# Patient Record
Sex: Female | Born: 1960 | Race: Black or African American | Hispanic: No | Marital: Single | Smoking: Former smoker
Health system: Southern US, Community
[De-identification: ages and names within clinical notes are randomized; demographics above are authoritative.]

## PROBLEM LIST (undated history)

## (undated) DIAGNOSIS — M549 Dorsalgia, unspecified: Secondary | ICD-10-CM

## (undated) DIAGNOSIS — G4730 Sleep apnea, unspecified: Secondary | ICD-10-CM

## (undated) DIAGNOSIS — M79605 Pain in left leg: Secondary | ICD-10-CM

## (undated) DIAGNOSIS — J45909 Unspecified asthma, uncomplicated: Secondary | ICD-10-CM

## (undated) DIAGNOSIS — I639 Cerebral infarction, unspecified: Secondary | ICD-10-CM

## (undated) DIAGNOSIS — F329 Major depressive disorder, single episode, unspecified: Secondary | ICD-10-CM

## (undated) DIAGNOSIS — I739 Peripheral vascular disease, unspecified: Secondary | ICD-10-CM

## (undated) DIAGNOSIS — F319 Bipolar disorder, unspecified: Secondary | ICD-10-CM

## (undated) DIAGNOSIS — F32A Depression, unspecified: Secondary | ICD-10-CM

## (undated) DIAGNOSIS — G43909 Migraine, unspecified, not intractable, without status migrainosus: Secondary | ICD-10-CM

## (undated) DIAGNOSIS — T7840XA Allergy, unspecified, initial encounter: Secondary | ICD-10-CM

## (undated) DIAGNOSIS — E119 Type 2 diabetes mellitus without complications: Secondary | ICD-10-CM

## (undated) DIAGNOSIS — I10 Essential (primary) hypertension: Secondary | ICD-10-CM

## (undated) DIAGNOSIS — F419 Anxiety disorder, unspecified: Secondary | ICD-10-CM

## (undated) DIAGNOSIS — E7800 Pure hypercholesterolemia, unspecified: Secondary | ICD-10-CM

## (undated) DIAGNOSIS — M1990 Unspecified osteoarthritis, unspecified site: Secondary | ICD-10-CM

## (undated) DIAGNOSIS — D649 Anemia, unspecified: Secondary | ICD-10-CM

## (undated) HISTORY — DX: Anemia, unspecified: D64.9

## (undated) HISTORY — PX: EYE SURGERY: SHX253

## (undated) HISTORY — DX: Peripheral vascular disease, unspecified: I73.9

## (undated) HISTORY — PX: SHOULDER SURGERY: SHX246

## (undated) HISTORY — DX: Pure hypercholesterolemia, unspecified: E78.00

## (undated) HISTORY — DX: Pain in left leg: M79.605

## (undated) HISTORY — PX: KNEE SURGERY: SHX244

## (undated) HISTORY — PX: ABDOMINAL HYSTERECTOMY: SHX81

## (undated) HISTORY — DX: Unspecified osteoarthritis, unspecified site: M19.90

## (undated) HISTORY — DX: Cerebral infarction, unspecified: I63.9

## (undated) HISTORY — DX: Sleep apnea, unspecified: G47.30

## (undated) HISTORY — DX: Dorsalgia, unspecified: M54.9

## (undated) HISTORY — PX: PARTIAL HYSTERECTOMY: SHX80

## (undated) HISTORY — PX: BACK SURGERY: SHX140

## (undated) HISTORY — DX: Allergy, unspecified, initial encounter: T78.40XA

## (undated) HISTORY — DX: Anxiety disorder, unspecified: F41.9

## (undated) HISTORY — PX: OTHER SURGICAL HISTORY: SHX169

---

## 1997-09-01 ENCOUNTER — Emergency Department (HOSPITAL_COMMUNITY): Admission: EM | Admit: 1997-09-01 | Discharge: 1997-09-01 | Payer: Self-pay | Admitting: Emergency Medicine

## 1997-09-02 ENCOUNTER — Encounter: Admission: RE | Admit: 1997-09-02 | Discharge: 1997-12-01 | Payer: Self-pay | Admitting: Internal Medicine

## 1998-03-14 ENCOUNTER — Emergency Department (HOSPITAL_COMMUNITY): Admission: EM | Admit: 1998-03-14 | Discharge: 1998-03-14 | Payer: Self-pay | Admitting: Emergency Medicine

## 1998-05-18 ENCOUNTER — Encounter: Payer: Self-pay | Admitting: Emergency Medicine

## 1998-05-18 ENCOUNTER — Emergency Department (HOSPITAL_COMMUNITY): Admission: EM | Admit: 1998-05-18 | Discharge: 1998-05-18 | Payer: Self-pay | Admitting: Emergency Medicine

## 1998-10-10 ENCOUNTER — Encounter: Payer: Self-pay | Admitting: Emergency Medicine

## 1998-10-10 ENCOUNTER — Inpatient Hospital Stay (HOSPITAL_COMMUNITY): Admission: EM | Admit: 1998-10-10 | Discharge: 1998-10-13 | Payer: Self-pay | Admitting: Emergency Medicine

## 1998-12-11 ENCOUNTER — Inpatient Hospital Stay (HOSPITAL_COMMUNITY): Admission: EM | Admit: 1998-12-11 | Discharge: 1998-12-14 | Payer: Self-pay | Admitting: *Deleted

## 2003-03-03 ENCOUNTER — Ambulatory Visit (HOSPITAL_COMMUNITY): Admission: RE | Admit: 2003-03-03 | Discharge: 2003-03-03 | Payer: Self-pay | Admitting: Internal Medicine

## 2003-09-07 ENCOUNTER — Ambulatory Visit (HOSPITAL_COMMUNITY): Admission: RE | Admit: 2003-09-07 | Discharge: 2003-09-07 | Payer: Self-pay | Admitting: Internal Medicine

## 2005-01-14 DIAGNOSIS — I639 Cerebral infarction, unspecified: Secondary | ICD-10-CM

## 2005-01-14 HISTORY — DX: Cerebral infarction, unspecified: I63.9

## 2005-07-05 ENCOUNTER — Ambulatory Visit: Payer: Self-pay | Admitting: Internal Medicine

## 2005-07-05 ENCOUNTER — Inpatient Hospital Stay (HOSPITAL_COMMUNITY): Admission: EM | Admit: 2005-07-05 | Discharge: 2005-07-12 | Payer: Self-pay | Admitting: Emergency Medicine

## 2005-07-05 ENCOUNTER — Encounter: Payer: Self-pay | Admitting: Cardiology

## 2005-07-05 ENCOUNTER — Ambulatory Visit: Payer: Self-pay | Admitting: Cardiology

## 2005-07-08 ENCOUNTER — Encounter: Payer: Self-pay | Admitting: Cardiology

## 2005-07-10 ENCOUNTER — Encounter: Admission: RE | Admit: 2005-07-10 | Discharge: 2005-07-10 | Payer: Self-pay | Admitting: *Deleted

## 2005-07-11 ENCOUNTER — Encounter (INDEPENDENT_AMBULATORY_CARE_PROVIDER_SITE_OTHER): Payer: Self-pay | Admitting: Specialist

## 2005-07-25 ENCOUNTER — Ambulatory Visit: Payer: Self-pay | Admitting: Internal Medicine

## 2005-08-14 ENCOUNTER — Ambulatory Visit: Payer: Self-pay | Admitting: Family Medicine

## 2005-08-19 ENCOUNTER — Ambulatory Visit: Payer: Self-pay | Admitting: *Deleted

## 2005-08-22 ENCOUNTER — Ambulatory Visit: Payer: Self-pay | Admitting: Nurse Practitioner

## 2005-08-28 ENCOUNTER — Ambulatory Visit: Payer: Self-pay | Admitting: Family Medicine

## 2005-09-20 ENCOUNTER — Ambulatory Visit: Payer: Self-pay | Admitting: Family Medicine

## 2005-09-20 ENCOUNTER — Ambulatory Visit (HOSPITAL_COMMUNITY): Admission: RE | Admit: 2005-09-20 | Discharge: 2005-09-20 | Payer: Self-pay | Admitting: Family Medicine

## 2005-09-29 ENCOUNTER — Emergency Department (HOSPITAL_COMMUNITY): Admission: EM | Admit: 2005-09-29 | Discharge: 2005-09-29 | Payer: Self-pay | Admitting: Emergency Medicine

## 2005-10-04 ENCOUNTER — Ambulatory Visit: Payer: Self-pay | Admitting: Family Medicine

## 2005-10-25 ENCOUNTER — Ambulatory Visit: Payer: Self-pay | Admitting: Family Medicine

## 2005-11-19 ENCOUNTER — Inpatient Hospital Stay (HOSPITAL_COMMUNITY): Admission: AD | Admit: 2005-11-19 | Discharge: 2005-11-19 | Payer: Self-pay | Admitting: Family Medicine

## 2005-12-10 ENCOUNTER — Ambulatory Visit (HOSPITAL_BASED_OUTPATIENT_CLINIC_OR_DEPARTMENT_OTHER): Admission: RE | Admit: 2005-12-10 | Discharge: 2005-12-10 | Payer: Self-pay | Admitting: Neurology

## 2005-12-17 ENCOUNTER — Ambulatory Visit: Payer: Self-pay | Admitting: Family Medicine

## 2006-02-22 ENCOUNTER — Emergency Department (HOSPITAL_COMMUNITY): Admission: EM | Admit: 2006-02-22 | Discharge: 2006-02-23 | Payer: Self-pay | Admitting: Emergency Medicine

## 2006-02-25 ENCOUNTER — Ambulatory Visit (HOSPITAL_BASED_OUTPATIENT_CLINIC_OR_DEPARTMENT_OTHER): Admission: RE | Admit: 2006-02-25 | Discharge: 2006-02-25 | Payer: Self-pay | Admitting: Orthopedic Surgery

## 2006-03-04 ENCOUNTER — Encounter: Admission: RE | Admit: 2006-03-04 | Discharge: 2006-05-02 | Payer: Self-pay | Admitting: Orthopedic Surgery

## 2006-03-12 ENCOUNTER — Encounter (INDEPENDENT_AMBULATORY_CARE_PROVIDER_SITE_OTHER): Payer: Self-pay | Admitting: Specialist

## 2006-03-12 ENCOUNTER — Inpatient Hospital Stay (HOSPITAL_COMMUNITY): Admission: RE | Admit: 2006-03-12 | Discharge: 2006-03-15 | Payer: Self-pay | Admitting: Obstetrics

## 2006-04-22 ENCOUNTER — Ambulatory Visit: Payer: Self-pay | Admitting: Gastroenterology

## 2006-05-07 ENCOUNTER — Ambulatory Visit: Payer: Self-pay | Admitting: Gastroenterology

## 2006-07-08 ENCOUNTER — Inpatient Hospital Stay (HOSPITAL_COMMUNITY): Admission: RE | Admit: 2006-07-08 | Discharge: 2006-07-11 | Payer: Self-pay | Admitting: Orthopedic Surgery

## 2006-07-09 ENCOUNTER — Ambulatory Visit: Payer: Self-pay | Admitting: Physical Medicine & Rehabilitation

## 2006-08-04 ENCOUNTER — Encounter: Admission: RE | Admit: 2006-08-04 | Discharge: 2006-08-04 | Payer: Self-pay | Admitting: Family Medicine

## 2006-08-18 ENCOUNTER — Encounter: Admission: RE | Admit: 2006-08-18 | Discharge: 2006-11-16 | Payer: Self-pay | Admitting: Orthopedic Surgery

## 2006-09-22 ENCOUNTER — Encounter: Admission: RE | Admit: 2006-09-22 | Discharge: 2006-12-21 | Payer: Self-pay | Admitting: Gastroenterology

## 2006-10-05 ENCOUNTER — Encounter: Admission: RE | Admit: 2006-10-05 | Discharge: 2006-10-05 | Payer: Self-pay | Admitting: Orthopedic Surgery

## 2006-10-21 ENCOUNTER — Encounter: Admission: RE | Admit: 2006-10-21 | Discharge: 2007-01-19 | Payer: Self-pay | Admitting: Family Medicine

## 2006-11-14 ENCOUNTER — Encounter: Admission: RE | Admit: 2006-11-14 | Discharge: 2006-11-14 | Payer: Self-pay | Admitting: Orthopedic Surgery

## 2006-12-02 ENCOUNTER — Emergency Department (HOSPITAL_COMMUNITY): Admission: EM | Admit: 2006-12-02 | Discharge: 2006-12-02 | Payer: Self-pay | Admitting: Emergency Medicine

## 2007-01-14 ENCOUNTER — Emergency Department (HOSPITAL_COMMUNITY): Admission: EM | Admit: 2007-01-14 | Discharge: 2007-01-14 | Payer: Self-pay | Admitting: Emergency Medicine

## 2007-01-21 ENCOUNTER — Encounter: Admission: RE | Admit: 2007-01-21 | Discharge: 2007-03-24 | Payer: Self-pay | Admitting: Family Medicine

## 2007-04-13 ENCOUNTER — Ambulatory Visit (HOSPITAL_COMMUNITY): Admission: RE | Admit: 2007-04-13 | Discharge: 2007-04-13 | Payer: Self-pay | Admitting: Family Medicine

## 2007-04-23 ENCOUNTER — Inpatient Hospital Stay (HOSPITAL_COMMUNITY): Admission: RE | Admit: 2007-04-23 | Discharge: 2007-04-28 | Payer: Self-pay | Admitting: Orthopedic Surgery

## 2007-04-23 ENCOUNTER — Encounter (INDEPENDENT_AMBULATORY_CARE_PROVIDER_SITE_OTHER): Payer: Self-pay | Admitting: Orthopedic Surgery

## 2007-06-11 ENCOUNTER — Emergency Department (HOSPITAL_COMMUNITY): Admission: EM | Admit: 2007-06-11 | Discharge: 2007-06-11 | Payer: Self-pay | Admitting: Emergency Medicine

## 2007-06-29 ENCOUNTER — Encounter: Admission: RE | Admit: 2007-06-29 | Discharge: 2007-06-29 | Payer: Self-pay | Admitting: Orthopedic Surgery

## 2007-08-06 ENCOUNTER — Encounter: Admission: RE | Admit: 2007-08-06 | Discharge: 2007-08-06 | Payer: Self-pay | Admitting: Family Medicine

## 2007-12-07 ENCOUNTER — Emergency Department (HOSPITAL_COMMUNITY): Admission: EM | Admit: 2007-12-07 | Discharge: 2007-12-07 | Payer: Self-pay | Admitting: Emergency Medicine

## 2008-03-02 ENCOUNTER — Emergency Department (HOSPITAL_COMMUNITY): Admission: EM | Admit: 2008-03-02 | Discharge: 2008-03-02 | Payer: Self-pay | Admitting: Emergency Medicine

## 2008-06-16 ENCOUNTER — Encounter: Admission: RE | Admit: 2008-06-16 | Discharge: 2008-06-16 | Payer: Self-pay | Admitting: Orthopedic Surgery

## 2008-08-29 ENCOUNTER — Emergency Department (HOSPITAL_COMMUNITY): Admission: EM | Admit: 2008-08-29 | Discharge: 2008-08-29 | Payer: Self-pay | Admitting: Emergency Medicine

## 2008-10-30 ENCOUNTER — Emergency Department (HOSPITAL_COMMUNITY): Admission: EM | Admit: 2008-10-30 | Discharge: 2008-10-30 | Payer: Self-pay | Admitting: Emergency Medicine

## 2009-05-27 ENCOUNTER — Emergency Department (HOSPITAL_COMMUNITY): Admission: EM | Admit: 2009-05-27 | Discharge: 2009-05-27 | Payer: Self-pay | Admitting: Emergency Medicine

## 2009-07-12 ENCOUNTER — Ambulatory Visit (HOSPITAL_COMMUNITY): Admission: RE | Admit: 2009-07-12 | Discharge: 2009-07-12 | Payer: Self-pay | Admitting: Obstetrics

## 2009-12-24 ENCOUNTER — Emergency Department (HOSPITAL_COMMUNITY)
Admission: EM | Admit: 2009-12-24 | Discharge: 2009-12-24 | Payer: Self-pay | Source: Home / Self Care | Admitting: Emergency Medicine

## 2010-02-04 ENCOUNTER — Encounter: Payer: Self-pay | Admitting: Orthopedic Surgery

## 2010-03-26 LAB — COMPREHENSIVE METABOLIC PANEL
Albumin: 3.8 g/dL (ref 3.5–5.2)
BUN: 12 mg/dL (ref 6–23)
Creatinine, Ser: 0.75 mg/dL (ref 0.4–1.2)
GFR calc Af Amer: >60 mL/min (ref >60)
GFR calc non Af Amer: >60 mL/min (ref >60)
Sodium: 141 mEq/L (ref 135–145)
Total Protein: 7 g/dL (ref 6.0–8.3)

## 2010-03-26 LAB — DIFFERENTIAL
Basophils Relative: 0 % (ref 0–1)
Eosinophils Absolute: 0.2 10*3/uL (ref 0.0–0.7)
Lymphocytes Relative: 27 % (ref 12–46)
Lymphs Abs: 4 10*3/uL (ref 0.7–4.0)
Neutro Abs: 9.7 10*3/uL — ABNORMAL HIGH (ref 1.7–7.7)

## 2010-03-26 LAB — URINALYSIS, ROUTINE W REFLEX MICROSCOPIC
Hgb urine dipstick: NEGATIVE
Protein, ur: NEGATIVE mg/dL
Urobilinogen, UA: 0.2 mg/dL (ref 0.0–1.0)
pH: 6 (ref 5.0–8.0)

## 2010-03-26 LAB — CBC
HCT: 40.3 % (ref 36.0–46.0)
Hemoglobin: 13.3 g/dL (ref 12.0–15.0)
Platelets: 251 10*3/uL (ref 150–400)
RDW: 15.4 % (ref 11.5–15.5)
WBC: 14.8 10*3/uL — ABNORMAL HIGH (ref 4.0–10.5)

## 2010-03-26 LAB — APTT: aPTT: 28 seconds (ref 24–37)

## 2010-04-03 LAB — DIFFERENTIAL
Eosinophils Absolute: 0.2 10*3/uL (ref 0.0–0.7)
Lymphocytes Relative: 20 % (ref 12–46)
Lymphs Abs: 4 10*3/uL (ref 0.7–4.0)
Monocytes Relative: 7 % (ref 3–12)
Neutrophils Relative %: 73 % (ref 43–77)

## 2010-04-03 LAB — POCT I-STAT, CHEM 8
BUN: 14 mg/dL (ref 6–23)
Chloride: 104 mEq/L (ref 96–112)
HCT: 40 % (ref 36.0–46.0)
Sodium: 139 mEq/L (ref 135–145)
TCO2: 25 mmol/L (ref 0–100)

## 2010-04-03 LAB — CBC
HCT: 36.1 % (ref 36.0–46.0)
MCV: 85.7 fL (ref 78.0–100.0)
RBC: 4.21 MIL/uL (ref 3.87–5.11)
WBC: 20.6 10*3/uL — ABNORMAL HIGH (ref 4.0–10.5)

## 2010-05-01 LAB — CBC
HCT: 38.1 % (ref 36.0–46.0)
MCHC: 32 g/dL (ref 30.0–36.0)
MCV: 88 fL (ref 78.0–100.0)
RBC: 4.33 MIL/uL (ref 3.87–5.11)
WBC: 8.9 10*3/uL (ref 4.0–10.5)

## 2010-05-01 LAB — DIFFERENTIAL
Basophils Absolute: 0 10*3/uL (ref 0.0–0.1)
Lymphocytes Relative: 32 % (ref 12–46)
Lymphs Abs: 2.8 10*3/uL (ref 0.7–4.0)
Neutro Abs: 5.4 10*3/uL (ref 1.7–7.7)
Neutrophils Relative %: 61 % (ref 43–77)

## 2010-05-01 LAB — COMPREHENSIVE METABOLIC PANEL
BUN: 12 mg/dL (ref 6–23)
CO2: 20 mEq/L (ref 19–32)
Calcium: 8.7 mg/dL (ref 8.4–10.5)
Chloride: 115 mEq/L — ABNORMAL HIGH (ref 96–112)
Creatinine, Ser: 0.6 mg/dL (ref 0.4–1.2)
GFR calc Af Amer: >60 mL/min (ref >60)
GFR calc non Af Amer: >60 mL/min (ref >60)
Glucose, Bld: 91 mg/dL (ref 70–99)
Total Bilirubin: 0.7 mg/dL (ref 0.3–1.2)

## 2010-05-29 NOTE — Op Note (Signed)
Autumn Valenzuela, Autumn Valenzuela                ACCOUNT NO.:  [id number]   MEDICAL RECORD NO.:  [id number]          PATIENT TYPE:  INP   LOCATION:  5008                         FACILITY:  [facility]   PHYSICIAN:  Nelda Severe, MD      DATE OF BIRTH:  Apr 01, 1960   DATE OF PROCEDURE:  04/23/2007  DATE OF DISCHARGE:                               OPERATIVE REPORT   SURGEON:  Nelda Severe, M.D.   ASSISTANT:  Lianne Cure, P.A.-C.   PREOPERATIVE DIAGNOSIS:  Lumbar spondylosis and foraminal stenosis with  disc protrusion L5-S1.   POSTOPERATIVE DIAGNOSIS:  Lumbar spondylosis and foraminal stenosis with  disc protrusion L5-S1.   OPERATIVE PROCEDURE:  Bilateral lumbar laminectomy and facetectomy;  posterior interbody fusion with autogenous local graft; insertion of  interbody cage by the left transforaminal approach (9 mm thick);  insertion of pedicle screws and rods L5 and S1 bilaterally;  posterolateral fusion L5-S1 with autogenous local bone graft; harvest  local autogenous graft; injection of 0.75 mg of preservative free  morphine intrathecally at L3-L4.   PROCEDURE NOTE:  The patient was placed under general endotracheal  anesthesia.  A Foley catheter was placed in the bladder.  Intravenous  antibiotics had been administered intravenously.  Bilateral sequential  compression devices were placed on her lower extremities.  She was  positioned prone on the [place] frame.  Care was taken to position the  upper extremities so as to avoid hyperflexion and abduction of the  shoulders and so as to avoid hyperflexion of the elbows.  Foam padding  was placed from axilla to hands.  The hips and knees were gently flexed  and supported on pillows.  The lateral buttocks were painted with  tincture of Benzoin and wide adhesive tape attached and anchored  distally on the table to pull the adipose tissue of her buttocks  distally so that they would not overhang the incisional area.  We then  prepped the  lumbosacral area with DuraPrep and draped in a rectangular  fashion.  The drapes were secured with Ioban.   The skin was scored in the midline over the lower lumbosacral spine.  The subcutaneous tissue was injected with a mixture of plain 0.25%  Marcaine and 1% lidocaine with epinephrine.  The incision was then  deepened through the dermis and adipose layer (about 2-3 cm in depth) to  the spinous processes of the distal lumbar spine.  Paraspinal muscles  were mobilized bilaterally.  The last mobile segment was identified.  A  Kocher clamp was placed on what was perceived to be the trailing edge of  the L5 vertebra and which proved to be on the cross table lateral  radiograph.   We then proceeded to perform a bilateral laminectomy and bilateral  facetectomy.  I had initially considered doing a bilateral posterior  interbody fusion and, therefore, laminectomy and discectomy was  performed on both sides, rather than just the left side in which there  was foraminal stenosis and which corresponded with the patient's left  leg pain.   Next, we made pedicle holes bilaterally at L5 and S1.  These were done  while directly palpating the pedicles having performed the wide  laminectomy.  Radiopaque markers were placed after injecting the holes  with Gelfoam.  We then took a cross table lateral radiograph and it was  apparent that the left S1 marker was in the disc space.  I then again  palpated the pedicle hole and the fact was that the superior portion of  the hole did appear to be soft.  Therefore, the hole was redirected.  Another cross table lateral radiograph showed better position of the  markers.   In the meantime, while waiting for x-rays to be processed, we began a  left sided discectomy at L5-S1.  There was a great deal of epidural fat  and a great amount of epidural veins which were bipolar coagulated.  The  S1 nerve root origin was identified and the S1 nerve root origin and  dura  retracted medially.  The exiting L5 nerve root was protected with a  cottonoid patty.  The disc internally was very fragmented and friable.  The disc was enucleated by repeated curettage of the endplates and  extrication of fragments with pituitary rongeurs.  Ultimately, I felt we  prepared the endplates well.   At this point, bone from the lamina had been morcellized and prepared  for graft.  We then decorticated the ala, sacrum, and transverse process  on the patient's right side and packed satisfactory quantity of graft  posterolaterally.  We then placed the S1 screw and L5 screw on the right  side.  The holes were tapped.  Screw lengths were based upon depth  measurements of the holes and 7.5 mm diameter screw was used at the S1  and 6.5 mm diameter at L5.  These were Theken screws.  We then  provisionally attached a working rod which we could use for  maintenance of distraction of the disc space.   Next, I decorticated the ala, sacrum, and transverse process of L5 on  the left side and packed more graft posterolaterally.  Again, screws  were placed after tapping the holes at L5 and S1, 7.5 mm diameter at S1  and 6.5 mm diameter at L5, depths based on depth measurements used to  determine screw length.  We then placed an intradiscal paddle type  distractor to 11 mm and placed another rod on the left side and  tightened it to hold the distraction and the paddle was then removed.  A  little more curettage was carried out.   During this maneuver, a down pushing curette did penetrate the anterior  annulus, probably no more than a few mm to 1 cm.  There was a little bit  of venous back bleeding.  I placed Gelfoam in the disc space and removed  it after a few minutes and there was no more bleeding.  The total amount  of blood coming from the disc space after penetrating the annulus was no  more than 15 and 20 mL, I would think.  I spoke with anesthetist at that  point and the patient did  not have any change in her vital signs or  blood pressure at that time, or later through the case.  I alerted them  as to what had happened and concluded that no serious extradiscal injury  had occurred.  This is in an area which was central and between the  great vessels and unlikely to have resulted in any vascular injury.   Next, we used  a special funnel to insert morcellized bone graft  anteriorly in the disc space.  The 9 mm thick kidney shaped cage was  then inserted and impacted into place anteriorly in the disc and in the  midline.  The set screws on the coupling devices were released and there  was collapse of the disc space onto the cage.   Next, we changed out the working rod to a new rod on the right side and  compressed the construct.  A cross table lateral radiograph was taken at  this juncture and the appearance was such that I thought that the left  S1 screw was in the disc space.  I actually removed it, carefully  palpated the hole, and confirmed that it did not appear to be in the  disc space.  There appeared to be circumferential bone around the hole.  The screw was then reinserted and the rod re-coupled and torqued on the  left side.  Care was taken to inspect the rod to make sure there was rod  protruding at each end of the construct on both sides and there was.  The remaining bone graft was packed in posterolaterally on either side.   A 15 gauge Blake drain was placed subfascially and secured to the skin  on the right side with 2-0 nylon suture.  The thoracolumbar fascia was  closed using continuous #1 Vicryl suture.  A 1/8 inch Hemovac drain was  placed subcutaneously and brought out through the skin to the right  side.  It was secured with a 2-0 nylon suture, as well.  The  subcutaneous layer was closed using interrupted inverted 2-0 Vicryl  suture.  The skin was closed using a subcuticular 3-0 undyed Vicryl  suture in continuous fashion.  The skin edges were  reinforced with Steri-  Strips.  An antibiotic ointment dressing was applied and secured with  OpSite.   The patient was stable throughout the procedure.  There were no  intraoperative complications other than the penetration through the  anterior annulus which did not appear to result in any harm.  The sponge  and needle counts were correct.  The patient was taken off the table,  awakened, and returned to the recovery room in satisfactory stable  condition.  In the recovery room, she is comfortable.  She has no leg  pain.  She has good dorsiflexion and plantar flexion of both feet and  ankles.  Not mentioned above is the fact that about half an hour before  the conclusion the procedure, we injected 0.75 mg of morphine  (preservative free) intrathecally at the L3-L4 level.      Nelda Severe, MD  Electronically Signed     MT/MEDQ  D:  04/23/2007  T:  04/23/2007  Job:  [id number]

## 2010-05-29 NOTE — Discharge Summary (Signed)
Autumn Valenzuela, Autumn Valenzuela                ACCOUNT NO.:  [id number]   MEDICAL RECORD NO.:  [id number]          PATIENT TYPE:  INP   LOCATION:  1606                         FACILITY:  [facility]   PHYSICIAN:  Deidre Ala, M.D.    DATE OF BIRTH:  16-May-1960   DATE OF ADMISSION:  07/08/2006  DATE OF DISCHARGE:  07/11/2006                               DISCHARGE SUMMARY   FINAL DIAGNOSES:  1. Degenerative joint disease, left knee.  2. History of stroke.  3. Hypertension.  4. Depression.  5. History of chronic anemia.  6. Postoperative blood-loss anemia, stable.   PROCEDURE:  On July 08, 2006, left total knee arthroplasty.  Surgeon,  Dr. Charlesetta Shanks.   HISTORY:  This is a 50 year old African-American female who has been  followed by Dr. Renae Fickle for the past week.  She has had chronic left knee  pain for the last 3-6 months.  She has not shown any improvement.  She  is wanting total knee arthroplasty for correction.  She has pain  chronically, pain at night, cannot walk a lot without stopping.   HOSPITAL COURSE:  Patient is admitted to [facility] on July 08, 2006.  At that time, she had a total left knee arthroplasty.  Patient tolerated the procedure well with no intraoperative  complications.  Postoperatively, the patient did well.  Lab work was  done daily for the first few days.  Her blood-loss anemia remained  stable.  At the time of discharge, her hemoglobin was 9.2 with a  hematocrit of 27.7.  Her white cell count was 12.5.  Her platelets  216,000.  She is doing well.  She has received no blood products while  she was in the hospital.  She stopped working with physical therapy but  was doing quite well at the time of discharge.  She was able to get up  and get to the restroom, typically by herself without any assistance.  The patient was doing satisfactorily.  No signs of infection.  Staples  were placed at the time of discharge.  Her peripheral pulses were  intact.  Her neuro  was grossly intact.   DISCHARGE MEDICATIONS:  1. Percocet 1-2 p.o. q.4-6h. p.r.n. pain, 5/325.  She was given 50 of      these.  2. She is to continue on Lovenox 30 mg subcu q.12h.  She has been      taking this since surgery for deep venous thrombosis prophylaxis.  3. She continues on ferrous sulfate 325 mg t.i.d.  She was taking at      home.  4. She is taking Lipitor 40 mg daily.  5. Prozac 20 mg daily.  6. Topamax 50 mg b.i.d.  7. Zanaflex 4 mg b.i.d.  8. Xanax 0.5 mg b.i.d.  9. Norvasc 10 mg daily.  10.Diovan 160 mg daily.  11.Flovent 3 puffs nightly.   She has an allergy to __________ .   PHYSICAL EXAMINATION AT DISCHARGE:  GENERAL:  Patient is well-developed  and well-nourished, alert and oriented.  HEENT:  NCAT.  EOMI.  PERRL.  Oropharynx clear.  NECK:  Supple without JVD or lymphadenopathy.  __________ .  CHEST:  __________ .  Clear to auscultation.  No wheezing, rhonchi, or  rales.  CV:  Regular rate and rhythm without murmur, rub or gallop.  ABDOMEN:  Soft, __________ .  She has no hernias.  GU/RECTAL:  Deferred.  EXTREMITIES:  Without clubbing, cyanosis or edema.  Peripheral pulses  are intact.  Neurovascular intact.  NEURO:  __________  any deficits __________ .   Patient is stable for discharge to home in satisfactory, stable  condition.      Phineas Semen, P.A.    ______________________________  Seth Bake. Charlesetta Shanks, M.D.    CL/MEDQ  D:  07/11/2006  T:  07/11/2006  Job:  [id number]

## 2010-05-29 NOTE — Discharge Summary (Signed)
Autumn Valenzuela, Autumn Valenzuela                ACCOUNT NO.:  [id number]   MEDICAL RECORD NO.:  [id number]           PATIENT TYPE:   LOCATION:                                 FACILITY:   PHYSICIAN:  Lianne Cure, P.A.  DATE OF BIRTH:  December 01, 1960   DATE OF ADMISSION:  04/23/2007  DATE OF DISCHARGE:                               DISCHARGE SUMMARY   HISTORY OF PRESENT ILLNESS:  She was admitted to [facility] on  April 23, 2007, secondary to diagnosis of lumbar spondylosis, foraminal  stenosis, disc herniation L5-S1, for surgery with  fusion L5-S1 with  insertion of rods and pedicle screws.  Postoperatively, the patient was  stable.  She had blood loss of 400 mL.  She was grossly alert and moving  both lower extremities well.  Postoperatively, she was taken to room  5008.  Physical therapy was ordered.  Formula meal again started on  postop day 1.  Incision was clean and dry.  Drain output was 250 mL,  total from surgery.  Drains were intact.  She had a T-max at 99.4.  Vitals signs were stable.  BMET within normal limits.  She has known  decreased sensation right lower extremity compared to left secondary to  a stroke.  EHL was 4-/5.  Anterior tib 5/5.  We are waiting for her to  pass flatulence.  If she does, she can advance her diet.  Postop day 2,  all vitals were stable.  Drain output 50 mL.  We discontinued the drain.  The incision is clean and dry and intact.  A clean and dry dressing was  applied.  The patient tolerated this well.  She was in her TED hose.  Calves were soft and nontender.  We discontinued IV fluids, discontinued  PCA, increased clear liquid intake, and no flatulence passed yet.  Foley  was also discontinued.  The patient was ambulating in the room with  standby assistance/minimal assistance with therapy.  Postop day 3, her  mobility improved somewhat.  She was stable overall.  Dressing  maintained clean and dry.  We did order Reglan 10 mg p.o. today, on  April 28, 2007.  She did have a bowel movement yesterday.  She has good  bowel sounds though it is soft.  She is afebrile.  Her vital signs are  stable.  Moving lower extremities well.  Incision is healing well.  No  abrasive skin or active drainage.   DISCHARGE DIAGNOSES:  Lumbar spondylosis with stenosis L5-S1 secondary  to herniated disc, status post fusion at that level.   The plan is for her to walk or exercise.  No dressing is needed.  We do  not want her to do any bending, stooping, squatting, or lifting.  No  strenuous house work.   DISCHARGE MEDICATIONS:  We will send her home on 2 prescriptions:  1. Norco 10/325, #120 with 1 refill, 1-2 q.4.  2. Robaxin 500 mg 1-2 q.6 p.r.n. for muscle spasms.   We want her to refrain from taking any blood pressure medications, until  she follows up  with her regular doctor.  Her blood pressure has been  well maintained without medication needs.  She is going to continue and  follow her presurgery medications to include:  1. Topamax 50 mg b.i.d.  2. Prozac 20 mg daily.  3. Xanax 0.5 b.i.d.  4. Lipitor 40 mg once daily.  5. Ambien at bedtime for sleep.  6. ReQuip 0.5 at bedtime.  7. She is going to hold on her Norvasc, which is only 5 mg daily.  8. Avapro 300 mg daily.  9. She is going to take vitamin D 50,000 units daily, to complete 7      days worth and then she will try to taper for 7 weeks and then      monthly.  We are going to call a script for that for home.   FOLLOWUP:  She is going to follow up with Dr. Alveda Reasons in 4 weeks from now.  If she has troubles or concerns, she is welcome to call our office.      Lianne Cure, P.A.     MC/MEDQ  D:  04/28/2007  T:  04/29/2007  Job:  [id number]

## 2010-05-29 NOTE — Op Note (Signed)
NAMESEHAM, GARDENHIRE                ACCOUNT NO.:  [id number]   MEDICAL RECORD NO.:  [id number]          PATIENT TYPE:  INP   LOCATION:  1606                         FACILITY:  [facility]   PHYSICIAN:  Deidre Ala, M.D.    DATE OF BIRTH:  1960-05-03   DATE OF PROCEDURE:  07/08/2006  DATE OF DISCHARGE:                               OPERATIVE REPORT   PREOPERATIVE DIAGNOSIS:  End-stage degenerative joint disease, left  knee, with varus.   POSTOPERATIVE DIAGNOSIS:  End-stage degenerative joint disease, left  knee, with varus.   PROCEDURE:  Left total knee arthroplasty using cemented DePuy  components, LCS type, with rotating platform with long-stem tibia, MBT,  for high body mass index.   SURGEON:  1. Charlesetta Shanks, M.D.   ASSISTANT:  Phineas Semen, P.A.-C   ANESTHESIA:  General endotracheal with femoral nerve block.   CULTURES:  None.   DRAINS:  Two medium Hemovacs to self-suction.   ESTIMATED BLOOD LOSS:  100 mL.  Replaced:  Without.   TOURNIQUET TIME:  91 minutes.   PATHOLOGIC FINDINGS AND HISTORY:  Raidyn came in for left knee pain.  She  had a stroke many years ago affecting her right side.  Worked standing  for many years.  Left knee pain especially with walking.  She presented  the first of the year.  X-rays were consistent with narrowing of the  medial joint space, varus and medial osteophyte.  She underwent  cortisone injection.  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  Because she did not make  progress, we took her to the operating room and she underwent  debridement, lateral retinacular release, plica excision and abrasion  ablation chondroplasty.  At first her knee pain improved but then she  continued to have pain with walking.  We started Hyalgan injections and  actually Euflexxa.  She continued to have discomfort with persistent  pain and varus,   x-ray showed further medial joint line narrowing with  medial osteophyte.  At this point she was miserable.  She was 4 months  from her scope.  She says the knee catches, primarily just hurts when  she walks, aching medial joint line, medial tibial plateau,  retropatellar.  Level pain 8-9.  She has to walk on her tiptoes using a  cane.  Pain with every step, night pain, and cannot walk more than a  city block.  We elected to proceed with surgical intervention with a  total knee replacement and using long MBT stem due to her high body mass  index and anticipated long years of tibial wear.  At surgery she had a  much more severe medial compartment disease than was apparent on her  scope with some scalloping posteriorly of the posterior medial tibial  plateau, where she nearly bone into bone.  This did not show up well on  her x-rays and, in fact, she did have disease on her knee scope, but  this clearly had either worsened or was not as apparent  when we did her  scope.  She had also retropatellar DJD.  We ended up fitting her with a  standard left femur, a standard 12.5-mm insert and a size 3 MBT revision  tibial tray, a 16 x 75 stem and a 32-mm all-poly patella button.  We  used tobramycin in the cement.  We had full extension and closure with  flexion to about 100 degrees with no anterior drawer instability and no  varus-valgus instability, with overall good alignment and position.   PROCEDURE:  With adequate anesthesia obtained using endotracheal  technique after a femoral nerve block, the patient was placed in the  supine position.  The left lower extremity was prepped from the toes to  the tourniquet in the standard fashion.  After standard prepping and  draping, Esmarch exsanguination was used.  The tourniquet let up to 375  mmHg.  A median parapatellar skin incision was followed by a median  parapatellar retinacular incision.  Incision was deepened sharply with a  knife and  hemostasis obtained using the Bovie electrocoagulator.  A thin  flap was then developed over the patella and the patella was everted.  The fat pad was excised as well as both menisci completely and the  cruciates.  Retractors were placed and the anterior tibial spine was  amputated.  I then placed an intermedullary guide and reamed up to a 16,  placing then the tibial cutting jig in place.  I then cut with it slight  a varus cant to effect leg valgus but fitting along the intermedullary  guide axis, made that cut.  I then sized her with the anterior-posterior  femoral cutting jig and felt it would be too tight, so I cut 5 more  millimeters on the tibia with the jig.  I then placed anterior-posterior  femoral cutting jig in place with the C clamp set on 15.  I pinned it,  then set the rotation, made the anterior-posterior cut, then placed the  C clamp in with 12.5, which was good balance in flexion.  I then placed  the 4-degree valgus distal femoral cutting jig in place and set it so  that we cut it from the first reading.  I made that cut.  It was tight  with a 12.5 in extension,  so we cut 2.5 more millimeters and then we  fit 12.5 in flexion and extension.  We then placed the finishing guide  on the distal femur, made the anterior-posterior chamfer cuts as well as  the notch cut.  We then exposed the proximal tibia, sized to a 3.  We  then drilled the conical reamer and then the 16 x 75 stem with conical  reamer down flush.  We then trialled a size 3 MBT revision tibial tray  with a 16 x 75 stem and impacted it down.  We then did the keel punch  and then placed the 12-mm insert.  We then placed on the femoral  component and articulated the knee through a full range of motion with  full extension and full flexion, with no significant instability in  flexion.  At this point we callipered the patella from and measured it  to measure 24, used the cutting guide to cut it down to a 16,  then placed a three-peg drill guide and drilled the three holes for the  patella, placed the trial patella in place, and then articulated the  knee through a range of motion.  I used towel  clamps on the anterior  retinaculum and checked the knee in flexion with no significant anterior  drawer.  All trial components were then removed while the implantable  components were checked for sizing as they came on the field.  Thorough  jet lavage was carried out of the cut surfaces and then the cement was  mixed with tobramycin in a cement gun.  We then exposed the proximal  tibia after having assembled the tibial stem on the MBT revision tibial  tray.  We then cemented down the tibial component and we used cement  from the tray to the flutes but not past, impacted it, and removed  excess cement.  We then placed the rotating-platform, 12.5.  We then  cemented on the femoral component, impacted it, removed excess cement,  held the knee in full extension, impacted, removed the excess cement,  and then held it in about 30 degrees of flexion while the cement cured.  We then cemented on the patella component and impacted it, removed  excess cement, and held it with a clamp until the cement had cured.  Additional jet lavage was carried out and the leg was held until the  cement cured.  When it was, the tourniquet was let down.  Bleeding  points were cauterized.  FloSeal 10 mg was then injected into the wound  and spread.  We then placed a Hemovac drain in the medial and lateral  gutters and brought it out the superior lateral portal.  The wound was  then closed in layers with #1 Vicryl,  #1 Vicryl on the retinaculum with  an oversew of running locking #1 PDS.  We then closed the subcu with 0,  2-0 and 3-0 Vicryl and then skin staples.  Hemovac was hooked up to self-  suction.  A bulky sterile compressive dressing was applied with ice pack  and knee immobilizer.  The patient, having tolerated the  procedure well,  was awakened, taken to the recovery room in satisfactory condition for  routine postoperative care, CPM and analgesia.          ______________________________  V. Charlesetta Shanks, M.D.    VEP/MEDQ  D:  07/08/2006  T:  07/09/2006  Job:  [id number]

## 2010-06-01 NOTE — Op Note (Signed)
Autumn Valenzuela, Autumn Valenzuela                ACCOUNT NO.:  [id number]   MEDICAL RECORD NO.:  [id number]          PATIENT TYPE:  INP   LOCATION:  3025                         FACILITY:  [facility]   PHYSICIAN:  Wilmon Arms. Corliss Skains, M.D. DATE OF BIRTH:  07/02/1960   DATE OF PROCEDURE:  07/11/2005  DATE OF DISCHARGE:                                 OPERATIVE REPORT   PREOPERATIVE DIAGNOSIS:  Lipoma back.   POSTOPERATIVE DIAGNOSIS:  Lipoma back.   PROCEDURE PERFORMED:  Excision lipoma back.   SURGEON:  Wilmon Arms. Tsuei, M.D.   ANESTHESIA:  General.   INDICATIONS:  The patient is a female who was hospitalized for an embolic  stroke.  During her hospitalization, she was noted to have a very large mass  on her back.  The patient feels like this has only been there for several  months, but family members feel it has been there for a couple of years.  It  is painful and tender.  We are consulted for excision and biopsy.   DESCRIPTION OF PROCEDURE:  The patient was brought to the operating and  placed in a supine position on a stretcher.  After an adequate level of  general anesthesia was obtained, she was rotated to a prone position with  appropriate padding.  Her back was prepped with Betadine and draped in a  sterile fashion.  A time out was taken to assure the proper patient and  proper procedure.  A transverse incision was made over the mass.  Cautery  was then used to dissect a large lipoma out of the subcutaneous tissues.  The lipoma was densely adherent down to the posterior fascia.  Once the  lipoma was completely excised, it was sent for pathologic examination.  The  wound was irrigated.  Hemostasis was obtained with cautery.  The wound was  closed with a deep layer of 3-0 Vicryl and a subcuticular layer of 4-0  Monocryl.  Steri-Strips and clean dressings were applied. The patient was  extubated and brought to the recovery room in stable condition.  All sponge,  instrument, and needle counts  were correct.      Wilmon Arms. Tsuei, M.D.  Electronically Signed     MKT/MEDQ  D:  07/11/2005  T:  07/11/2005  Job:  [id number]

## 2010-06-01 NOTE — Assessment & Plan Note (Signed)
[facility]                         GASTROENTEROLOGY OFFICE NOTE   Autumn Valenzuela, Autumn Valenzuela                       MRN:          [id number]  DATE:04/22/2006                            DOB:          02-02-1960    REASON FOR CONSULTATION:  Rectal bleeding.   Autumn Valenzuela is a pleasant, 50 year old, African American female referred  through the courtesy of Dr. Bruna Potter for evaluation.  She has been  complaining of progressive constipation.  She has seen bright red blood  mixed with her stools and on the toilet tissue.  She has crampy lower  abdominal pain and has had some rectal pain with defecation as well.  Constipation is becoming a worsening problem.  She has occasional  indigestion.   PAST MEDICAL HISTORY:  Pertinent for hypertension and asthma.  She is  status post hysterectomy a month ago.  She also has a history of a CVA.   FAMILY HISTORY:  Mother with diabetes.   MEDICATIONS:  Hydroxy progesterone, fluoxetine, hydrocodone p.r.n.,  iron, dicyclomine, Xanax, and Exforge.   ALLERGIES:  CEFAZOLIN (anaphylactic shock)   She neither smokes nor drinks.  She is single and currently unemployed.   REVIEW OF SYSTEMS:  Positive for her feet swelling, frequent cough,  night sweats, excess thirst, headaches, and excessive urination.   PHYSICAL EXAMINATION:  VITAL SIGNS:  Pulse 80.  Blood pressure 122/72.  Weight 221.  HEENT: EOMI. PERRLA. Sclerae are anicteric.  Conjunctivae are pink.  NECK:  Supple without thyromegaly, adenopathy or carotid bruits.  CHEST:  Clear to auscultation and percussion without adventitious  sounds.  CARDIAC:  Regular rhythm; normal S1 S2.  There are no murmurs, gallops  or rubs.  ABDOMEN:  Bowel sounds are normoactive.  Abdomen is soft, non-tender and  non-distended.  There are no abdominal masses, tenderness, splenic  enlargement or hepatomegaly.  EXTREMITIES:  Full range of motion.  No cyanosis, clubbing or edema.  RECTAL:   Deferred.   On April 01, 2006, hemoglobin 10.8, MCV 84, RDW 17.   IMPRESSION:  1. Limited rectal bleeding.  This certainly may be due to hemorrhoids.      More proximal gastrointestinal bleeding source ought to be ruled      out.  2. Chronic constipation.   RECOMMENDATION:  1. Begin fiber supplementation with Metamucil daily.  2. Begin lactulose 15 mL b.i.d.  3. Colonoscopy.     Barbette Hair. Arlyce Dice, MD,FACG  Electronically Signed    RDK/MedQ  DD: 04/22/2006  DT: 04/23/2006  Job #: [id number]   cc:   Burtis Junes, MD

## 2010-06-01 NOTE — Consult Note (Signed)
Autumn Valenzuela, Autumn Valenzuela                ACCOUNT NO.:  [id number]   MEDICAL RECORD NO.:  [id number]          PATIENT TYPE:  INP   LOCATION:  3025                         FACILITY:  [facility]   PHYSICIAN:  Wilmon Arms. Corliss Skains, M.D. DATE OF BIRTH:  1960-12-10   DATE OF CONSULTATION:  07/09/2005  DATE OF DISCHARGE:                                   CONSULTATION   CONSULTING SURGEON:  Wilmon Arms. Valenzuela, M.D.   PRIMARY CARE PHYSICIAN:  [facility].   ADMITTING SERVICE:  Financial risk analyst.   NEUROLOGIST:  Deanna Artis. Sharene Skeans, M.D.   REASON FOR CONSULTATION:  Suspected lipoma on upper back.   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  No definite source for the emboli have been identified.  Subsequently, the patient was found to have a soft mass on the upper  thoracic posterior region on the right.  The patient states that this area  started like a mosquito bite several months ago.  It was initially found by  her daughter.  Each time the daughter would see the patient with her shirt  either off while assisting her to dress or would look at the area, she  noticed that this area was increasing in size.  The patient states this area  is now slightly tender.  Internal medicine has asked surgery to evaluate the  area because they are concerned that this could either be a lipoma, or, more  concerning a liposarcoma, which, given the patient's recent embolic CVA,  could explain the patient's stroke.  They are asking for surgical evaluation  for biopsy.   REVIEW OF SYSTEMS:  As above.  She has had no drainage from this mass on her  back.  Post CVA, she says that her vision is still sort of funny.  She has  occasional diplopia if she moves her head too quickly.  She still has some  dizziness upon standing, which resolves if she stands for a while, and at  times her gait is still mildly  unsteady.   PAST MEDICAL HISTORY:  1.  Menometrorrhagia.  2.  Anemia.  3.  Recent embolic CVA.   FAMILY HISTORY:  Noncontributory.   SOCIAL HISTORY:  Divorced.  She is a former tobacco user.  No alcohol.  Her  urine drug screen was positive for THC and opiates.  She admitted to the  teaching service that she did use marijuana prior to admission.   ALLERGIES:  No known drug allergies.   CURRENT MEDICATIONS:  1.  Full dose aspirin.  2.  Lipitor.  3.  Protonix.  4.  Iron sulfate.  5.  P.R.N. medications.   PHYSICAL EXAMINATION:  GENERAL:  Pleasant, articulate female complaining of  soreness in the mass in her upper back.  VITAL SIGNS:  Temperature 97.4, blood 128/79, pulse 80 and regular,  respirations 20.  NEUROLOGIC:  The patient is alert and oriented x3, moving all extremities  x4.  Cranial nerves II-XII are intact, except  as noted with visual  disturbances as described.  She does have mild gait disturbance as  described.  I did not ask the patient to ambulate.  HEENT:  Normocephalic.  Sclerae not injected.  She has a mildly exophthalmic  appearance to her eyes.  CHEST:  Bilateral lung exams are clear to auscultation.  Respiratory effort  is non-labored.  She is on room air.  CARDIAC:  S1 and S2.  No rubs, murmurs, thrills or gallops.  Pulses regular.  ABDOMEN:  Obese, soft, nontender.  Bowel sounds are present.  EXTREMITIES:  Symmetrical in appearance without edema, cyanosis or clubbing.  SKIN:  There is a soft, non-erythematous, non-indurated mass on the right  posterior back around T8 or T9.  The estimated area is about 4 x 5 cm,  somewhat oval shaped, mobile, mildly tender.  There is no central area that  would suggest drainage or a head.   LABORATORY DATA:  Creatinine of 0.7, potassium 3.7, white count 9500.  On  admission, her white count was 13,000, hemoglobin 8.6.  TSH is 0.759.   DIAGNOSTICS:  No imaging of this area has been obtained.   IMPRESSION:  1.  Soft  mass in the posterior thoracic region.  2.  Recent cerebrovascular accident, embolic per MRI and MRA.  3.  Leukocytosis, resolved.  4.  Anemia, chronic, with associated iron deficiency.  5.  Morbid obesity.   PLAN:  1.  Differential includes lipoma versus liposarcoma versus inclusion cyst.      Given the fact that this area has increased somewhat rapidly in size      over several months, and it started as a small, tiny superficial skin      lesion, I think this is probably an inclusion cyst.  2.  Dr. Corliss Skains will evaluate this area.   Addendum:  Upon further questioning, this mass has slowly been enlarging  over the last two years.  It feels like a simple lipoma.  Recommend excision  in the OR.      Autumn Valenzuela, M.D.  Electronically Signed    ALE/MEDQ  D:  07/09/2005  T:  07/09/2005  Job:  [id number]   cc:   Deanna Artis. Sharene Skeans, M.D.  Fax: [phone]

## 2010-06-01 NOTE — Op Note (Signed)
NAMELOUISIANA, SEARLES                ACCOUNT NO.:  [id number]   MEDICAL RECORD NO.:  [id number]          PATIENT TYPE:  INP   LOCATION:                                FACILITY[facility]  [facility]   PHYSICIAN:  Kathreen Cosier, M.D.DATE OF BIRTH:  01-Jan-1961   DATE OF PROCEDURE:  03/12/2006  DATE OF DISCHARGE:                               OPERATIVE REPORT   PREOPERATIVE DIAGNOSES:  Myoma uteri and prolapsing myoma.   POSTOPERATIVE DIAGNOSES:  Not given.   SURGEON:  Kathreen Cosier, M.D.   FIRST ASSISTANT:  Charles A. Clearance Coots, M.D.   DESCRIPTION OF PROCEDURE:  The patient placed on the operating table in  the lithotomy position. Abdomen, perineum and vagina prepped and draped,  bladder emptied with a straight catheter, speculum placed in the vagina.  It was noted there was a myoma protruding from the cervical os. Using a  double-edge tenaculum, this was twisted up in its entirety and measured  7 x 3 cm in its widest point. A transverse suprapubic incision made  through the old scar, carried down to the rectus fascia, fascia cleaned  and incised the length of the incision. Recti muscles retracted  laterally. On entering the abdomen, it was noted that there was a large  amount of adhesions between the uterus, omentum, and anterior abdominal  wall. There were also multiple myomas from the uterus. Using blunt and  sharp dissection, the uterus was separated from the anterior abdominal  wall, the ovaries were normal. She had a previous tubal ligation. The  anatomy was distorted and the right round ligament grasped with a Kelly  clamp, suture ligated with #1 chromic. The procedure done in a similar  fashion. The bladder was pushed down off the uterus and cervix. The  right round ligament was grasped with a Kelly clamp, suture ligated with  #1 chromic. The uterine vessels skeletonized bilaterally, double clamped  with Heaney clamps on the right and suture ligated x2. Procedure done in  a similar  fashion on the other side. At this point using the scalpel,  the uterus was separated from the cervix. It was noted that the cervix  had been thinned out extremely because of the presence of that  prolapsing myoma and it was decided a supracervical hysterectomy would  be performed only. On the vaginal vault with interrupted sutures of #1  chromic, hemostasis was satisfactory. Blood loss was 150 mL. The abdomen  was closed in layers, peritoneum continuous suture of #0 chromic, fascia  continuous suture of #0 Dexon and the skin with subcuticular stitch of 4-  0 Monocryl.           ______________________________  Kathreen Cosier, M.D.     BAM/MEDQ  D:  03/12/2006  T:  03/12/2006  Job:  [id number]

## 2010-06-01 NOTE — Letter (Signed)
April 22, 2006    Autumn Valenzuela   RE:  Autumn Valenzuela, Autumn Valenzuela  MRN:  [id number]  /  DOB:  Jul 31, 1960   Dear [name]:   It is my pleasure to have treated you recently as a new patient in my  office.  I appreciate your confidence and the opportunity to participate  in your care.   Since I do have a busy inpatient endoscopy schedule and office schedule,  my office hours vary weekly.  I am, however, available for emergency  calls every day through my office.  If I cannot promptly meet an urgent  office appointment, another one of our gastroenterologists will be able  to assist you.   My well-trained staff are prepared to help you at all times.  For  emergencies after office hours, a physician from our gastroenterology  section is always available through my 24-hour answering service.   While you are under my care, I encourage discussion of your questions  and concerns, and I will be happy to return your calls as soon as I am  available.   Once again, I welcome you as a new patient and I look forward to a happy  and healthy relationship.    Sincerely,      Barbette Hair. Arlyce Dice, MD,FACG  Electronically Signed   RDK/MedQ  DD: 04/22/2006  DT: 04/23/2006  Job #: [id number]

## 2010-06-01 NOTE — Discharge Summary (Signed)
Autumn Valenzuela, Autumn Valenzuela                ACCOUNT NO.:  [id number]   MEDICAL RECORD NO.:  [id number]          PATIENT TYPE:  INP   LOCATION:  9317                          FACILITY:  [facility]   PHYSICIAN:  Kathreen Cosier, M.D.DATE OF BIRTH:  1960/11/15   DATE OF ADMISSION:  03/12/2006  DATE OF DISCHARGE:  03/15/2006                               DISCHARGE SUMMARY   The patient is a 50 year old gravida 3, para 3 with abnormal periods and  on exam was found to have a large prolapsing myoma plus multiple myomas.  She had had 3 previous C-sections in the past.  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  She underwent a supracervical  hysterectomy and vaginal removal of the prolapsing myoma.  Her ovaries  were normal and were not removed.  Postop, she did fine.  Her hemoglobin  was 9.9.   LABORATORY DATA:  Hemoglobin preop 11.4, platelets 378.  PT/PTT normal.  Sodium 138, potassium 3.8, chloride 108, glucose 103.  Urinalysis  negative.  She was discharged home on the third postoperative day  ambulatory, on a regular diet, to see me in 4 weeks.   DISCHARGE DIAGNOSIS:  Status post total abdominal hysterectomy because  of multiple myelomas.           ______________________________  Kathreen Cosier, M.D.     BAM/MEDQ  D:  04/02/2006  T:  04/02/2006  Job:  [id number]

## 2010-06-01 NOTE — Op Note (Signed)
NAMETAWNIA, SCHIRM                ACCOUNT NO.:  [id number]   MEDICAL RECORD NO.:  [id number]          PATIENT TYPE:  AMB   LOCATION:  [facility]                         FACILITY:  [facility]   PHYSICIAN:  Deidre Ala, M.D.    DATE OF BIRTH:  1960/08/13   DATE OF PROCEDURE:  02/25/2006  DATE OF DISCHARGE:                               OPERATIVE REPORT   PREOPERATIVE DIAGNOSES:  1. Degenerative medial meniscus tear left knee.  2. Left knee osteoarthritis.   POSTOPERATIVE DIAGNOSES:  1. Degenerative medial and lateral meniscus tears.  2. Degenerative joint disease, grade 2 and 3 track compartment.  3. Tight lateral retinaculum.  4. Large medial and lateral plicas and parapatellar synovitis.   OPERATION:  1. Left knee operative arthroscopy with partial medial and lateral      meniscectomies.  2. Abrasion ablation chondroplasty tricompartment.  3. Arthroscopic lateral retinacular release.  4. Plica excision track compartment.   SURGEON:  Doristine Section, MD   ASSISTANT:  Phineas Semen, [facility]   ANESTHESIA:  General with LMA.   CULTURES:  None.   DRAINS:  None.   ESTIMATED BLOOD LOSS:  Minimal.   TOURNIQUET TIME:  35 minutes.   PATHOLOGIC FINDINGS AND HISTORY:  Autumn Valenzuela was sent by Dr. Zenaida Deed for  knee pain.  She is post a stroke many years ago affecting her right  side.  She has been working standing up for many years, complains of  pain in her knee with walking.  We saw her January 27, 2006, and gave  her a cortisone injection.  She came back February 05, 2006, never got  better with the shots, went for an MRI scan which showed a degenerative  posterior horn medial meniscus tear with advanced medial compartment  degenerative chondrosis.  There was a blunted free edge of the body of  the lateral meniscus.  She elected to proceed with operative  intervention at surgery.  She had a degenerative medial meniscus tear  posterior horn as described, and was this was saucerized out.  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  The cruciate complex was intact.  The lateral  meniscus was degenerative from front to back with an area of loose  cartilage on the tibial plateau, grade 2-3 that we debrided.  The medial  femoral condyle around the front also had grade 2 changes.  She had a  tight lateral retinaculum with patellar tilt and track, huge medial and  lateral plicas with synovitis tricompartment.  All this was debrided,  lateral retinacular release carried out, and abrasion ablation  chondroplasty completed throughout the knee.   PROCEDURE:  With adequate anesthesia obtained using LMA technique, 1 g  Ancef given IV prophylaxis, the patient was placed in the supine  position.  The left lower extremity was prepped from the malleoli to the  leg holder in the standard fashion.  After standard prepping and  draping, Esmarch exsanguination was used.  The tourniquet was let up to  350 mmHg.  Superolateral inflow portal was  made; the knee was  insufflated with normal saline with the arthroscopic pump.  Medial and  lateral scope portals were then made, and the joint was thoroughly  inspected.  I then shaved out the medial plica back to the sidewall and  lysed the medial band.  I then checked the medial meniscus and probed  it, used basket and shaver to saucerize the posterior medial meniscus  tear and used the ablator on 1 to smooth the medial femoral condyle, the  meniscal inner rim, and the medial tibial plateau.  Portals were  reversed, and I saucerized and shaved the lateral meniscus tear,  smoothed with the ablator on 1, shaved the lateral tibial plateau  cartilage defect, and smoothed with the ablator on 1 as well as the  lateral femoral condyle.  I then shaved back the lateral plica.  I then  smoothed the posterior surface of the patella with the ArthroCare wand.  I then did an arthroscopic lateral retinacular  release using the Bovie  from vastus lateralis to the joint line and cauterized bleeding points.  Improved tilt and track was observed.  The patient then having tolerated  the procedure well, was awakened, taken to recovery room in satisfactory  condition to be discharged per outpatient routine, given Percocet for  pain and told to call the office for recheck tomorrow.  Laboratory data  within normal limits.           ______________________________  V. Charlesetta Shanks, M.D.     VEP/MEDQ  D:  02/25/2006  T:  02/25/2006  Job:  [id number]

## 2010-06-01 NOTE — Consult Note (Signed)
Autumn Valenzuela                ACCOUNT NO.:  [id number]   MEDICAL RECORD NO.:  [id number]          PATIENT TYPE:  INP   LOCATION:  3025                         FACILITY:  [facility]   PHYSICIAN:  Deanna Artis. Hickling, M.D.DATE OF BIRTH:  1960/10/23   DATE OF CONSULTATION:  07/05/2005  DATE OF DISCHARGE:                                   CONSULTATION   CHIEF COMPLAINT:  Unsteady gait, nausea and vomiting, counterclockwise  vertigo.   HISTORY OF THE PRESENT CONDITION:  Autumn Valenzuela is a 50 year old African-  American woman who was admitted to [facility] today after she  awakened at 5 a.m. and felt dizzy.  She tried to get out of bed and noted  that the room was spinning, her vision was blurred.  She finally was able to  get out of bed with assistance of one of her daughters.  She made it to the  bathroom and then was incontinent of stool (she said it was diarrhea) and  had persistent nausea and vomiting.   After this subsided, she contacted her father, who brought her to [facility]  for evaluation.   CT scan of the brain was unremarkable, but MRI scan showed a shower of  emboli involving the superior cerebellar artery, right more so than left,  with lesions that appeared to be about the same age.  The patient was  admitted to evaluate an embolic stroke.   Other problems that were noted is that the patient has had pain in her left  arm and leg for several months.  She works at [employer] and is on her  feet all day.  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  She was noted to have a lump  in her left breast over the past month and has had lumbar pain for years and  also a somewhat tender cutaneous mass in her midthoracic region.   The patient's evaluation showed evidence of microcytic anemia with low iron  levels and spherocytes.  Laboratory studies were otherwise fairly  unremarkable.  I was asked to see the  patient to determine the etiology of  her dysfunction to make recommendations for further workup and treatment.   PAST MEDICAL HISTORY:  Remarkable for longstanding anemia and for  metrorrhagia.   CURRENT MEDICATIONS:  The patient takes none.   DRUG ALLERGIES:  None known.   FAMILY HISTORY:  The patient's mother has diabetes and hypertension.  Father  has hypertension and had a stroke.  Siblings are healthy.  She has 3 healthy  children.   REVIEW OF SYSTEMS:  As reported on the admission history and physical for  medicine and is remarkable only for symptoms noted in the history of the  present illness.  A 12-system review otherwise negative.   SOCIAL HISTORY:  The patient is divorced.  She has 3 children.  She works at  [employer].  She lives with her youngest daughter.  She quit smoking 5  years ago but admits to smoking about a  third of a pack of cigarettes per  day from her 20s up until her late 30s.  She has not used alcohol or  tobacco.  She does use marijuana and, indeed, her drug screen was positive  not only for tetrahydrocannabinol but also for opiates.  I do not know if  she had pain medicine that she did not tell us about.   PHYSICAL EXAMINATION:  GENERAL:  On examination today, this is a morbidly  obese right-handed woman in no acute distress.  VITAL SIGNS:  Temperature 97.6, blood pressure 159/89, resting pulse 70,  respirations 18, oxygen saturation 100%.  EAR, NOSE AND THROAT:  No bruits.  NECK:  Supple neck.  LUNGS:  Clear.  HEART:  No murmurs.  Pulses normal.  BREASTS:  Left breast mass.  BACK:  There is a soft, somewhat tender mass several centimeters across in  midline.  This could be a lipoma, but ordinarily they are not tender.  ABDOMEN:  Soft,  bowel sounds normal.  EXTREMITIES:  Well-formed.  She has pain in her left knee.  I do not see  signs of arthritis.  I do not feel any cords in her legs.  NEUROLOGIC:  Mental status:  The patient was awake and  alert, without  dysphasia or dyspraxia.  Cranial nerves:  Round, reactive pupils.  Visual  fields full.  Extraocular movements full.  Symmetric facial strength.  Midline tongue.  Air conduction greater than bone conduction bilaterally.  No nystagmus in her eyes.  Motor examination:  No drift, normal strength  except her left lower extremity, where she has give-away due to pain.  Sensation:  No hypesthesia or peripheral neuropathy.  Cerebellar:  Finger-to-  nose, rapid repetitive movements, mild dysmetria.  Gait was not tested.  Deep tendon reflexes are absent.  The patient had bilateral flexor plantar  responses.   Hemoglobin A1c is 6.6.  Cholesterol total 185, HDL 46, LDL 126, VLDL 13,  ratio 4.0, triglycerides 67.  The LDL is slightly elevated.  Erythrocyte  sedimentation rate 14.  Hemoglobin 9.7, hematocrit 31.1, MCV 69.9.  Total  iron 12.  Reticulocyte count is normal, which means in this setting is low.   IMPRESSION:  1.  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  Source is unknown.  434.11.  2.  Morbid obesity, 278.01.  3.  Left knee pain.  4.  Midthoracic cutaneous mass.  5.  Microcytic anemia.  6.  Left breast mass.  7.  Left shoulder pain.  8.  Borderline diabetes.   PLAN:  We need to do a routine workup, which would either include an  extracranial MRA this weekend or carotid Doppler on Monday.  A 2-D  echocardiogram has already been completed and is pending.  We need routine  labs for homocysteine.  We need to perform workup for treatable causes of  stroke in the young, including protein S, protein C, both free and  functional, antithrombin III, lupus anticoagulant panel, anticardiolipin  antibody panel, and factor V Leiden mutation and G to A 20210 prothrombin  mutation.  Finally, we need to consider the possibility of a paradoxical embolus through a patent foramen ovale, and a bubble study may  be necessary and if  positive, venous Dopplers.  She obviously needs workup  for her back and breast masses and possible x-rays of her knee and shoulder.   I appreciate the opportunity to participate in her care.  Deanna Artis. Sharene Skeans, M.D.  Electronically Signed     [facility]/MEDQ  D:  07/05/2005  T:  07/06/2005  Job:  [id number]   cc:   C. Ulyess Mort, M.D.  Fax: [phone]

## 2010-06-01 NOTE — Procedures (Signed)
NAMEPANAGIOTA, Autumn Valenzuela                ACCOUNT NO.:  [id number]   MEDICAL RECORD NO.:  [id number]          PATIENT TYPE:   LOCATION:  SLEEP CENTER                   FACILITY:   PHYSICIAN:  Melvyn Novas, M.D.  DATE OF BIRTH:  12/26/1960   DATE OF STUDY:                            NOCTURNAL POLYSOMNOGRAM   Autumn Valenzuela has undergone a polysomnogram after she reported  problems with excessive daytime sleepiness and tiredness.  The patient  also seems to be chronically sleep deprived, giving a history of an  average of 4 to 5-1/2 hours of sleep nocturnally.  The patient states  she has often restless sleep and feels in the morning not restored.   MEDICATIONS:  Dr. Pearlean Brownie listed:  1. Topiramate 100 mg twice a day.  2. Ultram 100 mg q.6h. for headaches.  3. Zanaflex 4 mg to help with sleep as well as muscle spasms.  4. She is also on Norvasc 5 mg twice a day.  5. Fluoxetine 20 mg daily.  6. Metronidazole 500 mg daily.  7. Iron supplement.  8. Asmanex 220 mg two puffs daily.  9. Rhinocort as well as a Proventil inhaler.   The patient endorsed the Epworth sleepiness score at 7 points.  The  patient endorsed the Beck Depression Scale at 16 points.   Review of the sleep study shows a sleep latency of 27.5 minutes which is  borderline.  The patient reached a REM sleep after another REM latency  of 229 minutes which is excessively long.  Review of the sleep  architecture shows that the patient spent 11% of the total sleep time in  stage I, 71% of the total sleep time in stage II, only 7% of sleep time  was seen in delta sleep.  Also noted a slow wave sleep and 11% in REM  sleep.  The low proportion of slow wave sleep and REM sleep constitutes  an abnormality. Sleep efficiency was also reduced at only 75% of  recorded total time.  The patient had multiple sleep state changes per  hour of sleep which correlates with the high arousal index of 22.6.   The patient awoke multiple times  during this night, leaving her with a  very fragmented sleep pattern.  Between 3 a.m. and 4:20 a.m. in the  morning, she was awake for one prolonged period of time.  The longest  time of consolidated sleep was right after the patient fell asleep at  approximately 11 p.m. up to 1 a.m.   Respiratory data show a nadir of 85% and an average oxygenation of 97%.  The patient had no respiratory significant arousals with an  apnea/hypotonia index of only 2.1.  The few respiratory events that  occurred consisted of three obstructive apneas and three hypotonias.  The maximum length of respiratory events did not exceed 30 seconds.   EKG shows an average heart rate of 57 beats per minute.  Highest heart  rate during sleep was 63.  Lowest heart rate was measured while the  patient was awake at 47 beats per minute.   PSG limited EEG showed no abnormality and no evidence of seizure  disorder.   Periodic limb movements were frequently noted.  The total number of  periodic limb movements and sleep were 111; 45 of those led to arousals.  This resulted in a periodic limb movement index of 8 and is normal.   CONCLUSION:  1. This patient does not suffer from apnea, oxygen desaturations or      cardiac irregularity.  2. The patient has periodic limb movement disorder with an arousal      index of eight events per hour of sleep.  The periodic limb      movements disorder should be treated with a dopaminergic agonist.      Nonperiodic limb movements occurred also with some frequency. Those      are called myoclonic limb movements and myoclonic limb movements      might respond to muscle relaxers. However, periodic limb movements      are likelier to respond to dopaminergic agonists.      Melvyn Novas, M.D.  Diplomate, Biomedical engineer of Sleep  Medicine  Electronically Signed     CD/MEDQ  D:  12/31/2005 17:56:21  T:  01/01/2006 09:03:44  Job:  [id number]   cc:   Pramod P. Pearlean Brownie, MD  Fax:  [phone]   Clyda Greener, M.D.

## 2010-06-01 NOTE — Letter (Signed)
April 22, 2006    Clyda Greener, MD  [address]  [place], [place]  [address]   RE:  Autumn Valenzuela, Autumn Valenzuela  MRN:  [id number]  /  DOB:  07-08-1960   Dear Dr. Bruna Potter:   Upon your kind referral, I had the pleasure of evaluating your patient  and I am pleased to offer my findings.  I saw Autumn Valenzuela in the office  today.  Enclosed is a copy of my progress note that details my findings  and recommendations.   Thank you for the opportunity to participate in your patient's care.    Sincerely,      Barbette Hair. Arlyce Dice, MD,FACG  Electronically Signed    RDK/MedQ  DD: 04/22/2006  DT: 04/23/2006  Job #: [id number]

## 2010-06-01 NOTE — Discharge Summary (Signed)
Autumn Valenzuela, Autumn Valenzuela                ACCOUNT NO.:  [id number]   MEDICAL RECORD NO.:  [id number]          PATIENT TYPE:  INP   LOCATION:  3702                         FACILITY:  [facility]   PHYSICIAN:  Dennis Bast, MD        DATE OF BIRTH:  January 29, 1960   DATE OF ADMISSION:  07/05/2005  DATE OF DISCHARGE:  07/12/2005                                 DISCHARGE SUMMARY   DISCHARGE DIAGNOSES:  1.  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  2.  Anaphylactic reaction to CEFAZOLIN.  3.  Morbid obesity.  4.  Glucose intolerant.  5.  Lipoma of back status post excision.  6.  Fibroid uterus.   DISCHARGE MEDICATIONS:  1.  Zocor 40 mg p.o. daily.  2.  Ferrous sulfate 325 mg p.o. t.i.d.  3.  Aspirin 325 mg p.o. daily.  4.  Phenergan 12.5 mg p.o. b.i.d. p.r.n.  5.  Famotidine 20 mg p.o. daily for 7 days.  6.  Benadryl 25 mg p.o. q.8 h. for itching p.r.n.   DISPOSITION:  The patient is to go home with hospital followup at the  outpatient clinic with Dr. Luiz Iron; she will be called, and follow up with  Dr. Corliss Skains in 2-3 weeks.   PROCEDURES:  1.  X-ray of left shoulder on July 05, 2005.  2.  MRI/MRA of the head on July 05, 2005.  3.  X-ray of the knee on July 05, 2005.  4.  X-ray of the pelvis on July 05, 2005.  5.  X-ray of the spine on July 05, 2005.  6.  Chest x-ray July 05, 2005.  7.  A 2-D echo on July 05, 2005.  8.  CT of the head without contrast July 05, 2005.  9.  MRA of the neck on July 06, 2005.  10. TEE July 08, 2005, that showed a ventricular ejection fraction 60% with      no left ventricular regional wall motion abnormalities, mild atheroma of      the ascending aorta.  The left atrial appendage function was normal.      There was no left atrial appendage thrombus identified.  No intracardiac      shunt was detected.  11. Mammogram  and ultrasound of the breast within normal limits on July 10, 2005.  12. Vaginal ultrasound and pelvic ultrasound on July 12, 2005.  13. Chest x-ray on July 10, 2005.  14. Another procedure was excision of lipoma on the back area on July 11, 2005, done by Dr. Corliss Skains.   CONSULTANTS:  1.  Dr. Sharene Skeans from [facility].  2.  Dr. Corliss Skains from Surgery.   HISTORY OF PRESENT ILLNESS:  This 50 year old African-American obese female  presented to the emergency room this morning with nausea, vomiting and  vertigo, blurry vision, episode of scotoma.  The patient stated first she  was in usual  state of health last night, except some blurry vision.  She  woke up at 5 a.m. and was not able to get up secondary to vertigo, scotoma,  then blurry vision, then vomiting.  She never had similar symptoms before.  She has not had a doctor for many years.  Around 1 year, she experienced  significant pain in her lumbar spine.  Radiated to her lower extremities;  difficulty with walking secondary to pain.  Also, 1 month ago she noted a  lump in her breast.  No mammogram in the past.   LABS ON ADMISSION:  Sodium 140, potassium 4.0, chloride 110, bicarb 20, BUN  7, creatinine 0.6, glucose 123.  Hemoglobin 9.7, hematocrit 31.1, white  blood cell count 13.0, platelets 380, ANC 9.9, MCV 69.9.  Urinalysis:  Blood  large, white blood cell count 3-6, red blood cells 11-20.  PT 12.9, INR 1.0,  PTT 22.   HOSPITAL COURSE:  1.  Embolic cerebellar infarctions:  The patient was treated initially with      Phenergan/Zofran for nausea and vomiting, and she was admitted to the      Neuro Floor.  The following days, her vertigo disappeared, and she was      able to walk with no further difficulties.  She remained at the hospital      mainly to work her up for these embolic infarctions, as we were trying      to find a source.  All the workup was negative for a source of      thromboembolism, and finally the  only culprit was a severe iron      deficiency anemia secondary to heavy menses.  The patient was seen by      Neurology and Stroke Team, who said that she should continue only on      aspirin.  At some point, there was a question of Aggrenox, but the      patient has chronic headaches and migraines; the reason why it was      decided not to try that medication.  At the time of her discharge, her      main problem was still some double vision.  Dr. Pearlean Brownie was consulted      about this, and he said that this can take a long time to go away, so      the patient was advised to use 1 patch in one of her eyes, alternated      every 4 hours.  2.  Anaphylactic reaction with CEFAZOLIN.  Prior to the surgery for her      lipoma, CEFAZOLIN was given, and the patient had a severe reaction with      rash and angioedema in her mouth and tongue and pharynx that was treated      with epinephrine.  3.  Morbid obesity:  This is to be followed as an outpatient, as well as      glucose intolerance and fibroid uterus.  She is to see a gynecologist      for that reason.  At the time of her discharge, the biopsy for her      lipoma was pending, and also needs to be followed up as an outpatient.  4.  Finally, also her anemia is to be followed up as an outpatient.   LABS AT DISCHARGE:  White blood cell count 25.8, hemoglobin 9.1, hematocrit  29, platelets 393.  Sodium 139, potassium 4.1, chloride 109, CO2 23, glucose  148, BUN 5, creatinine 0.8, calcium 9.0.   NOTE:  It was thought that her white blood cell count was elevated due to  that she presented this severe reaction to CEFAZOLIN on July 11, 2005, at  night, and she received steroids and epinephrine, so also this needs to be  followed up as an outpatient.      Dennis Bast, MD     YC/MEDQ  D:  07/19/2005  T:  07/19/2005  Job:  [id number]   cc:   Wilmon Arms. Corliss Skains, M.D.  [address] [place] [address] [address]  [place] H. Sharene Skeans, M.D.  Fax:  [phone]

## 2010-10-08 LAB — COMPREHENSIVE METABOLIC PANEL
AST: 13
Albumin: 3.6
Alkaline Phosphatase: 58
BUN: 8
CO2: 18 — ABNORMAL LOW
Chloride: 112
Creatinine, Ser: 0.59
GFR calc non Af Amer: >60
Potassium: 3.3 — ABNORMAL LOW
Total Bilirubin: 0.2 — ABNORMAL LOW

## 2010-10-08 LAB — CBC
HCT: 35.7 — ABNORMAL LOW
MCV: 84.6
Platelets: 251
RBC: 4.22
WBC: 11.7 — ABNORMAL HIGH

## 2010-10-08 LAB — LIPID PANEL
LDL Cholesterol: 45
Triglycerides: 38
VLDL: 8

## 2010-10-08 LAB — TSH: TSH: 0.609

## 2010-10-09 LAB — CBC
HCT: 28.9 — ABNORMAL LOW
HCT: 29.2 — ABNORMAL LOW
HCT: 29.6 — ABNORMAL LOW
HCT: 38.7
Hemoglobin: 10.2 — ABNORMAL LOW
Hemoglobin: 12.6
Hemoglobin: 9.7 — ABNORMAL LOW
MCHC: 32.4
MCHC: 33.3
MCHC: 33.8
MCV: 85.6
MCV: 85.7
MCV: 86.2
MCV: 86.5
Platelets: 193
Platelets: 196
Platelets: 226
RBC: 3.41 — ABNORMAL LOW
RBC: 3.52 — ABNORMAL LOW
RBC: 3.52 — ABNORMAL LOW
RBC: 4.49
RDW: 17 — ABNORMAL HIGH
WBC: 11 — ABNORMAL HIGH
WBC: 14 — ABNORMAL HIGH
WBC: 16.5 — ABNORMAL HIGH

## 2010-10-09 LAB — COMPREHENSIVE METABOLIC PANEL
ALT: 14
Alkaline Phosphatase: 56
BUN: 10
CO2: 23
GFR calc non Af Amer: >60
Glucose, Bld: 107 — ABNORMAL HIGH
Potassium: 4.4
Sodium: 142
Total Bilirubin: 0.1 — ABNORMAL LOW

## 2010-10-09 LAB — BASIC METABOLIC PANEL
BUN: 2 — ABNORMAL LOW
BUN: 2 — ABNORMAL LOW
BUN: 3 — ABNORMAL LOW
BUN: 5 — ABNORMAL LOW
CO2: 20
CO2: 21
Calcium: 8.5
Calcium: 8.7
Chloride: 108
Chloride: 114 — ABNORMAL HIGH
Chloride: 114 — ABNORMAL HIGH
Creatinine, Ser: 0.62
Creatinine, Ser: 0.65
Creatinine, Ser: 0.67
GFR calc Af Amer: >60
GFR calc Af Amer: >60
GFR calc Af Amer: >60
GFR calc non Af Amer: >60
GFR calc non Af Amer: >60
Glucose, Bld: 100 — ABNORMAL HIGH
Glucose, Bld: 111 — ABNORMAL HIGH
Potassium: 3.4 — ABNORMAL LOW
Potassium: 3.4 — ABNORMAL LOW
Potassium: 3.8
Sodium: 140
Sodium: 140

## 2010-10-09 LAB — URINALYSIS, ROUTINE W REFLEX MICROSCOPIC
Bilirubin Urine: NEGATIVE
Glucose, UA: NEGATIVE
Hgb urine dipstick: NEGATIVE
Protein, ur: NEGATIVE

## 2010-10-09 LAB — TYPE AND SCREEN: ABO/RH(D): A NEG

## 2010-10-09 LAB — DIFFERENTIAL
Basophils Absolute: 0
Basophils Relative: 0
Eosinophils Absolute: 0.2
Monocytes Relative: 6
Neutro Abs: 8.1 — ABNORMAL HIGH
Neutrophils Relative %: 66

## 2010-10-09 LAB — URINE CULTURE: Colony Count: 45000

## 2010-10-09 LAB — PROTIME-INR
INR: 1
Prothrombin Time: 13.8

## 2010-10-16 LAB — COMPREHENSIVE METABOLIC PANEL
AST: 16
BUN: 11
CO2: 23
Calcium: 9.4
Chloride: 113 — ABNORMAL HIGH
Creatinine, Ser: 0.62
GFR calc non Af Amer: >60
Glucose, Bld: 113 — ABNORMAL HIGH
Total Bilirubin: 0.4

## 2010-10-16 LAB — URINALYSIS, ROUTINE W REFLEX MICROSCOPIC
Bilirubin Urine: NEGATIVE
Nitrite: NEGATIVE
Specific Gravity, Urine: 1.026
Urobilinogen, UA: 0.2

## 2010-10-16 LAB — DIFFERENTIAL
Basophils Absolute: 0.1
Eosinophils Relative: 2
Lymphocytes Relative: 25
Lymphs Abs: 2.7
Neutro Abs: 7.1
Neutrophils Relative %: 66

## 2010-10-16 LAB — CBC
HCT: 38.5
Hemoglobin: 12.5
MCHC: 32.4
MCV: 87
RBC: 4.43
WBC: 10.7 — ABNORMAL HIGH

## 2010-10-16 LAB — WET PREP, GENITAL

## 2010-10-16 LAB — GC/CHLAMYDIA PROBE AMP, GENITAL: Chlamydia, DNA Probe: NEGATIVE

## 2010-10-16 LAB — LIPASE, BLOOD: Lipase: 41

## 2010-10-31 LAB — CBC
HCT: 38.7
Hemoglobin: 12.3
Hemoglobin: 9.3 — ABNORMAL LOW
MCHC: 33.4
MCHC: 33.5
MCHC: 33.6
MCV: 79.1
MCV: 80.2
MCV: 81.3
Platelets: 216
RBC: 3.51 — ABNORMAL LOW
RBC: 3.62 — ABNORMAL LOW
RBC: 3.75 — ABNORMAL LOW
RDW: 20.1 — ABNORMAL HIGH
RDW: 20.5 — ABNORMAL HIGH
WBC: 12.5 — ABNORMAL HIGH
WBC: 9.1

## 2010-10-31 LAB — COMPREHENSIVE METABOLIC PANEL
Alkaline Phosphatase: 63
BUN: 9
Chloride: 119 — ABNORMAL HIGH
GFR calc non Af Amer: >60
Glucose, Bld: 102 — ABNORMAL HIGH
Potassium: 5.7 — ABNORMAL HIGH
Total Bilirubin: 0.3

## 2010-10-31 LAB — URINALYSIS, ROUTINE W REFLEX MICROSCOPIC
Bilirubin Urine: NEGATIVE
Ketones, ur: NEGATIVE
Nitrite: NEGATIVE
Urobilinogen, UA: 1
pH: 6.5

## 2010-10-31 LAB — APTT: aPTT: 31

## 2010-10-31 LAB — BASIC METABOLIC PANEL
BUN: 3 — ABNORMAL LOW
CO2: 21
CO2: 22
Calcium: 8.8
Chloride: 109
Creatinine, Ser: 0.57
Creatinine, Ser: 0.62
GFR calc Af Amer: >60
GFR calc Af Amer: >60
Glucose, Bld: 116 — ABNORMAL HIGH

## 2010-10-31 LAB — PROTIME-INR
INR: 1
Prothrombin Time: 13.4

## 2010-10-31 LAB — DIFFERENTIAL
Basophils Absolute: 0
Basophils Relative: 0
Neutro Abs: 4.6
Neutrophils Relative %: 51

## 2010-12-29 ENCOUNTER — Emergency Department (HOSPITAL_COMMUNITY)
Admission: EM | Admit: 2010-12-29 | Discharge: 2010-12-29 | Disposition: A | Discharge status: Home / Self Care | Payer: 59 | Attending: Emergency Medicine | Admitting: Emergency Medicine

## 2010-12-29 ENCOUNTER — Emergency Department (HOSPITAL_COMMUNITY): Payer: 59

## 2010-12-29 ENCOUNTER — Other Ambulatory Visit: Payer: Self-pay

## 2010-12-29 DIAGNOSIS — R059 Cough, unspecified: Secondary | ICD-10-CM | POA: Insufficient documentation

## 2010-12-29 DIAGNOSIS — Z9889 Other specified postprocedural states: Secondary | ICD-10-CM | POA: Insufficient documentation

## 2010-12-29 DIAGNOSIS — F329 Major depressive disorder, single episode, unspecified: Secondary | ICD-10-CM | POA: Insufficient documentation

## 2010-12-29 DIAGNOSIS — R0602 Shortness of breath: Secondary | ICD-10-CM | POA: Insufficient documentation

## 2010-12-29 DIAGNOSIS — R4789 Other speech disturbances: Secondary | ICD-10-CM | POA: Insufficient documentation

## 2010-12-29 DIAGNOSIS — F3289 Other specified depressive episodes: Secondary | ICD-10-CM | POA: Insufficient documentation

## 2010-12-29 DIAGNOSIS — I10 Essential (primary) hypertension: Secondary | ICD-10-CM | POA: Insufficient documentation

## 2010-12-29 DIAGNOSIS — R079 Chest pain, unspecified: Secondary | ICD-10-CM | POA: Insufficient documentation

## 2010-12-29 DIAGNOSIS — R220 Localized swelling, mass and lump, head: Secondary | ICD-10-CM | POA: Insufficient documentation

## 2010-12-29 DIAGNOSIS — R221 Localized swelling, mass and lump, neck: Secondary | ICD-10-CM | POA: Insufficient documentation

## 2010-12-29 DIAGNOSIS — R209 Unspecified disturbances of skin sensation: Secondary | ICD-10-CM | POA: Insufficient documentation

## 2010-12-29 DIAGNOSIS — Z79899 Other long term (current) drug therapy: Secondary | ICD-10-CM | POA: Insufficient documentation

## 2010-12-29 DIAGNOSIS — Z8673 Personal history of transient ischemic attack (TIA), and cerebral infarction without residual deficits: Secondary | ICD-10-CM | POA: Insufficient documentation

## 2010-12-29 DIAGNOSIS — R05 Cough: Secondary | ICD-10-CM | POA: Insufficient documentation

## 2010-12-29 DIAGNOSIS — R51 Headache: Secondary | ICD-10-CM | POA: Insufficient documentation

## 2010-12-29 HISTORY — DX: Major depressive disorder, single episode, unspecified: F32.9

## 2010-12-29 HISTORY — DX: Essential (primary) hypertension: I10

## 2010-12-29 HISTORY — DX: Depression, unspecified: F32.A

## 2010-12-29 HISTORY — DX: Cerebral infarction, unspecified: I63.9

## 2010-12-29 LAB — COMPREHENSIVE METABOLIC PANEL
ALT: 8 U/L (ref 0–35)
Alkaline Phosphatase: 63 U/L (ref 39–117)
BUN: 12 mg/dL (ref 6–23)
CO2: 21 mEq/L (ref 19–32)
GFR calc Af Amer: >90 mL/min (ref >90)
GFR calc non Af Amer: >90 mL/min (ref >90)
Glucose, Bld: 115 mg/dL — ABNORMAL HIGH (ref 70–99)
Potassium: 3.7 mEq/L (ref 3.5–5.1)
Sodium: 140 mEq/L (ref 135–145)
Total Bilirubin: 0.2 mg/dL — ABNORMAL LOW (ref 0.3–1.2)
Total Protein: 7.1 g/dL (ref 6.0–8.3)

## 2010-12-29 LAB — CBC
Hemoglobin: 13 g/dL (ref 12.0–15.0)
MCH: 28.2 pg (ref 26.0–34.0)
MCV: 85.2 fL (ref 78.0–100.0)
Platelets: 244 10*3/uL (ref 150–400)
RBC: 4.61 MIL/uL (ref 3.87–5.11)
WBC: 13.3 10*3/uL — ABNORMAL HIGH (ref 4.0–10.5)

## 2010-12-29 LAB — DIFFERENTIAL
Eosinophils Absolute: 0.3 10*3/uL (ref 0.0–0.7)
Eosinophils Relative: 2 % (ref 0–5)
Lymphocytes Relative: 35 % (ref 12–46)
Lymphs Abs: 4.7 10*3/uL — ABNORMAL HIGH (ref 0.7–4.0)
Monocytes Relative: 6 % (ref 3–12)
Neutrophils Relative %: 56 % (ref 43–77)

## 2010-12-29 LAB — POCT I-STAT, CHEM 8
Calcium, Ion: 1.14 mmol/L (ref 1.12–1.32)
Creatinine, Ser: 0.7 mg/dL (ref 0.50–1.10)
Glucose, Bld: 118 mg/dL — ABNORMAL HIGH (ref 70–99)
HCT: 42 % (ref 36.0–46.0)
Hemoglobin: 14.3 g/dL (ref 12.0–15.0)
TCO2: 23 mmol/L (ref 0–100)

## 2010-12-29 LAB — TROPONIN I: Troponin I: <0.3 ng/mL (ref <0.30)

## 2010-12-29 MED ORDER — METOCLOPRAMIDE HCL 5 MG/ML IJ SOLN
10.0000 mg | Freq: Once | INTRAMUSCULAR | Status: AC
  Administered 2010-12-29: 10 mg via INTRAVENOUS
  Filled 2010-12-29: qty 2

## 2010-12-29 MED ORDER — PROMETHAZINE HCL 25 MG PO TABS: ORAL_TABLET | ORAL | Status: DC

## 2010-12-29 MED ORDER — SODIUM CHLORIDE 0.9 % IV BOLUS (SEPSIS): 500.0000 mL | Freq: Once | INTRAVENOUS | Status: DC

## 2010-12-29 MED ORDER — DIPHENHYDRAMINE HCL 50 MG/ML IJ SOLN
25.0000 mg | Freq: Once | INTRAMUSCULAR | Status: AC
  Administered 2010-12-29: 25 mg via INTRAVENOUS
  Filled 2010-12-29: qty 1

## 2010-12-29 MED ORDER — ACETAMINOPHEN-CODEINE #3 300-30 MG PO TABS: 1.0000 | ORAL_TABLET | Freq: Four times a day (QID) | ORAL | Status: AC | PRN

## 2010-12-29 MED ORDER — ACETAMINOPHEN-CODEINE #3 300-30 MG PO TABS: 1.0000 | ORAL_TABLET | Freq: Four times a day (QID) | ORAL | Status: DC | PRN

## 2010-12-29 MED ORDER — SODIUM CHLORIDE 0.9 % IV BOLUS (SEPSIS)
500.0000 mL | Freq: Once | INTRAVENOUS | Status: AC
  Administered 2010-12-29: 500 mL via INTRAVENOUS

## 2010-12-29 MED ORDER — DEXAMETHASONE SODIUM PHOSPHATE 10 MG/ML IJ SOLN
10.0000 mg | Freq: Once | INTRAMUSCULAR | Status: AC
  Administered 2010-12-29: 10 mg via INTRAVENOUS
  Filled 2010-12-29: qty 1

## 2010-12-29 NOTE — ED Notes (Signed)
Pt stating "I have visions when something is going to happened to me, and I had a vision that I had a stroke and was taken out of work" "I called my dr repeatly and he keeps telling me that I need an MRI but I never get an appointment"

## 2010-12-29 NOTE — ED Notes (Signed)
CBG  114 Rn notified [facility]

## 2010-12-29 NOTE — ED Notes (Signed)
Patient transported to MRI 

## 2010-12-29 NOTE — ED Notes (Signed)
portible of xray at bedside

## 2010-12-29 NOTE — ED Notes (Signed)
Pt states that at 2300 last night, she thinks she had a stroke, c/o tingling bilat hands and feet.

## 2010-12-29 NOTE — ED Provider Notes (Signed)
Patient states her headache has decreased by 50% and is lying in bed without any neurofocal findings. Normal speech. Patient states she is feeling improved and feels safe to go home and follow up with PCP and or neurologist for further evaluation of headache.   Jenness Corner, [place] 12/29/10 [phone]

## 2010-12-29 NOTE — ED Provider Notes (Signed)
History     CSN: [id number] Arrival date & time: 12/29/2010 10:26 AM   First MD Initiated Contact with Autumn Valenzuela 12/29/10 1048      Chief Complaint  Autumn Valenzuela presents with  . Cerebrovascular Accident    Pt starting at 11pm last night numbness and tingling in the left side, tingling in bilateral fingers, felt it was a charlie horse, this am, left facial swelling, left arm heavy feeling, severe headache, speech slow and slurred, heavy tongue    (Consider location/radiation/quality/duration/timing/severity/associated sxs/prior treatment) Autumn Valenzuela is a 50 y.o. female presenting with Acute Neurological Problem. The history is provided by the Autumn Valenzuela.  Cerebrovascular Accident This is a new problem. The current episode started yesterday. The problem occurs constantly. Pertinent negatives include no chest pain, no abdominal pain and no shortness of breath.  Pt feels like her arm, jaw and left side are not normal.   Pt has had a stroke before.  This feels similar to that.  Pt feels like her speech is somewhat as well.  She is not sure if her balance is off.  She and her family say they can tell she is coming down with something. Autumn Valenzuela states normally she is able to move her left leg but she is unable to do so at this time. She does have history of prior back surgeries as well as prior strokes.    Past Medical History  Diagnosis Date  . CVA (cerebral infarction)   . Depression   . Hypertension     Past Surgical History  Procedure Date  . Back surgery     History reviewed. No pertinent family history.  History  Substance Use Topics  . Smoking status: Never Smoker   . Smokeless tobacco: Not on file  . Alcohol Use: No    OB History    Grav Para Term Preterm Abortions TAB SAB Ect Mult Living                  Review of Systems  Constitutional: Negative for fever.  Respiratory: Positive for cough. Negative for shortness of breath.   Cardiovascular: Negative for chest pain.    Gastrointestinal: Negative for abdominal pain.  All other systems reviewed and are negative.    Allergies  Review of Autumn Valenzuela's allergies indicates no known allergies.  Home Medications   Current Outpatient Rx  Name Route Sig Dispense Refill  . AMLODIPINE BESYLATE-VALSARTAN 5-320 MG PO TABS Oral Take 1 tablet by mouth every morning.      Marland Kitchen FLUOXETINE HCL 20 MG PO CAPS Oral Take 20 mg by mouth daily.      . TOPIRAMATE 50 MG PO TABS Oral Take 50-100 mg by mouth 2 (two) times daily. 1 in the morning, 2 at bedtime.       BP 147/95  Pulse 86  Temp 98.7 F (37.1 C)  SpO2 97%  Physical Exam  Nursing note and vitals reviewed. Constitutional: She appears well-developed and well-nourished.  HENT:  Head: Normocephalic and atraumatic.  Right Ear: External ear normal.  Left Ear: External ear normal.  Eyes: Conjunctivae and EOM are normal. Right eye exhibits no discharge. Left eye exhibits no discharge. No scleral icterus.  Neck: Neck supple. No tracheal deviation present.  Cardiovascular: Normal rate, regular rhythm and intact distal pulses.   Pulmonary/Chest: Effort normal and breath sounds normal. No stridor. No respiratory distress. She has no wheezes. She has no rales.  Abdominal: Soft. Bowel sounds are normal. She exhibits no distension. There is no tenderness.  There is no rebound and no guarding.  Musculoskeletal: She exhibits no edema.  Neurological: She is alert. She has normal strength. No sensory deficit. Cranial nerve deficit:  no gross defecits noted, no facial droop. She exhibits normal muscle tone. She displays no seizure activity.       Autumn Valenzuela without pronator drift bilateral upper extremities, no visual field cuts noted, Autumn Valenzuela unable to lift her left leg off the bed  Skin: Skin is warm and dry. No rash noted.  Psychiatric: She has a normal mood and affect.    ED Course  Procedures (including critical care time) Date: today   Rate: 60  Rhythm: normal sinus rhythm   QRS Axis: normal  Intervals: normal  ST/T Wave abnormalities: normal  Conduction Disutrbances:none  Narrative Interpretation:   Old EKG Reviewed: none available  Labs Reviewed  CBC - Abnormal; Notable for the following:    WBC 13.3 (*)    RDW 15.7 (*)    All other components within normal limits  DIFFERENTIAL - Abnormal; Notable for the following:    Lymphs Abs 4.7 (*)    All other components within normal limits  GLUCOSE, CAPILLARY - Abnormal; Notable for the following:    Glucose-Capillary 114 (*)    All other components within normal limits  POCT I-STAT, CHEM 8 - Abnormal; Notable for the following:    Glucose, Bld 118 (*)    All other components within normal limits  PROTIME-INR  APTT  I-STAT, CHEM 8  COMPREHENSIVE METABOLIC PANEL  TROPONIN I  POCT CBG MONITORING   Dg Chest Portable 1 View  12/29/2010  *RADIOLOGY REPORT*  Clinical Data: Cough.  Shortness of breath.  Chest pain.  History of hypertension, asthma.  PORTABLE CHEST - 1 VIEW  Comparison: 12/07/2007  Findings: Heart size is accentuated by AP, portable technique.  The lungs are free of focal consolidations and pleural effusions.  No pulmonary edema. Visualized osseous structures have a normal appearance.  IMPRESSION: Negative exam.  Original Report Authenticated By: Patterson Hammersmith, M.D.     1. Headache       MDM  Autumn Valenzuela's symptoms are concerning for a new stroke affecting the left side of her body. She will be moved to CDU to complete her evaluation.  Autumn Valenzuela is pending an MRI this time.  MRI showed no sign of stroke.  Symptoms likely functional in nature although could have been an atypical migraine.  Symptoms improved with treatment.  Reviewing old records showed a previous MRI in the past that was normal as well after a similar presentation.       Celene Kras, MD 12/30/10 [phone]

## 2010-12-29 NOTE — ED Notes (Signed)
Patient transported to MRI and will return to cdu #2 when she is finished.

## 2010-12-29 NOTE — ED Provider Notes (Signed)
2:41 PM Patient is in CDU holding for MR/MRA Brain for stroke-like symptoms that began yesterday.  Patient currently crying, complaining of severe headache and requests medications.  I have discussed her MRI results with her.  Will order migraine medications and recheck.    3:13 PM Discussed patient with Drucie Opitz, PA-C, who assumes care of patient at change of shift.  Plan is for treatment of headache and discharge home.    Rise Patience, [place] 12/29/10 [phone]

## 2010-12-30 NOTE — ED Provider Notes (Signed)
Medical screening examination/treatment/procedure(s) were conducted as a shared visit with non-physician practitioner(s) and myself.  I personally evaluated the patient during the encounter   Celene Kras, MD 12/30/10 1026

## 2011-07-09 ENCOUNTER — Emergency Department (INDEPENDENT_AMBULATORY_CARE_PROVIDER_SITE_OTHER): Payer: 59

## 2011-07-09 ENCOUNTER — Emergency Department (INDEPENDENT_AMBULATORY_CARE_PROVIDER_SITE_OTHER)
Admission: EM | Admit: 2011-07-09 | Discharge: 2011-07-09 | Disposition: A | Discharge status: Home / Self Care | Payer: 59 | Source: Home / Self Care

## 2011-07-09 ENCOUNTER — Encounter (HOSPITAL_COMMUNITY): Payer: Self-pay

## 2011-07-09 DIAGNOSIS — M79609 Pain in unspecified limb: Secondary | ICD-10-CM

## 2011-07-09 DIAGNOSIS — M79644 Pain in right finger(s): Secondary | ICD-10-CM

## 2011-07-09 HISTORY — DX: Bipolar disorder, unspecified: F31.9

## 2011-07-09 NOTE — ED Notes (Signed)
C/o pain in rt thumb for several weeks.  Saw PCP 06/19/11 for same, was told it was arthritis.  States she "jammed " the rt thumb since then and it hurts worse.  States PCP did not xray her finger.

## 2011-07-09 NOTE — ED Provider Notes (Signed)
Medical screening examination/treatment/procedure(s) were performed by non-physician practitioner and as supervising physician I was immediately available for consultation/collaboration.  Raynald Blend, MD 07/09/11 2045

## 2011-07-09 NOTE — ED Provider Notes (Signed)
History     CSN: [id number]  Arrival date & time 07/09/11  1152   None     Chief Complaint  Patient presents with  . Pain    (Consider location/radiation/quality/duration/timing/severity/associated sxs/prior treatment) The history is provided by the patient and a relative.   Autumn Valenzuela is a 51 y.o. female who c/o right hand thumb pain x 2 weeks, reports seen by primary care provider who prescribed medications for arthritis.  States 2 days ago she hit same thumb on bed while getting ready for church.  Pain at worst is 8/10, throbbing in nature, additionally reports popping sensation along with intermittent stiffness. Symptoms have been persistent since that time. Has not taken medication previously prescribed.  Reports no numbness but intermittent tingling noted.  Denies history of previous injury to same.  No related problems musculoskeletal problems.  Past Medical History  Diagnosis Date  . CVA (cerebral infarction)   . Depression   . Hypertension   . Bipolar disorder     Past Surgical History  Procedure Date  . Back surgery     No family history on file.  History  Substance Use Topics  . Smoking status: Never Smoker   . Smokeless tobacco: Not on file  . Alcohol Use: Yes    OB History    Grav Para Term Preterm Abortions TAB SAB Ect Mult Living                  Review of Systems  All other systems reviewed and are negative.    Allergies  Cefazolin  Home Medications   Current Outpatient Rx  Name Route Sig Dispense Refill  . ALPRAZOLAM 0.5 MG PO TABS Oral Take 0.5 mg by mouth 2 (two) times daily.    Marland Kitchen AMLODIPINE BESYLATE-VALSARTAN 5-320 MG PO TABS Oral Take 1 tablet by mouth every morning.      . CYCLOBENZAPRINE HCL 10 MG PO TABS Oral Take 10 mg by mouth 3 (three) times daily as needed.    Marland Kitchen DIVALPROEX SODIUM 250 MG PO TBEC Oral Take 250 mg by mouth 2 (two) times daily.    . DULOXETINE HCL 30 MG PO CPEP Oral Take 30 mg by mouth daily.    . IBUPROFEN  800 MG PO TABS Oral Take 800 mg by mouth every 8 (eight) hours as needed.    . TOPIRAMATE 50 MG PO TABS Oral Take 50-100 mg by mouth 2 (two) times daily. 1 in the morning, 2 at bedtime.     . TRAMADOL HCL 50 MG PO TABS Oral Take 50 mg by mouth every 6 (six) hours as needed.    Marland Kitchen ZOLPIDEM TARTRATE 5 MG PO TABS Oral Take 5 mg by mouth 2 (two) times daily.    Marland Kitchen FLUOXETINE HCL 20 MG PO CAPS Oral Take 20 mg by mouth daily.      Marland Kitchen PROMETHAZINE HCL 25 MG PO TABS  1 tablet by mouth every 6 hours as needed for headache and nausea. 30 tablet 0    BP 98/69  Pulse 88  Temp 98.5 F (36.9 C) (Oral)  Resp 20  SpO2 99%  Physical Exam  Nursing note and vitals reviewed. Constitutional: She is oriented to person, place, and time. Vital signs are normal. She appears well-developed and well-nourished. She is active and cooperative.  HENT:  Head: Normocephalic.  Eyes: Conjunctivae are normal. Pupils are equal, round, and reactive to light. No scleral icterus.  Neck: Trachea normal. Neck supple.  Cardiovascular:  Normal rate, regular rhythm, normal heart sounds and normal pulses.   Pulmonary/Chest: Effort normal and breath sounds normal.  Musculoskeletal:       Right wrist: She exhibits tenderness.       Arms: Neurological: She is alert and oriented to person, place, and time. She has normal strength. No cranial nerve deficit or sensory deficit. GCS eye subscore is 4. GCS verbal subscore is 5. GCS motor subscore is 6.  Skin: Skin is warm, dry and intact.  Psychiatric: She has a normal mood and affect. Her speech is normal and behavior is normal. Judgment and thought content normal. Cognition and memory are normal.    ED Course  Procedures (including critical care time)  Labs Reviewed - No data to display Dg Hand Complete Right  07/09/2011  *RADIOLOGY REPORT*  Clinical Data: Right-sided hand pain, no injury  RIGHT HAND - COMPLETE 3+ VIEW  Comparison: 08/29/2008  Findings: No fracture or dislocation.   Joint spaces are preserved. No definite evidence of chondrocalcinosis.  Regional soft tissues are normal.  IMPRESSION: No explanation for patient's persistent hand pain.  Original Report Authenticated By: Waynard Reeds, M.D.     1. Pain of right thumb       MDM  Xray WNL, begin Ibuprofen and ultram as previously prescribed.  Follow up with orthopedist for further evaluation and treatment if pain remains persistent.  Ace wrap for comfort.   Johnsie Kindred, NP 07/09/11 2033

## 2011-07-12 ENCOUNTER — Encounter (HOSPITAL_COMMUNITY): Payer: Self-pay | Admitting: *Deleted

## 2011-07-12 ENCOUNTER — Emergency Department (HOSPITAL_COMMUNITY)
Admission: EM | Admit: 2011-07-12 | Discharge: 2011-07-12 | Disposition: A | Discharge status: Home / Self Care | Payer: Medicare FFS | Attending: Emergency Medicine | Admitting: Emergency Medicine

## 2011-07-12 DIAGNOSIS — F319 Bipolar disorder, unspecified: Secondary | ICD-10-CM | POA: Insufficient documentation

## 2011-07-12 DIAGNOSIS — M25562 Pain in left knee: Secondary | ICD-10-CM

## 2011-07-12 DIAGNOSIS — M25569 Pain in unspecified knee: Secondary | ICD-10-CM | POA: Insufficient documentation

## 2011-07-12 DIAGNOSIS — Z79899 Other long term (current) drug therapy: Secondary | ICD-10-CM | POA: Insufficient documentation

## 2011-07-12 DIAGNOSIS — I10 Essential (primary) hypertension: Secondary | ICD-10-CM | POA: Insufficient documentation

## 2011-07-12 DIAGNOSIS — Z8673 Personal history of transient ischemic attack (TIA), and cerebral infarction without residual deficits: Secondary | ICD-10-CM | POA: Insufficient documentation

## 2011-07-12 NOTE — ED Provider Notes (Signed)
History   This chart was scribed for No att. providers found by Toya Smothers. The patient was seen in room TR04C/TR04C. Patient's care was started at 1416.  CSN: [id number]  Arrival date & time 07/12/11  1416   First MD Initiated Contact with Patient 07/12/11 1447      Chief Complaint  Patient presents with  . Knee Pain   Patient is a 51 y.o. female presenting with knee pain. The history is provided by the patient. No language interpreter was used.  Knee Pain Pertinent negatives include no chest pain, no abdominal pain, no headaches and no shortness of breath.    Autumn Valenzuela is a 51 y.o. female who presents to the Emergency Department complaining of sudden onset moderate sever constant R knee pain onset yesterday. While gardening pt states that she loss balance and fell sustaining injury to her L knee, denying LOC and head injury. Pt reports prior swelling from 3 weeks ago. 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 Pt was not wearing brace during fall and lists a h/o knee replacement.    Past Medical History  Diagnosis Date  . CVA (cerebral infarction)   . Depression   . Hypertension   . Bipolar disorder     Past Surgical History  Procedure Date  . Back surgery     No family history on file.  History  Substance Use Topics  . Smoking status: Never Smoker   . Smokeless tobacco: Not on file  . Alcohol Use: Yes   Review of Systems  Constitutional: Negative for fever.  HENT: Negative for rhinorrhea.   Eyes: Negative for pain.  Respiratory: Negative for cough and shortness of breath.   Cardiovascular: Negative for chest pain.  Gastrointestinal: Negative for nausea, vomiting, abdominal pain and diarrhea.  Genitourinary: Negative for dysuria.  Musculoskeletal: Positive for arthralgias (L knee pain). Negative for back pain.  Skin: Negative for rash.  Neurological: Positive for weakness.  Negative for headaches.    Allergies  Cefazolin  Home Medications   Current Outpatient Rx  Name Route Sig Dispense Refill  . ALPRAZOLAM 0.5 MG PO TABS Oral Take 0.5 mg by mouth 2 (two) times daily.    Marland Kitchen AMLODIPINE BESYLATE-VALSARTAN 5-320 MG PO TABS Oral Take 1 tablet by mouth every morning.      . CYCLOBENZAPRINE HCL 10 MG PO TABS Oral Take 10 mg by mouth 3 (three) times daily as needed. For pain    . DIVALPROEX SODIUM 250 MG PO TBEC Oral Take 250 mg by mouth 2 (two) times daily.    . DULOXETINE HCL 30 MG PO CPEP Oral Take 30 mg by mouth daily.    Marland Kitchen FLUOXETINE HCL 20 MG PO CAPS Oral Take 20 mg by mouth daily.      . IBUPROFEN 800 MG PO TABS Oral Take 800 mg by mouth every 8 (eight) hours as needed. For pain    . PROMETHAZINE HCL 25 MG PO TABS  1 tablet by mouth every 6 hours as needed for headache and nausea. 30 tablet 0  . TOPIRAMATE 50 MG PO TABS Oral Take 50-100 mg by mouth 2 (two) times daily. 1 in the morning, 2 at bedtime.     . TRAMADOL HCL 50 MG PO TABS Oral Take 50 mg by mouth every 6 (six) hours as needed. For pain    . ZOLPIDEM TARTRATE 5 MG PO  TABS Oral Take 5 mg by mouth 2 (two) times daily.      BP 113/87  Pulse 91  Temp 98.4 F (36.9 C) (Oral)  Resp 18  SpO2 97%  Physical Exam  Nursing note and vitals reviewed. Constitutional:       Awake, alert, nontoxic appearance.  HENT:  Head: Atraumatic.  Eyes: Right eye exhibits no discharge. Left eye exhibits no discharge.  Neck: Neck supple.  Pulmonary/Chest: Effort normal. She exhibits no tenderness.  Abdominal: Soft. There is no tenderness. There is no rebound.  Musculoskeletal:       Diffuse tenderness L knee. Normal ROM L knee. Non-tender thigh, calf ankle, and foot. Intact Dt Dp L knee. Cap refill less than 2 sec. Normal light touch. 5/5 strength. Diffuse tenderness L knee. No edema. No color change. Stable to [place] stress testing. Negative lachman's, negative McMary's. Flexes to 90. Extends Fully  against resistance.   Neurological:       Mental status and motor strength appears baseline for patient and situation.  Skin: No rash noted.  Psychiatric: She has a normal mood and affect.    ED Course  Procedures (including critical care time) DIAGNOSTIC STUDIES: Oxygen Saturation is 97% on room air, normal by my interpretation.    COORDINATION OF CARE: 1453- Evaluated state of Pt's condition. Pt does not want x ray.   Labs Reviewed - No data to display No results found.   1. Left knee pain       MDM  Pt stable in ED with no significant deterioration in condition.Patient / Family / Caregiver informed of clinical course, understand medical decision-making process, and agree with plan.      I personally performed the services described in this documentation, which was scribed in my presence. The recorded information has been reviewed and considered.    Hurman Horn, MD 07/21/11 1321

## 2011-07-12 NOTE — ED Notes (Signed)
Pt. Stated, knee pain rt. I was outside to do some yard work,  My whole body went down, and I told my Dr. I need a xray, so I'v had pain in rt. knee

## 2011-07-12 NOTE — Discharge Instructions (Signed)
Knee Pain The knee is the complex joint between your thigh and your lower leg. It is made up of bones, tendons, ligaments, and cartilage. The bones that make up the knee are:  The femur in the thigh.   The tibia and fibula in the lower leg.   The patella or kneecap riding in the groove on the lower femur.  CAUSES  Knee pain is a common complaint with many causes. A few of these causes are:  Injury, such as:   A ruptured ligament or tendon injury.   Torn cartilage.   Medical conditions, such as:   Gout   Arthritis   Infections   Overuse, over training or overdoing a physical activity.  Knee pain can be minor or severe. Knee pain can accompany debilitating injury. Minor knee problems often respond well to self-care measures or get well on their own. More serious injuries may need medical intervention or even surgery. SYMPTOMS The knee is complex. Symptoms of knee problems can vary widely. Some of the problems are:  Pain with movement and weight bearing.   Swelling and tenderness.   Buckling of the knee.   Inability to straighten or extend your knee.   Your knee locks and you cannot straighten it.   Warmth and redness with pain and fever.   Deformity or dislocation of the kneecap.  DIAGNOSIS  Determining what is wrong may be very straight forward such as when there is an injury. It can also be challenging because of the complexity of the knee. Tests to make a diagnosis may include:  Your caregiver taking a history and doing a physical exam.   Routine X-rays can be used to rule out other problems. X-rays will not reveal a cartilage tear. Some injuries of the knee can be diagnosed by:   Arthroscopy a surgical technique by which a small video camera is inserted through tiny incisions on the sides of the knee. This procedure is used to examine and repair internal knee joint problems. Tiny instruments can be used during arthroscopy to repair the torn knee cartilage  (meniscus).   Arthrography is a radiology technique. A contrast liquid is directly injected into the knee joint. Internal structures of the knee joint then become visible on X-ray film.   An MRI scan is a non x-ray radiology procedure in which magnetic fields and a computer produce two- or three-dimensional images of the inside of the knee. Cartilage tears are often visible using an MRI scanner. MRI scans have largely replaced arthrography in diagnosing cartilage tears of the knee.   Blood work.   Examination of the fluid that helps to lubricate the knee joint (synovial fluid). This is done by taking a sample out using a needle and a syringe.  TREATMENT The treatment of knee problems depends on the cause. Some of these treatments are:  Depending on the injury, proper casting, splinting, surgery or physical therapy care will be needed.   Give yourself adequate recovery time. Do not overuse your joints. If you begin to get sore during workout routines, back off. Slow down or do fewer repetitions.   For repetitive activities such as cycling or running, maintain your strength and nutrition.   Alternate muscle groups. For example if you are a weight lifter, work the upper body on one day and the lower body the next.   Either tight or weak muscles do not give the proper support for your knee. Tight or weak muscles do not absorb the stress placed   on the knee joint. Keep the muscles surrounding the knee strong.   Take care of mechanical problems.   If you have flat feet, orthotics or special shoes may help. See your caregiver if you need help.   Arch supports, sometimes with wedges on the inner or outer aspect of the heel, can help. These can shift pressure away from the side of the knee most bothered by osteoarthritis.   A brace called an "unloader" brace also may be used to help ease the pressure on the most arthritic side of the knee.   If your caregiver has prescribed crutches, braces,  wraps or ice, use as directed. The acronym for this is PRICE. This means protection, rest, ice, compression and elevation.   Nonsteroidal anti-inflammatory drugs (NSAID's), can help relieve pain. But if taken immediately after an injury, they may actually increase swelling. Take NSAID's with food in your stomach. Stop them if you develop stomach problems. Do not take these if you have a history of ulcers, stomach pain or bleeding from the bowel. Do not take without your caregiver's approval if you have problems with fluid retention, heart failure, or kidney problems.   For ongoing knee problems, physical therapy may be helpful.   Glucosamine and chondroitin are over-the-counter dietary supplements. Both may help relieve the pain of osteoarthritis in the knee. These medicines are different from the usual anti-inflammatory drugs. Glucosamine may decrease the rate of cartilage destruction.   Injections of a corticosteroid drug into your knee joint may help reduce the symptoms of an arthritis flare-up. They may provide pain relief that lasts a few months. You may have to wait a few months between injections. The injections do have a small increased risk of infection, water retention and elevated blood sugar levels.   Hyaluronic acid injected into damaged joints may ease pain and provide lubrication. These injections may work by reducing inflammation. A series of shots may give relief for as long as 6 months.   Topical painkillers. Applying certain ointments to your skin may help relieve the pain and stiffness of osteoarthritis. Ask your pharmacist for suggestions. Many over the-counter products are approved for temporary relief of arthritis pain.   In some countries, doctors often prescribe topical NSAID's for relief of chronic conditions such as arthritis and tendinitis. A review of treatment with NSAID creams found that they worked as well as oral medications but without the serious side effects.    PREVENTION  Maintain a healthy weight. Extra pounds put more strain on your joints.   Get strong, stay limber. Weak muscles are a common cause of knee injuries. Stretching is important. Include flexibility exercises in your workouts.   Be smart about exercise. If you have osteoarthritis, chronic knee pain or recurring injuries, you may need to change the way you exercise. This does not mean you have to stop being active. If your knees ache after jogging or playing basketball, consider switching to swimming, water aerobics or other low-impact activities, at least for a few days a week. Sometimes limiting high-impact activities will provide relief.   Make sure your shoes fit well. Choose footwear that is right for your sport.   Protect your knees. Use the proper gear for knee-sensitive activities. Use kneepads when playing volleyball or laying carpet. Buckle your seat belt every time you drive. Most shattered kneecaps occur in car accidents.   Rest when you are tired.  SEEK MEDICAL CARE IF:  You have knee pain that is continual and does not   seem to be getting better.  SEEK IMMEDIATE MEDICAL CARE IF:  Your knee joint feels hot to the touch and you have a high fever. MAKE SURE YOU:   Understand these instructions.   Will watch your condition.   Will get help right away if you are not doing well or get worse.  Document Released: 10/28/2006 Document Revised: 12/20/2010 Document Reviewed: 10/28/2006 [facility] Patient Information 2012 [place], [place].  Wear your leg brace to stabilize your knee.  Return sooner if you develop new weakness or numbness to the leg, color change to the leg, uncontrolled pain, or other concerns.

## 2011-07-12 NOTE — ED Notes (Signed)
Pt presents with onset of low back pain after falling in her yard today.  Pt reports landing on her buttocks, in the grass, denies hitting head, -LOC.  Pt also reports L knee pain x 3 weeks after slipping in her kitchen.  No swelling noted, pt is able to bear weight.

## 2011-10-27 ENCOUNTER — Other Ambulatory Visit: Payer: Self-pay

## 2011-10-27 ENCOUNTER — Emergency Department (HOSPITAL_COMMUNITY)
Admission: EM | Admit: 2011-10-27 | Discharge: 2011-10-27 | Disposition: A | Discharge status: Home / Self Care | Payer: Medicare FFS | Attending: Emergency Medicine | Admitting: Emergency Medicine

## 2011-10-27 ENCOUNTER — Encounter (HOSPITAL_COMMUNITY): Payer: Self-pay | Admitting: *Deleted

## 2011-10-27 DIAGNOSIS — Z79899 Other long term (current) drug therapy: Secondary | ICD-10-CM | POA: Insufficient documentation

## 2011-10-27 DIAGNOSIS — I10 Essential (primary) hypertension: Secondary | ICD-10-CM | POA: Insufficient documentation

## 2011-10-27 DIAGNOSIS — K5289 Other specified noninfective gastroenteritis and colitis: Secondary | ICD-10-CM | POA: Insufficient documentation

## 2011-10-27 DIAGNOSIS — F319 Bipolar disorder, unspecified: Secondary | ICD-10-CM | POA: Insufficient documentation

## 2011-10-27 DIAGNOSIS — K529 Noninfective gastroenteritis and colitis, unspecified: Secondary | ICD-10-CM

## 2011-10-27 DIAGNOSIS — Z8673 Personal history of transient ischemic attack (TIA), and cerebral infarction without residual deficits: Secondary | ICD-10-CM | POA: Insufficient documentation

## 2011-10-27 LAB — URINALYSIS, ROUTINE W REFLEX MICROSCOPIC
Hgb urine dipstick: NEGATIVE
Nitrite: NEGATIVE
Protein, ur: NEGATIVE mg/dL
Urobilinogen, UA: 1 mg/dL (ref 0.0–1.0)

## 2011-10-27 LAB — CBC WITH DIFFERENTIAL/PLATELET
Basophils Relative: 0 % (ref 0–1)
Eosinophils Absolute: 0.4 10*3/uL (ref 0.0–0.7)
MCH: 29 pg (ref 26.0–34.0)
MCHC: 32.7 g/dL (ref 30.0–36.0)
Neutrophils Relative %: 52 % (ref 43–77)
Platelets: 231 10*3/uL (ref 150–400)
RBC: 4.41 MIL/uL (ref 3.87–5.11)

## 2011-10-27 LAB — BASIC METABOLIC PANEL
Calcium: 9.5 mg/dL (ref 8.4–10.5)
GFR calc non Af Amer: >90 mL/min (ref >90)
Glucose, Bld: 104 mg/dL — ABNORMAL HIGH (ref 70–99)
Sodium: 142 mEq/L (ref 135–145)

## 2011-10-27 LAB — GLUCOSE, CAPILLARY: Glucose-Capillary: 129 mg/dL — ABNORMAL HIGH (ref 70–99)

## 2011-10-27 LAB — HEPATIC FUNCTION PANEL
ALT: 15 U/L (ref 0–35)
Alkaline Phosphatase: 60 U/L (ref 39–117)
Bilirubin, Direct: <0.1 mg/dL (ref 0.0–0.3)

## 2011-10-27 LAB — URINE MICROSCOPIC-ADD ON

## 2011-10-27 MED ORDER — HYDROMORPHONE HCL PF 1 MG/ML IJ SOLN
1.0000 mg | Freq: Once | INTRAMUSCULAR | Status: AC
  Administered 2011-10-27: 1 mg via INTRAVENOUS
  Filled 2011-10-27: qty 1

## 2011-10-27 MED ORDER — DIPHENOXYLATE-ATROPINE 2.5-0.025 MG PO TABS: 1.0000 | ORAL_TABLET | Freq: Four times a day (QID) | ORAL | Status: DC | PRN

## 2011-10-27 MED ORDER — FAMOTIDINE IN NACL 20-0.9 MG/50ML-% IV SOLN
20.0000 mg | Freq: Once | INTRAVENOUS | Status: AC
  Administered 2011-10-27: 20 mg via INTRAVENOUS
  Filled 2011-10-27: qty 50

## 2011-10-27 MED ORDER — ONDANSETRON HCL 4 MG/2ML IJ SOLN
4.0000 mg | Freq: Once | INTRAMUSCULAR | Status: AC
  Administered 2011-10-27: 4 mg via INTRAVENOUS
  Filled 2011-10-27: qty 2

## 2011-10-27 MED ORDER — TOPIRAMATE 25 MG PO TABS
50.0000 mg | ORAL_TABLET | Freq: Once | ORAL | Status: AC
  Administered 2011-10-27: 50 mg via ORAL
  Filled 2011-10-27: qty 2

## 2011-10-27 MED ORDER — ONDANSETRON HCL 4 MG PO TABS: 4.0000 mg | ORAL_TABLET | Freq: Four times a day (QID) | ORAL | Status: DC

## 2011-10-27 MED ORDER — SODIUM CHLORIDE 0.9 % IV BOLUS (SEPSIS)
1000.0000 mL | Freq: Once | INTRAVENOUS | Status: AC
  Administered 2011-10-27: 1000 mL via INTRAVENOUS

## 2011-10-27 MED ORDER — METOCLOPRAMIDE HCL 5 MG/ML IJ SOLN
10.0000 mg | Freq: Once | INTRAMUSCULAR | Status: AC
  Administered 2011-10-27: 10 mg via INTRAVENOUS
  Filled 2011-10-27: qty 2

## 2011-10-27 NOTE — ED Notes (Signed)
Pt requesting migraine medication topamax.

## 2011-10-27 NOTE — ED Provider Notes (Signed)
1500  Report received from [place] PA for this 51yo female with c/o n/v diarrhea pta.  Having diffuse  abdominal pain and cramping nausea.   No further diarrhea or vomiting in the ER.  States that she feels a headache coming on as well and would like to have one of her topamax pills for that.  I will also give her a dose of reglan and pepcid IV and reevaluate later.    1800 Gastroenteritis.  Patient feels better and is ready for discharge.  Rx for zofran and lomotil.  Follow up with pcp on Monday.  Return if worse.     Labs Reviewed  URINALYSIS, ROUTINE W REFLEX MICROSCOPIC - Abnormal; Notable for the following:    APPearance CLOUDY (*)     Leukocytes, UA TRACE (*)     All other components within normal limits  BASIC METABOLIC PANEL - Abnormal; Notable for the following:    Glucose, Bld 104 (*)     All other components within normal limits  CBC WITH DIFFERENTIAL - Abnormal; Notable for the following:    WBC 11.4 (*)     RDW 15.9 (*)     Lymphs Abs 4.5 (*)     All other components within normal limits  GLUCOSE, CAPILLARY - Abnormal; Notable for the following:    Glucose-Capillary 129 (*)     All other components within normal limits  URINE MICROSCOPIC-ADD ON - Abnormal; Notable for the following:    Squamous Epithelial / LPF MANY (*)     Crystals TRIPLE PHOSPHATE CRYSTALS (*)     All other components within normal limits  HEPATIC FUNCTION PANEL - Abnormal; Notable for the following:    Total Bilirubin 0.1 (*)     All other components within normal limits  LIPASE, BLOOD - Abnormal; Notable for the following:    Lipase 61 (*)     All other components within normal limits  LAB REPORT - SCANNED    Remi Haggard, NP 10/29/11 1603

## 2011-10-27 NOTE — ED Provider Notes (Signed)
History     CSN: [id number]  Arrival date & time 10/27/11  1258   First MD Initiated Contact with Patient 10/27/11 1406      Chief Complaint  Patient presents with  . Emesis    (Consider location/radiation/quality/duration/timing/severity/associated sxs/prior treatment) HPI Pt reports she at some liver pudding this morning after church. A short while later she began to have moderate diffuse cramping abdominal pain, associated with multiple episodes of non bloody non bilious emesis and one episode of watery diarrhea. She states she was diaphoretic prior to vomiting. She is concerned she may have had a stroke, although she denies any facial droop, slurred speech or extremity weakness.  Past Medical History  Diagnosis Date  . CVA (cerebral infarction)   . Depression   . Hypertension   . Bipolar disorder     Past Surgical History  Procedure Date  . Back surgery     No family history on file.  History  Substance Use Topics  . Smoking status: Never Smoker   . Smokeless tobacco: Not on file  . Alcohol Use: Yes    OB History    Grav Para Term Preterm Abortions TAB SAB Ect Mult Living                  Review of Systems All other systems reviewed and are negative except as noted in HPI.   Allergies  Cefazolin  Home Medications   Current Outpatient Rx  Name Route Sig Dispense Refill  . ALPRAZOLAM 0.5 MG PO TABS Oral Take 0.5 mg by mouth 2 (two) times daily.    Marland Kitchen AMLODIPINE BESYLATE-VALSARTAN 5-320 MG PO TABS Oral Take 1 tablet by mouth every morning.      Marland Kitchen DIVALPROEX SODIUM 250 MG PO TBEC Oral Take 250 mg by mouth 2 (two) times daily.    . DULOXETINE HCL 60 MG PO CPEP Oral Take 60 mg by mouth daily.    . MELOXICAM 15 MG PO TABS Oral Take 15 mg by mouth daily.    . TOPIRAMATE 50 MG PO TABS Oral Take 50-100 mg by mouth 2 (two) times daily. 1 in the morning, 2 at bedtime.    . TRAMADOL HCL 50 MG PO TABS Oral Take 50 mg by mouth 4 (four) times daily as needed. For pain       BP 124/82  Pulse 76  Temp 97.8 F (36.6 C) (Oral)  Resp 16  SpO2 100%  Physical Exam  Nursing note and vitals reviewed. Constitutional: She is oriented to person, place, and time. She appears well-developed and well-nourished.  HENT:  Head: Normocephalic and atraumatic.  Eyes: EOM are normal. Pupils are equal, round, and reactive to light.  Neck: Normal range of motion. Neck supple.  Cardiovascular: Normal rate, normal heart sounds and intact distal pulses.   Pulmonary/Chest: Effort normal and breath sounds normal.  Abdominal: Bowel sounds are normal. She exhibits no distension. There is tenderness (diffuse tenderness). There is no rebound and no guarding.  Musculoskeletal: Normal range of motion. She exhibits no edema and no tenderness.  Neurological: She is alert and oriented to person, place, and time. She has normal strength. No cranial nerve deficit or sensory deficit.  Skin: Skin is warm and dry. No rash noted.  Psychiatric: She has a normal mood and affect.    ED Course  Procedures (including critical care time)  Labs Reviewed  URINALYSIS, ROUTINE W REFLEX MICROSCOPIC - Abnormal; Notable for the following:    APPearance CLOUDY (*)  Leukocytes, UA TRACE (*)     All other components within normal limits  CBC WITH DIFFERENTIAL - Abnormal; Notable for the following:    WBC 11.4 (*)     RDW 15.9 (*)     Lymphs Abs 4.5 (*)     All other components within normal limits  GLUCOSE, CAPILLARY - Abnormal; Notable for the following:    Glucose-Capillary 129 (*)     All other components within normal limits  URINE MICROSCOPIC-ADD ON - Abnormal; Notable for the following:    Squamous Epithelial / LPF MANY (*)     Crystals TRIPLE PHOSPHATE CRYSTALS (*)     All other components within normal limits  BASIC METABOLIC PANEL  HEPATIC FUNCTION PANEL  LIPASE, BLOOD   No results found.   No diagnosis found.    MDM  No concern for stroke, EKG ordered in triage.  Awaiting labs, will also give IVF, pain and nausea meds, move to CDU for further eval. Discussed with VanWingen, PA.    Date: 10/27/2011  Rate: 70  Rhythm: normal sinus rhythm  QRS Axis: normal  Intervals: normal  ST/T Wave abnormalities: normal  Conduction Disutrbances: none  Narrative Interpretation: unremarkable            Dory Verdun B. Bernette Mayers, MD 10/27/11 1440

## 2011-10-27 NOTE — ED Notes (Signed)
PT states all the sudden started having this feeling wear she could not breath and she was laying down and was about to dose off.  Pt started coughing and abdominal aching and thought diarrhea and then patient dropped to her knees and started vomiting.  Pt states same symptoms she had with her last stroke.  Pt has no stroke deficits currently.  Pt states right now nervous and shook up.

## 2012-03-07 ENCOUNTER — Encounter (HOSPITAL_COMMUNITY): Payer: Self-pay | Admitting: *Deleted

## 2012-03-07 ENCOUNTER — Emergency Department (HOSPITAL_COMMUNITY)
Admission: EM | Admit: 2012-03-07 | Discharge: 2012-03-08 | Disposition: A | Discharge status: Home / Self Care | Payer: Medicare PPO | Attending: Emergency Medicine | Admitting: Emergency Medicine

## 2012-03-07 ENCOUNTER — Other Ambulatory Visit: Payer: Self-pay

## 2012-03-07 ENCOUNTER — Emergency Department (HOSPITAL_COMMUNITY): Payer: Medicare PPO

## 2012-03-07 DIAGNOSIS — R0789 Other chest pain: Secondary | ICD-10-CM

## 2012-03-07 DIAGNOSIS — R197 Diarrhea, unspecified: Secondary | ICD-10-CM | POA: Insufficient documentation

## 2012-03-07 DIAGNOSIS — F319 Bipolar disorder, unspecified: Secondary | ICD-10-CM | POA: Insufficient documentation

## 2012-03-07 DIAGNOSIS — R42 Dizziness and giddiness: Secondary | ICD-10-CM | POA: Insufficient documentation

## 2012-03-07 DIAGNOSIS — R1013 Epigastric pain: Secondary | ICD-10-CM | POA: Insufficient documentation

## 2012-03-07 DIAGNOSIS — R630 Anorexia: Secondary | ICD-10-CM | POA: Insufficient documentation

## 2012-03-07 DIAGNOSIS — R109 Unspecified abdominal pain: Secondary | ICD-10-CM

## 2012-03-07 DIAGNOSIS — I10 Essential (primary) hypertension: Secondary | ICD-10-CM | POA: Insufficient documentation

## 2012-03-07 DIAGNOSIS — Z791 Long term (current) use of non-steroidal anti-inflammatories (NSAID): Secondary | ICD-10-CM | POA: Insufficient documentation

## 2012-03-07 DIAGNOSIS — Z8673 Personal history of transient ischemic attack (TIA), and cerebral infarction without residual deficits: Secondary | ICD-10-CM | POA: Insufficient documentation

## 2012-03-07 DIAGNOSIS — Z79899 Other long term (current) drug therapy: Secondary | ICD-10-CM | POA: Insufficient documentation

## 2012-03-07 DIAGNOSIS — N390 Urinary tract infection, site not specified: Secondary | ICD-10-CM

## 2012-03-07 DIAGNOSIS — R112 Nausea with vomiting, unspecified: Secondary | ICD-10-CM

## 2012-03-07 DIAGNOSIS — R5381 Other malaise: Secondary | ICD-10-CM | POA: Insufficient documentation

## 2012-03-07 DIAGNOSIS — R6883 Chills (without fever): Secondary | ICD-10-CM | POA: Insufficient documentation

## 2012-03-07 DIAGNOSIS — R51 Headache: Secondary | ICD-10-CM | POA: Insufficient documentation

## 2012-03-07 LAB — SAMPLE TO BLOOD BANK

## 2012-03-07 LAB — URINE MICROSCOPIC-ADD ON

## 2012-03-07 LAB — URINALYSIS, ROUTINE W REFLEX MICROSCOPIC
Bilirubin Urine: NEGATIVE
Nitrite: NEGATIVE
Specific Gravity, Urine: 1.026 (ref 1.005–1.030)
pH: 8 (ref 5.0–8.0)

## 2012-03-07 LAB — CBC WITH DIFFERENTIAL/PLATELET
Basophils Absolute: 0 10*3/uL (ref 0.0–0.1)
HCT: 36.2 % (ref 36.0–46.0)
Hemoglobin: 12 g/dL (ref 12.0–15.0)
Lymphocytes Relative: 39 % (ref 12–46)
Monocytes Absolute: 0.6 10*3/uL (ref 0.1–1.0)
Neutro Abs: 6.1 10*3/uL (ref 1.7–7.7)
RDW: 16 % — ABNORMAL HIGH (ref 11.5–15.5)
WBC: 11.6 10*3/uL — ABNORMAL HIGH (ref 4.0–10.5)

## 2012-03-07 LAB — COMPREHENSIVE METABOLIC PANEL
Alkaline Phosphatase: 58 U/L (ref 39–117)
BUN: 18 mg/dL (ref 6–23)
Calcium: 9 mg/dL (ref 8.4–10.5)
Creatinine, Ser: 0.66 mg/dL (ref 0.50–1.10)
GFR calc Af Amer: >90 mL/min (ref >90)
Glucose, Bld: 107 mg/dL — ABNORMAL HIGH (ref 70–99)
Total Protein: 7.4 g/dL (ref 6.0–8.3)

## 2012-03-07 LAB — POCT I-STAT TROPONIN I: Troponin i, poc: 0 ng/mL (ref 0.00–0.08)

## 2012-03-07 MED ORDER — SODIUM CHLORIDE 0.9 % IV BOLUS (SEPSIS)
1000.0000 mL | Freq: Once | INTRAVENOUS | Status: AC
  Administered 2012-03-07: 1000 mL via INTRAVENOUS

## 2012-03-07 MED ORDER — ONDANSETRON HCL 4 MG/2ML IJ SOLN
4.0000 mg | Freq: Once | INTRAMUSCULAR | Status: AC
  Administered 2012-03-07: 4 mg via INTRAVENOUS
  Filled 2012-03-07: qty 2

## 2012-03-07 MED ORDER — MORPHINE SULFATE 4 MG/ML IJ SOLN
4.0000 mg | Freq: Once | INTRAMUSCULAR | Status: AC
  Administered 2012-03-07: 4 mg via INTRAVENOUS
  Filled 2012-03-07: qty 1

## 2012-03-07 NOTE — ED Notes (Signed)
Pt ambulatory to br without difficulty

## 2012-03-07 NOTE — ED Notes (Signed)
Now mentions CP. Sx present as vague and scattered with a growing list of complaints.

## 2012-03-07 NOTE — ED Notes (Addendum)
C/o diarrhea onset Tuesday, abd pain onset Wednesday, also dizziness, nausea and some blood in stool. Also mentions general weakness, HA, sob and body tightness. 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 No known sick contacts. No recent abx.

## 2012-03-07 NOTE — ED Notes (Signed)
Pt to xray via stretcher

## 2012-03-08 MED ORDER — CIPROFLOXACIN HCL 500 MG PO TABS: 500.0000 mg | ORAL_TABLET | Freq: Two times a day (BID) | ORAL | Status: DC

## 2012-03-08 MED ORDER — HYDROCODONE-ACETAMINOPHEN 5-325 MG PO TABS: 2.0000 | ORAL_TABLET | ORAL | Status: DC | PRN

## 2012-03-08 MED ORDER — ONDANSETRON HCL 4 MG PO TABS: 4.0000 mg | ORAL_TABLET | Freq: Four times a day (QID) | ORAL | Status: DC

## 2012-03-08 NOTE — ED Provider Notes (Signed)
History     CSN: [id number]  Arrival date & time 03/07/12  Barry Brunner   First MD Initiated Contact with Patient 03/07/12 2132      Chief Complaint  Patient presents with  . Diarrhea  . Abdominal Pain  . Nausea  . Rectal Bleeding  . Dizziness    (Consider location/radiation/quality/duration/timing/severity/associated sxs/prior treatment) Patient is a 52 y.o. female presenting with GI illness and chest pain. The history is provided by the patient. No language interpreter was used.  GI Problem This is a new problem. The current episode started in the past 7 days. The problem occurs daily. The problem has been unchanged (diarrhea gradually improved, ab pain unchanged, chest pain today). Associated symptoms include abdominal pain, anorexia, chest pain, chills, headaches (chronic), nausea, urinary symptoms and weakness (generalized). Pertinent negatives include no arthralgias, congestion, coughing, diaphoresis, fatigue, fever, neck pain, numbness, sore throat or vomiting. Nothing aggravates the symptoms. She has tried nothing for the symptoms. The treatment provided no relief.  Chest Pain Chest pain location: across chest. Pain radiates to:  Does not radiate Pain radiates to the back: no   Pain severity:  Moderate Onset quality:  Sudden Timing:  Intermittent Progression:  Waxing and waning Chronicity:  New Context: at rest   Context: not breathing and not eating   Relieved by:  Nothing Worsened by:  Nothing tried Ineffective treatments:  None tried Associated symptoms: abdominal pain, anorexia, headache (chronic), nausea and weakness (generalized)   Associated symptoms: no back pain, no cough, no diaphoresis, no fatigue, no fever, no numbness, no palpitations, no shortness of breath and not vomiting   Abdominal pain:    Location:  Epigastric   Quality:  Cramping   Severity:  Moderate   Onset quality:  Gradual   Duration:  2 days   Timing:  Intermittent   Progression:  Waxing and  waning   Chronicity:  New Weakness:    Severity:  Mild   Chronicity:  New   Timing:  Intermittent   Progression:  Waxing and waning Risk factors: hypertension and obesity     Past Medical History  Diagnosis Date  . CVA (cerebral infarction)   . Depression   . Hypertension   . Bipolar disorder     Past Surgical History  Procedure Laterality Date  . Back surgery      No family history on file.  History  Substance Use Topics  . Smoking status: Never Smoker   . Smokeless tobacco: Not on file  . Alcohol Use: Yes    OB History   Grav Para Term Preterm Abortions TAB SAB Ect Mult Living                  Review of Systems  Constitutional: Positive for chills. Negative for fever, diaphoresis, activity change, appetite change and fatigue.  HENT: Negative for congestion, sore throat, facial swelling, rhinorrhea, neck pain and neck stiffness.   Eyes: Negative for photophobia and discharge.  Respiratory: Negative for cough, chest tightness and shortness of breath.   Cardiovascular: Positive for chest pain. Negative for palpitations and leg swelling.  Gastrointestinal: Positive for nausea, abdominal pain and anorexia. Negative for vomiting and diarrhea.  Endocrine: Negative for polydipsia and polyuria.  Genitourinary: Negative for dysuria, frequency, difficulty urinating and pelvic pain.  Musculoskeletal: Negative for back pain and arthralgias.  Skin: Negative for color change and wound.  Allergic/Immunologic: Negative for immunocompromised state.  Neurological: Positive for weakness (generalized) and headaches (chronic). Negative for facial asymmetry and  numbness.  Hematological: Does not bruise/bleed easily.  Psychiatric/Behavioral: Negative for confusion and agitation.    Allergies  Cefazolin  Home Medications   Current Outpatient Rx  Name  Route  Sig  Dispense  Refill  . albuterol (PROVENTIL HFA;VENTOLIN HFA) 108 (90 BASE) MCG/ACT inhaler   Inhalation   Inhale 2  puffs into the lungs every 6 (six) hours as needed for wheezing.         Marland Kitchen ALPRAZolam (XANAX) 0.5 MG tablet   Oral   Take 0.5 mg by mouth 2 (two) times daily as needed for anxiety.          Marland Kitchen amLODipine-valsartan (EXFORGE) 5-320 MG per tablet   Oral   Take 1 tablet by mouth every morning.           . Calcium-Vitamin D-Iron (RA CALCIUM/IRON/VITAMIN D PO)   Oral   Take 1 tablet by mouth daily.         . divalproex (DEPAKOTE) 250 MG DR tablet   Oral   Take 750 mg by mouth at bedtime.          . DULoxetine (CYMBALTA) 60 MG capsule   Oral   Take 60 mg by mouth daily.         . meloxicam (MOBIC) 15 MG tablet   Oral   Take 15 mg by mouth daily.         Marland Kitchen topiramate (TOPAMAX) 50 MG tablet   Oral   Take 50-100 mg by mouth 2 (two) times daily. 1 in the morning, 2 at bedtime.         . traMADol (ULTRAM) 50 MG tablet   Oral   Take 50 mg by mouth 4 (four) times daily as needed for pain. For pain         . zolpidem (AMBIEN) 10 MG tablet   Oral   Take 10 mg by mouth at bedtime as needed for sleep.         . ciprofloxacin (CIPRO) 500 MG tablet   Oral   Take 1 tablet (500 mg total) by mouth 2 (two) times daily.   6 tablet   0   . HYDROcodone-acetaminophen (NORCO) 5-325 MG per tablet   Oral   Take 2 tablets by mouth every 4 (four) hours as needed for pain.   6 tablet   0   . ondansetron (ZOFRAN) 4 MG tablet   Oral   Take 1 tablet (4 mg total) by mouth every 6 (six) hours.   12 tablet   0     BP 99/56  Pulse 76  Temp(Src) 97.4 F (36.3 C) (Oral)  Resp 20  SpO2 99%  Physical Exam  Constitutional: She is oriented to person, place, and time. She appears well-developed and well-nourished. No distress.  HENT:  Head: Normocephalic and atraumatic.  Mouth/Throat: No oropharyngeal exudate.  Eyes: Pupils are equal, round, and reactive to light.  Neck: Normal range of motion. Neck supple.  Cardiovascular: Normal rate, regular rhythm and normal heart  sounds.  Exam reveals no gallop and no friction rub.   No murmur heard. Pulmonary/Chest: Effort normal and breath sounds normal. No respiratory distress. She has no wheezes. She has no rales.  Abdominal: Soft. Bowel sounds are normal. She exhibits no distension and no mass. There is no tenderness. There is no rebound and no guarding.  Musculoskeletal: Normal range of motion. She exhibits no edema and no tenderness.  Neurological: She is alert and oriented to person,  place, and time.  Skin: Skin is warm and dry.  Psychiatric: She has a normal mood and affect.    ED Course  Procedures (including critical care time)  Labs Reviewed  COMPREHENSIVE METABOLIC PANEL - Abnormal; Notable for the following:    Glucose, Bld 107 (*)    Albumin 3.4 (*)    Total Bilirubin 0.2 (*)    All other components within normal limits  CBC WITH DIFFERENTIAL - Abnormal; Notable for the following:    WBC 11.6 (*)    RDW 16.0 (*)    Lymphs Abs 4.5 (*)    All other components within normal limits  URINALYSIS, ROUTINE W REFLEX MICROSCOPIC - Abnormal; Notable for the following:    APPearance TURBID (*)    Leukocytes, UA TRACE (*)    All other components within normal limits  URINE MICROSCOPIC-ADD ON - Abnormal; Notable for the following:    Crystals TRIPLE PHOSPHATE CRYSTALS (*)    All other components within normal limits  POCT I-STAT TROPONIN I  SAMPLE TO BLOOD BANK   Dg Chest 2 View  03/07/2012  *RADIOLOGY REPORT*  Clinical Data: Abdominal pain.  Diarrhea.  Rectal bleeding.  CHEST - 2 VIEW  Comparison: Chest x-ray 12/29/2010.  Findings: Lung volumes are normal.  No consolidative airspace disease.  No pleural effusions.  No pneumothorax.  No pulmonary nodule or mass noted.  Pulmonary vasculature and the cardiomediastinal silhouette are within normal limits.  IMPRESSION: 1. No radiographic evidence of acute cardiopulmonary disease.   Original Report Authenticated By: Trudie Reed, M.D.      1. Abdominal  pain   2. Nausea vomiting and diarrhea   3. Chest tightness   4. UTI (lower urinary tract infection)       MDM  Pt is a 52 y.o. female with pertinent PMHX of CVA, depression, HTN, BPD who presents with 4 days of watery d/a, 3 day of epigastric pain, and today with intermittent chest tightness sometimes with the ab pain, sometimes without, but has occurred 4-5 times today and lasted several mins. D/a improved today from over 10x a day now to about 6 times today, loose watery stool.  Pain no worsened by eating. She also reports urinary frequency for 1 day.  No fever, cough.  Reports SOB, and changes pattern of her breathing while discussing her SOB.  On PE, VSS, pt in NAD.  Mild epigastic ttp on PE w/o rebound or guarding, nml BS.  Ddx includes gastroenteritis, gastritis, UTI, colitis.  Doubt SBO, cholelithiasis/cholecystits, appendicitis, diverticulitis, ACS.  CXR unremarkable.  Trop not elevated, CMP unremarkable, CBC w/ WBC 11.6  With similar elevations seen on prior labs. Given pt's benign PE, I do not feel imaging warrented.    12:05 AM Pt feeling somewhat improved, now having low ab pain.  With +leukocytes and urinary frequency, will treat for possible UTI.  Pt will be d/c home with return precautions for new or worsening symptoms. Rx for abx to given at time of d/c, but called to pharmacy.   1. Abdominal pain   2. Nausea vomiting and diarrhea   3. Chest tightness   4. UTI (lower urinary tract infection)      Labs and imaging considered in decision making, reviewed by myself.  Imaging interpreted by radiology. Pt care discussed with my attending, Dr. Rosalia Hammers.    Toy Cookey, MD 03/09/12 1520

## 2012-03-08 NOTE — ED Notes (Signed)
Pt dc to home.  Pt states understanding to dc instructions.  Pt ambulatory to exit without difficulty.  Pt will f/u with pcp as directed.

## 2012-03-10 ENCOUNTER — Encounter (HOSPITAL_COMMUNITY): Payer: Self-pay | Admitting: Emergency Medicine

## 2012-03-10 ENCOUNTER — Emergency Department (HOSPITAL_COMMUNITY)
Admission: EM | Admit: 2012-03-10 | Discharge: 2012-03-11 | Disposition: A | Discharge status: Home / Self Care | Payer: Medicare FFS | Attending: Emergency Medicine | Admitting: Emergency Medicine

## 2012-03-10 ENCOUNTER — Emergency Department (HOSPITAL_COMMUNITY): Payer: Medicare FFS

## 2012-03-10 DIAGNOSIS — Z8673 Personal history of transient ischemic attack (TIA), and cerebral infarction without residual deficits: Secondary | ICD-10-CM | POA: Insufficient documentation

## 2012-03-10 DIAGNOSIS — R110 Nausea: Secondary | ICD-10-CM | POA: Insufficient documentation

## 2012-03-10 DIAGNOSIS — K859 Acute pancreatitis without necrosis or infection, unspecified: Secondary | ICD-10-CM | POA: Insufficient documentation

## 2012-03-10 DIAGNOSIS — Z791 Long term (current) use of non-steroidal anti-inflammatories (NSAID): Secondary | ICD-10-CM | POA: Insufficient documentation

## 2012-03-10 DIAGNOSIS — Z79899 Other long term (current) drug therapy: Secondary | ICD-10-CM | POA: Insufficient documentation

## 2012-03-10 DIAGNOSIS — R42 Dizziness and giddiness: Secondary | ICD-10-CM | POA: Insufficient documentation

## 2012-03-10 DIAGNOSIS — I10 Essential (primary) hypertension: Secondary | ICD-10-CM | POA: Insufficient documentation

## 2012-03-10 DIAGNOSIS — R197 Diarrhea, unspecified: Secondary | ICD-10-CM | POA: Insufficient documentation

## 2012-03-10 DIAGNOSIS — Z792 Long term (current) use of antibiotics: Secondary | ICD-10-CM | POA: Insufficient documentation

## 2012-03-10 DIAGNOSIS — E669 Obesity, unspecified: Secondary | ICD-10-CM | POA: Insufficient documentation

## 2012-03-10 DIAGNOSIS — R0602 Shortness of breath: Secondary | ICD-10-CM | POA: Insufficient documentation

## 2012-03-10 DIAGNOSIS — F319 Bipolar disorder, unspecified: Secondary | ICD-10-CM | POA: Insufficient documentation

## 2012-03-10 LAB — COMPREHENSIVE METABOLIC PANEL
ALT: 26 U/L (ref 0–35)
BUN: 22 mg/dL (ref 6–23)
CO2: 25 mEq/L (ref 19–32)
Calcium: 8.8 mg/dL (ref 8.4–10.5)
Creatinine, Ser: 0.7 mg/dL (ref 0.50–1.10)
GFR calc Af Amer: >90 mL/min (ref >90)
GFR calc non Af Amer: >90 mL/min (ref >90)
Glucose, Bld: 101 mg/dL — ABNORMAL HIGH (ref 70–99)
Sodium: 141 mEq/L (ref 135–145)
Total Protein: 7.2 g/dL (ref 6.0–8.3)

## 2012-03-10 LAB — CBC WITH DIFFERENTIAL/PLATELET
Basophils Absolute: 0 10*3/uL (ref 0.0–0.1)
HCT: 36 % (ref 36.0–46.0)
Hemoglobin: 11.9 g/dL — ABNORMAL LOW (ref 12.0–15.0)
Lymphs Abs: 3.7 10*3/uL (ref 0.7–4.0)
Monocytes Absolute: 0.7 10*3/uL (ref 0.1–1.0)
Neutrophils Relative %: 61 % (ref 43–77)
RBC: 4.04 MIL/uL (ref 3.87–5.11)
WBC: 11.6 10*3/uL — ABNORMAL HIGH (ref 4.0–10.5)

## 2012-03-10 LAB — TROPONIN I: Troponin I: <0.3 ng/mL (ref <0.30)

## 2012-03-10 LAB — LIPASE, BLOOD: Lipase: 227 U/L — ABNORMAL HIGH (ref 11–59)

## 2012-03-10 MED ORDER — MORPHINE SULFATE 4 MG/ML IJ SOLN
4.0000 mg | Freq: Once | INTRAMUSCULAR | Status: AC
  Administered 2012-03-11: 4 mg via INTRAVENOUS
  Filled 2012-03-10: qty 1

## 2012-03-10 MED ORDER — IOHEXOL 300 MG/ML  SOLN
50.0000 mL | Freq: Once | INTRAMUSCULAR | Status: AC | PRN
  Administered 2012-03-10: 50 mL via ORAL

## 2012-03-10 MED ORDER — IOHEXOL 300 MG/ML  SOLN
100.0000 mL | Freq: Once | INTRAMUSCULAR | Status: AC | PRN
  Administered 2012-03-10: 100 mL via INTRAVENOUS

## 2012-03-10 MED ORDER — PANTOPRAZOLE SODIUM 40 MG IV SOLR
40.0000 mg | Freq: Once | INTRAVENOUS | Status: AC
  Administered 2012-03-10: 40 mg via INTRAVENOUS
  Filled 2012-03-10: qty 40

## 2012-03-10 MED ORDER — ONDANSETRON HCL 4 MG/2ML IJ SOLN
4.0000 mg | Freq: Once | INTRAMUSCULAR | Status: AC
  Administered 2012-03-10: 4 mg via INTRAVENOUS
  Filled 2012-03-10: qty 2

## 2012-03-10 MED ORDER — ONDANSETRON HCL 4 MG/2ML IJ SOLN
4.0000 mg | Freq: Once | INTRAMUSCULAR | Status: AC
  Administered 2012-03-11: 4 mg via INTRAVENOUS
  Filled 2012-03-10: qty 2

## 2012-03-10 MED ORDER — MORPHINE SULFATE 4 MG/ML IJ SOLN
4.0000 mg | Freq: Once | INTRAMUSCULAR | Status: AC
  Administered 2012-03-10: 4 mg via INTRAVENOUS
  Filled 2012-03-10: qty 1

## 2012-03-10 MED ORDER — FAMOTIDINE IN NACL 20-0.9 MG/50ML-% IV SOLN
20.0000 mg | Freq: Once | INTRAVENOUS | Status: AC
  Administered 2012-03-10: 20 mg via INTRAVENOUS
  Filled 2012-03-10: qty 50

## 2012-03-10 MED ORDER — SODIUM CHLORIDE 0.9 % IV BOLUS (SEPSIS)
1000.0000 mL | Freq: Once | INTRAVENOUS | Status: AC
  Administered 2012-03-10: 1000 mL via INTRAVENOUS

## 2012-03-10 NOTE — ED Provider Notes (Signed)
History     CSN: [id number]  Arrival date & time 03/10/12  1857   None     Chief Complaint  Patient presents with  . Abdominal Pain    (Consider location/radiation/quality/duration/timing/severity/associated sxs/prior treatment) HPI History provided by pt.   Pt has had persistent diarrhea for the past 7 days.  Was evaluated in the ED on 03/07/12, no imaging of abdomen performed at that time, sx thought to be secondary to gastroenteritis and pt d/c'd home w/ vicodin, zofran and cipro.  She has been compliant w/ abx.  Seen by her PCP today and another abx prescribed.  Has 8-9 watery BMs a day.  No blood or mucous in stool.  Associated w/ nausea and migrating abdominal pain.  Pain acutely worsened this evening while lying in bed and pt could not get comfortable.  Radiated into chest and accompanied by SOB.  Improved when she stood up, approx 15 minutes after onset.  However, when she stood up from bed, she developed lightheadedness and felt "out of it" for a short time.  She has not had fever, cough or GU sx.  No known h/o CAD but has had four CVAs.   Past Medical History  Diagnosis Date  . CVA (cerebral infarction)   . Depression   . Hypertension   . Bipolar disorder     Past Surgical History  Procedure Laterality Date  . Back surgery      History reviewed. No pertinent family history.  History  Substance Use Topics  . Smoking status: Never Smoker   . Smokeless tobacco: Not on file  . Alcohol Use: Yes    OB History   Grav Para Term Preterm Abortions TAB SAB Ect Mult Living                  Review of Systems  All other systems reviewed and are negative.    Allergies  Cefazolin  Home Medications   Current Outpatient Rx  Name  Route  Sig  Dispense  Refill  . albuterol (PROVENTIL HFA;VENTOLIN HFA) 108 (90 BASE) MCG/ACT inhaler   Inhalation   Inhale 2 puffs into the lungs every 6 (six) hours as needed for wheezing.         Marland Kitchen ALPRAZolam (XANAX) 0.5 MG tablet  Oral   Take 0.5 mg by mouth 2 (two) times daily as needed for anxiety.          Marland Kitchen amLODipine-valsartan (EXFORGE) 5-320 MG per tablet   Oral   Take 1 tablet by mouth every morning.           . Calcium-Vitamin D-Iron (RA CALCIUM/IRON/VITAMIN D PO)   Oral   Take 1 tablet by mouth daily.         . ciprofloxacin (CIPRO) 500 MG tablet   Oral   Take 1 tablet (500 mg total) by mouth 2 (two) times daily.   6 tablet   0   . divalproex (DEPAKOTE) 250 MG DR tablet   Oral   Take 750 mg by mouth at bedtime.          Marland Kitchen HYDROcodone-acetaminophen (NORCO) 5-325 MG per tablet   Oral   Take 2 tablets by mouth every 4 (four) hours as needed for pain.   6 tablet   0   . meloxicam (MOBIC) 15 MG tablet   Oral   Take 15 mg by mouth daily.         . ondansetron (ZOFRAN) 4 MG tablet  Oral   Take 1 tablet (4 mg total) by mouth every 6 (six) hours.   12 tablet   0   . topiramate (TOPAMAX) 50 MG tablet   Oral   Take 50-100 mg by mouth 2 (two) times daily. 1 in the morning, 2 at bedtime.         . traMADol (ULTRAM) 50 MG tablet   Oral   Take 50 mg by mouth 4 (four) times daily as needed for pain. For pain         . zolpidem (AMBIEN) 10 MG tablet   Oral   Take 10 mg by mouth at bedtime as needed for sleep.         . DULoxetine (CYMBALTA) 60 MG capsule   Oral   Take 60 mg by mouth daily.           BP 112/50  Pulse 69  Temp(Src) 98.3 F (36.8 C) (Oral)  Resp 18  Ht 5\' 1"  (1.549 m)  Wt 223 lb (101.152 kg)  BMI 42.16 kg/m2  SpO2 100%  Physical Exam  Nursing note and vitals reviewed. Constitutional: She is oriented to person, place, and time. She appears well-developed and well-nourished. No distress.  HENT:  Head: Normocephalic and atraumatic.  Eyes:  Normal appearance  Neck: Normal range of motion.  Cardiovascular: Normal rate and regular rhythm.   Pulmonary/Chest: Effort normal and breath sounds normal. No respiratory distress.  Abdominal: Soft. Bowel  sounds are normal. She exhibits no distension and no mass. There is no rebound and no guarding.  Obese.  Diffuse ttp.   Genitourinary:  No CVA tenderness  Musculoskeletal: Normal range of motion.  Neurological: She is alert and oriented to person, place, and time.  Skin: Skin is warm and dry. No rash noted.  Psychiatric: She has a normal mood and affect. Her behavior is normal.    ED Course  Procedures (including critical care time)  Labs Reviewed  CBC WITH DIFFERENTIAL - Abnormal; Notable for the following:    WBC 11.6 (*)    Hemoglobin 11.9 (*)    RDW 15.9 (*)    All other components within normal limits  COMPREHENSIVE METABOLIC PANEL - Abnormal; Notable for the following:    Glucose, Bld 101 (*)    Albumin 3.4 (*)    Total Bilirubin 0.1 (*)    All other components within normal limits  LIPASE, BLOOD - Abnormal; Notable for the following:    Lipase 227 (*)    All other components within normal limits  TROPONIN I  URINALYSIS, ROUTINE W REFLEX MICROSCOPIC   US Abdomen Complete  03/11/2012  *RADIOLOGY REPORT*  Clinical Data:  Abdominal pain and nausea.  COMPLETE ABDOMINAL ULTRASOUND  Comparison:  CT abdomen and pelvis 03/10/2012  Findings:  Gallbladder:  No gallstones, gallbladder wall thickening, or pericholecystic fluid.  Common bile duct:  Normal caliber with measured diameter of up to 7 mm.  Liver:  Heterogeneous parenchymal echotexture suggesting fatty infiltration.  IVC:  Appears normal.  Pancreas:  Mild prominence of pancreatic duct.  Pancreatitis not excluded.  No peripancreatic fluid collections.  Spleen:  Spleen length measures 6 cm.  Normal parenchymal echotexture.  Right Kidney:  Right kidney measures 12.3 cm length.  No hydronephrosis.  Left Kidney:  Left kidney measures 10.3 cm length.  No hydronephrosis.  Abdominal aorta:  Visualized abdominal aorta is nonaneurysmal. Distally aorta is obscured by overlying bowel gas.  IMPRESSION: Heterogeneous liver parenchymal  echotexture suggesting fatty infiltration.  Mild  prominence of the pancreatic duct is nonspecific.  Pancreatitis is not excluded.   Original Report Authenticated By: Burman Nieves, M.D.    Ct Abdomen Pelvis W Contrast  03/10/2012  *RADIOLOGY REPORT*  Clinical Data: 52 year old female with abdominal and pelvic pain, nausea and diarrhea.  CT ABDOMEN AND PELVIS WITH CONTRAST  Technique:  Multidetector CT imaging of the abdomen and pelvis was performed following the standard protocol during bolus administration of intravenous contrast.  Contrast: OMNIPAQUE IOHEXOL 300 MG/ML  SOLN  Comparison: 05/27/2009 pelvic CT  Findings: The liver, spleen, pancreas, gallbladder, adrenal glands and kidneys are unremarkable.  No free fluid, enlarged lymph nodes, biliary dilation or abdominal aortic aneurysm identified.  The bowel, appendix and bladder are unremarkable. The patient is status post hysterectomy.  There is no evidence of acute or suspicious bony abnormality.  L5- S1 fusion again noted.  IMPRESSION: No evidence of acute abnormality.   Original Report Authenticated By: Harmon Pier, M.D.      1. Pancreatitis       MDM  [phone] F w/ h/o CVA and HTN presents w/ diffuse abdominal pain, nausea and diarrhea x 7 days, pain acutely worsened today.  On exam, afebrile, mildly hypotensive (discharge BP appears to be at baseline based on prior chart), abd soft but diffusely ttp.  Lab sig for elevated lipase.  CT abd/pelvis ordered d/t duration/course of sx and is neg for acute pathology.  Pt does not drink alcohol and has no prior h/o pancreatitis.  On re-evaluation, after receiving a GI cocktail, IVF, protonix, pepcid, morphine and zofran, pain and nausea have improved very little and exam unchanged, with exception that tenderness now most severe in RUQ.  [place] abd ordered to r/o gallstone pancreatitis.    US findings consistent w/ pancreatitis; no cholelithiasis.  Results discussed w/ pt and her family.  Will treat  sypmtomatically for pancreatitis.  Recommended clear fluid diet x 48 hours and f/u with her PCP and prescribed percocet and phenergan.  She is tolerating pos.          Otilio Miu, PA-C 03/11/12 [phone]

## 2012-03-10 NOTE — ED Notes (Signed)
Pt c/o generalized upper abd pain the same as Saturday when seen last; pt sts saw PCP today and then feeling worse tonight

## 2012-03-11 ENCOUNTER — Emergency Department (HOSPITAL_COMMUNITY): Payer: Medicare FFS

## 2012-03-11 LAB — URINALYSIS, ROUTINE W REFLEX MICROSCOPIC
Glucose, UA: NEGATIVE mg/dL
Ketones, ur: NEGATIVE mg/dL
Leukocytes, UA: NEGATIVE
Nitrite: NEGATIVE
Protein, ur: NEGATIVE mg/dL
Urobilinogen, UA: 0.2 mg/dL (ref 0.0–1.0)

## 2012-03-11 MED ORDER — PROMETHAZINE HCL 25 MG PO TABS: 25.0000 mg | ORAL_TABLET | Freq: Four times a day (QID) | ORAL | Status: DC | PRN

## 2012-03-11 MED ORDER — OXYCODONE-ACETAMINOPHEN 5-325 MG PO TABS
2.0000 | ORAL_TABLET | Freq: Once | ORAL | Status: AC
  Administered 2012-03-11: 2 via ORAL
  Filled 2012-03-11: qty 2

## 2012-03-11 MED ORDER — OXYCODONE-ACETAMINOPHEN 5-325 MG PO TABS
1.0000 | ORAL_TABLET | ORAL | Status: DC | PRN
Start: 2012-03-11 — End: 2012-03-13

## 2012-03-11 MED ORDER — PROMETHAZINE HCL 25 MG PO TABS
25.0000 mg | ORAL_TABLET | Freq: Once | ORAL | Status: AC
  Administered 2012-03-11: 25 mg via ORAL
  Filled 2012-03-11: qty 1

## 2012-03-11 NOTE — ED Notes (Signed)
The patient is AOx4 and comfortable with her discharge instructions. 

## 2012-03-11 NOTE — ED Provider Notes (Signed)
Medical screening examination/treatment/procedure(s) were performed by non-physician practitioner and as supervising physician I was immediately available for consultation/collaboration.    Arlina Sabina L Nychelle Cassata, MD 03/11/12 1516 

## 2012-03-11 NOTE — ED Provider Notes (Signed)
History/physical exam/procedure(s) were performed by non-physician practitioner and as supervising physician I was immediately available for consultation/collaboration. I have reviewed all notes and am in agreement with care and plan. I saw and evaluated the patient, reviewed the resident's note and I agree with the findings and plan.  52 y.o. Female with nausea vomiting diarrhea.  No acute abnormalities on exam.    Hilario Quarry, MD 03/11/12 [phone]

## 2012-03-13 ENCOUNTER — Encounter (HOSPITAL_COMMUNITY): Payer: Self-pay

## 2012-03-13 ENCOUNTER — Emergency Department (HOSPITAL_COMMUNITY)
Admission: EM | Admit: 2012-03-13 | Discharge: 2012-03-13 | Disposition: A | Discharge status: Home / Self Care | Payer: Medicare FFS | Attending: Emergency Medicine | Admitting: Emergency Medicine

## 2012-03-13 DIAGNOSIS — R110 Nausea: Secondary | ICD-10-CM | POA: Insufficient documentation

## 2012-03-13 DIAGNOSIS — I10 Essential (primary) hypertension: Secondary | ICD-10-CM | POA: Insufficient documentation

## 2012-03-13 DIAGNOSIS — R197 Diarrhea, unspecified: Secondary | ICD-10-CM | POA: Insufficient documentation

## 2012-03-13 DIAGNOSIS — F319 Bipolar disorder, unspecified: Secondary | ICD-10-CM | POA: Insufficient documentation

## 2012-03-13 DIAGNOSIS — Z8673 Personal history of transient ischemic attack (TIA), and cerebral infarction without residual deficits: Secondary | ICD-10-CM | POA: Insufficient documentation

## 2012-03-13 DIAGNOSIS — Z79899 Other long term (current) drug therapy: Secondary | ICD-10-CM | POA: Insufficient documentation

## 2012-03-13 LAB — COMPREHENSIVE METABOLIC PANEL
ALT: 22 U/L (ref 0–35)
AST: 18 U/L (ref 0–37)
CO2: 21 mEq/L (ref 19–32)
Chloride: 110 mEq/L (ref 96–112)
GFR calc non Af Amer: >90 mL/min (ref >90)
Glucose, Bld: 117 mg/dL — ABNORMAL HIGH (ref 70–99)
Sodium: 141 mEq/L (ref 135–145)
Total Bilirubin: 0.1 mg/dL — ABNORMAL LOW (ref 0.3–1.2)

## 2012-03-13 LAB — CBC WITH DIFFERENTIAL/PLATELET
Basophils Absolute: 0 10*3/uL (ref 0.0–0.1)
HCT: 35.6 % — ABNORMAL LOW (ref 36.0–46.0)
Lymphocytes Relative: 38 % (ref 12–46)
Lymphs Abs: 3.8 10*3/uL (ref 0.7–4.0)
Monocytes Absolute: 0.6 10*3/uL (ref 0.1–1.0)
Neutro Abs: 5.2 10*3/uL (ref 1.7–7.7)
Platelets: 217 10*3/uL (ref 150–400)
RBC: 4.01 MIL/uL (ref 3.87–5.11)
RDW: 15.9 % — ABNORMAL HIGH (ref 11.5–15.5)
WBC: 10 10*3/uL (ref 4.0–10.5)

## 2012-03-13 LAB — CLOSTRIDIUM DIFFICILE BY PCR: Toxigenic C. Difficile by PCR: NEGATIVE

## 2012-03-13 MED ORDER — MORPHINE SULFATE 4 MG/ML IJ SOLN
4.0000 mg | Freq: Once | INTRAMUSCULAR | Status: AC
  Administered 2012-03-13: 4 mg via INTRAVENOUS
  Filled 2012-03-13: qty 1

## 2012-03-13 MED ORDER — ALBUTEROL SULFATE HFA 108 (90 BASE) MCG/ACT IN AERS: 2.0000 | INHALATION_SPRAY | Freq: Four times a day (QID) | RESPIRATORY_TRACT | Status: DC | PRN

## 2012-03-13 MED ORDER — SODIUM CHLORIDE 0.9 % IV BOLUS (SEPSIS)
1000.0000 mL | Freq: Once | INTRAVENOUS | Status: AC
  Administered 2012-03-13: 1000 mL via INTRAVENOUS

## 2012-03-13 NOTE — ED Notes (Signed)
Pt c/o diarrhea x10 days, seen here Saturday and Tuesday for the same. Pt reports they have done a complete work up and encouraged to f/u w/a GI specialist. Pt called her PCP today and instructed her to return to the ED for further evaluation.

## 2012-03-13 NOTE — ED Notes (Signed)
C/o worsening abdominal pain, radiating to lower back, nausea and diarrhea, denies vomiting. Pain is across lower abdomen and epigastric pain. States that stool is black. Decrease in appetite but able to take fluids.

## 2012-03-13 NOTE — ED Provider Notes (Signed)
History     CSN: [id number]  Arrival date & time 03/13/12  1314   First MD Initiated Contact with Patient 03/13/12 1514    Chief Complaint  Patient presents with  . Abdominal Pain    HPI Patient presents with 10 days of diarrhea. The diarrhea occurs approximately 10 times per day. Nonbloody. Associated with nausea but no vomiting. 8 days ago developed moderate diffuse abdominal pain. This is patient's third visit to ED in this time. Previous workup revealed lipase greater than 200. Ultrasound negative for gallstones. CT of the abdomen and pelvis negative. Other labs unremarkable. Last time was seen here was rehydrated and given morphine. The patient has yet to follow up with PCP 2/2 inability to get appointment.  Now here because they didn't know what else to do.  Past Medical History  Diagnosis Date  . CVA (cerebral infarction)   . Depression   . Hypertension   . Bipolar disorder     Past Surgical History  Procedure Laterality Date  . Back surgery     Family History: Reviewed.  None pertinent.    History  Substance Use Topics  . Smoking status: Never Smoker   . Smokeless tobacco: Not on file  . Alcohol Use: Yes    OB History   Grav Para Term Preterm Abortions TAB SAB Ect Mult Living                  Review of Systems  Constitutional: Negative for fever and chills.  HENT: Negative for congestion, rhinorrhea, neck pain and neck stiffness.   Respiratory: Negative for cough and shortness of breath.   Cardiovascular: Negative for chest pain.  Gastrointestinal: Positive for nausea, abdominal pain and diarrhea. Negative for vomiting and abdominal distention.  Endocrine: Negative for polyuria.  Genitourinary: Negative for dysuria.  Skin: Negative for rash.  Neurological: Negative for headaches.  Psychiatric/Behavioral: Negative.   All other systems reviewed and are negative.    Allergies  Cefazolin  Home Medications   Current Outpatient Rx  Name  Route  Sig   Dispense  Refill  . albuterol (PROVENTIL HFA;VENTOLIN HFA) 108 (90 BASE) MCG/ACT inhaler   Inhalation   Inhale 2 puffs into the lungs every 6 (six) hours as needed for wheezing.         Marland Kitchen ALPRAZolam (XANAX) 0.5 MG tablet   Oral   Take 0.5 mg by mouth 2 (two) times daily as needed for anxiety.          Marland Kitchen amLODipine-valsartan (EXFORGE) 5-320 MG per tablet   Oral   Take 1 tablet by mouth every morning.           . Calcium-Vitamin D-Iron (RA CALCIUM/IRON/VITAMIN D PO)   Oral   Take 1 tablet by mouth daily.         . ciprofloxacin (CIPRO) 500 MG tablet   Oral   Take 1 tablet (500 mg total) by mouth 2 (two) times daily.   6 tablet   0   . divalproex (DEPAKOTE) 250 MG DR tablet   Oral   Take 750 mg by mouth at bedtime.          . DULoxetine (CYMBALTA) 60 MG capsule   Oral   Take 60 mg by mouth daily.         Marland Kitchen HYDROcodone-acetaminophen (NORCO) 5-325 MG per tablet   Oral   Take 2 tablets by mouth every 4 (four) hours as needed for pain.   6 tablet  0   . meloxicam (MOBIC) 15 MG tablet   Oral   Take 15 mg by mouth daily.         . ondansetron (ZOFRAN) 4 MG tablet   Oral   Take 1 tablet (4 mg total) by mouth every 6 (six) hours.   12 tablet   0   . oxyCODONE-acetaminophen (PERCOCET/ROXICET) 5-325 MG per tablet   Oral   Take 1 tablet by mouth every 4 (four) hours as needed for pain.   20 tablet   0   . promethazine (PHENERGAN) 25 MG tablet   Oral   Take 1 tablet (25 mg total) by mouth every 6 (six) hours as needed for nausea.   20 tablet   0   . topiramate (TOPAMAX) 50 MG tablet   Oral   Take 50-100 mg by mouth 2 (two) times daily. 1 in the morning, 2 at bedtime.         . traMADol (ULTRAM) 50 MG tablet   Oral   Take 50 mg by mouth 4 (four) times daily as needed for pain. For pain         . zolpidem (AMBIEN) 10 MG tablet   Oral   Take 10 mg by mouth at bedtime as needed for sleep.           BP 110/61  Pulse 68  Temp(Src) 97.7 F  (36.5 C) (Oral)  Resp 20  SpO2 96%  Physical Exam  Nursing note and vitals reviewed. Constitutional: She is oriented to person, place, and time. She appears well-developed and well-nourished. No distress.  HENT:  Head: Normocephalic and atraumatic.  Right Ear: External ear normal.  Left Ear: External ear normal.  Nose: Nose normal.  Mouth/Throat: Oropharynx is clear and moist. No oropharyngeal exudate.  Eyes: EOM are normal. Pupils are equal, round, and reactive to light.  Neck: Normal range of motion. Neck supple. No tracheal deviation present.  Cardiovascular: Normal rate.   Pulmonary/Chest: Effort normal and breath sounds normal. No stridor. No respiratory distress. She has no wheezes. She has no rales.  Abdominal: Soft. She exhibits no distension. There is no tenderness. There is no rebound.  Musculoskeletal: Normal range of motion.  Neurological: She is alert and oriented to person, place, and time.  Skin: Skin is warm and dry. She is not diaphoretic.    ED Course  Procedures (including critical care time)  Labs Reviewed  CBC WITH DIFFERENTIAL - Abnormal; Notable for the following:    Hemoglobin 11.9 (*)    HCT 35.6 (*)    RDW 15.9 (*)    All other components within normal limits  COMPREHENSIVE METABOLIC PANEL - Abnormal; Notable for the following:    Glucose, Bld 117 (*)    Albumin 3.4 (*)    Total Bilirubin 0.1 (*)    All other components within normal limits  LIPASE, BLOOD - Abnormal; Notable for the following:    Lipase 117 (*)    All other components within normal limits  CLOSTRIDIUM DIFFICILE BY PCR  STOOL CULTURE  OVA AND PARASITE EXAMINATION   No results found.   1. Diarrhea     MDM   Patient is a 52 year old female who presents with 10 days of diarrhea. Patient recently seen in ED twice before this. Most recently he was dehydrated and responded well to a liter of fluids and pain medicine. Abdominal exam benign vital signs within normal limits. The  patient already with GI followup scheduled in 2  days. Rehydrated patient with 1 L normal saline uncontrolled pain. Labs mostly unremarkable or unchanged. Lipase elevated last time and actually decreased to 117 on this encounter.  Patient reexamined after rehydration and asymptomatic feeling much better. Stool studies sent off and will be available for gastroenterology followup. Patient safe for discharge home. Doubt serious intra-abdominal cause of pain and diarrhea. Patient discharged.       Arloa Koh, MD 03/14/12 0000

## 2012-03-14 NOTE — ED Provider Notes (Signed)
I saw and evaluated the patient, reviewed the resident's note and I agree with the findings and plan.  The patient presents with a 10 day history of abdominal pain and diarrhea.  She was been seen by her pcp and has had ct scan and labs but no cause has been found.  All has been non-bloody.    On exam, the patient is afebrile and the vitals are stable.  The heart and lung exam is unremarkable.  The abdomen reveals ttp in all four quadrants, but no rebound or guarding.  The mucous membranes are moist and skin turgor is good.  The patient presents here with generalized abd pain and diarrhea for the past 10 days.  Workups in the past and today's labs are unremarkable.  While in the ED, the patient received a call stating that she was given and appointment for GI on Monday.  I do not feel as though further imaging is indicated today, given the prompt follow up, benign exam, and unchanged labs.  We were able to obtain a stool culture which may assist GI in identifying the cause of her symptoms.  Was hydrated, pain treated, and will follow up on Monday as scheduled.  Geoffery Lyons, MD 03/14/12 [phone]

## 2012-03-16 LAB — OVA AND PARASITE EXAMINATION: Ova and parasites: NONE SEEN

## 2012-04-21 ENCOUNTER — Other Ambulatory Visit: Payer: Self-pay | Admitting: Gastroenterology

## 2012-05-04 ENCOUNTER — Other Ambulatory Visit: Payer: Self-pay

## 2012-05-04 MED ORDER — TOPIRAMATE 50 MG PO TABS: ORAL_TABLET | ORAL | Status: DC

## 2012-06-15 ENCOUNTER — Encounter (HOSPITAL_COMMUNITY): Payer: Self-pay | Admitting: Emergency Medicine

## 2012-06-15 ENCOUNTER — Emergency Department (HOSPITAL_COMMUNITY): Payer: Medicare PPO

## 2012-06-15 DIAGNOSIS — F319 Bipolar disorder, unspecified: Secondary | ICD-10-CM | POA: Insufficient documentation

## 2012-06-15 DIAGNOSIS — Z791 Long term (current) use of non-steroidal anti-inflammatories (NSAID): Secondary | ICD-10-CM | POA: Insufficient documentation

## 2012-06-15 DIAGNOSIS — M25519 Pain in unspecified shoulder: Secondary | ICD-10-CM | POA: Insufficient documentation

## 2012-06-15 DIAGNOSIS — Z8673 Personal history of transient ischemic attack (TIA), and cerebral infarction without residual deficits: Secondary | ICD-10-CM | POA: Insufficient documentation

## 2012-06-15 DIAGNOSIS — Z79899 Other long term (current) drug therapy: Secondary | ICD-10-CM | POA: Insufficient documentation

## 2012-06-15 DIAGNOSIS — I10 Essential (primary) hypertension: Secondary | ICD-10-CM | POA: Insufficient documentation

## 2012-06-15 NOTE — ED Notes (Signed)
PT. REPORTS LEFT SHOULDER JOINT PAIN / SWELLING FOR SEVERAL DAYS UNRELIEVED BY PRESCRIPTION PAIN MEDICATION , RECEIVED CORTIZONE SHOT AT [facility] TODAY  AT LEFT SHOULDER , PAIN WORSE WITH MOVEMENT /CERTAIN POSITIONS . DENIES INJURY OR FALL.

## 2012-06-16 ENCOUNTER — Emergency Department (HOSPITAL_COMMUNITY)
Admission: EM | Admit: 2012-06-16 | Discharge: 2012-06-16 | Disposition: A | Discharge status: Home / Self Care | Payer: Medicare PPO | Attending: Emergency Medicine | Admitting: Emergency Medicine

## 2012-06-16 DIAGNOSIS — M25512 Pain in left shoulder: Secondary | ICD-10-CM

## 2012-06-16 MED ORDER — HYDROMORPHONE HCL PF 1 MG/ML IJ SOLN
1.0000 mg | Freq: Once | INTRAMUSCULAR | Status: AC
  Administered 2012-06-16: 1 mg via INTRAMUSCULAR
  Filled 2012-06-16: qty 1

## 2012-06-16 NOTE — ED Provider Notes (Signed)
Medical screening examination/treatment/procedure(s) were performed by non-physician practitioner and as supervising physician I was immediately available for consultation/collaboration.  Hillery Zachman M Leven Hoel, MD 06/16/12 0725 

## 2012-06-16 NOTE — ED Provider Notes (Signed)
History     CSN: [id number]  Arrival date & time 06/15/12  2246   None     Chief Complaint  Patient presents with  . Shoulder Pain    (Consider location/radiation/quality/duration/timing/severity/associated sxs/prior treatment) HPI History provided by pt.   Pt presents w/ severe L shoulder pain.  Pain started 7 days ago, received a cortisone shot today, and pain acutely worsened.  Aggravated by minimal ROM as well as palpation.  No associated skin changes or fever.  No relief w/ vicodin.  Past Medical History  Diagnosis Date  . CVA (cerebral infarction)   . Depression   . Hypertension   . Bipolar disorder     Past Surgical History  Procedure Laterality Date  . Back surgery      No family history on file.  History  Substance Use Topics  . Smoking status: Never Smoker   . Smokeless tobacco: Not on file  . Alcohol Use: Yes    OB History   Grav Para Term Preterm Abortions TAB SAB Ect Mult Living                  Review of Systems  All other systems reviewed and are negative.    Allergies  Cefazolin  Home Medications   Current Outpatient Rx  Name  Route  Sig  Dispense  Refill  . albuterol (PROVENTIL HFA;VENTOLIN HFA) 108 (90 BASE) MCG/ACT inhaler   Inhalation   Inhale 2 puffs into the lungs every 6 (six) hours as needed for wheezing or shortness of breath.   1 Inhaler   1   . ALPRAZolam (XANAX) 0.5 MG tablet   Oral   Take 0.5 mg by mouth 2 (two) times daily as needed for anxiety.          Marland Kitchen amLODipine-valsartan (EXFORGE) 5-320 MG per tablet   Oral   Take 1 tablet by mouth every morning.           . Calcium-Vitamin D-Iron (RA CALCIUM/IRON/VITAMIN D PO)   Oral   Take 1 tablet by mouth daily.         . ciprofloxacin (CIPRO) 500 MG tablet   Oral   Take 500 mg by mouth 2 (two) times daily. For 10 days; Start date 03/08/12         . divalproex (DEPAKOTE) 250 MG DR tablet   Oral   Take 750 mg by mouth at bedtime.          . DULoxetine  (CYMBALTA) 60 MG capsule   Oral   Take 60 mg by mouth daily.         Marland Kitchen HYDROcodone-acetaminophen (NORCO) 5-325 MG per tablet   Oral   Take 2 tablets by mouth every 4 (four) hours as needed for pain.   6 tablet   0   . meloxicam (MOBIC) 15 MG tablet   Oral   Take 15 mg by mouth daily.         . ondansetron (ZOFRAN) 4 MG tablet   Oral   Take 1 tablet (4 mg total) by mouth every 6 (six) hours.   12 tablet   0   . oxyCODONE-acetaminophen (PERCOCET/ROXICET) 5-325 MG per tablet   Oral   Take 1 tablet by mouth every 4 (four) hours as needed for pain.         . promethazine (PHENERGAN) 25 MG tablet   Oral   Take 1 tablet (25 mg total) by mouth every 6 (six) hours as  needed for nausea.   20 tablet   0   . topiramate (TOPAMAX) 50 MG tablet      1 in the morning, 2 at bedtime.   90 tablet   4   . traMADol (ULTRAM) 50 MG tablet   Oral   Take 50 mg by mouth 4 (four) times daily as needed for pain. For pain         . zolpidem (AMBIEN) 10 MG tablet   Oral   Take 10 mg by mouth at bedtime as needed for sleep.           BP 138/89  Pulse 59  Temp(Src) 98.4 F (36.9 C) (Oral)  Resp 18  SpO2 97%  Physical Exam  Nursing note and vitals reviewed. Constitutional: She is oriented to person, place, and time. She appears well-developed and well-nourished. No distress.  HENT:  Head: Normocephalic and atraumatic.  Eyes:  Normal appearance  Neck: Normal range of motion.  Pulmonary/Chest: Effort normal.  Musculoskeletal: Normal range of motion.  L shoulder w/out deformity, edema or skin changes.  Injection site is not visible. Diffusely ttp.  Pain w/ passive ROM.  Nml elbow and wrist.  2+ radial pulse and distal sensation intact.      Neurological: She is alert and oriented to person, place, and time.  Psychiatric: She has a normal mood and affect. Her behavior is normal.    ED Course  Procedures (including critical care time)  Labs Reviewed - No data to  display Dg Shoulder Left  06/16/2012   *RADIOLOGY REPORT*  Clinical Data: Left shoulder pain.  LEFT SHOULDER - 2+ VIEW  Comparison: No priors.  Findings: Three views of the left shoulder demonstrate no acute displaced fracture, subluxation, dislocation, joint or soft tissue abnormality.  IMPRESSION: 1.  No acute radiographic abnormality of the left shoulder.   Original Report Authenticated By: Trudie Reed, M.D.     1. Pain in left shoulder       MDM  [phone] F presents w/ L shoulder pain, s/p cortisone shot today.  No signs of joint infection on exam.  Xray obtained by nursing staff and is negative.  Treated w/ 1mg  IM dilaudid.  Ortho tech provided w/ sling for comfort.  Pt has vicodin at home and I recommended low dose NSAID and ice as well.  Referred to ortho for persistent sx.  Return precautions discussed.         Otilio Miu, PA-C 06/16/12 [phone]

## 2012-07-07 ENCOUNTER — Emergency Department (HOSPITAL_COMMUNITY)
Admission: EM | Admit: 2012-07-07 | Discharge: 2012-07-07 | Disposition: A | Discharge status: Home / Self Care | Payer: Medicare PPO | Attending: Emergency Medicine | Admitting: Emergency Medicine

## 2012-07-07 ENCOUNTER — Encounter (HOSPITAL_COMMUNITY): Payer: Self-pay

## 2012-07-07 DIAGNOSIS — K0889 Other specified disorders of teeth and supporting structures: Secondary | ICD-10-CM

## 2012-07-07 DIAGNOSIS — R599 Enlarged lymph nodes, unspecified: Secondary | ICD-10-CM | POA: Insufficient documentation

## 2012-07-07 DIAGNOSIS — K089 Disorder of teeth and supporting structures, unspecified: Secondary | ICD-10-CM | POA: Insufficient documentation

## 2012-07-07 DIAGNOSIS — R220 Localized swelling, mass and lump, head: Secondary | ICD-10-CM | POA: Insufficient documentation

## 2012-07-07 DIAGNOSIS — I10 Essential (primary) hypertension: Secondary | ICD-10-CM | POA: Insufficient documentation

## 2012-07-07 DIAGNOSIS — Z79899 Other long term (current) drug therapy: Secondary | ICD-10-CM | POA: Insufficient documentation

## 2012-07-07 DIAGNOSIS — H9209 Otalgia, unspecified ear: Secondary | ICD-10-CM | POA: Insufficient documentation

## 2012-07-07 DIAGNOSIS — Z8673 Personal history of transient ischemic attack (TIA), and cerebral infarction without residual deficits: Secondary | ICD-10-CM | POA: Insufficient documentation

## 2012-07-07 DIAGNOSIS — K029 Dental caries, unspecified: Secondary | ICD-10-CM | POA: Insufficient documentation

## 2012-07-07 DIAGNOSIS — F319 Bipolar disorder, unspecified: Secondary | ICD-10-CM | POA: Insufficient documentation

## 2012-07-07 DIAGNOSIS — R221 Localized swelling, mass and lump, neck: Secondary | ICD-10-CM | POA: Insufficient documentation

## 2012-07-07 MED ORDER — OXYCODONE-ACETAMINOPHEN 5-325 MG PO TABS
2.0000 | ORAL_TABLET | Freq: Once | ORAL | Status: AC
  Administered 2012-07-07: 2 via ORAL
  Filled 2012-07-07: qty 2

## 2012-07-07 MED ORDER — PENICILLIN V POTASSIUM 250 MG PO TABS
500.0000 mg | ORAL_TABLET | Freq: Once | ORAL | Status: AC
  Administered 2012-07-07: 500 mg via ORAL
  Filled 2012-07-07: qty 1

## 2012-07-07 MED ORDER — PENICILLIN V POTASSIUM 500 MG PO TABS: 500.0000 mg | ORAL_TABLET | Freq: Four times a day (QID) | ORAL | Status: DC

## 2012-07-07 MED ORDER — HYDROCODONE-ACETAMINOPHEN 5-325 MG PO TABS: 1.0000 | ORAL_TABLET | ORAL | Status: DC | PRN

## 2012-07-07 NOTE — ED Notes (Signed)
PT ambulated with baseline gait; VSS; A&Ox3; no signs of distress; respirations even and unlabored; skin warm and dry; no questions upon discharge.  

## 2012-07-07 NOTE — ED Provider Notes (Signed)
History    This chart was scribed for a non-physician practitioner, Dierdre Forth, PA-C, working with Carleene Cooper III, MD by Frederik Pear, ED Scribe. This patient was seen in room TR09C/TR09C and the patient's care was started at 2132.  CSN: [id number] Arrival date & time 07/07/12  4098  First MD Initiated Contact with Patient 07/07/12 2132     Chief Complaint  Patient presents with  . Dental Pain   (Consider location/radiation/quality/duration/timing/severity/associated sxs/prior Treatment) Patient is a 52 y.o. female presenting with tooth pain. The history is provided by the patient and medical records. No language interpreter was used.  Dental Pain Location:  Upper and lower Severity:  Severe Onset quality:  Sudden Duration:  2 weeks Timing:  Constant Progression:  Worsening Chronicity:  New Context: not abscess   Associated symptoms: facial swelling   Associated symptoms: no drooling, no fever and no neck pain     HPI Comments: Autumn Valenzuela is a 52 y.o. female who presents to the Emergency Department complaining of gradually worsening, constant, non-radiating right upper and lower dental pain with associated right-sided jaw pain and facial swelling that is aggravated by eating and brushing her teeth and alleviated by nothing that began 2 weeks ago. Denies fever, chills, nausea, and emesis. She treat the pain by applying a heating pad along the right side of her face, pain medication, and gargling salt water and vinegar water. She reports that she has a dentist appointment on 07/01.   Past Medical History  Diagnosis Date  . CVA (cerebral infarction)   . Depression   . Hypertension   . Bipolar disorder    Past Surgical History  Procedure Laterality Date  . Back surgery     History reviewed. No pertinent family history. History  Substance Use Topics  . Smoking status: Never Smoker   . Smokeless tobacco: Not on file  . Alcohol Use: Yes   OB History   Grav  Para Term Preterm Abortions TAB SAB Ect Mult Living                 Review of Systems  Constitutional: Negative for fever, chills and appetite change.  HENT: Positive for ear pain, facial swelling and dental problem. Negative for nosebleeds, rhinorrhea, drooling, trouble swallowing, neck pain, neck stiffness and postnasal drip.   Eyes: Negative for pain and redness.  Respiratory: Negative for cough and wheezing.   Cardiovascular: Negative for chest pain.  Gastrointestinal: Negative for nausea, vomiting and abdominal pain.  Skin: Negative for color change and rash.  Neurological: Negative for weakness and light-headedness.  All other systems reviewed and are negative.    Allergies  Cefazolin  Home Medications   Current Outpatient Rx  Name  Route  Sig  Dispense  Refill  . albuterol (PROVENTIL HFA;VENTOLIN HFA) 108 (90 BASE) MCG/ACT inhaler   Inhalation   Inhale 2 puffs into the lungs daily as needed for wheezing or shortness of breath.         Marland Kitchen albuterol (PROVENTIL) (2.5 MG/3ML) 0.083% nebulizer solution   Nebulization   Take 2.5 mg by nebulization at bedtime as needed for wheezing.         Marland Kitchen ALPRAZolam (XANAX) 0.5 MG tablet   Oral   Take 0.5 mg by mouth 2 (two) times daily.          Marland Kitchen amLODipine-valsartan (EXFORGE) 5-320 MG per tablet   Oral   Take 1 tablet by mouth daily.          Marland Kitchen  Calcium-Vitamin D-Iron (RA CALCIUM/IRON/VITAMIN D PO)   Oral   Take 1 tablet by mouth daily.         . DULoxetine (CYMBALTA) 60 MG capsule   Oral   Take 60 mg by mouth 2 (two) times daily.          Marland Kitchen HYDROcodone-acetaminophen (NORCO/VICODIN) 5-325 MG per tablet   Oral   Take 1 tablet by mouth every 6 (six) hours as needed for pain.         Marland Kitchen QUEtiapine (SEROQUEL) 50 MG tablet   Oral   Take 50 mg by mouth at bedtime as needed (sleep).         . topiramate (TOPAMAX) 50 MG tablet   Oral   Take 50-100 mg by mouth 2 (two) times daily. 1 tablet (50 mg) every morning and  2 tablets (100 mg) at bedtime         . traMADol (ULTRAM) 50 MG tablet   Oral   Take 50 mg by mouth every 6 (six) hours as needed for pain. For pain         . zolpidem (AMBIEN) 10 MG tablet   Oral   Take 10 mg by mouth at bedtime as needed for sleep.         Marland Kitchen HYDROcodone-acetaminophen (NORCO/VICODIN) 5-325 MG per tablet   Oral   Take 1-2 tablets by mouth every 4 (four) hours as needed for pain.   21 tablet   0   . penicillin v potassium (VEETID) 500 MG tablet   Oral   Take 1 tablet (500 mg total) by mouth 4 (four) times daily.   40 tablet   0    BP 121/80  Pulse 90  Temp(Src) 99.5 F (37.5 C) (Oral)  Resp 16  SpO2 97% Physical Exam  Nursing note and vitals reviewed. Constitutional: She appears well-developed and well-nourished.  HENT:  Head: Normocephalic.  Right Ear: Tympanic membrane, external ear and ear canal normal.  Left Ear: Tympanic membrane, external ear and ear canal normal.  Nose: Nose normal. Right sinus exhibits no maxillary sinus tenderness and no frontal sinus tenderness. Left sinus exhibits no maxillary sinus tenderness and no frontal sinus tenderness.  Mouth/Throat: Uvula is midline, oropharynx is clear and moist and mucous membranes are normal. No oral lesions. Dental caries present. No edematous or lacerations. No oropharyngeal exudate, posterior oropharyngeal edema, posterior oropharyngeal erythema or tonsillar abscesses.    Tender to palpation over teeth 29 and 28, but both are intact and non Tooth 30 is missing, but it is well-healed. No gross abscess. No erythema.   Eyes: Conjunctivae are normal. Pupils are equal, round, and reactive to light. Right eye exhibits no discharge. Left eye exhibits no discharge.  Neck: Normal range of motion. Neck supple.  Cardiovascular: Normal rate, regular rhythm and normal heart sounds.   Pulmonary/Chest: Effort normal and breath sounds normal. No respiratory distress. She has no wheezes.  Abdominal: Soft.  Bowel sounds are normal. She exhibits no distension. There is no tenderness.  Lymphadenopathy:       Head (right side): Submandibular and tonsillar adenopathy present. No submental adenopathy present.    She has no cervical adenopathy.  Neurological: She is alert.  Skin: Skin is warm and dry.  Psychiatric: She has a normal mood and affect.    ED Course  Procedures (including critical care time)  DIAGNOSTIC STUDIES: Oxygen Saturation is 97% on room air, normal by my interpretation.    COORDINATION OF CARE:  22:40- Discussed planned course of treatment with the patient, including antibiotics and pain medication, who is agreeable at this time.  Labs Reviewed - No data to display No results found. 1. Pain, dental     MDM  Graciela Husbands presents with dental pain.  Patient with toothache.  No gross abscess.  Exam unconcerning for Ludwig's angina or spread of infection.  Will treat with penicillin and pain medicine.  Urged patient to follow-up with dentist.  I have also discussed reasons to return immediately to the ER.  Patient expresses understanding and agrees with plan.  I personally performed the services described in this documentation, which was scribed in my presence. The recorded information has been reviewed and is accurate.    Dahlia Client Bathsheba Durrett, PA-C 07/07/12 2254  Dierdre Forth, PA-C 07/07/12 [phone]

## 2012-07-07 NOTE — ED Notes (Signed)
Pt c/o pain to Right upper and lower jaw around her teeth x2 weeks

## 2012-07-08 NOTE — ED Provider Notes (Signed)
Medical screening examination/treatment/procedure(s) were performed by non-physician practitioner and as supervising physician I was immediately available for consultation/collaboration.   Carleene Cooper III, MD 07/08/12 1019

## 2012-08-18 ENCOUNTER — Emergency Department (HOSPITAL_COMMUNITY)
Admission: EM | Admit: 2012-08-18 | Discharge: 2012-08-18 | Disposition: A | Discharge status: Home / Self Care | Payer: Medicare PPO | Attending: Emergency Medicine | Admitting: Emergency Medicine

## 2012-08-18 ENCOUNTER — Encounter (HOSPITAL_COMMUNITY): Payer: Self-pay | Admitting: Nurse Practitioner

## 2012-08-18 ENCOUNTER — Emergency Department (HOSPITAL_COMMUNITY): Payer: Medicare PPO

## 2012-08-18 DIAGNOSIS — M1990 Unspecified osteoarthritis, unspecified site: Secondary | ICD-10-CM

## 2012-08-18 DIAGNOSIS — M549 Dorsalgia, unspecified: Secondary | ICD-10-CM | POA: Insufficient documentation

## 2012-08-18 DIAGNOSIS — Z8679 Personal history of other diseases of the circulatory system: Secondary | ICD-10-CM | POA: Insufficient documentation

## 2012-08-18 DIAGNOSIS — Z79899 Other long term (current) drug therapy: Secondary | ICD-10-CM | POA: Insufficient documentation

## 2012-08-18 DIAGNOSIS — M25569 Pain in unspecified knee: Secondary | ICD-10-CM | POA: Insufficient documentation

## 2012-08-18 DIAGNOSIS — F3289 Other specified depressive episodes: Secondary | ICD-10-CM | POA: Insufficient documentation

## 2012-08-18 DIAGNOSIS — I252 Old myocardial infarction: Secondary | ICD-10-CM | POA: Insufficient documentation

## 2012-08-18 DIAGNOSIS — M1710 Unilateral primary osteoarthritis, unspecified knee: Secondary | ICD-10-CM | POA: Insufficient documentation

## 2012-08-18 DIAGNOSIS — I10 Essential (primary) hypertension: Secondary | ICD-10-CM | POA: Insufficient documentation

## 2012-08-18 DIAGNOSIS — M25561 Pain in right knee: Secondary | ICD-10-CM

## 2012-08-18 DIAGNOSIS — F329 Major depressive disorder, single episode, unspecified: Secondary | ICD-10-CM | POA: Insufficient documentation

## 2012-08-18 HISTORY — DX: Migraine, unspecified, not intractable, without status migrainosus: G43.909

## 2012-08-18 MED ORDER — OXYCODONE-ACETAMINOPHEN 5-325 MG PO TABS
1.0000 | ORAL_TABLET | Freq: Once | ORAL | Status: AC
  Administered 2012-08-18: 1 via ORAL
  Filled 2012-08-18: qty 1

## 2012-08-18 MED ORDER — HYDROCODONE-ACETAMINOPHEN 5-325 MG PO TABS: 1.0000 | ORAL_TABLET | Freq: Three times a day (TID) | ORAL | Status: DC | PRN

## 2012-08-18 MED ORDER — NAPROXEN 500 MG PO TABS: 500.0000 mg | ORAL_TABLET | Freq: Two times a day (BID) | ORAL | Status: DC

## 2012-08-18 NOTE — ED Notes (Signed)
C/o R knee pain x 3 weeks, denies any injuries. Ambulatory.

## 2012-08-18 NOTE — ED Provider Notes (Signed)
CSN: [id number]     Arrival date & time 08/18/12  0909 History     First MD Initiated Contact with Patient 08/18/12 [phone]     Chief Complaint  Patient presents with  . Knee Pain   (Consider location/radiation/quality/duration/timing/severity/associated sxs/prior Treatment) The history is provided by the patient. No language interpreter was used.  Autumn Valenzuela is a 52 y/o F with PMHx of CVA, HTN, depression, bipolar, migraine presenting to the ED with right knee pain that has been ongoing for the past 3 week.s patient reported that the pain is localized to the right knee, circumferential described as an "excruciating pain" stabbing sensation that is constant, pain is worse at night - stated that she is unable to sleep at night because the knee is sore. Patient reported that pain is also worse in the morning, reporting stiffness in the AM. Reported mild tingling. Patient denied falling or injury. Patient reported that she had left knee replacement in 2008, reported that the right knee is now acting up on her. Stated that she has been taking Tramadol without much relief. Stated that applying pressure and walking, and propping knee up makes the pain worse. Stated that no motion makes the pain better. Denied fall, injuries, weakness, loss of sensation.  PCP Dr. Colin Benton  Past Medical History  Diagnosis Date  . CVA (cerebral infarction)   . Depression   . Hypertension   . Bipolar disorder   . Migraine    Past Surgical History  Procedure Laterality Date  . Back surgery     History reviewed. No pertinent family history. History  Substance Use Topics  . Smoking status: Never Smoker   . Smokeless tobacco: Not on file  . Alcohol Use: Yes   OB History   Grav Para Term Preterm Abortions TAB SAB Ect Mult Living                 Review of Systems  Constitutional: Negative for fever and chills.  HENT: Negative for neck pain and neck stiffness.   Respiratory: Negative for chest tightness and  shortness of breath.   Cardiovascular: Negative for chest pain.  Musculoskeletal: Positive for back pain (baseline for patient) and arthralgias (right knee).  Neurological: Negative for dizziness, weakness and numbness.  All other systems reviewed and are negative.    Allergies  Cefazolin  Home Medications   Current Outpatient Rx  Name  Route  Sig  Dispense  Refill  . albuterol (PROVENTIL HFA;VENTOLIN HFA) 108 (90 BASE) MCG/ACT inhaler   Inhalation   Inhale 2 puffs into the lungs daily as needed for wheezing or shortness of breath.         Marland Kitchen albuterol (PROVENTIL) (2.5 MG/3ML) 0.083% nebulizer solution   Nebulization   Take 2.5 mg by nebulization at bedtime as needed for wheezing.         Marland Kitchen ALPRAZolam (XANAX) 0.5 MG tablet   Oral   Take 0.5 mg by mouth 2 (two) times daily.          Marland Kitchen amLODipine-valsartan (EXFORGE) 5-320 MG per tablet   Oral   Take 1 tablet by mouth daily.          . Calcium-Vitamin D-Iron (RA CALCIUM/IRON/VITAMIN D PO)   Oral   Take 1 tablet by mouth daily.         . DULoxetine (CYMBALTA) 60 MG capsule   Oral   Take 60 mg by mouth daily.          Marland Kitchen  penicillin v potassium (VEETID) 500 MG tablet   Oral   Take 1 tablet (500 mg total) by mouth 4 (four) times daily.   40 tablet   0   . QUEtiapine (SEROQUEL) 50 MG tablet   Oral   Take 150 mg by mouth at bedtime as needed (sleep).          . topiramate (TOPAMAX) 50 MG tablet   Oral   Take 50-100 mg by mouth 2 (two) times daily. 1 tablet (50 mg) every morning and 2 tablets (100 mg) at bedtime         . traMADol (ULTRAM) 50 MG tablet   Oral   Take 50-100 mg by mouth every 6 (six) hours as needed for pain. For pain         . HYDROcodone-acetaminophen (NORCO) 5-325 MG per tablet   Oral   Take 1 tablet by mouth every 8 (eight) hours as needed for pain.   11 tablet   0   . naproxen (NAPROSYN) 500 MG tablet   Oral   Take 1 tablet (500 mg total) by mouth 2 (two) times daily.   30  tablet   0    BP 120/70  Pulse 86  Temp(Src) 97.8 F (36.6 C) (Oral)  Resp 15  Ht 5\' 1"  (1.549 m)  Wt 230 lb (104.327 kg)  BMI 43.48 kg/m2  SpO2 98% Physical Exam  Nursing note and vitals reviewed. Constitutional: She is oriented to person, place, and time. She appears well-developed and well-nourished. No distress.  HENT:  Head: Normocephalic and atraumatic.  Neck: Normal range of motion. Neck supple.  Cardiovascular: Normal rate, regular rhythm and normal heart sounds.  Exam reveals no friction rub.   No murmur heard. Pulses:      Radial pulses are 2+ on the right side, and 2+ on the left side.       Dorsalis pedis pulses are 2+ on the right side, and 2+ on the left side.  Pulmonary/Chest: Effort normal and breath sounds normal. No respiratory distress. She has no wheezes. She has no rales.  Abdominal:  Obese   Musculoskeletal: She exhibits tenderness.  Negative swelling, erythema, inflammation, warmth to touch to the right knee Negative deformity noted  Pain upon palpation to the right knee circumferential  Decreased ROM to the right knee secondary to pain    Neurological: She is alert and oriented to person, place, and time. She exhibits normal muscle tone. Coordination normal.  Strength 5+/5+ with resistance Sensation intact to lower extremities with differentiation to sharp and dull touch Gait unsteady secondary to pain when patient placed weight on right knee   Skin: Skin is warm and dry. No rash noted. She is not diaphoretic. No erythema.  Psychiatric: She has a normal mood and affect. Her behavior is normal. Thought content normal.    ED Course   Procedures (including critical care time)  Medications  oxyCODONE-acetaminophen (PERCOCET/ROXICET) 5-325 MG per tablet 1 tablet (1 tablet Oral Given 08/18/12 0944)    Labs Reviewed - No data to display Dg Knee Complete 4 Views Right  08/18/2012   *RADIOLOGY REPORT*  Clinical Data: Right knee pain for 2 days.  No  known injury.  RIGHT KNEE - COMPLETE 4+ VIEW  Comparison: 08/29/2008.  Findings: No significant joint space narrowing.  No fracture or dislocation.  IMPRESSION: No acute bony abnormality   Original Report Authenticated By: Lacy Duverney, M.D.   1. Knee pain, right   2. Arthritis  MDM  Patient presenting to the ED with right knee pain x 3 weeks. Patient has prior history of knee discomfort with TKR to the left knee in 2008.  Mild swelling to the right knee - negative erythema, inflammation, warmth - doubt septic joint. Pain is circumferential. Decreased ROM and ability to apply pressure to the right leg secondary to pain. Sensation intact. Strength intact. Negative neruological deficits noted.  Imaging negative acute injuries noted. Suspicion to be beginnings of arthritis in the joint. Patient placed in knee sleeve and crutches given for comfort. Patient stable, afebrile. Pain controlled in ED setting. Discharged patient. Referred to orthopedics for further imaging that may be required, possible PT. Discussed with patient to rest and stay hydrated. Discussed with patient to rest, ice, elevate the leg. Discussed with patient to avoid strenuous activity. Discussed with patient to monitor symptoms closely and if symptoms are to worsen or change to report back to the ED - strict return instructions given.  Patient agreed to plan of care, understood, all questions answered.   Raymon Mutton, PA-C 08/18/12 [phone]

## 2012-08-18 NOTE — ED Notes (Signed)
Patient transported to X-ray 

## 2012-08-19 NOTE — ED Provider Notes (Signed)
Medical screening examination/treatment/procedure(s) were performed by non-physician practitioner and as supervising physician I was immediately available for consultation/collaboration.   Enid Skeens, MD 08/19/12 2258

## 2012-08-26 ENCOUNTER — Telehealth: Payer: Self-pay | Admitting: Neurology

## 2012-09-03 ENCOUNTER — Ambulatory Visit: Payer: Self-pay | Admitting: Neurology

## 2012-10-07 ENCOUNTER — Other Ambulatory Visit: Payer: Self-pay | Admitting: Internal Medicine

## 2012-10-07 DIAGNOSIS — N6310 Unspecified lump in the right breast, unspecified quadrant: Secondary | ICD-10-CM

## 2012-10-13 ENCOUNTER — Emergency Department (HOSPITAL_COMMUNITY)
Admission: EM | Admit: 2012-10-13 | Discharge: 2012-10-13 | Disposition: A | Discharge status: Home / Self Care | Payer: PRIVATE HEALTH INSURANCE | Attending: Emergency Medicine | Admitting: Emergency Medicine

## 2012-10-13 ENCOUNTER — Encounter (HOSPITAL_COMMUNITY): Payer: Self-pay | Admitting: Emergency Medicine

## 2012-10-13 ENCOUNTER — Emergency Department (HOSPITAL_COMMUNITY): Payer: PRIVATE HEALTH INSURANCE

## 2012-10-13 DIAGNOSIS — I10 Essential (primary) hypertension: Secondary | ICD-10-CM | POA: Insufficient documentation

## 2012-10-13 DIAGNOSIS — S6990XA Unspecified injury of unspecified wrist, hand and finger(s), initial encounter: Secondary | ICD-10-CM | POA: Insufficient documentation

## 2012-10-13 DIAGNOSIS — IMO0002 Reserved for concepts with insufficient information to code with codable children: Secondary | ICD-10-CM | POA: Diagnosis not present

## 2012-10-13 DIAGNOSIS — Y9289 Other specified places as the place of occurrence of the external cause: Secondary | ICD-10-CM | POA: Diagnosis not present

## 2012-10-13 DIAGNOSIS — F319 Bipolar disorder, unspecified: Secondary | ICD-10-CM | POA: Insufficient documentation

## 2012-10-13 DIAGNOSIS — G43909 Migraine, unspecified, not intractable, without status migrainosus: Secondary | ICD-10-CM | POA: Diagnosis not present

## 2012-10-13 DIAGNOSIS — S59909A Unspecified injury of unspecified elbow, initial encounter: Secondary | ICD-10-CM | POA: Diagnosis not present

## 2012-10-13 DIAGNOSIS — S4980XA Other specified injuries of shoulder and upper arm, unspecified arm, initial encounter: Secondary | ICD-10-CM | POA: Diagnosis present

## 2012-10-13 DIAGNOSIS — S46909A Unspecified injury of unspecified muscle, fascia and tendon at shoulder and upper arm level, unspecified arm, initial encounter: Secondary | ICD-10-CM | POA: Insufficient documentation

## 2012-10-13 DIAGNOSIS — Y9389 Activity, other specified: Secondary | ICD-10-CM | POA: Diagnosis not present

## 2012-10-13 DIAGNOSIS — Z79899 Other long term (current) drug therapy: Secondary | ICD-10-CM | POA: Insufficient documentation

## 2012-10-13 DIAGNOSIS — S4992XA Unspecified injury of left shoulder and upper arm, initial encounter: Secondary | ICD-10-CM

## 2012-10-13 MED ORDER — HYDROCODONE-ACETAMINOPHEN 5-325 MG PO TABS: 1.0000 | ORAL_TABLET | Freq: Four times a day (QID) | ORAL | Status: DC | PRN

## 2012-10-13 MED ORDER — NAPROXEN 500 MG PO TABS
ORAL_TABLET | ORAL | Status: AC
  Filled 2012-10-13: qty 1

## 2012-10-13 MED ORDER — NAPROXEN 500 MG PO TABS
500.0000 mg | ORAL_TABLET | Freq: Once | ORAL | Status: AC
  Administered 2012-10-13: 500 mg via ORAL
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
## 2012-10-15 ENCOUNTER — Encounter: Payer: Self-pay | Admitting: Nurse Practitioner

## 2012-10-15 ENCOUNTER — Ambulatory Visit: Payer: Self-pay | Admitting: Neurology

## 2012-10-19 ENCOUNTER — Ambulatory Visit
Admission: RE | Admit: 2012-10-19 | Discharge: 2012-10-19 | Disposition: A | Discharge status: Home / Self Care | Payer: PRIVATE HEALTH INSURANCE | Source: Ambulatory Visit | Attending: Internal Medicine | Admitting: Internal Medicine

## 2012-10-19 ENCOUNTER — Other Ambulatory Visit: Payer: Self-pay | Admitting: Internal Medicine

## 2012-10-19 DIAGNOSIS — N6310 Unspecified lump in the right breast, unspecified quadrant: Secondary | ICD-10-CM

## 2012-10-29 ENCOUNTER — Telehealth: Payer: Self-pay | Admitting: *Deleted

## 2012-10-29 NOTE — Telephone Encounter (Signed)
Called patient to make a RV appointment, no one answered waiting on return call.Marland Kitchen

## 2012-11-02 ENCOUNTER — Telehealth: Payer: Self-pay | Admitting: Neurology

## 2012-11-04 NOTE — Telephone Encounter (Signed)
Called patient left message to call me back so I could schedule a appointment with the .

## 2012-11-12 ENCOUNTER — Ambulatory Visit: Payer: Medicare HMO | Attending: Internal Medicine

## 2012-11-12 DIAGNOSIS — M6281 Muscle weakness (generalized): Secondary | ICD-10-CM | POA: Insufficient documentation

## 2012-11-12 DIAGNOSIS — IMO0001 Reserved for inherently not codable concepts without codable children: Secondary | ICD-10-CM | POA: Insufficient documentation

## 2012-11-12 DIAGNOSIS — M25569 Pain in unspecified knee: Secondary | ICD-10-CM | POA: Insufficient documentation

## 2012-11-12 DIAGNOSIS — Z96659 Presence of unspecified artificial knee joint: Secondary | ICD-10-CM | POA: Insufficient documentation

## 2012-11-12 DIAGNOSIS — R262 Difficulty in walking, not elsewhere classified: Secondary | ICD-10-CM | POA: Insufficient documentation

## 2012-11-16 ENCOUNTER — Encounter: Payer: Medicare HMO | Admitting: Rehabilitation

## 2012-11-23 ENCOUNTER — Ambulatory Visit (INDEPENDENT_AMBULATORY_CARE_PROVIDER_SITE_OTHER): Payer: 59 | Admitting: Neurology

## 2012-11-23 ENCOUNTER — Encounter: Payer: Self-pay | Admitting: Neurology

## 2012-11-23 ENCOUNTER — Encounter: Payer: Medicare HMO | Admitting: Rehabilitation

## 2012-11-23 VITALS — BP 93/71 | HR 75 | Temp 98.4°F | Ht 62.0 in | Wt 228.0 lb

## 2012-11-23 DIAGNOSIS — I6789 Other cerebrovascular disease: Secondary | ICD-10-CM | POA: Insufficient documentation

## 2012-11-23 DIAGNOSIS — F3289 Other specified depressive episodes: Secondary | ICD-10-CM

## 2012-11-23 DIAGNOSIS — F329 Major depressive disorder, single episode, unspecified: Secondary | ICD-10-CM

## 2012-11-23 DIAGNOSIS — F32A Depression, unspecified: Secondary | ICD-10-CM | POA: Insufficient documentation

## 2012-11-23 DIAGNOSIS — R519 Headache, unspecified: Secondary | ICD-10-CM | POA: Insufficient documentation

## 2012-11-23 DIAGNOSIS — R51 Headache: Secondary | ICD-10-CM

## 2012-11-23 MED ORDER — AMITRIPTYLINE HCL 25 MG PO TABS: 25.0000 mg | ORAL_TABLET | Freq: Every day | ORAL | Status: DC

## 2012-11-23 MED ORDER — TOPIRAMATE 50 MG PO TABS: 50.0000 mg | ORAL_TABLET | Freq: Two times a day (BID) | ORAL | Status: DC

## 2012-11-23 NOTE — Progress Notes (Signed)
[facility] [address] [place]. [place] [address] [phone]       OFFICE FOLLOW-UP NOTE  Autumn Valenzuela Date of Birth:  [birth year] Medical Record Number:  [id number]   HPI: 52 year african american lady  With long standing migraine headachess which have increased in frequency and severity over the years since her stroke in 2007 to now daily headaches. These are mostly at night , severe , throbbing along with muscle pulling and tightness at back of neck. She iwas last ssen by me in 2012 and was doing well on Topamax 50 mg but is now on 50 mg am and 100 mg hs and complains of memory loss, short term memory difficulties and takes tramadol as well as norco/vicodin several tablets daily mainly for her chronic backache.she has difficulty with sleeping at night even with sleeping aids and feels tired. She had a sleep study years ago and did not have sleep apnoea but was diagnosed with restless legs but denies disabling symptoms at present time.She also has depression and states she is doingw ell on cymbalta and also sees a Haematologist.She has difficullty with reading and has blurred vision and plans to see a eye doctor soon.  ROS:   14 system review of systems is positive for chills,blurred vision,doublevision,weakness,headache,insomnia,sleepiness,restless legs, not enough sleep,flushing  PMH:  Past Medical History  Diagnosis Date  . CVA (cerebral infarction)   . Depression   . Hypertension   . Bipolar disorder   . Migraine   . High cholesterol     Social History:  History   Social History  . Marital Status: Single    Spouse Name: N/A    Number of Children: 3  . Years of Education: 12   Occupational History  . Not on file.   Social History Main Topics  . Smoking status: Never Smoker   . Smokeless tobacco: Not on file  . Alcohol Use: No  . Drug Use: Yes    Special: Marijuana  . Sexual Activity: Yes   Other Topics Concern  . Not on file   Social  History Narrative  . No narrative on file    Medications:   Current Outpatient Prescriptions on File Prior to Visit  Medication Sig Dispense Refill  . albuterol (PROVENTIL HFA;VENTOLIN HFA) 108 (90 BASE) MCG/ACT inhaler Inhale 2 puffs into the lungs daily as needed for wheezing or shortness of breath.      Marland Kitchen albuterol (PROVENTIL) (2.5 MG/3ML) 0.083% nebulizer solution Take 2.5 mg by nebulization at bedtime as needed for wheezing.      Marland Kitchen ALPRAZolam (XANAX) 0.5 MG tablet Take 0.5 mg by mouth 2 (two) times daily as needed for anxiety.      Marland Kitchen amLODipine-valsartan (EXFORGE) 5-320 MG per tablet Take 1 tablet by mouth daily.       . calcium-vitamin D (OSCAL WITH D) 500-200 MG-UNIT per tablet Take 1 tablet by mouth daily with breakfast.      . DULoxetine (CYMBALTA) 60 MG capsule Take 60 mg by mouth daily.       Marland Kitchen HYDROcodone-acetaminophen (NORCO) 5-325 MG per tablet Take 1 tablet by mouth every 8 (eight) hours as needed for pain.  11 tablet  0  . HYDROcodone-acetaminophen (NORCO) 5-325 MG per tablet Take 1-2 tablets by mouth every 6 (six) hours as needed for pain.  16 tablet  0  . QUEtiapine (SEROQUEL) 50 MG tablet Take 150 mg by mouth at bedtime as needed (sleep).       Marland Kitchen  traMADol (ULTRAM) 50 MG tablet Take 50-100 mg by mouth every 6 (six) hours as needed for pain.        No current facility-administered medications on file prior to visit.    Allergies:   Allergies  Allergen Reactions  . Cefazolin Other (See Comments)    Rapid iv infusion caused swelling and burning at the iv site    Physical Exam General: well developed, well nourished, seated, in no evident distress Head: head normocephalic and atraumatic. Orohparynx benign Neck: supple with no carotid or supraclavicular bruits Cardiovascular: regular rate and rhythm, no murmurs Musculoskeletal: no deformity Skin:  no rash/petichiae Vascular:  Normal pulses all extremities Filed Vitals:   11/23/12 1556  BP: 93/71  Pulse: 75  Temp:  98.4 F (36.9 C)    Neurologic Exam Mental Status: Awake and fully alert. Oriented to place and time. Recent and remote memory intact. Attention span, concentration and fund of knowledge appropriate. Mood and affect appropriate.  Cranial Nerves: Fundoscopic exam reveals sharp disc margins. Pupils equal, briskly reactive to light. Extraocular movements full without nystagmus. Visual fields full to confrontation. Hearing intact. Facial sensation intact. Face, tongue, palate moves normally and symmetrically.  Motor: Normal bulk and tone. Normal strength in all tested extremity muscles. Sensory.: intact to touch and pinprick and vibratory.  Coordination: Rapid alternating movements normal in all extremities. Finger-to-nose and heel-to-shin performed accurately bilaterally. Gait and Station: Arises from chair without difficulty. Stance is broad based. Gait demonstrates normal stride length and  Mild imbalance uses a wheeled walker Reflexes: 1+ and symmetric. Toes downgoing.       ASSESSMENT:  52 year old African [place] lady with chronic daily headaches likely mixed migraine and tension heaadaches with analgesic rebound. Remote h/o embolic stroke in 2007   PLAN:  She was advised to decrease the Topamax to 50 mg twice daily and start Elavil 25 mg at night for 2 weeks to increase if tolerated to 50 mg after 2 weeks.She was advised to limit use of tramadol and narcotics for pain due to analgesic rebound..She was advised to do regular neck stretching exercises. Continue aspirin for stroke prevention. Return for followup in 2 months with Heide Guile, NP or  call earlier if necessary

## 2012-11-23 NOTE — Patient Instructions (Addendum)
She was advised to decrease the Topamax to 50 mg twice daily and start Elavil 25 mg at night for 2 weeks to increase if tolerated to 50 mg after 2 weeks.She was advised to limit use of tramadol and narcotics for pain due to analgesic rebound..She was advised to do regular neck stretching exercises. Continue aspirin for stroke prevention. Return for followup in 2 months with Heide Guile, NP or  call earlier if necessary

## 2012-11-24 ENCOUNTER — Ambulatory Visit: Payer: Medicare HMO | Attending: Internal Medicine | Admitting: Rehabilitation

## 2012-11-24 DIAGNOSIS — Z96659 Presence of unspecified artificial knee joint: Secondary | ICD-10-CM | POA: Insufficient documentation

## 2012-11-24 DIAGNOSIS — M6281 Muscle weakness (generalized): Secondary | ICD-10-CM | POA: Insufficient documentation

## 2012-11-24 DIAGNOSIS — IMO0001 Reserved for inherently not codable concepts without codable children: Secondary | ICD-10-CM | POA: Insufficient documentation

## 2012-11-24 DIAGNOSIS — R262 Difficulty in walking, not elsewhere classified: Secondary | ICD-10-CM | POA: Insufficient documentation

## 2012-11-24 DIAGNOSIS — M25569 Pain in unspecified knee: Secondary | ICD-10-CM | POA: Insufficient documentation

## 2012-11-25 ENCOUNTER — Encounter: Payer: Medicare HMO | Admitting: Rehabilitation

## 2012-11-26 ENCOUNTER — Ambulatory Visit: Payer: Medicare HMO | Admitting: Rehabilitation

## 2012-12-07 ENCOUNTER — Ambulatory Visit: Payer: Medicare HMO

## 2013-01-04 ENCOUNTER — Other Ambulatory Visit: Payer: Self-pay

## 2013-01-04 MED ORDER — TOPIRAMATE 50 MG PO TABS: ORAL_TABLET | ORAL | Status: DC

## 2013-01-05 ENCOUNTER — Ambulatory Visit: Payer: Medicare HMO | Attending: Internal Medicine

## 2013-01-05 DIAGNOSIS — IMO0001 Reserved for inherently not codable concepts without codable children: Secondary | ICD-10-CM | POA: Insufficient documentation

## 2013-01-05 DIAGNOSIS — M25569 Pain in unspecified knee: Secondary | ICD-10-CM | POA: Insufficient documentation

## 2013-01-05 DIAGNOSIS — Z96659 Presence of unspecified artificial knee joint: Secondary | ICD-10-CM | POA: Insufficient documentation

## 2013-01-05 DIAGNOSIS — R262 Difficulty in walking, not elsewhere classified: Secondary | ICD-10-CM | POA: Insufficient documentation

## 2013-01-05 DIAGNOSIS — M6281 Muscle weakness (generalized): Secondary | ICD-10-CM | POA: Insufficient documentation

## 2013-01-11 ENCOUNTER — Encounter: Payer: Medicare HMO | Admitting: Physical Therapy

## 2013-01-13 ENCOUNTER — Ambulatory Visit: Payer: Medicare HMO | Admitting: Physical Therapy

## 2013-01-18 ENCOUNTER — Encounter: Payer: Medicare HMO | Admitting: Physical Therapy

## 2013-01-20 ENCOUNTER — Ambulatory Visit: Payer: Medicare HMO | Attending: Internal Medicine | Admitting: Physical Therapy

## 2013-01-20 DIAGNOSIS — M6281 Muscle weakness (generalized): Secondary | ICD-10-CM | POA: Insufficient documentation

## 2013-01-20 DIAGNOSIS — IMO0001 Reserved for inherently not codable concepts without codable children: Secondary | ICD-10-CM | POA: Insufficient documentation

## 2013-01-20 DIAGNOSIS — Z96659 Presence of unspecified artificial knee joint: Secondary | ICD-10-CM | POA: Insufficient documentation

## 2013-01-20 DIAGNOSIS — M25569 Pain in unspecified knee: Secondary | ICD-10-CM | POA: Insufficient documentation

## 2013-01-20 DIAGNOSIS — R262 Difficulty in walking, not elsewhere classified: Secondary | ICD-10-CM | POA: Insufficient documentation

## 2013-02-10 ENCOUNTER — Other Ambulatory Visit: Payer: Self-pay

## 2013-02-10 DIAGNOSIS — R51 Headache: Principal | ICD-10-CM

## 2013-02-10 DIAGNOSIS — R519 Headache, unspecified: Secondary | ICD-10-CM

## 2013-02-10 MED ORDER — AMITRIPTYLINE HCL 25 MG PO TABS: 50.0000 mg | ORAL_TABLET | Freq: Every day | ORAL | Status: DC

## 2013-03-03 ENCOUNTER — Ambulatory Visit: Payer: 59 | Admitting: Nurse Practitioner

## 2013-03-10 ENCOUNTER — Other Ambulatory Visit: Payer: Self-pay

## 2013-03-10 MED ORDER — TOPIRAMATE 50 MG PO TABS: 50.0000 mg | ORAL_TABLET | Freq: Two times a day (BID) | ORAL | Status: DC

## 2013-03-10 NOTE — Telephone Encounter (Signed)
Last OV says:  PLAN:  She was advised to decrease the Topamax to 50 mg twice daily

## 2013-03-13 ENCOUNTER — Encounter (HOSPITAL_COMMUNITY): Payer: Self-pay | Admitting: Emergency Medicine

## 2013-03-13 ENCOUNTER — Emergency Department (HOSPITAL_COMMUNITY)
Admission: EM | Admit: 2013-03-13 | Discharge: 2013-03-13 | Disposition: A | Discharge status: Home / Self Care | Payer: Medicare HMO | Attending: Emergency Medicine | Admitting: Emergency Medicine

## 2013-03-13 DIAGNOSIS — G43909 Migraine, unspecified, not intractable, without status migrainosus: Secondary | ICD-10-CM | POA: Insufficient documentation

## 2013-03-13 DIAGNOSIS — Z8673 Personal history of transient ischemic attack (TIA), and cerebral infarction without residual deficits: Secondary | ICD-10-CM | POA: Insufficient documentation

## 2013-03-13 DIAGNOSIS — F319 Bipolar disorder, unspecified: Secondary | ICD-10-CM | POA: Insufficient documentation

## 2013-03-13 DIAGNOSIS — E669 Obesity, unspecified: Secondary | ICD-10-CM | POA: Insufficient documentation

## 2013-03-13 DIAGNOSIS — Z90710 Acquired absence of both cervix and uterus: Secondary | ICD-10-CM | POA: Insufficient documentation

## 2013-03-13 DIAGNOSIS — R630 Anorexia: Secondary | ICD-10-CM | POA: Insufficient documentation

## 2013-03-13 DIAGNOSIS — Z8639 Personal history of other endocrine, nutritional and metabolic disease: Secondary | ICD-10-CM | POA: Insufficient documentation

## 2013-03-13 DIAGNOSIS — I10 Essential (primary) hypertension: Secondary | ICD-10-CM | POA: Insufficient documentation

## 2013-03-13 DIAGNOSIS — J029 Acute pharyngitis, unspecified: Secondary | ICD-10-CM | POA: Insufficient documentation

## 2013-03-13 DIAGNOSIS — R109 Unspecified abdominal pain: Secondary | ICD-10-CM | POA: Insufficient documentation

## 2013-03-13 DIAGNOSIS — R319 Hematuria, unspecified: Secondary | ICD-10-CM

## 2013-03-13 DIAGNOSIS — Z862 Personal history of diseases of the blood and blood-forming organs and certain disorders involving the immune mechanism: Secondary | ICD-10-CM | POA: Insufficient documentation

## 2013-03-13 DIAGNOSIS — Z79899 Other long term (current) drug therapy: Secondary | ICD-10-CM | POA: Insufficient documentation

## 2013-03-13 LAB — BASIC METABOLIC PANEL
BUN: 10 mg/dL (ref 6–23)
CHLORIDE: 103 meq/L (ref 96–112)
CO2: 23 mEq/L (ref 19–32)
Calcium: 9 mg/dL (ref 8.4–10.5)
Creatinine, Ser: 0.62 mg/dL (ref 0.50–1.10)
GFR calc non Af Amer: >90 mL/min (ref >90)
GLUCOSE: 117 mg/dL — AB (ref 70–99)
POTASSIUM: 4.6 meq/L (ref 3.7–5.3)
SODIUM: 142 meq/L (ref 137–147)

## 2013-03-13 LAB — CBC WITH DIFFERENTIAL/PLATELET
BASOS ABS: 0 10*3/uL (ref 0.0–0.1)
Basophils Relative: 0 % (ref 0–1)
EOS PCT: 5 % (ref 0–5)
Eosinophils Absolute: 0.5 10*3/uL (ref 0.0–0.7)
HCT: 39.1 % (ref 36.0–46.0)
HEMOGLOBIN: 12.9 g/dL (ref 12.0–15.0)
LYMPHS ABS: 3.8 10*3/uL (ref 0.7–4.0)
LYMPHS PCT: 40 % (ref 12–46)
MCH: 28.4 pg (ref 26.0–34.0)
MCHC: 33 g/dL (ref 30.0–36.0)
MCV: 86.1 fL (ref 78.0–100.0)
MONOS PCT: 9 % (ref 3–12)
Monocytes Absolute: 0.8 10*3/uL (ref 0.1–1.0)
NEUTROS ABS: 4.3 10*3/uL (ref 1.7–7.7)
Neutrophils Relative %: 46 % (ref 43–77)
Platelets: 213 10*3/uL (ref 150–400)
RBC: 4.54 MIL/uL (ref 3.87–5.11)
RDW: 15.2 % (ref 11.5–15.5)
WBC: 9.4 10*3/uL (ref 4.0–10.5)

## 2013-03-13 LAB — URINALYSIS, ROUTINE W REFLEX MICROSCOPIC
Bilirubin Urine: NEGATIVE
Glucose, UA: NEGATIVE mg/dL
HGB URINE DIPSTICK: NEGATIVE
Ketones, ur: NEGATIVE mg/dL
Leukocytes, UA: NEGATIVE
Nitrite: NEGATIVE
PH: 5.5 (ref 5.0–8.0)
Protein, ur: NEGATIVE mg/dL
SPECIFIC GRAVITY, URINE: 1.024 (ref 1.005–1.030)
UROBILINOGEN UA: 0.2 mg/dL (ref 0.0–1.0)

## 2013-03-13 MED ORDER — SODIUM CHLORIDE 0.9 % IV SOLN
1000.0000 mL | INTRAVENOUS | Status: DC
  Administered 2013-03-13: 1000 mL via INTRAVENOUS

## 2013-03-13 MED ORDER — SODIUM CHLORIDE 0.9 % IV SOLN
1000.0000 mL | Freq: Once | INTRAVENOUS | Status: AC
  Administered 2013-03-13: 1000 mL via INTRAVENOUS

## 2013-03-13 MED ORDER — HYDROMORPHONE HCL PF 1 MG/ML IJ SOLN
0.5000 mg | INTRAMUSCULAR | Status: DC | PRN
  Administered 2013-03-13: 0.5 mg via INTRAVENOUS
  Filled 2013-03-13: qty 1

## 2013-03-13 MED ORDER — ONDANSETRON HCL 4 MG/2ML IJ SOLN
4.0000 mg | Freq: Once | INTRAMUSCULAR | Status: AC
  Administered 2013-03-13: 4 mg via INTRAVENOUS
  Filled 2013-03-13: qty 2

## 2013-03-13 NOTE — ED Notes (Signed)
Pt sitting up in bed, appears comfortable, knitting.

## 2013-03-13 NOTE — ED Provider Notes (Signed)
CSN: [id number]     Arrival date & time 03/13/13  1839 History   First MD Initiated Contact with Patient 03/13/13 1905     Chief Complaint  Patient presents with  . Hematuria    HPI Pt had an episode of hematuria today.  She had some general malaise today but no pain with urination.  This evening she noticed bright red blood in the urine. She has had some discomfort in her lower abdomen.  She denies vaginal bleeding.  She has had some lower back pain.  No fevers.  No vomiting or diarrhea. Past Medical History  Diagnosis Date  . CVA (cerebral infarction)   . Depression   . Hypertension   . Bipolar disorder   . Migraine   . High cholesterol    Past Surgical History  Procedure Laterality Date  . Back surgery    . Partial hysterectomy     History reviewed. No pertinent family history. History  Substance Use Topics  . Smoking status: Never Smoker   . Smokeless tobacco: Not on file  . Alcohol Use: No   OB History   Grav Para Term Preterm Abortions TAB SAB Ect Mult Living                 Review of Systems  HENT:       Mild sore throat   Gastrointestinal:       Poor appetite   All other systems reviewed and are negative.      Allergies  Cefazolin  Home Medications   Current Outpatient Rx  Name  Route  Sig  Dispense  Refill  . albuterol (PROVENTIL HFA;VENTOLIN HFA) 108 (90 BASE) MCG/ACT inhaler   Inhalation   Inhale 2 puffs into the lungs daily as needed for wheezing or shortness of breath.         Marland Kitchen albuterol (PROVENTIL) (2.5 MG/3ML) 0.083% nebulizer solution   Nebulization   Take 2.5 mg by nebulization at bedtime as needed for wheezing.         Marland Kitchen ALPRAZolam (XANAX) 0.5 MG tablet   Oral   Take 0.5 mg by mouth 2 (two) times daily as needed for anxiety.         Marland Kitchen amitriptyline (ELAVIL) 25 MG tablet   Oral   Take 2 tablets (50 mg total) by mouth at bedtime.   60 tablet   3   . amLODipine-valsartan (EXFORGE) 5-320 MG per tablet   Oral   Take 1  tablet by mouth daily.          . calcium-vitamin D (OSCAL WITH D) 500-200 MG-UNIT per tablet   Oral   Take 1 tablet by mouth daily with breakfast.         . DULoxetine (CYMBALTA) 60 MG capsule   Oral   Take 60 mg by mouth daily.          Marland Kitchen HYDROcodone-acetaminophen (NORCO) 5-325 MG per tablet   Oral   Take 1 tablet by mouth every 8 (eight) hours as needed for pain.   11 tablet   0   . QUEtiapine (SEROQUEL) 50 MG tablet   Oral   Take 50 mg by mouth at bedtime.         . topiramate (TOPAMAX) 50 MG tablet   Oral   Take 1 tablet (50 mg total) by mouth 2 (two) times daily.   60 tablet   6    BP 112/71  Pulse 82  Temp(Src) 98.1 F (  36.7 C) (Oral)  Resp 18  SpO2 100% Physical Exam  Nursing note and vitals reviewed. Constitutional: She appears well-developed and well-nourished. No distress.  Obese   HENT:  Head: Normocephalic and atraumatic.  Right Ear: External ear normal.  Left Ear: External ear normal.  Eyes: Conjunctivae are normal. Right eye exhibits no discharge. Left eye exhibits no discharge. No scleral icterus.  Neck: Neck supple. No tracheal deviation present.  Cardiovascular: Normal rate, regular rhythm and intact distal pulses.   Pulmonary/Chest: Effort normal and breath sounds normal. No stridor. No respiratory distress. She has no wheezes. She has no rales.  Abdominal: Soft. Bowel sounds are normal. She exhibits no distension. There is tenderness. There is no rebound and no guarding.  Mild suprapubic  Musculoskeletal: She exhibits no edema and no tenderness.  Neurological: She is alert. She has normal strength. No cranial nerve deficit (no facial droop, extraocular movements intact, no slurred speech) or sensory deficit. She exhibits normal muscle tone. She displays no seizure activity. Coordination normal.  Skin: Skin is warm and dry. No rash noted.  Psychiatric: She has a normal mood and affect.    ED Course  Procedures (including critical care  time) Labs Review Labs Reviewed  URINALYSIS, ROUTINE W REFLEX MICROSCOPIC - Abnormal; Notable for the following:    APPearance HAZY (*)    All other components within normal limits  BASIC METABOLIC PANEL - Abnormal; Notable for the following:    Glucose, Bld 117 (*)    All other components within normal limits  URINE CULTURE  CBC WITH DIFFERENTIAL    MDM   Final diagnoses:  Hematuria    Pt's urine is unremarkable today.  CBC and BMET are normal.  At this time there does not appear to be any evidence of an acute emergency medical condition and the patient appears stable for discharge with appropriate outpatient follow up.  Rec follow up with PCP    Kathalene Frames, MD 03/13/13 2154

## 2013-03-13 NOTE — Discharge Instructions (Signed)
Hematuria, Adult °Hematuria is blood in your urine. It can be caused by a bladder infection, kidney infection, prostate infection, kidney stone, or cancer of your urinary tract. Infections can usually be treated with medicine, and a kidney stone usually will pass through your urine. If neither of these is the cause of your hematuria, further workup to find out the reason may be needed. °It is very important that you tell your health care provider about any blood you see in your urine, even if the blood stops without treatment or happens without causing pain. Blood in your urine that happens and then stops and then happens again can be a symptom of a very serious condition. Also, pain is not a symptom in the initial stages of many urinary cancers. °HOME CARE INSTRUCTIONS  °· Drink lots of fluid, 3 4 quarts a day. If you have been diagnosed with an infection, cranberry juice is especially recommended, in addition to large amounts of water. °· Avoid caffeine, tea, and carbonated beverages, because they tend to irritate the bladder. °· Avoid alcohol because it may irritate the prostate. °· Only take over-the-counter or prescription medicines for pain, discomfort, or fever as directed by your health care provider. °· If you have been diagnosed with a kidney stone, follow your health care provider's instructions regarding straining your urine to catch the stone. °· Empty your bladder often. Avoid holding urine for long periods of time. °· After a bowel movement, women should cleanse front to back. Use each tissue only once. °· Empty your bladder before and after sexual intercourse if you are a female. °SEEK MEDICAL CARE IF: °You develop back pain, fever, a feeling of sickness in your stomach (nausea), or vomiting or if your symptoms are not better in 3 days. Return sooner if you are getting worse. °SEEK IMMEDIATE MEDICAL CARE IF:  °· You have a persistent fever, with a temperature of 101.8°F (38.8°C) or greater. °· You  develop severe vomiting and are unable to keep the medicine down. °· You develop severe back or abdominal pain despite taking your medicines. °· You begin passing a large amount of blood or clots in your urine. °· You feel extremely weak or faint, or you pass out. °MAKE SURE YOU:  °· Understand these instructions. °· Will watch your condition. °· Will get help right away if you are not doing well or get worse. °Document Released: 12/31/2004 Document Revised: 10/21/2012 Document Reviewed: 08/31/2012 °[employer]® Patient Information ©2014 [employer]. ° °

## 2013-03-13 NOTE — ED Notes (Signed)
Reports having cold symptoms today, then went to restroom this evening and had large amounts of blood in urine. Mild lower abd discomfort but no pain when urinating.

## 2013-03-15 LAB — URINE CULTURE

## 2013-04-20 NOTE — Telephone Encounter (Signed)
Closing encounter

## 2013-06-22 ENCOUNTER — Ambulatory Visit: Payer: 59 | Admitting: Nurse Practitioner

## 2013-08-31 ENCOUNTER — Ambulatory Visit: Payer: Medicare PPO | Admitting: Nurse Practitioner

## 2013-09-03 ENCOUNTER — Ambulatory Visit: Payer: Medicare PPO | Admitting: Nurse Practitioner

## 2013-09-14 ENCOUNTER — Ambulatory Visit (INDEPENDENT_AMBULATORY_CARE_PROVIDER_SITE_OTHER): Payer: Medicare PPO | Admitting: Adult Health

## 2013-09-14 ENCOUNTER — Encounter: Payer: Self-pay | Admitting: Adult Health

## 2013-09-14 VITALS — BP 106/82 | HR 85 | Ht 61.75 in | Wt 232.0 lb

## 2013-09-14 DIAGNOSIS — R51 Headache: Secondary | ICD-10-CM

## 2013-09-14 DIAGNOSIS — R519 Headache, unspecified: Secondary | ICD-10-CM

## 2013-09-14 DIAGNOSIS — I6789 Other cerebrovascular disease: Secondary | ICD-10-CM

## 2013-09-14 NOTE — Patient Instructions (Signed)

## 2013-09-14 NOTE — Progress Notes (Signed)
I agree with the above plan 

## 2013-09-14 NOTE — Progress Notes (Signed)
PATIENT: Autumn Valenzuela DOB: Apr 03, 1960  REASON FOR VISIT: follow up HISTORY FROM: patient  HISTORY OF PRESENT ILLNESS: Autumn Valenzuela is a 53 year old female with a history of migraines. She returns today for follow-up. She is currently taking Topamax 50 mg BID and Elavil 50 mg daily. She reports that her headaches have improved. When she has a severe headache she will take an additional Topamax and that seems to help her headaches. In the past she could not tolerate an increase in Topamax due to memory loss. Although now she states that she believes that was due to her "doing too much" and not "resting." She takes Elavil but only takes it every other night, for no reason. She states that she wakes up with a headache and goes to bed with a headache. She had a sleep study >5 years ago. Then she was diagnosed with RLS but not sleep apnea. She does report that she snores and reports that her daughter has witnessed her having apnea events. She states that around 10:00am she begins to get sleepy and that last throughout the day. She rarely has days that she does not have a headache. She states that her headaches are bifrontal and states that most of the time it is a dull ache. She states that she may take hydrocodone when the headache is severe but she does not take it often. She does take Tramadol when she is having a "bad headache" and reports that it really helps.   HISTORY 11/23/12 Autumn Valenzuela): 19 year african [place] lady With long standing migraine headaches which have increased in frequency and severity over the years since her stroke in 2007 to now daily headaches. These are mostly at night , severe , throbbing along with muscle pulling and tightness at back of neck. She was last seen by me in 2012 and was doing well on Topamax 50 mg but is now on 50 mg am and 100 mg hs and complains of memory loss, short term memory difficulties and takes tramadol as well as norco/vicodin several tablets daily mainly for  her chronic backache.she has difficulty with sleeping at night even with sleeping aids and feels tired. She had a sleep study years ago and did not have sleep apnea but was diagnosed with restless legs but denies disabling symptoms at present time.She also has depression and states she is doing well on cymbalta and also sees a Retail banker.She has difficullty with reading and has blurred vision and plans to see a eye doctor soon.   REVIEW OF SYSTEMS: Full 14 system review of systems performed and notable only for:  Constitutional: Excessive sweating  Eyes: Eye itching, eye redness, blurred vision Ear/Nose/Throat: N/A  Skin: Rash, itching Cardiovascular: N/A  Respiratory: N/A  Gastrointestinal: N/A  Genitourinary: N/A Hematology/Lymphatic: N/A  Endocrine: N/A Musculoskeletal: Joint pain, aching muscles, neck pain  Allergy/Immunology: N/A  Neurological: Memory loss, dizziness, headache, numbness, speech difficulty, weakness Psychiatric: Depression, nervous/anxious Sleep: Restless leg, insomnia   ALLERGIES: Allergies  Allergen Reactions  . Cefazolin Other (See Comments)    Rapid iv infusion caused swelling and burning at the iv site    HOME MEDICATIONS: Outpatient Prescriptions Prior to Visit  Medication Sig Dispense Refill  . albuterol (PROVENTIL HFA;VENTOLIN HFA) 108 (90 BASE) MCG/ACT inhaler Inhale 2 puffs into the lungs daily as needed for wheezing or shortness of breath.      Marland Kitchen albuterol (PROVENTIL) (2.5 MG/3ML) 0.083% nebulizer solution Take 2.5 mg by nebulization at bedtime as  needed for wheezing.      Marland Kitchen ALPRAZolam (XANAX) 0.5 MG tablet Take 0.5 mg by mouth 2 (two) times daily as needed for anxiety.      Marland Kitchen amitriptyline (ELAVIL) 25 MG tablet Take 2 tablets (50 mg total) by mouth at bedtime.  60 tablet  3  . amLODipine-valsartan (EXFORGE) 5-320 MG per tablet Take 1 tablet by mouth daily.       . calcium-vitamin D (OSCAL WITH D) 500-200 MG-UNIT per tablet Take 1 tablet by mouth  daily with breakfast.      . DULoxetine (CYMBALTA) 60 MG capsule Take 60 mg by mouth daily.       Marland Kitchen HYDROcodone-acetaminophen (NORCO) 5-325 MG per tablet Take 1 tablet by mouth every 8 (eight) hours as needed for pain.  11 tablet  0  . QUEtiapine (SEROQUEL) 50 MG tablet Take 50 mg by mouth at bedtime.      . topiramate (TOPAMAX) 50 MG tablet Take 1 tablet (50 mg total) by mouth 2 (two) times daily.  60 tablet  6   No facility-administered medications prior to visit.    PAST MEDICAL HISTORY: Past Medical History  Diagnosis Date  . CVA (cerebral infarction)   . Depression   . Hypertension   . Bipolar disorder   . Migraine   . High cholesterol     PAST SURGICAL HISTORY: Past Surgical History  Procedure Laterality Date  . Back surgery    . Partial hysterectomy      FAMILY HISTORY: Family History  Problem Relation Age of Onset  . Stroke Father   . Cancer Paternal Aunt   . Cancer Paternal Aunt   . Diabetes Mother   . Diabetes Brother     SOCIAL HISTORY: History   Social History  . Marital Status: Single    Spouse Name: N/A    Number of Children: 3  . Years of Education: 12   Occupational History  . Not on file.   Social History Main Topics  . Smoking status: Never Smoker   . Smokeless tobacco: Never Used  . Alcohol Use: Yes  . Drug Use: Yes    Special: Marijuana     Comment: occ  . Sexual Activity: Yes    Birth Control/ Protection: None   Other Topics Concern  . Not on file   Social History Narrative   Patient is single with 3 children.   Patient is right handed.   Patient has a high school education.   Patient drinks 3 cups daily.      PHYSICAL EXAM  Filed Vitals:   09/14/13 0856  BP: 106/82  Pulse: 85  Height: 5' 1.75" (1.568 m)  Weight: 232 lb (105.235 kg)   Body mass index is 42.8 kg/(m^2).  Generalized: Well developed, in no acute distress Neck: Circumference 17 inches, Mallampati 2+   Neurological examination  Mentation: Alert  oriented to time, place, history taking. Follows all commands speech and language fluent Cranial nerve II-XII: Pupils were equal round reactive to light. Extraocular movements were full, visual field were full on confrontational test. Facial sensation and strength were normal.  Uvula tongue midline. Head turning and shoulder shrug  were normal and symmetric. Motor: The motor testing reveals 5 over 5 strength of all 4 extremities. Good symmetric motor tone is noted throughout.  Sensory: Sensory testing is intact to soft touch on all 4 extremities. No evidence of extinction is noted.  Coordination: Cerebellar testing reveals good finger-nose-finger and heel-to-shin bilaterally.  Gait and station: Gait is normal. Tandem gait is normal. Romberg is negative. No drift is seen.  Reflexes: Deep tendon reflexes are symmetric and normal bilaterally.    DIAGNOSTIC DATA (LABS, IMAGING, TESTING) - I reviewed patient records, labs, notes, testing and imaging myself where available.  Lab Results  Component Value Date   WBC 9.4 03/13/2013   HGB 12.9 03/13/2013   HCT 39.1 03/13/2013   MCV 86.1 03/13/2013   PLT 213 03/13/2013      Component Value Date/Time   NA 142 03/13/2013 1935   K 4.6 03/13/2013 1935   CL 103 03/13/2013 1935   CO2 23 03/13/2013 1935   GLUCOSE 117* 03/13/2013 1935   BUN 10 03/13/2013 1935   CREATININE 0.62 03/13/2013 1935   CALCIUM 9.0 03/13/2013 1935   PROT 6.7 03/13/2012 1609   ALBUMIN 3.4* 03/13/2012 1609   AST 18 03/13/2012 1609   ALT 22 03/13/2012 1609   ALKPHOS 51 03/13/2012 1609   BILITOT 0.1* 03/13/2012 1609   GFRNONAA >90 03/13/2013 1935   GFRAA >90 03/13/2013 1935   Lab Results  Component Value Date   CHOL  Value: 100        ATP III CLASSIFICATION:  <200     mg/dL   Desirable  200-239  mg/dL   Borderline High  >=240    mg/dL   High 04/13/2007   HDL 47 04/13/2007   LDLCALC  Value: 45        Total Cholesterol/HDL:CHD Risk Coronary Heart Disease Risk Table                     Men   Women   1/2 Average Risk   3.4   3.3 04/13/2007   TRIG 38 04/13/2007   CHOLHDL 2.1 04/13/2007    ASSESSMENT AND PLAN 53 y.o. year old female  has a past medical history of CVA (cerebral infarction); Depression; Hypertension; Bipolar disorder; Migraine; and High cholesterol. here with;  1. chronic daily headache 2. History of CVA  The patient continues to have a daily headache. Although she does not take her medications as prescribed. I have advised the patient to take the Elavil 50 mg every night. She is currently only taking it every other night. I also advised her to take the Topamax 50 mg twice a day. She verbalized understanding. She also does not take her aspirin daily. I explained the importance of taking aspirin daily for stroke prevention. She verbalized understanding. The patient had a sleep study greater than 5 years ago. At that time she did not have sleep apnea. I will refer the patient for another sleep study. She has had witnessed apnea events, wakes up with a daily headache, and snores loudly. The patient should followup in 3 months or sooner if needed.   Ward Givens, MSN, NP-C 09/14/2013, 9:04 AM [facility] [address], [place],  [address] [phone]  Note: This document was prepared with digital dictation and possible smart phrase technology. Any transcriptional errors that result from this process are unintentional.

## 2013-09-16 ENCOUNTER — Telehealth: Payer: Self-pay | Admitting: Neurology

## 2013-09-16 DIAGNOSIS — E662 Morbid (severe) obesity with alveolar hypoventilation: Secondary | ICD-10-CM

## 2013-09-16 DIAGNOSIS — F319 Bipolar disorder, unspecified: Secondary | ICD-10-CM

## 2013-09-16 DIAGNOSIS — I6329 Cerebral infarction due to unspecified occlusion or stenosis of other precerebral arteries: Secondary | ICD-10-CM

## 2013-09-16 NOTE — Telephone Encounter (Signed)
Ward Givens, NP,refers patient for attended sleep study.  Height: 5' 1.75"  Weight: 232lb  BMI: 42.80  Past Medical History:  CVA (cerebral infarction)  .  Depression  .  Hypertension  .  Bipolar disorder  .  Migraine  .  High cholesterol    Sleep Symptoms:  The patient had a sleep study greater than 5 years ago. At that time she did not have sleep apnea. I will refer the patient for another sleep study. She has had witnessed apnea events, wakes up with a daily headache, and snores loudly. She was diagnosed with RLS but not sleep apnea. She does report that she snores and reports that her daughter has witnessed her having apnea events. She states that around 10:00am she begins to get sleepy and that last throughout the day.    Epworth Score: Called patient and went over ESS (0)   Medication: ALPRAZolam (Tab) XANAX 0.5 MG Take 0.5 mg by mouth 2 (two) times daily as needed for anxiety. ARIPiprazole (Tab) ABILIFY 5 MG Take 5 mg by mouth daily. Albuterol Sulfate (Aero Soln) PROVENTIL HFA;VENTOLIN HFA 108 (90 BASE) MCG/ACT Inhale 2 puffs into the lungs daily as needed for wheezing or shortness of breath. Albuterol Sulfate (Nebu Soln) PROVENTIL (2.5 MG/3ML) 0.083% Take 2.5 mg by nebulization at bedtime as needed for wheezing. Amitriptyline HCl (Tab) ELAVIL 25 MG Take 2 tablets (50 mg total) by mouth at bedtime. Amlodipine Besylate-Valsartan (Tab) EXFORGE 5-320 MG Take 1 tablet by mouth daily. Calcium Carbonate-Vitamin D (Tab) OSCAL WITH D 500-200 MG-UNIT Take 1 tablet by mouth daily with breakfast. DULoxetine HCl (Cap DR Particles) CYMBALTA 60 MG Take 60 mg by mouth daily. Hydrocodone-Acetaminophen (Tab) NORCO/VICODIN 5-325 MG Take 1 tablet by mouth every 8 (eight) hours as needed for pain. QUEtiapine Fumarate (Tab) SEROQUEL 50 MG Take 50 mg by mouth at bedtime. Topiramate (Tab) TOPAMAX 50 MG Take 1 tablet (50 mg total) by mouth 2 (two) times daily. TraMADol HCl (Tab) ULTRAM 50 MG      Ins: [employer] / Medicaid   Assessment & Plan: 53 y.o. year old female has a past medical history of CVA (cerebral infarction); Depression; Hypertension; Bipolar disorder; Migraine; and High cholesterol. here with;  1. chronic daily headache  2. History of CVA  The patient continues to have a daily headache. Although she does not take her medications as prescribed. I have advised the patient to take the Elavil 50 mg every night. She is currently only taking it every other night. I also advised her to take the Topamax 50 mg twice a day. She verbalized understanding. She also does not take her aspirin daily. I explained the importance of taking aspirin daily for stroke prevention. She verbalized understanding. The patient had a sleep study greater than 5 years ago. At that time she did not have sleep apnea. I will refer the patient for another sleep study. She has had witnessed apnea events, wakes up with a daily headache, and snores loudly. The patient should followup in 3 months or sooner if needed.   Please review patient information and submit instructions for scheduling and orders for sleep technologist. Thank you.

## 2013-09-16 NOTE — Telephone Encounter (Signed)
Dr Leonie Man / Ward Givens   patient with daily headaches, CVA , Morbidly obese with witnessed snoring and apneas, fatigue but no EDS ( epworth 0)  BMI 42. Needs Co2 and SPLIT study.   PCP Dr. Jonelle Sidle.

## 2013-10-15 IMAGING — CR DG ELBOW COMPLETE 3+V*L*
4 series · 4 of 4 positions shown · non-contrast
Comparison: None.

CLINICAL DATA: Elbow pain post injury

EXAM:
LEFT ELBOW - COMPLETE 3+ VIEW

[x elbow lat left]
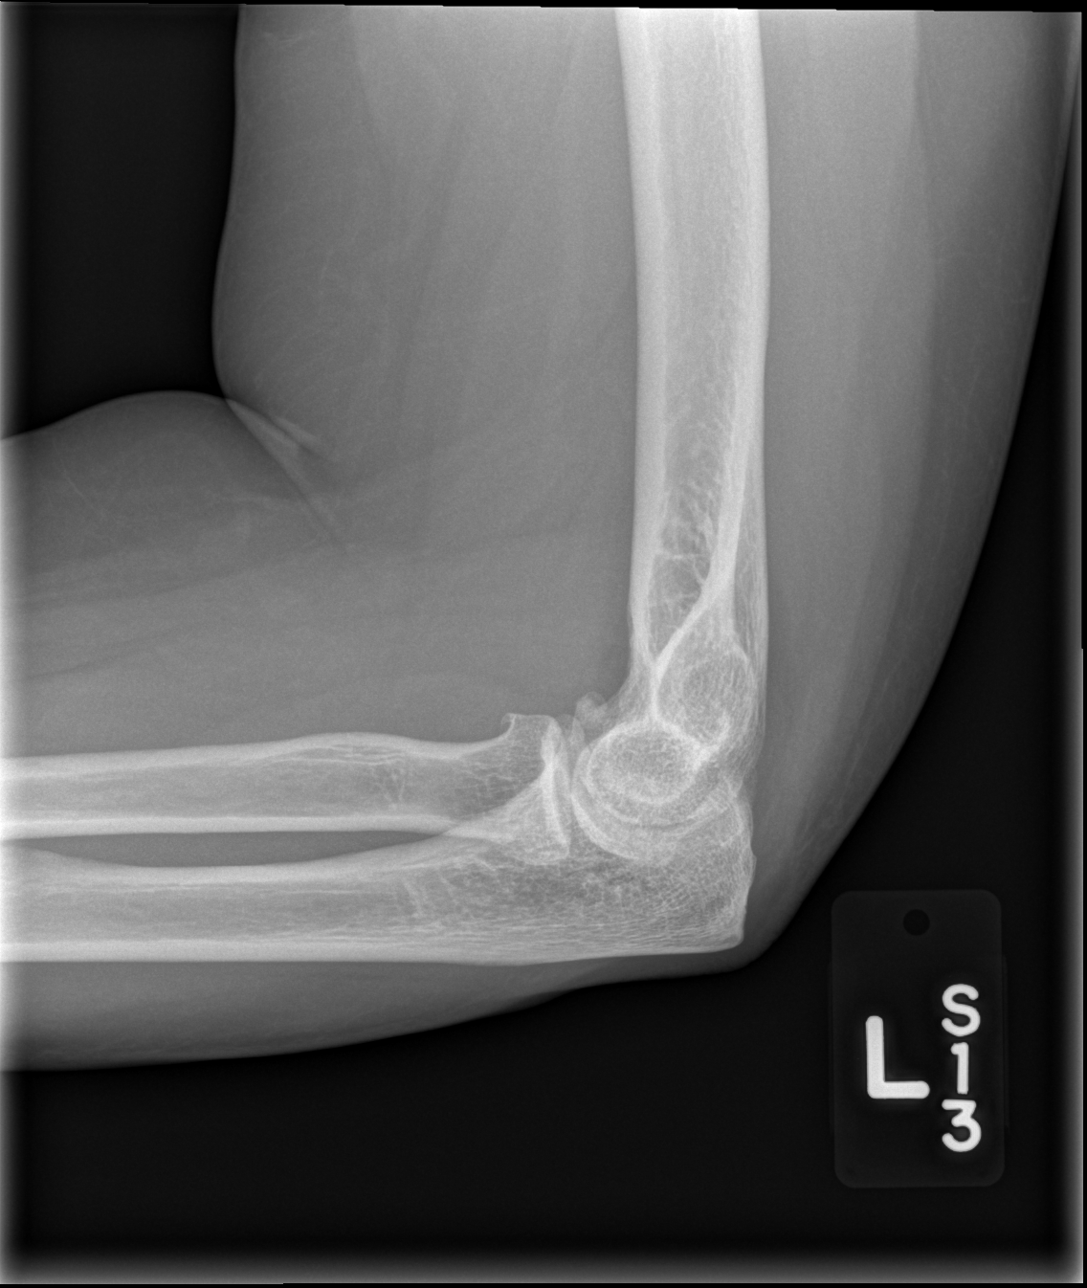

[x elbow obl left (1 of 2)]
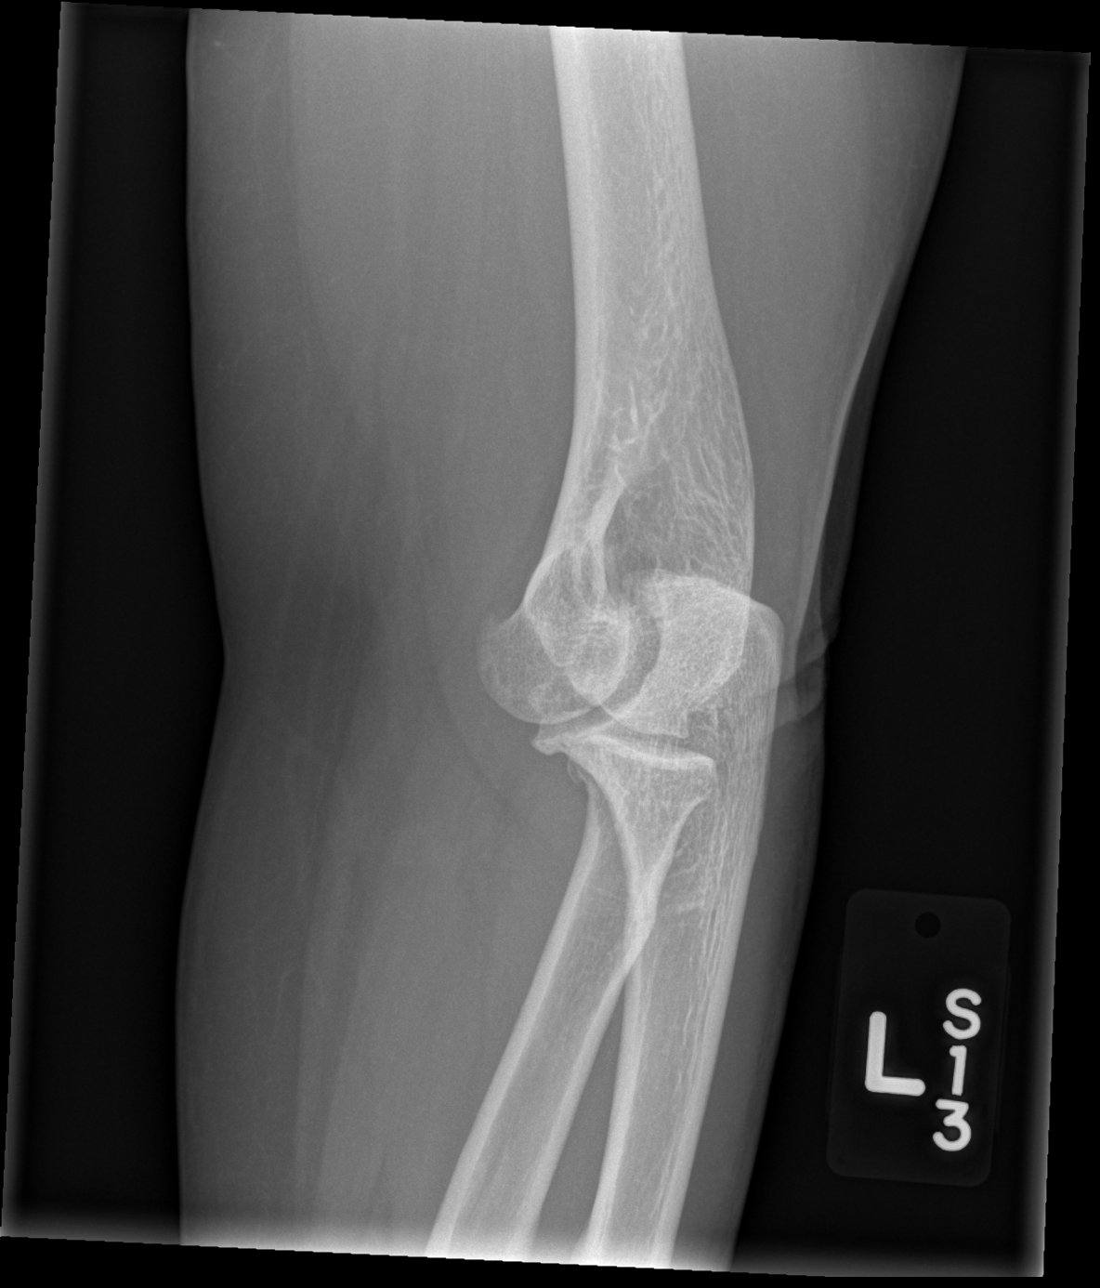

[x elbow ap left]
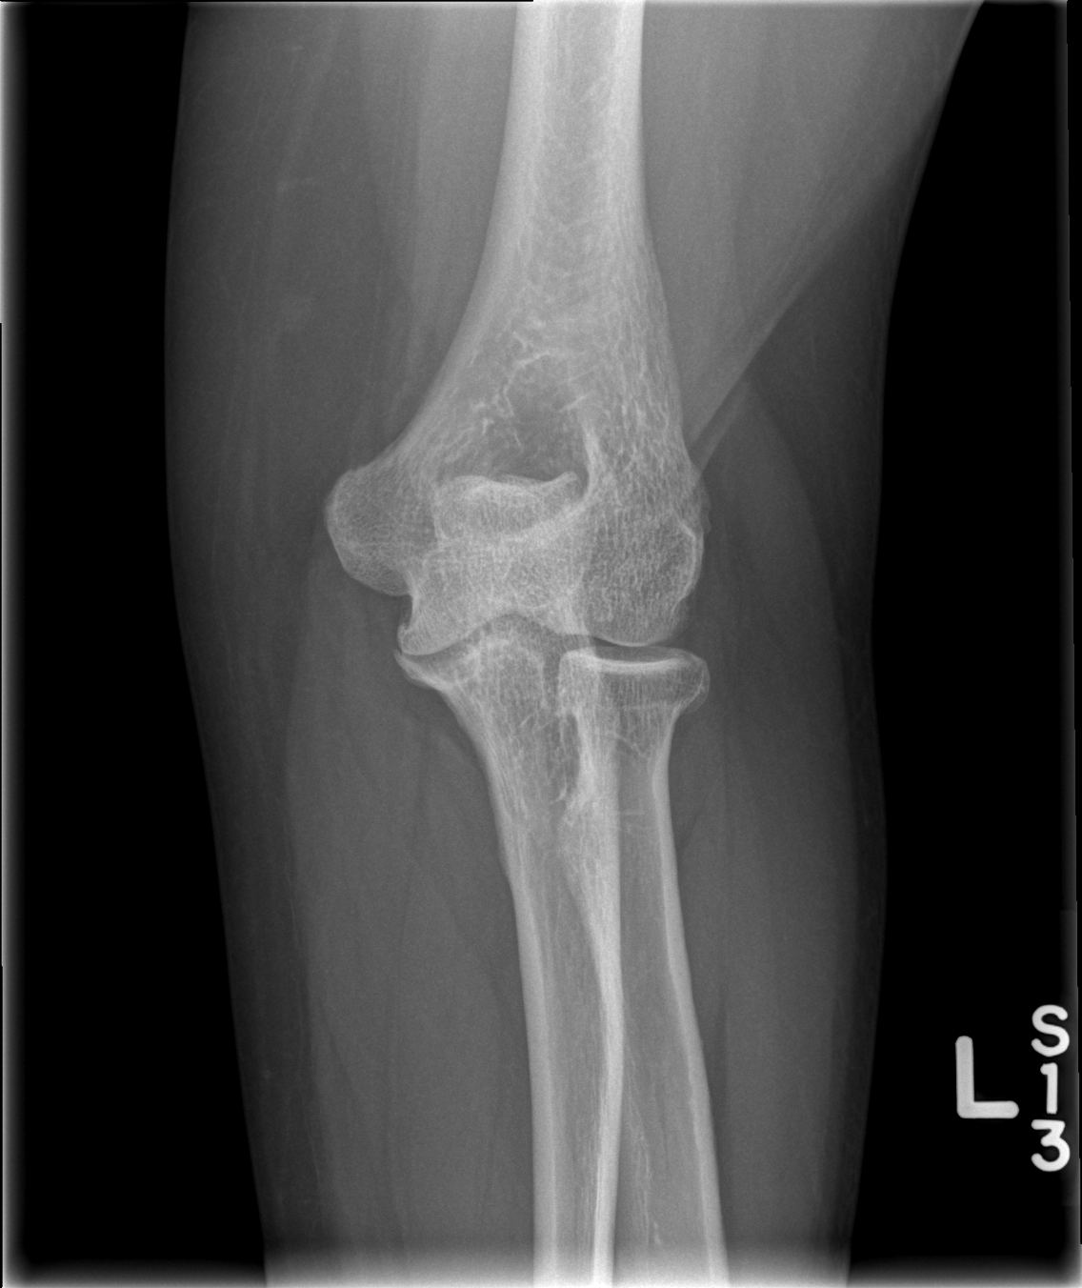

[x elbow obl left (2 of 2)]
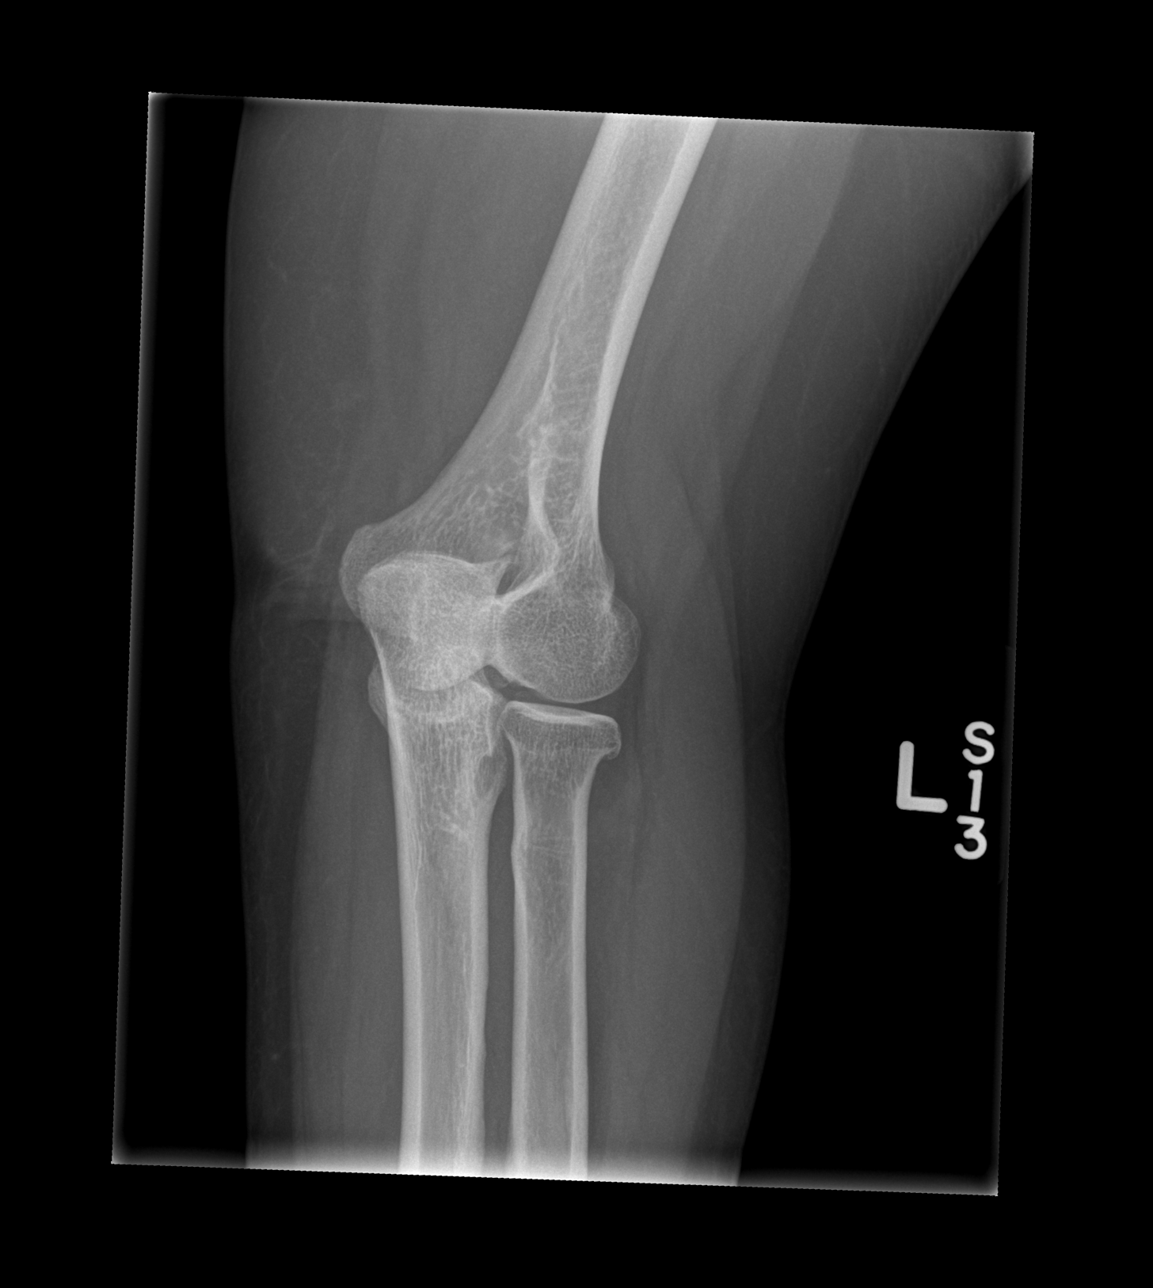

[4 of 4 positions shown; findings below may reference images not displayed]

FINDINGS: Four views of left elbow submitted. No acute fracture or
subluxation. No radiopaque foreign body. Mild spurring of radial
head. Probable small joint effusion. Minimal spurring of proximal
ulna.
IMPRESSION: No acute fracture or subluxation. Probable small joint effusion.
Mild degenerative changes as described above.

## 2013-11-03 ENCOUNTER — Other Ambulatory Visit: Payer: Self-pay

## 2013-11-03 DIAGNOSIS — Z1231 Encounter for screening mammogram for malignant neoplasm of breast: Secondary | ICD-10-CM

## 2013-11-11 ENCOUNTER — Emergency Department (HOSPITAL_COMMUNITY)
Admission: EM | Admit: 2013-11-11 | Discharge: 2013-11-11 | Disposition: A | Discharge status: Home / Self Care | Payer: Medicare HMO | Attending: Emergency Medicine | Admitting: Emergency Medicine

## 2013-11-11 ENCOUNTER — Encounter (HOSPITAL_COMMUNITY): Payer: Self-pay | Admitting: Emergency Medicine

## 2013-11-11 ENCOUNTER — Emergency Department (HOSPITAL_COMMUNITY): Payer: Medicare HMO

## 2013-11-11 DIAGNOSIS — R1012 Left upper quadrant pain: Secondary | ICD-10-CM | POA: Diagnosis not present

## 2013-11-11 DIAGNOSIS — R197 Diarrhea, unspecified: Secondary | ICD-10-CM | POA: Insufficient documentation

## 2013-11-11 DIAGNOSIS — G43909 Migraine, unspecified, not intractable, without status migrainosus: Secondary | ICD-10-CM | POA: Diagnosis not present

## 2013-11-11 DIAGNOSIS — F319 Bipolar disorder, unspecified: Secondary | ICD-10-CM | POA: Insufficient documentation

## 2013-11-11 DIAGNOSIS — E669 Obesity, unspecified: Secondary | ICD-10-CM | POA: Insufficient documentation

## 2013-11-11 DIAGNOSIS — Z79899 Other long term (current) drug therapy: Secondary | ICD-10-CM | POA: Diagnosis not present

## 2013-11-11 DIAGNOSIS — R112 Nausea with vomiting, unspecified: Secondary | ICD-10-CM | POA: Diagnosis not present

## 2013-11-11 DIAGNOSIS — I10 Essential (primary) hypertension: Secondary | ICD-10-CM | POA: Diagnosis not present

## 2013-11-11 DIAGNOSIS — Z8673 Personal history of transient ischemic attack (TIA), and cerebral infarction without residual deficits: Secondary | ICD-10-CM | POA: Insufficient documentation

## 2013-11-11 DIAGNOSIS — R109 Unspecified abdominal pain: Secondary | ICD-10-CM

## 2013-11-11 DIAGNOSIS — R1032 Left lower quadrant pain: Secondary | ICD-10-CM | POA: Diagnosis not present

## 2013-11-11 LAB — COMPREHENSIVE METABOLIC PANEL
ALBUMIN: 3.5 g/dL (ref 3.5–5.2)
ALT: 11 U/L (ref 0–35)
AST: 14 U/L (ref 0–37)
Alkaline Phosphatase: 60 U/L (ref 39–117)
Anion gap: 13 (ref 5–15)
BUN: 11 mg/dL (ref 6–23)
CO2: 23 mEq/L (ref 19–32)
CREATININE: 0.64 mg/dL (ref 0.50–1.10)
Calcium: 9.2 mg/dL (ref 8.4–10.5)
Chloride: 107 mEq/L (ref 96–112)
GFR calc Af Amer: >90 mL/min (ref >90)
Glucose, Bld: 132 mg/dL — ABNORMAL HIGH (ref 70–99)
Potassium: 4.5 mEq/L (ref 3.7–5.3)
Sodium: 143 mEq/L (ref 137–147)
TOTAL PROTEIN: 7 g/dL (ref 6.0–8.3)
Total Bilirubin: 0.2 mg/dL — ABNORMAL LOW (ref 0.3–1.2)

## 2013-11-11 LAB — CBC WITH DIFFERENTIAL/PLATELET
BASOS ABS: 0 10*3/uL (ref 0.0–0.1)
BASOS PCT: 0 % (ref 0–1)
EOS ABS: 0.3 10*3/uL (ref 0.0–0.7)
EOS PCT: 3 % (ref 0–5)
HCT: 40.4 % (ref 36.0–46.0)
Hemoglobin: 13 g/dL (ref 12.0–15.0)
Lymphocytes Relative: 30 % (ref 12–46)
Lymphs Abs: 3.7 10*3/uL (ref 0.7–4.0)
MCH: 27.7 pg (ref 26.0–34.0)
MCHC: 32.2 g/dL (ref 30.0–36.0)
MCV: 86.1 fL (ref 78.0–100.0)
MONO ABS: 0.6 10*3/uL (ref 0.1–1.0)
Monocytes Relative: 5 % (ref 3–12)
Neutro Abs: 7.7 10*3/uL (ref 1.7–7.7)
Neutrophils Relative %: 62 % (ref 43–77)
Platelets: 285 10*3/uL (ref 150–400)
RBC: 4.69 MIL/uL (ref 3.87–5.11)
RDW: 15.1 % (ref 11.5–15.5)
WBC: 12.4 10*3/uL — ABNORMAL HIGH (ref 4.0–10.5)

## 2013-11-11 LAB — URINALYSIS, ROUTINE W REFLEX MICROSCOPIC
Bilirubin Urine: NEGATIVE
Glucose, UA: NEGATIVE mg/dL
KETONES UR: NEGATIVE mg/dL
Leukocytes, UA: NEGATIVE
Nitrite: NEGATIVE
Protein, ur: NEGATIVE mg/dL
Specific Gravity, Urine: 1.015 (ref 1.005–1.030)
Urobilinogen, UA: 0.2 mg/dL (ref 0.0–1.0)
pH: 6 (ref 5.0–8.0)

## 2013-11-11 LAB — URINE MICROSCOPIC-ADD ON

## 2013-11-11 LAB — POC OCCULT BLOOD, ED: Fecal Occult Bld: POSITIVE — AB

## 2013-11-11 LAB — LIPASE, BLOOD: Lipase: 58 U/L (ref 11–59)

## 2013-11-11 MED ORDER — SODIUM CHLORIDE 0.9 % IV BOLUS (SEPSIS)
1000.0000 mL | Freq: Once | INTRAVENOUS | Status: AC
  Administered 2013-11-11: 1000 mL via INTRAVENOUS

## 2013-11-11 MED ORDER — DICYCLOMINE HCL 20 MG PO TABS: 20.0000 mg | ORAL_TABLET | Freq: Two times a day (BID) | ORAL | Status: DC

## 2013-11-11 MED ORDER — ONDANSETRON HCL 4 MG/2ML IJ SOLN
4.0000 mg | Freq: Once | INTRAMUSCULAR | Status: AC
  Administered 2013-11-11: 4 mg via INTRAVENOUS
  Filled 2013-11-11: qty 2

## 2013-11-11 MED ORDER — IOHEXOL 300 MG/ML  SOLN
25.0000 mL | Freq: Once | INTRAMUSCULAR | Status: AC | PRN
  Administered 2013-11-11: 25 mL via ORAL

## 2013-11-11 MED ORDER — IOHEXOL 300 MG/ML  SOLN
80.0000 mL | Freq: Once | INTRAMUSCULAR | Status: AC | PRN
  Administered 2013-11-11: 80 mL via INTRAVENOUS

## 2013-11-11 MED ORDER — ONDANSETRON 8 MG PO TBDP: 8.0000 mg | ORAL_TABLET | Freq: Three times a day (TID) | ORAL | Status: DC | PRN

## 2013-11-11 MED ORDER — SODIUM CHLORIDE 0.9 % IV SOLN
Freq: Once | INTRAVENOUS | Status: AC
  Administered 2013-11-11: 125 mL/h via INTRAVENOUS

## 2013-11-11 NOTE — Discharge Instructions (Signed)
Take Zofran as prescribed as needed for nausea. Take Bentyl for abdominal cramping. You can take Imodium for diarrhea. Follow up with your doctor or gastroenterology for recheck. Return if worsening symptoms.    Bloody Diarrhea Bloody diarrhea can be caused by many different conditions. Most of the time bloody diarrhea is the result of food poisoning or minor infections. Bloody diarrhea usually improves over 2 to 3 days of rest and fluid replacement. Other conditions that can cause bloody diarrhea include:  Internal bleeding.  Infection.  Diseases of the bowel and colon. Internal bleeding from an ulcer or bowel disease can be severe and requires hospital care or even surgery. DIAGNOSIS  To find out what is wrong your caregiver may check your:  Stool.  Blood.  Results from a test that looks inside the body (endoscopy). TREATMENT   Get plenty of rest.  Drink enough water and fluids to keep your urine clear or pale yellow.  Do not smoke.  Solid foods and dairy products should be avoided until your illness improves.  As you improve, slowly return to a regular diet with easily-digested foods first. Examples are:  Bananas.  Rice.  Toast.  Crackers. You should only need these for about 2 days before adding more normal foods to your diet.  Avoid spicy or fatty foods as well as caffeine and alcohol for several days.  Medicine to control cramping and diarrhea can relieve symptoms but may prolong some cases of bloody diarrhea. Antibiotics can speed recovery from diarrhea due to some bacterial infections. Call your caregiver if diarrhea does not get better in 3 days. SEEK MEDICAL CARE IF:   You do not improve after 3 days.  Your diarrhea improves but your stool appears black. SEEK IMMEDIATE MEDICAL CARE IF:   You become extremely weak or faint.  You become very sweaty.  You have increased pain or bleeding.  You develop repeated vomiting.  You vomit and you see blood or  the vomit looks black in color.  You have a fever. Document Released: 12/31/2004 Document Revised: 03/25/2011 Document Reviewed: 12/02/2008 [facility] Patient Information 2015 [place], [place]. This information is not intended to replace advice given to you by your health care provider. Make sure you discuss any questions you have with your health care provider.

## 2013-11-11 NOTE — ED Notes (Signed)
Attempted IV x 2 unsuccessful.

## 2013-11-11 NOTE — ED Provider Notes (Signed)
CSN: [id number]     Arrival date & time 11/11/13  0820 History   First MD Initiated Contact with Patient 11/11/13 [phone]     Chief Complaint  Patient presents with  . Diarrhea  . Abdominal Pain     (Consider location/radiation/quality/duration/timing/severity/associated sxs/prior Treatment) HPI Autumn Valenzuela is a 53 y.o. female with history of CVA, hypertension, depression, coming into the emergency department complaining of abdominal pain, nausea, vomiting, diarrhea. Patient states her symptoms began with diarrhea 3 days ago. States pain started after, today pain is worsened. Also states developed nausea vomiting today. States she has not had any recent ill contacts. No history of the same. No back pain. No fever or chills. No urinary symptoms. She reports her emesis was yellow. States she has not been anything yet today. She states her diarrhea is dark in color. States malodorous. 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 Also states her daughter is sick with upper respiratory symptoms, and works at a childcare. Patient denies any prior abdominal surgeries. No other complaints  Past Medical History  Diagnosis Date  . CVA (cerebral infarction)   . Depression   . Hypertension   . Bipolar disorder   . Migraine   . High cholesterol    Past Surgical History  Procedure Laterality Date  . Back surgery    . Partial hysterectomy     Family History  Problem Relation Age of Onset  . Stroke Father   . Cancer Paternal Aunt   . Cancer Paternal Aunt   . Diabetes Mother   . Diabetes Brother    History  Substance Use Topics  . Smoking status: Never Smoker   . Smokeless tobacco: Never Used  . Alcohol Use: Yes   OB History   Grav Para Term Preterm Abortions TAB SAB Ect Mult Living                 Review of Systems  Constitutional: Negative for fever and chills.  Respiratory: Negative for cough, chest tightness and shortness of breath.    Cardiovascular: Negative for chest pain, palpitations and leg swelling.  Gastrointestinal: Positive for nausea, vomiting, abdominal pain and diarrhea. Negative for constipation and blood in stool.  Genitourinary: Negative for dysuria, flank pain and pelvic pain.  Musculoskeletal: Negative for arthralgias, myalgias, neck pain and neck stiffness.  Skin: Negative for rash.  Neurological: Negative for dizziness, weakness and headaches.  All other systems reviewed and are negative.     Allergies  Cefazolin  Home Medications   Prior to Admission medications   Medication Sig Start Date End Date Taking? Authorizing Provider  albuterol (PROVENTIL HFA;VENTOLIN HFA) 108 (90 BASE) MCG/ACT inhaler Inhale 2 puffs into the lungs daily as needed for wheezing or shortness of breath.   Yes Historical Provider, MD  albuterol (PROVENTIL) (2.5 MG/3ML) 0.083% nebulizer solution Take 2.5 mg by nebulization at bedtime as needed for wheezing.   Yes Historical Provider, MD  ALPRAZolam Duanne Moron) 0.5 MG tablet Take 0.5 mg by mouth 2 (two) times daily as needed for anxiety.   Yes Historical Provider, MD  amitriptyline (ELAVIL) 25 MG tablet Take 2 tablets (50 mg total) by mouth at bedtime. 02/10/13  Yes Antony Contras, MD  amLODipine-valsartan (EXFORGE) 5-320 MG per tablet Take 1 tablet by mouth daily.    Yes Historical Provider, MD  ARIPiprazole (ABILIFY) 5 MG tablet Take 5 mg by mouth daily.  08/25/13  Yes Historical Provider, MD  calcium-vitamin D (OSCAL WITH D) 500-200 MG-UNIT per tablet Take 1 tablet by mouth daily with breakfast.   Yes Historical Provider, MD  DULoxetine (CYMBALTA) 60 MG capsule Take 60 mg by mouth daily.    Yes Historical Provider, MD  HYDROcodone-acetaminophen (NORCO) 5-325 MG per tablet Take 1 tablet by mouth every 8 (eight) hours as needed for pain. 08/18/12  Yes Marissa Sciacca, PA-C  QUEtiapine (SEROQUEL) 50 MG tablet Take 50 mg by mouth at bedtime.   Yes Historical Provider, MD  topiramate  (TOPAMAX) 50 MG tablet Take 1 tablet (50 mg total) by mouth 2 (two) times daily. 03/10/13  Yes Antony Contras, MD  traMADol (ULTRAM) 50 MG tablet Take 50 mg by mouth 2 (two) times daily as needed (for pain).  08/16/13  Yes Historical Provider, MD   BP 130/79  Pulse 76  Temp(Src) 97.8 F (36.6 C) (Oral)  Resp 18  Ht 5\' 1"  (1.549 m)  Wt 225 lb (102.059 kg)  BMI 42.54 kg/m2  SpO2 100% Physical Exam  Nursing note and vitals reviewed. Constitutional: She is oriented to person, place, and time. She appears well-developed and well-nourished. No distress.  obese  HENT:  Head: Normocephalic.  Lips and oral mucosa dry  Eyes: Conjunctivae are normal.  Neck: Neck supple.  Cardiovascular: Normal rate, regular rhythm and normal heart sounds.   Pulmonary/Chest: Effort normal and breath sounds normal. No respiratory distress. She has no wheezes. She has no rales.  Abdominal: Soft. Bowel sounds are normal. She exhibits no distension and no mass. There is tenderness. There is no rebound and no guarding.  LUQ and LLQ tenderness  Musculoskeletal: She exhibits no edema.  Neurological: She is alert and oriented to person, place, and time.  Skin: Skin is warm and dry.  Psychiatric: She has a normal mood and affect. Her behavior is normal.    ED Course  Procedures (including critical care time) Labs Review Labs Reviewed  CBC WITH DIFFERENTIAL - Abnormal; Notable for the following:    WBC 12.4 (*)    All other components within normal limits  COMPREHENSIVE METABOLIC PANEL - Abnormal; Notable for the following:    Glucose, Bld 132 (*)    Total Bilirubin 0.2 (*)    All other components within normal limits  URINALYSIS, ROUTINE W REFLEX MICROSCOPIC - Abnormal; Notable for the following:    APPearance HAZY (*)    Hgb urine dipstick TRACE (*)    All other components within normal limits  URINE MICROSCOPIC-ADD ON - Abnormal; Notable for the following:    Squamous Epithelial / LPF MANY (*)    Bacteria, UA  FEW (*)    All other components within normal limits  POC OCCULT BLOOD, ED - Abnormal; Notable for the following:    Fecal Occult Bld POSITIVE (*)    All other components within normal limits  LIPASE, BLOOD    Imaging Review Ct Abdomen Pelvis W Contrast  11/11/2013   CLINICAL DATA:  Lower abdominal pain.  EXAM: CT ABDOMEN AND PELVIS WITH CONTRAST  TECHNIQUE: Multidetector CT imaging of the abdomen and pelvis was performed using the standard protocol following bolus administration of intravenous contrast.  CONTRAST:  25mL OMNIPAQUE IOHEXOL 300 MG/ML  SOLN  COMPARISON:  [facility] 11/11/2013.  CT 05/27/2009  FINDINGS: Liver normal. Spleen normal. Pancreas normal. No biliary distention. Gallbladder nondistended.  Adrenals normal. No focal significant renal abnormality identified. Punctate lucency in the right kidney too small for evaluation statistically most likely a tiny cyst. No hydronephrosis  or obstructing ureteral stone. The bladder is nondistended.  Uterus adnexal unremarkable.  No free pelvic fluid.  Shotty inguinal and retroperitoneal lymph nodes are noted. Abdominal aorta visceral vessels are patent. Portal vein is patent.  The appendix is normal. No bowel distention. No free air. Stomach is nondistended. No mesenteric mass. No significant hernia.  Mild bibasilar atelectasis. Heart size normal. Postsurgical changes lumbar spine.  IMPRESSION: No acute abnormality.   Electronically Signed   By: Marcello Moores  Register   On: 11/11/2013 12:09   Dg Abd 2 Views  11/11/2013   CLINICAL DATA:  Diarrhea 3 days.  Pain.  EXAM: ABDOMEN - 2 VIEW  COMPARISON:  CT 03/10/2012.  FINDINGS: Soft tissue structures are unremarkable. The gas pattern is nonspecific. Moderate amount of stool in colon. Prior lumbosacral spine fusion. Abdomen and pelvic phleboliths. No acute bony abnormality.  IMPRESSION: Negative.   Electronically Signed   By: Marcello Moores  Register   On: 11/11/2013 10:01     EKG Interpretation None      MDM    Final diagnoses:  Abdominal pain  Diarrhea    Patients with nausea, vomiting, abdominal pain, diarrhea. She appears to be dry. We'll start IV fluids, ordered anti-emetics. We will get labs and abdominal x-ray. Urinalysis.   10:54 AM Patient with negative abdominal x-ray, labs unremarkable. Her Hemoccult stool is positive for blood. She continues to have some pain. Will get CT abdomen and pelvis to rule out diverticulitis or colitis.   12:35 AM Patient CT is negative. She is feeling better at this time. Able to tolerate by mouth fluids and solids. Will discharge home with Zofran, follow with primary care doctor and gastroenterology given rectal bleeding. Her hemoglobin is stable. Vital signs are normal.  Filed Vitals:   11/11/13 1030 11/11/13 1100 11/11/13 1115 11/11/13 1253  BP: 123/83 131/85 140/84 126/80  Pulse: 58 62 50 67  Temp:      TempSrc:      Resp: 26 24 17 20   Height:      Weight:      SpO2: 97% 98% 100% 100%     Florene Route Elfego Giammarino, PA-C 11/11/13 1547

## 2013-11-11 NOTE — ED Provider Notes (Signed)
Medical screening examination/treatment/procedure(s) were conducted as a shared visit with non-physician practitioner(s) and myself.  I personally evaluated the patient during the encounter.   EKG Interpretation None       Patient with vomiting, diarrhea and abd pain. Labs and CT negative. Symptoms controlled. D/C home.  Angiocath insertion Performed by: Sherwood Gambler T  Consent: Verbal consent obtained. Risks and benefits: risks, benefits and alternatives were discussed Time out: Immediately prior to procedure a "time out" was called to verify the correct patient, procedure, equipment, support staff and site/side marked as required.  Preparation: Patient was prepped and draped in the usual sterile fashion.  Vein Location: right basilic  Ultrasound Guided  Gauge: 20  Normal blood return and flush without difficulty Patient tolerance: Patient tolerated the procedure well with no immediate complications.    Ephraim Hamburger, MD 11/11/13 [phone]

## 2013-11-11 NOTE — ED Notes (Signed)
Pt here for diarrhea for several days, and chills, and abd pain with vomiting today

## 2013-11-19 ENCOUNTER — Ambulatory Visit
Admission: RE | Admit: 2013-11-19 | Discharge: 2013-11-19 | Disposition: A | Discharge status: Home / Self Care | Payer: Medicare HMO | Source: Ambulatory Visit

## 2013-11-19 DIAGNOSIS — Z1231 Encounter for screening mammogram for malignant neoplasm of breast: Secondary | ICD-10-CM

## 2013-11-29 ENCOUNTER — Encounter (HOSPITAL_COMMUNITY): Payer: Self-pay | Admitting: Emergency Medicine

## 2013-11-29 ENCOUNTER — Emergency Department (HOSPITAL_COMMUNITY): Payer: Medicare HMO

## 2013-11-29 ENCOUNTER — Emergency Department (HOSPITAL_COMMUNITY)
Admission: EM | Admit: 2013-11-29 | Discharge: 2013-11-29 | Disposition: A | Discharge status: Home / Self Care | Payer: Medicare HMO | Attending: Emergency Medicine | Admitting: Emergency Medicine

## 2013-11-29 DIAGNOSIS — F319 Bipolar disorder, unspecified: Secondary | ICD-10-CM | POA: Diagnosis not present

## 2013-11-29 DIAGNOSIS — Z8639 Personal history of other endocrine, nutritional and metabolic disease: Secondary | ICD-10-CM | POA: Insufficient documentation

## 2013-11-29 DIAGNOSIS — Z8673 Personal history of transient ischemic attack (TIA), and cerebral infarction without residual deficits: Secondary | ICD-10-CM | POA: Insufficient documentation

## 2013-11-29 DIAGNOSIS — G43909 Migraine, unspecified, not intractable, without status migrainosus: Secondary | ICD-10-CM | POA: Diagnosis not present

## 2013-11-29 DIAGNOSIS — R350 Frequency of micturition: Secondary | ICD-10-CM | POA: Diagnosis not present

## 2013-11-29 DIAGNOSIS — Z79899 Other long term (current) drug therapy: Secondary | ICD-10-CM | POA: Insufficient documentation

## 2013-11-29 DIAGNOSIS — R05 Cough: Secondary | ICD-10-CM

## 2013-11-29 DIAGNOSIS — R059 Cough, unspecified: Secondary | ICD-10-CM

## 2013-11-29 DIAGNOSIS — R079 Chest pain, unspecified: Secondary | ICD-10-CM | POA: Diagnosis present

## 2013-11-29 DIAGNOSIS — J069 Acute upper respiratory infection, unspecified: Secondary | ICD-10-CM | POA: Diagnosis not present

## 2013-11-29 DIAGNOSIS — I10 Essential (primary) hypertension: Secondary | ICD-10-CM | POA: Diagnosis not present

## 2013-11-29 DIAGNOSIS — J45901 Unspecified asthma with (acute) exacerbation: Secondary | ICD-10-CM | POA: Diagnosis not present

## 2013-11-29 LAB — BASIC METABOLIC PANEL
ANION GAP: 14 (ref 5–15)
BUN: 9 mg/dL (ref 6–23)
CO2: 21 mEq/L (ref 19–32)
Calcium: 9 mg/dL (ref 8.4–10.5)
Chloride: 105 mEq/L (ref 96–112)
Creatinine, Ser: 0.6 mg/dL (ref 0.50–1.10)
GFR calc Af Amer: >90 mL/min (ref >90)
Glucose, Bld: 133 mg/dL — ABNORMAL HIGH (ref 70–99)
Potassium: 4.2 mEq/L (ref 3.7–5.3)
SODIUM: 140 meq/L (ref 137–147)

## 2013-11-29 LAB — CBC WITH DIFFERENTIAL/PLATELET
BASOS ABS: 0 10*3/uL (ref 0.0–0.1)
BASOS PCT: 0 % (ref 0–1)
Eosinophils Absolute: 0.3 10*3/uL (ref 0.0–0.7)
Eosinophils Relative: 2 % (ref 0–5)
HCT: 37.7 % (ref 36.0–46.0)
Hemoglobin: 11.9 g/dL — ABNORMAL LOW (ref 12.0–15.0)
LYMPHS PCT: 17 % (ref 12–46)
Lymphs Abs: 2.7 10*3/uL (ref 0.7–4.0)
MCH: 27.2 pg (ref 26.0–34.0)
MCHC: 31.6 g/dL (ref 30.0–36.0)
MCV: 86.1 fL (ref 78.0–100.0)
MONO ABS: 0.9 10*3/uL (ref 0.1–1.0)
Monocytes Relative: 5 % (ref 3–12)
NEUTROS ABS: 12 10*3/uL — AB (ref 1.7–7.7)
NEUTROS PCT: 76 % (ref 43–77)
PLATELETS: 220 10*3/uL (ref 150–400)
RBC: 4.38 MIL/uL (ref 3.87–5.11)
RDW: 15.2 % (ref 11.5–15.5)
WBC: 15.9 10*3/uL — AB (ref 4.0–10.5)

## 2013-11-29 LAB — URINE MICROSCOPIC-ADD ON

## 2013-11-29 LAB — URINALYSIS, ROUTINE W REFLEX MICROSCOPIC
Bilirubin Urine: NEGATIVE
GLUCOSE, UA: NEGATIVE mg/dL
KETONES UR: NEGATIVE mg/dL
LEUKOCYTES UA: NEGATIVE
Nitrite: NEGATIVE
Protein, ur: NEGATIVE mg/dL
Specific Gravity, Urine: 1.011 (ref 1.005–1.030)
UROBILINOGEN UA: 0.2 mg/dL (ref 0.0–1.0)
pH: 7 (ref 5.0–8.0)

## 2013-11-29 LAB — TROPONIN I

## 2013-11-29 MED ORDER — PREDNISONE 20 MG PO TABS: ORAL_TABLET | ORAL | Status: DC

## 2013-11-29 MED ORDER — IPRATROPIUM-ALBUTEROL 0.5-2.5 (3) MG/3ML IN SOLN
3.0000 mL | Freq: Once | RESPIRATORY_TRACT | Status: AC
  Administered 2013-11-29: 3 mL via RESPIRATORY_TRACT
  Filled 2013-11-29: qty 3

## 2013-11-29 MED ORDER — PREDNISONE 20 MG PO TABS
60.0000 mg | ORAL_TABLET | Freq: Once | ORAL | Status: AC
  Administered 2013-11-29: 60 mg via ORAL
  Filled 2013-11-29: qty 3

## 2013-11-29 MED ORDER — AZITHROMYCIN 250 MG PO TABS: 250.0000 mg | ORAL_TABLET | Freq: Every day | ORAL | Status: DC

## 2013-11-29 MED ORDER — ALBUTEROL SULFATE HFA 108 (90 BASE) MCG/ACT IN AERS
2.0000 | INHALATION_SPRAY | Freq: Once | RESPIRATORY_TRACT | Status: AC
  Administered 2013-11-29: 2 via RESPIRATORY_TRACT
  Filled 2013-11-29: qty 6.7

## 2013-11-29 NOTE — ED Notes (Signed)
Breathing tx in progress, resp at bedside.

## 2013-11-29 NOTE — ED Provider Notes (Signed)
CSN: [id number]     Arrival date & time 11/29/13  1660 History   First MD Initiated Contact with Patient 11/29/13 [phone]     Chief Complaint  Patient presents with  . Chest Pain  . Shortness of Breath     (Consider location/radiation/quality/duration/timing/severity/associated sxs/prior Treatment) HPI Pt is a 53yo female with hx of asthma, presenting to ED with 2 day hx of SOB, cough, and congestion with associated sore throat, nausea, urinary frequency, and subjective fever. Pt states she coughs so much it causes her to gag.  Cough is occasionally productive with mucous.  She has tried OTC medications w/o relief.  States she breaks out into seats at night but has not measured her temperature with a thermometer.  Pt reports urinary frequency but admits to increasing her fluid intake.  Denies sick contacts or recent travel.  Past Medical History  Diagnosis Date  . CVA (cerebral infarction)   . Depression   . Hypertension   . Bipolar disorder   . Migraine   . High cholesterol    Past Surgical History  Procedure Laterality Date  . Back surgery    . Partial hysterectomy     Family History  Problem Relation Age of Onset  . Stroke Father   . Cancer Paternal Aunt   . Cancer Paternal Aunt   . Diabetes Mother   . Diabetes Brother    History  Substance Use Topics  . Smoking status: Never Smoker   . Smokeless tobacco: Never Used  . Alcohol Use: Yes   OB History    No data available     Review of Systems  Constitutional: Positive for fever ( subjective), chills, appetite change and fatigue.  HENT: Positive for congestion and sore throat. Negative for trouble swallowing and voice change.   Respiratory: Positive for cough and shortness of breath.   Cardiovascular: Negative for chest pain and palpitations.  Gastrointestinal: Positive for nausea. Negative for vomiting, abdominal pain, diarrhea and constipation.  Genitourinary: Positive for frequency. Negative for dysuria.  All  other systems reviewed and are negative.     Allergies  Cefazolin  Home Medications   Prior to Admission medications   Medication Sig Start Date End Date Taking? Authorizing Provider  albuterol (PROVENTIL HFA;VENTOLIN HFA) 108 (90 BASE) MCG/ACT inhaler Inhale 2 puffs into the lungs daily as needed for wheezing or shortness of breath.   Yes Historical Provider, MD  albuterol (PROVENTIL) (2.5 MG/3ML) 0.083% nebulizer solution Take 2.5 mg by nebulization at bedtime as needed for wheezing.   Yes Historical Provider, MD  ALPRAZolam Duanne Moron) 0.5 MG tablet Take 0.5 mg by mouth 2 (two) times daily as needed for anxiety.   Yes Historical Provider, MD  amitriptyline (ELAVIL) 25 MG tablet Take 2 tablets (50 mg total) by mouth at bedtime. 02/10/13  Yes Antony Contras, MD  amLODipine-valsartan (EXFORGE) 5-320 MG per tablet Take 1 tablet by mouth daily.    Yes Historical Provider, MD  ARIPiprazole (ABILIFY) 5 MG tablet Take 5 mg by mouth daily.  08/25/13  Yes Historical Provider, MD  dicyclomine (BENTYL) 20 MG tablet Take 1 tablet (20 mg total) by mouth 2 (two) times daily. 11/11/13  Yes Tatyana A Kirichenko, PA-C  DULoxetine (CYMBALTA) 60 MG capsule Take 60 mg by mouth daily.    Yes Historical Provider, MD  HYDROcodone-acetaminophen (NORCO) 5-325 MG per tablet Take 1 tablet by mouth every 8 (eight) hours as needed for pain. 08/18/12  Yes Marissa Sciacca, PA-C  ondansetron (ZOFRAN ODT)  8 MG disintegrating tablet Take 1 tablet (8 mg total) by mouth every 8 (eight) hours as needed for nausea or vomiting. 11/11/13  Yes Tatyana A Kirichenko, PA-C  QUEtiapine (SEROQUEL) 50 MG tablet Take 50 mg by mouth at bedtime.   Yes Historical Provider, MD  topiramate (TOPAMAX) 50 MG tablet Take 1 tablet (50 mg total) by mouth 2 (two) times daily. 03/10/13  Yes Antony Contras, MD  traMADol (ULTRAM) 50 MG tablet Take 50 mg by mouth 2 (two) times daily as needed (for pain).  08/16/13  Yes Historical Provider, MD  azithromycin  (ZITHROMAX) 250 MG tablet Take 1 tablet (250 mg total) by mouth daily. Take first 2 tablets together, then 1 every day until finished. 11/29/13   Noland Fordyce, PA-C  calcium-vitamin D (OSCAL WITH D) 500-200 MG-UNIT per tablet Take 1 tablet by mouth daily with breakfast.    Historical Provider, MD  predniSONE (DELTASONE) 20 MG tablet 3 tabs po day one, then 2 po daily x 4 days 11/29/13   Noland Fordyce, PA-C   BP 109/57 mmHg  Pulse 97  Temp(Src) 99 F (37.2 C) (Oral)  Resp 16  Ht 5\' 1"  (1.549 m)  Wt 223 lb (101.152 kg)  BMI 42.16 kg/m2  SpO2 95% Physical Exam  Constitutional: She appears well-developed and well-nourished. No distress.  Pt lying in exam bed with surgical mask on face. Appears not to feel well.  HENT:  Head: Normocephalic and atraumatic.  Right Ear: Hearing, tympanic membrane, external ear and ear canal normal.  Left Ear: Hearing, tympanic membrane, external ear and ear canal normal.  Nose: Nose normal.  Mouth/Throat: Uvula is midline and mucous membranes are normal. Posterior oropharyngeal erythema present. No oropharyngeal exudate, posterior oropharyngeal edema or tonsillar abscesses.  Eyes: Conjunctivae are normal. No scleral icterus.  Neck: Normal range of motion. Neck supple.  Cardiovascular: Normal rate, regular rhythm and normal heart sounds.   Pulmonary/Chest: Effort normal. No respiratory distress. She has wheezes. She has no rales. She exhibits no tenderness.  No respiratory distress. Diffuse expiratory wheeze on exam. No rhonchi or crackles.  Abdominal: Soft. Bowel sounds are normal. She exhibits no distension and no mass. There is no tenderness. There is no rebound and no guarding.  Musculoskeletal: Normal range of motion.  Neurological: She is alert.  Skin: Skin is warm and dry. She is not diaphoretic.  Nursing note and vitals reviewed.   ED Course  Procedures (including critical care time) Labs Review Labs Reviewed  URINALYSIS, ROUTINE W REFLEX  MICROSCOPIC - Abnormal; Notable for the following:    Hgb urine dipstick TRACE (*)    All other components within normal limits  CBC WITH DIFFERENTIAL - Abnormal; Notable for the following:    WBC 15.9 (*)    Hemoglobin 11.9 (*)    Neutro Abs 12.0 (*)    All other components within normal limits  BASIC METABOLIC PANEL - Abnormal; Notable for the following:    Glucose, Bld 133 (*)    All other components within normal limits  URINE MICROSCOPIC-ADD ON - Abnormal; Notable for the following:    Squamous Epithelial / LPF FEW (*)    All other components within normal limits  TROPONIN I    Imaging Review Dg Chest 2 View  11/29/2013   CLINICAL DATA:  Cough.  EXAM: CHEST  2 VIEW  COMPARISON:  03/07/2012  FINDINGS: The heart size and mediastinal contours are within normal limits. Both lungs are clear. The visualized skeletal structures are unremarkable.  IMPRESSION: No active cardiopulmonary disease.   Electronically Signed   By: Lucienne Capers M.D.   On: 11/29/2013 04:36     EKG Interpretation None      MDM   Final diagnoses:  Cough  URI, acute  Asthma exacerbation    Pt presenting to ED with URI symptoms of cough, congestion, sore throat and SOB. Hx of asthma. Reports using her inhaler with minimal relief. Symptoms started 2 days ago. No sick contacts.  Vitals: pt is afebrile, 95-96% O2 on RA.  No respiratory distress.  Elevated WBC at 15.9, CXR: no active cardiopulmonary disease   7:10 AM Pt states her breathing has improved since given duoneb. Lung sounds: CTAB Will discharge home with albuterol inhaler, prednisone dose pack as well as azithromycin. Advised pt to only start taking antibiotics if not improving in 2-3 days. 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 Advised pt to use acetaminophen and ibuprofen as needed for fever and pain. Encouraged rest and fluids. F/u with PCP as needed. Return precautions provided. Pt verbalized understanding and agreement with tx  plan.     Noland Fordyce, PA-C 11/29/13 [place], MD 11/30/13 [phone]

## 2013-11-29 NOTE — ED Notes (Signed)
Patient with shortness of breath and chest pain since yesterday.  Patient with URI, cough and congestion.  Patient states that it all got worse yesterday evening.

## 2013-11-29 NOTE — Discharge Instructions (Signed)

## 2013-11-29 NOTE — ED Notes (Signed)
Patient transported to X-ray 

## 2013-12-14 ENCOUNTER — Ambulatory Visit: Payer: Medicare PPO | Admitting: Adult Health

## 2014-06-11 ENCOUNTER — Encounter (HOSPITAL_COMMUNITY): Payer: Self-pay | Admitting: *Deleted

## 2014-06-11 ENCOUNTER — Emergency Department (HOSPITAL_COMMUNITY)
Admission: EM | Admit: 2014-06-11 | Discharge: 2014-06-11 | Disposition: A | Discharge status: Home / Self Care | Payer: Medicare PPO | Attending: Emergency Medicine | Admitting: Emergency Medicine

## 2014-06-11 DIAGNOSIS — M5432 Sciatica, left side: Secondary | ICD-10-CM

## 2014-06-11 DIAGNOSIS — F319 Bipolar disorder, unspecified: Secondary | ICD-10-CM | POA: Diagnosis not present

## 2014-06-11 DIAGNOSIS — I10 Essential (primary) hypertension: Secondary | ICD-10-CM | POA: Diagnosis not present

## 2014-06-11 DIAGNOSIS — Z792 Long term (current) use of antibiotics: Secondary | ICD-10-CM | POA: Insufficient documentation

## 2014-06-11 DIAGNOSIS — G8929 Other chronic pain: Secondary | ICD-10-CM | POA: Diagnosis not present

## 2014-06-11 DIAGNOSIS — Z79899 Other long term (current) drug therapy: Secondary | ICD-10-CM | POA: Insufficient documentation

## 2014-06-11 DIAGNOSIS — M545 Low back pain: Secondary | ICD-10-CM | POA: Insufficient documentation

## 2014-06-11 DIAGNOSIS — Z8639 Personal history of other endocrine, nutritional and metabolic disease: Secondary | ICD-10-CM | POA: Diagnosis not present

## 2014-06-11 DIAGNOSIS — Z8673 Personal history of transient ischemic attack (TIA), and cerebral infarction without residual deficits: Secondary | ICD-10-CM | POA: Diagnosis not present

## 2014-06-11 DIAGNOSIS — M549 Dorsalgia, unspecified: Secondary | ICD-10-CM

## 2014-06-11 MED ORDER — MELOXICAM 7.5 MG PO TABS: 7.5000 mg | ORAL_TABLET | Freq: Every day | ORAL | Status: DC

## 2014-06-11 MED ORDER — PREDNISONE 20 MG PO TABS
60.0000 mg | ORAL_TABLET | Freq: Once | ORAL | Status: AC
  Administered 2014-06-11: 60 mg via ORAL
  Filled 2014-06-11: qty 3

## 2014-06-11 MED ORDER — IBUPROFEN 400 MG PO TABS
800.0000 mg | ORAL_TABLET | Freq: Once | ORAL | Status: AC
Start: 2014-06-11 — End: 2014-06-11
  Administered 2014-06-11: 800 mg via ORAL
  Filled 2014-06-11: qty 2

## 2014-06-11 MED ORDER — PREDNISONE 20 MG PO TABS: ORAL_TABLET | ORAL | Status: DC

## 2014-06-11 NOTE — ED Notes (Signed)
Pt states R lower back pain since she woke up this am.  States pain increases with increases with movement.  Pain did not improve with nsaids or icy hot.  Denies urinary symptoms or numbness/tingling.

## 2014-06-11 NOTE — ED Notes (Signed)
Pt c/o lower back pain; denies injury; reports getting cortisone shots that decrease pain

## 2014-06-11 NOTE — ED Provider Notes (Signed)
CSN: [id number]     Arrival date & time 06/11/14  1452 History  This chart was scribed for non-physician practitioner, Noland Fordyce, PA-C working with Nat Christen, MD, by Erling Conte, ED Scribe. This patient was seen in room TR05C/TR05C and the patient's care was started at 4:25 PM.   Chief Complaint  Patient presents with  . Back Pain    The history is provided by the patient. No language interpreter was used.    HPI Comments: Autumn Valenzuela is a 54 y.o. female who presents to the Emergency Department complaining of constant, moderate, left lower back pain that began 6 hours ago. Pt has a h/o chronic back pain and has a h/o back surgery and 4 screws in her 5th disc in 2008. She denies any falls or injury. She states she took a bath in [place] salt and applied a heating pad with no relief. Pt has been taking Vicodin with mild relief which she has been prescribed by Dr. Rhona Raider for her shoulder. She states she is supposed to get surgery on her right shoulder. Pt reports she has gotten cortisone shots in the past which provided relief. She denies any nausea, vomiting, fever, or dysuria.  Denies change in bowel or bladder habits. Denies numbness or tingling in arms or legs.   Past Medical History  Diagnosis Date  . CVA (cerebral infarction)   . Depression   . Hypertension   . Bipolar disorder   . Migraine   . High cholesterol    Past Surgical History  Procedure Laterality Date  . Back surgery    . Partial hysterectomy     Family History  Problem Relation Age of Onset  . Stroke Father   . Cancer Paternal Aunt   . Cancer Paternal Aunt   . Diabetes Mother   . Diabetes Brother    History  Substance Use Topics  . Smoking status: Never Smoker   . Smokeless tobacco: Never Used  . Alcohol Use: Yes   OB History    No data available     Review of Systems  Constitutional: Negative for fever and chills.  Gastrointestinal: Negative for nausea and vomiting.  Genitourinary: Negative  for dysuria.  Musculoskeletal: Positive for back pain.    Allergies  Cefazolin  Home Medications   Prior to Admission medications   Medication Sig Start Date End Date Taking? Authorizing Provider  albuterol (PROVENTIL HFA;VENTOLIN HFA) 108 (90 BASE) MCG/ACT inhaler Inhale 2 puffs into the lungs daily as needed for wheezing or shortness of breath.    Historical Provider, MD  albuterol (PROVENTIL) (2.5 MG/3ML) 0.083% nebulizer solution Take 2.5 mg by nebulization at bedtime as needed for wheezing.    Historical Provider, MD  ALPRAZolam Duanne Moron) 0.5 MG tablet Take 0.5 mg by mouth 2 (two) times daily as needed for anxiety.    Historical Provider, MD  amitriptyline (ELAVIL) 25 MG tablet Take 2 tablets (50 mg total) by mouth at bedtime. 02/10/13   Garvin Fila, MD  amLODipine-valsartan (EXFORGE) 5-320 MG per tablet Take 1 tablet by mouth daily.     Historical Provider, MD  ARIPiprazole (ABILIFY) 5 MG tablet Take 5 mg by mouth daily.  08/25/13   Historical Provider, MD  azithromycin (ZITHROMAX) 250 MG tablet Take 1 tablet (250 mg total) by mouth daily. Take first 2 tablets together, then 1 every day until finished. 11/29/13   Noland Fordyce, PA-C  calcium-vitamin D (OSCAL WITH D) 500-200 MG-UNIT per tablet Take 1 tablet by  mouth daily with breakfast.    Historical Provider, MD  dicyclomine (BENTYL) 20 MG tablet Take 1 tablet (20 mg total) by mouth 2 (two) times daily. 11/11/13   Tatyana Kirichenko, PA-C  DULoxetine (CYMBALTA) 60 MG capsule Take 60 mg by mouth daily.     Historical Provider, MD  HYDROcodone-acetaminophen (NORCO) 5-325 MG per tablet Take 1 tablet by mouth every 8 (eight) hours as needed for pain. 08/18/12   Marissa Sciacca, PA-C  meloxicam (MOBIC) 7.5 MG tablet Take 1 tablet (7.5 mg total) by mouth daily. 06/11/14   Noland Fordyce, PA-C  ondansetron (ZOFRAN ODT) 8 MG disintegrating tablet Take 1 tablet (8 mg total) by mouth every 8 (eight) hours as needed for nausea or vomiting. 11/11/13    Tatyana Kirichenko, PA-C  predniSONE (DELTASONE) 20 MG tablet 3 tabs po day one, then 2 po daily x 4 days 11/29/13   Noland Fordyce, PA-C  predniSONE (DELTASONE) 20 MG tablet 3 tabs po day one, then 2 po daily x 4 days 06/11/14   Noland Fordyce, PA-C  QUEtiapine (SEROQUEL) 50 MG tablet Take 50 mg by mouth at bedtime.    Historical Provider, MD  topiramate (TOPAMAX) 50 MG tablet Take 1 tablet (50 mg total) by mouth 2 (two) times daily. 03/10/13   Garvin Fila, MD  traMADol (ULTRAM) 50 MG tablet Take 50 mg by mouth 2 (two) times daily as needed (for pain).  08/16/13   Historical Provider, MD   Triage Vitals: BP 94/51 mmHg  Pulse 80  Temp(Src) 98.5 F (36.9 C) (Oral)  Resp 18  Ht 5\' 1"  (1.549 m)  Wt 223 lb (101.152 kg)  BMI 42.16 kg/m2  SpO2 97%  Physical Exam  Constitutional: She is oriented to person, place, and time. She appears well-developed and well-nourished.  HENT:  Head: Normocephalic and atraumatic.  Eyes: EOM are normal.  Neck: Normal range of motion.  Cardiovascular: Normal rate.   Pulmonary/Chest: Effort normal.  Musculoskeletal: Normal range of motion. She exhibits tenderness.  Well healed surgical scar along lumbar spine. Pt notes she has screws to her L5. Tenderness along lumbar spine, left lower lumbar muscles, left buttock and left hip. Antalgic gait  Neurological: She is alert and oriented to person, place, and time.  Reflex Scores:      Patellar reflexes are 2+ on the right side and 2+ on the left side. Skin: Skin is warm and dry.  Psychiatric: She has a normal mood and affect. Her behavior is normal.  Nursing note and vitals reviewed.   ED Course  Procedures (including critical care time)  DIAGNOSTIC STUDIES: Oxygen Saturation is 97% on RA, normal by my interpretation.     Labs Review Labs Reviewed - No data to display  Imaging Review No results found.   EKG Interpretation None      MDM   Final diagnoses:  Exacerbation of chronic back pain  Left  sided sciatica   Pt is a 54yo female presenting to ED with c/o exacerbation of chronic low back pain with left sided sciatic.  No recent injury. No red flag symptoms.  Do not believe imaging needed at this time. Not concerned for emergent process taking place. Will tx symptomatically as needed for pain.  Pt seen by Dr. Rhona Raider for her shoulder. Pt currently has vicodin that should last until June per [place] Controlled Substance Database.  Rx: mobic and prednisone. Home care instructions provided. Advised to f/u with orthopedics later this week if not improving. Pt verbalized  understanding and agreement with tx plan.   I personally performed the services described in this documentation, which was scribed in my presence. The recorded information has been reviewed and is accurate.      Noland Fordyce, PA-C 06/12/14 [place], MD 06/20/14 [phone]

## 2014-07-20 ENCOUNTER — Other Ambulatory Visit: Payer: Self-pay

## 2014-07-20 DIAGNOSIS — R519 Headache, unspecified: Secondary | ICD-10-CM

## 2014-07-20 DIAGNOSIS — R51 Headache: Principal | ICD-10-CM

## 2014-07-20 MED ORDER — AMITRIPTYLINE HCL 25 MG PO TABS: 50.0000 mg | ORAL_TABLET | Freq: Every day | ORAL | Status: DC

## 2014-09-21 ENCOUNTER — Other Ambulatory Visit: Payer: Self-pay

## 2014-09-21 DIAGNOSIS — R51 Headache: Principal | ICD-10-CM

## 2014-09-21 DIAGNOSIS — R519 Headache, unspecified: Secondary | ICD-10-CM

## 2014-09-21 MED ORDER — AMITRIPTYLINE HCL 25 MG PO TABS: 50.0000 mg | ORAL_TABLET | Freq: Every day | ORAL | Status: DC

## 2014-11-07 ENCOUNTER — Ambulatory Visit: Payer: Self-pay | Admitting: Surgery

## 2014-11-07 NOTE — H&P (Signed)
Autumn Valenzuela 11/07/2014 10:10 AM Location: [facility] Patient #: [id number] DOB: 1960-07-04 Single / Language: Cleophus Molt / Race: Black or African American Female  History of Present Illness Marcello Moores A. Chauncey Sciulli MD; 11/07/2014 12:44 PM) Patient words: lipoma back  Patient sent at the request of Dr. Jonelle Sidle for mass over patient's central mid back. It is been present for many years but is getting larger. It causes mild burning and discomfort especially when she lays on it. There is been no redness or drainage. It is enlarged according to her daughter. It is relatively soft.  The patient is a 54 year old female.   Allergies Elbert Ewings, CMA; 11/07/2014 10:11 AM) CeFAZolin in Sodium Chloride *CEPHALOSPORINS*  Medication History Elbert Ewings, CMA; 11/07/2014 10:13 AM) ARIPiprazole (5MG  Tablet, Oral) Active. Amitriptyline HCl (25MG  Tablet, Oral two at bedtime) Active. Hydrocodone-Acetaminophen (5-325MG  Tablet, Oral) Active. DULoxetine HCl (60MG  Capsule DR Part, Oral two times daily) Active. Topiramate (50MG  Tablet, Oral two times daily) Active. Exforge (5-320MG  Tablet, Oral morni) Active. Vitamin B12 (100MCG Tablet, Oral) Active. Medications Reconciled     Review of Systems Elbert Ewings CMA; 11/07/2014 10:10 AM) General Present- Chills and Night Sweats. Not Present- Appetite Loss, Fatigue, Fever, Weight Gain and Weight Loss. Skin Present- Rash. Not Present- Change in Wart/Mole, Dryness, Hives, Jaundice, New Lesions, Non-Healing Wounds and Ulcer. HEENT Present- Seasonal Allergies, Sore Throat and Wears glasses/contact lenses. Not Present- Earache, Hearing Loss, Hoarseness, Nose Bleed, Oral Ulcers, Ringing in the Ears, Sinus Pain, Visual Disturbances and Yellow Eyes. Respiratory Present- Snoring. Not Present- Bloody sputum, Chronic Cough, Difficulty Breathing and Wheezing. Breast Present- Breast Mass. Not Present- Breast Pain, Nipple Discharge and Skin  Changes. Cardiovascular Not Present- Chest Pain, Difficulty Breathing Lying Down, Leg Cramps, Palpitations, Rapid Heart Rate, Shortness of Breath and Swelling of Extremities. Gastrointestinal Present- Difficulty Swallowing. Not Present- Abdominal Pain, Bloating, Bloody Stool, Change in Bowel Habits, Chronic diarrhea, Constipation, Excessive gas, Gets full quickly at meals, Hemorrhoids, Indigestion, Nausea, Rectal Pain and Vomiting. Musculoskeletal Present- Joint Pain. Not Present- Back Pain, Joint Stiffness, Muscle Pain, Muscle Weakness and Swelling of Extremities. Neurological Present- Headaches, Numbness and Tingling. Not Present- Decreased Memory, Fainting, Seizures, Tremor, Trouble walking and Weakness. Psychiatric Present- Bipolar and Depression. Not Present- Anxiety, Change in Sleep Pattern, Fearful and Frequent crying. Endocrine Present- Hot flashes. Not Present- Cold Intolerance, Excessive Hunger, Hair Changes, Heat Intolerance and New Diabetes. Hematology Not Present- Easy Bruising, Excessive bleeding, Gland problems, HIV and Persistent Infections.  Vitals Elbert Ewings CMA; 11/07/2014 10:14 AM) 11/07/2014 10:13 AM Weight: 229 lb Height: 61in Body Surface Area: 2 m Body Mass Index: 43.27 kg/m  Temp.: 98.109F(Temporal)  Pulse: 80 (Regular)  BP: 132/72 (Sitting, Left Arm, Standard)      Physical Exam (Ritta Hammes A. Arora Coakley MD; 11/07/2014 12:47 PM)  General Mental Status-Alert. General Appearance-Consistent with stated age. Hydration-Well hydrated. Voice-Normal.  Integumentary Note: 5 cm mobile fatty mass midline upper back   Head and Neck Head-normocephalic, atraumatic with no lesions or palpable masses. Trachea-midline. Thyroid Gland Characteristics - normal size and consistency.  Chest and Lung Exam Chest and lung exam reveals -quiet, even and easy respiratory effort with no use of accessory muscles and on auscultation, normal breath sounds, no  adventitious sounds and normal vocal resonance. Inspection Chest Wall - Normal. Back - normal.  Cardiovascular Cardiovascular examination reveals -normal heart sounds, regular rate and rhythm with no murmurs and normal pedal pulses bilaterally.  Neurologic Neurologic evaluation reveals -alert and oriented x 3 with no impairment of  recent or remote memory. Mental Status-Normal.  Musculoskeletal Normal Exam - Left-Upper Extremity Strength Normal and Lower Extremity Strength Normal. Normal Exam - Right-Upper Extremity Strength Normal and Lower Extremity Strength Normal.    Assessment & Plan (Renezmae Canlas A. Keyna Blizard MD; 11/07/2014 12:48 PM)  LIPOMA OF BACK (D17.1) Impression: central back 5 cm x 5 cm causing pain and desires excision risk of bleeding infection recurrence nerve injury blood vessel injury organ injury and the need for more surgery she agrees to proceed  Current Plans Pt Education - Overview of benign lesions of the skin: discussed with patient and provided information. Pt Education - [facility] General Post-op HCI The pathophysiology of skin & subcutaneous masses was discussed. Natural history risks without surgery were discussed. I recommended surgery to remove the mass. I explained the technique of removal with use of local anesthesia & possible need for more aggressive sedation/anesthesia for patient comfort.  Risks such as bleeding, infection, wound breakdown, heart attack, death, and other risks were discussed. I noted a good likelihood this will help address the problem. Possibility that this will not correct all symptoms was explained. Possibility of regrowth/recurrence of the mass was discussed. We will work to minimize complications. Questions were answered. The patient expresses understanding & wishes to proceed with surgery.

## 2014-11-10 NOTE — Telephone Encounter (Signed)
Error

## 2014-12-23 ENCOUNTER — Emergency Department (HOSPITAL_COMMUNITY): Payer: Medicare PPO

## 2014-12-23 ENCOUNTER — Encounter (HOSPITAL_COMMUNITY): Payer: Self-pay | Admitting: *Deleted

## 2014-12-23 ENCOUNTER — Emergency Department (HOSPITAL_COMMUNITY)
Admission: EM | Admit: 2014-12-23 | Discharge: 2014-12-23 | Disposition: A | Discharge status: Home / Self Care | Payer: Medicare PPO | Attending: Emergency Medicine | Admitting: Emergency Medicine

## 2014-12-23 DIAGNOSIS — G43909 Migraine, unspecified, not intractable, without status migrainosus: Secondary | ICD-10-CM | POA: Insufficient documentation

## 2014-12-23 DIAGNOSIS — F319 Bipolar disorder, unspecified: Secondary | ICD-10-CM | POA: Insufficient documentation

## 2014-12-23 DIAGNOSIS — Z8639 Personal history of other endocrine, nutritional and metabolic disease: Secondary | ICD-10-CM | POA: Insufficient documentation

## 2014-12-23 DIAGNOSIS — Z79899 Other long term (current) drug therapy: Secondary | ICD-10-CM | POA: Insufficient documentation

## 2014-12-23 DIAGNOSIS — Z8673 Personal history of transient ischemic attack (TIA), and cerebral infarction without residual deficits: Secondary | ICD-10-CM | POA: Diagnosis not present

## 2014-12-23 DIAGNOSIS — Z9889 Other specified postprocedural states: Secondary | ICD-10-CM | POA: Insufficient documentation

## 2014-12-23 DIAGNOSIS — I10 Essential (primary) hypertension: Secondary | ICD-10-CM | POA: Insufficient documentation

## 2014-12-23 DIAGNOSIS — M722 Plantar fascial fibromatosis: Secondary | ICD-10-CM | POA: Diagnosis not present

## 2014-12-23 DIAGNOSIS — Z791 Long term (current) use of non-steroidal anti-inflammatories (NSAID): Secondary | ICD-10-CM | POA: Insufficient documentation

## 2014-12-23 DIAGNOSIS — M79671 Pain in right foot: Secondary | ICD-10-CM | POA: Diagnosis present

## 2014-12-23 MED ORDER — OXYCODONE-ACETAMINOPHEN 5-325 MG PO TABS
1.0000 | ORAL_TABLET | Freq: Once | ORAL | Status: AC
Start: 2014-12-23 — End: 2014-12-23
  Administered 2014-12-23: 1 via ORAL

## 2014-12-23 MED ORDER — OXYCODONE-ACETAMINOPHEN 5-325 MG PO TABS
ORAL_TABLET | ORAL | Status: AC
  Filled 2014-12-23: qty 1

## 2014-12-23 NOTE — ED Notes (Signed)
Pt reports having surgery to remove fatty cyst off her back two days ago. Now having onset of pain to bottom of right foot, denies any wounds or injury.

## 2014-12-23 NOTE — Discharge Instructions (Signed)
Plantar Fasciitis Plantar fasciitis is a painful foot condition that affects the heel. It occurs when the band of tissue that connects the toes to the heel bone (plantar fascia) becomes irritated. This can happen after exercising too much or doing other repetitive activities (overuse injury). The pain from plantar fasciitis can range from mild irritation to severe pain that makes it difficult for you to walk or move. The pain is usually worse in the morning or after you have been sitting or lying down for a while. CAUSES This condition may be caused by:  Standing for long periods of time.  Wearing shoes that do not fit.  Doing high-impact activities, including running, aerobics, and ballet.  Being overweight.  Having an abnormal way of walking (gait).  Having tight calf muscles.  Having high arches in your feet.  Starting a new athletic activity. SYMPTOMS The main symptom of this condition is heel pain. Other symptoms include:  Pain that gets worse after activity or exercise.  Pain that is worse in the morning or after resting.  Pain that goes away after you walk for a few minutes. DIAGNOSIS This condition may be diagnosed based on your signs and symptoms. Your health care provider will also do a physical exam to check for:  A tender area on the bottom of your foot.  A high arch in your foot.  Pain when you move your foot.  Difficulty moving your foot. You may also need to have imaging studies to confirm the diagnosis. These can include:  X-rays.  Ultrasound.  MRI. TREATMENT  Treatment for plantar fasciitis depends on the severity of the condition. Your treatment may include:  Rest, ice, and over-the-counter pain medicines to manage your pain.  Exercises to stretch your calves and your plantar fascia.  A splint that holds your foot in a stretched, upward position while you sleep (night splint).  Physical therapy to relieve symptoms and prevent problems in the  future.  Cortisone injections to relieve severe pain.  Extracorporeal shock wave therapy (ESWT) to stimulate damaged plantar fascia with electrical impulses. It is often used as a last resort before surgery.  Surgery, if other treatments have not worked after 12 months. HOME CARE INSTRUCTIONS  Take medicines only as directed by your health care provider.  Avoid activities that cause pain.  Roll the bottom of your foot over a bag of ice or a bottle of cold water. Do this for 20 minutes, 3-4 times a day.  Perform simple stretches as directed by your health care provider.  Try wearing athletic shoes with air-sole or gel-sole cushions or soft shoe inserts.  Wear a night splint while sleeping, if directed by your health care provider.  Keep all follow-up appointments with your health care provider. PREVENTION   Do not perform exercises or activities that cause heel pain.  Consider finding low-impact activities if you continue to have problems.  Lose weight if you need to. The best way to prevent plantar fasciitis is to avoid the activities that aggravate your plantar fascia. SEEK MEDICAL CARE IF:  Your symptoms do not go away after treatment with home care measures.  Your pain gets worse.  Your pain affects your ability to move or do your daily activities.   This information is not intended to replace advice given to you by your health care provider. Make sure you discuss any questions you have with your health care provider.   Document Released: 09/25/2000 Document Revised: 09/21/2014 Document Reviewed: 11/10/2013 Elsevier   Interactive Patient Education 2016 [employer]  

## 2014-12-23 NOTE — ED Provider Notes (Signed)
CSN: [id number]     Arrival date & time 12/23/14  1832 History   First MD Initiated Contact with Patient 12/23/14 1926     Chief Complaint  Patient presents with  . Foot Pain  . Post-op Problem     (Consider location/radiation/quality/duration/timing/severity/associated sxs/prior Treatment) Patient is a 54 y.o. female presenting with lower extremity pain. The history is provided by the patient.  Foot Pain This is a new problem. The current episode started today. The problem occurs constantly. The problem has been gradually worsening. Pertinent negatives include no nausea. Nothing aggravates the symptoms. She has tried nothing for the symptoms. The treatment provided no relief.  Pt reports she had a lipoma removed from her back and has had decreased activity.  Pt was trying to increase her exertion tonight and she had pain in her right foot.   Past Medical History  Diagnosis Date  . CVA (cerebral infarction)   . Depression   . Hypertension   . Bipolar disorder ([place])   . Migraine   . High cholesterol    Past Surgical History  Procedure Laterality Date  . Back surgery    . Partial hysterectomy     Family History  Problem Relation Age of Onset  . Stroke Father   . Cancer Paternal Aunt   . Cancer Paternal Aunt   . Diabetes Mother   . Diabetes Brother    Social History  Substance Use Topics  . Smoking status: Never Smoker   . Smokeless tobacco: Never Used  . Alcohol Use: Yes   OB History    No data available     Review of Systems  Gastrointestinal: Negative for nausea.  All other systems reviewed and are negative.     Allergies  Cefazolin  Home Medications   Prior to Admission medications   Medication Sig Start Date End Date Taking? Authorizing Provider  albuterol (PROVENTIL HFA;VENTOLIN HFA) 108 (90 BASE) MCG/ACT inhaler Inhale 2 puffs into the lungs daily as needed for wheezing or shortness of breath.   Yes Historical Provider, MD  albuterol (PROVENTIL) (2.5  MG/3ML) 0.083% nebulizer solution Take 2.5 mg by nebulization at bedtime as needed for wheezing.   Yes Historical Provider, MD  ALPRAZolam Duanne Moron) 0.5 MG tablet Take 0.5 mg by mouth 2 (two) times daily as needed for anxiety.   Yes Historical Provider, MD  amitriptyline (ELAVIL) 25 MG tablet Take 2 tablets (50 mg total) by mouth at bedtime. 09/21/14  Yes Garvin Fila, MD  amLODipine-valsartan (EXFORGE) 5-320 MG per tablet Take 1 tablet by mouth daily.    Yes Historical Provider, MD  ARIPiprazole (ABILIFY) 5 MG tablet Take 5 mg by mouth daily.  08/25/13  Yes Historical Provider, MD  calcium-vitamin D (OSCAL WITH D) 500-200 MG-UNIT per tablet Take 1 tablet by mouth daily with breakfast.   Yes Historical Provider, MD  dicyclomine (BENTYL) 20 MG tablet Take 1 tablet (20 mg total) by mouth 2 (two) times daily. 11/11/13  Yes Tatyana Kirichenko, PA-C  DULoxetine (CYMBALTA) 60 MG capsule Take 60 mg by mouth at bedtime.    Yes Historical Provider, MD  meloxicam (MOBIC) 7.5 MG tablet Take 1 tablet (7.5 mg total) by mouth daily. 06/11/14  Yes Noland Fordyce, PA-C  Multiple Vitamin (MULTIVITAMIN WITH MINERALS) TABS tablet Take 1 tablet by mouth daily.   Yes Historical Provider, MD  predniSONE (DELTASONE) 20 MG tablet 3 tabs po day one, then 2 po daily x 4 days 06/11/14  Yes Noland Fordyce, PA-C  QUEtiapine (SEROQUEL) 50 MG tablet Take 50 mg by mouth at bedtime.   Yes Historical Provider, MD  topiramate (TOPAMAX) 50 MG tablet Take 1 tablet (50 mg total) by mouth 2 (two) times daily. 03/10/13  Yes Garvin Fila, MD  azithromycin (ZITHROMAX) 250 MG tablet Take 1 tablet (250 mg total) by mouth daily. Take first 2 tablets together, then 1 every day until finished. 11/29/13   Noland Fordyce, PA-C  HYDROcodone-acetaminophen (NORCO) 5-325 MG per tablet Take 1 tablet by mouth every 8 (eight) hours as needed for pain. 08/18/12   Marissa Sciacca, PA-C  ondansetron (ZOFRAN ODT) 8 MG disintegrating tablet Take 1 tablet (8 mg total) by  mouth every 8 (eight) hours as needed for nausea or vomiting. 11/11/13   Tatyana Kirichenko, PA-C  predniSONE (DELTASONE) 20 MG tablet 3 tabs po day one, then 2 po daily x 4 days 11/29/13   Noland Fordyce, PA-C  traMADol (ULTRAM) 50 MG tablet Take 50 mg by mouth 2 (two) times daily as needed (for pain).  08/16/13   Historical Provider, MD   BP 95/64 mmHg  Pulse 73  Temp(Src) 98.2 F (36.8 C) (Oral)  Resp 18  SpO2 98% Physical Exam  Constitutional: She is oriented to person, place, and time. She appears well-developed and well-nourished.  HENT:  Head: Normocephalic and atraumatic.  Musculoskeletal: She exhibits tenderness.  Tender right foot. Pain with movement.  No swelling, no deformity, nv and ns intact  Neurological: She is alert and oriented to person, place, and time. She has normal reflexes.  Skin: Skin is warm.  Psychiatric: She has a normal mood and affect.  Nursing note and vitals reviewed.   ED Course  Procedures (including critical care time) Labs Review Labs Reviewed - No data to display  Imaging Review Dg Foot Complete Right  12/23/2014  CLINICAL DATA:  Right foot pain, no known injury, lateral pain EXAM: RIGHT FOOT COMPLETE - 3+ VIEW COMPARISON:  None. FINDINGS: Three views of the right foot submitted. No acute fracture or subluxation. There is plantar spur of calcaneus. IMPRESSION: No acute fracture or subluxation.  Plantar spur of calcaneus. Electronically Signed   By: Lahoma Crocker M.D.   On: 12/23/2014 20:55   I have personally reviewed and evaluated these images and lab results as part of my medical decision-making.   EKG Interpretation None      MDM   Final diagnoses:  Plantar fasciitis    Post op shoe Ace wrap    Fransico Meadow, PA-C 12/23/14 2200  Dorie Rank, MD 12/24/14 1725

## 2014-12-23 NOTE — ED Notes (Signed)
Ace bandage and post op shoe applied. Pt given directions on shoe and wrap. Pedal pulses +2 after application. Pt comfortable with nad.

## 2015-02-13 ENCOUNTER — Encounter: Payer: Self-pay | Admitting: Gastroenterology

## 2015-03-17 ENCOUNTER — Other Ambulatory Visit: Payer: Self-pay

## 2015-03-17 DIAGNOSIS — Z1231 Encounter for screening mammogram for malignant neoplasm of breast: Secondary | ICD-10-CM

## 2015-03-28 ENCOUNTER — Ambulatory Visit: Payer: PRIVATE HEALTH INSURANCE

## 2015-04-12 ENCOUNTER — Ambulatory Visit: Payer: PRIVATE HEALTH INSURANCE

## 2015-04-24 ENCOUNTER — Emergency Department (HOSPITAL_COMMUNITY)
Admission: EM | Admit: 2015-04-24 | Discharge: 2015-04-25 | Disposition: A | Discharge status: Home / Self Care | Payer: Medicare PPO | Attending: Emergency Medicine | Admitting: Emergency Medicine

## 2015-04-24 ENCOUNTER — Encounter (HOSPITAL_COMMUNITY): Payer: Self-pay | Admitting: *Deleted

## 2015-04-24 ENCOUNTER — Emergency Department (HOSPITAL_COMMUNITY): Payer: Medicare PPO

## 2015-04-24 DIAGNOSIS — M722 Plantar fascial fibromatosis: Secondary | ICD-10-CM | POA: Diagnosis not present

## 2015-04-24 DIAGNOSIS — Z9889 Other specified postprocedural states: Secondary | ICD-10-CM | POA: Diagnosis not present

## 2015-04-24 DIAGNOSIS — Z791 Long term (current) use of non-steroidal anti-inflammatories (NSAID): Secondary | ICD-10-CM | POA: Insufficient documentation

## 2015-04-24 DIAGNOSIS — I10 Essential (primary) hypertension: Secondary | ICD-10-CM | POA: Insufficient documentation

## 2015-04-24 DIAGNOSIS — F329 Major depressive disorder, single episode, unspecified: Secondary | ICD-10-CM | POA: Insufficient documentation

## 2015-04-24 DIAGNOSIS — G43909 Migraine, unspecified, not intractable, without status migrainosus: Secondary | ICD-10-CM | POA: Diagnosis not present

## 2015-04-24 DIAGNOSIS — M5431 Sciatica, right side: Secondary | ICD-10-CM | POA: Insufficient documentation

## 2015-04-24 DIAGNOSIS — Z8673 Personal history of transient ischemic attack (TIA), and cerebral infarction without residual deficits: Secondary | ICD-10-CM | POA: Insufficient documentation

## 2015-04-24 DIAGNOSIS — Z79899 Other long term (current) drug therapy: Secondary | ICD-10-CM | POA: Diagnosis not present

## 2015-04-24 DIAGNOSIS — M549 Dorsalgia, unspecified: Secondary | ICD-10-CM | POA: Diagnosis present

## 2015-04-24 NOTE — ED Provider Notes (Signed)
CSN: [id number]     Arrival date & time 04/24/15  2028 History   By signing my name below, I, Autumn Valenzuela, attest that this documentation has been prepared under the direction and in the presence of Autumn Meadow, PA-C. Electronically Signed: Julien Valenzuela, ED Scribe. 04/24/2015. 11:36 PM.     Chief Complaint  Patient presents with  . Back Pain     The history is provided by the patient. No language interpreter was used.   HPI Comments: Autumn Valenzuela is a 55 y.o. female who has a hx of HTN and hypercholesteremia presents to the Emergency Department complaining of sudden onset, gradual worsening right foot pain that radiates up through her leg into her buttocks onset one week ago. Pt says walking increases and standing for long periods of time increases her pain. She says she has screws in her 5th disc. Pt has tried soaking in epsom salt to alleviate her symptoms with no relief. She has also not taken any medication to alleviate her pain.  She denies recent travel. Pt is a non-smoker.  Past Medical History  Diagnosis Date  . CVA (cerebral infarction)   . Depression   . Hypertension   . Bipolar disorder ([place])   . Migraine   . High cholesterol    Past Surgical History  Procedure Laterality Date  . Back surgery    . Partial hysterectomy     Family History  Problem Relation Age of Onset  . Stroke Father   . Cancer Paternal Aunt   . Cancer Paternal Aunt   . Diabetes Mother   . Diabetes Brother    Social History  Substance Use Topics  . Smoking status: Never Smoker   . Smokeless tobacco: Never Used  . Alcohol Use: Yes   OB History    No data available     Review of Systems  Musculoskeletal: Positive for back pain.  All other systems reviewed and are negative.     Allergies  Cefazolin  Home Medications   Prior to Admission medications   Medication Sig Start Date End Date Taking? Authorizing Provider  albuterol (PROVENTIL HFA;VENTOLIN HFA) 108 (90 BASE) MCG/ACT  inhaler Inhale 2 puffs into the lungs daily as needed for wheezing or shortness of breath.    Historical Provider, MD  albuterol (PROVENTIL) (2.5 MG/3ML) 0.083% nebulizer solution Take 2.5 mg by nebulization at bedtime as needed for wheezing.    Historical Provider, MD  ALPRAZolam Duanne Moron) 0.5 MG tablet Take 0.5 mg by mouth 2 (two) times daily as needed for anxiety.    Historical Provider, MD  amitriptyline (ELAVIL) 25 MG tablet Take 2 tablets (50 mg total) by mouth at bedtime. 09/21/14   Garvin Fila, MD  amLODipine-valsartan (EXFORGE) 5-320 MG per tablet Take 1 tablet by mouth daily.     Historical Provider, MD  ARIPiprazole (ABILIFY) 5 MG tablet Take 5 mg by mouth daily.  08/25/13   Historical Provider, MD  azithromycin (ZITHROMAX) 250 MG tablet Take 1 tablet (250 mg total) by mouth daily. Take first 2 tablets together, then 1 every day until finished. 11/29/13   Noland Fordyce, PA-C  calcium-vitamin D (OSCAL WITH D) 500-200 MG-UNIT per tablet Take 1 tablet by mouth daily with breakfast.    Historical Provider, MD  dicyclomine (BENTYL) 20 MG tablet Take 1 tablet (20 mg total) by mouth 2 (two) times daily. 11/11/13   Tatyana Kirichenko, PA-C  DULoxetine (CYMBALTA) 60 MG capsule Take 60 mg by mouth at  bedtime.     Historical Provider, MD  HYDROcodone-acetaminophen (NORCO) 5-325 MG per tablet Take 1 tablet by mouth every 8 (eight) hours as needed for pain. 08/18/12   Marissa Sciacca, PA-C  meloxicam (MOBIC) 7.5 MG tablet Take 1 tablet (7.5 mg total) by mouth daily. 06/11/14   Noland Fordyce, PA-C  Multiple Vitamin (MULTIVITAMIN WITH MINERALS) TABS tablet Take 1 tablet by mouth daily.    Historical Provider, MD  ondansetron (ZOFRAN ODT) 8 MG disintegrating tablet Take 1 tablet (8 mg total) by mouth every 8 (eight) hours as needed for nausea or vomiting. 11/11/13   Tatyana Kirichenko, PA-C  predniSONE (DELTASONE) 20 MG tablet 3 tabs po day one, then 2 po daily x 4 days 11/29/13   Noland Fordyce, PA-C   predniSONE (DELTASONE) 20 MG tablet 3 tabs po day one, then 2 po daily x 4 days 06/11/14   Noland Fordyce, PA-C  QUEtiapine (SEROQUEL) 50 MG tablet Take 50 mg by mouth at bedtime.    Historical Provider, MD  topiramate (TOPAMAX) 50 MG tablet Take 1 tablet (50 mg total) by mouth 2 (two) times daily. 03/10/13   Garvin Fila, MD  traMADol (ULTRAM) 50 MG tablet Take 50 mg by mouth 2 (two) times daily as needed (for pain).  08/16/13   Historical Provider, MD   Triage vitals: BP 114/79 mmHg  Pulse 98  Temp(Src) 98.1 F (36.7 C) (Oral)  Resp 20  SpO2 100% Physical Exam  Constitutional: She is oriented to person, place, and time. She appears well-developed and well-nourished.  HENT:  Head: Normocephalic.  Eyes: EOM are normal.  Neck: Normal range of motion.  Pulmonary/Chest: Effort normal.  Abdominal: She exhibits no distension.  Musculoskeletal: Normal range of motion.  Tender right sciatic notch, tender right achilles and right heel. Negative Homan's. Neurovascular and neurosensory are intact  Neurological: She is alert and oriented to person, place, and time.  Psychiatric: She has a normal mood and affect.  Nursing note and vitals reviewed.   ED Course  Procedures  DIAGNOSTIC STUDIES: Oxygen Saturation is 100% on RA, normal by my interpretation.  COORDINATION OF CARE:  11:36 PM Will order DG of right foot. Discussed treatment plan with pt at bedside and pt agreed to plan.  Labs Review Labs Reviewed - No data to display  Imaging Review Dg Foot Complete Right  04/24/2015  CLINICAL DATA:  Pain posteriorly for 3 days.  No known injury EXAM: RIGHT FOOT COMPLETE - 3+ VIEW COMPARISON:  December 23, 2014 FINDINGS: Frontal, oblique, and lateral views were obtained. There is no demonstrable fracture or dislocation. The joint spaces appear normal. No erosive change. There are stable posterior and inferior calcaneal spurs. IMPRESSION: Stable calcaneal spurs. No fracture or dislocation. No  appreciable arthropathy. Electronically Signed   By: Lowella Grip III M.D.   On: 04/24/2015 23:59   I have personally reviewed and evaluated these images and lab results as part of my medical decision-making.   EKG Interpretation None     Post op shoe meloxicam See youir Physicain for recheck in 1 week   MDM   Final diagnoses:  Sciatica, right  Plantar fasciitis, right      I personally performed the services in this documentation, which was scribed in my presence.  The recorded information has been reviewed and considered.   Ronnald Collum.  Hollace Kinnier Bowlegs, PA-C 04/25/15 0201  Leo Grosser, MD 04/25/15 [phone]

## 2015-04-24 NOTE — ED Notes (Signed)
Pt in c/o right leg pain and numbness down from her lower back and buttocks area, runs down the back of her leg into her heel, history of back problems, states she is on her feet at her job and that seems to be making symptoms worse

## 2015-04-25 DIAGNOSIS — M5431 Sciatica, right side: Secondary | ICD-10-CM | POA: Diagnosis not present

## 2015-04-25 MED ORDER — MELOXICAM 7.5 MG PO TABS: 7.5000 mg | ORAL_TABLET | Freq: Every day | ORAL | Status: DC

## 2015-04-25 NOTE — Discharge Instructions (Signed)
Sciatica °Sciatica is pain, weakness, numbness, or tingling along the path of the sciatic nerve. The nerve starts in the lower back and runs down the back of each leg. The nerve controls the muscles in the lower leg and in the back of the knee, while also providing sensation to the back of the thigh, lower leg, and the sole of your foot. Sciatica is a symptom of another medical condition. For instance, nerve damage or certain conditions, such as a herniated disk or bone spur on the spine, pinch or put pressure on the sciatic nerve. This causes the pain, weakness, or other sensations normally associated with sciatica. Generally, sciatica only affects one side of the body. °CAUSES  °· Herniated or slipped disc. °· Degenerative disk disease. °· A pain disorder involving the narrow muscle in the buttocks (piriformis syndrome). °· Pelvic injury or fracture. °· Pregnancy. °· Tumor (rare). °SYMPTOMS  °Symptoms can vary from mild to very severe. The symptoms usually travel from the low back to the buttocks and down the back of the leg. Symptoms can include: °· Mild tingling or dull aches in the lower back, leg, or hip. °· Numbness in the back of the calf or sole of the foot. °· Burning sensations in the lower back, leg, or hip. °· Sharp pains in the lower back, leg, or hip. °· Leg weakness. °· Severe back pain inhibiting movement. °These symptoms may get worse with coughing, sneezing, laughing, or prolonged sitting or standing. Also, being overweight may worsen symptoms. °DIAGNOSIS  °Your caregiver will perform a physical exam to look for common symptoms of sciatica. He or she may ask you to do certain movements or activities that would trigger sciatic nerve pain. Other tests may be performed to find the cause of the sciatica. These may include: °· Blood tests. °· X-rays. °· Imaging tests, such as an MRI or CT scan. °TREATMENT  °Treatment is directed at the cause of the sciatic pain. Sometimes, treatment is not necessary  and the pain and discomfort goes away on its own. If treatment is needed, your caregiver may suggest: °· Over-the-counter medicines to relieve pain. °· Prescription medicines, such as anti-inflammatory medicine, muscle relaxants, or narcotics. °· Applying heat or ice to the painful area. °· Steroid injections to lessen pain, irritation, and inflammation around the nerve. °· Reducing activity during periods of pain. °· Exercising and stretching to strengthen your abdomen and improve flexibility of your spine. Your caregiver may suggest losing weight if the extra weight makes the back pain worse. °· Physical therapy. °· Surgery to eliminate what is pressing or pinching the nerve, such as a bone spur or part of a herniated disk. °HOME CARE INSTRUCTIONS  °· Only take over-the-counter or prescription medicines for pain or discomfort as directed by your caregiver. °· Apply ice to the affected area for 20 minutes, 3-4 times a day for the first 48-72 hours. Then try heat in the same way. °· Exercise, stretch, or perform your usual activities if these do not aggravate your pain. °· Attend physical therapy sessions as directed by your caregiver. °· Keep all follow-up appointments as directed by your caregiver. °· Do not wear high heels or shoes that do not provide proper support. °· Check your mattress to see if it is too soft. A firm mattress may lessen your pain and discomfort. °SEEK IMMEDIATE MEDICAL CARE IF:  °· You lose control of your bowel or bladder (incontinence). °· You have increasing weakness in the lower back, pelvis, buttocks,   or legs.  You have redness or swelling of your back.  You have a burning sensation when you urinate.  You have pain that gets worse when you lie down or awakens you at night.  Your pain is worse than you have experienced in the past.  Your pain is lasting longer than 4 weeks.  You are suddenly losing weight without reason. MAKE SURE YOU:  Understand these  instructions.  Will watch your condition.  Will get help right away if you are not doing well or get worse.   This information is not intended to replace advice given to you by your health care provider. Make sure you discuss any questions you have with your health care provider.   Document Released: 12/25/2000 Document Revised: 09/21/2014 Document Reviewed: 05/12/2011 Elsevier Interactive Patient Education 2016 [employer] Plantar Fasciitis Plantar fasciitis is a painful foot condition that affects the heel. It occurs when the band of tissue that connects the toes to the heel bone (plantar fascia) becomes irritated. This can happen after exercising too much or doing other repetitive activities (overuse injury). The pain from plantar fasciitis can range from mild irritation to severe pain that makes it difficult for you to walk or move. The pain is usually worse in the morning or after you have been sitting or lying down for a while. CAUSES This condition may be caused by:  Standing for long periods of time.  Wearing shoes that do not fit.  Doing high-impact activities, including running, aerobics, and ballet.  Being overweight.  Having an abnormal way of walking (gait).  Having tight calf muscles.  Having high arches in your feet.  Starting a new athletic activity. SYMPTOMS The main symptom of this condition is heel pain. Other symptoms include:  Pain that gets worse after activity or exercise.  Pain that is worse in the morning or after resting.  Pain that goes away after you walk for a few minutes. DIAGNOSIS This condition may be diagnosed based on your signs and symptoms. Your health care provider will also do a physical exam to check for:  A tender area on the bottom of your foot.  A high arch in your foot.  Pain when you move your foot.  Difficulty moving your foot. You may also need to have imaging studies to confirm the diagnosis. These can  include:  X-rays.  Ultrasound.  MRI. TREATMENT  Treatment for plantar fasciitis depends on the severity of the condition. Your treatment may include:  Rest, ice, and over-the-counter pain medicines to manage your pain.  Exercises to stretch your calves and your plantar fascia.  A splint that holds your foot in a stretched, upward position while you sleep (night splint).  Physical therapy to relieve symptoms and prevent problems in the future.  Cortisone injections to relieve severe pain.  Extracorporeal shock wave therapy (ESWT) to stimulate damaged plantar fascia with electrical impulses. It is often used as a last resort before surgery.  Surgery, if other treatments have not worked after 12 months. HOME CARE INSTRUCTIONS  Take medicines only as directed by your health care provider.  Avoid activities that cause pain.  Roll the bottom of your foot over a bag of ice or a bottle of cold water. Do this for 20 minutes, 3-4 times a day.  Perform simple stretches as directed by your health care provider.  Try wearing athletic shoes with air-sole or gel-sole cushions or soft shoe inserts.  Wear a night splint while sleeping, if  directed by your health care provider.  Keep all follow-up appointments with your health care provider. PREVENTION   Do not perform exercises or activities that cause heel pain.  Consider finding low-impact activities if you continue to have problems.  Lose weight if you need to. The best way to prevent plantar fasciitis is to avoid the activities that aggravate your plantar fascia. SEEK MEDICAL CARE IF:  Your symptoms do not go away after treatment with home care measures.  Your pain gets worse.  Your pain affects your ability to move or do your daily activities.   This information is not intended to replace advice given to you by your health care provider. Make sure you discuss any questions you have with your health care provider.    Document Released: 09/25/2000 Document Revised: 09/21/2014 Document Reviewed: 11/10/2013 Elsevier Interactive Patient Education [employer].

## 2015-04-25 NOTE — ED Notes (Signed)
Ortho at bedside applying show

## 2015-04-25 NOTE — ED Notes (Signed)
Patient able to ambulate independently  

## 2015-05-08 ENCOUNTER — Encounter: Payer: Self-pay | Admitting: Podiatry

## 2015-05-08 ENCOUNTER — Ambulatory Visit (INDEPENDENT_AMBULATORY_CARE_PROVIDER_SITE_OTHER): Payer: Medicare PPO | Admitting: Podiatry

## 2015-05-08 ENCOUNTER — Ambulatory Visit: Payer: Self-pay

## 2015-05-08 ENCOUNTER — Ambulatory Visit (INDEPENDENT_AMBULATORY_CARE_PROVIDER_SITE_OTHER): Payer: Medicare PPO

## 2015-05-08 VITALS — BP 104/69 | HR 70 | Resp 16 | Ht 61.0 in | Wt 223.0 lb

## 2015-05-08 DIAGNOSIS — M722 Plantar fascial fibromatosis: Secondary | ICD-10-CM | POA: Diagnosis not present

## 2015-05-08 MED ORDER — DICLOFENAC SODIUM 75 MG PO TBEC: 75.0000 mg | DELAYED_RELEASE_TABLET | Freq: Two times a day (BID) | ORAL | Status: DC

## 2015-05-08 MED ORDER — TRIAMCINOLONE ACETONIDE 10 MG/ML IJ SUSP
10.0000 mg | Freq: Once | INTRAMUSCULAR | Status: AC
  Administered 2015-05-08: 10 mg

## 2015-05-08 NOTE — Patient Instructions (Signed)
Plantar Fasciitis (Heel Spur Syndrome) with Rehab The plantar fascia is a fibrous, ligament-like, soft-tissue structure that spans the bottom of the foot. Plantar fasciitis is a condition that causes pain in the foot due to inflammation of the tissue. SYMPTOMS   Pain and tenderness on the underneath side of the foot.  Pain that worsens with standing or walking. CAUSES  Plantar fasciitis is caused by irritation and injury to the plantar fascia on the underneath side of the foot. Common mechanisms of injury include:  Direct trauma to bottom of the foot.  Damage to a small nerve that runs under the foot where the main fascia attaches to the heel bone.  Stress placed on the plantar fascia due to bone spurs. RISK INCREASES WITH:   Activities that place stress on the plantar fascia (running, jumping, pivoting, or cutting).  Poor strength and flexibility.  Improperly fitted shoes.  Tight calf muscles.  Flat feet.  Failure to warm-up properly before activity.  Obesity. PREVENTION  Warm up and stretch properly before activity.  Allow for adequate recovery between workouts.  Maintain physical fitness:  Strength, flexibility, and endurance.  Cardiovascular fitness.  Maintain a health body weight.  Avoid stress on the plantar fascia.  Wear properly fitted shoes, including arch supports for individuals who have flat feet.  PROGNOSIS  If treated properly, then the symptoms of plantar fasciitis usually resolve without surgery. However, occasionally surgery is necessary.  RELATED COMPLICATIONS   Recurrent symptoms that may result in a chronic condition.  Problems of the lower back that are caused by compensating for the injury, such as limping.  Pain or weakness of the foot during push-off following surgery.  Chronic inflammation, scarring, and partial or complete fascia tear, occurring more often from repeated injections.  TREATMENT  Treatment initially involves the  use of ice and medication to help reduce pain and inflammation. The use of strengthening and stretching exercises may help reduce pain with activity, especially stretches of the Achilles tendon. These exercises may be performed at home or with a therapist. Your caregiver may recommend that you use heel cups of arch supports to help reduce stress on the plantar fascia. Occasionally, corticosteroid injections are given to reduce inflammation. If symptoms persist for greater than 6 months despite non-surgical (conservative), then surgery may be recommended.   MEDICATION   If pain medication is necessary, then nonsteroidal anti-inflammatory medications, such as aspirin and ibuprofen, or other minor pain relievers, such as acetaminophen, are often recommended.  Do not take pain medication within 7 days before surgery.  Prescription pain relievers may be given if deemed necessary by your caregiver. Use only as directed and only as much as you need.  Corticosteroid injections may be given by your caregiver. These injections should be reserved for the most serious cases, because they may only be given a certain number of times.  HEAT AND COLD  Cold treatment (icing) relieves pain and reduces inflammation. Cold treatment should be applied for 10 to 15 minutes every 2 to 3 hours for inflammation and pain and immediately after any activity that aggravates your symptoms. Use ice packs or massage the area with a piece of ice (ice massage).  Heat treatment may be used prior to performing the stretching and strengthening activities prescribed by your caregiver, physical therapist, or athletic trainer. Use a heat pack or soak the injury in warm water.  SEEK IMMEDIATE MEDICAL CARE IF:  Treatment seems to offer no benefit, or the condition worsens.  Any medications   produce adverse side effects.  EXERCISES- RANGE OF MOTION (ROM) AND STRETCHING EXERCISES - Plantar Fasciitis (Heel Spur Syndrome) These exercises  may help you when beginning to rehabilitate your injury. Your symptoms may resolve with or without further involvement from your physician, physical therapist or athletic trainer. While completing these exercises, remember:   Restoring tissue flexibility helps normal motion to return to the joints. This allows healthier, less painful movement and activity.  An effective stretch should be held for at least 30 seconds.  A stretch should never be painful. You should only feel a gentle lengthening or release in the stretched tissue.  RANGE OF MOTION - Toe Extension, Flexion  Sit with your right / left leg crossed over your opposite knee.  Grasp your toes and gently pull them back toward the top of your foot. You should feel a stretch on the bottom of your toes and/or foot.  Hold this stretch for 10 seconds.  Now, gently pull your toes toward the bottom of your foot. You should feel a stretch on the top of your toes and or foot.  Hold this stretch for 10 seconds. Repeat  times. Complete this stretch 3 times per day.   RANGE OF MOTION - Ankle Dorsiflexion, Active Assisted  Remove shoes and sit on a chair that is preferably not on a carpeted surface.  Place right / left foot under knee. Extend your opposite leg for support.  Keeping your heel down, slide your right / left foot back toward the chair until you feel a stretch at your ankle or calf. If you do not feel a stretch, slide your bottom forward to the edge of the chair, while still keeping your heel down.  Hold this stretch for 10 seconds. Repeat 3 times. Complete this stretch 2 times per day.   STRETCH  Gastroc, Standing  Place hands on wall.  Extend right / left leg, keeping the front knee somewhat bent.  Slightly point your toes inward on your back foot.  Keeping your right / left heel on the floor and your knee straight, shift your weight toward the wall, not allowing your back to arch.  You should feel a gentle stretch  in the right / left calf. Hold this position for 10 seconds. Repeat 3 times. Complete this stretch 2 times per day.  STRETCH  Soleus, Standing  Place hands on wall.  Extend right / left leg, keeping the other knee somewhat bent.  Slightly point your toes inward on your back foot.  Keep your right / left heel on the floor, bend your back knee, and slightly shift your weight over the back leg so that you feel a gentle stretch deep in your back calf.  Hold this position for 10 seconds. Repeat 3 times. Complete this stretch 2 times per day.  STRETCH  Gastrocsoleus, Standing  Note: This exercise can place a lot of stress on your foot and ankle. Please complete this exercise only if specifically instructed by your caregiver.   Place the ball of your right / left foot on a step, keeping your other foot firmly on the same step.  Hold on to the wall or a rail for balance.  Slowly lift your other foot, allowing your body weight to press your heel down over the edge of the step.  You should feel a stretch in your right / left calf.  Hold this position for 10 seconds.  Repeat this exercise with a slight bend in your right /   left knee. Repeat 3 times. Complete this stretch 2 times per day.   STRENGTHENING EXERCISES - Plantar Fasciitis (Heel Spur Syndrome)  These exercises may help you when beginning to rehabilitate your injury. They may resolve your symptoms with or without further involvement from your physician, physical therapist or athletic trainer. While completing these exercises, remember:   Muscles can gain both the endurance and the strength needed for everyday activities through controlled exercises.  Complete these exercises as instructed by your physician, physical therapist or athletic trainer. Progress the resistance and repetitions only as guided.  STRENGTH - Towel Curls  Sit in a chair positioned on a non-carpeted surface.  Place your foot on a towel, keeping your heel  on the floor.  Pull the towel toward your heel by only curling your toes. Keep your heel on the floor. Repeat 3 times. Complete this exercise 2 times per day.  STRENGTH - Ankle Inversion  Secure one end of a rubber exercise band/tubing to a fixed object (table, pole). Loop the other end around your foot just before your toes.  Place your fists between your knees. This will focus your strengthening at your ankle.  Slowly, pull your big toe up and in, making sure the band/tubing is positioned to resist the entire motion.  Hold this position for 10 seconds.  Have your muscles resist the band/tubing as it slowly pulls your foot back to the starting position. Repeat 3 times. Complete this exercises 2 times per day.  Document Released: 12/31/2004 Document Revised: 03/25/2011 Document Reviewed: 04/14/2008 [employer] Patient Information 2014 [employer] Achilles Tendinitis  with Rehab Achilles tendinitis is a disorder of the Achilles tendon. The Achilles tendon connects the large calf muscles (Gastrocnemius and Soleus) to the heel bone (calcaneus). This tendon is sometimes called the heel cord. It is important for pushing-off and standing on your toes and is important for walking, running, or jumping. Tendinitis is often caused by overuse and repetitive microtrauma. SYMPTOMS  Pain, tenderness, swelling, warmth, and redness may occur over the Achilles tendon even at rest.  Pain with pushing off, or flexing or extending the ankle.  Pain that is worsened after or during activity. CAUSES   Overuse sometimes seen with rapid increase in exercise programs or in sports requiring running and jumping.  Poor physical conditioning (strength and flexibility or endurance).  Running sports, especially training running down hills.  Inadequate warm-up before practice or play or failure to stretch before participation.  Injury to the tendon. PREVENTION   Warm up and stretch before practice or  competition.  Allow time for adequate rest and recovery between practices and competition.  Keep up conditioning.  Keep up ankle and leg flexibility.  Improve or keep muscle strength and endurance.  Improve cardiovascular fitness.  Use proper technique.  Use proper equipment (shoes, skates).  To help prevent recurrence, taping, protective strapping, or an adhesive bandage may be recommended for several weeks after healing is complete. PROGNOSIS   Recovery may take weeks to several months to heal.  Longer recovery is expected if symptoms have been prolonged.  Recovery is usually quicker if the inflammation is due to a direct blow as compared with overuse or sudden strain. RELATED COMPLICATIONS   Healing time will be prolonged if the condition is not correctly treated. The injury must be given plenty of time to heal.  Symptoms can reoccur if activity is resumed too soon.  Untreated, tendinitis may increase the risk of tendon rupture requiring additional time for recovery   and possibly surgery. TREATMENT   The first treatment consists of rest anti-inflammatory medication, and ice to relieve the pain.  Stretching and strengthening exercises after resolution of pain will likely help reduce the risk of recurrence. Referral to a physical therapist or athletic trainer for further evaluation and treatment may be helpful.  A walking boot or cast may be recommended to rest the Achilles tendon. This can help break the cycle of inflammation and microtrauma.  Arch supports (orthotics) may be prescribed or recommended by your caregiver as an adjunct to therapy and rest.  Surgery to remove the inflamed tendon lining or degenerated tendon tissue is rarely necessary and has shown less than predictable results. MEDICATION   Nonsteroidal anti-inflammatory medications, such as aspirin and ibuprofen, may be used for pain and inflammation relief. Do not take within 7 days before surgery. Take  these as directed by your caregiver. Contact your caregiver immediately if any bleeding, stomach upset, or signs of allergic reaction occur. Other minor pain relievers, such as acetaminophen, may also be used.  Pain relievers may be prescribed as necessary by your caregiver. Do not take prescription pain medication for longer than 4 to 7 days. Use only as directed and only as much as you need.  Cortisone injections are rarely indicated. Cortisone injections may weaken tendons and predispose to rupture. It is better to give the condition more time to heal than to use them. HEAT AND COLD  Cold is used to relieve pain and reduce inflammation for acute and chronic Achilles tendinitis. Cold should be applied for 10 to 15 minutes every 2 to 3 hours for inflammation and pain and immediately after any activity that aggravates your symptoms. Use ice packs or an ice massage.  Heat may be used before performing stretching and strengthening activities prescribed by your caregiver. Use a heat pack or a warm soak. SEEK MEDICAL CARE IF:  Symptoms get worse or do not improve in 2 weeks despite treatment.  New, unexplained symptoms develop. Drugs used in treatment may produce side effects.  EXERCISES:  RANGE OF MOTION (ROM) AND STRETCHING EXERCISES - Achilles Tendinitis  These exercises may help you when beginning to rehabilitate your injury. Your symptoms may resolve with or without further involvement from your physician, physical therapist or athletic trainer. While completing these exercises, remember:   Restoring tissue flexibility helps normal motion to return to the joints. This allows healthier, less painful movement and activity.  An effective stretch should be held for at least 30 seconds.  A stretch should never be painful. You should only feel a gentle lengthening or release in the stretched tissue.  STRETCH  Gastroc, Standing   Place hands on wall.  Extend right / left leg, keeping the  front knee somewhat bent.  Slightly point your toes inward on your back foot.  Keeping your right / left heel on the floor and your knee straight, shift your weight toward the wall, not allowing your back to arch.  You should feel a gentle stretch in the right / left calf. Hold this position for 10 seconds. Repeat 3 times. Complete this stretch 2 times per day.  STRETCH  Soleus, Standing   Place hands on wall.  Extend right / left leg, keeping the other knee somewhat bent.  Slightly point your toes inward on your back foot.  Keep your right / left heel on the floor, bend your back knee, and slightly shift your weight over the back leg so that you feel a   gentle stretch deep in your back calf.  Hold this position for 10 seconds. Repeat 3 times. Complete this stretch 2 times per day.  STRETCH  Gastrocsoleus, Standing  Note: This exercise can place a lot of stress on your foot and ankle. Please complete this exercise only if specifically instructed by your caregiver.   Place the ball of your right / left foot on a step, keeping your other foot firmly on the same step.  Hold on to the wall or a rail for balance.  Slowly lift your other foot, allowing your body weight to press your heel down over the edge of the step.  You should feel a stretch in your right / left calf.  Hold this position for 10 seconds.  Repeat this exercise with a slight bend in your knee. Repeat 3 times. Complete this stretch 2 times per day.   STRENGTHENING EXERCISES - Achilles Tendinitis These exercises may help you when beginning to rehabilitate your injury. They may resolve your symptoms with or without further involvement from your physician, physical therapist or athletic trainer. While completing these exercises, remember:   Muscles can gain both the endurance and the strength needed for everyday activities through controlled exercises.  Complete these exercises as instructed by your physician,  physical therapist or athletic trainer. Progress the resistance and repetitions only as guided.  You may experience muscle soreness or fatigue, but the pain or discomfort you are trying to eliminate should never worsen during these exercises. If this pain does worsen, stop and make certain you are following the directions exactly. If the pain is still present after adjustments, discontinue the exercise until you can discuss the trouble with your clinician.  STRENGTH - Plantar-flexors   Sit with your right / left leg extended. Holding onto both ends of a rubber exercise band/tubing, loop it around the ball of your foot. Keep a slight tension in the band.  Slowly push your toes away from you, pointing them downward.  Hold this position for 10 seconds. Return slowly, controlling the tension in the band/tubing. Repeat 3 times. Complete this exercise 2 times per day.   STRENGTH - Plantar-flexors   Stand with your feet shoulder width apart. Steady yourself with a wall or table using as little support as needed.  Keeping your weight evenly spread over the width of your feet, rise up on your toes.*  Hold this position for 10 seconds. Repeat 3 times. Complete this exercise 2 times per day.  *If this is too easy, shift your weight toward your right / left leg until you feel challenged. Ultimately, you may be asked to do this exercise with your right / left foot only.  STRENGTH  Plantar-flexors, Eccentric  Note: This exercise can place a lot of stress on your foot and ankle. Please complete this exercise only if specifically instructed by your caregiver.   Place the balls of your feet on a step. With your hands, use only enough support from a wall or rail to keep your balance.  Keep your knees straight and rise up on your toes.  Slowly shift your weight entirely to your right / left toes and pick up your opposite foot. Gently and with controlled movement, lower your weight through your right /  left foot so that your heel drops below the level of the step. You will feel a slight stretch in the back of your calf at the end position.  Use the healthy leg to help rise up onto   the balls of both feet, then lower weight only on the right / left leg again. Build up to 15 repetitions. Then progress to 3 consecutive sets of 15 repetitions.*  After completing the above exercise, complete the same exercise with a slight knee bend (about 30 degrees). Again, build up to 15 repetitions. Then progress to 3 consecutive sets of 15 repetitions.* Perform this exercise 2 times per day.  *When you easily complete 3 sets of 15, your physician, physical therapist or athletic trainer may advise you to add resistance by wearing a backpack filled with additional weight.  STRENGTH - Plantar Flexors, Seated   Sit on a chair that allows your feet to rest flat on the ground. If necessary, sit at the edge of the chair.  Keeping your toes firmly on the ground, lift your right / left heel as far as you can without increasing any discomfort in your ankle. Repeat 3 times. Complete this exercise 2 times a day.  

## 2015-05-08 NOTE — Progress Notes (Signed)
   Subjective:    Patient ID: Autumn Valenzuela, female    DOB: 04-04-1960, 55 y.o.   MRN: [id number]  HPI Chief Complaint  Patient presents with  . Foot Pain    Bilateral; Left foot-arch & heel; Right foot-arch, heel & back of heel; pt stated, "On Right foot; pain radiates up leg and is numbing side of foot"     Review of Systems  Constitutional: Positive for diaphoresis.  Eyes: Positive for visual disturbance.  Musculoskeletal: Positive for myalgias.  Neurological: Positive for numbness.  All other systems reviewed and are negative.      Objective:   Physical Exam        Assessment & Plan:

## 2015-05-10 NOTE — Progress Notes (Signed)
Subjective:     Patient ID: Autumn Valenzuela, female   DOB: 12/09/1960, 55 y.o.   MRN: [id number]  HPI patient presents stating that she's had pain in the bottom of her heel right and left localized in nature with mild discomfort in the arch and posterior pain. It is localized and it is tender when pressed. Does not remember specific injury   Review of Systems  All other systems reviewed and are negative.      Objective:   Physical Exam  Constitutional: She is oriented to person, place, and time.  Cardiovascular: Intact distal pulses.   Musculoskeletal: Normal range of motion.  Neurological: She is oriented to person, place, and time.  Skin: Skin is warm.  Nursing note and vitals reviewed.  neurovascular status intact muscle strength adequate range of motion within normal limits with patient found to have discomfort in the plantar heel region bilateral with inflammation fluid around the medial band. Patient's found to have somewhat depression of the arch is found to have good digital perfusion and is well oriented 3     Assessment:     Acute plantar fasciitis bilateral with inflammation with mild arch pain also noted    Plan:     H&P and conditions reviewed with patient. I went ahead today and I injected the plantar fascia bilateral 3 mg Kenalog 5 mg Xylocaine and applied fascial brace bilateral with instructions on usage along with physical therapy and reappoint to recheck  X-ray report indicated that there is small spur formation with no indications of stress fracture arthritis

## 2015-05-22 ENCOUNTER — Ambulatory Visit (INDEPENDENT_AMBULATORY_CARE_PROVIDER_SITE_OTHER): Payer: Medicare PPO | Admitting: Podiatry

## 2015-05-22 ENCOUNTER — Encounter: Payer: Self-pay | Admitting: Podiatry

## 2015-05-22 DIAGNOSIS — M722 Plantar fascial fibromatosis: Secondary | ICD-10-CM | POA: Diagnosis not present

## 2015-05-22 DIAGNOSIS — M7661 Achilles tendinitis, right leg: Secondary | ICD-10-CM

## 2015-05-22 MED ORDER — TRIAMCINOLONE ACETONIDE 10 MG/ML IJ SUSP
10.0000 mg | Freq: Once | INTRAMUSCULAR | Status: AC
  Administered 2015-05-22: 10 mg

## 2015-05-22 NOTE — Patient Instructions (Signed)

## 2015-05-23 NOTE — Progress Notes (Signed)
Subjective:     Patient ID: Autumn Valenzuela, female   DOB: [birth year], 55 y.o.   MRN: [id number]  HPI patient states I'm still having pain in my right foot which has improved some but is still sore when palpated   Review of Systems     Objective:   Physical Exam Neurovascular status intact with continued discomfort plantar aspect right heel at the insertional point of the tendon into the calcaneus    Assessment:     Plantar fasciitis right with inflammation and fluid buildup noted    Plan:     H&P condition reviewed and today I reinjected the plantar fascia right 3 mg Kenalog 5 mg Xylocaine discussed physical therapy and reappoint to recheck again in 1 month or earlier if needed

## 2015-07-24 ENCOUNTER — Emergency Department (HOSPITAL_COMMUNITY): Payer: Medicare PPO

## 2015-07-24 ENCOUNTER — Emergency Department (HOSPITAL_COMMUNITY)
Admission: EM | Admit: 2015-07-24 | Discharge: 2015-07-24 | Disposition: A | Discharge status: Home / Self Care | Payer: Medicare PPO | Attending: Emergency Medicine | Admitting: Emergency Medicine

## 2015-07-24 ENCOUNTER — Encounter (HOSPITAL_COMMUNITY): Payer: Self-pay | Admitting: Emergency Medicine

## 2015-07-24 DIAGNOSIS — Z79899 Other long term (current) drug therapy: Secondary | ICD-10-CM | POA: Insufficient documentation

## 2015-07-24 DIAGNOSIS — Z8673 Personal history of transient ischemic attack (TIA), and cerebral infarction without residual deficits: Secondary | ICD-10-CM | POA: Diagnosis not present

## 2015-07-24 DIAGNOSIS — I10 Essential (primary) hypertension: Secondary | ICD-10-CM | POA: Insufficient documentation

## 2015-07-24 DIAGNOSIS — M25551 Pain in right hip: Secondary | ICD-10-CM | POA: Diagnosis present

## 2015-07-24 NOTE — ED Notes (Signed)
[place] [address] a few days ago on [place].  Landed on right hip.  Noticed pain in hip yesterday worse sitting and lying.  Reports noticing a limp as well.

## 2015-07-24 NOTE — ED Provider Notes (Signed)
CSN: [id number]     Arrival date & time 07/24/15  [id number] History   First MD Initiated Contact with Patient 07/24/15 0557     Chief Complaint  Patient presents with  . Hip Pain     (Consider location/radiation/quality/duration/timing/severity/associated sxs/prior Treatment) HPI Comments: Patient presents to the ED with a chief complaint of right hip pain.  She states that she slipped and fell [place] the gun range 2 days ago.  She landed on her right hip.  She states that she felt fine that day, but yesterday and today she has noticed some increased soreness on her right lateral hip and buttock.  She reports ambulating with a mild limp.  She has not tried taking anything for the symptoms.  The pain is worsened with palpation and movement.  There are not other associated symptoms.  The history is provided by the patient. No language interpreter was used.    Past Medical History  Diagnosis Date  . CVA (cerebral infarction)   . Depression   . Hypertension   . Bipolar disorder ([place])   . Migraine   . High cholesterol    Past Surgical History  Procedure Laterality Date  . Back surgery    . Partial hysterectomy     Family History  Problem Relation Age of Onset  . Stroke Father   . Cancer Paternal Aunt   . Cancer Paternal Aunt   . Diabetes Mother   . Diabetes Brother    Social History  Substance Use Topics  . Smoking status: Never Smoker   . Smokeless tobacco: Never Used  . Alcohol Use: Yes   OB History    No data available     Review of Systems  Musculoskeletal: Positive for arthralgias.  All other systems reviewed and are negative.     Allergies  Cefazolin  Home Medications   Prior to Admission medications   Medication Sig Start Date End Date Taking? Authorizing Provider  albuterol (PROVENTIL HFA;VENTOLIN HFA) 108 (90 BASE) MCG/ACT inhaler Inhale 2 puffs into the lungs daily as needed for wheezing or shortness of breath.    Historical Provider, MD  albuterol  (PROVENTIL) (2.5 MG/3ML) 0.083% nebulizer solution Take 2.5 mg by nebulization at bedtime as needed for wheezing.    Historical Provider, MD  ALPRAZolam Duanne Moron) 0.5 MG tablet Take 0.5 mg by mouth 2 (two) times daily as needed for anxiety.    Historical Provider, MD  amitriptyline (ELAVIL) 25 MG tablet Take 2 tablets (50 mg total) by mouth at bedtime. 09/21/14   Garvin Fila, MD  amLODipine-valsartan (EXFORGE) 5-320 MG per tablet Take 1 tablet by mouth daily.     Historical Provider, MD  ARIPiprazole (ABILIFY) 5 MG tablet Take 5 mg by mouth daily.  08/25/13   Historical Provider, MD  calcium-vitamin D (OSCAL WITH D) 500-200 MG-UNIT per tablet Take 1 tablet by mouth daily with breakfast.    Historical Provider, MD  cyclobenzaprine (FLEXERIL) 10 MG tablet Take 10 mg by mouth as needed for muscle spasms.    Historical Provider, MD  diclofenac (VOLTAREN) 75 MG EC tablet Take 1 tablet (75 mg total) by mouth 2 (two) times daily. 05/08/15   Wallene Huh, DPM  DULoxetine (CYMBALTA) 60 MG capsule Take 60 mg by mouth at bedtime.     Historical Provider, MD  HYDROcodone-acetaminophen (NORCO) 5-325 MG per tablet Take 1 tablet by mouth every 8 (eight) hours as needed for pain. 08/18/12   Marissa Sciacca, PA-C  meloxicam (MOBIC)  7.5 MG tablet Take 1 tablet (7.5 mg total) by mouth daily. 04/25/15   Fransico Meadow, PA-C  topiramate (TOPAMAX) 50 MG tablet Take 1 tablet (50 mg total) by mouth 2 (two) times daily. 03/10/13   Garvin Fila, MD  traMADol (ULTRAM) 50 MG tablet Take 50 mg by mouth 2 (two) times daily as needed (for pain).  08/16/13   Historical Provider, MD   BP 106/77 mmHg  Pulse 73  Temp(Src) 98 F (36.7 C) (Oral)  Resp 18  Ht 5\' 2"  (1.575 m)  Wt 99.791 kg  BMI 40.23 kg/m2  SpO2 96% Physical Exam Physical Exam  Constitutional: Pt appears well-developed and well-nourished. No distress.  HENT:  Head: Normocephalic and atraumatic.  Eyes: Conjunctivae are normal.  Neck: Normal range of motion.   Cardiovascular: Normal rate, regular rhythm and intact distal pulses.   Capillary refill < 3 sec  Pulmonary/Chest: Effort normal and breath sounds normal.  Musculoskeletal: Pt exhibits tenderness. Pt exhibits no edema.  ROM: 5/5  Neurological: Pt  is alert. Coordination normal.  Sensation 5/5 Strength 5/5  Skin: Skin is warm and dry. Pt is not diaphoretic.  No tenting of the skin  No contusions Psychiatric: Pt has a normal mood and affect.  Nursing note and vitals reviewed.  ED Course  Procedures (including critical care time) Labs Review Labs Reviewed - No data to display  Imaging Review Dg Hip Unilat With Pelvis 2-3 Views Right  07/24/2015  CLINICAL DATA:  Right hip pain after fall. EXAM: DG HIP (WITH OR WITHOUT PELVIS) 2-3V RIGHT COMPARISON:  None. FINDINGS: Mild degenerative changes in both hips. No evidence of acute fracture or dislocation in the right hip or in the pelvis. No focal bone lesion or bone destruction. Postoperative changes in the lower lumbar spine. SI joints and symphysis pubis are not displaced. IMPRESSION: No acute bony abnormalities.  Degenerative changes in the hips. Electronically Signed   By: Lucienne Capers M.D.   On: 07/24/2015 06:27   I have personally reviewed and evaluated these image results as part of my medical decision-making.    MDM   Final diagnoses:  Hip pain, right    Patient with right hip pain after having mechanical fall 2 days ago.  Ambulates with antalgic gait.  No bony abnormality or deformity.  No obvious contusion or wound.  RICE therapy.  PCP follow-up.    Montine Circle, PA-C 07/24/15 [place], DO 07/24/15 [id number]

## 2015-07-24 NOTE — Discharge Instructions (Signed)
Joint Pain Joint pain, which is also called arthralgia, can be caused by many things. Joint pain often goes away when you follow your health care provider's instructions for relieving pain at home. However, joint pain can also be caused by conditions that require further treatment. Common causes of joint pain include:  Bruising in the area of the joint.  Overuse of the joint.  Wear and tear on the joints that occur with aging (osteoarthritis).  Various other forms of arthritis.  A buildup of a crystal form of uric acid in the joint (gout).  Infections of the joint (septic arthritis) or of the bone (osteomyelitis). Your health care provider may recommend medicine to help with the pain. If your joint pain continues, additional tests may be needed to diagnose your condition. HOME CARE INSTRUCTIONS Watch your condition for any changes. Follow these instructions as directed to lessen the pain that you are feeling.  Take medicines only as directed by your health care provider.  Rest the affected area for as long as your health care provider says that you should. If directed to do so, raise the painful joint above the level of your heart while you are sitting or lying down.  Do not do things that cause or worsen pain.  If directed, apply ice to the painful area:  Put ice in a plastic bag.  Place a towel between your skin and the bag.  Leave the ice on for 20 minutes, 2-3 times per day.  Wear an elastic bandage, splint, or sling as directed by your health care provider. Loosen the elastic bandage or splint if your fingers or toes become numb and tingle, or if they turn cold and blue.  Begin exercising or stretching the affected area as directed by your health care provider. Ask your health care provider what types of exercise are safe for you.  Keep all follow-up visits as directed by your health care provider. This is important. SEEK MEDICAL CARE IF:  Your pain increases, and medicine  does not help.  Your joint pain does not improve within 3 days.  You have increased bruising or swelling.  You have a fever.  You lose 10 lb (4.5 kg) or more without trying. SEEK IMMEDIATE MEDICAL CARE IF:  You are not able to move the joint.  Your fingers or toes become numb or they turn cold and blue.   This information is not intended to replace advice given to you by your health care provider. Make sure you discuss any questions you have with your health care provider.   Document Released: 12/31/2004 Document Revised: 01/21/2014 Document Reviewed: 10/12/2013 Elsevier Interactive Patient Education 2016 [employer] Iliac Crest Contusion An iliac crest contusion is a deep bruise of your hip bone (hip pointer). Contusions are the result of an injury that caused bleeding under the skin. The contusion may turn blue, purple, or yellow. Minor injuries will give you a painless contusion, but more severe contusions may stay painful and swollen for a few weeks.  CAUSES  An iliac crest contusion is usually caused by a blow to the top of your hip pointer. The injury most often comes from contact sports.  SYMPTOMS   Swelling and redness of the hip area.  Bruising of the hip area.  Tenderness or soreness of the hip. DIAGNOSIS  The diagnosis can be made by taking your history and doing a physical exam. You may need an X-ray of your hip pointer to look for a broken bone (  fracture). TREATMENT  Often, the best treatment for an iliac crest contusion is resting, icing, elevating, and applying cold compresses to the injured area. Over-the-counter medicines may also be recommended for pain control. Crutches may be used if walking is very painful. Some people need physical therapy to help with range of motion and strengthening.  HOME CARE INSTRUCTIONS   Put ice on the injured area.  Put ice in a plastic bag.  Place a towel between your skin and the bag.  Leave the ice on for 15-20 minutes,  03-04 times a day.  Only take over-the-counter or prescription medicines for pain, discomfort, or fever as directed by your caregiver. Your caregiver may recommend avoiding anti-inflammatory medicines (aspirin, ibuprofen, and naproxen) for 48 hours because these medicines may increase bruising.  Keep your leg straightened (extended) when possible.  Walk or move around as the pain allows, or as directed by your caregiver. Use crutches if your caregiver recommends them.  Apply compression wraps as directed by your caregiver. You may remove the wraps for sleeping, showers, and baths. SEEK IMMEDIATE MEDICAL CARE IF:   You have increased bruising or swelling.  You have pain that is getting worse.  Your swelling or pain is not relieved with medicines.  Your toes get cold. MAKE SURE YOU:   Understand these instructions.  Will watch your condition.  Will get help right away if you are not doing well or get worse.   This information is not intended to replace advice given to you by your health care provider. Make sure you discuss any questions you have with your health care provider.   Document Released: 09/25/2000 Document Revised: 03/25/2011 Document Reviewed: 05/18/2014 Elsevier Interactive Patient Education [employer].

## 2016-01-13 ENCOUNTER — Emergency Department (HOSPITAL_COMMUNITY)
Admission: EM | Admit: 2016-01-13 | Discharge: 2016-01-13 | Disposition: A | Discharge status: Home / Self Care | Payer: Medicare PPO | Attending: Emergency Medicine | Admitting: Emergency Medicine

## 2016-01-13 ENCOUNTER — Emergency Department (HOSPITAL_COMMUNITY): Payer: Medicare PPO

## 2016-01-13 ENCOUNTER — Encounter (HOSPITAL_COMMUNITY): Payer: Self-pay

## 2016-01-13 DIAGNOSIS — R739 Hyperglycemia, unspecified: Secondary | ICD-10-CM | POA: Diagnosis not present

## 2016-01-13 DIAGNOSIS — Z79899 Other long term (current) drug therapy: Secondary | ICD-10-CM | POA: Insufficient documentation

## 2016-01-13 DIAGNOSIS — Z87891 Personal history of nicotine dependence: Secondary | ICD-10-CM | POA: Insufficient documentation

## 2016-01-13 DIAGNOSIS — R197 Diarrhea, unspecified: Secondary | ICD-10-CM | POA: Diagnosis not present

## 2016-01-13 DIAGNOSIS — I10 Essential (primary) hypertension: Secondary | ICD-10-CM | POA: Insufficient documentation

## 2016-01-13 DIAGNOSIS — Z8673 Personal history of transient ischemic attack (TIA), and cerebral infarction without residual deficits: Secondary | ICD-10-CM | POA: Insufficient documentation

## 2016-01-13 DIAGNOSIS — J209 Acute bronchitis, unspecified: Secondary | ICD-10-CM | POA: Diagnosis not present

## 2016-01-13 DIAGNOSIS — J069 Acute upper respiratory infection, unspecified: Secondary | ICD-10-CM | POA: Diagnosis present

## 2016-01-13 LAB — CBC WITH DIFFERENTIAL/PLATELET
BASOS ABS: 0 10*3/uL (ref 0.0–0.1)
BASOS PCT: 0 %
EOS ABS: 0.3 10*3/uL (ref 0.0–0.7)
Eosinophils Relative: 3 %
HEMATOCRIT: 39.3 % (ref 36.0–46.0)
Hemoglobin: 12.7 g/dL (ref 12.0–15.0)
Lymphocytes Relative: 34 %
Lymphs Abs: 3.9 10*3/uL (ref 0.7–4.0)
MCH: 26.8 pg (ref 26.0–34.0)
MCHC: 32.3 g/dL (ref 30.0–36.0)
MCV: 83.1 fL (ref 78.0–100.0)
MONO ABS: 0.6 10*3/uL (ref 0.1–1.0)
Monocytes Relative: 5 %
NEUTROS ABS: 6.8 10*3/uL (ref 1.7–7.7)
NEUTROS PCT: 58 %
Platelets: 257 10*3/uL (ref 150–400)
RBC: 4.73 MIL/uL (ref 3.87–5.11)
RDW: 15.2 % (ref 11.5–15.5)
WBC: 11.6 10*3/uL — ABNORMAL HIGH (ref 4.0–10.5)

## 2016-01-13 LAB — COMPREHENSIVE METABOLIC PANEL
ALK PHOS: 66 U/L (ref 38–126)
ALT: 16 U/L (ref 14–54)
ANION GAP: 10 (ref 5–15)
AST: 17 U/L (ref 15–41)
Albumin: 3.5 g/dL (ref 3.5–5.0)
BILIRUBIN TOTAL: 0.4 mg/dL (ref 0.3–1.2)
BUN: 10 mg/dL (ref 6–20)
CALCIUM: 9.1 mg/dL (ref 8.9–10.3)
CO2: 23 mmol/L (ref 22–32)
CREATININE: 0.67 mg/dL (ref 0.44–1.00)
Chloride: 105 mmol/L (ref 101–111)
GFR calc non Af Amer: >60 mL/min (ref >60)
GLUCOSE: 194 mg/dL — AB (ref 65–99)
Potassium: 4.3 mmol/L (ref 3.5–5.1)
Sodium: 138 mmol/L (ref 135–145)
TOTAL PROTEIN: 7 g/dL (ref 6.5–8.1)

## 2016-01-13 MED ORDER — AZITHROMYCIN 250 MG PO TABS: ORAL_TABLET | ORAL | 0 refills | Status: DC

## 2016-01-13 MED ORDER — IPRATROPIUM-ALBUTEROL 0.5-2.5 (3) MG/3ML IN SOLN
3.0000 mL | Freq: Once | RESPIRATORY_TRACT | Status: AC
  Administered 2016-01-13: 3 mL via RESPIRATORY_TRACT
  Filled 2016-01-13: qty 3

## 2016-01-13 NOTE — ED Provider Notes (Signed)
[facility] DEPT Provider Note   CSN: [id number] Arrival date & time: 01/13/16  0241     History   Chief Complaint Chief Complaint  Patient presents with  . URI  . Diarrhea    HPI Autumn Valenzuela is a 55 y.o. female who presents emergency Department with chief complaint of symptoms of URI. She states that her symptoms began 3 weeks ago. She developed a cough which seemed to improve but then about 5 days ago worsened. She has worse symptoms of cough when sleeping, associated wheezing and has been using her inhaler and neb treatments more frequently. 2 days ago she developed watery diarrhea which. She's had 3 episodes over the past 3 days of watery stools. She denies abdominal pain, nausea, vomiting, chills, fevers. She is noted to have an elevated blood sugar, however, patient states she does not have a previous diagnosis of diabetes.  HPI  Past Medical History:  Diagnosis Date  . Bipolar disorder ([place])   . CVA (cerebral infarction)   . Depression   . High cholesterol   . Hypertension   . Migraine     Patient Active Problem List   Diagnosis Date Noted  . Chronic daily headache 11/23/2012  . 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 Depression 11/23/2012    Past Surgical History:  Procedure Laterality Date  . BACK SURGERY    . PARTIAL HYSTERECTOMY      OB History    No data available       Home Medications    Prior to Admission medications   Medication Sig Start Date End Date Taking? Authorizing Provider  albuterol (PROVENTIL HFA;VENTOLIN HFA) 108 (90 BASE) MCG/ACT inhaler Inhale 2 puffs into the lungs daily as needed for wheezing or shortness of breath.   Yes Historical Provider, MD  albuterol (PROVENTIL) (2.5 MG/3ML) 0.083% nebulizer solution Take 2.5 mg by nebulization at bedtime as needed for wheezing.   Yes Historical Provider, MD  ALPRAZolam Duanne Moron) 0.5 MG tablet Take 0.5 mg by mouth 2 (two) times daily as needed for anxiety.   Yes  Historical Provider, MD  amitriptyline (ELAVIL) 25 MG tablet Take 2 tablets (50 mg total) by mouth at bedtime. 09/21/14  Yes Garvin Fila, MD  amLODipine-valsartan (EXFORGE) 5-320 MG per tablet Take 1 tablet by mouth daily.    Yes Historical Provider, MD  ARIPiprazole (ABILIFY) 5 MG tablet Take 5 mg by mouth daily.  08/25/13  Yes Historical Provider, MD  calcium-vitamin D (OSCAL WITH D) 500-200 MG-UNIT per tablet Take 1 tablet by mouth daily with breakfast.   Yes Historical Provider, MD  diclofenac (VOLTAREN) 75 MG EC tablet Take 1 tablet (75 mg total) by mouth 2 (two) times daily. 05/08/15  Yes Wallene Huh, DPM  DULoxetine (CYMBALTA) 60 MG capsule Take 60 mg by mouth at bedtime.    Yes Historical Provider, MD  meloxicam (MOBIC) 7.5 MG tablet Take 1 tablet (7.5 mg total) by mouth daily. 04/25/15  Yes Hollace Kinnier Sofia, PA-C  topiramate (TOPAMAX) 50 MG tablet Take 1 tablet (50 mg total) by mouth 2 (two) times daily. 03/10/13  Yes Garvin Fila, MD  azithromycin (ZITHROMAX Z-PAK) 250 MG tablet 2 po day one, then 1 daily x 4 days 01/13/16   Margarita Mail, PA-C  HYDROcodone-acetaminophen (NORCO) 5-325 MG per tablet Take 1 tablet by mouth every 8 (eight) hours as needed for pain. Patient not taking: Reported on 01/13/2016 08/18/12   Jamse Mead, PA-C    Family  History Family History  Problem Relation Age of Onset  . Diabetes Mother   . Stroke Father   . Cancer Paternal Aunt   . Cancer Paternal Aunt   . Diabetes Brother     Social History Social History  Substance Use Topics  . Smoking status: Former Smoker    Quit date: 12/13/2015  . Smokeless tobacco: Never Used  . Alcohol use No     Allergies   Cefazolin   Review of Systems Review of Systems  Ten systems reviewed and are negative for acute change, except as noted in the HPI.   Physical Exam Updated Vital Signs BP 121/82   Pulse 67   Temp 98.3 F (36.8 C) (Oral)   Resp 20   Ht 5\' 1"  (1.549 m)   Wt 99.8 kg   SpO2 99%    BMI 41.57 kg/m   Physical Exam  Constitutional: She is oriented to person, place, and time. She appears well-developed and well-nourished. No distress.  HENT:  Head: Normocephalic and atraumatic.  Eyes: Conjunctivae are normal. No scleral icterus.  Neck: Normal range of motion.  Cardiovascular: Normal rate, regular rhythm and normal heart sounds.  Exam reveals no gallop and no friction rub.   No murmur heard. Pulmonary/Chest: Effort normal. She has wheezes.  Abdominal: Soft. Bowel sounds are normal. She exhibits no distension and no mass. There is no tenderness. There is no guarding.  Neurological: She is alert and oriented to person, place, and time.  Skin: Skin is warm and dry. She is not diaphoretic.  Nursing note and vitals reviewed.    ED Treatments / Results  Labs (all labs ordered are listed, but only abnormal results are displayed) Labs Reviewed  COMPREHENSIVE METABOLIC PANEL - Abnormal; Notable for the following:       Result Value   Glucose, Bld 194 (*)    All other components within normal limits  CBC WITH DIFFERENTIAL/PLATELET - Abnormal; Notable for the following:    WBC 11.6 (*)    All other components within normal limits    EKG  EKG Interpretation None       Radiology Dg Chest 2 View  Result Date: 01/13/2016 CLINICAL DATA:  Productive cough, fever and congestion. EXAM: CHEST  2 VIEW COMPARISON:  11/29/2013 FINDINGS: The cardiomediastinal contours are normal. The lungs are clear. Pulmonary vasculature is normal. No consolidation, pleural effusion, or pneumothorax. No acute osseous abnormalities are seen. IMPRESSION: No acute abnormality. Electronically Signed   By: Jeb Levering M.D.   On: 01/13/2016 04:11    Procedures Procedures (including critical care time)  Medications Ordered in ED Medications  ipratropium-albuterol (DUONEB) 0.5-2.5 (3) MG/3ML nebulizer solution 3 mL (3 mLs Nebulization Given 01/13/16 0700)     Initial Impression /  Assessment and Plan / ED Course  I have reviewed the triage vital signs and the nursing notes.  Pertinent labs & imaging results that were available during my care of the patient were reviewed by me and considered in my medical decision making (see chart for details).  Clinical Course    Pt CXR negative for acute infiltrate. However because she had an interval of improvement followed by worsening.l will treat with azithromycin. Patients symptoms are consistent with URI/ bronchospasm. Pt will be discharged with symptomatic treatment.  Verbalizes understanding and is agreeable with plan. Pt is hemodynamically stable & in NAD prior to dc.   Final Clinical Impressions(s) / ED Diagnoses   Final diagnoses:  Bronchitis, acute, with bronchospasm  Diarrhea,  unspecified type  Hyperglycemia    New Prescriptions New Prescriptions   AZITHROMYCIN (ZITHROMAX Z-PAK) 250 MG TABLET    2 po day one, then 1 daily x 4 days     Margarita Mail, PA-C 01/13/16 [place], DO 01/13/16 2306

## 2016-01-13 NOTE — ED Triage Notes (Addendum)
Pt complains of cold sx x 3 weeks with ear pain, chills and pt states that diarrhea began 3 days ago with 3 occurrences in the last 24 hours. Pt reports taking tylenol and robitussin without relief. Breath sounds clear. VSS. Pt's coworkers have had the same sx.

## 2016-01-13 NOTE — Discharge Instructions (Signed)
You need to follow up with Dr. Jonelle Sidle as soon as possible. Your blood sugar was very high today. While I cannot give you a diagnosis of Diabetes, this finding is concerning for a new diagnosis of Diabetes and you must have your sugar rechecked this coming week.  Use a daytime/ night time cough medicine like DayQuil/ NyQuil. Take immodium if your diarrhea. Please take all of your antibiotics until finished!   You may develop abdominal discomfort or diarrhea from the antibiotic.  You may help offset this with probiotics which you can buy or get in yogurt. Do not eat  or take the probiotics until 2 hours after your antibiotic.

## 2016-03-13 ENCOUNTER — Encounter: Payer: Self-pay | Admitting: Gastroenterology

## 2016-03-22 ENCOUNTER — Telehealth: Payer: Self-pay | Admitting: *Deleted

## 2016-03-22 NOTE — Telephone Encounter (Signed)
Called for PreVisit, left VM requesting return call.

## 2016-03-24 ENCOUNTER — Observation Stay (HOSPITAL_COMMUNITY)
Admission: EM | Admit: 2016-03-24 | Discharge: 2016-03-26 | Disposition: A | Discharge status: Home / Self Care | Payer: Medicare PPO | Attending: Internal Medicine | Admitting: Internal Medicine

## 2016-03-24 ENCOUNTER — Encounter (HOSPITAL_COMMUNITY): Payer: Self-pay | Admitting: Emergency Medicine

## 2016-03-24 DIAGNOSIS — E1169 Type 2 diabetes mellitus with other specified complication: Secondary | ICD-10-CM

## 2016-03-24 DIAGNOSIS — Z87891 Personal history of nicotine dependence: Secondary | ICD-10-CM | POA: Diagnosis not present

## 2016-03-24 DIAGNOSIS — Z79899 Other long term (current) drug therapy: Secondary | ICD-10-CM | POA: Diagnosis not present

## 2016-03-24 DIAGNOSIS — R739 Hyperglycemia, unspecified: Secondary | ICD-10-CM | POA: Diagnosis present

## 2016-03-24 DIAGNOSIS — E1110 Type 2 diabetes mellitus with ketoacidosis without coma: Secondary | ICD-10-CM | POA: Diagnosis not present

## 2016-03-24 DIAGNOSIS — F319 Bipolar disorder, unspecified: Secondary | ICD-10-CM | POA: Insufficient documentation

## 2016-03-24 DIAGNOSIS — Z809 Family history of malignant neoplasm, unspecified: Secondary | ICD-10-CM | POA: Diagnosis not present

## 2016-03-24 DIAGNOSIS — I10 Essential (primary) hypertension: Secondary | ICD-10-CM | POA: Insufficient documentation

## 2016-03-24 DIAGNOSIS — E871 Hypo-osmolality and hyponatremia: Secondary | ICD-10-CM | POA: Diagnosis not present

## 2016-03-24 DIAGNOSIS — B373 Candidiasis of vulva and vagina: Secondary | ICD-10-CM | POA: Insufficient documentation

## 2016-03-24 DIAGNOSIS — Z90710 Acquired absence of both cervix and uterus: Secondary | ICD-10-CM | POA: Insufficient documentation

## 2016-03-24 DIAGNOSIS — E7800 Pure hypercholesterolemia, unspecified: Secondary | ICD-10-CM | POA: Diagnosis not present

## 2016-03-24 DIAGNOSIS — B3731 Acute candidiasis of vulva and vagina: Secondary | ICD-10-CM

## 2016-03-24 DIAGNOSIS — F329 Major depressive disorder, single episode, unspecified: Secondary | ICD-10-CM | POA: Insufficient documentation

## 2016-03-24 DIAGNOSIS — E785 Hyperlipidemia, unspecified: Secondary | ICD-10-CM | POA: Insufficient documentation

## 2016-03-24 DIAGNOSIS — Z8673 Personal history of transient ischemic attack (TIA), and cerebral infarction without residual deficits: Secondary | ICD-10-CM | POA: Diagnosis not present

## 2016-03-24 DIAGNOSIS — Z823 Family history of stroke: Secondary | ICD-10-CM | POA: Diagnosis not present

## 2016-03-24 DIAGNOSIS — E118 Type 2 diabetes mellitus with unspecified complications: Secondary | ICD-10-CM | POA: Diagnosis not present

## 2016-03-24 DIAGNOSIS — L309 Dermatitis, unspecified: Secondary | ICD-10-CM | POA: Diagnosis not present

## 2016-03-24 DIAGNOSIS — E119 Type 2 diabetes mellitus without complications: Secondary | ICD-10-CM

## 2016-03-24 DIAGNOSIS — E1310 Other specified diabetes mellitus with ketoacidosis without coma: Secondary | ICD-10-CM

## 2016-03-24 DIAGNOSIS — R51 Headache: Secondary | ICD-10-CM | POA: Diagnosis not present

## 2016-03-24 DIAGNOSIS — Z794 Long term (current) use of insulin: Secondary | ICD-10-CM | POA: Diagnosis not present

## 2016-03-24 DIAGNOSIS — Z833 Family history of diabetes mellitus: Secondary | ICD-10-CM | POA: Insufficient documentation

## 2016-03-24 DIAGNOSIS — F32A Depression, unspecified: Secondary | ICD-10-CM | POA: Diagnosis present

## 2016-03-24 DIAGNOSIS — Z881 Allergy status to other antibiotic agents status: Secondary | ICD-10-CM | POA: Diagnosis not present

## 2016-03-24 DIAGNOSIS — R519 Headache, unspecified: Secondary | ICD-10-CM | POA: Diagnosis present

## 2016-03-24 LAB — BASIC METABOLIC PANEL
ANION GAP: 14 (ref 5–15)
BUN: 7 mg/dL (ref 6–20)
CALCIUM: 8.8 mg/dL — AB (ref 8.9–10.3)
CO2: 21 mmol/L — ABNORMAL LOW (ref 22–32)
Chloride: 92 mmol/L — ABNORMAL LOW (ref 101–111)
Creatinine, Ser: 0.98 mg/dL (ref 0.44–1.00)
GFR calc Af Amer: >60 mL/min (ref >60)
GFR calc non Af Amer: >60 mL/min (ref >60)
GLUCOSE: 797 mg/dL — AB (ref 65–99)
POTASSIUM: 3.8 mmol/L (ref 3.5–5.1)
Sodium: 127 mmol/L — ABNORMAL LOW (ref 135–145)

## 2016-03-24 LAB — GLUCOSE, CAPILLARY
GLUCOSE-CAPILLARY: 496 mg/dL — AB (ref 65–99)
Glucose-Capillary: 191 mg/dL — ABNORMAL HIGH (ref 65–99)

## 2016-03-24 LAB — CBC
HCT: 39.7 % (ref 36.0–46.0)
HCT: 37.5 % (ref 36.0–46.0)
HEMOGLOBIN: 13.2 g/dL (ref 12.0–15.0)
Hemoglobin: 12.2 g/dL (ref 12.0–15.0)
MCH: 27.9 pg (ref 26.0–34.0)
MCH: 27.1 pg (ref 26.0–34.0)
MCHC: 33.2 g/dL (ref 30.0–36.0)
MCHC: 32.5 g/dL (ref 30.0–36.0)
MCV: 83.9 fL (ref 78.0–100.0)
MCV: 83.1 fL (ref 78.0–100.0)
Platelets: 241 10*3/uL (ref 150–400)
Platelets: 196 10*3/uL (ref 150–400)
RBC: 4.73 MIL/uL (ref 3.87–5.11)
RBC: 4.51 MIL/uL (ref 3.87–5.11)
RDW: 14.4 % (ref 11.5–15.5)
RDW: 14.4 % (ref 11.5–15.5)
WBC: 13.7 10*3/uL — AB (ref 4.0–10.5)
WBC: 12.7 10*3/uL — ABNORMAL HIGH (ref 4.0–10.5)

## 2016-03-24 LAB — URINALYSIS, ROUTINE W REFLEX MICROSCOPIC
Bacteria, UA: NONE SEEN
Bilirubin Urine: NEGATIVE
Glucose, UA: >=500 mg/dL — AB
HGB URINE DIPSTICK: NEGATIVE
KETONES UR: 5 mg/dL — AB
LEUKOCYTES UA: NEGATIVE
Nitrite: NEGATIVE
PH: 6 (ref 5.0–8.0)
Protein, ur: NEGATIVE mg/dL
SPECIFIC GRAVITY, URINE: 1.03 (ref 1.005–1.030)
Squamous Epithelial / LPF: NONE SEEN
WBC UA: NONE SEEN WBC/hpf (ref 0–5)

## 2016-03-24 LAB — CBG MONITORING, ED: GLUCOSE-CAPILLARY: 385 mg/dL — AB (ref 65–99)

## 2016-03-24 LAB — SEDIMENTATION RATE: SED RATE: 24 mm/h — AB (ref 0–22)

## 2016-03-24 LAB — T4, FREE: FREE T4: 1.08 ng/dL (ref 0.61–1.12)

## 2016-03-24 LAB — CREATININE, SERUM
Creatinine, Ser: 0.7 mg/dL (ref 0.44–1.00)
GFR calc Af Amer: >60 mL/min (ref >60)
GFR calc non Af Amer: >60 mL/min (ref >60)

## 2016-03-24 LAB — TSH: TSH: 1.036 u[IU]/mL (ref 0.350–4.500)

## 2016-03-24 MED ORDER — INSULIN ASPART 100 UNIT/ML ~~LOC~~ SOLN
0.0000 [IU] | Freq: Three times a day (TID) | SUBCUTANEOUS | Status: DC
  Administered 2016-03-24: 9 [IU] via SUBCUTANEOUS

## 2016-03-24 MED ORDER — HEPARIN SODIUM (PORCINE) 5000 UNIT/ML IJ SOLN
5000.0000 [IU] | Freq: Three times a day (TID) | INTRAMUSCULAR | Status: DC
  Administered 2016-03-24 – 2016-03-26 (×7): 5000 [IU] via SUBCUTANEOUS
  Filled 2016-03-24 (×7): qty 1

## 2016-03-24 MED ORDER — ALBUTEROL SULFATE (2.5 MG/3ML) 0.083% IN NEBU: 2.5000 mg | INHALATION_SOLUTION | Freq: Every evening | RESPIRATORY_TRACT | Status: DC | PRN

## 2016-03-24 MED ORDER — ACETAMINOPHEN 650 MG RE SUPP: 650.0000 mg | Freq: Four times a day (QID) | RECTAL | Status: DC | PRN

## 2016-03-24 MED ORDER — DULOXETINE HCL 60 MG PO CPEP
60.0000 mg | ORAL_CAPSULE | Freq: Every day | ORAL | Status: DC
  Administered 2016-03-24 – 2016-03-25 (×2): 60 mg via ORAL
  Filled 2016-03-24 (×2): qty 1

## 2016-03-24 MED ORDER — ACETAMINOPHEN 325 MG PO TABS: 650.0000 mg | ORAL_TABLET | Freq: Four times a day (QID) | ORAL | Status: DC | PRN

## 2016-03-24 MED ORDER — AMLODIPINE BESYLATE 5 MG PO TABS
5.0000 mg | ORAL_TABLET | Freq: Every day | ORAL | Status: DC
  Administered 2016-03-24 – 2016-03-26 (×3): 5 mg via ORAL
  Filled 2016-03-24 (×3): qty 1

## 2016-03-24 MED ORDER — SODIUM CHLORIDE 0.9 % IV SOLN
INTRAVENOUS | Status: DC
  Filled 2016-03-24: qty 2.5

## 2016-03-24 MED ORDER — FLUCONAZOLE 100 MG PO TABS
150.0000 mg | ORAL_TABLET | Freq: Every day | ORAL | Status: DC
  Administered 2016-03-24 – 2016-03-26 (×3): 150 mg via ORAL
  Filled 2016-03-24 (×4): qty 2

## 2016-03-24 MED ORDER — DEXTROSE-NACL 5-0.45 % IV SOLN: INTRAVENOUS | Status: DC

## 2016-03-24 MED ORDER — ARIPIPRAZOLE 5 MG PO TABS
5.0000 mg | ORAL_TABLET | Freq: Every day | ORAL | Status: DC
  Administered 2016-03-25 – 2016-03-26 (×2): 5 mg via ORAL
  Filled 2016-03-24 (×2): qty 1

## 2016-03-24 MED ORDER — FLUCONAZOLE 100 MG PO TABS
150.0000 mg | ORAL_TABLET | Freq: Once | ORAL | Status: AC
  Administered 2016-03-24: 150 mg via ORAL
  Filled 2016-03-24: qty 2

## 2016-03-24 MED ORDER — ONDANSETRON HCL 4 MG/2ML IJ SOLN
4.0000 mg | Freq: Once | INTRAMUSCULAR | Status: AC
  Administered 2016-03-24: 4 mg via INTRAVENOUS
  Filled 2016-03-24: qty 2

## 2016-03-24 MED ORDER — TRIAMCINOLONE ACETONIDE 0.1 % EX OINT
TOPICAL_OINTMENT | Freq: Three times a day (TID) | CUTANEOUS | Status: DC
  Administered 2016-03-24 – 2016-03-26 (×7): via TOPICAL
  Filled 2016-03-24 (×2): qty 15

## 2016-03-24 MED ORDER — SODIUM CHLORIDE 0.9 % IV SOLN
INTRAVENOUS | Status: DC
  Administered 2016-03-24 – 2016-03-25 (×3): via INTRAVENOUS

## 2016-03-24 MED ORDER — SODIUM CHLORIDE 0.9 % IV BOLUS (SEPSIS)
1000.0000 mL | Freq: Once | INTRAVENOUS | Status: AC
  Administered 2016-03-24: 1000 mL via INTRAVENOUS

## 2016-03-24 MED ORDER — ONDANSETRON HCL 4 MG/2ML IJ SOLN
4.0000 mg | Freq: Four times a day (QID) | INTRAMUSCULAR | Status: DC | PRN
  Administered 2016-03-24: 4 mg via INTRAVENOUS
  Filled 2016-03-24: qty 2

## 2016-03-24 MED ORDER — INSULIN ASPART 100 UNIT/ML ~~LOC~~ SOLN
0.0000 [IU] | Freq: Three times a day (TID) | SUBCUTANEOUS | Status: DC
  Administered 2016-03-24: 15 [IU] via SUBCUTANEOUS
  Administered 2016-03-25: 11 [IU] via SUBCUTANEOUS
  Administered 2016-03-25: 8 [IU] via SUBCUTANEOUS
  Administered 2016-03-25: 15 [IU] via SUBCUTANEOUS
  Administered 2016-03-26 (×2): 5 [IU] via SUBCUTANEOUS

## 2016-03-24 MED ORDER — AMLODIPINE BESYLATE-VALSARTAN 5-320 MG PO TABS: 1.0000 | ORAL_TABLET | Freq: Every day | ORAL | Status: DC

## 2016-03-24 MED ORDER — SODIUM CHLORIDE 0.9% FLUSH
3.0000 mL | Freq: Two times a day (BID) | INTRAVENOUS | Status: DC
  Administered 2016-03-24 – 2016-03-26 (×4): 3 mL via INTRAVENOUS

## 2016-03-24 MED ORDER — TOPIRAMATE 25 MG PO TABS
50.0000 mg | ORAL_TABLET | Freq: Two times a day (BID) | ORAL | Status: DC
  Administered 2016-03-24 – 2016-03-26 (×5): 50 mg via ORAL
  Filled 2016-03-24 (×5): qty 2

## 2016-03-24 MED ORDER — ALPRAZOLAM 0.5 MG PO TABS
0.5000 mg | ORAL_TABLET | Freq: Two times a day (BID) | ORAL | Status: DC | PRN
  Administered 2016-03-24: 0.5 mg via ORAL
  Filled 2016-03-24: qty 1

## 2016-03-24 MED ORDER — CALCIUM CARBONATE-VITAMIN D 500-200 MG-UNIT PO TABS
1.0000 | ORAL_TABLET | Freq: Every day | ORAL | Status: DC
  Administered 2016-03-24 – 2016-03-26 (×3): 1 via ORAL
  Filled 2016-03-24 (×3): qty 1

## 2016-03-24 MED ORDER — ALBUTEROL SULFATE (2.5 MG/3ML) 0.083% IN NEBU: 2.5000 mg | INHALATION_SOLUTION | Freq: Every day | RESPIRATORY_TRACT | Status: DC | PRN

## 2016-03-24 MED ORDER — IRBESARTAN 300 MG PO TABS
300.0000 mg | ORAL_TABLET | Freq: Every day | ORAL | Status: DC
  Administered 2016-03-24 – 2016-03-26 (×3): 300 mg via ORAL
  Filled 2016-03-24 (×3): qty 1

## 2016-03-24 MED ORDER — ZOLPIDEM TARTRATE 5 MG PO TABS: 5.0000 mg | ORAL_TABLET | Freq: Every evening | ORAL | Status: DC | PRN

## 2016-03-24 MED ORDER — ONDANSETRON HCL 4 MG PO TABS
4.0000 mg | ORAL_TABLET | Freq: Four times a day (QID) | ORAL | Status: DC | PRN
Start: 2016-03-24 — End: 2016-03-26

## 2016-03-24 MED ORDER — ALBUTEROL SULFATE HFA 108 (90 BASE) MCG/ACT IN AERS
2.0000 | INHALATION_SPRAY | Freq: Every day | RESPIRATORY_TRACT | Status: DC | PRN
Start: 2016-03-24 — End: 2016-03-24

## 2016-03-24 MED ORDER — MICONAZOLE NITRATE 2 % EX CREA
TOPICAL_CREAM | Freq: Two times a day (BID) | CUTANEOUS | Status: DC
  Administered 2016-03-24 – 2016-03-26 (×5): via TOPICAL
  Filled 2016-03-24 (×2): qty 14

## 2016-03-24 NOTE — Progress Notes (Addendum)
CBG 496 Paged Dr. Janne Napoleon. Above sliding scale.  Dr. Janne Napoleon returned page and gave VO to raise sliding scale to moderate and to give 15U at this time.

## 2016-03-24 NOTE — Progress Notes (Signed)
Autumn Valenzuela [id number] Admission Data: 03/24/2016 6:10 PM Attending Provider: Maren Reamer, MD  F[facility]:[id number],WLSLH, MD Consults/ Treatment Team:   Autumn Valenzuela is a 56 y.o. female patient admitted from ED awake, alert  & orientated  X 3,  Full Code, VSS - Blood pressure 115/71, pulse 80, temperature 99 F (37.2 C), temperature source Oral, resp. rate 20, SpO2 100 %., , no c/o shortness of breath, no c/o chest pain, no distress noted. Tele # 13 placed and pt is currently running:normal sinus rhythm.   IV site WDL:  hand right, condition patent and no redness with a transparent dsg that's clean dry and intact.  Allergies:   Allergies  Allergen Reactions  . Cefazolin Other (See Comments)    Rapid iv infusion caused swelling and burning at the iv site     Past Medical History:  Diagnosis Date  . Bipolar disorder ([place])   . CVA (cerebral infarction)   . Depression   . High cholesterol   . Hypertension   . Migraine     History:  obtained from chart review. Tobacco/alcohol: denied none  Pt orientation to unit, room and routine. Information packet given to patient/family and safety video watched.  Admission INP armband ID verified with patient/family, and in place. SR up x 2, fall risk assessment complete with Patient and family verbalizing understanding of risks associated with falls. Pt verbalizes an understanding of how to use the call bell and to call for help before getting out of bed.  Skin, clean-dry- intact without evidence of bruising, or skin tears.   No evidence of skin break down noted on exam. Back, upper arms, RUQ, and lower legs have scabbing and rash that is painful and itching.     Will cont to monitor and assist as needed.  Salley Slaughter, RN 03/24/2016 6:10 PM

## 2016-03-24 NOTE — ED Notes (Signed)
CBG 385; RN notified

## 2016-03-24 NOTE — ED Triage Notes (Addendum)
Patient had urinary symptoms a few weeks ago, took some AZO and now having yeast in vaginal area and full body rash on her back, upper arms and legs, starting on her shoulders.  She states she is having a hard time taking showers due to her skin hurting when water hits the rash.  She is having pain when she sits due to the vaginitis.  Patient was told that she might be a diabetic.  Patient states she is very thirsty all the time.  Patient states the MD said she might be a diabetic.

## 2016-03-24 NOTE — ED Notes (Signed)
Admitting at bedside 

## 2016-03-24 NOTE — ED Provider Notes (Signed)
[facility] DEPT Provider Note   CSN: [id number] Arrival date & time: 03/24/16  0600     History   Chief Complaint Chief Complaint  Patient presents with  . Hyperglycemia  . Vaginitis    HPI Autumn Valenzuela is a 56 y.o. female.  Patient with urinary symptoms that started a few weeks ago. Also with skin rash and also with a vaginal discharge and irritation vaginal area. Skin rash includes her back up her arms and legs and started on her shoulders. Patient has not wanted to use any lotion because of burns. There's been no blisters as been no significant skin peeling. Does itch some. Patient also having irritation in the vaginal external genitalia area. Patient's been feeling thirsty all the time has been urinating frequently.  Patient denies any abdominal pain chest pain sniffing and shortness of breath for fevers.      Past Medical History:  Diagnosis Date  . Bipolar disorder ([place])   . CVA (cerebral infarction)   . Depression   . High cholesterol   . Hypertension   . Migraine     Patient Active Problem List   Diagnosis Date Noted  . Chronic daily headache 11/23/2012  . 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 Depression 11/23/2012    Past Surgical History:  Procedure Laterality Date  . BACK SURGERY    . PARTIAL HYSTERECTOMY      OB History    No data available       Home Medications    Prior to Admission medications   Medication Sig Start Date End Date Taking? Authorizing Provider  albuterol (PROVENTIL HFA;VENTOLIN HFA) 108 (90 BASE) MCG/ACT inhaler Inhale 2 puffs into the lungs daily as needed for wheezing or shortness of breath.    Historical Provider, MD  albuterol (PROVENTIL) (2.5 MG/3ML) 0.083% nebulizer solution Take 2.5 mg by nebulization at bedtime as needed for wheezing.    Historical Provider, MD  ALPRAZolam Duanne Moron) 0.5 MG tablet Take 0.5 mg by mouth 2 (two) times daily as needed for anxiety.    Historical Provider, MD    amitriptyline (ELAVIL) 25 MG tablet Take 2 tablets (50 mg total) by mouth at bedtime. 09/21/14   Garvin Fila, MD  amLODipine-valsartan (EXFORGE) 5-320 MG per tablet Take 1 tablet by mouth daily.     Historical Provider, MD  ARIPiprazole (ABILIFY) 5 MG tablet Take 5 mg by mouth daily.  08/25/13   Historical Provider, MD  azithromycin (ZITHROMAX Z-PAK) 250 MG tablet 2 po day one, then 1 daily x 4 days 01/13/16   Margarita Mail, PA-C  calcium-vitamin D (OSCAL WITH D) 500-200 MG-UNIT per tablet Take 1 tablet by mouth daily with breakfast.    Historical Provider, MD  diclofenac (VOLTAREN) 75 MG EC tablet Take 1 tablet (75 mg total) by mouth 2 (two) times daily. 05/08/15   Wallene Huh, DPM  DULoxetine (CYMBALTA) 60 MG capsule Take 60 mg by mouth at bedtime.     Historical Provider, MD  HYDROcodone-acetaminophen (NORCO) 5-325 MG per tablet Take 1 tablet by mouth every 8 (eight) hours as needed for pain. Patient not taking: Reported on 01/13/2016 08/18/12   Marissa Sciacca, PA-C  meloxicam (MOBIC) 7.5 MG tablet Take 1 tablet (7.5 mg total) by mouth daily. 04/25/15   Fransico Meadow, PA-C  topiramate (TOPAMAX) 50 MG tablet Take 1 tablet (50 mg total) by mouth 2 (two) times daily. 03/10/13   Garvin Fila, MD    Family History Family  History  Problem Relation Age of Onset  . Diabetes Mother   . Stroke Father   . Cancer Paternal Aunt   . Cancer Paternal Aunt   . Diabetes Brother     Social History Social History  Substance Use Topics  . Smoking status: Former Smoker    Quit date: 12/13/2015  . Smokeless tobacco: Never Used  . Alcohol use No     Allergies   Cefazolin   Review of Systems Review of Systems  Constitutional: Negative for fever.  HENT: Negative for congestion.   Eyes: Negative for redness.  Respiratory: Negative for shortness of breath.   Cardiovascular: Negative for chest pain.  Gastrointestinal: Negative for abdominal pain.  Endocrine: Positive for polydipsia and  polyuria.  Genitourinary: Positive for dysuria and vaginal discharge.  Musculoskeletal: Negative for back pain.  Skin: Positive for rash.  Neurological: Negative for headaches.  Hematological: Does not bruise/bleed easily.  Psychiatric/Behavioral: Negative for confusion.     Physical Exam Updated Vital Signs BP 104/76 (BP Location: Right Arm)   Pulse 78   Temp 98.4 F (36.9 C) (Oral)   Resp 22   SpO2 99%   Physical Exam  Constitutional: She is oriented to person, place, and time. She appears well-developed and well-nourished. No distress.  HENT:  Head: Normocephalic and atraumatic.  Mucous membranes dry.  Eyes: Conjunctivae and EOM are normal. Pupils are equal, round, and reactive to light.  Neck: Normal range of motion. Neck supple.  Cardiovascular: Normal rate, regular rhythm and normal heart sounds.   Pulmonary/Chest: Effort normal and breath sounds normal. No respiratory distress.  Abdominal: Soft. Bowel sounds are normal.  Genitourinary:  Genitourinary Comments: External genitalia with a whitish discharge some redness in the bowl area and upper thighs. Also some skin rash.  Neurological: She is alert and oriented to person, place, and time. No cranial nerve deficit or sensory deficit. She exhibits normal muscle tone. Coordination normal.  Skin: Skin is warm. Rash noted.  Patient with dried skin to lower extremities and arm with some scabbing no vesicles no skin peeling. Also to back area.  Nursing note and vitals reviewed.    ED Treatments / Results  Labs (all labs ordered are listed, but only abnormal results are displayed) Labs Reviewed  BASIC METABOLIC PANEL - Abnormal; Notable for the following:       Result Value   Sodium 127 (*)    Chloride 92 (*)    CO2 21 (*)    Glucose, Bld 797 (*)    Calcium 8.8 (*)    All other components within normal limits  CBC - Abnormal; Notable for the following:    WBC 13.7 (*)    All other components within normal limits    URINALYSIS, ROUTINE W REFLEX MICROSCOPIC - Abnormal; Notable for the following:    Color, Urine COLORLESS (*)    Glucose, UA >=500 (*)    Ketones, ur 5 (*)    All other components within normal limits  CBG MONITORING, ED - Abnormal; Notable for the following:    Glucose-Capillary >600 (*)    All other components within normal limits  CBG MONITORING, ED    EKG  EKG Interpretation None       Radiology No results found.  Procedures Procedures (including critical care time)  CRITICAL CARE Performed by: Cailah Reach Total critical care time: 30 minutes Critical care time was exclusive of separately billable procedures and treating other patients. Critical care was necessary to treat or prevent  imminent or life-threatening deterioration. Critical care was time spent personally by me on the following activities: development of treatment plan with patient and/or surrogate as well as nursing, discussions with consultants, evaluation of patient's response to treatment, examination of patient, obtaining history from patient or surrogate, ordering and performing treatments and interventions, ordering and review of laboratory studies, ordering and review of radiographic studies, pulse oximetry and re-evaluation of patient's condition.   Medications Ordered in ED Medications  0.9 %  sodium chloride infusion (not administered)  insulin regular (NOVOLIN R,HUMULIN R) 250 Units in sodium chloride 0.9 % 250 mL (1 Units/mL) infusion (not administered)  dextrose 5 %-0.45 % sodium chloride infusion (not administered)  sodium chloride 0.9 % bolus 1,000 mL (1,000 mLs Intravenous New Bag/Given 03/24/16 0808)  ondansetron (ZOFRAN) injection 4 mg (4 mg Intravenous Given 03/24/16 0809)  fluconazole (DIFLUCAN) tablet 150 mg (150 mg Oral Given 03/24/16 0809)     Initial Impression / Assessment and Plan / ED Course  I have reviewed the triage vital signs and the nursing notes.  Pertinent labs &  imaging results that were available during my care of the patient were reviewed by me and considered in my medical decision making (see chart for details).    Patient with markedly elevated blood sugars here almost 800. Not consistent with diabetic ketoacidosis.  Patient started on glucose stabilizer for this. An IV fluids.  Patient also with dry scaly skin rash over the last few weeks. And a whitish vaginal discharge and skin irritation down in the bowl area with some redness. Patient given Diflucan here for that. Clinically consistent with yeast infection.  Consult to to the hospitalist team patient followed by Dr. Mcarthur Rossetti they will admit her.   Final Clinical Impressions(s) / ED Diagnoses   Final diagnoses:  Hyperglycemia  Type 2 diabetes mellitus without complication, without long-term current use of insulin (HCC)  Vaginal yeast infection    New Prescriptions New Prescriptions   No medications on file     Fredia Sorrow, MD 03/24/16 [phone]

## 2016-03-24 NOTE — H&P (Signed)
History and Physical    Autumn Valenzuela YHC:[id number] DOB: [birth year] DOA: 03/24/2016  PCP: Barbette Merino, MD  Patient coming from: home  Chief Complaint: Skin burning, dryness; vaginal/vulvar and groin burning pain and itching  HPI: Autumn Valenzuela is a 56 y.o. female with medical history significant CVA, bipolar disorder, HTN, Hyperlipidemia and migraine headaches who presented to the ED with c/o 3-4 week history of severe burning pain and itching of the genital area and redness of the skin with small wet bloody lesions that would be very painful to touch and very itchy, but soon after appearance dried into crusted dry lesions on the erythematous background. Patient has difficulty taking showers and applying lotion d/t exquisite tenderness. Last time she was in the ED with bronchitis in December 2017 and was told that her blood sugar is elevated and she needs to follow up with PCP. Patient contacted PCP, but had never been seen for follow up yet Today she presented to the ED d/t worsening of symptoms, severe burning in the vulvar are making it uncomfortable even to sit down and very painful with urination. She reported taking OTC AZO for yeast infection, but had no relief She denied chest pain, bu c/o SOB onset approximately two weeks ago and mainly exertional and has diarrhea for the last couple of weeks She also has classical polydipsia, increased appetite, but cannot eat d/t burning tongue and has poly uria with urination almost every 15-20 minutes last night  ED Course: On arrival her VS were stable, blood work demonstrated severe hyperglycemia - 797 mg/dL, mildly elevated WBC count - 13,700  UA was negative for infection and positive for ketones and positive for ketones. Patient was placed on Glucomander protocol    Review of Systems: As per HPI otherwise 10 point review of systems negative.   Ambulatory Status: Independent  Past Medical History:  Diagnosis Date  . Bipolar disorder  ([place])   . CVA (cerebral infarction)   . Depression   . High cholesterol   . Hypertension   . Migraine     Past Surgical History:  Procedure Laterality Date  . BACK SURGERY    . PARTIAL HYSTERECTOMY      Social History   Social History  . Marital status: Single    Spouse name: N/A  . Number of children: 3  . Years of education: 12   Occupational History  . Not on file.   Social History Main Topics  . Smoking status: Former Smoker    Quit date: 12/13/2015  . Smokeless tobacco: Never Used  . Alcohol use No  . Drug use: No     Comment: occ - states stopped smokin weed 04/2014  . Sexual activity: Yes    Birth control/ protection: None   Other Topics Concern  . Not on file   Social History Narrative   Patient is single with 3 children.   Patient is right handed.   Patient has a high school education.   Patient drinks 3 cups daily.    Allergies  Allergen Reactions  . Cefazolin Other (See Comments)    Rapid iv infusion caused swelling and burning at the iv site    Family History  Problem Relation Age of Onset  . Diabetes Mother   . Stroke Father   . Cancer Paternal Aunt   . Cancer Paternal Aunt   . Diabetes Brother     Prior to Admission medications   Medication Sig Start Date End Date Taking? Authorizing  Provider  albuterol (PROVENTIL HFA;VENTOLIN HFA) 108 (90 BASE) MCG/ACT inhaler Inhale 2 puffs into the lungs daily as needed for wheezing or shortness of breath.    Historical Provider, MD  albuterol (PROVENTIL) (2.5 MG/3ML) 0.083% nebulizer solution Take 2.5 mg by nebulization at bedtime as needed for wheezing.    Historical Provider, MD  ALPRAZolam Duanne Moron) 0.5 MG tablet Take 0.5 mg by mouth 2 (two) times daily as needed for anxiety.    Historical Provider, MD  amitriptyline (ELAVIL) 25 MG tablet Take 2 tablets (50 mg total) by mouth at bedtime. 09/21/14   Garvin Fila, MD  amLODipine-valsartan (EXFORGE) 5-320 MG per tablet Take 1 tablet by mouth daily.      Historical Provider, MD  ARIPiprazole (ABILIFY) 5 MG tablet Take 5 mg by mouth daily.  08/25/13   Historical Provider, MD  azithromycin (ZITHROMAX Z-PAK) 250 MG tablet 2 po day one, then 1 daily x 4 days 01/13/16   Margarita Mail, PA-C  calcium-vitamin D (OSCAL WITH D) 500-200 MG-UNIT per tablet Take 1 tablet by mouth daily with breakfast.    Historical Provider, MD  diclofenac (VOLTAREN) 75 MG EC tablet Take 1 tablet (75 mg total) by mouth 2 (two) times daily. 05/08/15   Wallene Huh, DPM  DULoxetine (CYMBALTA) 60 MG capsule Take 60 mg by mouth at bedtime.     Historical Provider, MD  HYDROcodone-acetaminophen (NORCO) 5-325 MG per tablet Take 1 tablet by mouth every 8 (eight) hours as needed for pain. Patient not taking: Reported on 01/13/2016 08/18/12   Marissa Sciacca, PA-C  meloxicam (MOBIC) 7.5 MG tablet Take 1 tablet (7.5 mg total) by mouth daily. 04/25/15   Fransico Meadow, PA-C  topiramate (TOPAMAX) 50 MG tablet Take 1 tablet (50 mg total) by mouth 2 (two) times daily. 03/10/13   Garvin Fila, MD    Physical Exam: Vitals:   03/24/16 0704  BP: 104/76  Pulse: 78  Resp: 22  Temp: 98.4 F (36.9 C)  TempSrc: Oral  SpO2: 99%     General: Appears in moderate distress  Eyes: PERRLA, EOMI, normal lids, iris ENT:  grossly normal hearing, lips & tongue, mucous membranes moist and intact Neck: no lymphoadenopathy, masses or thyromegaly Cardiovascular: RRR, no m/r/g. No JVD, carotid bruits. No LE edema.  Respiratory: bilateral no wheezes, rales, rhonchi or cracles. Normal respiratory effort. No accessory muscle use observed Abdomen: soft, non-tender, non-distended, no organomegaly or masses appreciated. BS present in all quadrants Skin: wide spread confluent crusted dark red lesions on erythematous background and very painful to touch covering back, shoulders, abdominal wall and legs. Suprapubic area, abdominal fold and inner thighs bilaterally covered with grayish white scaly rash with  discrete areas of denuded skin  Musculoskeletal: grossly normal tone BUE/BLE, good ROM, no bony abnormality or joint deformities observed Psychiatric: grossly normal mood and affect, speech fluent and appropriate, alert and oriented x3 Neurologic: CN II-XII grossly intact, moves all extremities in coordinated fashion, sensation intact  Labs on Admission: I have personally reviewed following labs and imaging studies  CBC, BMP  GFR: CrCl cannot be calculated (Unknown ideal weight.).   Creatinine Clearance: CrCl cannot be calculated (Unknown ideal weight.).   Radiological Exams on Admission: No results found.  EKG: nto found  Assessment/Plan Principal Problem:   Diabetic ketoacidosis (HCC) Active Problems:   Depression   Dermatitis   HTN (hypertension)   HLD (hyperlipidemia)   DM (diabetes mellitus) ([place])   Candidiasis of genitalia in female  Newly diagnosed DM with diabetic ketoacidosis on admission Continue Glucose stabilizer protocol Obtain Hgb A1C Will order diabetic educator consult  Dermatitis Could be associated with Niacin deficiency vs autoimmune process vs viral manifestations Will obtain ANA and ser rate to r/o Lupus, HSV/HIV. HPV, TSH/FT4 testing Would highly recommend dermatology follow up biopsy  Genital Candidiasis Continue Diflucan orally, topical Monistat If persists, needs GYN follow up  HTN Continue home meds and adjust doses  HLD  Refresh lipid profile  Daily Headache Continue Topamax     DVT prophylaxis: Heparin Code Status: full Family Communication: at bedside Disposition Plan: SDU Consults called: none Admission status: inpatient   York Grice, [place] Pager: [phone] [facility]  If 7PM-7AM, please contact night-coverage [email or web address] Password [email or web address]  03/24/2016, 11:02 AM

## 2016-03-25 ENCOUNTER — Ambulatory Visit: Payer: PRIVATE HEALTH INSURANCE | Admitting: Family Medicine

## 2016-03-25 DIAGNOSIS — E119 Type 2 diabetes mellitus without complications: Secondary | ICD-10-CM | POA: Diagnosis not present

## 2016-03-25 DIAGNOSIS — E1110 Type 2 diabetes mellitus with ketoacidosis without coma: Secondary | ICD-10-CM | POA: Diagnosis not present

## 2016-03-25 DIAGNOSIS — E785 Hyperlipidemia, unspecified: Secondary | ICD-10-CM | POA: Diagnosis not present

## 2016-03-25 DIAGNOSIS — E1100 Type 2 diabetes mellitus with hyperosmolarity without nonketotic hyperglycemic-hyperosmolar coma (NKHHC): Secondary | ICD-10-CM | POA: Diagnosis not present

## 2016-03-25 DIAGNOSIS — L309 Dermatitis, unspecified: Secondary | ICD-10-CM

## 2016-03-25 DIAGNOSIS — B373 Candidiasis of vulva and vagina: Secondary | ICD-10-CM

## 2016-03-25 DIAGNOSIS — I10 Essential (primary) hypertension: Secondary | ICD-10-CM | POA: Diagnosis not present

## 2016-03-25 LAB — GLUCOSE, CAPILLARY
GLUCOSE-CAPILLARY: 403 mg/dL — AB (ref 65–99)
GLUCOSE-CAPILLARY: 251 mg/dL — AB (ref 65–99)
GLUCOSE-CAPILLARY: 332 mg/dL — AB (ref 65–99)
Glucose-Capillary: 223 mg/dL — ABNORMAL HIGH (ref 65–99)

## 2016-03-25 LAB — CBC
HEMATOCRIT: 37 % (ref 36.0–46.0)
HEMOGLOBIN: 11.8 g/dL — AB (ref 12.0–15.0)
MCH: 26.9 pg (ref 26.0–34.0)
MCHC: 31.9 g/dL (ref 30.0–36.0)
MCV: 84.5 fL (ref 78.0–100.0)
Platelets: 218 10*3/uL (ref 150–400)
RBC: 4.38 MIL/uL (ref 3.87–5.11)
RDW: 14.6 % (ref 11.5–15.5)
WBC: 11.7 10*3/uL — ABNORMAL HIGH (ref 4.0–10.5)

## 2016-03-25 LAB — BASIC METABOLIC PANEL
Anion gap: 10 (ref 5–15)
BUN: 6 mg/dL (ref 6–20)
CO2: 20 mmol/L — AB (ref 22–32)
Calcium: 8.4 mg/dL — ABNORMAL LOW (ref 8.9–10.3)
Chloride: 106 mmol/L (ref 101–111)
Creatinine, Ser: 0.68 mg/dL (ref 0.44–1.00)
GFR calc non Af Amer: >60 mL/min (ref >60)
GLUCOSE: 313 mg/dL — AB (ref 65–99)
Potassium: 3.6 mmol/L (ref 3.5–5.1)
Sodium: 136 mmol/L (ref 135–145)

## 2016-03-25 LAB — HIV ANTIBODY (ROUTINE TESTING W REFLEX): HIV Screen 4th Generation wRfx: NONREACTIVE

## 2016-03-25 LAB — ANA W/REFLEX IF POSITIVE: ANA: NEGATIVE

## 2016-03-25 MED ORDER — LIVING WELL WITH DIABETES BOOK
Freq: Once | Status: DC
  Filled 2016-03-25: qty 1

## 2016-03-25 MED ORDER — INSULIN STARTER KIT- PEN NEEDLES (ENGLISH)
1.0000 | Freq: Once | Status: AC
  Administered 2016-03-25: 1
  Filled 2016-03-25: qty 1

## 2016-03-25 MED ORDER — LIVING WELL WITH DIABETES BOOK
Freq: Once | Status: AC
  Administered 2016-03-25: 11:00:00
  Filled 2016-03-25: qty 1

## 2016-03-25 MED ORDER — INSULIN GLARGINE 100 UNIT/ML ~~LOC~~ SOLN
20.0000 [IU] | Freq: Every day | SUBCUTANEOUS | Status: DC
  Administered 2016-03-25 – 2016-03-26 (×2): 20 [IU] via SUBCUTANEOUS
  Filled 2016-03-25 (×2): qty 0.2

## 2016-03-25 NOTE — Progress Notes (Signed)
PROGRESS NOTE    Autumn Valenzuela  TWS:[id number] DOB: 1960/09/03 DOA: 03/24/2016 PCP: Barbette Merino, MD   Outpatient Specialists:     Brief Narrative:   Autumn Valenzuela is a 56 y.o. female with a Past Medical History of history of bipolar disorder, hypertension, hyperlipidemia, history of cerebrovascular accident with no gross deficits, migraine headaches who presents with 3-4 weeks of severe burning rash in her genital area as well as on her throughout her body with associated pruritus and vaginal discharge.  Admit/new onset dm, trace ketones, hyperglycemia, honk, pseudohyponatremia.   Assessment & Plan:   Principal Problem:   Diabetic ketoacidosis ([place]) Active Problems:   Chronic daily headache   Depression   Dermatitis   HTN (hypertension)   HLD (hyperlipidemia)   DM (diabetes mellitus) ([place])   Vaginal yeast infection   Newly diagnosed DM with diabetic ketoacidosis on admission Hgb A1C diabetic educator consult -SSI and lantus   Dermatitis Could be associated with Niacin deficiency vs autoimmune process vs viral manifestations ANA pending, HIV pending.   TSH/FT4 normal outpatient dermatology follow up for biopsy  Genital Candidiasis Continue Diflucan orally, topical Monistat -suspect as blood sugars controlled, will improve  HTN Continue home meds and adjust doses  HLD  Refresh lipid profile  Daily Headache Continue Topamax     DVT prophylaxis:  SQ Heparin  Code Status: Full Code   Family Communication: patient  Disposition Plan:     Consultants:        Subjective: Upset about how she was treated last night Says she has never had DM before Has appointment with [facility] primary care when she is discharged  Objective: Vitals:   03/24/16 0704 03/24/16 1248 03/24/16 2119 03/25/16 0444  BP: 104/76 115/71 (!) 84/47 (!) 107/51  Pulse: 78 80 74 71  Resp: '22 20 18 ' (!) 21  Temp: 98.4 F (36.9 C) 99 F (37.2 C) 98.2 F (36.8 C) 98 F  (36.7 C)  TempSrc: Oral Oral Oral   SpO2: 99% 100% 99% 100%    Intake/Output Summary (Last 24 hours) at 03/25/16 1304 Last data filed at 03/25/16 1227  Gross per 24 hour  Intake             2470 ml  Output                0 ml  Net             2470 ml   There were no vitals filed for this visit.  Examination:  General exam: Appears calm and comfortable  Respiratory system: Clear to auscultation. Respiratory effort normal. Cardiovascular system: S1 & S2 heard, RRR. No JVD, murmurs, rubs, gallops or clicks. No pedal edema. Gastrointestinal system: Abdomen is nondistended, soft and nontender. No organomegaly or masses felt. Normal bowel sounds heard. Skin: scaly/peeling rash on back- dry skin   Data Reviewed: I have personally reviewed following labs and imaging studies  CBC:  Recent Labs Lab 03/24/16 0631 03/24/16 1159 03/25/16 0616  WBC 13.7* 12.7* 11.7*  HGB 13.2 12.2 11.8*  HCT 39.7 37.5 37.0  MCV 83.9 83.1 84.5  PLT 241 196 001   Basic Metabolic Panel:  Recent Labs Lab 03/24/16 0631 03/24/16 1159 03/25/16 0616  NA 127*  --  136  K 3.8  --  3.6  CL 92*  --  106  CO2 21*  --  20*  GLUCOSE 797*  --  313*  BUN 7  --  6  CREATININE 0.98 0.70  0.68  CALCIUM 8.8*  --  8.4*   GFR: CrCl cannot be calculated (Unknown ideal weight.). Liver Function Tests: No results for input(s): AST, ALT, ALKPHOS, BILITOT, PROT, ALBUMIN in the last 168 hours. No results for input(s): LIPASE, AMYLASE in the last 168 hours. No results for input(s): AMMONIA in the last 168 hours. Coagulation Profile: No results for input(s): INR, PROTIME in the last 168 hours. Cardiac Enzymes: No results for input(s): CKTOTAL, CKMB, CKMBINDEX, TROPONINI in the last 168 hours. BNP (last 3 results) No results for input(s): PROBNP in the last 8760 hours. HbA1C: No results for input(s): HGBA1C in the last 72 hours. CBG:  Recent Labs Lab 03/24/16 1118 03/24/16 1704 03/24/16 2116 03/25/16 0826  03/25/16 1236  GLUCAP 385* 496* 191* 403* 251*   Lipid Profile: No results for input(s): CHOL, HDL, LDLCALC, TRIG, CHOLHDL, LDLDIRECT in the last 72 hours. Thyroid Function Tests:  Recent Labs  03/24/16 1159 03/24/16 1200  TSH  --  1.036  FREET4 1.08  --    Anemia Panel: No results for input(s): VITAMINB12, FOLATE, FERRITIN, TIBC, IRON, RETICCTPCT in the last 72 hours. Urine analysis:    Component Value Date/Time   COLORURINE COLORLESS (A) 03/24/2016 0626   APPEARANCEUR CLEAR 03/24/2016 0626   LABSPEC 1.030 03/24/2016 0626   PHURINE 6.0 03/24/2016 0626   GLUCOSEU >=500 (A) 03/24/2016 0626   HGBUR NEGATIVE 03/24/2016 0626   BILIRUBINUR NEGATIVE 03/24/2016 0626   KETONESUR 5 (A) 03/24/2016 0626   PROTEINUR NEGATIVE 03/24/2016 0626   UROBILINOGEN 0.2 11/29/2013 0445   NITRITE NEGATIVE 03/24/2016 0626   LEUKOCYTESUR NEGATIVE 03/24/2016 0626     )No results found for this or any previous visit (from the past 240 hour(s)).    Anti-infectives    Start     Dose/Rate Route Frequency Ordered Stop   03/24/16 1115  fluconazole (DIFLUCAN) tablet 150 mg     150 mg Oral Daily 03/24/16 1113     03/24/16 0800  fluconazole (DIFLUCAN) tablet 150 mg     150 mg Oral  Once 03/24/16 0759 03/24/16 0809       Radiology Studies: No results found.      Scheduled Meds: . amLODipine  5 mg Oral Daily  . ARIPiprazole  5 mg Oral Daily  . calcium-vitamin D  1 tablet Oral Q breakfast  . DULoxetine  60 mg Oral QHS  . fluconazole  150 mg Oral Daily  . heparin  5,000 Units Subcutaneous Q8H  . insulin aspart  0-15 Units Subcutaneous TID WC  . insulin glargine  20 Units Subcutaneous Daily  . insulin starter kit- pen needles  1 kit Other Once  . irbesartan  300 mg Oral Daily  . miconazole   Topical BID  . sodium chloride flush  3 mL Intravenous Q12H  . topiramate  50 mg Oral BID  . triamcinolone ointment   Topical TID   Continuous Infusions: . sodium chloride 100 mL/hr at 03/25/16  1137     LOS: 1 day    Time spent: 25 min    [place], DO [facility] Pager [phone]  If 7PM-7AM, please contact night-coverage [email or web address] Password [facility] 03/25/2016, 1:04 PM

## 2016-03-25 NOTE — Progress Notes (Signed)
Inpatient Diabetes Program Recommendations  AACE/ADA: New Consensus Statement on Inpatient Glycemic Control (2015)  Target Ranges:  Prepandial:   less than 140 mg/dL      Peak postprandial:   less than 180 mg/dL (1-2 hours)      Critically ill patients:  140 - 180 mg/dL   Lab Results  Component Value Date   GLUCAP 251 (H) 03/25/2016    Review of Glycemic Control:  Results for HILIARY, OSORTO (MRN [id number]) as of 03/25/2016 12:38  Ref. Range 03/24/2016 06:20 03/24/2016 11:18 03/24/2016 17:04 03/24/2016 21:16 03/25/2016 08:26 03/25/2016 12:36  Glucose-Capillary Latest Ref Range: 65 - 99 mg/dL >600 (HH) 385 (H) 496 (H) 191 (H) 403 (H) 251 (H)   Diabetes history: Type 2 diabetes Outpatient Diabetes medications: None Current orders for Inpatient glycemic control:  Novolog moderate tid with meals, Lantus 20 units daily (first dose today)  Inpatient Diabetes Program Recommendations:    A1C pending.  Spoke with patient regarding new diagnosis of diabetes.  She states that she was told in December that she had diabetes and was told to see PCP.  Patient had appointment with PCP from [place] today-she cancelled.  Briefly discussed Type 2 diabetes with patient including normal blood glucose levels, A1C, hyperglycemia signs and symptoms, and monitoring.  Also discussed use of insulin pen including how to place pen needle, priming insulin pen and administering dose.  Will need reinforcement however patient did state that she liked the pen and felt she could do this.  We also discussed complications of diabetes and that it is important to control blood pressure and cholesterol as well.  Patient eager to go home however I explained that she needed to see how the insulin is going to work and learn survival skills of diabetes including how to check blood sugars and give insulin.   Reinforced need to eliminate soda and sugary beverages.  RN states that patient was upset about this earlier today.  Also placed referral  for outpatient diabetes education per protocol.   Needs reinforcement of all education.   Thanks, Adah Perl, RN, BC-ADM Inpatient Diabetes Coordinator Pager [phone] (8a-5p)

## 2016-03-26 DIAGNOSIS — E119 Type 2 diabetes mellitus without complications: Secondary | ICD-10-CM | POA: Diagnosis not present

## 2016-03-26 DIAGNOSIS — E785 Hyperlipidemia, unspecified: Secondary | ICD-10-CM | POA: Diagnosis not present

## 2016-03-26 DIAGNOSIS — B373 Candidiasis of vulva and vagina: Secondary | ICD-10-CM | POA: Diagnosis not present

## 2016-03-26 DIAGNOSIS — E1110 Type 2 diabetes mellitus with ketoacidosis without coma: Secondary | ICD-10-CM | POA: Diagnosis not present

## 2016-03-26 DIAGNOSIS — L309 Dermatitis, unspecified: Secondary | ICD-10-CM | POA: Diagnosis not present

## 2016-03-26 LAB — CBC
HEMATOCRIT: 37 % (ref 36.0–46.0)
HEMOGLOBIN: 11.6 g/dL — AB (ref 12.0–15.0)
MCH: 26.6 pg (ref 26.0–34.0)
MCHC: 31.4 g/dL (ref 30.0–36.0)
MCV: 84.9 fL (ref 78.0–100.0)
Platelets: 224 10*3/uL (ref 150–400)
RBC: 4.36 MIL/uL (ref 3.87–5.11)
RDW: 14.8 % (ref 11.5–15.5)
WBC: 9.5 10*3/uL (ref 4.0–10.5)

## 2016-03-26 LAB — BASIC METABOLIC PANEL
ANION GAP: 8 (ref 5–15)
BUN: 5 mg/dL — ABNORMAL LOW (ref 6–20)
CHLORIDE: 108 mmol/L (ref 101–111)
CO2: 21 mmol/L — AB (ref 22–32)
CREATININE: 0.61 mg/dL (ref 0.44–1.00)
Calcium: 8.5 mg/dL — ABNORMAL LOW (ref 8.9–10.3)
GFR calc non Af Amer: >60 mL/min (ref >60)
GLUCOSE: 259 mg/dL — AB (ref 65–99)
Potassium: 4 mmol/L (ref 3.5–5.1)
Sodium: 137 mmol/L (ref 135–145)

## 2016-03-26 LAB — HEMOGLOBIN A1C: Hgb A1c MFr Bld: >15.5 % — ABNORMAL HIGH (ref 4.8–5.6)

## 2016-03-26 LAB — GLUCOSE, CAPILLARY
GLUCOSE-CAPILLARY: 237 mg/dL — AB (ref 65–99)
GLUCOSE-CAPILLARY: 142 mg/dL — AB (ref 65–99)
Glucose-Capillary: 239 mg/dL — ABNORMAL HIGH (ref 65–99)

## 2016-03-26 MED ORDER — INSULIN PEN NEEDLE 31G X 5 MM MISC: 0 refills | Status: DC

## 2016-03-26 MED ORDER — METFORMIN HCL 500 MG PO TABS: 500.0000 mg | ORAL_TABLET | Freq: Two times a day (BID) | ORAL | 0 refills | Status: DC

## 2016-03-26 MED ORDER — INSULIN GLARGINE 100 UNIT/ML ~~LOC~~ SOLN: 30.0000 [IU] | Freq: Every day | SUBCUTANEOUS | Status: DC

## 2016-03-26 MED ORDER — INSULIN GLARGINE 100 UNIT/ML SOLOSTAR PEN: 30.0000 [IU] | PEN_INJECTOR | Freq: Every day | SUBCUTANEOUS | 2 refills | Status: DC

## 2016-03-26 MED ORDER — BLOOD GLUCOSE METER KIT
PACK | 0 refills | Status: DC
Start: 2016-03-26 — End: 2018-07-16

## 2016-03-26 MED ORDER — FLUCONAZOLE 150 MG PO TABS: 150.0000 mg | ORAL_TABLET | Freq: Every day | ORAL | 0 refills | Status: DC

## 2016-03-26 MED ORDER — METFORMIN HCL 500 MG PO TABS: 500.0000 mg | ORAL_TABLET | Freq: Two times a day (BID) | ORAL | Status: DC

## 2016-03-26 MED ORDER — IRBESARTAN 150 MG PO TABS: 150.0000 mg | ORAL_TABLET | Freq: Every day | ORAL | 0 refills | Status: DC

## 2016-03-26 MED ORDER — TRIAMCINOLONE ACETONIDE 0.1 % EX OINT: TOPICAL_OINTMENT | Freq: Three times a day (TID) | CUTANEOUS | 0 refills | Status: DC

## 2016-03-26 MED ORDER — MICONAZOLE NITRATE 2 % EX CREA: TOPICAL_CREAM | Freq: Two times a day (BID) | CUTANEOUS | 0 refills | Status: DC

## 2016-03-26 MED ORDER — INSULIN ASPART 100 UNIT/ML FLEXPEN
PEN_INJECTOR | SUBCUTANEOUS | 2 refills | Status: DC
Start: 2016-03-26 — End: 2016-03-27

## 2016-03-26 MED ORDER — IRBESARTAN 300 MG PO TABS: 150.0000 mg | ORAL_TABLET | Freq: Every day | ORAL | Status: DC

## 2016-03-26 NOTE — Progress Notes (Deleted)
Call placed to PT for patient evaluation, in order for the process of of D/C to begin. PT states they will try their best to get to pt first thing tomorrow.

## 2016-03-26 NOTE — Progress Notes (Signed)
Inpatient Diabetes Program Recommendations  AACE/ADA: New Consensus Statement on Inpatient Glycemic Control (2015)  Target Ranges:  Prepandial:   less than 140 mg/dL      Peak postprandial:   less than 180 mg/dL (1-2 hours)      Critically ill patients:  140 - 180 mg/dL   Lab Results  Component Value Date   GLUCAP 223 (H) 03/25/2016    Diabetes history:  Type 2-New onset Outpatient Diabetes medications: None Current orders for Inpatient glycemic control:  Lantus 20 units daily, Novolog moderate tid with meals  Inpatient Diabetes Program Recommendations:   Consider increasing Lantus to 30 units daily.  Also consider adding Metformin 500 mg bid.    Thanks, Adah Perl, RN, BC-ADM Inpatient Diabetes Coordinator Pager [phone] (8a-5p)

## 2016-03-26 NOTE — Progress Notes (Signed)
Benefits check: lantus and levemir Autumn Valenzuela  Cecille Rubin, RN        Per rep at [facility]:   Checked both insulins in both pen and vial form- all are tier 3, covered, no auth required. Co-pay is $8.35 for all.   PS: patient has low income subsidy assistance: all co-pays are either $8.35 or $3.70   Previous Messages    ----- Message -----  From: Cecille Rubin, RN  Sent: 03/26/2016 10:37 AM  To: [facility] C[facility] Case Mgr Assistant  Subject: BENEFITS CHECK                  Please check Lantus 30 units daily and Levemir 30 units daily, copay and prior authorization if needed. Thanks     Whitman Hero RN,BSN,CM [phone]

## 2016-03-26 NOTE — Progress Notes (Signed)
Duncan Dull to be D/C'd Home per MD order.  Discussed with the patient and all questions fully answered.  VSS, Skin clean, dry and intact without evidence of skin break down, no evidence of skin tears noted. IV catheter discontinued intact. Site without signs and symptoms of complications. Dressing and pressure applied.  An After Visit Summary was printed and given to the patient. Patient received prescription.  D/c education completed with patient/family including follow up instructions, medication list, d/c activities limitations if indicated, with other d/c instructions as indicated by MD - patient able to verbalize understanding, all questions fully answered.   Patient instructed to return to ED, call 911, or call MD for any changes in condition.   Patient escorted via [place], and D/C home via private auto.  Dorris Carnes 03/26/2016 5:24 PM

## 2016-03-26 NOTE — Discharge Summary (Signed)
Physician Discharge Summary  Autumn Valenzuela OZH:[id number] DOB: 1960/01/24 DOA: 03/24/2016  PCP: No primary care provider on file.  Admit date: 03/24/2016 Discharge date: 03/26/2016   Recommendations for Outpatient Follow-Up:   1. Patient has appointment with [facility] PCP but she does not recall the name 2. Will need further titration of lantus 3. Patient to bring in log of blood sugars 4. Outpatient referral to derm for skin biopsy if not better 5. HgbA1C pending   Discharge Diagnosis:   Principal Problem:   Diabetic ketoacidosis ([place]) Active Problems:   Chronic daily headache   Depression   Dermatitis   HTN (hypertension)   HLD (hyperlipidemia)   DM (diabetes mellitus) ([place])   Vaginal yeast infection   Discharge disposition:  Home.   Discharge Condition: Improved.  Diet recommendation: Low sodium, heart healthy.  Carbohydrate-modified.  Wound care: None.   History of Present Illness:   Autumn Valenzuela is a 56 y.o. female with a Past Medical History of history of bipolar disorder, hypertension, hyperlipidemia, history of cerebrovascular accident with no gross deficits, migraine headaches who presents with 3-4 weeks of severe burning rash in her genital area as well as on her throughout her body with associated pruritus and vaginal discharge.  Admit/new onset dm, trace ketones, hyperglycemia, honk, pseudohyponatremia.  Insulin gtt per protocol.  Rash - etiology unclear, some serologies ordered, recd outpt derm fu for bx.  Trial kenaolog ointment.  Treat yeast infection.   Hospital Course by Problem:   Newly diagnosed DM with diabetic ketoacidosis on admission Hgb A1C pending diabetic educator consult -SSI and lantus   Dermatitis Could be associated with Niacin deficiency vs autoimmune process vs viral manifestations Labs normal- ANA, HIV  TSH/FT4 normal outpatient dermatology follow up for biopsy  Genital Candidiasis Continue Diflucan orally x 7 days, topical  Monistat -suspect as blood sugars controlled, will improve  HTN Decrease medications        Medical Consultants:    None.   Discharge Exam:   Vitals:   03/26/16 0445 03/26/16 0931  BP: (!) 93/59 (!) 105/59  Pulse: 70   Resp: 16   Temp: 98 F (36.7 C)    Vitals:   03/25/16 1415 03/25/16 2217 03/26/16 0445 03/26/16 0931  BP: (!) 90/42 (!) 90/51 (!) 93/59 (!) 105/59  Pulse: 70 71 70   Resp: '16 18 16   ' Temp: 97.7 F (36.5 C) 98.2 F (36.8 C) 98 F (36.7 C)   TempSrc: Oral Oral Oral   SpO2: 97% 100% 100%     Gen:  NAD Cardiovascular:  RRR, No M/R/G Respiratory: Lungs CTAB Gastrointestinal: Abdomen soft, NT/ND with normal active bowel sounds. Extremities: No C/E/C   The results of significant diagnostics from this hospitalization (including imaging, microbiology, ancillary and laboratory) are listed below for reference.     Procedures and Diagnostic Studies:   No results found.   Labs:   Basic Metabolic Panel:  Recent Labs Lab 03/24/16 0631 03/24/16 1159 03/25/16 0616 03/26/16 0412  NA 127*  --  136 137  K 3.8  --  3.6 4.0  CL 92*  --  106 108  CO2 21*  --  20* 21*  GLUCOSE 797*  --  313* 259*  BUN 7  --  6 5*  CREATININE 0.98 0.70 0.68 0.61  CALCIUM 8.8*  --  8.4* 8.5*   GFR CrCl cannot be calculated (Unknown ideal weight.). Liver Function Tests: No results for input(s): AST, ALT, ALKPHOS, BILITOT, PROT, ALBUMIN  in the last 168 hours. No results for input(s): LIPASE, AMYLASE in the last 168 hours. No results for input(s): AMMONIA in the last 168 hours. Coagulation profile No results for input(s): INR, PROTIME in the last 168 hours.  CBC:  Recent Labs Lab 03/24/16 0631 03/24/16 1159 03/25/16 0616 03/26/16 0412  WBC 13.7* 12.7* 11.7* 9.5  HGB 13.2 12.2 11.8* 11.6*  HCT 39.7 37.5 37.0 37.0  MCV 83.9 83.1 84.5 84.9  PLT 241 196 218 224   Cardiac Enzymes: No results for input(s): CKTOTAL, CKMB, CKMBINDEX, TROPONINI in the  last 168 hours. BNP: Invalid input(s): POCBNP CBG:  Recent Labs Lab 03/25/16 1236 03/25/16 1800 03/25/16 2210 03/26/16 0812 03/26/16 1202  GLUCAP 251* 332* 223* 239* 237*   D-Dimer No results for input(s): DDIMER in the last 72 hours. Hgb A1c No results for input(s): HGBA1C in the last 72 hours. Lipid Profile No results for input(s): CHOL, HDL, LDLCALC, TRIG, CHOLHDL, LDLDIRECT in the last 72 hours. Thyroid function studies  Recent Labs  03/24/16 1200  TSH 1.036   Anemia work up No results for input(s): VITAMINB12, FOLATE, FERRITIN, TIBC, IRON, RETICCTPCT in the last 72 hours. Microbiology No results found for this or any previous visit (from the past 240 hour(s)).   Discharge Instructions:   Discharge Instructions    Ambulatory referral to Nutrition and Diabetic Education    Complete by:  As directed      Allergies as of 03/26/2016      Reactions   Cefazolin Other (See Comments)   Rapid iv infusion caused swelling and burning at the iv site      Medication List    STOP taking these medications   amLODipine-valsartan 5-320 MG tablet Commonly known as:  EXFORGE     TAKE these medications   albuterol 108 (90 Base) MCG/ACT inhaler Commonly known as:  PROVENTIL HFA;VENTOLIN HFA Inhale 2 puffs into the lungs daily as needed for wheezing or shortness of breath.   albuterol (2.5 MG/3ML) 0.083% nebulizer solution Commonly known as:  PROVENTIL Take 2.5 mg by nebulization at bedtime as needed for wheezing.   ALPRAZolam 0.5 MG tablet Commonly known as:  XANAX Take 0.5 mg by mouth 2 (two) times daily as needed for anxiety.   ARIPiprazole 5 MG tablet Commonly known as:  ABILIFY Take 5 mg by mouth daily.   blood glucose meter kit and supplies Dispense based on patient and insurance preference. Use up to four times daily as directed. (FOR ICD-9 250.00, 250.01).   calcium-vitamin D 500-200 MG-UNIT tablet Commonly known as:  OSCAL WITH D Take 1 tablet by mouth  daily with breakfast.   DULoxetine 60 MG capsule Commonly known as:  CYMBALTA Take 60 mg by mouth at bedtime.   fluconazole 150 MG tablet Commonly known as:  DIFLUCAN Take 1 tablet (150 mg total) by mouth daily. Start taking on:  03/27/2016   insulin aspart 100 UNIT/ML FlexPen Commonly known as:  NOVOLOG TID with meals: CBG 70 - 120: 0 units CBG 121 - 150: 2 units CBG 151 - 200: 3 units CBG 201 - 250: 5 units CBG 251 - 300: 8 units CBG 301 - 350: 11 units CBG 351 - 400: 15 units   Insulin Glargine 100 UNIT/ML Solostar Pen Commonly known as:  LANTUS SOLOSTAR Inject 30 Units into the skin daily at 10 pm.   Insulin Pen Needle 31G X 5 MM Misc For use with solostar and novolog   irbesartan 150 MG tablet Commonly  known as:  AVAPRO Take 1 tablet (150 mg total) by mouth daily. Start taking on:  03/27/2016   metFORMIN 500 MG tablet Commonly known as:  GLUCOPHAGE Take 1 tablet (500 mg total) by mouth 2 (two) times daily with a meal.   miconazole 2 % cream Commonly known as:  MICOTIN Apply topically 2 (two) times daily.   topiramate 50 MG tablet Commonly known as:  TOPAMAX Take 1 tablet (50 mg total) by mouth 2 (two) times daily.   triamcinolone ointment 0.1 % Commonly known as:  KENALOG Apply topically 3 (three) times daily.   zolpidem 5 MG tablet Commonly known as:  AMBIEN Take 5 mg by mouth at bedtime as needed for sleep.      Follow-up Information    schedule appointment with PCP Follow up.   Why:  if rash does not clear, will need dermatology biopsy           Time coordinating discharge: 35 min  Signed:  [place]   [facility] 03/26/2016, 1:59 PM

## 2016-03-26 NOTE — Progress Notes (Addendum)
NT stated pt BP was  81/43 at 1504, rechecked pt BP manually at 1522, It was 100/62. Will continue to monitor.

## 2016-03-26 NOTE — Progress Notes (Signed)
Benefits check in process for Lantus 30 units daily and Levemir 30 units daily. CM to f/u with results. Whitman Hero RN, BSN,CM

## 2016-03-26 NOTE — Progress Notes (Signed)
Briefly spoke with patient regarding diabetes.  She has self administered insulin with RN. Reviewed survival skills of diabetes including hypoglycemia and treatment, hyperglycemia, how to give insulin with insulin pen, injection sites, and normal glucose levels.  She plans to follow-up with PCP at [facility].  RN to provide teaching on correction insulin as well.  Patient states she has no further questions at this time.  Thanks, Adah Perl, RN, BC-ADM Inpatient Diabetes Coordinator Pager [phone] (8a-5p)

## 2016-03-27 ENCOUNTER — Ambulatory Visit (INDEPENDENT_AMBULATORY_CARE_PROVIDER_SITE_OTHER): Payer: Medicare PPO | Admitting: Family Medicine

## 2016-03-27 ENCOUNTER — Encounter: Payer: Self-pay | Admitting: Family Medicine

## 2016-03-27 VITALS — BP 100/64 | HR 85 | Temp 98.0°F | Ht 60.24 in | Wt 210.4 lb

## 2016-03-27 DIAGNOSIS — E118 Type 2 diabetes mellitus with unspecified complications: Secondary | ICD-10-CM

## 2016-03-27 DIAGNOSIS — Z794 Long term (current) use of insulin: Secondary | ICD-10-CM

## 2016-03-27 DIAGNOSIS — F329 Major depressive disorder, single episode, unspecified: Secondary | ICD-10-CM | POA: Diagnosis not present

## 2016-03-27 DIAGNOSIS — F32A Depression, unspecified: Secondary | ICD-10-CM

## 2016-03-27 DIAGNOSIS — F319 Bipolar disorder, unspecified: Secondary | ICD-10-CM | POA: Diagnosis not present

## 2016-03-27 DIAGNOSIS — I10 Essential (primary) hypertension: Secondary | ICD-10-CM | POA: Diagnosis not present

## 2016-03-27 DIAGNOSIS — L309 Dermatitis, unspecified: Secondary | ICD-10-CM

## 2016-03-27 LAB — GLUCOSE, POCT (MANUAL RESULT ENTRY): POC GLUCOSE: 336 mg/dL — AB (ref 70–99)

## 2016-03-27 MED ORDER — INSULIN ASPART 100 UNIT/ML FLEXPEN: PEN_INJECTOR | SUBCUTANEOUS | 2 refills | Status: DC

## 2016-03-27 NOTE — Progress Notes (Signed)
Pre visit review using our clinic review tool, if applicable. No additional management support is needed unless otherwise documented below in the visit note. 

## 2016-03-27 NOTE — Patient Instructions (Signed)
Please call if you have any problems or questions  Bring your blood sugar log with you at your next visit

## 2016-03-27 NOTE — Progress Notes (Signed)
Subjective:    Patient ID: Autumn Valenzuela, female    DOB: 03-Apr-1960, 56 y.o.   MRN: [id number]  HPI This is a 56 yo female who presents today to establish care and follow up from recent hospitalization.   She was admitted from 03/24/2016 to 03/26/2016 with new onset diabetes mellitus and was started on Lantus 30 u qhs, novolog sliding scale insulin and metformin 500 mg BID. Hemoglobin A1c was greater than 15.5. A referral has been placed to diabetes education. She did not get her glucometer or Novolog when she went to her pharmacy yesterday, she did get her Lantus which her daughter gave her last night.  She was shown how to use a glucometer in the hospital and feels that she will be able to do this at home on her own.  She admits to feeling poorly about 3 months prior to hospitalization. She was urinating 20 or more times a day. She has noticed some numbness and tingling in her fingers and toes.  She was additionally diagnosed with dermatitis, she had a mildly elevated sedimentation rate, normal AMA and negative HIV. TSH was normal. She may need dermatology follow-up and biopsy. She presented with severe vaginal itching and rash and was treated for genital candidiasis. She reports that her rash and pruritus of her arms has improved significantly with triamcinolone ointment. She is currently taking Diflucan and using miconazole cream topically for vaginal symptoms.  Had 2 episodes of upper abdominal pain, once yesterday and once in the hospital, lasted about 30 minutes, resolved spontaneously. Some nausea, no vomiting.   Mood was pretty good prior to admission to hospital. Sees psychiatrist who manages alprazolam, Abilify, zolpidem and duloxetine. Lives with her daughter. Good support. She is feeling down with recent hospitalization but is hopeful that she will be able to get her blood sugars under control and transition to oral agents only. She is very motivated to carefully watch her diet. Her  daughter is helping her with this.  She works as a Clinical research associate for [employer].   Past Medical History:  Diagnosis Date  . Bipolar disorder ([place])   . CVA (cerebral infarction)   . Depression   . High cholesterol   . Hypertension   . Migraine    Past Surgical History:  Procedure Laterality Date  . BACK SURGERY    . PARTIAL HYSTERECTOMY     Family History  Problem Relation Age of Onset  . Diabetes Mother   . Stroke Father   . Cancer Paternal Aunt   . Cancer Paternal Aunt   . Diabetes Brother    Social History  Substance Use Topics  . Smoking status: Former Smoker    Quit date: 12/13/2015  . Smokeless tobacco: Never Used  . Alcohol use No      Review of Systems  Constitutional: Positive for diaphoresis and fatigue. Negative for fever.  Respiratory: Negative for cough, shortness of breath and wheezing.   Cardiovascular: Negative for chest pain, palpitations and leg swelling.  Gastrointestinal: Positive for abdominal pain (2x recently, none today). Negative for constipation and diarrhea.  Endocrine: Positive for polydipsia and polyuria.  Genitourinary: Positive for frequency. Negative for dysuria.  Neurological: Positive for numbness (fingers and toe). Negative for dizziness and light-headedness.  Psychiatric/Behavioral: Positive for dysphoric mood (with recent hospitalization). Negative for self-injury and suicidal ideas. The patient is nervous/anxious.        Objective:   Physical Exam  Constitutional: She is oriented to person, place, and time. She  appears well-developed and well-nourished. No distress.  HENT:  Head: Normocephalic and atraumatic.  Mouth/Throat: Oropharynx is clear and moist.  Eyes: Conjunctivae are normal.  Neck: Normal range of motion. Neck supple.  Cardiovascular: Normal rate, regular rhythm and normal heart sounds.   Pulmonary/Chest: Effort normal and breath sounds normal.  Abdominal: Soft. Bowel sounds are normal. She exhibits no distension. There  is no tenderness. There is no rebound and no guarding.  Musculoskeletal: She exhibits no edema.  Lymphadenopathy:    She has no cervical adenopathy.  Neurological: She is alert and oriented to person, place, and time.  Skin: Skin is warm and dry. She is not diaphoretic.  Scattered resolving rash on arms, upper trunk, some excoriation, ? From scratching, no erythema, no drainage. Skin very dry.   Psychiatric: She has a normal mood and affect. Her behavior is normal. Judgment and thought content normal.  Vitals reviewed.     BP 100/64 (BP Location: Left Arm, Patient Position: Sitting, Cuff Size: Normal)   Pulse 85   Temp 98 F (36.7 C) (Oral)   Ht 5' 0.24" (1.53 m)   Wt 210 lb 6.4 oz (95.4 kg)   SpO2 97%   BMI 40.77 kg/m  Wt Readings from Last 3 Encounters:  03/27/16 210 lb 6.4 oz (95.4 kg)  01/13/16 220 lb (99.8 kg)  07/24/15 220 lb (99.8 kg)   BP Readings from Last 3 Encounters:  03/27/16 100/64  03/26/16 100/62  01/13/16 121/82   Results for orders placed or performed in visit on 03/27/16  POC Glucose (CBG)  Result Value Ref Range   POC Glucose 336 (A) 70 - 99 mg/dl    Depression screen PHQ 2/9 03/27/2016  Decreased Interest 1  Down, Depressed, Hopeless 1  PHQ - 2 Score 2  Altered sleeping 3  Tired, decreased energy 3  Change in appetite 3  Feeling bad or failure about yourself  3  Trouble concentrating 3  Moving slowly or fidgety/restless 3  Suicidal thoughts 1  PHQ-9 Score 21  Difficult doing work/chores Somewhat difficult       Assessment & Plan:  1. Type 2 diabetes mellitus with complication, with long-term current use of insulin ([place]) - called patient's pharmacy to verify that glucometer was available for her to pick up and provided a written prescription for her Novolog SSI and reviewed use. - POC Glucose (CBG)- 336, improved from inpatient readings and anticipate them to improve further with SSI - provided written and verbal information regarding  diagnosis, she has already been referred to diabetes eductaion  2. Essential hypertension - well controlled on Avapro  3. Dermatitis - significantly improved since hospitalization, if it doesn't clear clear completely or if it recurs we'll refer her to dermatology for biopsy.  4. Bipolar affective disorder, remission status unspecified ([place]) - According to the patient this is been well controlled with the help of her psychiatrist. -Continue current medication and follow up with psychiatry  5. Depression, unspecified depression type - She admits to feeling more depressed and anxious today. This seems to be situational with recent hospitalization sensation and diabetes diagnosis. Have reassured her that we will follow her closely and work with her to get her diabetes under control with the hope that she will eventually be able to come off of insulin.  -Follow up in 2 weeks sooner if she has any problems or questions.   Clarene Reamer, FNP-BC  [facility] [place]  03/29/2016  9:08 AM

## 2016-03-29 ENCOUNTER — Telehealth: Payer: Self-pay | Admitting: Family Medicine

## 2016-03-29 DIAGNOSIS — F319 Bipolar disorder, unspecified: Secondary | ICD-10-CM | POA: Insufficient documentation

## 2016-03-29 NOTE — Telephone Encounter (Signed)
Called and spoke with patient. Clarified SSI dose. Blood sugars running 220-330. Having headache at back of neck, 8/10. Has felt dizzy. Not sleeping well due to worry. Skin peeling on heels, has been going on for awhile.  I instructed her to take ibuprofen or acetaminophen and one of her Xanax (has not taken any today) and someone from the office would call and check on her and schedule her an appointment.

## 2016-03-29 NOTE — Telephone Encounter (Signed)
Patient is calling to proper dosage of her insulin aspart (NOVOLOG) 100 UNIT/ML FlexPen?  The heal of her feet are raw and she has been feeling lightheaded.   Thank you,  -LL

## 2016-03-29 NOTE — Telephone Encounter (Signed)
Please advise 

## 2016-03-29 NOTE — Telephone Encounter (Signed)
LM for patient to return call.

## 2016-03-31 ENCOUNTER — Emergency Department (HOSPITAL_COMMUNITY)
Admission: EM | Admit: 2016-03-31 | Discharge: 2016-03-31 | Disposition: A | Discharge status: Home / Self Care | Payer: Medicare PPO | Attending: Emergency Medicine | Admitting: Emergency Medicine

## 2016-03-31 ENCOUNTER — Encounter (HOSPITAL_COMMUNITY): Payer: Self-pay | Admitting: Emergency Medicine

## 2016-03-31 DIAGNOSIS — Z87891 Personal history of nicotine dependence: Secondary | ICD-10-CM | POA: Insufficient documentation

## 2016-03-31 DIAGNOSIS — Z794 Long term (current) use of insulin: Secondary | ICD-10-CM | POA: Insufficient documentation

## 2016-03-31 DIAGNOSIS — J0110 Acute frontal sinusitis, unspecified: Secondary | ICD-10-CM

## 2016-03-31 DIAGNOSIS — Z8673 Personal history of transient ischemic attack (TIA), and cerebral infarction without residual deficits: Secondary | ICD-10-CM | POA: Insufficient documentation

## 2016-03-31 DIAGNOSIS — Z79899 Other long term (current) drug therapy: Secondary | ICD-10-CM | POA: Diagnosis not present

## 2016-03-31 DIAGNOSIS — J0111 Acute recurrent frontal sinusitis: Secondary | ICD-10-CM | POA: Insufficient documentation

## 2016-03-31 DIAGNOSIS — E1165 Type 2 diabetes mellitus with hyperglycemia: Secondary | ICD-10-CM | POA: Diagnosis not present

## 2016-03-31 DIAGNOSIS — I10 Essential (primary) hypertension: Secondary | ICD-10-CM | POA: Insufficient documentation

## 2016-03-31 DIAGNOSIS — R739 Hyperglycemia, unspecified: Secondary | ICD-10-CM

## 2016-03-31 DIAGNOSIS — E860 Dehydration: Secondary | ICD-10-CM | POA: Insufficient documentation

## 2016-03-31 HISTORY — DX: Type 2 diabetes mellitus without complications: E11.9

## 2016-03-31 LAB — URINALYSIS, ROUTINE W REFLEX MICROSCOPIC
BILIRUBIN URINE: NEGATIVE
Bacteria, UA: NONE SEEN
Glucose, UA: >=500 mg/dL — AB
HGB URINE DIPSTICK: NEGATIVE
Ketones, ur: NEGATIVE mg/dL
LEUKOCYTES UA: NEGATIVE
Nitrite: NEGATIVE
PH: 5 (ref 5.0–8.0)
Protein, ur: NEGATIVE mg/dL
RBC / HPF: NONE SEEN RBC/hpf (ref 0–5)
SPECIFIC GRAVITY, URINE: 1.035 — AB (ref 1.005–1.030)
WBC, UA: NONE SEEN WBC/hpf (ref 0–5)

## 2016-03-31 LAB — HEPATIC FUNCTION PANEL
ALT: 23 U/L (ref 14–54)
AST: 19 U/L (ref 15–41)
Albumin: 3.1 g/dL — ABNORMAL LOW (ref 3.5–5.0)
Alkaline Phosphatase: 63 U/L (ref 38–126)
Bilirubin, Direct: <0.1 mg/dL — ABNORMAL LOW (ref 0.1–0.5)
TOTAL PROTEIN: 6.8 g/dL (ref 6.5–8.1)
Total Bilirubin: 0.3 mg/dL (ref 0.3–1.2)

## 2016-03-31 LAB — CBC
HCT: 40.4 % (ref 36.0–46.0)
Hemoglobin: 12.9 g/dL (ref 12.0–15.0)
MCH: 27.8 pg (ref 26.0–34.0)
MCHC: 31.9 g/dL (ref 30.0–36.0)
MCV: 87.1 fL (ref 78.0–100.0)
PLATELETS: 250 10*3/uL (ref 150–400)
RBC: 4.64 MIL/uL (ref 3.87–5.11)
RDW: 16.1 % — ABNORMAL HIGH (ref 11.5–15.5)
WBC: 12.3 10*3/uL — AB (ref 4.0–10.5)

## 2016-03-31 LAB — BASIC METABOLIC PANEL
Anion gap: 8 (ref 5–15)
BUN: 8 mg/dL (ref 6–20)
CO2: 25 mmol/L (ref 22–32)
Calcium: 9.2 mg/dL (ref 8.9–10.3)
Chloride: 104 mmol/L (ref 101–111)
Creatinine, Ser: 0.83 mg/dL (ref 0.44–1.00)
GFR calc non Af Amer: >60 mL/min (ref >60)
Glucose, Bld: 275 mg/dL — ABNORMAL HIGH (ref 65–99)
Potassium: 4.2 mmol/L (ref 3.5–5.1)
SODIUM: 137 mmol/L (ref 135–145)

## 2016-03-31 LAB — CBG MONITORING, ED
GLUCOSE-CAPILLARY: 286 mg/dL — AB (ref 65–99)
Glucose-Capillary: 127 mg/dL — ABNORMAL HIGH (ref 65–99)

## 2016-03-31 MED ORDER — SODIUM CHLORIDE 0.9 % IV BOLUS (SEPSIS)
1000.0000 mL | Freq: Once | INTRAVENOUS | Status: AC
  Administered 2016-03-31: 1000 mL via INTRAVENOUS

## 2016-03-31 MED ORDER — LORATADINE 10 MG PO TABS: 10.0000 mg | ORAL_TABLET | Freq: Every day | ORAL | 0 refills | Status: DC

## 2016-03-31 MED ORDER — ACETAMINOPHEN 325 MG PO TABS
650.0000 mg | ORAL_TABLET | Freq: Once | ORAL | Status: AC
  Administered 2016-03-31: 650 mg via ORAL
  Filled 2016-03-31: qty 2

## 2016-03-31 MED ORDER — OXYMETAZOLINE HCL 0.05 % NA SOLN: 1.0000 | Freq: Two times a day (BID) | NASAL | 0 refills | Status: AC

## 2016-03-31 MED ORDER — FLUTICASONE PROPIONATE 50 MCG/ACT NA SUSP: 2.0000 | Freq: Every day | NASAL | 0 refills | Status: DC

## 2016-03-31 NOTE — ED Provider Notes (Signed)
[facility] DEPT Provider Note   CSN: [id number] Arrival date & time: 03/31/16  1431     History   Chief Complaint Chief Complaint  Patient presents with  . Hyperglycemia    HPI Autumn Valenzuela is a 56 y.o. female.  HPI Patient presents with several days of nasal congestion, frontal headache, lightheadedness with standing and persistently elevated blood sugars. States her blood sugar was greater than 500 earlier today. Has since improved. Denies any neck pain or stiffness. No focal weakness or numbness. No nausea or vomiting. No chest pain or shortness of breath. No cough. No fever or chills. Patient states she was recently diagnosed with diabetes and has been compliant with medication. Has follow-up appointment with her primary physician tomorrow. Endorses urinary frequency but denies any dysuria or hematuria. Past Medical History:  Diagnosis Date  . Bipolar disorder ([place])   . CVA (cerebral infarction)   . Depression   . Diabetes mellitus without complication ([employer])   . High cholesterol   . Hypertension   . Migraine     Patient Active Problem List   Diagnosis Date Noted  . Bipolar disorder ([place]) 03/29/2016  . Diabetic ketoacidosis ([place]) 03/24/2016  . Dermatitis 03/24/2016  . HTN (hypertension) 03/24/2016  . HLD (hyperlipidemia) 03/24/2016  . DM (diabetes mellitus) ([place]) 03/24/2016  . Vaginal yeast infection 03/24/2016  . Hyperglycemia   . Chronic daily headache 11/23/2012  . 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 Depression 11/23/2012    Past Surgical History:  Procedure Laterality Date  . BACK SURGERY    . PARTIAL HYSTERECTOMY      OB History    No data available       Home Medications    Prior to Admission medications   Medication Sig Start Date End Date Taking? Authorizing Provider  albuterol (PROVENTIL HFA;VENTOLIN HFA) 108 (90 BASE) MCG/ACT inhaler Inhale 2 puffs into the lungs daily as needed for wheezing or  shortness of breath.    Historical Provider, MD  albuterol (PROVENTIL) (2.5 MG/3ML) 0.083% nebulizer solution Take 2.5 mg by nebulization at bedtime as needed for wheezing.    Historical Provider, MD  ALPRAZolam Duanne Moron) 0.5 MG tablet Take 0.5 mg by mouth 2 (two) times daily as needed for anxiety.    Historical Provider, MD  ARIPiprazole (ABILIFY) 5 MG tablet Take 5 mg by mouth daily.  08/25/13   Historical Provider, MD  blood glucose meter kit and supplies Dispense based on patient and insurance preference. Use up to four times daily as directed. (FOR ICD-9 250.00, 250.01). 03/26/16   Geradine Girt, DO  calcium-vitamin D (OSCAL WITH D) 500-200 MG-UNIT per tablet Take 1 tablet by mouth daily with breakfast.    Historical Provider, MD  DULoxetine (CYMBALTA) 60 MG capsule Take 60 mg by mouth at bedtime.     Historical Provider, MD  fluticasone (FLONASE) 50 MCG/ACT nasal spray Place 2 sprays into both nostrils daily. 03/31/16   Julianne Rice, MD  insulin aspart (NOVOLOG) 100 UNIT/ML FlexPen TID with meals: CBG 70 - 120: 0 units CBG 121 - 150: 2 units CBG 151 - 200: 3 units CBG 201 - 250: 5 units CBG 251 - 300: 8 units CBG 301 - 350: 11 units CBG 351 - 400: 15 units 03/27/16   Elby Beck, FNP  Insulin Glargine (LANTUS SOLOSTAR) 100 UNIT/ML Solostar Pen Inject 30 Units into the skin daily at 10 pm. 03/26/16   Geradine Girt, DO  Insulin Pen Needle 31G  X 5 MM MISC For use with solostar and novolog 03/26/16   Geradine Girt, DO  irbesartan (AVAPRO) 150 MG tablet Take 1 tablet (150 mg total) by mouth daily. 03/27/16   Geradine Girt, DO  loratadine (CLARITIN) 10 MG tablet Take 1 tablet (10 mg total) by mouth daily. 03/31/16   Julianne Rice, MD  metFORMIN (GLUCOPHAGE) 500 MG tablet Take 1 tablet (500 mg total) by mouth 2 (two) times daily with a meal. 03/26/16   Geradine Girt, DO  miconazole (MICOTIN) 2 % cream Apply topically 2 (two) times daily. 03/26/16   Geradine Girt, DO  oxymetazoline (AFRIN  NASAL SPRAY) 0.05 % nasal spray Place 1 spray into both nostrils 2 (two) times daily. 03/31/16 04/03/16  Julianne Rice, MD  topiramate (TOPAMAX) 50 MG tablet Take 1 tablet (50 mg total) by mouth 2 (two) times daily. 03/10/13   Garvin Fila, MD  triamcinolone ointment (KENALOG) 0.1 % Apply topically 3 (three) times daily. 03/26/16   Geradine Girt, DO  zolpidem (AMBIEN) 5 MG tablet Take 5 mg by mouth at bedtime as needed for sleep.    Historical Provider, MD    Family History Family History  Problem Relation Age of Onset  . Diabetes Mother   . Stroke Father   . Cancer Paternal Aunt   . Cancer Paternal Aunt   . Diabetes Brother     Social History Social History  Substance Use Topics  . Smoking status: Former Smoker    Quit date: 12/13/2015  . Smokeless tobacco: Never Used  . Alcohol use No     Allergies   Cefazolin   Review of Systems Review of Systems  Constitutional: Negative for chills and fever.  HENT: Positive for congestion, rhinorrhea and sinus pressure. Negative for sore throat, trouble swallowing and voice change.   Eyes: Negative for visual disturbance.  Respiratory: Negative for cough, chest tightness and shortness of breath.   Cardiovascular: Negative for chest pain, palpitations and leg swelling.  Gastrointestinal: Negative for abdominal pain, constipation, diarrhea, nausea and vomiting.  Genitourinary: Positive for frequency. Negative for dysuria and flank pain.  Musculoskeletal: Negative for arthralgias, myalgias, neck pain and neck stiffness.  Skin: Negative for rash and wound.  Neurological: Positive for dizziness, light-headedness and headaches. Negative for weakness and numbness.  All other systems reviewed and are negative.    Physical Exam Updated Vital Signs BP (!) 120/93   Pulse 69   Temp 98.2 F (36.8 C) (Oral)   Resp 15   Ht 5' (1.524 m)   Wt 210 lb (95.3 kg)   SpO2 99%   BMI 41.01 kg/m   Physical Exam  Constitutional: She is oriented  to person, place, and time. She appears well-developed and well-nourished. No distress.  HENT:  Head: Normocephalic and atraumatic.  Mouth/Throat: Oropharynx is clear and moist.  Bilateral nasal mucosal edema. Patient has bilateral frontal sinus tenderness with palpation. Oropharynx is clear.  Eyes: EOM are normal. Pupils are equal, round, and reactive to light.  Neck: Normal range of motion. Neck supple.  No meningismus. No cervical adenopathy.  Cardiovascular: Normal rate and regular rhythm.  Exam reveals no gallop and no friction rub.   No murmur heard. Pulmonary/Chest: Effort normal and breath sounds normal. No respiratory distress. She has no wheezes. She has no rales. She exhibits no tenderness.  Abdominal: Soft. Bowel sounds are normal. There is no tenderness. There is no rebound and no guarding.  Musculoskeletal: Normal range of motion.  She exhibits no edema or tenderness.  No lower sugary swelling, asymmetry or tenderness. No midline thoracic or lumbar tenderness. No CVA tenderness.  Neurological: She is alert and oriented to person, place, and time.  Moving all extremities without deficit. Sensation fully intact.  Skin: Skin is warm and dry. Capillary refill takes less than 2 seconds. No rash noted. No erythema.  Psychiatric: She has a normal mood and affect. Her behavior is normal.  Nursing note and vitals reviewed.    ED Treatments / Results  Labs (all labs ordered are listed, but only abnormal results are displayed) Labs Reviewed  BASIC METABOLIC PANEL - Abnormal; Notable for the following:       Result Value   Glucose, Bld 275 (*)    All other components within normal limits  CBC - Abnormal; Notable for the following:    WBC 12.3 (*)    RDW 16.1 (*)    All other components within normal limits  URINALYSIS, ROUTINE W REFLEX MICROSCOPIC - Abnormal; Notable for the following:    APPearance HAZY (*)    Specific Gravity, Urine 1.035 (*)    Glucose, UA >=500 (*)     Squamous Epithelial / LPF 6-30 (*)    All other components within normal limits  HEPATIC FUNCTION PANEL - Abnormal; Notable for the following:    Albumin 3.1 (*)    Bilirubin, Direct <0.1 (*)    All other components within normal limits  CBG MONITORING, ED - Abnormal; Notable for the following:    Glucose-Capillary 286 (*)    All other components within normal limits  CBG MONITORING, ED - Abnormal; Notable for the following:    Glucose-Capillary 127 (*)    All other components within normal limits    EKG  EKG Interpretation  Date/Time:  Sunday March 31 2016 15:59:19 EDT Ventricular Rate:  78 PR Interval:    QRS Duration: 82 QT Interval:  351 QTC Calculation: 400 R Axis:   51 Text Interpretation:  Sinus rhythm Low voltage, precordial leads Confirmed by Lita Mains  MD, Safiatou Islam ([id number]) on 03/31/2016 8:54:33 PM       Radiology No results found.  Procedures Procedures (including critical care time)  Medications Ordered in ED Medications  sodium chloride 0.9 % bolus 1,000 mL (0 mLs Intravenous Stopped 03/31/16 1938)  sodium chloride 0.9 % bolus 1,000 mL (0 mLs Intravenous Stopped 03/31/16 2040)  acetaminophen (TYLENOL) tablet 650 mg (650 mg Oral Given 03/31/16 2039)     Initial Impression / Assessment and Plan / ED Course  I have reviewed the triage vital signs and the nursing notes.  Pertinent labs & imaging results that were available during my care of the patient were reviewed by me and considered in my medical decision making (see chart for details).     Patient is well-appearing. Suspect URI and sinusitis. This may be responsible for the patient's persistently elevated blood sugar. Also suspect some degree of dehydration. Give IV fluids and will start on medication for sinus infection. She has a follow-up appointment with her primary physician tomorrow. Return precautions given.  Final Clinical Impressions(s) / ED Diagnoses   Final diagnoses:  Hyperglycemia    Dehydration  Acute frontal sinusitis, recurrence not specified    New Prescriptions New Prescriptions   FLUTICASONE (FLONASE) 50 MCG/ACT NASAL SPRAY    Place 2 sprays into both nostrils daily.   LORATADINE (CLARITIN) 10 MG TABLET    Take 1 tablet (10 mg total) by mouth daily.  OXYMETAZOLINE (AFRIN NASAL SPRAY) 0.05 % NASAL SPRAY    Place 1 spray into both nostrils 2 (two) times daily.     Julianne Rice, MD 03/31/16 [phone]

## 2016-03-31 NOTE — ED Notes (Signed)
Pt ambulated to room from waiting room, tolerated well. 

## 2016-03-31 NOTE — ED Notes (Addendum)
Pt requests IV to be taken out.

## 2016-03-31 NOTE — ED Notes (Signed)
Pt stated unable to provide urine sample at this time.

## 2016-03-31 NOTE — ED Triage Notes (Signed)
Pt new onset diabetes - dx last week-- was inpt-- d/c'd from hospital 3/11, states cbg has been getting higher -- was 503 today--

## 2016-04-01 ENCOUNTER — Encounter: Payer: Self-pay | Admitting: Family Medicine

## 2016-04-01 ENCOUNTER — Ambulatory Visit (INDEPENDENT_AMBULATORY_CARE_PROVIDER_SITE_OTHER): Payer: Medicare PPO | Admitting: Family Medicine

## 2016-04-01 VITALS — BP 110/76 | HR 77 | Temp 98.4°F | Wt 211.6 lb

## 2016-04-01 DIAGNOSIS — B9789 Other viral agents as the cause of diseases classified elsewhere: Secondary | ICD-10-CM | POA: Diagnosis not present

## 2016-04-01 DIAGNOSIS — E118 Type 2 diabetes mellitus with unspecified complications: Secondary | ICD-10-CM | POA: Diagnosis not present

## 2016-04-01 DIAGNOSIS — L608 Other nail disorders: Secondary | ICD-10-CM | POA: Diagnosis not present

## 2016-04-01 DIAGNOSIS — Z794 Long term (current) use of insulin: Secondary | ICD-10-CM

## 2016-04-01 DIAGNOSIS — M79671 Pain in right foot: Secondary | ICD-10-CM | POA: Diagnosis not present

## 2016-04-01 DIAGNOSIS — M79672 Pain in left foot: Secondary | ICD-10-CM

## 2016-04-01 DIAGNOSIS — J329 Chronic sinusitis, unspecified: Secondary | ICD-10-CM

## 2016-04-01 NOTE — Progress Notes (Signed)
Pre visit review using our clinic review tool, if applicable. No additional management support is needed unless otherwise documented below in the visit note. 

## 2016-04-01 NOTE — Patient Instructions (Signed)
Diabetes education- you will be meeting with Bev at [address] (across from [facility]), [address]  Tuesday, 3/27 3:30  Their number is [phone]  I have put in a referral for you to see the podiatrist, you will get a call about scheduling

## 2016-04-01 NOTE — Progress Notes (Signed)
Subjective:    Patient ID: Autumn Valenzuela, female    DOB: April 30, 1960, 56 y.o.   MRN: [id number]  HPI This is a 56 year old female who presents today for follow up of new onset diabetes mellitus type 2, recent ER visit and bilateral foot cracking and pain.  She went to the ER yesterday and was diagnosed with frontal sinusitis given Claritin and Flonase and Afrin. She was found to be mildly dehydrated at the time and was given several liters of normal saline. In the ER, blood sugars ranged from 127-286. Has not gotten her meds filled yet, she is going to get them after leaving here. Some chills. Clear nasal drainage. Headache and sinus pressure improved. No cough, no wheeze, no SOB.  Blood sugar this morning 330. Last night had [employer], crackers, Sprite Zero.  Is concerned about going back to work tomorrow, not sure how to manage when she is away from her daughter.   Past Medical History:  Diagnosis Date  . Bipolar disorder ([place])   . CVA (cerebral infarction)   . Depression   . Diabetes mellitus without complication ([place])   . High cholesterol   . Hypertension   . Migraine    Past Surgical History:  Procedure Laterality Date  . BACK SURGERY    . PARTIAL HYSTERECTOMY     Family History  Problem Relation Age of Onset  . Diabetes Mother   . Stroke Father   . Cancer Paternal Aunt   . Cancer Paternal Aunt   . Diabetes Brother    Social History  Substance Use Topics  . Smoking status: Former Smoker    Quit date: 12/13/2015  . Smokeless tobacco: Never Used  . Alcohol use No      Review of Systems  Constitutional: Positive for chills.  HENT: Positive for rhinorrhea and sinus pain. Negative for ear discharge and sore throat.   Eyes: Positive for discharge (clear, watery) and itching.  Respiratory: Negative for cough, shortness of breath and wheezing.   Cardiovascular: Negative for chest pain, palpitations and leg swelling.  Neurological: Positive for headaches (improved).         Objective:   Physical Exam  Constitutional: She is oriented to person, place, and time. She appears well-developed and well-nourished. No distress.  HENT:  Head: Normocephalic and atraumatic.  Right Ear: External ear normal.  Left Ear: External ear normal.  Nose: Mucosal edema and rhinorrhea present.  Mouth/Throat: Oropharynx is clear and moist.  Eyes: Conjunctivae are normal. Right eye exhibits discharge. Left eye exhibits discharge.  Eyes watery.   Cardiovascular: Normal rate, regular rhythm and normal heart sounds.   Pulmonary/Chest: Effort normal and breath sounds normal.  Neurological: She is alert and oriented to person, place, and time.  Skin: Skin is warm and dry. She is not diaphoretic.  All toenails thick and yellow. Feet very dry with some cracking on heels. Sensation intact. Good pulses. No erythema or drainage. No open wounds.   Psychiatric: She has a normal mood and affect. Her behavior is normal. Judgment and thought content normal.  Vitals reviewed.     BP 110/76 (BP Location: Left Arm, Patient Position: Sitting, Cuff Size: Normal)   Pulse 77   Temp 98.4 F (36.9 C) (Oral)   Wt 211 lb 9.6 oz (96 kg)   SpO2 97%   BMI 41.33 kg/m  Wt Readings from Last 3 Encounters:  04/01/16 211 lb 9.6 oz (96 kg)  03/31/16 210 lb (95.3 kg)  03/27/16 210  lb 6.4 oz (95.4 kg)       Assessment & Plan:  1. Type 2 diabetes mellitus with complication, with long-term current use of insulin ([place]) - called diabetic education center and got patient an appointment for next week, informed patient of appointment verbally and provided written reminder - reviewed SSI and Lantus administration, patient verbalized understanding - Ambulatory referral to Podiatry - follow up in 3 weeks, sooner if needed - OOW until after she meets with Diabetic Education  2. Toenail deformity - Ambulatory referral to Podiatry  3. Foot pain, bilateral - Ambulatory referral to Podiatry  4. Viral  sinusitis - start meds per ER- Claritin, Afrin, Flonase - if continued symptoms, may need antibiotic, I have instructed her to let me know if not improved in 2-4 days  Clarene Reamer, FNP-BC  [facility]  04/01/2016 12:56 PM

## 2016-04-01 NOTE — Telephone Encounter (Signed)
Patient coming in for appointment today.

## 2016-04-08 ENCOUNTER — Encounter (HOSPITAL_COMMUNITY): Payer: Self-pay

## 2016-04-08 ENCOUNTER — Emergency Department (HOSPITAL_COMMUNITY)
Admission: EM | Admit: 2016-04-08 | Discharge: 2016-04-09 | Disposition: A | Discharge status: Home / Self Care | Payer: Medicare PPO | Attending: Emergency Medicine | Admitting: Emergency Medicine

## 2016-04-08 DIAGNOSIS — Z87891 Personal history of nicotine dependence: Secondary | ICD-10-CM | POA: Diagnosis not present

## 2016-04-08 DIAGNOSIS — I10 Essential (primary) hypertension: Secondary | ICD-10-CM | POA: Insufficient documentation

## 2016-04-08 DIAGNOSIS — E119 Type 2 diabetes mellitus without complications: Secondary | ICD-10-CM | POA: Insufficient documentation

## 2016-04-08 DIAGNOSIS — Z8673 Personal history of transient ischemic attack (TIA), and cerebral infarction without residual deficits: Secondary | ICD-10-CM | POA: Insufficient documentation

## 2016-04-08 DIAGNOSIS — Z79899 Other long term (current) drug therapy: Secondary | ICD-10-CM | POA: Insufficient documentation

## 2016-04-08 DIAGNOSIS — Z794 Long term (current) use of insulin: Secondary | ICD-10-CM | POA: Diagnosis not present

## 2016-04-08 DIAGNOSIS — R51 Headache: Secondary | ICD-10-CM | POA: Insufficient documentation

## 2016-04-08 DIAGNOSIS — R519 Headache, unspecified: Secondary | ICD-10-CM

## 2016-04-08 LAB — BASIC METABOLIC PANEL
Anion gap: 10 (ref 5–15)
BUN: 10 mg/dL (ref 6–20)
CO2: 24 mmol/L (ref 22–32)
Calcium: 9.1 mg/dL (ref 8.9–10.3)
Chloride: 101 mmol/L (ref 101–111)
Creatinine, Ser: 0.64 mg/dL (ref 0.44–1.00)
GFR calc Af Amer: >60 mL/min (ref >60)
Glucose, Bld: 344 mg/dL — ABNORMAL HIGH (ref 65–99)
POTASSIUM: 4.3 mmol/L (ref 3.5–5.1)
SODIUM: 135 mmol/L (ref 135–145)

## 2016-04-08 LAB — CBC
HCT: 40.9 % (ref 36.0–46.0)
Hemoglobin: 13.2 g/dL (ref 12.0–15.0)
MCH: 27.7 pg (ref 26.0–34.0)
MCHC: 32.3 g/dL (ref 30.0–36.0)
MCV: 85.7 fL (ref 78.0–100.0)
Platelets: 263 10*3/uL (ref 150–400)
RBC: 4.77 MIL/uL (ref 3.87–5.11)
RDW: 15 % (ref 11.5–15.5)
WBC: 13.7 10*3/uL — ABNORMAL HIGH (ref 4.0–10.5)

## 2016-04-08 LAB — CBG MONITORING, ED: GLUCOSE-CAPILLARY: 259 mg/dL — AB (ref 65–99)

## 2016-04-08 MED ORDER — SODIUM CHLORIDE 0.9 % IV BOLUS (SEPSIS)
1000.0000 mL | Freq: Once | INTRAVENOUS | Status: AC
  Administered 2016-04-08: 1000 mL via INTRAVENOUS

## 2016-04-08 MED ORDER — KETOROLAC TROMETHAMINE 30 MG/ML IJ SOLN
15.0000 mg | Freq: Once | INTRAMUSCULAR | Status: AC
  Administered 2016-04-08: 15 mg via INTRAVENOUS
  Filled 2016-04-08: qty 1

## 2016-04-08 NOTE — ED Triage Notes (Signed)
Pt was recently dx with type 2 dm. She has been having migraines not improved with Topamax. Pt states the headache has been causing her to see spots and have blurred vision with sensitivity to light. Pt reports headache has been going on x 3 weeks. Blurred vision and "spots" began yesterday.

## 2016-04-08 NOTE — ED Provider Notes (Signed)
[facility] DEPT Provider Note   CSN: [id number] Arrival date & time: 04/08/16  1832     History   Chief Complaint Chief Complaint  Patient presents with  . Migraine    HPI Autumn Valenzuela is a 56 y.o. female with an extensive past medical history who was recently diagnosed with diabetes who presents emergency Department with chief complaint of intractable headache. Autumn Valenzuela has a past medical history of chronic headaches and has been taking Topamax, which generally works for her headaches. Patient states that this evening Autumn Valenzuela cannot control the headache even after taking her Topamax and decided to come to the emergency department. Autumn Valenzuela also complains of blurry vision, which frequently accompanies her headaches. Autumn Valenzuela has some mild nausea without vomiting. Autumn Valenzuela denies all other neurologic symptoms, stiff neck, fevers, or sudden onset of headache. Autumn Valenzuela did not take any pain medication such as Tylenol or Motrin for her headache tonight. HPI  Past Medical History:  Diagnosis Date  . Bipolar disorder ([place])   . CVA (cerebral infarction)   . Depression   . Diabetes mellitus without complication ([place])   . High cholesterol   . Hypertension   . Migraine     Patient Active Problem List   Diagnosis Date Noted  . Bipolar disorder ([place]) 03/29/2016  . Diabetic ketoacidosis ([place]) 03/24/2016  . Dermatitis 03/24/2016  . HTN (hypertension) 03/24/2016  . HLD (hyperlipidemia) 03/24/2016  . DM (diabetes mellitus) ([place]) 03/24/2016  . Vaginal yeast infection 03/24/2016  . Hyperglycemia   . Chronic daily headache 11/23/2012  . 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 Depression 11/23/2012    Past Surgical History:  Procedure Laterality Date  . BACK SURGERY    . PARTIAL HYSTERECTOMY      OB History    No data available       Home Medications    Prior to Admission medications   Medication Sig Start Date End Date Taking? Authorizing Provider  ACCU-CHEK AVIVA  PLUS test strip TEST UP TO QID UTD 03/26/16   Historical Provider, MD  ACCU-CHEK SOFTCLIX LANCETS lancets USE UP TO QID UTD 03/26/16   Historical Provider, MD  albuterol (PROVENTIL HFA;VENTOLIN HFA) 108 (90 BASE) MCG/ACT inhaler Inhale 2 puffs into the lungs daily as needed for wheezing or shortness of breath.    Historical Provider, MD  albuterol (PROVENTIL) (2.5 MG/3ML) 0.083% nebulizer solution Take 2.5 mg by nebulization at bedtime as needed for wheezing.    Historical Provider, MD  ALPRAZolam Duanne Moron) 0.5 MG tablet Take 0.5 mg by mouth 2 (two) times daily as needed for anxiety.    Historical Provider, MD  ARIPiprazole (ABILIFY) 5 MG tablet Take 5 mg by mouth daily.  08/25/13   Historical Provider, MD  blood glucose meter kit and supplies Dispense based on patient and insurance preference. Use up to four times daily as directed. (FOR ICD-9 250.00, 250.01). 03/26/16   Geradine Girt, DO  calcium-vitamin D (OSCAL WITH D) 500-200 MG-UNIT per tablet Take 1 tablet by mouth daily with breakfast.    Historical Provider, MD  DULoxetine (CYMBALTA) 60 MG capsule Take 60 mg by mouth at bedtime.     Historical Provider, MD  fluticasone (FLONASE) 50 MCG/ACT nasal spray Place 2 sprays into both nostrils daily. 03/31/16   Julianne Rice, MD  insulin aspart (NOVOLOG) 100 UNIT/ML FlexPen TID with meals: CBG 70 - 120: 0 units CBG 121 - 150: 2 units CBG 151 - 200: 3 units CBG 201 - 250: 5 units  CBG 251 - 300: 8 units CBG 301 - 350: 11 units CBG 351 - 400: 15 units 03/27/16   Elby Beck, FNP  Insulin Glargine (LANTUS SOLOSTAR) 100 UNIT/ML Solostar Pen Inject 30 Units into the skin daily at 10 pm. 03/26/16   Geradine Girt, DO  Insulin Pen Needle 31G X 5 MM MISC For use with solostar and novolog 03/26/16   Geradine Girt, DO  irbesartan (AVAPRO) 150 MG tablet Take 1 tablet (150 mg total) by mouth daily. 03/27/16   Geradine Girt, DO  loratadine (CLARITIN) 10 MG tablet Take 1 tablet (10 mg total) by mouth daily.  03/31/16   Julianne Rice, MD  metFORMIN (GLUCOPHAGE) 500 MG tablet Take 1 tablet (500 mg total) by mouth 2 (two) times daily with a meal. 03/26/16   Geradine Girt, DO  miconazole (MICOTIN) 2 % cream Apply topically 2 (two) times daily. 03/26/16   Geradine Girt, DO  topiramate (TOPAMAX) 50 MG tablet Take 1 tablet (50 mg total) by mouth 2 (two) times daily. 03/10/13   Garvin Fila, MD  triamcinolone ointment (KENALOG) 0.1 % Apply topically 3 (three) times daily. 03/26/16   Geradine Girt, DO  zolpidem (AMBIEN) 5 MG tablet Take 5 mg by mouth at bedtime as needed for sleep.    Historical Provider, MD    Family History Family History  Problem Relation Age of Onset  . Diabetes Mother   . Stroke Father   . Cancer Paternal Aunt   . Cancer Paternal Aunt   . Diabetes Brother     Social History Social History  Substance Use Topics  . Smoking status: Former Smoker    Quit date: 12/13/2015  . Smokeless tobacco: Never Used  . Alcohol use No     Allergies   Cefazolin   Review of Systems Review of Systems Ten systems reviewed and are negative for acute change, except as noted in the HPI.    Physical Exam Updated Vital Signs BP 109/77   Pulse 64   Temp 98.5 F (36.9 C) (Oral)   Resp 16   Ht 5' (1.524 m)   Wt 95.3 kg   SpO2 99%   BMI 41.01 kg/m   Physical Exam  Constitutional: Autumn Valenzuela is oriented to person, place, and time. Autumn Valenzuela appears well-developed and well-nourished. No distress.  HENT:  Head: Normocephalic and atraumatic.  Mouth/Throat: Oropharynx is clear and moist.  Eyes: Conjunctivae and EOM are normal. Pupils are equal, round, and reactive to light. No scleral icterus.  No horizontal, vertical or rotational nystagmus  Neck: Normal range of motion. Neck supple.  Full active and passive ROM without pain No midline or paraspinal tenderness No nuchal rigidity or meningeal signs  Cardiovascular: Normal rate, regular rhythm and intact distal pulses.   Pulmonary/Chest:  Effort normal and breath sounds normal. No respiratory distress. Autumn Valenzuela has no wheezes. Autumn Valenzuela has no rales.  Abdominal: Soft. Bowel sounds are normal. There is no tenderness. There is no rebound and no guarding.  Musculoskeletal: Normal range of motion.  Lymphadenopathy:    Autumn Valenzuela has no cervical adenopathy.  Neurological: Autumn Valenzuela is alert and oriented to person, place, and time. No cranial nerve deficit. Autumn Valenzuela exhibits normal muscle tone. Coordination normal.  Mental Status:  Alert, oriented, thought content appropriate. Speech fluent without evidence of aphasia. Able to follow 2 step commands without difficulty.  Cranial Nerves:  II:  Peripheral visual fields grossly normal, pupils equal, round, reactive to light III,IV, VI: ptosis  not present, extra-ocular motions intact bilaterally  V,VII: smile symmetric, facial light touch sensation equal VIII: hearing grossly normal bilaterally  IX,X: midline uvula rise  XI: bilateral shoulder shrug equal and strong XII: midline tongue extension  Motor:  5/5 in upper and lower extremities bilaterally including strong and equal grip strength and dorsiflexion/plantar flexion Sensory: Pinprick and light touch normal in all extremities.  Cerebellar: normal finger-to-nose with bilateral upper extremities Gait: normal gait and balance CV: distal pulses palpable throughout   Skin: Skin is warm and dry. No rash noted. Autumn Valenzuela is not diaphoretic.  Psychiatric: Autumn Valenzuela has a normal mood and affect. Her behavior is normal. Judgment and thought content normal.  Nursing note and vitals reviewed.    ED Treatments / Results  Labs (all labs ordered are listed, but only abnormal results are displayed) Labs Reviewed  CBC - Abnormal; Notable for the following:       Result Value   WBC 13.7 (*)    All other components within normal limits  BASIC METABOLIC PANEL - Abnormal; Notable for the following:    Glucose, Bld 344 (*)    All other components within normal limits    EKG   EKG Interpretation None       Radiology No results found.  Procedures Procedures (including critical care time)  Medications Ordered in ED Medications  sodium chloride 0.9 % bolus 1,000 mL (1,000 mLs Intravenous New Bag/Given 04/08/16 2251)  ketorolac (TORADOL) 30 MG/ML injection 15 mg (15 mg Intravenous Given 04/08/16 2251)     Initial Impression / Assessment and Plan / ED Course  I have reviewed the triage vital signs and the nursing notes.  Pertinent labs & imaging results that were available during my care of the patient were reviewed by me and considered in my medical decision making (see chart for details).     Pt HA treated and improved while in ED.  Presentation is like pts typical HA and non concerning for [facility], ICH, Meningitis, or temporal arteritis. Pt is afebrile with no focal neuro deficits, nuchal rigidity, or change in vision. Pt is to follow up with PCP to discuss prophylactic medication. Pt verbalizes understanding and is agreeable with plan to dc.    Final Clinical Impressions(s) / ED Diagnoses   Final diagnoses:  Bad headache    New Prescriptions New Prescriptions   No medications on file     Margarita Mail, PA-C 04/09/16 0055    Daleen Bo, MD 04/09/16 1011

## 2016-04-09 ENCOUNTER — Encounter: Payer: Medicare PPO | Attending: Family Medicine | Admitting: *Deleted

## 2016-04-09 DIAGNOSIS — E1310 Other specified diabetes mellitus with ketoacidosis without coma: Secondary | ICD-10-CM | POA: Insufficient documentation

## 2016-04-09 DIAGNOSIS — Z713 Dietary counseling and surveillance: Secondary | ICD-10-CM | POA: Insufficient documentation

## 2016-04-09 DIAGNOSIS — E118 Type 2 diabetes mellitus with unspecified complications: Secondary | ICD-10-CM

## 2016-04-09 NOTE — Patient Instructions (Signed)
Plan: We discussed how each of your insulins work   Continue taking the Lantus at 10 PM each night as it works for 24 hours and gives you a base line of insulin all day and all night  Continue taking the Novolog before each meal as it helps your food move from your blood stream into your cells and the Sliding Scale gives you extra insulin when your blood sugar is too high.  We discussed the different locations you can give your insulin on your body

## 2016-04-09 NOTE — Discharge Instructions (Signed)

## 2016-04-10 ENCOUNTER — Encounter: Payer: Self-pay | Admitting: Emergency Medicine

## 2016-04-10 ENCOUNTER — Telehealth: Payer: Self-pay | Admitting: Family Medicine

## 2016-04-10 NOTE — Telephone Encounter (Signed)
Called an left voicemail for pt to return call to office. Please inform pt if she calls back that the note was approved by Jackelyn Poling and it will be completed upon my return Monday.

## 2016-04-10 NOTE — Telephone Encounter (Signed)
Patient called again asking about work note. I informed her you had talked about it and it would be completed. Just asked for a call back on her cell.

## 2016-04-10 NOTE — Telephone Encounter (Signed)
Okay to extend work note.

## 2016-04-10 NOTE — Telephone Encounter (Signed)
Patient asked that we extend her out of work note to 04/18/2016. She states that she feels she will be back on track with her insulin at that point. Verified cell # is the best #

## 2016-04-10 NOTE — Telephone Encounter (Signed)
That will be fine. Please make sure she has follow up scheduled. Also, remind her that we can complete FMLA paperwork if needed for her.

## 2016-04-11 ENCOUNTER — Telehealth: Payer: Self-pay | Admitting: Family Medicine

## 2016-04-11 ENCOUNTER — Encounter: Payer: Self-pay | Admitting: *Deleted

## 2016-04-11 NOTE — Telephone Encounter (Signed)
Made in error

## 2016-04-11 NOTE — Telephone Encounter (Signed)
Informed patient you were attempting to take care of the work note for her today. Confirmed best number to reach her is [phone]

## 2016-04-11 NOTE — Telephone Encounter (Signed)
Noted. Spoke to Jackelyn Poling today and she approved extended work note to Thursday, April 18, 2016. Work note was printed to reflect extension and signature stamp was used in Debbie's absence. Patient and Jackelyn Poling are both aware letter is ready to be picked up.

## 2016-04-19 ENCOUNTER — Ambulatory Visit: Payer: Medicare PPO | Admitting: Podiatry

## 2016-04-22 ENCOUNTER — Ambulatory Visit: Payer: Medicare PPO | Admitting: Family Medicine

## 2016-04-22 NOTE — Progress Notes (Signed)
Diabetes Self-Management Education  Visit Type: First/Initial  Appt. Start Time: 1530 Appt. End Time: 1630  04/22/2016  Ms. Gaynel Schaafsma, identified by name and date of birth, is a 56 y.o. female with a diagnosis of Diabetes: Type 2. She is newly diagnosed with type 2 Diabetes. She works at [employer] so she is fairly active at work. She has started checking her BG diaily and reports BG in the 200-300's.   ASSESSMENT  Height 5\' 1"  (1.549 m), weight 211 lb 9.6 oz (96 kg). Body mass index is 39.98 kg/m.    Individualized Plan for Diabetes Self-Management Training:   Learning Objective:  Patient will have a greater understanding of diabetes self-management. Patient education plan is to attend individual and/or group sessions per assessed needs and concerns.   Plan:   Patient Instructions  Plan: We discussed how each of your insulins work   Continue taking the Lantus at 10 PM each night as it works for 24 hours and gives you a base line of insulin all day and all night  Continue taking the Novolog before each meal as it helps your food move from your blood stream into your cells and the Sliding Scale gives you extra insulin when your blood sugar is too high.  We discussed the different locations you can give your insulin on your body  We will discuss carb counting in more detail at your next visit.   Expected Outcomes:  Demonstrated interest in learning. Expect positive outcomes  Education material provided: Living Well with Diabetes, A1C conversion sheet and Meal plan card, Diabetes medication handout  If problems or questions, patient to contact team via:  Phone and Email  Future DSME appointment: 4-6 wks to cover Carb Counting

## 2016-04-24 ENCOUNTER — Telehealth: Payer: Self-pay | Admitting: Family Medicine

## 2016-04-24 NOTE — Telephone Encounter (Signed)
Patient came in to sign paperwork, paperwork was in turned faxed out.

## 2016-04-26 ENCOUNTER — Encounter: Payer: Self-pay | Admitting: Family Medicine

## 2016-04-26 ENCOUNTER — Ambulatory Visit (INDEPENDENT_AMBULATORY_CARE_PROVIDER_SITE_OTHER): Payer: Medicare PPO | Admitting: Family Medicine

## 2016-04-26 VITALS — BP 122/78 | HR 77 | Temp 98.2°F | Wt 215.6 lb

## 2016-04-26 DIAGNOSIS — E118 Type 2 diabetes mellitus with unspecified complications: Secondary | ICD-10-CM

## 2016-04-26 DIAGNOSIS — Z794 Long term (current) use of insulin: Secondary | ICD-10-CM | POA: Diagnosis not present

## 2016-04-26 DIAGNOSIS — F329 Major depressive disorder, single episode, unspecified: Secondary | ICD-10-CM | POA: Diagnosis not present

## 2016-04-26 DIAGNOSIS — I10 Essential (primary) hypertension: Secondary | ICD-10-CM

## 2016-04-26 DIAGNOSIS — F32A Depression, unspecified: Secondary | ICD-10-CM

## 2016-04-26 NOTE — Progress Notes (Signed)
Pre visit review using our clinic review tool, if applicable. No additional management support is needed unless otherwise documented below in the visit note. 

## 2016-04-26 NOTE — Progress Notes (Signed)
Subjective:    Patient ID: Autumn Valenzuela, female    DOB: 1960/06/13, 56 y.o.   MRN: [id number]  HPI This is a 56 yo female who presents today for follow up of newly diagnosed DM type 2. She was diagnosed 03/24/2016 and started on Lantus and SSI Novolog. She has seen diabetes education RD x 1 about 2 weeks ago. She has an appointment with podiatry next week. She brings in her glucometer and blood sugars are running 140-270. Has been working hard to eat well and exercise regularly. Has had some pain at outer thigh injection sites and did not do her insulin injections yesterday.  Today she reports feeling ok. She had first cataract surgery two weeks ago and another 5 days ago, is trying to get adjusted. Is out of work until 05/01/16.  Continues to have intermittent headaches, has tried to decrease light in her house and eat more regularly. She has been having headaches 4 days of the week. Has been trying to get more and better sleep, is trying to decrease stress and walk daily. Has good days and bad days, usually about 1 crying spell a week. Is still trying to adjust to her diabetes. Is concerned about going back to work next week, feels like her staminal is decreased.  No chest pain or SOB.   Past Medical History:  Diagnosis Date  . Bipolar disorder ([place])   . CVA (cerebral infarction)   . Depression   . Diabetes mellitus without complication ([place])   . High cholesterol   . Hypertension   . Migraine    Past Surgical History:  Procedure Laterality Date  . BACK SURGERY    . PARTIAL HYSTERECTOMY     Family History  Problem Relation Age of Onset  . Diabetes Mother   . Stroke Father   . Cancer Paternal Aunt   . Cancer Paternal Aunt   . Diabetes Brother    Social History  Substance Use Topics  . Smoking status: Former Smoker    Quit date: 12/13/2015  . Smokeless tobacco: Never Used  . Alcohol use No      Review of Systems Per HPI    Objective:   Physical Exam Physical Exam   Constitutional: Oriented to person, place, and time. She appears well-developed and well-nourished.  HENT:  Head: Normocephalic and atraumatic.  Eyes: Conjunctivae are .  Neck: Normal range of motion. Neck supple.  Cardiovascular: Normal rate, regular rhythm and normal heart sounds.   Pulmonary/Chest: Effort normal and breath sounds normal.  Musculoskeletal: Normal range of motion.  Neurological: Alert and oriented to person, place, and time.  Skin: Skin is warm and dry.  Psychiatric: Normal mood and affect. Behavior is normal. Judgment and thought content normal.  Vitals reviewed.     BP 122/78 (BP Location: Right Arm, Patient Position: Sitting, Cuff Size: Large)   Pulse 77   Temp 98.2 F (36.8 C) (Oral)   Wt 215 lb 9.6 oz (97.8 kg)   SpO2 96%   BMI 40.74 kg/m  Wt Readings from Last 3 Encounters:  04/26/16 215 lb 9.6 oz (97.8 kg)  04/09/16 211 lb 9.6 oz (96 kg)  04/08/16 210 lb (95.3 kg)   Depression screen [facility] 2/9 04/22/2016 03/27/2016  Decreased Interest 0 1  Down, Depressed, Hopeless 0 1  PHQ - 2 Score 0 2  Altered sleeping - 3  Tired, decreased energy - 3  Change in appetite - 3  Feeling bad or failure about yourself  -  3  Trouble concentrating - 3  Moving slowly or fidgety/restless - 3  Suicidal thoughts - 1  PHQ-9 Score - 21  Difficult doing work/chores - Somewhat difficult       Assessment & Plan:  1. Type 2 diabetes mellitus with complication, with long-term current use of insulin (HCC) - blood sugars improved on current regime - provided written and verbal instructions about sites for injection and encouraged continued compliance with SSI/Lantus - follow up as scheduled with nutrition - follow up in 2 months, will recheck HgbA1c  2. Essential hypertension - well controlled  3. Depression, unspecified depression type - depression scale improved, she is still adjusting to new normal. Discussed coping mechanisms and encouraged increased sleep, exercise,  adherence to diet.  - follow up in 2 months  Clarene Reamer, FNP-BC  [facility] [place], [place]  04/26/2016 10:05 AM

## 2016-04-26 NOTE — Patient Instructions (Signed)
Keep up the good work with your diet and exercise! Please come back in 2 months and bring your glucose machine and/or blood sugar log  How and Where to Give Subcutaneous Insulin Injections, Adult People with type 1 diabetes must take insulin since their bodies do not make it. People with type 2 diabetes may require insulin. There are many different types of insulin as well as other injectable diabetes medicines that are meant to be injected into the fat layer under your skin. The type of insulin or injectable diabetes medicine you take may determine how many injections you give yourself and when to take the injections. Choosing a site for injection Insulin absorption varies from site to site. As with any injectable medication it is best for the insulin to be injected within the same body region. However, do not inject the insulin in the same spot each time. Rotating the spots you give your injections will prevent inflammation or tissue breakdown. There are four main regions that can be used for injections. The regions include the:  Abdomen (preferred region, especially for non-insulin injectable diabetes medicine).  Front and upper outer sides of thighs.  Back of upper arm.  Buttocks. Using a syringe and vial Drawing up insulin: single insulin dose   1. Wash your hands with soap and water. 2. Gently roll the insulin bottle (vial) between your hands to mix it. Do not shake the vial. 3. Clean the top rubber part of the vial with an alcohol wipe. Be sure that the plastic pop-top has been removed on newer vials. 4. Remove the plastic cover from the needle on the syringe. Do not let the needle touch anything. 5. Pull the plunger back to draw air into the syringe. The air should be the same amount as the insulin dose. 6. Push the needle through the rubber on the top of the vial. Do not turn the vial over. 7. Push the plunger in all the way to put the air into the vial. 8. Leave the needle in the  vial and turn the vial and syringe upside down. 9. Pull down slowly on the plunger, drawing the amount of insulin you need into the syringe. 10. Look for air bubbles in the syringe. You may need to push the plunger up and down 2 to 3 times to slowly get rid of any air bubbles in the syringe. 11. Pull back the plunger to get your correct dose. 12. Remove the needle from the vial. 13. Use an alcohol wipe to clean the area of the body to be injected. 14. Pinch up 1 inch of skin and hold it. 15. Put the needle straight into the skin (90-degree angle). Put the needle in as far as it will go (to the hub). The needle may need to be injected at a 45-degree angle in small adults with little fat. 16. When the needle is in, you can let go of your skin. 17. Push the plunger down all the way to inject the insulin. 18. Pull the needle straight out of the skin. 19. Press the alcohol wipe over the spot where you gave your injection. Keep it there for a few seconds. Do not rub the area. 20. Do not put the plastic cover back on the needle. Drawing up insulin: mixing 2 insulins  1. Wash your hands with soap and water. 2. Gently roll the vial of "cloudy" insulin between your hands or rotate the vial from top to bottom to mix. 3. Clean the  top of both vials with an alcohol wipe. Be sure that the plastic pop-top lid has been removed on newer vials. 4. Pull air into the syringe to equal the dose of "cloudy" insulin. 5. Stick the needle into the "cloudy" insulin vial and inject the air. Be sure to keep the vial upright. 6. Remove the needle from the "cloudy" insulin vial. 7. Pull air into the syringe to equal the dose of "clear" insulin. 8. Stick the needle into the "clear" insulin vial and inject the air. 9. Leave the needle in the "clear" insulin vial and turn the vial upside down. 10. Pull down on the plunger and slowly draw into the syringe the number of units of "clear" insulin desired. 11. Look for air bubbles  in the syringe. You may need to push the plunger up and down 2 to 3 times to slowly get rid of any air bubbles in the syringe. 12. Remove the needle from the "clear" insulin vial. 13. Stick the needle into the "cloudy" insulin vial. Do not inject any of the "clear" insulin into the "cloudy" vial. 14. Turn the "cloudy" vial upside down and pull the plunger down to the number of units that equals the total number of units of "clear" and "cloudy" insulins. 15. Remove the needle from the "cloudy" insulin vial. 16. Use an alcohol wipe to clean the area of the body to be injected. 17. Put the needle straight into the skin (90-degree angle). Put the needle in as far as it will go (to the hub). The needle may need to be injected at a 45-degree angle in small adults with little fat. 18. When the needle is in, you can let go of your skin. 19. Push the plunger down all the way to inject the insulin. 20. Pull the needle straight out of the skin. 21. Press the alcohol wipe over the spot where you gave your injection. Keep it there for a few seconds. Do not rub the area. 22. Do not put the plastic cover back on the needle. Using an insulin pen  1. Wash your hands with soap and water. 2. If you are using the "cloudy" insulin, roll the pen between your palms several times or rotate the pen top to bottom several times. 3. Remove the insulin pen cap. 4. Clean the rubber stopper of the cartridge with an alcohol wipe. 5. Remove the protective paper tab from the disposable needle. 6. Screw the needle onto the pen. 7. Remove the outer plastic needle cover. 8. Remove the inner plastic needle cover. 9. Prime the insulin pen by turning the button (dial) to 2 units. Hold the pen with the needle pointing up, and push the dial on the opposite end until a drop of insulin appears at the needle tip. If no insulin appears, repeat this step. 10. Dial the number of units of insulin you will inject. 11. Use an alcohol wipe to  clean the area of the body to be injected. 12. Pinch up 1 inch of skin and hold it. 13. Put the needle straight into the skin (90-degree angle). 14. Push the dial down to push the insulin into the fat tissue. 15. Count to 10 slowly. Then, remove the needle from the fat tissue. 16. Carefully replace the larger outer plastic needle cover over the needle and unscrew the capped needle. Throwing away supplies  Discard used needles in a puncture proof sharps disposal container. Follow disposal regulations for the area where you live.  Vials and  empty disposable pens may be thrown away in the regular trash. This information is not intended to replace advice given to you by your health care provider. Make sure you discuss any questions you have with your health care provider. Document Released: 03/23/2003 Document Revised: 06/08/2015 Document Reviewed: 06/09/2012 Elsevier Interactive Patient Education  2017 [employer].

## 2016-04-29 ENCOUNTER — Other Ambulatory Visit: Payer: Self-pay | Admitting: Podiatry

## 2016-04-29 ENCOUNTER — Ambulatory Visit (INDEPENDENT_AMBULATORY_CARE_PROVIDER_SITE_OTHER): Payer: Medicare PPO

## 2016-04-29 ENCOUNTER — Ambulatory Visit (INDEPENDENT_AMBULATORY_CARE_PROVIDER_SITE_OTHER): Payer: Medicare PPO | Admitting: Podiatry

## 2016-04-29 ENCOUNTER — Encounter: Payer: Self-pay | Admitting: Podiatry

## 2016-04-29 DIAGNOSIS — M722 Plantar fascial fibromatosis: Secondary | ICD-10-CM

## 2016-04-29 DIAGNOSIS — L600 Ingrowing nail: Secondary | ICD-10-CM | POA: Diagnosis not present

## 2016-04-29 MED ORDER — MELOXICAM 7.5 MG PO TABS: 7.5000 mg | ORAL_TABLET | Freq: Every day | ORAL | 0 refills | Status: DC

## 2016-04-29 NOTE — Patient Instructions (Signed)

## 2016-04-29 NOTE — Progress Notes (Signed)
   Subjective:    Patient ID: Autumn Valenzuela, female    DOB: August 15, 1960, 56 y.o.   MRN: [id number]  HPI  56 year old female presents to the office today for concerns of bilateral heel pain which has been ongoing for about 1 month. She has pain when she first gets up in the morning after being on her feet all day. She describes as a throbbing sensation. Denies any recent injury. No recent treatment. She is diabetic and her last BS she states was 137 today. She also states that she has an ingrown toenail to the right big toe which is painful at times. No swelling, drainage, pus. No other complaints today.   Review of Systems  All other systems reviewed and are negative.      Objective:   Physical Exam General: AAO x3, NAD  Dermatological: Incurvation present on the right hallux toenail without any drainage, edema, erythema or any other signs of infection. No open lesions are present,  Vascular: Dorsalis Pedis artery and Posterior Tibial artery pedal pulses are 2/4 bilateral with immedate capillary fill time.  There is no pain with calf compression, swelling, warmth, erythema.   Neruologic: Grossly intact via light touch bilateral. Vibratory intact via tuning fork bilateral. Protective threshold with Semmes Wienstein monofilament intact to all pedal sites bilateral.   Musculoskeletal: Tenderness to palpation along the plantar medial tubercle of the calcaneus at the insertion of plantar fascia on the left and right foot. There is no pain along the course of the plantar fascia within the arch of the foot. Plantar fascia appears to be intact. There is no pain with lateral compression of the calcaneus or pain with vibratory sensation. There is no pain along the course or insertion of the achilles tendon. No other areas of tenderness to bilateral lower extremities.  Muscular strength 5/5 in all groups tested bilateral. Equinus is present.   Gait: Unassisted, Nonantalgic.      Assessment &  Plan:  56 year old female with bilateral heel pain, plantar fasciitis with right hallux ingrown toenail without infection -Treatment options discussed including all alternatives, risks, and complications -Etiology of symptoms were discussed -X-rays were obtained and reviewed with the patient. No evidence of acute fractures and 5. -Patient elects to proceed with steroid injection into the left and right heel. Under sterile skin preparation, a total of 2.5cc of kenalog 10, 0.5% Marcaine plain, and 2% lidocaine plain were infiltrated into the symptomatic area without complication. A band-aid was applied. Patient tolerated the injection well without complication. Post-injection care with discussed with the patient. Discussed with the patient to ice the area over the next couple of days to help prevent a steroid flare.  -Night splint dispensed -Discussed shoe gear modifications and orthotics -Debrided the symptomatic portion of right hallux toenail any complications. Discussed the symptoms continue at need a partial nail avulsion. -RTC 3 weeks or sooner if needed.  Celesta Gentile, DPM

## 2016-04-30 ENCOUNTER — Telehealth: Payer: Self-pay | Admitting: Family Medicine

## 2016-04-30 ENCOUNTER — Telehealth: Payer: Self-pay | Admitting: *Deleted

## 2016-04-30 NOTE — Telephone Encounter (Signed)
Patient called to advise that she has been waiting for her test strips to be sent in since last week. I do not see any active script for test strips. She advised to use the same pharmacy on file.

## 2016-04-30 NOTE — Telephone Encounter (Addendum)
Pt states she received 2 injections in her heel yesterday and today the right heel is hurting. I informed pt that she should begin ice therapy at least 3-4 times daily for 15-20 minutes each session protecting the skin from the ice with fabric, take the Meloxicam as directed for the inflammation and it would also help with the pain relief. 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 I told pt I could write her off until 05/06/2016. Pt states that would help and she will pick up the note when she picks up her medications.05/06/2016-Pt states she needs to be out to take care of the left heel, it has become worse while treating the right heel last week. Dr. Berton Lan pt to be out of work until 05/13/2016, if not able to work must be seen in office prior to be written out of work. I informed pt of Dr. Leigh Aurora orders. Note written for pt to pick up.

## 2016-05-01 ENCOUNTER — Other Ambulatory Visit: Payer: Self-pay | Admitting: Family Medicine

## 2016-05-01 MED ORDER — ACCU-CHEK AVIVA PLUS VI STRP: 1.0000 | ORAL_STRIP | Freq: Four times a day (QID) | 3 refills | Status: DC | PRN

## 2016-05-01 NOTE — Telephone Encounter (Signed)
Prescription sent to pharmacy.

## 2016-05-14 ENCOUNTER — Encounter: Payer: Self-pay | Admitting: Family Medicine

## 2016-05-15 ENCOUNTER — Ambulatory Visit: Payer: Medicare PPO | Admitting: *Deleted

## 2016-05-20 ENCOUNTER — Ambulatory Visit (INDEPENDENT_AMBULATORY_CARE_PROVIDER_SITE_OTHER): Payer: Medicare PPO | Admitting: Podiatry

## 2016-05-20 ENCOUNTER — Encounter: Payer: Self-pay | Admitting: Podiatry

## 2016-05-20 DIAGNOSIS — M722 Plantar fascial fibromatosis: Secondary | ICD-10-CM

## 2016-05-20 MED ORDER — TRIAMCINOLONE ACETONIDE 10 MG/ML IJ SUSP
10.0000 mg | Freq: Once | INTRAMUSCULAR | Status: AC
  Administered 2016-05-20: 10 mg

## 2016-05-23 NOTE — Progress Notes (Signed)
Subjective: Autumn Valenzuela presents to the office today for follow-up evaluation of bilateral heel pain with the R>>L. She states that the pain on the left has resolved. She still gets pain on the right foot, although improved. They have been icing, stretching, try to wear supportive shoe as much as possible. No other complaints at this time. No acute changes since last appointment. They deny any systemic complaints such as fevers, chills, nausea, vomiting.  Objective: General: AAO x3, NAD  Dermatological: Skin is warm, dry and supple bilateral. Nails x 10 are well manicured; remaining integument appears unremarkable at this time. There are no open sores, no preulcerative lesions, no rash or signs of infection present.  Vascular: Dorsalis Pedis artery and Posterior Tibial artery pedal pulses are 2/4 bilateral with immedate capillary fill time. Pedal hair growth present. There is no pain with calf compression, swelling, warmth, erythema.   Neruologic: Grossly intact via light touch bilateral. Vibratory intact via tuning fork bilateral. Protective threshold with Semmes Wienstein monofilament intact to all pedal sites bilateral.   Musculoskeletal: There is improved but continued tenderness palpation along the plantar medial tubercle of the calcaneus at the insertion of the plantar fascia on the right foot. There is no pain on the left foot. There is  no pain along the course of the plantar fascia within the arch of the foot. Plantar fascia appears to be intact bilaterally. There is no pain with lateral compression of the calcaneus and there is no pain with vibratory sensation. There is no pain along the course or insertion of the Achilles tendon. There are no other areas of tenderness to bilateral lower extremities. No gross boney pedal deformities bilateral. No pain, crepitus, or limitation noted with foot and ankle range of motion bilateral. Muscular strength 5/5 in all groups tested  bilateral.  Gait: Unassisted, Nonantalgic.   Assessment: Presents for follow-up evaluation for heel pain, likely plantar fasciitis   Plan: -Treatment options discussed including all alternatives, risks, and complications -Patient elects to proceed with steroid injection into the right heel. Under sterile skin preparation, a total of 2.5cc of kenalog 10, 0.5% Marcaine plain, and 2% lidocaine plain were infiltrated into the symptomatic area without complication. A band-aid was applied. Patient tolerated the injection well without complication. Post-injection care with discussed with the patient. Discussed with the patient to ice the area over the next couple of days to help prevent a steroid flare.  -Continue plantar fascial braces.  -Ice and stretching exercises on a daily basis. -Continue supportive shoe gear. Discussed orthotics.  -Follow-up as scheduled or sooner if any problems arise. In the meantime, encouraged to call the office with any questions, concerns, change in symptoms.   Celesta Gentile, DPM

## 2016-06-17 ENCOUNTER — Ambulatory Visit: Payer: Medicare PPO | Admitting: Podiatry

## 2016-06-24 ENCOUNTER — Encounter: Payer: Self-pay | Admitting: Podiatry

## 2016-06-24 ENCOUNTER — Ambulatory Visit (INDEPENDENT_AMBULATORY_CARE_PROVIDER_SITE_OTHER): Payer: Medicare PPO | Admitting: Podiatry

## 2016-06-24 DIAGNOSIS — M722 Plantar fascial fibromatosis: Secondary | ICD-10-CM | POA: Diagnosis not present

## 2016-06-24 MED ORDER — TRIAMCINOLONE ACETONIDE 10 MG/ML IJ SUSP: 10.0000 mg | Freq: Once | INTRAMUSCULAR | Status: DC

## 2016-06-24 NOTE — Progress Notes (Signed)
Subjective:    Patient ID: Autumn Valenzuela, female   DOB: 56 y.o.   MRN: [id number]   HPI patient states she still developing pain in the plantar aspect of the right heel with the left one doing better at the current time    ROS      Objective:  Physical Exam neurovascular status intact with exquisite discomfort plantar aspect right heel     Assessment:  Plantar fasciitis       Plan:  Inject the plantar fascia right 3 mg Kenalog 5 mill grams Xylocaine with no iatrogenic bleeding noted

## 2016-06-26 ENCOUNTER — Ambulatory Visit (INDEPENDENT_AMBULATORY_CARE_PROVIDER_SITE_OTHER): Payer: Medicare PPO

## 2016-06-26 ENCOUNTER — Ambulatory Visit (INDEPENDENT_AMBULATORY_CARE_PROVIDER_SITE_OTHER): Payer: Medicare PPO | Admitting: Family Medicine

## 2016-06-26 ENCOUNTER — Telehealth: Payer: Self-pay | Admitting: Emergency Medicine

## 2016-06-26 ENCOUNTER — Encounter: Payer: Self-pay | Admitting: Family Medicine

## 2016-06-26 ENCOUNTER — Other Ambulatory Visit: Payer: Self-pay | Admitting: Emergency Medicine

## 2016-06-26 VITALS — BP 124/80 | HR 72 | Temp 98.1°F | Wt 215.2 lb

## 2016-06-26 DIAGNOSIS — E118 Type 2 diabetes mellitus with unspecified complications: Secondary | ICD-10-CM

## 2016-06-26 DIAGNOSIS — Z794 Long term (current) use of insulin: Secondary | ICD-10-CM

## 2016-06-26 DIAGNOSIS — F419 Anxiety disorder, unspecified: Secondary | ICD-10-CM | POA: Diagnosis not present

## 2016-06-26 DIAGNOSIS — M25551 Pain in right hip: Secondary | ICD-10-CM

## 2016-06-26 DIAGNOSIS — I10 Essential (primary) hypertension: Secondary | ICD-10-CM | POA: Diagnosis not present

## 2016-06-26 DIAGNOSIS — F3170 Bipolar disorder, currently in remission, most recent episode unspecified: Secondary | ICD-10-CM

## 2016-06-26 LAB — POCT GLYCOSYLATED HEMOGLOBIN (HGB A1C): HEMOGLOBIN A1C: 7.9

## 2016-06-26 MED ORDER — ALPRAZOLAM 0.5 MG PO TABS: 0.5000 mg | ORAL_TABLET | Freq: Every day | ORAL | 2 refills | Status: DC | PRN

## 2016-06-26 MED ORDER — INSULIN PEN NEEDLE 31G X 5 MM MISC: 1 refills | Status: DC

## 2016-06-26 NOTE — Patient Instructions (Signed)
Your blood sugar looks great! Your hemoglobin A1C is down to 7.9%  Stop your sliding scale insulin  If your blood sugar at night is less than 150, decrease your night time insulin by 2 units  Follow up in 3 months- keep up the great work!   Hip Exercises Ask your health care provider which exercises are safe for you. Do exercises exactly as told by your health care provider and adjust them as directed. It is normal to feel mild stretching, pulling, tightness, or discomfort as you do these exercises, but you should stop right away if you feel sudden pain or your pain gets worse.Do not begin these exercises until told by your health care provider. STRETCHING AND RANGE OF MOTION EXERCISES These exercises warm up your muscles and joints and improve the movement and flexibility of your hip. These exercises also help to relieve pain, numbness, and tingling. Exercise A: Hamstrings, Supine  1. Lie on your back. 2. Loop a belt or towel over the ball of your left / rightfoot. The ball of your foot is on the walking surface, right under your toes. 3. Straighten your left / rightknee and slowly pull on the belt to raise your leg. ? Do not let your left / right knee bend while you do this. ? Keep your other leg flat on the floor. ? Raise the left / right leg until you feel a gentle stretch behind your left / right knee or thigh. 4. Hold this position for __________ seconds. 5. Slowly return your leg to the starting position. Repeat __________ times. Complete this stretch __________ times a day. Exercise B: Hip Rotators  1. Lie on your back on a firm surface. 2. Hold your left / right knee with your left / right hand. Hold your ankle with your other hand. 3. Gently pull your left / right knee and rotate your lower leg toward your other shoulder. ? Pull until you feel a stretch in your buttocks. ? Keep your hips and shoulders firmly planted while you do this stretch. 4. Hold this position for  __________ seconds. Repeat __________ times. Complete this stretch __________ times a day. Exercise C: V-Sit (Hamstrings and Adductors)  1. Sit on the floor with your legs extended in a large "V" shape. Keep your knees straight during this exercise. 2. Start with your head and chest upright, then bend at your waist to reach for your left foot (position A). You should feel a stretch in your right inner thigh. 3. Hold this position for __________ seconds. Then slowly return to the upright position. 4. Bend at your waist to reach forward (position B). You should feel a stretch behind both of your thighs and knees. 5. Hold this position for __________ seconds. Then slowly return to the upright position. 6. Bend at your waist to reach for your right foot (position C). You should feel a stretch in your left inner thigh. 7. Hold this position for __________ seconds. Then slowly return to the upright position. Repeat __________ times. Complete this stretch __________ times a day. Exercise D: Lunge (Hip Flexors)  1. Place your left / right knee on the floor and bend your other knee so that is directly over your ankle. You should be half-kneeling. 2. Keep good posture with your head over your shoulders. 3. Tighten your buttocks to point your tailbone downward. This helps your back to keep from arching too much. 4. You should feel a gentle stretch in the front of your left /  right thigh and hip. If you do not feel any resistance, slightly slide your other foot forward and then slowly lunge forward so your knee once again lines up over your ankle. 5. Make sure your tailbone continues to point downward. 6. Hold this position for __________ seconds. Repeat __________ times. Complete this stretch __________ times a day. STRENGTHENING EXERCISES These exercises build strength and endurance in your hip. Endurance is the ability to use your muscles for a long time, even after they get tired. Exercise E: Bridge  (Hip Extensors)  1. Lie on your back on a firm surface with your knees bent and your feet flat on the floor. 2. Tighten your buttocks muscles and lift your bottom off the floor until the trunk of your body is level with your thighs. ? Do not arch your back. ? You should feel the muscles working in your buttocks and the back of your thighs. If you do not feel these muscles, slide your feet 1-2 inches (2.5-5 cm) farther away from your buttocks. 3. Hold this position for __________ seconds. 4. Slowly lower your hips to the starting position. 5. Let your muscles relax completely between repetitions. 6. If this exercise is too easy, try doing it with your arms crossed over your chest. Repeat __________ times. Complete this exercise __________ times a day. Exercise F: Straight Leg Raises - Hip Abductors  1. Lie on your side with your left / right leg in the top position. Lie so your head, shoulder, knee, and hip line up with each other. You may bend your bottom knee to help you balance. 2. Roll your hips slightly forward, so your hips are stacked directly over each other and your left / right knee is facing forward. 3. Leading with your heel, lift your top leg 4-6 inches (10-15 cm). You should feel the muscles in your outer hip lifting. ? Do not let your foot drift forward. ? Do not let your knee roll toward the ceiling. 4. Hold this position for __________ seconds. 5. Slowly return to the starting position. 6. Let your muscles relax completely between repetitions. Repeat __________ times. Complete this exercise __________ times a day. Exercise G: Straight Leg Raises - Hip Adductors  1. Lie on your side with your left / right leg in the bottom position. Lie so your head, shoulder, knee, and hip line up. You may place your upper foot in front to help you balance. 2. Roll your hips slightly forward, so your hips are stacked directly over each other and your left / right knee is facing  forward. 3. Tense the muscles in your inner thigh and lift your bottom leg 4-6 inches (10-15 cm). 4. Hold this position for __________ seconds. 5. Slowly return to the starting position. 6. Let your muscles relax completely between repetitions. Repeat __________ times. Complete this exercise __________ times a day. Exercise H: Straight Leg Raises - Quadriceps  1. Lie on your back with your left / right leg extended and your other knee bent. 2. Tense the muscles in the front of your left / right thigh. When you do this, you should see your kneecap slide up or see increased dimpling just above your knee. 3. Tighten these muscles even more and raise your leg 4-6 inches (10-15 cm) off the floor. 4. Hold this position for __________ seconds. 5. Keep these muscles tense as you lower your leg. 6. Relax the muscles slowly and completely between repetitions. Repeat __________ times. Complete this exercise __________ times  a day. Exercise I: Hip Abductors, Standing 1. Tie one end of a rubber exercise band or tubing to a secure surface, such as a table or pole. 2. Loop the other end of the band or tubing around your left / right ankle. 3. Keeping your ankle with the band or tubing directly opposite of the secured end, step away until there is tension in the tubing or band. Hold onto a chair as needed for balance. 4. Lift your left / right leg out to your side. While you do this: ? Keep your back upright. ? Keep your shoulders over your hips. ? Keep your toes pointing forward. ? Make sure to use your hip muscles to lift your leg. Do not "throw" your leg or tip your body to lift your leg. 5. Hold this position for __________ seconds. 6. Slowly return to the starting position. Repeat __________ times. Complete this exercise __________ times a day. Exercise J: Squats (Quadriceps) 1. Stand in a door frame so your feet and knees are in line with the frame. You may place your hands on the frame for  balance. 2. Slowly bend your knees and lower your hips like you are going to sit in a chair. ? Keep your lower legs in a straight-up-and-down position. ? Do not let your hips go lower than your knees. ? Do not bend your knees lower than told by your health care provider. ? If your hip pain increases, do not bend as low. 3. Hold this position for ___________ seconds. 4. Slowly push with your legs to return to standing. Do not use your hands to pull yourself to standing. Repeat __________ times. Complete this exercise __________ times a day. This information is not intended to replace advice given to you by your health care provider. Make sure you discuss any questions you have with your health care provider. Document Released: 01/18/2005 Document Revised: 09/25/2015 Document Reviewed: 12/26/2014 Elsevier Interactive Patient Education  [employer].

## 2016-06-26 NOTE — Progress Notes (Signed)
Subjective:    Patient ID: Autumn Valenzuela, female    DOB: 27-Apr-1960, 56 y.o.   MRN: [id number]  HPI This is a 56 yo female who presents today for follow up if DM type 2. Blood sugars running 70-120s. No hypoglycemia. Checking blood sugar 3x/day, is rarely requiring SSI. Still taking lantus 30 units at bedtime.  Walking or going to gym every day, has stopped drinking soda and eating sweets. No symptoms of hypoglycemia.   Having right hip pain for several months, pain at night and after walking for several hours, not very stiff in the mornings. Taking epson salt baths. Has not tried ibuprofen or acetaminophen.   Was seen at [facility] for her xanax and cymbalta. She thinks facility has closed. Had received counseling but is now talking with her pastor. Has been off xanax for about a month, still taking cymbalta. Xanax worked well for her in past, was prescribed 2x/day. Sleeping 3-4 hours at a time, tries to be available to help with her granddaughter. Works third shift. Not very agitated or anxious righ tnow. Feels like she is in a good place with things.   Past Medical History:  Diagnosis Date  . Bipolar disorder ([place])   . CVA (cerebral infarction)   . Depression   . Diabetes mellitus without complication ([place])   . High cholesterol   . Hypertension   . Migraine    Past Surgical History:  Procedure Laterality Date  . BACK SURGERY    . PARTIAL HYSTERECTOMY     Family History  Problem Relation Age of Onset  . Diabetes Mother   . Stroke Father   . Cancer Paternal Aunt   . Cancer Paternal Aunt   . Diabetes Brother    Social History  Substance Use Topics  . Smoking status: Former Smoker    Quit date: 12/13/2015  . Smokeless tobacco: Never Used  . Alcohol use No      Review of Systems Per HPI    Objective:   Physical Exam  Constitutional: She is oriented to person, place, and time. She appears well-developed and well-nourished. No distress.  Obese.   HENT:  Head:  Normocephalic and atraumatic.  Cardiovascular: Normal rate, regular rhythm and normal heart sounds.   Pulmonary/Chest: Effort normal and breath sounds normal.  Musculoskeletal:       Right hip: She exhibits tenderness (with deep palpation over right gluteus). She exhibits normal range of motion, normal strength and no bony tenderness.  Good ROM, some pain with internal rotation.   Neurological: She is alert and oriented to person, place, and time.  Skin: Skin is warm and dry. She is not diaphoretic.  Psychiatric: She has a normal mood and affect. Her behavior is normal. Judgment and thought content normal.  Vitals reviewed.     BP 124/80 (BP Location: Right Arm, Patient Position: Sitting, Cuff Size: Large)   Pulse 72   Temp 98.1 F (36.7 C) (Oral)   Wt 215 lb 3.2 oz (97.6 kg)   SpO2 99%   BMI 40.66 kg/m  Wt Readings from Last 3 Encounters:  06/26/16 215 lb 3.2 oz (97.6 kg)  04/26/16 215 lb 9.6 oz (97.8 kg)  04/09/16 211 lb 9.6 oz (96 kg)   Results for orders placed or performed in visit on 06/26/16  POCT glycosylated hemoglobin (Hb A1C)  Result Value Ref Range   Hemoglobin A1C 7.9        Assessment & Plan:  1. Type 2 diabetes mellitus  with complication, with long-term current use of insulin (HCC) - POCT glycosylated hemoglobin (Hb A1C) - Drastic improvement in HgbA1C- she has gone from >15 to 7.9! - she was instructed to stop SSI and given instructions for decreasing Lantus by 2 units if glucose is less than 54  2. Right hip pain - DG HIP UNILAT W OR W/O PELVIS 2-3 VIEWS RIGHT; Future - discussed OTC acetaminophen or ibuprofen, provided instructions for hip strengthening exercises  3. Anxiety - discussed occasional use of xanax, up to 3x/ per week, reviewed potential for dependence.  - alprazolam 0.5 mg #30 with 2 refills  4. Essential hypertension - good control on current meds  5. Bipolar disorder in full remission, most recent episode unspecified type ([place]) -  continue cymbalta, she is currently stable, will refer to behavioral health   - follow up in 3 months  Clarene Reamer, FNP-BC  [facility]  06/26/2016 1:30 PM

## 2016-06-26 NOTE — Telephone Encounter (Signed)
Spoke with patient informing her that I sent the refill in electronically. Patient verbalized understanding nothing further needed at this time.

## 2016-06-26 NOTE — Telephone Encounter (Signed)
Patient had questions about her needles 30 units that need to be sent to  [facility], [place] [phone] (Phone) [phone] (Fax)   Transferred call to [place] to advise.

## 2016-07-03 ENCOUNTER — Other Ambulatory Visit: Payer: Self-pay | Admitting: *Deleted

## 2016-07-03 MED ORDER — INSULIN PEN NEEDLE 31G X 5 MM MISC: 1 refills | Status: DC

## 2016-08-23 ENCOUNTER — Telehealth: Payer: Self-pay | Admitting: Family Medicine

## 2016-08-23 NOTE — Telephone Encounter (Signed)
Please Advise

## 2016-08-23 NOTE — Telephone Encounter (Signed)
Patient called in reference to having "itchy rash" on neck and in bend of both arms and now has welts. Patient stated this has been going on for the last 3-4 days. Patient stated it burns as well. I informed patient that Jackelyn Poling might want to have her come in for an appointment about this. Patient would like to know if this would have anything to do with her insulin and if there is anything she can take to relieve discomfort for now. Please call patient and advise. OK to leave message.

## 2016-08-23 NOTE — Telephone Encounter (Signed)
Called and spoke with patient, has small amount red, itchy rash on bilateral popliteal fossa and side of neck. She has some miconazole at home and will try small amount BID. If not better by Monday, come in for appointment. She was instructed to keep area clean and dry. She denies any new soap/detergents/shampoos, etc.  Blood sugars in 80s. I instructed her to decrease lantus to 15 units daily from 30 units.

## 2016-09-06 ENCOUNTER — Encounter: Payer: Self-pay | Admitting: Family Medicine

## 2016-09-06 ENCOUNTER — Ambulatory Visit (INDEPENDENT_AMBULATORY_CARE_PROVIDER_SITE_OTHER): Payer: Medicare PPO | Admitting: Family Medicine

## 2016-09-06 VITALS — BP 124/78 | HR 83 | Temp 97.5°F | Wt 212.2 lb

## 2016-09-06 DIAGNOSIS — K529 Noninfective gastroenteritis and colitis, unspecified: Secondary | ICD-10-CM | POA: Diagnosis not present

## 2016-09-06 DIAGNOSIS — E118 Type 2 diabetes mellitus with unspecified complications: Secondary | ICD-10-CM | POA: Diagnosis not present

## 2016-09-06 DIAGNOSIS — Z794 Long term (current) use of insulin: Secondary | ICD-10-CM

## 2016-09-06 NOTE — Patient Instructions (Signed)

## 2016-09-06 NOTE — Progress Notes (Signed)
   Subjective:    Patient ID: Autumn Valenzuela, female    DOB: 08/09/1960, 56 y.o.   MRN: [id number]  HPI This is a 56 yo female who presents today with 3 days of diarrhea and nausea. Her granddaughter had similar symptoms. Diarrhea x 6-7 times today, no vomiting today. Stomach is rumbling. Felt feverish. Has been having gingerale, gatorade, ice chips, apple juice. Bowel movements watery, brown, no blood. Feels like everything going right through her. Blood sugars 80-120. No chest pain, no SOB, no dizziness or weakness.   Past Medical History:  Diagnosis Date  . Bipolar disorder ([place])   . CVA (cerebral infarction)   . Depression   . Diabetes mellitus without complication ([place])   . High cholesterol   . Hypertension   . Migraine    Past Surgical History:  Procedure Laterality Date  . BACK SURGERY    . PARTIAL HYSTERECTOMY     Family History  Problem Relation Age of Onset  . Diabetes Mother   . Stroke Father   . Cancer Paternal Aunt   . Cancer Paternal Aunt   . Diabetes Brother    Social History  Substance Use Topics  . Smoking status: Former Smoker    Quit date: 12/13/2015  . Smokeless tobacco: Never Used  . Alcohol use No      Review of Systems Per HPI    Objective:   Physical Exam  Constitutional: She is oriented to person, place, and time. She appears well-developed and well-nourished. No distress.  HENT:  Head: Normocephalic and atraumatic.  Eyes: Conjunctivae are normal.  Cardiovascular: Normal rate, regular rhythm and normal heart sounds.   Pulmonary/Chest: Effort normal and breath sounds normal.  Abdominal: Soft. She exhibits no distension. Bowel sounds are increased. There is no tenderness. There is no rebound and no guarding.  Neurological: She is alert and oriented to person, place, and time.  Skin: Skin is warm and dry. She is not diaphoretic.  Psychiatric: She has a normal mood and affect. Her behavior is normal. Judgment and thought content normal.    Vitals reviewed.     BP 124/78 (BP Location: Right Arm, Patient Position: Sitting, Cuff Size: Large)   Pulse 83   Temp (!) 97.5 F (36.4 C) (Oral)   Wt 212 lb 3.2 oz (96.3 kg)   SpO2 97%   BMI 40.09 kg/m  Wt Readings from Last 3 Encounters:  09/06/16 212 lb 3.2 oz (96.3 kg)  06/26/16 215 lb 3.2 oz (97.6 kg)  04/26/16 215 lb 9.6 oz (97.8 kg)      Assessment & Plan:  1. Gastroenteritis -Provided written and verbal information regarding diagnosis and treatment. - RTC/ED precautions reviewed - encouraged her to drink small amounts frequently, advance diet slowly  2. Type 2 diabetes mellitus with complication, with long-term current use of insulin ([place]) - she will hold her insulin tonight and if blood sugar less than 120 subsequent nights, will hold  - follow up as scheduled next month  Clarene Reamer, FNP-BC  [facility] [place]  09/06/2016 3:58 PM

## 2016-09-27 ENCOUNTER — Ambulatory Visit: Payer: Medicare PPO | Admitting: Family Medicine

## 2016-10-16 ENCOUNTER — Other Ambulatory Visit: Payer: Self-pay

## 2016-10-16 MED ORDER — INSULIN GLARGINE 100 UNIT/ML SOLOSTAR PEN: 30.0000 [IU] | PEN_INJECTOR | Freq: Every day | SUBCUTANEOUS | 2 refills | Status: DC

## 2016-10-16 NOTE — Telephone Encounter (Signed)
Pt out of lantus and request refill to [facility]. Seen 06/26/16. Pt will cb to schedule appt. Refill per protocol and pt voiced understanding.

## 2016-10-30 ENCOUNTER — Encounter: Payer: Self-pay | Admitting: Family Medicine

## 2016-10-30 ENCOUNTER — Encounter: Payer: Self-pay | Admitting: Emergency Medicine

## 2016-10-30 ENCOUNTER — Ambulatory Visit (INDEPENDENT_AMBULATORY_CARE_PROVIDER_SITE_OTHER): Payer: Medicare PPO | Admitting: Family Medicine

## 2016-10-30 VITALS — Temp 98.6°F | Wt 215.0 lb

## 2016-10-30 DIAGNOSIS — J069 Acute upper respiratory infection, unspecified: Secondary | ICD-10-CM | POA: Diagnosis not present

## 2016-10-30 DIAGNOSIS — Z794 Long term (current) use of insulin: Secondary | ICD-10-CM | POA: Diagnosis not present

## 2016-10-30 DIAGNOSIS — E118 Type 2 diabetes mellitus with unspecified complications: Secondary | ICD-10-CM

## 2016-10-30 LAB — HEMOGLOBIN A1C: HEMOGLOBIN A1C: 6.7 % — AB (ref 4.6–6.5)

## 2016-10-30 MED ORDER — DOXYCYCLINE HYCLATE 100 MG PO CAPS: 100.0000 mg | ORAL_CAPSULE | Freq: Two times a day (BID) | ORAL | 0 refills | Status: DC

## 2016-10-30 MED ORDER — ALBUTEROL SULFATE HFA 108 (90 BASE) MCG/ACT IN AERS: 2.0000 | INHALATION_SPRAY | Freq: Every day | RESPIRATORY_TRACT | 2 refills | Status: DC | PRN

## 2016-10-30 MED ORDER — ACCU-CHEK SOFTCLIX LANCETS MISC: 0 refills | Status: DC

## 2016-10-30 NOTE — Patient Instructions (Signed)
Use nasal spray at bedtime for next couple of weeks  Use Albuterol inhaler every 4-6 hours as needed for wheeze or cough  Can take mucinex (plain, generic is fine) to help loosen your phlegm  For cough use Delsym (generic is fine)  If not better in 3-5 days, start antibiotic

## 2016-10-30 NOTE — Progress Notes (Signed)
Subjective:    Patient ID: Autumn Valenzuela, female    DOB: Dec 18, 1960, 56 y.o.   MRN: [id number]  HPI This is a 56 yo female who presents today with cough productive of large amount green mucus, runny nose, sneeze, ears stopped up and hurting, sore throat. No known sick contacts. Has been using her inhaler, Feels SOB, hearing wheezing, cough worse with lying down. Has not been using fluticasone.  Blood sugar running 100s. Taking 17 units insulin at bedtime. Has gotten off sliding scale and has been decreasing lantus. Continues to go to gym and make healthy food choices.   Past Medical History:  Diagnosis Date  . Bipolar disorder ([place])   . CVA (cerebral infarction)   . Depression   . Diabetes mellitus without complication ([place])   . High cholesterol   . Hypertension   . Migraine    Past Surgical History:  Procedure Laterality Date  . BACK SURGERY    . PARTIAL HYSTERECTOMY     Family History  Problem Relation Age of Onset  . Diabetes Mother   . Stroke Father   . Cancer Paternal Aunt   . Cancer Paternal Aunt   . Diabetes Brother    Social History  Substance Use Topics  . Smoking status: Former Smoker    Quit date: 12/13/2015  . Smokeless tobacco: Never Used  . Alcohol use No      Review of Systems Per HPI    Objective:   Physical Exam  Constitutional: She is oriented to person, place, and time. She appears well-developed and well-nourished. No distress.  HENT:  Head: Normocephalic and atraumatic.  Right Ear: Tympanic membrane, external ear and ear canal normal.  Left Ear: Tympanic membrane, external ear and ear canal normal.  Nose: Mucosal edema and rhinorrhea present.  Mouth/Throat: Uvula is midline, oropharynx is clear and moist and mucous membranes are normal.  Eyes: Conjunctivae are normal. Right eye exhibits discharge. Left eye exhibits discharge.  Watery discharge   Neck: Normal range of motion. Neck supple.  Cardiovascular: Normal rate, regular rhythm  and normal heart sounds.   Pulmonary/Chest: Effort normal and breath sounds normal.  Lymphadenopathy:    She has no cervical adenopathy.  Neurological: She is alert and oriented to person, place, and time.  Skin: Skin is warm and dry. She is not diaphoretic.  Psychiatric: She has a normal mood and affect. Her behavior is normal. Judgment and thought content normal.  Vitals reviewed.     Temp 98.6 F (37 C) (Oral)   Wt 215 lb (97.5 kg)   SpO2 97%   BMI 40.62 kg/m BP 116/70 Wt Readings from Last 3 Encounters:  10/30/16 215 lb (97.5 kg)  09/06/16 212 lb 3.2 oz (96.3 kg)  06/26/16 215 lb 3.2 oz (97.6 kg)   . BP Readings from Last 3 Encounters:  09/06/16 124/78  06/26/16 124/80  04/26/16 122/78       Assessment & Plan:  1. URI with cough and congestion - likely viral, provided written and verbal instructions for symptomatic relief, RTC precautions. Given history of diabetes and reactive airway, provided written antibiotic prescription for her to take if not better in a couple of days - albuterol (PROVENTIL HFA;VENTOLIN HFA) 108 (90 Base) MCG/ACT inhaler; Inhale 2 puffs into the lungs daily as needed for wheezing or shortness of breath.  Dispense: 18 g; Refill: 2 - doxycycline (VIBRAMYCIN) 100 MG capsule; Take 1 capsule (100 mg total) by mouth 2 (two) times daily.  Dispense: 14 capsule; Refill: 0  2. Type 2 diabetes mellitus with complication, with long-term current use of insulin (HCC) - Hemoglobin A1c - discussed continuing to reduce Lantus when blood sugar under 200 - ACCU-CHEK SOFTCLIX LANCETS lancets; USE UP TO QID UTD  Dispense: 100 each; Refill: 0 - follow up in 6 months for CPE  Clarene Reamer, FNP-BC  [facility] at [facility], [place]  10/31/2016 4:48 PM

## 2017-04-12 ENCOUNTER — Ambulatory Visit (HOSPITAL_COMMUNITY)
Admission: EM | Admit: 2017-04-12 | Discharge: 2017-04-12 | Disposition: A | Discharge status: Home / Self Care | Payer: Medicare PPO | Attending: Radiology | Admitting: Radiology

## 2017-04-12 ENCOUNTER — Other Ambulatory Visit: Payer: Self-pay

## 2017-04-12 ENCOUNTER — Encounter (HOSPITAL_COMMUNITY): Payer: Self-pay | Admitting: Emergency Medicine

## 2017-04-12 DIAGNOSIS — M25522 Pain in left elbow: Secondary | ICD-10-CM | POA: Diagnosis not present

## 2017-04-12 MED ORDER — NAPROXEN 500 MG PO TABS: 500.0000 mg | ORAL_TABLET | Freq: Two times a day (BID) | ORAL | 0 refills | Status: DC

## 2017-04-12 NOTE — ED Triage Notes (Signed)
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  Last night she started having pain in her left elbow that has radiated up and down the arm today.  She denies any injury to the arm.

## 2017-04-12 NOTE — ED Provider Notes (Signed)
[facility]    CSN: [id number] Arrival date & time: 04/12/17  1256     History   Chief Complaint Chief Complaint  Patient presents with  . Arm Pain    left    HPI Autumn Valenzuela is a 57 y.o. female.   57 y.o. female presents with left elbow pain X 2 weeks. Condition is acute in nature. Condition is made better by nothing. Condition is made worse by certain movement. Patient denies any relief from ice and ben gay applied prior to there arrival at this facility. Patient endorses radiation of pain down to wrist and up to arm. Denies and trauma. Patient has a history of shoulder surgeries to the left arm       Past Medical History:  Diagnosis Date  . Bipolar disorder ([place])   . CVA (cerebral infarction)   . Depression   . Diabetes mellitus without complication ([place])   . High cholesterol   . Hypertension   . Migraine     Patient Active Problem List   Diagnosis Date Noted  . Bipolar disorder ([place]) 03/29/2016  . Diabetic ketoacidosis ([place]) 03/24/2016  . Dermatitis 03/24/2016  . HTN (hypertension) 03/24/2016  . HLD (hyperlipidemia) 03/24/2016  . DM (diabetes mellitus) ([place]) 03/24/2016  . Vaginal yeast infection 03/24/2016  . Hyperglycemia   . Chronic daily headache 11/23/2012  . 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 Depression 11/23/2012    Past Surgical History:  Procedure Laterality Date  . BACK SURGERY    . PARTIAL HYSTERECTOMY      OB History   None      Home Medications    Prior to Admission medications   Medication Sig Start Date End Date Taking? Authorizing Provider  ACCU-CHEK AVIVA PLUS test strip 1 each by Other route 4 (four) times daily as needed for other. Use as instructed 05/01/16  Yes Elby Beck, FNP  ACCU-CHEK SOFTCLIX LANCETS lancets USE UP TO QID UTD 10/30/16  Yes Elby Beck, FNP  albuterol (PROVENTIL HFA;VENTOLIN HFA) 108 (90 Base) MCG/ACT inhaler Inhale 2 puffs into the lungs  daily as needed for wheezing or shortness of breath. 10/30/16  Yes Elby Beck, FNP  albuterol (PROVENTIL) (2.5 MG/3ML) 0.083% nebulizer solution Take 2.5 mg by nebulization at bedtime as needed for wheezing.   Yes [provider]  ALPRAZolam Duanne Moron) 0.5 MG tablet Take 1 tablet (0.5 mg total) by mouth daily as needed for anxiety. 06/26/16  Yes Elby Beck, FNP  ARIPiprazole (ABILIFY) 5 MG tablet Take 5 mg by mouth daily.  08/25/13  Yes [provider]  blood glucose meter kit and supplies Dispense based on patient and insurance preference. Use up to four times daily as directed. (FOR ICD-9 250.00, 250.01). 03/26/16  Yes Vann, Jessica U, DO  calcium-vitamin D (OSCAL WITH D) 500-200 MG-UNIT per tablet Take 1 tablet by mouth daily with breakfast.   Yes [provider]  doxycycline (VIBRAMYCIN) 100 MG capsule Take 1 capsule (100 mg total) by mouth 2 (two) times daily. 10/30/16  Yes Elby Beck, FNP  DULoxetine (CYMBALTA) 60 MG capsule Take 60 mg by mouth at bedtime.    Yes [provider]  fluticasone (FLONASE) 50 MCG/ACT nasal spray Place 2 sprays into both nostrils daily. 03/31/16  Yes Julianne Rice, MD  insulin aspart (NOVOLOG) 100 UNIT/ML FlexPen TID with meals: CBG 70 - 120: 0 units CBG 121 - 150: 2 units CBG 151 - 200: 3 units  CBG 201 - 250: 5 units CBG 251 - 300: 8 units CBG 301 - 350: 11 units CBG 351 - 400: 15 units 03/27/16  Yes Elby Beck, FNP  Insulin Glargine (LANTUS SOLOSTAR) 100 UNIT/ML Solostar Pen Inject 30 Units into the skin daily at 10 pm. 10/16/16  Yes Elby Beck, FNP  Insulin Pen Needle 31G X 5 MM MISC For use with solostar and novolog 07/03/16  Yes Elby Beck, FNP  irbesartan (AVAPRO) 150 MG tablet Take 1 tablet (150 mg total) by mouth daily. 03/27/16  Yes Geradine Girt, DO  ketorolac (ACULAR) 0.4 % SOLN  04/08/16  Yes [provider]  loratadine (CLARITIN) 10 MG tablet Take 1 tablet (10 mg  total) by mouth daily. 03/31/16  Yes Julianne Rice, MD  meloxicam (MOBIC) 7.5 MG tablet TAKE 1 TABLET BY MOUTH EVERY DAY 04/29/16  Yes Trula Slade, DPM  metFORMIN (GLUCOPHAGE) 500 MG tablet Take 1 tablet (500 mg total) by mouth 2 (two) times daily with a meal. 03/26/16  Yes Vann, Jessica U, DO  miconazole (MICOTIN) 2 % cream Apply topically 2 (two) times daily. 03/26/16  Yes Geradine Girt, DO  ofloxacin (OCUFLOX) 0.3 % ophthalmic solution  04/08/16  Yes [provider]  prednisoLONE acetate (PRED FORTE) 1 % ophthalmic suspension  04/08/16  Yes [provider]  topiramate (TOPAMAX) 50 MG tablet Take 1 tablet (50 mg total) by mouth 2 (two) times daily. 03/10/13  Yes Garvin Fila, MD  triamcinolone ointment (KENALOG) 0.1 % Apply topically 3 (three) times daily. 03/26/16  Yes Vann, Jessica U, DO  zolpidem (AMBIEN) 5 MG tablet Take 5 mg by mouth at bedtime as needed for sleep.   Yes [provider]    Family History Family History  Problem Relation Age of Onset  . Diabetes Mother   . Stroke Father   . Cancer Paternal Aunt   . Cancer Paternal Aunt   . Diabetes Brother     Social History Social History   Tobacco Use  . Smoking status: Former Smoker    Last attempt to quit: 12/13/2015    Years since quitting: 1.3  . Smokeless tobacco: Never Used  Substance Use Topics  . Alcohol use: No  . Drug use: No    Types: Marijuana    Comment: occ - states stopped smokin weed 04/2014     Allergies   Cefazolin   Review of Systems Review of Systems  Constitutional: Negative for chills and fever.  HENT: Negative for ear pain and sore throat.   Eyes: Negative for pain and visual disturbance.  Respiratory: Negative for cough and shortness of breath.   Cardiovascular: Negative for chest pain and palpitations.  Gastrointestinal: Negative for abdominal pain and vomiting.  Genitourinary: Negative for dysuria and hematuria.  Musculoskeletal: Negative for  arthralgias and back pain.       Pain to right elbow moves down to wrist and up to shoulder.   Skin: Negative for color change and rash.  Neurological: Negative for seizures and syncope.  All other systems reviewed and are negative.    Physical Exam Triage Vital Signs ED Triage Vitals  Enc Vitals Group     BP 04/12/17 1413 (!) 128/91     Pulse Rate 04/12/17 1413 75     Resp 04/12/17 1413 20     Temp 04/12/17 1413 97.7 F (36.5 C)     Temp Source 04/12/17 1413 Oral     SpO2 04/12/17  1413 95 %     Weight --      Height --      Head Circumference --      Peak Flow --      Pain Score 04/12/17 1412 6     Pain Loc --      Pain Edu? --      Excl. in [place]? --    No data found.  Updated Vital Signs BP (!) 128/91 (BP Location: Right Arm)   Pulse 75   Temp 97.7 F (36.5 C) (Oral)   Resp 20   SpO2 95%   Visual Acuity Right Eye Distance:   Left Eye Distance:   Bilateral Distance:    Right Eye Near:   Left Eye Near:    Bilateral Near:     Physical Exam  Constitutional: She is oriented to person, place, and time. She appears well-developed and well-nourished.  HENT:  Head: Normocephalic and atraumatic.  Eyes: Conjunctivae are normal.  Neck: Normal range of motion.  Pulmonary/Chest: Effort normal.  Musculoskeletal: Normal range of motion.       Right shoulder: She exhibits bony tenderness. She exhibits no swelling, no effusion, no deformity and no laceration.       Arms: Neurological: She is alert and oriented to person, place, and time.  Skin: Skin is warm.  Psychiatric: She has a normal mood and affect.  Nursing note and vitals reviewed.    UC Treatments / Results  Labs (all labs ordered are listed, but only abnormal results are displayed) Labs Reviewed - No data to display  EKG None Radiology No results found.  Procedures Procedures (including critical care time)  Medications Ordered in UC Medications - No data to display   Initial Impression /  Assessment and Plan / UC Course  I have reviewed the triage vital signs and the nursing notes.  Pertinent labs & imaging results that were available during my care of the patient were reviewed by me and considered in my medical decision making (see chart for details).       Final Clinical Impressions(s) / UC Diagnoses   Final diagnoses:  None    ED Discharge Orders    None       Controlled Substance Prescriptions [place] Controlled Substance Registry consulted? Not Applicable   Jacqualine Mau, NP 04/12/17 1427

## 2017-04-12 NOTE — Discharge Instructions (Addendum)
Continue to use ice and alternate with heat

## 2017-04-26 ENCOUNTER — Other Ambulatory Visit: Payer: Self-pay | Admitting: Family Medicine

## 2017-04-26 DIAGNOSIS — Z1159 Encounter for screening for other viral diseases: Secondary | ICD-10-CM

## 2017-04-26 DIAGNOSIS — Z794 Long term (current) use of insulin: Secondary | ICD-10-CM

## 2017-04-26 DIAGNOSIS — E118 Type 2 diabetes mellitus with unspecified complications: Secondary | ICD-10-CM

## 2017-04-26 DIAGNOSIS — I10 Essential (primary) hypertension: Secondary | ICD-10-CM

## 2017-04-28 ENCOUNTER — Other Ambulatory Visit: Payer: Medicare PPO

## 2017-04-29 ENCOUNTER — Ambulatory Visit: Payer: Medicare PPO

## 2017-04-30 ENCOUNTER — Encounter: Payer: Medicare PPO | Admitting: Family Medicine

## 2017-04-30 ENCOUNTER — Encounter: Payer: Self-pay | Admitting: Family Medicine

## 2017-04-30 ENCOUNTER — Other Ambulatory Visit: Payer: Self-pay | Admitting: Family Medicine

## 2017-04-30 ENCOUNTER — Ambulatory Visit (INDEPENDENT_AMBULATORY_CARE_PROVIDER_SITE_OTHER): Payer: Medicare PPO | Admitting: Family Medicine

## 2017-04-30 VITALS — BP 110/72 | HR 73 | Temp 98.2°F | Wt 214.2 lb

## 2017-04-30 DIAGNOSIS — Z794 Long term (current) use of insulin: Secondary | ICD-10-CM

## 2017-04-30 DIAGNOSIS — R6882 Decreased libido: Secondary | ICD-10-CM | POA: Diagnosis not present

## 2017-04-30 DIAGNOSIS — Z1159 Encounter for screening for other viral diseases: Secondary | ICD-10-CM

## 2017-04-30 DIAGNOSIS — M25522 Pain in left elbow: Secondary | ICD-10-CM

## 2017-04-30 DIAGNOSIS — E118 Type 2 diabetes mellitus with unspecified complications: Secondary | ICD-10-CM

## 2017-04-30 DIAGNOSIS — I10 Essential (primary) hypertension: Secondary | ICD-10-CM

## 2017-04-30 LAB — CBC WITH DIFFERENTIAL/PLATELET
Basophils Absolute: 0 10*3/uL (ref 0.0–0.1)
Basophils Relative: 0.3 % (ref 0.0–3.0)
EOS ABS: 0.5 10*3/uL (ref 0.0–0.7)
Eosinophils Relative: 4.2 % (ref 0.0–5.0)
HCT: 40.4 % (ref 36.0–46.0)
HEMOGLOBIN: 13.3 g/dL (ref 12.0–15.0)
Lymphocytes Relative: 34.7 % (ref 12.0–46.0)
Lymphs Abs: 3.7 10*3/uL (ref 0.7–4.0)
MCHC: 32.8 g/dL (ref 30.0–36.0)
MCV: 85.3 fl (ref 78.0–100.0)
MONO ABS: 0.5 10*3/uL (ref 0.1–1.0)
Monocytes Relative: 4.9 % (ref 3.0–12.0)
NEUTROS PCT: 55.9 % (ref 43.0–77.0)
Neutro Abs: 6 10*3/uL (ref 1.4–7.7)
Platelets: 231 10*3/uL (ref 150.0–400.0)
RBC: 4.74 Mil/uL (ref 3.87–5.11)
RDW: 15.5 % (ref 11.5–15.5)
WBC: 10.8 10*3/uL — AB (ref 4.0–10.5)

## 2017-04-30 LAB — COMPREHENSIVE METABOLIC PANEL
ALBUMIN: 4.1 g/dL (ref 3.5–5.2)
ALK PHOS: 60 U/L (ref 39–117)
ALT: 12 U/L (ref 0–35)
AST: 11 U/L (ref 0–37)
BUN: 10 mg/dL (ref 6–23)
CO2: 28 mEq/L (ref 19–32)
CREATININE: 0.6 mg/dL (ref 0.40–1.20)
Calcium: 9.4 mg/dL (ref 8.4–10.5)
Chloride: 105 mEq/L (ref 96–112)
GFR: 133.66 mL/min (ref >60.00)
Glucose, Bld: 156 mg/dL — ABNORMAL HIGH (ref 70–99)
Potassium: 4.6 mEq/L (ref 3.5–5.1)
SODIUM: 141 meq/L (ref 135–145)
TOTAL PROTEIN: 7.2 g/dL (ref 6.0–8.3)
Total Bilirubin: 0.3 mg/dL (ref 0.2–1.2)

## 2017-04-30 LAB — MICROALBUMIN / CREATININE URINE RATIO
Creatinine,U: 151.6 mg/dL
Microalb Creat Ratio: 0.6 mg/g (ref 0.0–30.0)
Microalb, Ur: 0.9 mg/dL (ref 0.0–1.9)

## 2017-04-30 LAB — HEMOGLOBIN A1C: Hgb A1c MFr Bld: 8.4 % — ABNORMAL HIGH (ref 4.6–6.5)

## 2017-04-30 LAB — TSH: TSH: 0.43 u[IU]/mL (ref 0.35–4.50)

## 2017-04-30 MED ORDER — NAPROXEN 500 MG PO TABS: 500.0000 mg | ORAL_TABLET | Freq: Two times a day (BID) | ORAL | 0 refills | Status: DC | PRN

## 2017-04-30 MED ORDER — TOPIRAMATE 50 MG PO TABS: 50.0000 mg | ORAL_TABLET | Freq: Two times a day (BID) | ORAL | 3 refills | Status: DC

## 2017-04-30 NOTE — Patient Instructions (Addendum)
For your allergies, try an over the counter long acting antihistamine like loratadine or cetirizine  Take daily.   Stop insulin.    Tennis Elbow Tennis elbow is puffiness (inflammation) of the outer tendons of your forearm close to your elbow. Your tendons attach your muscles to your bones. Tennis elbow can happen in any sport or job in which you use your elbow too much. It is caused by doing the same motion over and over. Tennis elbow can cause:  Pain and tenderness in your forearm and the outer part of your elbow.  A burning feeling. This runs from your elbow through your arm.  Weak grip in your hands.  Follow these instructions at home: Activity  Rest your elbow and wrist as told by your doctor. Try to avoid any activities that caused the problem until your doctor says that you can do them again.  If a physical therapist teaches you exercises, do all of them as told.  If you lift an object, lift it with your palm facing up. This is easier on your elbow. Lifestyle  If your tennis elbow is caused by sports, check your equipment and make sure that: ? You are using it correctly. ? It fits you well.  If your tennis elbow is caused by work, take breaks often, if you are able. Talk with your manager about doing your work in a way that is safe for you. ? If your tennis elbow is caused by computer use, talk with your manager about any changes that can be made to your work setup. General instructions  If told, apply ice to the painful area: ? Put ice in a plastic bag. ? Place a towel between your skin and the bag. ? Leave the ice on for 20 minutes, 2-3 times per day.  Take medicines only as told by your doctor.  If you were given a brace, wear it as told by your doctor.  Keep all follow-up visits as told by your doctor. This is important. Contact a doctor if:  Your pain does not get better with treatment.  Your pain gets worse.  You have weakness in your forearm, hand, or  fingers.  You cannot feel your forearm, hand, or fingers. This information is not intended to replace advice given to you by your health care provider. Make sure you discuss any questions you have with your health care provider. Document Released: 06/20/2009 Document Revised: 08/31/2015 Document Reviewed: 12/27/2013 Elsevier Interactive Patient Education  [employer].

## 2017-04-30 NOTE — Progress Notes (Signed)
Subjective:    Patient ID: Autumn Valenzuela, female    DOB: April 01, 1960, 57 y.o.   MRN: [id number]  HPI This is a 57 yo female who presents today for follow up of left elbow pain. Was seen at [facility] 04/12/17. She noticed pain after working 8 hour shift (longer than she typically works, is a Clinical research associate at [employer]). Prescribed naproxen. Has been getting better.   DM- down to 2 units of insulin daily, last blood sugar last night 120s. She continues to exercise most days and is watching diet.   Headaches- well controlled on Topamax, requests refill today.   Decreased libido- has noticed over last several years. Not sure if related to medication. Not currently in a sexual relationship but has a "friend" and is interested in a sexual relationship. Currently she masturbates. Has some desire, but not as much as in the past.   Past Medical History:  Diagnosis Date  . Bipolar disorder ([place])   . CVA (cerebral infarction)   . Depression   . Diabetes mellitus without complication ([place])   . High cholesterol   . Hypertension   . Migraine    Past Surgical History:  Procedure Laterality Date  . BACK SURGERY    . PARTIAL HYSTERECTOMY     Family History  Problem Relation Age of Onset  . Diabetes Mother   . Stroke Father   . Cancer Paternal Aunt   . Cancer Paternal Aunt   . Diabetes Brother    Social History   Tobacco Use  . Smoking status: Former Smoker    Last attempt to quit: 12/13/2015    Years since quitting: 1.3  . Smokeless tobacco: Never Used  Substance Use Topics  . Alcohol use: No  . Drug use: No    Types: Marijuana    Comment: occ - states stopped smokin weed 04/2014      Review of Systems Per HPI    Objective:   Physical Exam  Constitutional: She appears well-developed and well-nourished. No distress.  HENT:  Head: Normocephalic and atraumatic.  Eyes: Conjunctivae are normal.  Cardiovascular: Normal rate.  Pulmonary/Chest: Effort normal.  Musculoskeletal:   Left elbow: She exhibits normal range of motion, no swelling and no effusion. Tenderness found. Medial epicondyle and lateral epicondyle tenderness noted.  Skin: She is not diaphoretic.  Vitals reviewed.     BP 110/72 (BP Location: Left Arm, Patient Position: Sitting, Cuff Size: Normal)   Pulse 73   Temp 98.2 F (36.8 C) (Oral)   Wt 214 lb 4 oz (97.2 kg)   SpO2 98%   BMI 40.48 kg/m  Wt Readings from Last 3 Encounters:  04/30/17 214 lb 4 oz (97.2 kg)  10/30/16 215 lb (97.5 kg)  09/06/16 212 lb 3.2 oz (96.3 kg)       Assessment & Plan:  1. 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  Dispense: 30 tablet; Refill: 0  2. Encounter for hepatitis C screening test for low risk patient - Hepatitis C antibody  3. Type 2 diabetes mellitus with complication, with long-term current use of insulin (HCC) - Microalbumin / creatinine urine ratio - Hemoglobin A1c - Comprehensive metabolic panel - CBC with Differential  4. Essential hypertension - Microalbumin / creatinine urine ratio - Comprehensive metabolic panel -  CBC with Differential  5. Obesity, Class III, BMI 40-49.9 (morbid obesity) (HCC) - TSH  6. Decreased libido - likely multifactorial, certainly could be related to her psych meds, discussed this but importance of taking meds - encouraged increasing exercise, managing stress - encouraged safe sex if she becomes sexually active  - follow up in 3 months  Clarene Reamer, FNP-BC  [facility] at [facility], [place]  05/01/2017 9:08 AM

## 2017-05-01 LAB — HEPATITIS C ANTIBODY
Hepatitis C Ab: NONREACTIVE
SIGNAL TO CUT-OFF: 0.01 (ref <1.00)

## 2017-05-01 MED ORDER — GLIPIZIDE ER 5 MG PO TB24: 5.0000 mg | ORAL_TABLET | Freq: Every day | ORAL | 1 refills | Status: DC

## 2017-05-01 NOTE — Addendum Note (Signed)
Addended by: Clarene Reamer B on: 05/01/2017 02:19 PM   Modules accepted: Orders

## 2017-05-05 ENCOUNTER — Encounter: Payer: Medicare PPO | Admitting: Family Medicine

## 2017-05-13 ENCOUNTER — Other Ambulatory Visit: Payer: Self-pay | Admitting: Family Medicine

## 2017-05-17 ENCOUNTER — Other Ambulatory Visit: Payer: Self-pay | Admitting: Family Medicine

## 2017-05-17 DIAGNOSIS — M25522 Pain in left elbow: Secondary | ICD-10-CM

## 2017-05-19 ENCOUNTER — Other Ambulatory Visit: Payer: Self-pay | Admitting: Family Medicine

## 2017-05-19 ENCOUNTER — Other Ambulatory Visit: Payer: Self-pay | Admitting: Emergency Medicine

## 2017-05-19 DIAGNOSIS — M25522 Pain in left elbow: Secondary | ICD-10-CM

## 2017-05-19 DIAGNOSIS — F419 Anxiety disorder, unspecified: Secondary | ICD-10-CM

## 2017-05-19 MED ORDER — ALPRAZOLAM 0.5 MG PO TABS: 0.5000 mg | ORAL_TABLET | Freq: Every day | ORAL | 2 refills | Status: DC | PRN

## 2017-05-19 MED ORDER — NAPROXEN 500 MG PO TABS: ORAL_TABLET | ORAL | 0 refills | Status: DC

## 2017-05-19 NOTE — Telephone Encounter (Signed)
Requesting refill xanax to [facility] [address]; Last refilled # 30 x 2 on 06/26/16. Pt last seen for acute visit on 04/30/17 Xanax last addressed at 06/26/16 visit. Pt has CPX scheduled 07/07/17.

## 2017-05-19 NOTE — Telephone Encounter (Signed)
Copied from [place] [id number]. Topic: Quick Communication - Rx Refill/Question >> May 19, 2017 11:50 AM Ether Griffins B wrote: Medication: ALPRAZolam Duanne Moron) 0.5 MG tablet Has the patient contacted their pharmacy? Yes.   (Agent: If no, request that the patient contact the pharmacy for the refill.) Preferred Pharmacy (with phone number or street name): [id number] - [place], [address]: Please be advised that RX refills may take up to 3 business days. We ask that you follow-up with your pharmacy.

## 2017-07-07 ENCOUNTER — Encounter: Payer: Medicare PPO | Admitting: Family Medicine

## 2017-07-07 ENCOUNTER — Ambulatory Visit: Payer: Medicare PPO

## 2017-08-18 ENCOUNTER — Other Ambulatory Visit: Payer: Self-pay | Admitting: Family Medicine

## 2017-08-18 ENCOUNTER — Ambulatory Visit (INDEPENDENT_AMBULATORY_CARE_PROVIDER_SITE_OTHER): Payer: Medicare PPO

## 2017-08-18 VITALS — BP 102/78 | HR 76 | Temp 97.7°F | Ht 61.0 in | Wt 218.5 lb

## 2017-08-18 DIAGNOSIS — E118 Type 2 diabetes mellitus with unspecified complications: Secondary | ICD-10-CM

## 2017-08-18 DIAGNOSIS — M79671 Pain in right foot: Secondary | ICD-10-CM

## 2017-08-18 DIAGNOSIS — M79672 Pain in left foot: Secondary | ICD-10-CM | POA: Diagnosis not present

## 2017-08-18 DIAGNOSIS — Z794 Long term (current) use of insulin: Secondary | ICD-10-CM

## 2017-08-18 DIAGNOSIS — Z0000 Encounter for general adult medical examination without abnormal findings: Secondary | ICD-10-CM

## 2017-08-18 LAB — HEMOGLOBIN A1C: Hgb A1c MFr Bld: 7.9 % — ABNORMAL HIGH (ref 4.6–6.5)

## 2017-08-18 LAB — VITAMIN B12: VITAMIN B 12: 1044 pg/mL — AB (ref 211–911)

## 2017-08-18 LAB — VITAMIN D 25 HYDROXY (VIT D DEFICIENCY, FRACTURES): VITD: 8.86 ng/mL — AB (ref 30.00–100.00)

## 2017-08-18 NOTE — Progress Notes (Signed)
I reviewed health advisor's note, was available for consultation, and agree with documentation and plan.  

## 2017-08-18 NOTE — Progress Notes (Signed)
PCP notes:   Health maintenance:  Foot exam - PCP follow-up needed PNA vaccine - PCP follow-up needed Mammogram - PCP follow-up needed Flu vaccine - addressed Tetanus vaccine - postponed/insurance A1C - completed  Abnormal screenings:   Mini-Cog score: 19/20 MMSE - Mini Mental State Exam 08/18/2017  Orientation to time 5  Orientation to Place 5  Registration 3  Attention/ Calculation 0  Recall 2  Recall-comments unable to recall 1 of 3 words  Language- name 2 objects 0  Language- repeat 1  Language- follow 3 step command 3  Language- read & follow direction 0  Write a sentence 0  Copy design 0  Total score 19    Patient concerns:   A. Increasing pain in upper back and left arm.   B. Hot flashes - increasingly worse; OTC medications are not effective; wants to discuss referral to GYN  C. Headaches - increasingly worse; wants to know if she should resume BP medication.  Nurse concerns:  None  Next PCP appt:   08/25/17 @ 930

## 2017-08-18 NOTE — Progress Notes (Signed)
Subjective:   Autumn Valenzuela is a 57 y.o. female who presents for an Initial Medicare Annual Wellness Visit.  Review of Systems    N/A  Cardiac Risk Factors include: advanced age (>26mn, >>39women);diabetes mellitus;dyslipidemia;obesity (BMI >30kg/m2);hypertension     Objective:    Today's Vitals   08/18/17 1258 08/18/17 1503  BP: 102/78   Pulse: 76   Temp: 97.7 F (36.5 C)   TempSrc: Oral   SpO2: 96%   Weight: 218 lb 8 oz (99.1 kg)   Height: _0  (1.549 m)   PainSc: 8  8   PainLoc: Back    Body mass index is 41.29 kg/m.  Advanced Directives 08/18/2017 04/08/2016 03/31/2016 03/24/2016 01/13/2016 07/24/2015 04/24/2015  Does Patient Have a Medical Advance Directive? _1  No No  Would patient like information on creating a medical advance directive? Yes (MAU/Ambulatory/Procedural Areas - Information given) - - No - Patient declined No - Patient declined No - patient declined information -    Current Medications (verified) Outpatient Encounter Medications as of 08/18/2017  Medication Sig  . ACCU-CHEK AVIVA PLUS test strip TEST FOUR TIMES DAILY AS DIRECTED AS NEEDED  . ACCU-CHEK SOFTCLIX LANCETS lancets USE UP TO QID UTD  . albuterol (PROVENTIL HFA;VENTOLIN HFA) 108 (90 Base) MCG/ACT inhaler Inhale 2 puffs into the lungs daily as needed for wheezing or shortness of breath.  .Marland Kitchenalbuterol (PROVENTIL) (2.5 MG/3ML) 0.083% nebulizer solution Take 2.5 mg by nebulization at bedtime as needed for wheezing.  .Marland KitchenALPRAZolam (XANAX) 0.5 MG tablet Take 1 tablet (0.5 mg total) by mouth daily as needed for anxiety.  . ARIPiprazole (ABILIFY) 5 MG tablet Take 5 mg by mouth daily.   . blood glucose meter kit and supplies Dispense based on patient and insurance preference. Use up to four times daily as directed. (FOR ICD-9 250.00, 250.01).  . calcium-vitamin D (OSCAL WITH D) 500-200 MG-UNIT per tablet Take 1 tablet by mouth daily with breakfast.  . DULoxetine (CYMBALTA) 60 MG capsule  Take 60 mg by mouth at bedtime.   . fluticasone (FLONASE) 50 MCG/ACT nasal spray Place 2 sprays into both nostrils daily. (Patient taking differently: Place 2 sprays into both nostrils as needed. )  . glipiZIDE (GLUCOTROL XL) 5 MG 24 hr tablet Take 1 tablet (5 mg total) by mouth daily with breakfast.  . Insulin Pen Needle 31G X 5 MM MISC For use with solostar and novolog  . ketorolac (ACULAR) 0.4 % SOLN   . loratadine (CLARITIN) 10 MG tablet Take 1 tablet (10 mg total) by mouth daily.  . metFORMIN (GLUCOPHAGE) 500 MG tablet Take 1 tablet (500 mg total) by mouth 2 (two) times daily with a meal.  . miconazole (MICOTIN) 2 % cream Apply topically 2 (two) times daily.  . naproxen (NAPROSYN) 500 MG tablet TAKE 1 TABLET(500 MG) BY MOUTH TWICE DAILY AS NEEDED  . ofloxacin (OCUFLOX) 0.3 % ophthalmic solution as needed.   . prednisoLONE acetate (PRED FORTE) 1 % ophthalmic suspension   . topiramate (TOPAMAX) 50 MG tablet Take 1 tablet (50 mg total) by mouth 2 (two) times daily.  .Marland Kitchentriamcinolone ointment (KENALOG) 0.1 % Apply topically 3 (three) times daily. (Patient taking differently: Apply topically as needed. )  . zolpidem (AMBIEN) 5 MG tablet Take 5 mg by mouth at bedtime as needed for sleep.  . [DISCONTINUED] doxycycline (VIBRAMYCIN) 100 MG capsule Take 1 capsule (100 mg total) by mouth 2 (two) times daily.  . insulin aspart (  NOVOLOG) 100 UNIT/ML FlexPen TID with meals: CBG 70 - 120: 0 units CBG 121 - 150: 2 units CBG 151 - 200: 3 units CBG 201 - 250: 5 units CBG 251 - 300: 8 units CBG 301 - 350: 11 units CBG 351 - 400: 15 units (Patient not taking: Reported on 08/18/2017)  . Insulin Glargine (LANTUS SOLOSTAR) 100 UNIT/ML Solostar Pen Inject 30 Units into the skin daily at 10 pm. (Patient not taking: Reported on 08/18/2017)  . irbesartan (AVAPRO) 150 MG tablet Take 1 tablet (150 mg total) by mouth daily. (Patient not taking: Reported on 08/18/2017)   Facility-Administered Encounter Medications as of  08/18/2017  Medication  . triamcinolone acetonide (KENALOG) 10 MG/ML injection 10 mg  . triamcinolone acetonide (KENALOG) 10 MG/ML injection 10 mg    Allergies (verified) Cefazolin   History: Past Medical History:  Diagnosis Date  . Bipolar disorder ([place])   . CVA (cerebral infarction)   . Depression   . Diabetes mellitus without complication ([place])   . High cholesterol   . Hypertension   . Migraine    Past Surgical History:  Procedure Laterality Date  . BACK SURGERY    . PARTIAL HYSTERECTOMY     Family History  Problem Relation Age of Onset  . Diabetes Mother   . Stroke Father   . Cancer Paternal Aunt   . Cancer Paternal Aunt   . Diabetes Brother    Social History   Socioeconomic History  . Marital status: Single    Spouse name: Not on file  . Number of children: 3  . Years of education: 53  . Highest education level: Not on file  Occupational History  . Not on file  Social Needs  . Financial resource strain: Not on file  . Food insecurity:    Worry: Not on file    Inability: Not on file  . Transportation needs:    Medical: Not on file    Non-medical: Not on file  Tobacco Use  . Smoking status: Former Smoker    Last attempt to quit: 12/13/2015    Years since quitting: 1.6  . Smokeless tobacco: Never Used  Substance and Sexual Activity  . Alcohol use: No  . Drug use: No    Types: Marijuana    Comment: occ - states stopped smokin weed 04/2014  . Sexual activity: Yes    Birth control/protection: None  Lifestyle  . Physical activity:    Days per week: Not on file    Minutes per session: Not on file  . Stress: Not on file  Relationships  . Social connections:    Talks on phone: Not on file    Gets together: Not on file    Attends religious service: Not on file    Active member of club or organization: Not on file    Attends meetings of clubs or organizations: Not on file    Relationship status: Not on file  Other Topics Concern  . Not on file    Social History Narrative   Patient is single with 3 children.   Patient is right handed.   Patient has a high school education.   Patient drinks 3 cups daily.    Tobacco Counseling Counseling given: No   Clinical Intake:  Pre-visit preparation completed: Yes  Pain Score: 8 (upper back, left arm)     Nutritional Status: BMI > 30  Obese Nutritional Risks: None Diabetes: Yes CBG done?: No Did pt. bring in CBG monitor  from home?: No  How often do you need to have someone help you when you read instructions, pamphlets, or other written materials from your doctor or pharmacy?: 1 - Never What is the last grade level you completed in school?: 12th grade  Interpreter Needed?: No  Comments: pt lives alone Information entered by :: LPinson, LPN   Activities of Daily Living In your present state of health, do you have any difficulty performing the following activities: 08/18/2017  Hearing? N  Vision? N  Difficulty concentrating or making decisions? Y  Walking or climbing stairs? N  Dressing or bathing? N  Doing errands, shopping? N  Preparing Food and eating ? N  Using the Toilet? N  In the past six months, have you accidently leaked urine? N  Do you have problems with loss of bowel control? N  Managing your Medications? N  Managing your Finances? N  Housekeeping or managing your Housekeeping? N  Some recent data might be hidden     Immunizations and Health Maintenance  There is no immunization history on file for this patient. There are no preventive care reminders to display for this patient.  Patient Care Team: Elby Beck, FNP as PCP - General (Nurse Practitioner)     Assessment:   This is a routine wellness examination for Autumn Valenzuela.  Hearing/Vision screen  Hearing Screening   _0  _1  _2  _3  _4  _5  _6  _7  _8   Right ear:   40 40 40  40    Left ear:   40 40 40  40    Vision Screening Comments: Vision exam in July 2019 with  Dr. Katy Fitch  Dietary issues and exercise activities discussed: Current Exercise Habits: Home exercise routine, Type of exercise: walking, Time (Minutes): 45, Frequency (Times/Week): 7, Weekly Exercise (Minutes/Week): 315, Intensity: Mild, Exercise limited by: None identified  Goals    . Patient Stated     Starting 08/18/2017, I will continue to take medications as prescribed.      Depression Screen PHQ 2/9 Scores 08/18/2017 04/22/2016 03/27/2016  PHQ - 2 Score 0 0 2  PHQ- 9 Score 0 - 21    Fall Risk Fall Risk  08/18/2017 04/22/2016  Falls in the past year? No No    Cognitive Function: MMSE - Mini Mental State Exam 08/18/2017  Orientation to time 5  Orientation to Place 5  Registration 3  Attention/ Calculation 0  Recall 2  Recall-comments unable to recall 1 of 3 words  Language- name 2 objects 0  Language- repeat 1  Language- follow 3 step command 3  Language- read & follow direction 0  Write a sentence 0  Copy design 0  Total score 19     PLEASE NOTE: A Mini-Cog screen was completed. Maximum score is 20. A value of 0 denotes this part of [place] MMSE was not completed or the patient failed this part of the Mini-Cog screening.   Mini-Cog Screening Orientation to Time - Max 5 pts Orientation to Place - Max 5 pts Registration - Max 3 pts Recall - Max 3 pts Language Repeat - Max 1 pts Language Follow 3 Step Command - Max 3 pts     Screening Tests Health Maintenance  Topic Date Due  . FOOT EXAM  08/25/2017 (Originally [birth year]/1972)  . MAMMOGRAM  01/13/2018 (Originally 11/20/2015)  . PNEUMOCOCCAL POLYSACCHARIDE VACCINE (1) 04/14/2018 (Originally 09/06/1964)  . INFLUENZA VACCINE  04/14/2018 (Originally 08/14/2017)  . TETANUS/TDAP  08/19/2018 (Originally 09/06/1981)  . HEMOGLOBIN A1C  10/30/2017  .  PAP SMEAR  01/14/2018  . OPHTHALMOLOGY EXAM  06/15/2018  . COLONOSCOPY  04/21/2022  . Hepatitis C Screening  Completed  . HIV Screening  Completed      Plan:     I have personally  reviewed, addressed, and noted the following in the patient's chart:  A. Medical and social history B. Use of alcohol, tobacco or illicit drugs  C. Current medications and supplements D. Functional ability and status E.  Nutritional status F.  Physical activity G. Advance directives H. List of other physicians I.  Hospitalizations, surgeries, and ER visits in previous 12 months J.  [place] to include hearing, vision, cognitive, depression L. Referrals and appointments - none  In addition, I have reviewed and discussed with patient certain preventive protocols, quality metrics, and best practice recommendations. A written personalized care plan for preventive services as well as general preventive health recommendations were provided to patient.  See attached scanned questionnaire for additional information.   Signed,   Lindell Noe, MHA, BS, LPN Health Coach

## 2017-08-18 NOTE — Patient Instructions (Signed)
Ms. Stankovic , Thank you for taking time to come for your Medicare Wellness Visit. I appreciate your ongoing commitment to your health goals. Please review the following plan we discussed and let me know if I can assist you in the future.   These are the goals we discussed: Goals    . Patient Stated     Starting 08/18/2017, I will continue to take medications as prescribed.       This is a list of the screening recommended for you and due dates:  Health Maintenance  Topic Date Due  . Complete foot exam   08/25/2017*  . Mammogram  01/13/2018*  . Pneumococcal vaccine (1) 04/14/2018*  . Flu Shot  04/14/2018*  . Tetanus Vaccine  08/19/2018*  . Hemoglobin A1C  10/30/2017  . Pap Smear  01/14/2018  . Eye exam for diabetics  06/15/2018  . Colon Cancer Screening  04/21/2022  .  Hepatitis C: One time screening is recommended by Center for Disease Control  (CDC) for  adults born from [birth year] through 1963.   Completed  . HIV Screening  Completed  *Topic was postponed. The date shown is not the original due date.   Preventive Care for Adults  A healthy lifestyle and preventive care can promote health and wellness. Preventive health guidelines for adults include the following key practices.  . A routine yearly physical is a good way to check with your health care provider about your health and preventive screening. It is a chance to share any concerns and updates on your health and to receive a thorough exam.  . Visit your dentist for a routine exam and preventive care every 6 months. Brush your teeth twice a day and floss once a day. Good oral hygiene prevents tooth decay and gum disease.  . The frequency of eye exams is based on your age, health, family medical history, use  of contact lenses, and other factors. Follow your health care provider's recommendations for frequency of eye exams.  . Eat a healthy diet. Foods like vegetables, fruits, whole grains, low-fat dairy products, and lean protein  foods contain the nutrients you need without too many calories. Decrease your intake of foods high in solid fats, added sugars, and salt. Eat the right amount of calories for you. Get information about a proper diet from your health care provider, if necessary.  . Regular physical exercise is one of the most important things you can do for your health. Most adults should get at least 150 minutes of moderate-intensity exercise (any activity that increases your heart rate and causes you to sweat) each week. In addition, most adults need muscle-strengthening exercises on 2 or more days a week.  Silver Sneakers may be a benefit available to you. To determine eligibility, you may visit the website: www.silversneakers.com or contact program at 763-235-8835 Mon-Fri between 8AM-8PM.   . Maintain a healthy weight. The body mass index (BMI) is a screening tool to identify possible weight problems. It provides an estimate of body fat based on height and weight. Your health care provider can find your BMI and can help you achieve or maintain a healthy weight.   For adults 20 years and older: ? A BMI below 18.5 is considered underweight. ? A BMI of 18.5 to 24.9 is normal. ? A BMI of 25 to 29.9 is considered overweight. ? A BMI of 30 and above is considered obese.   . Maintain normal blood lipids and cholesterol levels by exercising and minimizing  your intake of saturated fat. Eat a balanced diet with plenty of fruit and vegetables. Blood tests for lipids and cholesterol should begin at age 42 and be repeated every 5 years. If your lipid or cholesterol levels are high, you are over 50, or you are at high risk for heart disease, you may need your cholesterol levels checked more frequently. Ongoing high lipid and cholesterol levels should be treated with medicines if diet and exercise are not working.  . If you smoke, find out from your health care provider how to quit. If you do not use tobacco, please do not  start.  . If you choose to drink alcohol, please do not consume more than 2 drinks per day. One drink is considered to be 12 ounces (355 mL) of beer, 5 ounces (148 mL) of wine, or 1.5 ounces (44 mL) of liquor.  . If you are 44-20 years old, ask your health care provider if you should take aspirin to prevent strokes.  . Use sunscreen. Apply sunscreen liberally and repeatedly throughout the day. You should seek shade when your shadow is shorter than you. Protect yourself by wearing long sleeves, pants, a wide-brimmed hat, and sunglasses year round, whenever you are outdoors.  . Once a month, do a whole body skin exam, using a mirror to look at the skin on your back. Tell your health care provider of new moles, moles that have irregular borders, moles that are larger than a pencil eraser, or moles that have changed in shape or color.

## 2017-08-25 ENCOUNTER — Ambulatory Visit (INDEPENDENT_AMBULATORY_CARE_PROVIDER_SITE_OTHER): Payer: Medicare PPO | Admitting: Family Medicine

## 2017-08-25 ENCOUNTER — Encounter: Payer: Self-pay | Admitting: Family Medicine

## 2017-08-25 VITALS — BP 110/80 | HR 78 | Temp 98.6°F | Ht 61.0 in | Wt 219.0 lb

## 2017-08-25 DIAGNOSIS — E118 Type 2 diabetes mellitus with unspecified complications: Secondary | ICD-10-CM

## 2017-08-25 DIAGNOSIS — Z23 Encounter for immunization: Secondary | ICD-10-CM

## 2017-08-25 DIAGNOSIS — M25522 Pain in left elbow: Secondary | ICD-10-CM

## 2017-08-25 DIAGNOSIS — Z0000 Encounter for general adult medical examination without abnormal findings: Secondary | ICD-10-CM | POA: Diagnosis not present

## 2017-08-25 DIAGNOSIS — E559 Vitamin D deficiency, unspecified: Secondary | ICD-10-CM | POA: Diagnosis not present

## 2017-08-25 MED ORDER — VITAMIN D (ERGOCALCIFEROL) 1.25 MG (50000 UNIT) PO CAPS: 50000.0000 [IU] | ORAL_CAPSULE | ORAL | 3 refills | Status: DC

## 2017-08-25 MED ORDER — NAPROXEN 500 MG PO TABS: ORAL_TABLET | ORAL | 1 refills | Status: DC

## 2017-08-25 NOTE — Patient Instructions (Addendum)
Book I like- [place] of things you can do to bring peace and restoration  Get back to your exercise- will help with mood, sleep  Please call and schedule an appointment for screening mammogram. A referral is not needed.  [place]  Follow up in 3 months Keeping You Healthy  Get These Tests  Blood Pressure- Have your blood pressure checked by your healthcare provider at least once a year.  Normal blood pressure is 120/80.  Weight- Have your body mass index (BMI) calculated to screen for obesity.  BMI is a measure of body fat based on height and weight.  You can calculate your own BMI at [employer]velB[employer].it  Cholesterol- Have your cholesterol checked every year.  Diabetes- Have your blood sugar checked every year if you have high blood pressure, high cholesterol, a family history of diabetes or if you are overweight.  Pap Test - Have a pap test every 1 to 5 years if you have been sexually active.  If you are older than 65 and recent pap tests have been normal you may not need additional pap tests.  In addition, if you have had a hysterectomy  for benign disease additional pap tests are not necessary.  Mammogram-Yearly mammograms are essential for early detection of breast cancer  Screening for Colon Cancer- Colonoscopy starting at age 45. Screening may begin sooner depending on your family history and other health conditions.  Follow up colonoscopy as directed by your Gastroenterologist.  Screening for Osteoporosis- Screening begins at age 74 with bone density scanning, sooner if you are at higher risk for developing Osteoporosis.  Get these medicines  Calcium with Vitamin D- Your body requires 1200-1500 mg of Calcium a day and [phone] IU of Vitamin D a day.  You can only absorb 500 mg of Calcium at a time therefore Calcium must be taken in 2 or 3 separate doses throughout the day.  Hormones- Hormone therapy has  been associated with increased risk for certain cancers and heart disease.  Talk to your healthcare provider about if you need relief from menopausal symptoms.  Aspirin- Ask your healthcare provider about taking Aspirin to prevent Heart Disease and Stroke.  Get these Immuniztions  Flu shot- Every fall  Pneumonia shot- Once after the age of 35; if you are younger ask your healthcare provider if you need a pneumonia shot.  Tetanus- Every ten years.  Zostavax- Once after the age of 70 to prevent shingles.  Take these steps  Don't smoke- Your healthcare provider can help you quit. For tips on how to quit, ask your healthcare provider or go to www.smokefree.gov or call 1-800 QUIT-NOW.  Be physically active- Exercise 5 days a week for a minimum of 30 minutes.  If you are not already physically active, start slow and gradually work up to 30 minutes of moderate physical activity.  Try walking, dancing, bike riding, swimming, etc.  Eat a healthy diet- Eat a variety of healthy foods such as fruits, vegetables, whole grains, low fat milk, low fat cheeses, yogurt, lean meats, chicken, fish, eggs, dried beans, tofu, etc.  For more information go to www.thenutritionsource.org  Dental visit- Brush and floss teeth twice daily; visit your dentist twice a year.  Eye exam- Visit your Optometrist or Ophthalmologist yearly.  Drink alcohol in moderation- Limit alcohol intake to one drink or less a day.  Never drink and drive.  Depression- Your emotional health is as important as your physical health.  If you're feeling down or losing interest in things you normally enjoy, please talk to your healthcare provider.  Seat Belts- can save your life; always wear one  Smoke/Carbon Monoxide detectors- These detectors need to be installed on the appropriate level of your home.  Replace batteries at least once a year.  Violence- If anyone is threatening or hurting you, please tell your healthcare  provider.  Living Will/ Health care power of attorney- Discuss with your healthcare provider and family.  Menopause Menopause is the normal time of life when menstrual periods stop completely. Menopause is complete when you have missed 12 consecutive menstrual periods. It usually occurs between the ages of 23 years and 53 years. Very rarely does a woman develop menopause before the age of 52 years. At menopause, your ovaries stop producing the female hormones estrogen and progesterone. This can cause undesirable symptoms and also affect your health. Sometimes the symptoms may occur 4-5 years before the menopause begins. There is no relationship between menopause and:  Oral contraceptives.  Number of children you had.  Race.  The age your menstrual periods started (menarche).  Heavy smokers and very thin women may develop menopause earlier in life. What are the causes?  The ovaries stop producing the female hormones estrogen and progesterone. Other causes include:  Surgery to remove both ovaries.  The ovaries stop functioning for no known reason.  Tumors of the pituitary gland in the brain.  Medical disease that affects the ovaries and hormone production.  Radiation treatment to the abdomen or pelvis.  Chemotherapy that affects the ovaries.  What are the signs or symptoms?  Hot flashes.  Night sweats.  Decrease in sex drive.  Vaginal dryness and thinning of the vagina causing painful intercourse.  Dryness of the skin and developing wrinkles.  Headaches.  Tiredness.  Irritability.  Memory problems.  Weight gain.  Bladder infections.  Hair growth of the face and chest.  Infertility. More serious symptoms include:  Loss of bone (osteoporosis) causing breaks (fractures).  Depression.  Hardening and narrowing of the arteries (atherosclerosis) causing heart attacks and strokes.  How is this diagnosed?  When the menstrual periods have stopped for 12  straight months.  Physical exam.  Hormone studies of the blood. How is this treated? There are many treatment choices and nearly as many questions about them. The decisions to treat or not to treat menopausal changes is an individual choice made with your health care provider. Your health care provider can discuss the treatments with you. Together, you can decide which treatment will work best for you. Your treatment choices may include:  Hormone therapy (estrogen and progesterone).  Non-hormonal medicines.  Treating the individual symptoms with medicine (for example antidepressants for depression).  Herbal medicines that may help specific symptoms.  Counseling by a psychiatrist or psychologist.  Group therapy.  Lifestyle changes including: ? Eating healthy. ? Regular exercise. ? Limiting caffeine and alcohol. ? Stress management and meditation.  No treatment.  Follow these instructions at home:  Take the medicine your health care provider gives you as directed.  Get plenty of sleep and rest.  Exercise regularly.  Eat a diet that contains calcium (good for the bones) and soy products (acts like estrogen hormone).  Avoid alcoholic beverages.  Do not smoke.  If you have hot flashes, dress in layers.  Take supplements, calcium, and vitamin D to strengthen bones.  You can use over-the-counter lubricants or moisturizers for vaginal dryness.  Group therapy is sometimes very  helpful.  Acupuncture may be helpful in some cases. Contact a health care provider if:  You are not sure you are in menopause.  You are having menopausal symptoms and need advice and treatment.  You are still having menstrual periods after age 29 years.  You have pain with intercourse.  Menopause is complete (no menstrual period for 12 months) and you develop vaginal bleeding.  You need a referral to a specialist (gynecologist, psychiatrist, or psychologist) for treatment. Get help right  away if:  You have severe depression.  You have excessive vaginal bleeding.  You fell and think you have a broken bone.  You have pain when you urinate.  You develop leg or chest pain.  You have a fast pounding heart beat (palpitations).  You have severe headaches.  You develop vision problems.  You feel a lump in your breast.  You have abdominal pain or severe indigestion. This information is not intended to replace advice given to you by your health care provider. Make sure you discuss any questions you have with your health care provider. Document Released: 03/23/2003 Document Revised: 06/08/2015 Document Reviewed: 07/30/2012 Elsevier Interactive Patient Education  2017 [employer].

## 2017-08-25 NOTE — Progress Notes (Signed)
Subjective:    Patient ID: Autumn Valenzuela, female    DOB: [birth year], 57 y.o.   MRN: [id number]  HPI This is a 57 yo female who presents today for CPE.   Menopausal symptoms- history of hysterectomy, continues to have hot flashes frequently. Taking hot baths/showers.   Mood- not taking time for her self, helps her daughter with her grandchildren, works, help at [employer]. Less tolerant at work, takes alprazolam occasionally with good results. Tired, not able to sleep, brain won't stop.   DM2- off insulin, taking metformin 500 mg bid and glipizide 5 mg qd. Checks blood sugars occasionally, never over 200.   Last CPE- unknown Mammo- 11/29/13 Pap- hysterectomy Colonoscopy-  04/20/2012- nml Tdap- unknown Flu-annual Eye- regular Exercise- has not been exercising as much   Past Medical History:  Diagnosis Date  . Bipolar disorder ([place])   . CVA (cerebral infarction)   . Depression   . Diabetes mellitus without complication ([place])   . High cholesterol   . Hypertension   . Migraine    Past Surgical History:  Procedure Laterality Date  . BACK SURGERY    . PARTIAL HYSTERECTOMY     Family History  Problem Relation Age of Onset  . Diabetes Mother   . Stroke Father   . Cancer Paternal Aunt   . Cancer Paternal Aunt   . Diabetes Brother    Social History   Tobacco Use  . Smoking status: Former Smoker    Last attempt to quit: 12/13/2015    Years since quitting: 1.7  . Smokeless tobacco: Never Used  Substance Use Topics  . Alcohol use: No  . Drug use: No    Types: Marijuana    Comment: occ - states stopped smokin weed 04/2014     Review of Systems  Constitutional: Positive for fatigue.  HENT: Negative.   Eyes: Negative.   Respiratory: Negative.   Cardiovascular: Negative.   Gastrointestinal: Positive for diarrhea (occasional).  Endocrine: Positive for heat intolerance (hot flashes frequently).  Genitourinary: Negative.   Musculoskeletal:       Left elbow pain,  improved with naprosyn 3x/ week, bracing.        Objective:   Physical Exam Physical Exam  Constitutional: She is oriented to person, place, and time. She appears well-developed and well-nourished. No distress.  HENT:  Head: Normocephalic and atraumatic.  Right Ear: External ear normal.  Left Ear: External ear normal.  Nose: Nose normal.  Mouth/Throat: Oropharynx is clear and moist. No oropharyngeal exudate.  Eyes: Conjunctivae are normal.   Neck: Normal range of motion. Neck supple. No JVD present. No thyromegaly present.  Cardiovascular: Normal rate, regular rhythm, normal heart sounds and intact distal pulses.   Pulmonary/Chest: Effort normal and breath sounds normal. Right breast exhibits no inverted nipple, no mass, no nipple discharge, no skin change and no tenderness. Left breast exhibits no inverted nipple, no mass, no nipple discharge, no skin change and no tenderness. Breasts are symmetrical.  Abdominal: Soft. Bowel sounds are normal. She exhibits no distension and no mass. There is no tenderness. There is no rebound and no guarding.  Genitourinary: Vagina normal. Pelvic exam was performed with patient supine. There is no rash, tenderness, lesion or injury on the right labia. There is no rash, tenderness, lesion or injury on the left labia. Cervix absent. No vaginal discharge found.  Musculoskeletal: No edema.  Lymphadenopathy:    She has no cervical adenopathy.  Neurological: She is alert and oriented to person,  place, and time. She has normal reflexes.  Skin: Skin is warm and dry. She is not diaphoretic.  Psychiatric: She has a normal mood and affect. Her behavior is normal. Judgment and thought content normal. Occasionally tearful.  Vitals reviewed.     BP 110/80 (BP Location: Right Arm, Patient Position: Sitting, Cuff Size: Large)   Pulse 78   Temp 98.6 F (37 C) (Oral)   Ht 5\' 1"  (1.549 m)   Wt 219 lb (99.3 kg)   SpO2 97%   BMI 41.38 kg/m  Wt Readings from Last  3 Encounters:  08/25/17 219 lb (99.3 kg)  08/18/17 218 lb 8 oz (99.1 kg)  04/30/17 214 lb 4 oz (97.2 kg)   Diabetic foot exam: Normal inspection No skin breakdown No calluses  Normal DP pulses Normal sensation to light touch  Nails normal Depression screen [facility] 2/9 08/25/2017 08/18/2017 04/22/2016 03/27/2016  Decreased Interest 0 0 0 1  Down, Depressed, Hopeless 1 0 0 1  PHQ - 2 Score 1 0 0 2  Altered sleeping 2 0 - 3  Tired, decreased energy 3 0 - 3  Change in appetite 1 0 - 3  Feeling bad or failure about yourself  3 0 - 3  Trouble concentrating 1 0 - 3  Moving slowly or fidgety/restless 1 0 - 3  Suicidal thoughts 1 0 - 1  PHQ-9 Score 13 0 - 21  Difficult doing work/chores - Not difficult at all - Somewhat difficult       Assessment & Plan:  1. Annual physical exam - Discussed and encouraged healthy lifestyle choices- adequate sleep, regular exercise, stress management and healthy food choices.  - feeling overburdened helping others - reviewed labs  2. Vitamin D deficiency - Vitamin D, Ergocalciferol, (DRISDOL) 50000 units CAPS capsule; Take 1 capsule (50,000 Units total) by mouth every 7 (seven) days.  Dispense: 12 capsule; Refill: 3  3. Left elbow pain - naproxen (NAPROSYN) 500 MG tablet; TAKE 1 TABLET(500 MG) BY MOUTH TWICE DAILY AS NEEDED  Dispense: 90 tablet; Refill: 1  4. Need for vaccination against Streptococcus pneumoniae - Pneumococcal polysaccharide vaccine 23-valent greater than or equal to 2yo subcutaneous/IM  5. Type 2 diabetes mellitus with complication, without long-term current use of insulin (HCC) - HgbA1c improved from 8.4 - 7.9. Have been able to get her off her insulin. Continue current oral meds, healthy food choices and encouraged her to increase her exercise.   - follow up in 3 months   Clarene Reamer, FNP-BC  [facility]  08/25/2017 12:14 PM

## 2017-10-13 ENCOUNTER — Ambulatory Visit (INDEPENDENT_AMBULATORY_CARE_PROVIDER_SITE_OTHER): Payer: Medicare PPO | Admitting: Family Medicine

## 2017-10-13 ENCOUNTER — Encounter (INDEPENDENT_AMBULATORY_CARE_PROVIDER_SITE_OTHER): Payer: Self-pay

## 2017-10-13 ENCOUNTER — Encounter: Payer: Self-pay | Admitting: Family Medicine

## 2017-10-13 ENCOUNTER — Ambulatory Visit (INDEPENDENT_AMBULATORY_CARE_PROVIDER_SITE_OTHER)
Admission: RE | Admit: 2017-10-13 | Discharge: 2017-10-13 | Disposition: A | Discharge status: Home / Self Care | Payer: Medicare PPO | Source: Ambulatory Visit | Attending: Family Medicine | Admitting: Family Medicine

## 2017-10-13 VITALS — BP 132/78 | HR 71 | Temp 98.5°F | Ht 61.0 in | Wt 215.0 lb

## 2017-10-13 DIAGNOSIS — G43709 Chronic migraine without aura, not intractable, without status migrainosus: Secondary | ICD-10-CM

## 2017-10-13 DIAGNOSIS — F3170 Bipolar disorder, currently in remission, most recent episode unspecified: Secondary | ICD-10-CM | POA: Diagnosis not present

## 2017-10-13 DIAGNOSIS — R232 Flushing: Secondary | ICD-10-CM | POA: Diagnosis not present

## 2017-10-13 DIAGNOSIS — M25552 Pain in left hip: Secondary | ICD-10-CM | POA: Diagnosis not present

## 2017-10-13 DIAGNOSIS — M25562 Pain in left knee: Secondary | ICD-10-CM

## 2017-10-13 DIAGNOSIS — G8929 Other chronic pain: Secondary | ICD-10-CM

## 2017-10-13 DIAGNOSIS — R936 Abnormal findings on diagnostic imaging of limbs: Secondary | ICD-10-CM

## 2017-10-13 DIAGNOSIS — Z96652 Presence of left artificial knee joint: Secondary | ICD-10-CM

## 2017-10-13 MED ORDER — TOPIRAMATE 100 MG PO TABS: 100.0000 mg | ORAL_TABLET | Freq: Two times a day (BID) | ORAL | 2 refills | Status: DC

## 2017-10-13 MED ORDER — GABAPENTIN 100 MG PO CAPS: ORAL_CAPSULE | ORAL | 5 refills | Status: DC

## 2017-10-13 MED ORDER — DULOXETINE HCL 60 MG PO CPEP: 60.0000 mg | ORAL_CAPSULE | Freq: Every day | ORAL | 1 refills | Status: DC

## 2017-10-13 NOTE — Patient Instructions (Addendum)
Good to see you today  Please take 2 of the 50 mg of your topiramate until you run out, I have sent in a new prescription of 100 mg to your pharmacy- you will take one of these twice a day  I have sent in a prescription for gabapentin to your pharmacy to help with your hot flashes and leg/hip pain  Please follow up in 4-6 weeks

## 2017-10-13 NOTE — Progress Notes (Signed)
Subjective:    Patient ID: Autumn Valenzuela, female    DOB: May 06, 1960, 57 y.o.   MRN: [id number]  HPI This is a 57 yo female, accompanied by her daughter, who presents today with several issues.  Pain- left elbow pain, left hip pain radiating to left knee. Worsening pain, has had to decrease activity. Has to sit down more frequently. Left knee replacement, feels like it has "slipped," some swelling. Little improvement with naprsyn, feels more unstable. Wearing more supportive shoes.   Migraine- headaches across eyes, to top of head. No improvement with topamax. Unable to pinpoint triggers. Can't get relief. Does not take any other meds for headaches. Good fluid intake, eats regularly. Sound and light sensitivity. Not sleeping well due to pain and hot flashes.   Hot flashes- more frequent, 3-4 daily, bad at night. Has tried some over the counter meds without relief.   Bi-polar- the facility where she got her meds closed. She is on duloxetine but has been off aripiprazole for several months.   Past Medical History:  Diagnosis Date  . Bipolar disorder ([place])   . CVA (cerebral infarction)   . Depression   . Diabetes mellitus without complication ([place])   . High cholesterol   . Hypertension   . Migraine    Past Surgical History:  Procedure Laterality Date  . BACK SURGERY    . PARTIAL HYSTERECTOMY     Family History  Problem Relation Age of Onset  . Diabetes Mother   . Stroke Father   . Cancer Paternal Aunt   . Cancer Paternal Aunt   . Diabetes Brother    Social History   Tobacco Use  . Smoking status: Former Smoker    Last attempt to quit: 12/13/2015    Years since quitting: 1.8  . Smokeless tobacco: Never Used  Substance Use Topics  . Alcohol use: No  . Drug use: No    Types: Marijuana    Comment: occ - states stopped smokin weed 04/2014     Review of Systems Per HPI    Objective:   Physical Exam  Constitutional: She is oriented to person, place, and time. She  appears well-developed and well-nourished. No distress.  HENT:  Head: Normocephalic and atraumatic.  Eyes: Conjunctivae are normal.  Cardiovascular: Normal rate, regular rhythm and normal heart sounds.  Pulmonary/Chest: Effort normal and breath sounds normal.  Musculoskeletal:       Left hip: She exhibits decreased range of motion and tenderness (glute). She exhibits normal strength.       Left knee: She exhibits decreased range of motion (decreased extension. ). She exhibits no swelling, normal patellar mobility, no bony tenderness, normal meniscus and no MCL laxity. Tenderness found. Medial joint line tenderness noted.  Neurological: She is alert and oriented to person, place, and time.  Skin: Skin is warm and dry. She is not diaphoretic.  Psychiatric: She has a normal mood and affect. Her behavior is normal. Judgment and thought content normal.  Tearful at times.   Vitals reviewed.     BP 132/78 (BP Location: Right Arm, Patient Position: Sitting, Cuff Size: Large)   Pulse 71   Temp 98.5 F (36.9 C) (Oral)   Ht 5\' 1"  (1.549 m)   Wt 215 lb (97.5 kg)   SpO2 97%   BMI 40.62 kg/m  Wt Readings from Last 3 Encounters:  10/13/17 215 lb (97.5 kg)  08/25/17 219 lb (99.3 kg)  08/18/17 218 lb 8 oz (99.1 kg)  BP Readings from Last 3 Encounters:  10/13/17 132/78  08/25/17 110/80  08/18/17 102/78   Dg Knee Complete 4 Views Left  Result Date: 10/13/2017 CLINICAL DATA:  LEFT knee pain.  History of TKR many years ago. EXAM: LEFT KNEE - COMPLETE 4+ VIEW COMPARISON:  01/14/2007 FINDINGS: LEFT total knee arthroplasty. There is normal alignment. No acute fracture or joint effusion. Along the anterior aspect of the tibial component, there is thin linear lucency which raises question of early loosening or infection. Incidental imaging of the RIGHT knee is unremarkable. IMPRESSION: 1. No evidence for acute fracture. 2. Question of early loosening/infection of the tibial component. Electronically  Signed   By: Nolon Nations M.D.   On: 10/13/2017 14:24       Assessment & Plan:  1. Hot flashes - disrupting sleep, not a candidate for HRT due to prior CVA - gabapentin (NEURONTIN) 100 MG capsule; Take 1 capsule at bedtime x 1 week then increase to 1 capsule twice a day  Dispense: 60 capsule; Refill: 5  2. Pain of left hip joint - prior xray showed arthritic changes, suspect worsening pain with left knee pain and gait disturbance - gabapentin (NEURONTIN) 100 MG capsule; Take 1 capsule at bedtime x 1 week then increase to 1 capsule twice a day  Dispense: 60 capsule; Refill: 5  3. Chronic pain of left knee - Xray showed posible loosening of TKR, will send her to ortho - gabapentin (NEURONTIN) 100 MG capsule; Take 1 capsule at bedtime x 1 week then increase to 1 capsule twice a day  Dispense: 60 capsule; Refill: 5 - DG Knee Complete 4 Views Left; Future - Ambulatory referral to Orthopedic Surgery  4. Bipolar disorder in full remission, most recent episode unspecified type ([place]) - Some worsening mood compared to recent visits, needs psych follow up  - Ambulatory referral to Psychiatry - DULoxetine (CYMBALTA) 60 MG capsule; Take 1 capsule (60 mg total) by mouth at bedtime.  Dispense: 90 capsule; Refill: 1  5. Chronic migraine without aura without status migrainosus, not intractable - will increase topiramate and see if she can get improved sleep with low dose gabapentin, encouraged increased fluids, regular exercise - topiramate (TOPAMAX) 100 MG tablet; Take 1 tablet (100 mg total) by mouth 2 (two) times daily.  Dispense: 60 tablet; Refill: 2  6. History of total left knee replacement (TKR) - Ambulatory referral to Orthopedic Surgery  7. Abnormal x-ray of lower extremity - Ambulatory referral to Orthopedic Surgery   Clarene Reamer, FNP-BC  [facility], [place]  10/13/2017 5:27 PM

## 2017-10-14 ENCOUNTER — Telehealth: Payer: Self-pay

## 2017-10-14 ENCOUNTER — Other Ambulatory Visit: Payer: Self-pay

## 2017-10-14 ENCOUNTER — Emergency Department (HOSPITAL_COMMUNITY)
Admission: EM | Admit: 2017-10-14 | Discharge: 2017-10-14 | Disposition: A | Discharge status: Home / Self Care | Payer: Medicare PPO | Attending: Emergency Medicine | Admitting: Emergency Medicine

## 2017-10-14 ENCOUNTER — Encounter (HOSPITAL_COMMUNITY): Payer: Self-pay

## 2017-10-14 DIAGNOSIS — E119 Type 2 diabetes mellitus without complications: Secondary | ICD-10-CM | POA: Diagnosis not present

## 2017-10-14 DIAGNOSIS — M79605 Pain in left leg: Secondary | ICD-10-CM | POA: Insufficient documentation

## 2017-10-14 DIAGNOSIS — Z7984 Long term (current) use of oral hypoglycemic drugs: Secondary | ICD-10-CM | POA: Diagnosis not present

## 2017-10-14 DIAGNOSIS — Z79899 Other long term (current) drug therapy: Secondary | ICD-10-CM | POA: Insufficient documentation

## 2017-10-14 DIAGNOSIS — Z87891 Personal history of nicotine dependence: Secondary | ICD-10-CM | POA: Diagnosis not present

## 2017-10-14 DIAGNOSIS — I10 Essential (primary) hypertension: Secondary | ICD-10-CM | POA: Insufficient documentation

## 2017-10-14 DIAGNOSIS — M79604 Pain in right leg: Secondary | ICD-10-CM

## 2017-10-14 MED ORDER — KETOROLAC TROMETHAMINE 60 MG/2ML IM SOLN
30.0000 mg | Freq: Once | INTRAMUSCULAR | Status: AC
  Administered 2017-10-14: 30 mg via INTRAMUSCULAR
  Filled 2017-10-14: qty 2

## 2017-10-14 MED ORDER — METHOCARBAMOL 500 MG PO TABS: 500.0000 mg | ORAL_TABLET | Freq: Two times a day (BID) | ORAL | 0 refills | Status: DC | PRN

## 2017-10-14 NOTE — ED Provider Notes (Signed)
[facility] EMERGENCY DEPARTMENT Provider Note   CSN: [id number] Arrival date & time: 10/14/17  0045    History   Chief Complaint Chief Complaint  Patient presents with  . Leg Pain    leg    HPI Autumn Valenzuela is a 57 y.o. female.   57 year old female with history of CVA, hypertension, dyslipidemia, diabetes, bipolar presents to the emergency department for evaluation of left lower extremity pain.  States that pain worsened after taking gabapentin.  She was seen at her primary office yesterday for complaints of pain to her left knee.  She was instructed to start taking gabapentin before bed tonight.  Patient complains of pain from her lateral left thigh distally.  Pain is intermittent and waxing and waning in severity.  She denies any known modifying factors of her symptoms.  No repeat fall or injury.  Has not taken any medications for her worsening pain tonight.     Past Medical History:  Diagnosis Date  . Bipolar disorder ([place])   . CVA (cerebral infarction)   . Depression   . Diabetes mellitus without complication ([place])   . High cholesterol   . Hypertension   . Migraine     Patient Active Problem List   Diagnosis Date Noted  . Bipolar disorder ([place]) 03/29/2016  . Diabetic ketoacidosis ([place]) 03/24/2016  . Dermatitis 03/24/2016  . HTN (hypertension) 03/24/2016  . HLD (hyperlipidemia) 03/24/2016  . DM (diabetes mellitus) ([place]) 03/24/2016  . Vaginal yeast infection 03/24/2016  . Hyperglycemia   . Chronic daily headache 11/23/2012  . 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 Depression 11/23/2012    Past Surgical History:  Procedure Laterality Date  . BACK SURGERY    . PARTIAL HYSTERECTOMY       OB History   None      Home Medications    Prior to Admission medications   Medication Sig Start Date End Date Taking? Authorizing Provider  ACCU-CHEK AVIVA PLUS test strip TEST FOUR TIMES DAILY AS DIRECTED AS NEEDED  05/13/17   Elby Beck, FNP  ACCU-CHEK SOFTCLIX LANCETS lancets USE UP TO QID UTD 10/30/16   Elby Beck, FNP  albuterol (PROVENTIL HFA;VENTOLIN HFA) 108 (90 Base) MCG/ACT inhaler Inhale 2 puffs into the lungs daily as needed for wheezing or shortness of breath. 10/30/16   Elby Beck, FNP  albuterol (PROVENTIL) (2.5 MG/3ML) 0.083% nebulizer solution Take 2.5 mg by nebulization at bedtime as needed for wheezing.    [provider]  ALPRAZolam Duanne Moron) 0.5 MG tablet Take 1 tablet (0.5 mg total) by mouth daily as needed for anxiety. 05/19/17   Elby Beck, FNP  blood glucose meter kit and supplies Dispense based on patient and insurance preference. Use up to four times daily as directed. (FOR ICD-9 250.00, 250.01). 03/26/16   Geradine Girt, DO  calcium-vitamin D (OSCAL WITH D) 500-200 MG-UNIT per tablet Take 1 tablet by mouth daily with breakfast.    [provider]  DULoxetine (CYMBALTA) 60 MG capsule Take 1 capsule (60 mg total) by mouth at bedtime. 10/13/17   Elby Beck, FNP  fluticasone (FLONASE) 50 MCG/ACT nasal spray Place 2 sprays into both nostrils daily. Patient taking differently: Place 2 sprays into both nostrils as needed.  03/31/16   Julianne Rice, MD  gabapentin (NEURONTIN) 100 MG capsule Take 1 capsule at bedtime x 1 week then increase to 1 capsule twice a day 10/13/17   Elby Beck, FNP  glipiZIDE (GLUCOTROL XL) 5 MG 24 hr tablet Take 1 tablet (5 mg total) by mouth daily with breakfast. 05/01/17   Elby Beck, FNP  irbesartan (AVAPRO) 150 MG tablet Take 1 tablet (150 mg total) by mouth daily. 03/27/16   Geradine Girt, DO  ketorolac (ACULAR) 0.4 % SOLN  04/08/16   [provider]  loratadine (CLARITIN) 10 MG tablet Take 1 tablet (10 mg total) by mouth daily. 03/31/16   Julianne Rice, MD  metFORMIN (GLUCOPHAGE) 500 MG tablet Take 1 tablet (500 mg total) by mouth 2 (two) times daily with a meal. 03/26/16   Geradine Girt, DO  methocarbamol (ROBAXIN) 500 MG tablet Take 1 tablet (500 mg total) by mouth every 12 (twelve) hours as needed for muscle spasms. 10/14/17   Antonietta Breach, PA-C  naproxen (NAPROSYN) 500 MG tablet TAKE 1 TABLET(500 MG) BY MOUTH TWICE DAILY AS NEEDED 08/25/17   Elby Beck, FNP  topiramate (TOPAMAX) 100 MG tablet Take 1 tablet (100 mg total) by mouth 2 (two) times daily. 10/13/17   Elby Beck, FNP  triamcinolone ointment (KENALOG) 0.1 % Apply topically 3 (three) times daily. Patient taking differently: Apply topically as needed.  03/26/16   Geradine Girt, DO  Vitamin D, Ergocalciferol, (DRISDOL) 50000 units CAPS capsule Take 1 capsule (50,000 Units total) by mouth every 7 (seven) days. 08/25/17   Elby Beck, FNP  zolpidem (AMBIEN) 5 MG tablet Take 5 mg by mouth at bedtime as needed for sleep.    [provider]    Family History Family History  Problem Relation Age of Onset  . Diabetes Mother   . Stroke Father   . Cancer Paternal Aunt   . Cancer Paternal Aunt   . Diabetes Brother     Social History Social History   Tobacco Use  . Smoking status: Former Smoker    Last attempt to quit: 12/13/2015    Years since quitting: 1.8  . Smokeless tobacco: Never Used  Substance Use Topics  . Alcohol use: No  . Drug use: No    Types: Marijuana    Comment: occ - states stopped smokin weed 04/2014     Allergies   Cefazolin   Review of Systems Review of Systems Ten systems reviewed and are negative for acute change, except as noted in the HPI.    Physical Exam Updated Vital Signs BP 120/80   Pulse 63   Temp 98.3 F (36.8 C) (Oral)   Resp 19   Ht _0  (1.549 m)   Wt 97.5 kg   SpO2 100%   BMI 40.62 kg/m   Physical Exam  Constitutional: She is oriented to person, place, and time. She appears well-developed and well-nourished. No distress.  Nontoxic appearing and in NAD  HENT:  Head: Normocephalic and atraumatic.  Eyes: Conjunctivae  and EOM are normal. No scleral icterus.  Neck: Normal range of motion.  Cardiovascular: Normal rate, regular rhythm and intact distal pulses.  DP pulse 2+ in the LLE  Pulmonary/Chest: Effort normal. No stridor. No respiratory distress.  Respirations even and unlabored  Musculoskeletal: Normal range of motion.  Normal ROM of the LLE. No significant BLE edema. No erythema, heat to touch.  Neurological: She is alert and oriented to person, place, and time. She exhibits normal muscle tone. Coordination normal.  Sensation to light touch intact. Ambulatory with steady gait.  Skin: Skin is warm and dry. No rash noted. She is not diaphoretic. No erythema.  No pallor.  Psychiatric: She has a normal mood and affect. Her behavior is normal.  Nursing note and vitals reviewed.    ED Treatments / Results  Labs (all labs ordered are listed, but only abnormal results are displayed) Labs Reviewed - No data to display  EKG None  Radiology Dg Knee Complete 4 Views Left  Result Date: 10/13/2017 CLINICAL DATA:  LEFT knee pain.  History of TKR many years ago. EXAM: LEFT KNEE - COMPLETE 4+ VIEW COMPARISON:  01/14/2007 FINDINGS: LEFT total knee arthroplasty. There is normal alignment. No acute fracture or joint effusion. Along the anterior aspect of the tibial component, there is thin linear lucency which raises question of early loosening or infection. Incidental imaging of the RIGHT knee is unremarkable. IMPRESSION: 1. No evidence for acute fracture. 2. Question of early loosening/infection of the tibial component. Electronically Signed   By: Nolon Nations M.D.   On: 10/13/2017 14:24    Procedures Procedures (including critical care time)  Medications Ordered in ED Medications  ketorolac (TORADOL) injection 30 mg (30 mg Intramuscular Given 10/14/17 0431)     Initial Impression / Assessment and Plan / ED Course  I have reviewed the triage vital signs and the nursing notes.  Pertinent labs &  imaging results that were available during my care of the patient were reviewed by me and considered in my medical decision making (see chart for details).     Patient presents to the emergency department for evaluation of LLE pain. Patient neurovascularly intact on exam. No swelling, erythema, heat to touch to the affected area; no concern for septic joint. Compartments in the affected extremity are soft. Question radicular symptoms vs muscle spasm. Plan for supportive management including RICE and NSAIDs; prescribed Robaxin for PRN use. Will continue with outpatient orthopedic follow up. Return precautions discussed and provided. Patient discharged in stable condition with no unaddressed concerns.   Final Clinical Impressions(s) / ED Diagnoses   Final diagnoses:  Right leg pain    ED Discharge Orders         Ordered    methocarbamol (ROBAXIN) 500 MG tablet  Every 12 hours PRN     10/14/17 0459           Antonietta Breach, PA-C 10/14/17 0503    Horton, Barbette Hair, MD 10/14/17 [phone]

## 2017-10-14 NOTE — ED Notes (Addendum)
Patient here for  Left leg pain, rated pain 10/10.

## 2017-10-14 NOTE — Telephone Encounter (Signed)
Called and left Vm for pt to return call to office. Upon her return call please get information on her concern for NP Clarene Reamer is not ine office or available to return her call.

## 2017-10-14 NOTE — ED Triage Notes (Signed)
Pt here for worsening left leg pain.  Had xrays done by PCP and perscribed gabapentin for the pain.  Patient states pain is worse and cramps the posterior of the leg.  A&Ox4, ambulatory to triage.

## 2017-10-14 NOTE — Telephone Encounter (Signed)
PLEASE NOTE: All timestamps contained within this report are represented as [place] Standard Time. CONFIDENTIALTY NOTICE: This fax transmission is intended only for the addressee. It contains information that is legally privileged, confidential or otherwise protected from use or disclosure. If you are not the intended recipient, you are strictly prohibited from reviewing, disclosing, copying using or disseminating any of this information or taking any action in reliance on or regarding this information. If you have received this fax in error, please notify us immediately by telephone so that we can arrange for its return to [place]. Phone: [phone], Toll-Free: [phone], Fax: [phone] Page: 1 of 1 Call Id: [id number] [place] Night - Client Nonclinical Telephone Record [place] Night - Client Client Site [facility] Physician Tor Netters- NP Contact Type Call Who Is Calling Patient / Member / Family / Caregiver Caller Name Tylisha Danis Phone Number [phone] Call Type Message Only Information Provided Reason for Call Returning a Call from the Office Initial [place] states she wants the doctor to call her back Additional Comment Call Closed By: Myrlene Broker Transaction Date/Time: 10/14/2017 9:42:49 AM (ET)

## 2017-10-14 NOTE — Discharge Instructions (Signed)
To new gabapentin until you discuss your side effects with your doctor. Take Naproxen for any persistent symptoms. You can try Robaxin for muscle spasms. Continue follow up with Orthopedics as scheduled.

## 2017-10-15 ENCOUNTER — Ambulatory Visit (INDEPENDENT_AMBULATORY_CARE_PROVIDER_SITE_OTHER): Payer: Medicare PPO | Admitting: Family Medicine

## 2017-10-15 ENCOUNTER — Encounter: Payer: Self-pay | Admitting: Family Medicine

## 2017-10-15 VITALS — BP 112/72 | HR 90 | Temp 98.0°F | Ht 61.0 in | Wt 212.0 lb

## 2017-10-15 DIAGNOSIS — G8929 Other chronic pain: Secondary | ICD-10-CM

## 2017-10-15 DIAGNOSIS — M25562 Pain in left knee: Secondary | ICD-10-CM

## 2017-10-15 DIAGNOSIS — R110 Nausea: Secondary | ICD-10-CM

## 2017-10-15 MED ORDER — NAPROXEN 500 MG PO TABS: ORAL_TABLET | ORAL | 1 refills | Status: DC

## 2017-10-15 MED ORDER — ONDANSETRON 8 MG PO TBDP: 8.0000 mg | ORAL_TABLET | Freq: Three times a day (TID) | ORAL | 0 refills | Status: DC | PRN

## 2017-10-15 NOTE — Patient Instructions (Signed)
I have sent anti-nausea and anti-inflammatory medication to your pharmacy  Please try gabapentin at bedtime

## 2017-10-15 NOTE — Progress Notes (Signed)
Subjective:    Patient ID: Autumn Valenzuela, female    DOB: [birth year], 57 y.o.   MRN: [id number]  HPI This is a 57 yo female who presents today for follow up of pain and ER visit. Accompanied by her daughter. Was seen by me on Monday for left knee and hip pain as well as hot flashes. Xray of knee showed possible early loosening of TKR and she was referred to orthopedics. Later that evening, after taking first dose of gabapentin, she began having different left leg pain. Worse with lying down on left lateral side. Was unable to get comfortable. Went to ER. No worrisome findings, diagnosed with muscle spasm and given prescription for methocarbamol 500 mg q 12 hours prn. Today she reports pain significantly improved. She had one more additional episode of pain, some relief with methocarbamol. Has not taken any additional gabapentin since first dose. Currently no pain beyond her recent left knee and left hip pain.   Some nausea with pain. Had some loose stool today. No fever or chills.   Past Medical History:  Diagnosis Date  . Bipolar disorder ([place])   . CVA (cerebral infarction)   . Depression   . Diabetes mellitus without complication ([place])   . High cholesterol   . Hypertension   . Migraine    Past Surgical History:  Procedure Laterality Date  . BACK SURGERY    . PARTIAL HYSTERECTOMY     Family History  Problem Relation Age of Onset  . Diabetes Mother   . Stroke Father   . Cancer Paternal Aunt   . Cancer Paternal Aunt   . Diabetes Brother    Social History   Tobacco Use  . Smoking status: Former Smoker    Last attempt to quit: 12/13/2015    Years since quitting: 1.8  . Smokeless tobacco: Never Used  Substance Use Topics  . Alcohol use: No  . Drug use: No    Types: Marijuana    Comment: occ - states stopped smokin weed 04/2014      Review of Systems Per HPI    Objective:   Physical Exam  Constitutional: She is oriented to person, place, and time. She appears  well-developed and well-nourished. No distress.  HENT:  Head: Normocephalic and atraumatic.  Eyes: Conjunctivae are normal.  Cardiovascular: Normal rate.  Pulmonary/Chest: Effort normal.  Neurological: She is alert and oriented to person, place, and time.  Skin: Skin is warm and dry. She is not diaphoretic.  Psychiatric: She has a normal mood and affect. Her behavior is normal. Judgment and thought content normal.  Vitals reviewed.        BP 112/72 (BP Location: Right Arm, Patient Position: Sitting, Cuff Size: Large)   Pulse 90   Temp 98 F (36.7 C) (Oral)   Ht 5\' 1"  (1.549 m)   Wt 212 lb (96.2 kg)   SpO2 97%   BMI 40.06 kg/m  Wt Readings from Last 3 Encounters:  10/15/17 212 lb (96.2 kg)  10/14/17 215 lb (97.5 kg)  10/13/17 215 lb (97.5 kg)    Assessment & Plan:  1. Chronic pain of left knee - discussed her restarting gabapentin for her pain, hot flashes - she has ortho appointment for 10/20/17 - naproxen (NAPROSYN) 500 MG tablet; TAKE 1 TABLET(500 MG) BY MOUTH TWICE DAILY AS NEEDED  Dispense: 60 tablet; Refill: 1  2. Nausea - RTC precautions reviewed - ondansetron (ZOFRAN-ODT) 8 MG disintegrating tablet; Take 1 tablet (8 mg total)  by mouth every 8 (eight) hours as needed for nausea.  Dispense: 20 tablet; Refill: 0  - follow up on file for 3 weeks  Clarene Reamer, FNP-BC  [facility] at [facility], [place]  10/19/2017 2:17 PM

## 2017-10-15 NOTE — Telephone Encounter (Signed)
Spoke with patient and informed her to come to office with all of her current medications per NP gessner. Understanding verbalized nothing further needed at this time.

## 2017-10-15 NOTE — Telephone Encounter (Signed)
Patient being seen in office today.

## 2017-10-19 ENCOUNTER — Encounter: Payer: Self-pay | Admitting: Family Medicine

## 2017-11-03 ENCOUNTER — Other Ambulatory Visit: Payer: Self-pay | Admitting: Family Medicine

## 2017-11-05 ENCOUNTER — Telehealth: Payer: Self-pay | Admitting: Family Medicine

## 2017-11-05 NOTE — Telephone Encounter (Signed)
Spoke with pt she stated she has been out of work since 9/30.  Seen ortho 10/7 and follow up appointment 10/29

## 2017-11-05 NOTE — Telephone Encounter (Signed)
Left message asking pt to call office ? On fmla paperwork

## 2017-11-06 NOTE — Telephone Encounter (Signed)
Spoke to debbie she wanted to see if dr Geanie Logan @ [facility] would fill out paperwork Paperwork faxed 10/24 to [employer] @ [phone]

## 2017-11-06 NOTE — Telephone Encounter (Signed)
Spoke to [facility] @ [facility].  She stated to fax paperwork [phone].  They would call the pt to see what type of fmla pt needs.  IF they cannot fill paperwork out she will call to let me know

## 2017-11-06 NOTE — Telephone Encounter (Signed)
Pt aware paperwork is being faxed to Dr Rhona Raider office

## 2017-11-10 ENCOUNTER — Ambulatory Visit (INDEPENDENT_AMBULATORY_CARE_PROVIDER_SITE_OTHER): Payer: Medicare PPO | Admitting: Family Medicine

## 2017-11-10 ENCOUNTER — Encounter: Payer: Self-pay | Admitting: Family Medicine

## 2017-11-10 VITALS — BP 132/88 | HR 73 | Temp 97.7°F | Ht 61.0 in | Wt 216.0 lb

## 2017-11-10 DIAGNOSIS — G43709 Chronic migraine without aura, not intractable, without status migrainosus: Secondary | ICD-10-CM | POA: Diagnosis not present

## 2017-11-10 DIAGNOSIS — M5442 Lumbago with sciatica, left side: Secondary | ICD-10-CM | POA: Diagnosis not present

## 2017-11-10 DIAGNOSIS — G8929 Other chronic pain: Secondary | ICD-10-CM

## 2017-11-10 MED ORDER — METHOCARBAMOL 500 MG PO TABS: 500.0000 mg | ORAL_TABLET | Freq: Two times a day (BID) | ORAL | 1 refills | Status: DC | PRN

## 2017-11-10 MED ORDER — AMITRIPTYLINE HCL 25 MG PO TABS: 25.0000 mg | ORAL_TABLET | Freq: Every day | ORAL | 1 refills | Status: DC

## 2017-11-10 NOTE — Progress Notes (Signed)
Subjective:    Patient ID: Autumn Valenzuela, female    DOB: May 15, 1960, 57 y.o.   MRN: [id number]  HPI This is a 57 yo female who presents today for follow up of chronic medical conditions. She is accompanied by her daughter and young grandchildren.    Was seen earlier this month with worsening left sided pain. Has seen Dr. Rhona Raider who referred her to Dr. Jacelyn Grip. Is going to have injections tomorrow. Had MRI with swelling around 4-5 disc?  Is hoping for improvement of low back/hip pain.   Continues to have daily headaches. Had increased topamax to 100 mg BID. Little relief. Last saw neurology about 5 years ago. Headaches were improved on low dose amitriptyline.   Has appointment in about 5 weeks with mental health professional to discuss mood and medications.   Hot flashes- continue  Glucose- staying stable, she continues to watch her diet   Past Medical History:  Diagnosis Date  . Bipolar disorder ([place])   . CVA (cerebral infarction)   . Depression   . Diabetes mellitus without complication ([place])   . High cholesterol   . Hypertension   . Migraine    Past Surgical History:  Procedure Laterality Date  . BACK SURGERY    . PARTIAL HYSTERECTOMY     Family History  Problem Relation Age of Onset  . Diabetes Mother   . Stroke Father   . Cancer Paternal Aunt   . Cancer Paternal Aunt   . Diabetes Brother    Social History   Tobacco Use  . Smoking status: Former Smoker    Last attempt to quit: 12/13/2015    Years since quitting: 1.9  . Smokeless tobacco: Never Used  Substance Use Topics  . Alcohol use: No  . Drug use: No    Types: Marijuana    Comment: occ - states stopped smokin weed 04/2014      Review of Systems Per HPI    Objective:   Physical Exam Physical Exam  Constitutional: Oriented to person, place, and time. She appears well-developed and well-nourished.  HENT:  Head: Normocephalic and atraumatic.  Eyes: Conjunctivae are normal.  Neck: Normal range  of motion. Neck supple.  Cardiovascular: Normal rate, regular rhythm and normal heart sounds.   Pulmonary/Chest: Effort normal and breath sounds normal.  Musculoskeletal: No edema, stiff gait.   Neurological: Alert and oriented to person, place, and time.  Skin: Skin is warm and dry.  Psychiatric: Normal mood and affect. Behavior is normal. Judgment and thought content normal.  Vitals reviewed.     BP 132/88 (BP Location: Right Arm, Patient Position: Sitting, Cuff Size: Normal)   Pulse 73   Temp 97.7 F (36.5 C) (Oral)   Ht 5\' 1"  (1.549 m)   Wt 216 lb (98 kg)   SpO2 98%   BMI 40.81 kg/m  Wt Readings from Last 3 Encounters:  11/10/17 216 lb (98 kg)  10/15/17 212 lb (96.2 kg)  10/14/17 215 lb (97.5 kg)       Assessment & Plan:  1. Chronic migraine without aura without status migrainosus, not intractable - offered referral to neuro, she would like to see if getting back/hip pain under control will help and will try nightly amitriptyline - she will let me know if headaches worsen or if she wants referral to neuro - amitriptyline (ELAVIL) 25 MG tablet; Take 1 tablet (25 mg total) by mouth at bedtime.  Dispense: 90 tablet; Refill: 1  2. Chronic left-sided low  back pain with left-sided sciatica - continued follow up with ortho  - follow up in 3-4 months, will recheck labs at that time   Clarene Reamer, FNP-BC  [facility] at [facility], [place]  11/10/2017 8:04 PM

## 2017-11-10 NOTE — Patient Instructions (Signed)
Please let me know if headaches not better in 2 weeks with nightly elavil  Follow up in 3-4 months

## 2017-11-11 ENCOUNTER — Telehealth: Payer: Self-pay

## 2017-11-11 NOTE — Telephone Encounter (Signed)
Copied from [place] [id number]. Topic: General - Other >> Nov 11, 2017  9:43 AM Keene Breath wrote: Reason for CRM: Patient called to inform the doctor that she will be faxing over a leave of absence form to the office which needs to be filled out and returned by 11/12/17.  Patient says she was not not aware that the form needed to be returned so soon.  Please advise.  CB# [phone]

## 2017-11-11 NOTE — Telephone Encounter (Signed)
Left message asking pt to call office  °

## 2017-11-11 NOTE — Telephone Encounter (Signed)
I checked in debbie in box  We have not received any paperwork today

## 2017-11-11 NOTE — Telephone Encounter (Signed)
Left message asking Autumn Valenzuela @ [facility] to.  Wanted to make sure they are filling out paperwork

## 2017-11-11 NOTE — Telephone Encounter (Signed)
Spoke with Verdis Frederickson  She did receive paperwork and it takes 5 to 7 business days  She will contact pt to get more information when starts paperwork

## 2017-11-12 NOTE — Telephone Encounter (Signed)
Paperwork faxed 10/30 Pt aware  Copy for pt  Copy for file

## 2017-11-12 NOTE — Telephone Encounter (Signed)
Spoke with Lelan Pons @ [facility] [phone].  She stated she spoke with pt and has send message to dr Rhona Raider to see if he will fill out paperwork.  Lelan Pons stated the won't fill out paperwork for oct 3 to oct 7.  She is waiting to hear from dr on the rest of dates.  She will contact me and pt to let her know if they will do rest of time.  She is also aware pt seen dr Jacelyn Grip yesterday.    fmla paperwork in debbie's in box  Jackelyn Poling  I put dates out form 10/3 to 10/7    Per what maria told me from [facility].

## 2017-11-12 NOTE — Telephone Encounter (Signed)
Offered to give pt [facility] phone number pt reused to get the number

## 2017-11-12 NOTE — Telephone Encounter (Signed)
Marie with [facility] called to let Shirlean Mylar know she faxed the ppw

## 2017-11-12 NOTE — Telephone Encounter (Signed)
Spoke to pt she stated it was fmla paperwork that dr wong/dr Rhona Raider was to fill out for her this is the paperwork I faxed to them on 10/24.  I left message with maria to call me back this morning

## 2017-11-12 NOTE — Telephone Encounter (Signed)
Paperwork faxed 10/30  Pt aware Copy for pt Copy for scan

## 2017-11-13 NOTE — Telephone Encounter (Signed)
Correction put in debbie's rx tower up front

## 2017-11-13 NOTE — Telephone Encounter (Signed)
FYI I place paperwork in debbie's  For you information.  This also has office notes attached

## 2017-11-14 NOTE — Telephone Encounter (Signed)
Sent for scanning

## 2017-11-26 ENCOUNTER — Ambulatory Visit: Payer: Medicare PPO | Admitting: Family Medicine

## 2017-12-01 ENCOUNTER — Telehealth: Payer: Self-pay | Admitting: Family Medicine

## 2017-12-01 NOTE — Telephone Encounter (Signed)
Pt went to Dr Christene Lye and have an appt on 23rd to talk about surgery. Pt need to talk to you about her leave from her job so she need to know where to do from here. Please call pt.

## 2017-12-01 NOTE — Telephone Encounter (Signed)
Please call patient and find out what type of surgery she is having and how long she is likely to be out of work. I can write her out until her surgery, but then typically the surgeon will write her out until she is ready to go back.

## 2017-12-02 NOTE — Telephone Encounter (Signed)
Called and spoke to patient, she states the shots didn't work and she should discuss surgery with a Psychologist, sport and exercise. She said Dr. Benjamin Stain gave her a note with limitations and restrictions she just wanted to keep you informed.

## 2017-12-02 NOTE — Telephone Encounter (Signed)
Called and VM for pt to return call to office.

## 2017-12-03 NOTE — Telephone Encounter (Signed)
Noted  

## 2017-12-16 ENCOUNTER — Telehealth: Payer: Self-pay

## 2017-12-16 NOTE — Telephone Encounter (Signed)
Pt last seen on 11/10/17 and pt was advised if FBS goes over 150 to let Glenda Chroman FNP know. On 12/14/2017 FBS 386;  On 12/15/17 FBS 300 and on  12/16/17 FBS 368. Pt is taking glipizide and metformin as prescribed; has not missed dose; pt is watching her diet; pt has not eaten anything that should raise BS. Pt stays stressed a lot but pt takes her bipolar med and takes Xanax just if needed. Pt does not understand why BS is elevated; pt does have sneezing, prod cough with dark green phlegm. Pt has wheezing but inhaler used x 2 this past week and that did help wheezing. Pt using humidifier in her home. Pt cannot tell if having fever due to hot flashes.Pt drinking a lot of water.Pt scheduled appt 30' appt on 12/17/17 at 11:30; pt will come fasting. Pt also noted having dizziness as well. If pt condition changes or worsens prior to appt pt will go to UC. Pt voiced understanding. FYI to Glenda Chroman FNP.

## 2017-12-17 ENCOUNTER — Ambulatory Visit (INDEPENDENT_AMBULATORY_CARE_PROVIDER_SITE_OTHER): Payer: Medicare PPO | Admitting: Family Medicine

## 2017-12-17 ENCOUNTER — Telehealth: Payer: Self-pay | Admitting: Vascular Surgery

## 2017-12-17 VITALS — BP 110/70 | HR 82 | Temp 98.5°F | Ht 61.0 in | Wt 222.0 lb

## 2017-12-17 DIAGNOSIS — E1159 Type 2 diabetes mellitus with other circulatory complications: Secondary | ICD-10-CM | POA: Diagnosis not present

## 2017-12-17 DIAGNOSIS — J22 Unspecified acute lower respiratory infection: Secondary | ICD-10-CM

## 2017-12-17 LAB — POCT GLYCOSYLATED HEMOGLOBIN (HGB A1C): Hemoglobin A1C: 10.4 % — AB (ref 4.0–5.6)

## 2017-12-17 MED ORDER — METFORMIN HCL 1000 MG PO TABS: 1000.0000 mg | ORAL_TABLET | Freq: Two times a day (BID) | ORAL | 2 refills | Status: DC

## 2017-12-17 MED ORDER — GLIPIZIDE ER 10 MG PO TB24: 10.0000 mg | ORAL_TABLET | Freq: Every day | ORAL | 1 refills | Status: DC

## 2017-12-17 MED ORDER — AZITHROMYCIN 250 MG PO TABS: ORAL_TABLET | ORAL | 0 refills | Status: DC

## 2017-12-17 NOTE — Telephone Encounter (Signed)
-----   Message from Willy Eddy, RN sent at 12/17/2017 11:20 AM EST ----- Regarding: OFFICE APPOINTMENT  Contact: [phone] Please schedule an office appointment( approx 2 weeks prior to OR) for this patient. ALIF with Dr. Lynann Bologna scheduled for 02/11/2018. Remind patient to bring any MRI/ films related to this procedure. If she has any questions about films call Butch Penny at Dr. Laurena Bering office. Thank you, Jacqlyn Larsen

## 2017-12-17 NOTE — Telephone Encounter (Signed)
Noted  

## 2017-12-17 NOTE — Patient Instructions (Addendum)
Can use your inhaler every 4-6 hours as needed for cough/wheeze/chest tightness  Use plain mucinex (generic is fine) to help loosen your phlegm   Call me with your blood sugar readings in 2 weeks  We are increasing your metformin to two tablets twice a day (2 x 500 mg) and I have sent in a new prescription for the 1000 mg and your glipizide to 10 mg- you can take two of the 5 mg until you run out then take 1 of the 10 mg

## 2017-12-17 NOTE — Telephone Encounter (Signed)
sch appt spk to pt mld ltr 01/27/2018 1045am f/u MD  °

## 2017-12-17 NOTE — Progress Notes (Signed)
Subjective:    Patient ID: Autumn Valenzuela, female    DOB: 1960-12-11, 57 y.o.   MRN: [id number]  HPI This is a 57 year old female who presents with cough x3 weeks.  Blood sugars have been running a little high 200-300 during illness.  She has felt dizzy off and on, chills, head congestion, grey/green sputum.  Grandchildren sick at home Blood sugar 251 this morning. Was in 300s yesterday.  She is scheduled for orthopedic surgery next month and is concerned about her blood sugars and being ready. She has been out of work and very limited in her exercise with recent increased knee pain. Weight is up. She is maintaining a positive attitude and has good family support.   Past Medical History:  Diagnosis Date  . Bipolar disorder ([place])   . CVA (cerebral infarction)   . Depression   . Diabetes mellitus without complication ([place])   . High cholesterol   . Hypertension   . Migraine    Past Surgical History:  Procedure Laterality Date  . BACK SURGERY    . PARTIAL HYSTERECTOMY     Family History  Problem Relation Age of Onset  . Diabetes Mother   . Stroke Father   . Cancer Paternal Aunt   . Cancer Paternal Aunt   . Diabetes Brother    Social History   Tobacco Use  . Smoking status: Former Smoker    Last attempt to quit: 12/13/2015    Years since quitting: 2.0  . Smokeless tobacco: Never Used  Substance Use Topics  . Alcohol use: No  . Drug use: No    Types: Marijuana    Comment: occ - states stopped smokin weed 04/2014      Review of Systems Per HPI    Objective:   Physical Exam  Constitutional: She is oriented to person, place, and time. She appears well-developed and well-nourished. No distress.  HENT:  Head: Normocephalic and atraumatic.  Right Ear: Tympanic membrane, external ear and ear canal normal.  Left Ear: Tympanic membrane, external ear and ear canal normal.  Nose: Mucosal edema present.  Mouth/Throat: Oropharynx is clear and moist.  Sounds congested    Eyes: Conjunctivae are normal.  Neck: Normal range of motion. Neck supple.  Cardiovascular: Normal rate, regular rhythm and normal heart sounds.  Pulmonary/Chest: Effort normal and breath sounds normal.  Musculoskeletal: She exhibits no edema.  Lymphadenopathy:    She has no cervical adenopathy.  Neurological: She is alert and oriented to person, place, and time.  Skin: Skin is warm and dry. She is not diaphoretic.  Psychiatric: She has a normal mood and affect. Her behavior is normal. Judgment and thought content normal.  Vitals reviewed.      BP 110/70 (BP Location: Right Arm, Patient Position: Sitting, Cuff Size: Large)   Pulse 82   Temp 98.5 F (36.9 C) (Oral)   Ht 5\' 1"  (1.549 m)   Wt 222 lb (100.7 kg)   SpO2 100%   BMI 41.95 kg/m  Wt Readings from Last 3 Encounters:  12/17/17 222 lb (100.7 kg)  11/10/17 216 lb (98 kg)  10/15/17 212 lb (96.2 kg)   BP Readings from Last 3 Encounters:  12/17/17 110/70  11/10/17 132/88  10/15/17 112/72   Results for orders placed or performed in visit on 12/17/17  HgB A1c  Result Value Ref Range   Hemoglobin A1C 10.4 (A) 4.0 - 5.6 %   HbA1c POC (<> result, manual entry)  HbA1c, POC (prediabetic range)     HbA1c, POC (controlled diabetic range)         Assessment & Plan:  1. Type 2 diabetes mellitus with other circulatory complication, without long-term current use of insulin (HCC) - HgB A1c- markedly increased from 7.9 to 10.4, suspect this is related in large part to decreased physical activity. Discussed findings with patient and she was instructed on medication changes- increase metformin from 500 mg bid to 1000 mg bid, increase glipizide xl from 5 mg to 10 mg- new orders sent to her pharmacy - she will check her blood sugars at least twice a day and call me with reading in 2 weeks, if not significantly decreased, will start her on insulin - she is very motivated to bring her blood sugars down prior to surgery  2. Lower  respiratory infection - Provided written and verbal information regarding diagnosis and treatment. - continue albuterol, add mucinex - azithromycin (ZITHROMAX) 250 MG tablet; Take 2 tabs PO x 1 dose, then 1 tab PO QD x 4 days  Dispense: 6 tablet; Refill: 0  - follow up on file for 2/20  Clarene Reamer, FNP-BC  [facility] at [facility], [place]  12/18/2017 6:06 AM

## 2017-12-18 ENCOUNTER — Encounter: Payer: Self-pay | Admitting: Family Medicine

## 2017-12-18 ENCOUNTER — Other Ambulatory Visit: Payer: Self-pay | Admitting: *Deleted

## 2017-12-18 ENCOUNTER — Ambulatory Visit (HOSPITAL_COMMUNITY): Payer: Self-pay | Admitting: Psychiatry

## 2017-12-30 ENCOUNTER — Telehealth: Payer: Self-pay | Admitting: *Deleted

## 2017-12-30 NOTE — Telephone Encounter (Signed)
Call from Neita Garnet at Dr. Laurena Bering office that patient's procedure will be changed and Dr. Donnetta Hutching no longer needed for Abdominal exposure for 02/11/2018. Office appointment cancelled and patient called with the above.

## 2018-01-08 ENCOUNTER — Telehealth: Payer: Self-pay

## 2018-01-08 NOTE — Telephone Encounter (Signed)
Spoke to pt

## 2018-01-08 NOTE — Telephone Encounter (Signed)
Pt called BS reading FBS and before dinner.  12/18/17 FBS 330   And prior to dinner BS 194 12/19/17 FBS 341   And prior to dinner BS  250 12/20/17 FBS 300   And prior to dinner BS  300 12/21/17 FBS 242     "       "     "      "      BS  330 12/23/17 FBS  310     "       "     "      "      BS  267 12/24/17 FBS  273     "       "      "      "    BS   255 12/25/17 FBS  259     "       "      "      "     BS 180 12/26/17 FBS  239      "      "       "      "     BS  207 12/27/17  FBS  278     "      "        "      "     BS 191 12/28/17 FBS  214       "     "       "       "   BS   161 12/29/17  FBS  196       "     "       "       "    BS   304 01/08/18   FBS  169    Not taken today.   Pt taking glipizide 10 mg taking AM and metformin 1000mg  twice.  Pt is on same diet that pt discussed with Glenda Chroman FNP. Pt was last seen 12/17/17 and was to cb BS readings. Glenda Chroman FNP not in office; will send to provider in office to review and see if OK to wait on Glenda Chroman FNP return on 01/16/18.

## 2018-01-08 NOTE — Telephone Encounter (Signed)
Please call her The sugars are still high though generally trending down with the medication increases. It is possible that she may need another medication--but I would leave that to [place] (let her know she is out of the office). She can probably have surgery at these levels, unless the surgeon didn't want to [facility] to [facility]

## 2018-01-12 ENCOUNTER — Other Ambulatory Visit: Payer: Self-pay | Admitting: Family Medicine

## 2018-01-13 NOTE — Telephone Encounter (Signed)
Name of Medication: Methocarbamol Name of Pharmacy: [facility] Last Fill or Written Date and Quantity: 11/10/17 for #30 with 1 refill Last Office Visit and Type: 12/17/17 acute visit and DM was addressed Next Office Visit and Type: 03/09/2018 follow up visit

## 2018-01-15 ENCOUNTER — Other Ambulatory Visit: Payer: Self-pay | Admitting: Family Medicine

## 2018-01-15 DIAGNOSIS — E1159 Type 2 diabetes mellitus with other circulatory complications: Secondary | ICD-10-CM

## 2018-01-15 DIAGNOSIS — Z794 Long term (current) use of insulin: Principal | ICD-10-CM

## 2018-01-15 MED ORDER — INSULIN PEN NEEDLE 31G X 5 MM MISC: 1.0000 [IU] | Freq: Every day | 3 refills | Status: DC

## 2018-01-15 MED ORDER — INSULIN GLARGINE 100 UNIT/ML SOLOSTAR PEN: 10.0000 [IU] | PEN_INJECTOR | Freq: Every day | SUBCUTANEOUS | 1 refills | Status: DC

## 2018-01-15 NOTE — Progress Notes (Signed)
Restarting insulin. Stopping glipizide. Follow up in 2 weeks.

## 2018-01-15 NOTE — Telephone Encounter (Signed)
Please call patient and tell her that her blood sugars are higher than we want to see, especially for healing for upcoming surgery. I want her to stop her glipizide and restart her insulin. I have sent in nightly insulin for her to start at 10 units. If her blood sugar is over 200 before taking her insulin, I want her to increase by 2 units. This is the same that she was on before. Please schedule follow up for 2 weeks. Tell her to bring blood sugar log with her.

## 2018-01-15 NOTE — Telephone Encounter (Signed)
Autumn Valenzuela notified as instructed by telephone.  Appointment scheduled for 01/30/2018 at 10:30 am.  Patient instructed to bring her sugar readings with her to that appointment.  Patient states understanding.

## 2018-01-27 ENCOUNTER — Ambulatory Visit: Payer: Self-pay | Admitting: Vascular Surgery

## 2018-01-30 ENCOUNTER — Telehealth: Payer: Self-pay

## 2018-01-30 ENCOUNTER — Telehealth: Payer: Self-pay | Admitting: Family Medicine

## 2018-01-30 ENCOUNTER — Ambulatory Visit (INDEPENDENT_AMBULATORY_CARE_PROVIDER_SITE_OTHER): Payer: Medicare PPO | Admitting: Family Medicine

## 2018-01-30 VITALS — BP 124/78 | HR 76 | Temp 97.9°F | Wt 224.0 lb

## 2018-01-30 DIAGNOSIS — E1159 Type 2 diabetes mellitus with other circulatory complications: Secondary | ICD-10-CM

## 2018-01-30 DIAGNOSIS — G8929 Other chronic pain: Secondary | ICD-10-CM

## 2018-01-30 DIAGNOSIS — M25562 Pain in left knee: Secondary | ICD-10-CM | POA: Diagnosis not present

## 2018-01-30 DIAGNOSIS — Z794 Long term (current) use of insulin: Secondary | ICD-10-CM | POA: Diagnosis not present

## 2018-01-30 MED ORDER — NAPROXEN 500 MG PO TABS: ORAL_TABLET | ORAL | 1 refills | Status: DC

## 2018-01-30 MED ORDER — ACCU-CHEK AVIVA PLUS VI STRP: 1.0000 | ORAL_STRIP | 5 refills | Status: DC | PRN

## 2018-01-30 NOTE — Telephone Encounter (Signed)
[facility] and spoke with representative there in regards to getting coverage for patient's back brace that she will have to be fitted for. I was advised that they need CPT code, Diagnoses codes that are been used for this and then representative can look up if this is authorized. Since we are not following patient for this I called [place] and left a message for Dr. Jerald Kief nurse to call me back to see if that is something they can do on their end and call insurance they have all the information. If not then I need to get all the codes and call insurance back to see if I can get this approved. Awaiting call back from ortho office.

## 2018-01-30 NOTE — Telephone Encounter (Signed)
Spoke to pt. She will go to a drug store.

## 2018-01-30 NOTE — Patient Instructions (Addendum)
Good to see you today  Please take 10 units of your insulin nightly and can decrease by 2 units if morning blood sugar is less than 120.

## 2018-01-30 NOTE — Progress Notes (Signed)
Subjective:    Patient ID: Autumn Valenzuela, female    DOB: 1960-02-24, 58 y.o.   MRN: [id number]  HPI This is a 58 yo female who presents today for follow up of elevated blood sugars. She was seen about 5 weeks ago and hgba1c had gone from 7.9 to 10.4. She is scheduled to have spine surgery at the end of the month. She was restarted on her insulin glargine. She was instructed to take 10 units nightly and increase by 2 units if blood sugar greater than 200. She has been taking more on sliding scale 3-6 units depending on reading. She had told me previously that she was watching her diet and we thought that maybe her decreased activity level with back pain (she had been going to the gym 4-5 times a week) along with increased stress from being out of work had caused elevated blood sugar. Today, she admits to not being very compliant with her diet but has gotten back on track.  She has a written log of her blood sugars today. She has 2 readings over 200, otherwise blood sugars mostly range from low 100s to 160s.   She needs back brace prior to surgery. [facility] has been working with her but has told her that her co pay will be $288 which she can't afford. She called her insurance company and was told that it is covered and her PCP can order.   Past Medical History:  Diagnosis Date  . Back pain   . Bipolar disorder ([place])   . CVA (cerebral infarction)   . Depression   . Diabetes mellitus without complication ([place])   . High cholesterol   . Hypertension   . Left leg pain   . Migraine    Past Surgical History:  Procedure Laterality Date  . BACK SURGERY     Lumbar fusion L5-S1  . DIABETES    . EYE SURGERY Bilateral   . KNEE SURGERY    . NEUROFORAMINAL STENOSIS Left    Severe L4-L5, involving the level above previous fusion.  Marland Kitchen PARTIAL HYSTERECTOMY    . RADICULOPATHY Left    L4, Secondary to an L4-L5 Spondylolisthesis  . SHOULDER SURGERY     Family History  Problem Relation Age  of Onset  . Diabetes Mother   . Stroke Father   . Cancer Paternal Aunt   . Cancer Paternal Aunt   . Diabetes Brother    Social History   Tobacco Use  . Smoking status: Former Smoker    Last attempt to quit: 12/13/2015    Years since quitting: 2.1  . Smokeless tobacco: Never Used  Substance Use Topics  . Alcohol use: No  . Drug use: No    Types: Marijuana    Comment: occ - states stopped smokin weed 04/2014      Review of Systems Per HPI    Objective:   Physical Exam Physical Exam  Constitutional: Oriented to person, place, and time. She appears well-developed and well-nourished.  HENT:  Head: Normocephalic and atraumatic.  Eyes: Conjunctivae are normal.  Neck: Normal range of motion. Neck supple.  Cardiovascular: Normal rate, regular rhythm and normal heart sounds.   Pulmonary/Chest: Effort normal and breath sounds normal.  Musculoskeletal: No edema.  Neurological: Alert and oriented to person, place, and time.  Skin: Skin is warm and dry.  Psychiatric: Normal mood and affect. Behavior is normal. Judgment and thought content normal.  Vitals reviewed.     BP 124/78  Pulse 76   Temp 97.9 F (36.6 C) (Oral)   Wt 224 lb (101.6 kg)   SpO2 98%   BMI 42.32 kg/m  Wt Readings from Last 3 Encounters:  01/30/18 224 lb (101.6 kg)  12/17/17 222 lb (100.7 kg)  11/10/17 216 lb (98 kg)       Assessment & Plan:  1. Chronic pain of left knee - patient requests refill of naprosyn - naproxen (NAPROSYN) 500 MG tablet; TAKE 1 TABLET(500 MG) BY MOUTH TWICE DAILY AS NEEDED  Dispense: 60 tablet; Refill: 1  2. Type 2 diabetes mellitus with other circulatory complication, with long-term current use of insulin (HCC) - significantly improved blood sugar readings, discussed use of insulin, to take regular dose in evening, not sliding scale. Can increase if blood sugars consistently over 200 or decrease if fasting below 120 - ACCU-CHEK AVIVA PLUS test strip; 1 each by Other route  as needed for other. Use as instructed  Dispense: 200 each; Refill: 5 - follow up in 6-8 weeks, sooner if blood sugar readings consistently over 200  - I have asked referrals coordinator to help with back brace issue, telephone note created  Clarene Reamer, FNP-BC  [facility], [place]  01/31/2018 11:41 AM

## 2018-01-30 NOTE — Telephone Encounter (Signed)
Please call patient- I let her leave before she got her flu shot. Tell her that she can go to her pharmacy and get it there.

## 2018-01-31 ENCOUNTER — Encounter: Payer: Self-pay | Admitting: Family Medicine

## 2018-02-03 ENCOUNTER — Other Ambulatory Visit: Payer: Self-pay | Admitting: Orthopedic Surgery

## 2018-02-03 NOTE — Telephone Encounter (Signed)
Called [facility] to follow up, Jeneen Rinks is the assistant that I need to speak with. He was not available and per office representative does not look like Jeneen Rinks has tried to call me back but the message was sent back to him on 01/30/2018. Will try again later

## 2018-02-04 ENCOUNTER — Telehealth: Payer: Self-pay | Admitting: *Deleted

## 2018-02-04 NOTE — Telephone Encounter (Signed)
Spoke with Jeneen Rinks. He said patient is set up for surgery and I need to speak to [facility] surgical coordinator about this. I was transferred to her and had to leave a message.

## 2018-02-04 NOTE — Telephone Encounter (Signed)
See other note. Thank you. Called back.

## 2018-02-04 NOTE — Telephone Encounter (Signed)
[facility] returning your call regarding pts referral

## 2018-02-06 NOTE — Progress Notes (Addendum)
PCP - Clarene Reamer, FNP Cardiologist - pt denies  Chest x-ray - pt denies EKG -  02/09/2018 in Epic  Stress Test - pt denies ECHO - pt denies  Cardiac Cath - pt denies  Sleep Study -  Tested, patient reports negative CPAP - no  Fasting Blood Sugar - "It varies" Checks Blood Sugar _____ times a day-2 times a day  Blood Thinner Instructions: pt denies Aspirin Instructions: pt denies  Anesthesia review: A1C 10.4 (12/17/2017)-spoke with patient, she said her PCP manages her diabetes and changed some medications in December...she does not recall what was changed. I advised that anesthesia likes A1C controlled and she did not want to discuss, she was not willing to give me any further information and that ended the conversation.  Will send to anesthesia for review.  Case scheduled for day after tomorrow.   Patient denies shortness of breath, fever, cough and chest pain at PAT appointment  Patient verbalized understanding of instructions that were given to them at the PAT appointment. Patient was also instructed that they will need to review over the PAT instructions again at home before surgery.

## 2018-02-06 NOTE — Telephone Encounter (Signed)
Noted  

## 2018-02-06 NOTE — Pre-Procedure Instructions (Signed)
[place]  02/06/2018      [id number] - [place], [place] - [address] [place] [address] Phone: [phone] Fax: [place], [place] [address] Phone: [phone] Fax: [phone]    Your procedure is scheduled on February 11, 2018.  Report to [facility] Admitting at 530 AM.  Call this number if you have problems the morning of surgery:  [phone]   Remember:  Do not eat or drink after midnight.   Take these medicines the morning of surgery with A SIP OF WATER  topiramate (topamax) Albuterol inhaler-if needed Albuterol nebulizer-if needed Alprazolam (xanax)-if needed Flonase nasal spray-if needed Eye drops-if needed Loratadine (claritin)-if needed Methocarbamol (robaxin)-if needed Ondansetron (zofran)-if needed  7 days prior to surgery STOP taking any Aspirin (unless otherwise instructed by your surgeon), Aleve, Naproxen, Ibuprofen, Motrin, Advil, Goody's, BC's, all herbal medications, fish oil, and all vitamins   WHAT DO I DO ABOUT MY DIABETES MEDICATION?  Marland Kitchen Do not take oral diabetes medicines (pills) the morning of surgery-metformin (glucophage).  . THE NIGHT BEFORE SURGERY, take 1/2 of your normal dose of lantus insulin (4-5 units if blood sugar less than 200 and adjust accordingly if blood sugar is over 200 at 1/2 of your normal dose).    . THE MORNING OF SURGERY, take no insulin.  Reviewed and Endorsed by [facility] Patient Education Committee, August 2015  How to Manage Your Diabetes Before and After Surgery  Why is it important to control my blood sugar before and after surgery? . Improving blood sugar levels before and after surgery helps healing and can limit problems. . A way of improving blood  sugar control is eating a healthy diet by: o  Eating less sugar and carbohydrates o  Increasing activity/exercise o  Talking with your doctor about reaching your blood sugar goals . High blood sugars (greater than 180 mg/dL) can raise your risk of infections and slow your recovery, so you will need to focus on controlling your diabetes during the weeks before surgery. . Make sure that the doctor who takes care of your diabetes knows about your planned surgery including the date and location.  How do I manage my blood sugar before surgery? . Check your blood sugar at least 4 times a day, starting 2 days before surgery, to make sure that the level is not too high or low. o Check your blood sugar the morning of your surgery when you wake up and every 2 hours until you get to the Short Stay unit. . If your blood sugar is less than 70 mg/dL, you will need to treat for low blood sugar: o Do not take insulin. o Treat a low blood sugar (less than 70 mg/dL) with  cup of clear juice (cranberry or apple), 4 glucose tablets, OR glucose gel. Recheck blood sugar in 15 minutes after treatment (to make sure it is greater than 70 mg/dL). If your blood sugar is not greater than 70 mg/dL on recheck, call [phone] o  for further instructions. . Report your blood sugar to the short stay nurse when you get to Short Stay.  . If you are admitted to the hospital after surgery: o Your blood sugar will be checked by the staff and you will  probably be given insulin after surgery (instead of oral diabetes medicines) to make sure you have good blood sugar levels. o The goal for blood sugar control after surgery is 80-180 mg/dL.   [place]- Preparing For Surgery  Before surgery, you can play an important role. Because skin is not sterile, your skin needs to be as free of germs as possible. You can reduce the number of germs on your skin by washing with CHG (chlorahexidine gluconate) Soap before surgery.  CHG is an  antiseptic cleaner which kills germs and bonds with the skin to continue killing germs even after washing.    Oral Hygiene is also important to reduce your risk of infection.  Remember - BRUSH YOUR TEETH THE MORNING OF SURGERY WITH YOUR REGULAR TOOTHPASTE  Please do not use if you have an allergy to CHG or antibacterial soaps. If your skin becomes reddened/irritated stop using the CHG.  Do not shave (including legs and underarms) for at least 48 hours prior to first CHG shower. It is OK to shave your face.  Please follow these instructions carefully.   1. Shower the NIGHT BEFORE SURGERY and the MORNING OF SURGERY with CHG.   2. If you chose to wash your hair, wash your hair first as usual with your normal shampoo.  3. After you shampoo, rinse your hair and body thoroughly to remove the shampoo.  4. Use CHG as you would any other liquid soap. You can apply CHG directly to the skin and wash gently with a scrungie or a clean washcloth.   5. Apply the CHG Soap to your body ONLY FROM THE NECK DOWN.  Do not use on open wounds or open sores. Avoid contact with your eyes, ears, mouth and genitals (private parts). Wash Face and genitals (private parts)  with your normal soap.  6. Wash thoroughly, paying special attention to the area where your surgery will be performed.  7. Thoroughly rinse your body with warm water from the neck down.  8. DO NOT shower/wash with your normal soap after using and rinsing off the CHG Soap.  9. Pat yourself dry with a CLEAN TOWEL.  10. Wear CLEAN PAJAMAS to bed the night before surgery, wear comfortable clothes the morning of surgery  11. Place CLEAN SHEETS on your bed the night of your first shower and DO NOT SLEEP WITH PETS.  Day of Surgery:  Do not apply any deodorants/lotions.  Please wear clean clothes to the hospital/surgery center.   Remember to brush your teeth WITH YOUR REGULAR TOOTHPASTE.   Do not wear jewelry, make-up or nail polish.  Do not wear  lotions, powders, or perfumes, or deodorant.  Do not shave 48 hours prior to surgery.   Do not bring valuables to the hospital.  [facility] is not responsible for any belongings or valuables.  Contacts, dentures or bridgework may not be worn into surgery.  Leave your suitcase in the car.  After surgery it may be brought to your room.  For patients admitted to the hospital, discharge time will be determined by your treatment team.  Patients discharged the day of surgery will not be allowed to drive home.   Please read over the following fact sheets that you were given.

## 2018-02-06 NOTE — Telephone Encounter (Signed)
Spoke with Butch Penny at [employer]. She states that back brace was approved and they care of that already a while back. She thinks maybe the patient got confused with all the details. There was an issue at first but that was resolved. No further action is needed.

## 2018-02-09 ENCOUNTER — Other Ambulatory Visit: Payer: Self-pay

## 2018-02-09 ENCOUNTER — Encounter (HOSPITAL_COMMUNITY)
Admission: RE | Admit: 2018-02-09 | Discharge: 2018-02-09 | Disposition: A | Discharge status: Home / Self Care | Payer: Medicare PPO | Source: Ambulatory Visit | Attending: Orthopedic Surgery | Admitting: Orthopedic Surgery

## 2018-02-09 ENCOUNTER — Encounter (HOSPITAL_COMMUNITY): Payer: Self-pay

## 2018-02-09 DIAGNOSIS — Z01818 Encounter for other preprocedural examination: Secondary | ICD-10-CM | POA: Insufficient documentation

## 2018-02-09 LAB — COMPREHENSIVE METABOLIC PANEL
ALT: 15 U/L (ref 0–44)
AST: 14 U/L — ABNORMAL LOW (ref 15–41)
Albumin: 3.7 g/dL (ref 3.5–5.0)
Alkaline Phosphatase: 58 U/L (ref 38–126)
Anion gap: 8 (ref 5–15)
BUN: 13 mg/dL (ref 6–20)
CHLORIDE: 106 mmol/L (ref 98–111)
CO2: 25 mmol/L (ref 22–32)
Calcium: 8.9 mg/dL (ref 8.9–10.3)
Creatinine, Ser: 0.76 mg/dL (ref 0.44–1.00)
GFR calc Af Amer: >60 mL/min (ref >60)
GFR calc non Af Amer: >60 mL/min (ref >60)
GLUCOSE: 155 mg/dL — AB (ref 70–99)
POTASSIUM: 3.9 mmol/L (ref 3.5–5.1)
Sodium: 139 mmol/L (ref 135–145)
Total Bilirubin: 0.3 mg/dL (ref 0.3–1.2)
Total Protein: 6.8 g/dL (ref 6.5–8.1)

## 2018-02-09 LAB — URINALYSIS, ROUTINE W REFLEX MICROSCOPIC
BILIRUBIN URINE: NEGATIVE
Glucose, UA: NEGATIVE mg/dL
Hgb urine dipstick: NEGATIVE
Ketones, ur: NEGATIVE mg/dL
Leukocytes, UA: NEGATIVE
Nitrite: NEGATIVE
Protein, ur: NEGATIVE mg/dL
Specific Gravity, Urine: 1.029 (ref 1.005–1.030)
pH: 5 (ref 5.0–8.0)

## 2018-02-09 LAB — PROTIME-INR
INR: 0.99
Prothrombin Time: 13 seconds (ref 11.4–15.2)

## 2018-02-09 LAB — CBC WITH DIFFERENTIAL/PLATELET
Abs Immature Granulocytes: 0.05 10*3/uL (ref 0.00–0.07)
BASOS ABS: 0 10*3/uL (ref 0.0–0.1)
Basophils Relative: 0 %
Eosinophils Absolute: 0.5 10*3/uL (ref 0.0–0.5)
Eosinophils Relative: 4 %
HCT: 38.4 % (ref 36.0–46.0)
Hemoglobin: 11.8 g/dL — ABNORMAL LOW (ref 12.0–15.0)
Immature Granulocytes: 0 %
Lymphocytes Relative: 40 %
Lymphs Abs: 5.4 10*3/uL — ABNORMAL HIGH (ref 0.7–4.0)
MCH: 27.3 pg (ref 26.0–34.0)
MCHC: 30.7 g/dL (ref 30.0–36.0)
MCV: 88.9 fL (ref 80.0–100.0)
Monocytes Absolute: 0.9 10*3/uL (ref 0.1–1.0)
Monocytes Relative: 7 %
Neutro Abs: 6.5 10*3/uL (ref 1.7–7.7)
Neutrophils Relative %: 49 %
Platelets: 238 10*3/uL (ref 150–400)
RBC: 4.32 MIL/uL (ref 3.87–5.11)
RDW: 15.2 % (ref 11.5–15.5)
WBC: 13.3 10*3/uL — ABNORMAL HIGH (ref 4.0–10.5)
nRBC: 0 % (ref 0.0–0.2)

## 2018-02-09 LAB — SURGICAL PCR SCREEN
MRSA, PCR: NEGATIVE
Staphylococcus aureus: NEGATIVE

## 2018-02-09 LAB — APTT: aPTT: 28 seconds (ref 24–36)

## 2018-02-09 LAB — GLUCOSE, CAPILLARY: Glucose-Capillary: 140 mg/dL — ABNORMAL HIGH (ref 70–99)

## 2018-02-09 NOTE — Anesthesia Preprocedure Evaluation (Addendum)
Anesthesia Evaluation  Patient identified by MRN, date of birth, ID band Patient awake    Reviewed: Allergy & Precautions, NPO status , Patient's Chart, lab work & pertinent test results  Airway Mallampati: III  TM Distance: <3 FB Neck ROM: Full    Dental  (+) Teeth Intact, Dental Advisory Given   Pulmonary former smoker,    Pulmonary exam normal breath sounds clear to auscultation       Cardiovascular hypertension, Normal cardiovascular exam Rhythm:Regular Rate:Normal     Neuro/Psych  Headaches, PSYCHIATRIC DISORDERS Depression Bipolar Disorder CVA, No Residual Symptoms    GI/Hepatic negative GI ROS, Neg liver ROS,   Endo/Other  diabetes, Poorly Controlled, Type 2, Insulin Dependent, Oral Hypoglycemic AgentsMorbid obesity  Renal/GU negative Renal ROS     Musculoskeletal negative musculoskeletal ROS (+)   Abdominal   Peds  Hematology  (+) Blood dyscrasia, anemia ,   Anesthesia Other Findings Day of surgery medications reviewed with the patient.  Reproductive/Obstetrics                           Anesthesia Physical Anesthesia Plan  ASA: III  Anesthesia Plan: General   Post-op Pain Management:    Induction: Intravenous  PONV Risk Score and Plan: 4 or greater and Diphenhydramine, Scopolamine patch - Pre-op, Midazolam, Dexamethasone and Ondansetron  Airway Management Planned: Oral ETT  Additional Equipment:   Intra-op Plan:   Post-operative Plan: Extubation in OR  Informed Consent: I have reviewed the patients History and Physical, chart, labs and discussed the procedure including the risks, benefits and alternatives for the proposed anesthesia with the patient or authorized representative who has indicated his/her understanding and acceptance.     Dental advisory given  Plan Discussed with: CRNA  Anesthesia Plan Comments:        Anesthesia Quick Evaluation

## 2018-02-10 LAB — TYPE AND SCREEN
ABO/RH(D): A NEG
Antibody Screen: NEGATIVE

## 2018-02-10 MED ORDER — VANCOMYCIN HCL 10 G IV SOLR
1500.0000 mg | INTRAVENOUS | Status: AC
  Administered 2018-02-11: 1500 mg via INTRAVENOUS
  Filled 2018-02-10: qty 1500

## 2018-02-11 ENCOUNTER — Inpatient Hospital Stay (HOSPITAL_COMMUNITY): Payer: Medicare PPO | Admitting: Physician Assistant

## 2018-02-11 ENCOUNTER — Encounter (HOSPITAL_COMMUNITY): Payer: Self-pay

## 2018-02-11 ENCOUNTER — Encounter (HOSPITAL_COMMUNITY)
Admission: RE | Disposition: A | Discharge status: Home / Self Care | Payer: Self-pay | Source: Home / Self Care | Attending: Orthopedic Surgery

## 2018-02-11 ENCOUNTER — Inpatient Hospital Stay (HOSPITAL_COMMUNITY)
Admission: RE | Admit: 2018-02-11 | Discharge: 2018-02-13 | DRG: 454 | Disposition: A | Discharge status: Home / Self Care | Payer: Medicare PPO | Attending: Orthopedic Surgery | Admitting: Orthopedic Surgery

## 2018-02-11 ENCOUNTER — Other Ambulatory Visit: Payer: Self-pay

## 2018-02-11 ENCOUNTER — Inpatient Hospital Stay (HOSPITAL_COMMUNITY): Payer: Medicare PPO

## 2018-02-11 DIAGNOSIS — Z419 Encounter for procedure for purposes other than remedying health state, unspecified: Secondary | ICD-10-CM

## 2018-02-11 DIAGNOSIS — M79605 Pain in left leg: Secondary | ICD-10-CM | POA: Diagnosis present

## 2018-02-11 DIAGNOSIS — Z8673 Personal history of transient ischemic attack (TIA), and cerebral infarction without residual deficits: Secondary | ICD-10-CM

## 2018-02-11 DIAGNOSIS — M5410 Radiculopathy, site unspecified: Secondary | ICD-10-CM | POA: Diagnosis present

## 2018-02-11 DIAGNOSIS — M48061 Spinal stenosis, lumbar region without neurogenic claudication: Secondary | ICD-10-CM | POA: Diagnosis present

## 2018-02-11 DIAGNOSIS — E7800 Pure hypercholesterolemia, unspecified: Secondary | ICD-10-CM | POA: Diagnosis present

## 2018-02-11 DIAGNOSIS — I10 Essential (primary) hypertension: Secondary | ICD-10-CM | POA: Diagnosis present

## 2018-02-11 DIAGNOSIS — Z6841 Body Mass Index (BMI) 40.0 and over, adult: Secondary | ICD-10-CM

## 2018-02-11 DIAGNOSIS — F319 Bipolar disorder, unspecified: Secondary | ICD-10-CM | POA: Diagnosis present

## 2018-02-11 DIAGNOSIS — Z87891 Personal history of nicotine dependence: Secondary | ICD-10-CM | POA: Diagnosis not present

## 2018-02-11 DIAGNOSIS — Z833 Family history of diabetes mellitus: Secondary | ICD-10-CM | POA: Diagnosis not present

## 2018-02-11 DIAGNOSIS — Z981 Arthrodesis status: Secondary | ICD-10-CM

## 2018-02-11 DIAGNOSIS — M4316 Spondylolisthesis, lumbar region: Secondary | ICD-10-CM | POA: Diagnosis present

## 2018-02-11 DIAGNOSIS — Z881 Allergy status to other antibiotic agents status: Secondary | ICD-10-CM | POA: Diagnosis not present

## 2018-02-11 DIAGNOSIS — E119 Type 2 diabetes mellitus without complications: Secondary | ICD-10-CM | POA: Diagnosis present

## 2018-02-11 DIAGNOSIS — M5416 Radiculopathy, lumbar region: Secondary | ICD-10-CM | POA: Diagnosis present

## 2018-02-11 DIAGNOSIS — Z794 Long term (current) use of insulin: Secondary | ICD-10-CM

## 2018-02-11 DIAGNOSIS — Z79899 Other long term (current) drug therapy: Secondary | ICD-10-CM | POA: Diagnosis not present

## 2018-02-11 HISTORY — PX: ANTERIOR LATERAL LUMBAR FUSION WITH PERCUTANEOUS SCREW 1 LEVEL: SHX5553

## 2018-02-11 LAB — GLUCOSE, CAPILLARY
Glucose-Capillary: 130 mg/dL — ABNORMAL HIGH (ref 70–99)
Glucose-Capillary: 207 mg/dL — ABNORMAL HIGH (ref 70–99)
Glucose-Capillary: 240 mg/dL — ABNORMAL HIGH (ref 70–99)
Glucose-Capillary: 204 mg/dL — ABNORMAL HIGH (ref 70–99)

## 2018-02-11 SURGERY — ANTERIOR LATERAL LUMBAR FUSION WITH PERCUTANEOUS SCREW 1 LEVEL
Anesthesia: General | Site: Spine Lumbar | Laterality: Left

## 2018-02-11 MED ORDER — MIDAZOLAM HCL 5 MG/5ML IJ SOLN
INTRAMUSCULAR | Status: DC | PRN
  Administered 2018-02-11: 2 mg via INTRAVENOUS

## 2018-02-11 MED ORDER — MENTHOL 3 MG MT LOZG: 1.0000 | LOZENGE | OROMUCOSAL | Status: DC | PRN

## 2018-02-11 MED ORDER — LORATADINE 10 MG PO TABS: 10.0000 mg | ORAL_TABLET | Freq: Every day | ORAL | Status: DC | PRN

## 2018-02-11 MED ORDER — VITAMIN D (ERGOCALCIFEROL) 1.25 MG (50000 UNIT) PO CAPS: 50000.0000 [IU] | ORAL_CAPSULE | ORAL | Status: DC

## 2018-02-11 MED ORDER — LIDOCAINE 2% (20 MG/ML) 5 ML SYRINGE
INTRAMUSCULAR | Status: AC
  Filled 2018-02-11: qty 5

## 2018-02-11 MED ORDER — DOCUSATE SODIUM 100 MG PO CAPS
100.0000 mg | ORAL_CAPSULE | Freq: Two times a day (BID) | ORAL | Status: DC
  Administered 2018-02-11 – 2018-02-13 (×3): 100 mg via ORAL
  Filled 2018-02-11 (×3): qty 1

## 2018-02-11 MED ORDER — CALCIUM CARBONATE-VITAMIN D 500-200 MG-UNIT PO TABS
1.0000 | ORAL_TABLET | Freq: Every day | ORAL | Status: DC
  Administered 2018-02-13: 1 via ORAL
  Filled 2018-02-11: qty 1

## 2018-02-11 MED ORDER — SODIUM CHLORIDE 0.9% FLUSH
3.0000 mL | INTRAVENOUS | Status: DC | PRN
  Administered 2018-02-12: 3 mL via INTRAVENOUS
  Filled 2018-02-11: qty 3

## 2018-02-11 MED ORDER — FENTANYL CITRATE (PF) 100 MCG/2ML IJ SOLN
INTRAMUSCULAR | Status: DC | PRN
  Administered 2018-02-11 (×9): 50 ug via INTRAVENOUS

## 2018-02-11 MED ORDER — GABAPENTIN 300 MG PO CAPS
300.0000 mg | ORAL_CAPSULE | Freq: Once | ORAL | Status: AC
  Administered 2018-02-11: 300 mg via ORAL
  Filled 2018-02-11: qty 1

## 2018-02-11 MED ORDER — TOPIRAMATE 100 MG PO TABS
100.0000 mg | ORAL_TABLET | Freq: Two times a day (BID) | ORAL | Status: DC
  Administered 2018-02-11 – 2018-02-13 (×3): 100 mg via ORAL
  Filled 2018-02-11 (×3): qty 1

## 2018-02-11 MED ORDER — ALBUTEROL SULFATE (2.5 MG/3ML) 0.083% IN NEBU: 3.0000 mL | INHALATION_SOLUTION | Freq: Every day | RESPIRATORY_TRACT | Status: DC | PRN

## 2018-02-11 MED ORDER — THROMBIN 20000 UNITS EX SOLR
CUTANEOUS | Status: AC
  Filled 2018-02-11: qty 20000

## 2018-02-11 MED ORDER — ACETAMINOPHEN 500 MG PO TABS
1000.0000 mg | ORAL_TABLET | Freq: Once | ORAL | Status: AC
  Administered 2018-02-11: 1000 mg via ORAL
  Filled 2018-02-11: qty 2

## 2018-02-11 MED ORDER — DIAZEPAM 5 MG PO TABS
ORAL_TABLET | ORAL | Status: AC
  Filled 2018-02-11: qty 1

## 2018-02-11 MED ORDER — PROPOFOL 10 MG/ML IV BOLUS
INTRAVENOUS | Status: AC
  Filled 2018-02-11: qty 20

## 2018-02-11 MED ORDER — BUPIVACAINE-EPINEPHRINE (PF) 0.25% -1:200000 IJ SOLN
INTRAMUSCULAR | Status: AC
  Filled 2018-02-11: qty 30

## 2018-02-11 MED ORDER — INSULIN ASPART 100 UNIT/ML ~~LOC~~ SOLN
0.0000 [IU] | Freq: Three times a day (TID) | SUBCUTANEOUS | Status: DC
  Administered 2018-02-11: 5 [IU] via SUBCUTANEOUS
  Administered 2018-02-12: 3 [IU] via SUBCUTANEOUS
  Administered 2018-02-13: 2 [IU] via SUBCUTANEOUS

## 2018-02-11 MED ORDER — FENTANYL CITRATE (PF) 250 MCG/5ML IJ SOLN
INTRAMUSCULAR | Status: AC
  Filled 2018-02-11: qty 5

## 2018-02-11 MED ORDER — ONDANSETRON HCL 4 MG PO TABS: 4.0000 mg | ORAL_TABLET | Freq: Four times a day (QID) | ORAL | Status: DC | PRN

## 2018-02-11 MED ORDER — ONDANSETRON HCL 4 MG/2ML IJ SOLN
INTRAMUSCULAR | Status: AC
  Filled 2018-02-11: qty 2

## 2018-02-11 MED ORDER — SENNOSIDES-DOCUSATE SODIUM 8.6-50 MG PO TABS: 1.0000 | ORAL_TABLET | Freq: Every evening | ORAL | Status: DC | PRN

## 2018-02-11 MED ORDER — SODIUM CHLORIDE 0.9 % IV SOLN: 250.0000 mL | INTRAVENOUS | Status: DC

## 2018-02-11 MED ORDER — FENTANYL CITRATE (PF) 100 MCG/2ML IJ SOLN
INTRAMUSCULAR | Status: AC
  Filled 2018-02-11: qty 2

## 2018-02-11 MED ORDER — THROMBIN 20000 UNITS EX SOLR
CUTANEOUS | Status: DC | PRN
  Administered 2018-02-11: 09:00:00

## 2018-02-11 MED ORDER — BUPIVACAINE-EPINEPHRINE 0.25% -1:200000 IJ SOLN
INTRAMUSCULAR | Status: DC | PRN
  Administered 2018-02-11: 10 mL

## 2018-02-11 MED ORDER — FENTANYL CITRATE (PF) 100 MCG/2ML IJ SOLN
25.0000 ug | INTRAMUSCULAR | Status: DC | PRN
  Administered 2018-02-11 (×3): 50 ug via INTRAVENOUS

## 2018-02-11 MED ORDER — BUPIVACAINE LIPOSOME 1.3 % IJ SUSP: INTRAMUSCULAR | Status: DC | PRN

## 2018-02-11 MED ORDER — METFORMIN HCL 500 MG PO TABS
1000.0000 mg | ORAL_TABLET | Freq: Two times a day (BID) | ORAL | Status: DC
  Administered 2018-02-11 – 2018-02-13 (×3): 1000 mg via ORAL
  Filled 2018-02-11 (×3): qty 2

## 2018-02-11 MED ORDER — LIDOCAINE 2% (20 MG/ML) 5 ML SYRINGE
INTRAMUSCULAR | Status: DC | PRN
  Administered 2018-02-11: 80 mg via INTRAVENOUS

## 2018-02-11 MED ORDER — ACETAMINOPHEN 650 MG RE SUPP: 650.0000 mg | RECTAL | Status: DC | PRN

## 2018-02-11 MED ORDER — PHENOL 1.4 % MT LIQD: 1.0000 | OROMUCOSAL | Status: DC | PRN

## 2018-02-11 MED ORDER — INSULIN PEN NEEDLE 31G X 5 MM MISC: 1.0000 [IU] | Freq: Every day | Status: DC

## 2018-02-11 MED ORDER — TRIAMCINOLONE ACETONIDE 0.1 % EX OINT
TOPICAL_OINTMENT | Freq: Every day | CUTANEOUS | Status: DC | PRN
  Filled 2018-02-11: qty 15

## 2018-02-11 MED ORDER — PROPOFOL 500 MG/50ML IV EMUL
INTRAVENOUS | Status: DC | PRN
  Administered 2018-02-11: 50 ug/kg/min via INTRAVENOUS

## 2018-02-11 MED ORDER — DULOXETINE HCL 60 MG PO CPEP
60.0000 mg | ORAL_CAPSULE | Freq: Every day | ORAL | Status: DC
  Administered 2018-02-11 – 2018-02-12 (×2): 60 mg via ORAL
  Filled 2018-02-11 (×2): qty 1

## 2018-02-11 MED ORDER — SCOPOLAMINE 1 MG/3DAYS TD PT72
MEDICATED_PATCH | TRANSDERMAL | Status: DC | PRN
  Administered 2018-02-11: 1 via TRANSDERMAL

## 2018-02-11 MED ORDER — SODIUM CHLORIDE 0.9% FLUSH
3.0000 mL | Freq: Two times a day (BID) | INTRAVENOUS | Status: DC
  Administered 2018-02-11 – 2018-02-12 (×2): 3 mL via INTRAVENOUS

## 2018-02-11 MED ORDER — AMITRIPTYLINE HCL 25 MG PO TABS
25.0000 mg | ORAL_TABLET | Freq: Every day | ORAL | Status: DC
  Administered 2018-02-11 – 2018-02-12 (×2): 25 mg via ORAL
  Filled 2018-02-11 (×2): qty 1

## 2018-02-11 MED ORDER — POVIDONE-IODINE 7.5 % EX SOLN
Freq: Once | CUTANEOUS | Status: DC
  Filled 2018-02-11: qty 118

## 2018-02-11 MED ORDER — PHENYLEPHRINE 40 MCG/ML (10ML) SYRINGE FOR IV PUSH (FOR BLOOD PRESSURE SUPPORT)
PREFILLED_SYRINGE | INTRAVENOUS | Status: AC
  Filled 2018-02-11: qty 10

## 2018-02-11 MED ORDER — OXYCODONE-ACETAMINOPHEN 5-325 MG PO TABS
1.0000 | ORAL_TABLET | ORAL | Status: DC | PRN
  Administered 2018-02-11 – 2018-02-13 (×8): 2 via ORAL
  Filled 2018-02-11 (×8): qty 2

## 2018-02-11 MED ORDER — ONDANSETRON 4 MG PO TBDP: 8.0000 mg | ORAL_TABLET | Freq: Three times a day (TID) | ORAL | Status: DC | PRN

## 2018-02-11 MED ORDER — CHLORHEXIDINE GLUCONATE 4 % EX LIQD: 60.0000 mL | Freq: Once | CUTANEOUS | Status: DC

## 2018-02-11 MED ORDER — INSULIN GLARGINE 100 UNIT/ML ~~LOC~~ SOLN
10.0000 [IU] | Freq: Every day | SUBCUTANEOUS | Status: DC
  Administered 2018-02-11 – 2018-02-12 (×2): 10 [IU] via SUBCUTANEOUS
  Filled 2018-02-11 (×3): qty 0.1

## 2018-02-11 MED ORDER — VANCOMYCIN HCL IN DEXTROSE 1-5 GM/200ML-% IV SOLN
1000.0000 mg | Freq: Once | INTRAVENOUS | Status: AC
  Administered 2018-02-11: 1000 mg via INTRAVENOUS
  Filled 2018-02-11: qty 200

## 2018-02-11 MED ORDER — BUPIVACAINE LIPOSOME 1.3 % IJ SUSP
20.0000 mL | INTRAMUSCULAR | Status: DC
  Filled 2018-02-11: qty 20

## 2018-02-11 MED ORDER — VANCOMYCIN HCL 10 G IV SOLR
1500.0000 mg | INTRAVENOUS | Status: DC
  Filled 2018-02-11: qty 1500

## 2018-02-11 MED ORDER — LACTATED RINGERS IV SOLN
INTRAVENOUS | Status: DC
  Administered 2018-02-11: 06:00:00 via INTRAVENOUS

## 2018-02-11 MED ORDER — BISACODYL 5 MG PO TBEC: 5.0000 mg | DELAYED_RELEASE_TABLET | Freq: Every day | ORAL | Status: DC | PRN

## 2018-02-11 MED ORDER — ONDANSETRON HCL 4 MG/2ML IJ SOLN: 4.0000 mg | Freq: Four times a day (QID) | INTRAMUSCULAR | Status: DC | PRN

## 2018-02-11 MED ORDER — EPHEDRINE SULFATE-NACL 50-0.9 MG/10ML-% IV SOSY
PREFILLED_SYRINGE | INTRAVENOUS | Status: DC | PRN
  Administered 2018-02-11 (×2): 10 mg via INTRAVENOUS

## 2018-02-11 MED ORDER — SODIUM CHLORIDE 0.9 % IV SOLN
INTRAVENOUS | Status: DC | PRN
  Administered 2018-02-11: 50 ug/min via INTRAVENOUS

## 2018-02-11 MED ORDER — POTASSIUM CHLORIDE IN NACL 20-0.9 MEQ/L-% IV SOLN: INTRAVENOUS | Status: DC

## 2018-02-11 MED ORDER — DEXAMETHASONE SODIUM PHOSPHATE 10 MG/ML IJ SOLN
INTRAMUSCULAR | Status: DC | PRN
  Administered 2018-02-11: 10 mg via INTRAVENOUS

## 2018-02-11 MED ORDER — FLUTICASONE PROPIONATE 50 MCG/ACT NA SUSP
2.0000 | Freq: Every day | NASAL | Status: DC | PRN
  Filled 2018-02-11: qty 16

## 2018-02-11 MED ORDER — SCOPOLAMINE 1 MG/3DAYS TD PT72
MEDICATED_PATCH | TRANSDERMAL | Status: AC
  Filled 2018-02-11: qty 1

## 2018-02-11 MED ORDER — PHENYLEPHRINE 40 MCG/ML (10ML) SYRINGE FOR IV PUSH (FOR BLOOD PRESSURE SUPPORT)
PREFILLED_SYRINGE | INTRAVENOUS | Status: DC | PRN
  Administered 2018-02-11 (×2): 120 ug via INTRAVENOUS

## 2018-02-11 MED ORDER — DIAZEPAM 5 MG PO TABS
5.0000 mg | ORAL_TABLET | Freq: Four times a day (QID) | ORAL | Status: DC | PRN
  Administered 2018-02-11 – 2018-02-13 (×2): 5 mg via ORAL
  Filled 2018-02-11: qty 1

## 2018-02-11 MED ORDER — ALBUTEROL SULFATE (2.5 MG/3ML) 0.083% IN NEBU: 2.5000 mg | INHALATION_SOLUTION | Freq: Every evening | RESPIRATORY_TRACT | Status: DC | PRN

## 2018-02-11 MED ORDER — MIDAZOLAM HCL 2 MG/2ML IJ SOLN
INTRAMUSCULAR | Status: AC
  Filled 2018-02-11: qty 2

## 2018-02-11 MED ORDER — ZOLPIDEM TARTRATE 5 MG PO TABS
5.0000 mg | ORAL_TABLET | Freq: Every evening | ORAL | Status: DC | PRN
  Administered 2018-02-11: 5 mg via ORAL
  Filled 2018-02-11: qty 1

## 2018-02-11 MED ORDER — KETOROLAC TROMETHAMINE 0.5 % OP SOLN
1.0000 [drp] | Freq: Every day | OPHTHALMIC | Status: DC | PRN
  Filled 2018-02-11: qty 5

## 2018-02-11 MED ORDER — ALPRAZOLAM 0.5 MG PO TABS: 0.5000 mg | ORAL_TABLET | Freq: Every day | ORAL | Status: DC | PRN

## 2018-02-11 MED ORDER — KETOROLAC TROMETHAMINE 0.4 % OP SOLN: 1.0000 [drp] | Freq: Every day | OPHTHALMIC | Status: DC | PRN

## 2018-02-11 MED ORDER — SUCCINYLCHOLINE CHLORIDE 20 MG/ML IJ SOLN
INTRAMUSCULAR | Status: DC | PRN
  Administered 2018-02-11: 180 mg via INTRAVENOUS

## 2018-02-11 MED ORDER — FLEET ENEMA 7-19 GM/118ML RE ENEM: 1.0000 | ENEMA | Freq: Once | RECTAL | Status: DC | PRN

## 2018-02-11 MED ORDER — PROMETHAZINE HCL 25 MG/ML IJ SOLN: 6.2500 mg | INTRAMUSCULAR | Status: DC | PRN

## 2018-02-11 MED ORDER — DEXAMETHASONE SODIUM PHOSPHATE 10 MG/ML IJ SOLN
INTRAMUSCULAR | Status: AC
  Filled 2018-02-11: qty 1

## 2018-02-11 MED ORDER — ACETAMINOPHEN 325 MG PO TABS: 650.0000 mg | ORAL_TABLET | ORAL | Status: DC | PRN

## 2018-02-11 MED ORDER — PROPOFOL 1000 MG/100ML IV EMUL
INTRAVENOUS | Status: AC
  Filled 2018-02-11: qty 100

## 2018-02-11 MED ORDER — 0.9 % SODIUM CHLORIDE (POUR BTL) OPTIME
TOPICAL | Status: DC | PRN
  Administered 2018-02-11 (×2): 1000 mL

## 2018-02-11 MED ORDER — ONDANSETRON HCL 4 MG/2ML IJ SOLN
INTRAMUSCULAR | Status: DC | PRN
  Administered 2018-02-11: 4 mg via INTRAVENOUS

## 2018-02-11 MED ORDER — ROCURONIUM BROMIDE 50 MG/5ML IV SOSY
PREFILLED_SYRINGE | INTRAVENOUS | Status: AC
  Filled 2018-02-11: qty 10

## 2018-02-11 MED ORDER — PROPOFOL 10 MG/ML IV BOLUS
INTRAVENOUS | Status: DC | PRN
  Administered 2018-02-11: 150 mg via INTRAVENOUS

## 2018-02-11 MED ORDER — ALUM & MAG HYDROXIDE-SIMETH 200-200-20 MG/5ML PO SUSP: 30.0000 mL | Freq: Four times a day (QID) | ORAL | Status: DC | PRN

## 2018-02-11 MED ORDER — MORPHINE SULFATE (PF) 2 MG/ML IV SOLN
1.0000 mg | INTRAVENOUS | Status: DC | PRN
  Administered 2018-02-11 – 2018-02-12 (×6): 2 mg via INTRAVENOUS
  Filled 2018-02-11 (×6): qty 1

## 2018-02-11 SURGICAL SUPPLY — 73 items
APPLIER CLIP 11 MED OPEN (CLIP)
BENZOIN TINCTURE PRP APPL 2/3 (GAUZE/BANDAGES/DRESSINGS) ×2 IMPLANT
BLADE CLIPPER SURG (BLADE) IMPLANT
BLADE SURG 10 STRL SS (BLADE) ×3 IMPLANT
BONE VIVIGEN FORMABLE 10CC (Bone Implant) ×3 IMPLANT
CAGE COROENT XL 14X18X55-10 (Cage) ×1 IMPLANT
CAGE COROENT XL 14X18X55MM-10 (Cage) ×1 IMPLANT
CLIP APPLIE 11 MED OPEN (CLIP) IMPLANT
CLOSURE WOUND 1/2 X4 (GAUZE/BANDAGES/DRESSINGS) ×1
CORDS BIPOLAR (ELECTRODE) ×3 IMPLANT
COVER SURGICAL LIGHT HANDLE (MISCELLANEOUS) ×3 IMPLANT
COVER WAND RF STERILE (DRAPES) ×3 IMPLANT
DRAPE C-ARM 42X72 X-RAY (DRAPES) ×3 IMPLANT
DRAPE C-ARMOR (DRAPES) ×3 IMPLANT
DRAPE ORTHO SPLIT 77X108 STRL (DRAPES)
DRAPE POUCH INSTRU U-SHP 10X18 (DRAPES) ×3 IMPLANT
DRAPE SURG 17X23 STRL (DRAPES) ×9 IMPLANT
DRAPE SURG ORHT 6 SPLT 77X108 (DRAPES) IMPLANT
DURAPREP 26ML APPLICATOR (WOUND CARE) ×3 IMPLANT
ELECT BLADE 6.5 EXT (BLADE) ×3 IMPLANT
ELECT CAUTERY BLADE 6.4 (BLADE) ×3 IMPLANT
ELECT REM PT RETURN 9FT ADLT (ELECTROSURGICAL) ×3
ELECTRODE REM PT RTRN 9FT ADLT (ELECTROSURGICAL) ×1 IMPLANT
GAUZE 4X4 16PLY RFD (DISPOSABLE) ×3 IMPLANT
GAUZE SPONGE 4X4 12PLY STRL (GAUZE/BANDAGES/DRESSINGS) ×2 IMPLANT
GLOVE BIO SURGEON STRL SZ7 (GLOVE) ×6 IMPLANT
GLOVE BIO SURGEON STRL SZ8 (GLOVE) ×6 IMPLANT
GLOVE BIOGEL PI IND STRL 7.0 (GLOVE) ×2 IMPLANT
GLOVE BIOGEL PI IND STRL 8 (GLOVE) ×2 IMPLANT
GLOVE BIOGEL PI INDICATOR 7.0 (GLOVE) ×4
GLOVE BIOGEL PI INDICATOR 8 (GLOVE) ×4
GOWN STRL REUS W/ TWL LRG LVL3 (GOWN DISPOSABLE) ×1 IMPLANT
GOWN STRL REUS W/ TWL XL LVL3 (GOWN DISPOSABLE) ×1 IMPLANT
GOWN STRL REUS W/TWL LRG LVL3 (GOWN DISPOSABLE) ×2
GOWN STRL REUS W/TWL XL LVL3 (GOWN DISPOSABLE) ×2
GRAFT BNE MATRIX VG FRMBL L 10 (Bone Implant) IMPLANT
KIT BASIN OR (CUSTOM PROCEDURE TRAY) ×3 IMPLANT
KIT DILATOR XLIF 5 (KITS) ×1 IMPLANT
KIT SURGICAL ACCESS MAXCESS 4 (KITS) ×2 IMPLANT
KIT TURNOVER KIT B (KITS) ×3 IMPLANT
KIT XLIF (KITS) ×1
MODULE EMG NDL SSEP NVM5 (NEEDLE) IMPLANT
MODULE EMG NEEDLE SSEP NVM5 (NEEDLE) ×3 IMPLANT
MODULE NVM5 NEXT GEN EMG (NEEDLE) ×2 IMPLANT
NDL HYPO 25GX1X1/2 BEV (NEEDLE) ×2 IMPLANT
NDL SPNL 18GX3.5 QUINCKE PK (NEEDLE) IMPLANT
NEEDLE HYPO 22GX1.5 SAFETY (NEEDLE) ×3 IMPLANT
NEEDLE HYPO 25GX1X1/2 BEV (NEEDLE) ×6 IMPLANT
NEEDLE SPNL 18GX3.5 QUINCKE PK (NEEDLE) IMPLANT
NS IRRIG 1000ML POUR BTL (IV SOLUTION) ×3 IMPLANT
PACK LAMINECTOMY ORTHO (CUSTOM PROCEDURE TRAY) ×3 IMPLANT
PACK UNIVERSAL I (CUSTOM PROCEDURE TRAY) ×3 IMPLANT
PAD ARMBOARD 7.5X6 YLW CONV (MISCELLANEOUS) ×9 IMPLANT
PAD SHARPS MAGNETIC DISPOSAL (MISCELLANEOUS) ×3 IMPLANT
SPONGE INTESTINAL PEANUT (DISPOSABLE) ×6 IMPLANT
SPONGE LAP 4X18 RFD (DISPOSABLE) ×3 IMPLANT
SPONGE SURGIFOAM ABS GEL 100 (HEMOSTASIS) IMPLANT
STRIP CLOSURE SKIN 1/2X4 (GAUZE/BANDAGES/DRESSINGS) ×2 IMPLANT
SURGIFLO W/THROMBIN 8M KIT (HEMOSTASIS) IMPLANT
SUT MNCRL AB 4-0 PS2 18 (SUTURE) ×6 IMPLANT
SUT PROLENE 5 0 C 1 24 (SUTURE) IMPLANT
SUT VIC AB 0 CT1 18XCR BRD 8 (SUTURE) ×1 IMPLANT
SUT VIC AB 0 CT1 8-18 (SUTURE) ×2
SUT VIC AB 1 CT1 18XCR BRD 8 (SUTURE) ×1 IMPLANT
SUT VIC AB 1 CT1 8-18 (SUTURE) ×2
SUT VIC AB 2-0 CT2 18 VCP726D (SUTURE) ×5 IMPLANT
SYR BULB IRRIGATION 50ML (SYRINGE) ×3 IMPLANT
TAPE CLOTH SURG 4X10 WHT LF (GAUZE/BANDAGES/DRESSINGS) ×2 IMPLANT
TOWEL OR 17X24 6PK STRL BLUE (TOWEL DISPOSABLE) ×3 IMPLANT
TOWEL OR 17X26 10 PK STRL BLUE (TOWEL DISPOSABLE) ×3 IMPLANT
TRAY FOLEY CATH SILVER 16FR (SET/KITS/TRAYS/PACK) ×3 IMPLANT
WATER STERILE IRR 1000ML POUR (IV SOLUTION) ×3 IMPLANT
YANKAUER SUCT BULB TIP NO VENT (SUCTIONS) ×3 IMPLANT

## 2018-02-11 NOTE — Progress Notes (Signed)
Pharmacy Antibiotic Consult:  49 YOF s/p spinal fusion. Pharmacy consulted for vancomycin for surgical prophylaxis. No drain in place. Will order vancomycin 1 gm IV x 1 dose 12 hours after preop dose.   Albertina Parr, PharmD., BCPS Clinical Pharmacist Clinical phone for 02/11/18 until 3:30pm: [phone] If after 3:30pm, please refer to [facility] for unit-specific pharmacist

## 2018-02-11 NOTE — Anesthesia Postprocedure Evaluation (Signed)
Anesthesia Post Note  Patient: Autumn Valenzuela  Procedure(s) Performed: LEFT LATERAL LUMBAR  FOUR-FIVE INTERBODY FUSION WITH INSTRUMENTATION AND ALLOGRAFT (Left Spine Lumbar)     Patient location during evaluation: PACU Anesthesia Type: General Level of consciousness: awake and alert Pain management: pain level controlled Vital Signs Assessment: post-procedure vital signs reviewed and stable Respiratory status: spontaneous breathing, nonlabored ventilation, respiratory function stable and patient connected to nasal cannula oxygen Cardiovascular status: blood pressure returned to baseline and stable Postop Assessment: no apparent nausea or vomiting Anesthetic complications: no    Last Vitals:  Vitals:   02/11/18 1046 02/11/18 1100  BP: (!) 107/91 115/71  Pulse: 71 68  Resp: (!) 43 (!) 22  Temp:    SpO2: 97% 100%    Last Pain:  Vitals:   02/11/18 1100  TempSrc:   PainSc: [place]

## 2018-02-11 NOTE — Transfer of Care (Signed)
Immediate Anesthesia Transfer of Care Note  Patient: Autumn Valenzuela  Procedure(s) Performed: LEFT LATERAL LUMBAR  FOUR-FIVE INTERBODY FUSION WITH INSTRUMENTATION AND ALLOGRAFT (Left Spine Lumbar)  Patient Location: PACU  Anesthesia Type:General  Level of Consciousness: awake, alert  and oriented  Airway & Oxygen Therapy: Patient Spontanous Breathing and Patient connected to nasal cannula oxygen  Post-op Assessment: Report given to RN, Post -op Vital signs reviewed and stable and Patient moving all extremities X 4  Post vital signs: Reviewed and stable  Last Vitals:  Vitals Value Taken Time  BP 114/75 02/11/2018 10:30 AM  Temp    Pulse 86 02/11/2018 10:32 AM  Resp 22 02/11/2018 10:32 AM  SpO2 92 % 02/11/2018 10:32 AM  Vitals shown include unvalidated device data.  Last Pain:  Vitals:   02/11/18 0607  TempSrc:   PainSc: 8       Patients Stated Pain Goal: 4 (45/85/92 9244)  Complications: No apparent anesthesia complications

## 2018-02-11 NOTE — H&P (Signed)
PREOPERATIVE H&P  Chief Complaint: Left leg pain  HPI: Autumn Valenzuela is a 58 y.o. female who presents with ongoing pain in the left leg  MRI reveals NF stenosis at L4/5 with a spondylolisthesis  Patient has failed multiple forms of conservative care and continues to have pain (see office notes for additional details regarding the patient's full course of treatment)  Past Medical History:  Diagnosis Date  . Back pain   . Bipolar disorder ([place])   . CVA (cerebral infarction)   . Depression   . Diabetes mellitus without complication ([employer])   . High cholesterol   . Hypertension   . Left leg pain   . Migraine    Past Surgical History:  Procedure Laterality Date  . BACK SURGERY     Lumbar fusion L5-S1  . DIABETES    . EYE SURGERY Bilateral   . KNEE SURGERY    . NEUROFORAMINAL STENOSIS Left    Severe L4-L5, involving the level above previous fusion.  Marland Kitchen PARTIAL HYSTERECTOMY    . RADICULOPATHY Left    L4, Secondary to an L4-L5 Spondylolisthesis  . SHOULDER SURGERY     Social History   Socioeconomic History  . Marital status: Single    Spouse name: Not on file  . Number of children: 3  . Years of education: 65  . Highest education level: Not on file  Occupational History  . Not on file  Social Needs  . Financial resource strain: Not on file  . Food insecurity:    Worry: Not on file    Inability: Not on file  . Transportation needs:    Medical: Not on file    Non-medical: Not on file  Tobacco Use  . Smoking status: Former Smoker    Last attempt to quit: 12/13/2015    Years since quitting: 2.1  . Smokeless tobacco: Never Used  Substance and Sexual Activity  . Alcohol use: No  . Drug use: No    Types: Marijuana    Comment: occ - states stopped smokin weed 04/2014  . Sexual activity: Yes    Birth control/protection: None  Lifestyle  . Physical activity:    Days per week: Not on file    Minutes per session: Not on file  . Stress: Not on file    Relationships  . Social connections:    Talks on phone: Not on file    Gets together: Not on file    Attends religious service: Not on file    Active member of club or organization: Not on file    Attends meetings of clubs or organizations: Not on file    Relationship status: Not on file  Other Topics Concern  . Not on file  Social History Narrative   Patient is single with 3 children.   Patient is right handed.   Patient has a high school education.   Patient drinks 3 cups daily.   Family History  Problem Relation Age of Onset  . Diabetes Mother   . Stroke Father   . Cancer Paternal Aunt   . Cancer Paternal Aunt   . Diabetes Brother    Allergies  Allergen Reactions  . Cefazolin Swelling and Other (See Comments)    Rapid iv infusion caused swelling and burning at the iv site   Prior to Admission medications   Medication Sig Start Date End Date Taking? Authorizing Provider  albuterol (PROVENTIL HFA;VENTOLIN HFA) 108 (90 Base) MCG/ACT inhaler Inhale 2  puffs into the lungs daily as needed for wheezing or shortness of breath. 10/30/16  Yes Elby Beck, FNP  albuterol (PROVENTIL) (2.5 MG/3ML) 0.083% nebulizer solution Take 2.5 mg by nebulization at bedtime as needed for wheezing.   Yes [provider]  ALPRAZolam Duanne Moron) 0.5 MG tablet Take 1 tablet (0.5 mg total) by mouth daily as needed for anxiety. 05/19/17  Yes Elby Beck, FNP  amitriptyline (ELAVIL) 25 MG tablet Take 1 tablet (25 mg total) by mouth at bedtime. 11/10/17  Yes Elby Beck, FNP  calcium-vitamin D (OSCAL WITH D) 500-200 MG-UNIT per tablet Take 1 tablet by mouth daily with breakfast.   Yes [provider]  DULoxetine (CYMBALTA) 60 MG capsule Take 1 capsule (60 mg total) by mouth at bedtime. 10/13/17  Yes Elby Beck, FNP  fluticasone (FLONASE) 50 MCG/ACT nasal spray Place 2 sprays into both nostrils daily. Patient taking differently: Place 2 sprays into both nostrils daily  as needed for allergies.  03/31/16  Yes Julianne Rice, MD  Insulin Glargine (LANTUS) 100 UNIT/ML Solostar Pen Inject 10 Units into the skin daily. If blood sugar over 200, increase by 2 units up to 40 units. Patient taking differently: Inject 8-10 Units into the skin at bedtime. If blood sugar over 200, increase by 2 units up to 40 units. 01/15/18  Yes Elby Beck, FNP  ketorolac (ACULAR) 0.4 % SOLN Place 1 drop into both eyes daily as needed (eye irritation).  04/08/16  Yes [provider]  loratadine (CLARITIN) 10 MG tablet Take 1 tablet (10 mg total) by mouth daily. Patient taking differently: Take 10 mg by mouth daily as needed for allergies.  03/31/16  Yes Julianne Rice, MD  metFORMIN (GLUCOPHAGE) 1000 MG tablet Take 1 tablet (1,000 mg total) by mouth 2 (two) times daily with a meal. 12/17/17  Yes Elby Beck, FNP  methocarbamol (ROBAXIN) 500 MG tablet TAKE 1 TABLET(500 MG) BY MOUTH EVERY 12 HOURS AS NEEDED FOR MUSCLE SPASMS Patient taking differently: Take 500 mg by mouth 2 (two) times daily as needed for muscle spasms.  01/15/18  Yes Elby Beck, FNP  naproxen (NAPROSYN) 500 MG tablet TAKE 1 TABLET(500 MG) BY MOUTH TWICE DAILY AS NEEDED 01/30/18  Yes Elby Beck, FNP  ondansetron (ZOFRAN-ODT) 8 MG disintegrating tablet Take 1 tablet (8 mg total) by mouth every 8 (eight) hours as needed for nausea. 10/15/17  Yes Elby Beck, FNP  topiramate (TOPAMAX) 100 MG tablet Take 1 tablet (100 mg total) by mouth 2 (two) times daily. 10/13/17  Yes Elby Beck, FNP  Vitamin D, Ergocalciferol, (DRISDOL) 50000 units CAPS capsule Take 1 capsule (50,000 Units total) by mouth every 7 (seven) days. 08/25/17  Yes Elby Beck, FNP  ACCU-CHEK AVIVA PLUS test strip 1 each by Other route as needed for other. Use as instructed 01/30/18   Elby Beck, FNP  ACCU-CHEK SOFTCLIX LANCETS lancets USE UP TO QID UTD 10/30/16   Elby Beck, FNP  blood glucose meter  kit and supplies Dispense based on patient and insurance preference. Use up to four times daily as directed. (FOR ICD-9 250.00, 250.01). 03/26/16   Eulogio Bear U, DO  Insulin Pen Needle 31G X 5 MM MISC 1 Units by Does not apply route at bedtime. 01/15/18   Elby Beck, FNP  triamcinolone ointment (KENALOG) 0.1 % Apply topically 3 (three) times daily. Patient taking differently: Apply topically daily as needed (irritation).  03/26/16  Geradine Girt, DO     All other systems have been reviewed and were otherwise negative with the exception of those mentioned in the HPI and as above.  Physical Exam: Vitals:   02/11/18 0544  BP: 108/70  Pulse: 84  Resp: 18  Temp: 98.3 F (36.8 C)  SpO2: 100%    Body mass index is 41.95 kg/m.  General: Alert, no acute distress Cardiovascular: No pedal edema Respiratory: No cyanosis, no use of accessory musculature Skin: No lesions in the area of chief complaint Neurologic: Sensation intact distally Psychiatric: Patient is competent for consent with normal mood and affect Lymphatic: No axillary or cervical lymphadenopathy  MUSCULOSKELETAL: + SLR on the left  Assessment/Plan: ONGOING SEVERE LEFT L4 RADICULOPATHYT, SECONDARY TO AN L4-L5 SPONDYLOLISTHESES AND SEVERE LEFT SIDED L4-L5 NEUROFORAMINAL STENOSIS Plan for Procedure(s): LEFT LATERAL LUMBAR  4-5 INTERBODY FUSION WITH INSTRUMENTATION AND ALLOGRAFT   Norva Karvonen, MD 02/11/2018 6:43 AM

## 2018-02-11 NOTE — Progress Notes (Signed)
Inpatient Diabetes Program Recommendations  AACE/ADA: New Consensus Statement on Inpatient Glycemic Control (2015)  Target Ranges:  Prepandial:   less than 140 mg/dL      Peak postprandial:   less than 180 mg/dL (1-2 hours)      Critically ill patients:  140 - 180 mg/dL   Lab Results  Component Value Date   GLUCAP 207 (H) 02/11/2018   HGBA1C 10.4 (A) 12/17/2017    Review of Glycemic Control Results for CINDY, FULLMAN (MRN [id number]) as of 02/11/2018 11:08  Ref. Range 02/09/2018 07:50 02/11/2018 05:42 02/11/2018 10:18  Glucose-Capillary Latest Ref Range: 70 - 99 mg/dL 140 (H) 130 (H) 207 (H)   Diabetes history: DM 2 Outpatient Diabetes medications:  Lantus 10 units daily (started 01/15/18)  Inpatient Diabetes Program Recommendations:    Continue home dose of Lantus 10 units q HS.  Also please add Novolog moderate correction q 4 hours to keep blood sugars < 180 mg/dL while in the hospital.  Will talk with patient regarding A1C when appropriate.   Thanks  Adah Perl, RN, BC-ADM Inpatient Diabetes Coordinator Pager [phone] (8a-5p)

## 2018-02-11 NOTE — Anesthesia Preprocedure Evaluation (Addendum)
Anesthesia Evaluation  Patient identified by MRN, date of birth, ID band Patient awake    Reviewed: Allergy & Precautions, NPO status , Patient's Chart, lab work & pertinent test results  Airway Mallampati: III  TM Distance: >3 FB Neck ROM: Full  Mouth opening: Limited Mouth Opening  Dental no notable dental hx. 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)    GI/Hepatic negative GI ROS, Neg liver ROS,   Endo/Other  diabetes, Type 2, Insulin Dependent, Oral Hypoglycemic AgentsMorbid obesity  Renal/GU negative Renal ROS  negative genitourinary   Musculoskeletal negative musculoskeletal ROS (+)   Abdominal   Peds  Hematology negative hematology ROS (+)   Anesthesia Other Findings   Reproductive/Obstetrics                            Anesthesia Physical Anesthesia Plan  ASA: III  Anesthesia Plan: General   Post-op Pain Management:    Induction: Intravenous  PONV Risk Score and Plan: 3 and Midazolam, Dexamethasone and Ondansetron  Airway Management Planned: Oral ETT  Additional Equipment:   Intra-op Plan:   Post-operative Plan: Extubation in OR  Informed Consent: I have reviewed the patients History and Physical, chart, labs and discussed the procedure including the risks, benefits and alternatives for the proposed anesthesia with the patient or authorized representative who has indicated his/her understanding and acceptance.     Dental advisory given  Plan Discussed with: CRNA, Anesthesiologist and Surgeon  Anesthesia Plan Comments:         Anesthesia Quick Evaluation

## 2018-02-11 NOTE — Op Note (Signed)
NAME: Autumn Valenzuela  MEDICAL RECORD NO: [id number]  DATE OF BIRTH: [birth year]  DATE OF PROCEDURE:  02/09/2018  PHYSICIAN:Garyson Stelly Velna Ochs, MD        OPERATIVE REPORT   PREOPERATIVE DIAGNOSES: 1.  Left-sided L4 radiculopathy. 2.  Severe L4-5 spinal stenosis.   3. L4/5 spondylolisthesis 4.  Status post previous L5/S1 fusion many years ago  POSTOPERATIVE DIAGNOSES: 1.  Left-sided L4 radiculopathy. 2.  Severe L4-5 spinal stenosis.   3. L4/5 spondylolisthesis 4.  Status post previous L5/S1 fusion many years ago  PROCEDURE:  (Stage 1 of 2): 1.  Left-sided lateral interbody fusion via direct lateral retroperitoneal approach. 2.  Insertion of interbody device x1 (14 x 18 mm x 55 mm, lordotic NuVasive intervertebral spacer). 3.  Use of morselized allograft -- ViviGen.   4.  Intraoperative use of fluoroscopy.  SURGEON:  Phylliss Bob, MD  ASSISTANT:  Pricilla Holm PA-C.  ANESTHESIA:  General endotracheal anesthesia.  COMPLICATIONS:  None.  DISPOSITION:  Stable.  ESTIMATED BLOOD LOSS:  Minimal.  INDICATIONS:  Briefly, the patient is a very pleasant 58 year old female who did present to me with ongoing pain in the left leg.  She did have significant nerve compression at L4/5 and also had a spondylolisthesis. Given the patient's ongoing symptoms, and lack of improvement with appropriate conservative treatment measures, I did discuss proceeding with a 2-stage procedure, starting with a direct lateral interbody fusion as reflected above.  The patient was fully aware of the risks and limitations of surgery, and did wish to proceed.  DESCRIPTION OF PROCEDURE:  On 1/2292020 the patient was brought to surgery and general endotracheal anesthesia was administered.  The patient was placed in the lateral decubitus position, with the left side up.  Neurologic monitoring leads were placed by the monitoring technician.  The patient's torso and lower extremities were secured to the bed.  The patient's  hips and knees were flexed in order to lessen the tension on the psoas musculature.  The left flank was then prepped and draped in the usual sterile fashion.  The bed was flexed, in order to optimize exposure to the L4-5 intervertebral space.  After a timeout procedure was performed, a left-sided transverse incision was made over the left flank overlying the L4-5 intervertebral space.  The superior aspect of the iliac crest was in the region of the L4-5 intervertebral space.  I did need to dissect just above the iliac crest, and through the external and internal oblique musculature, and transversalis musculature and fascia.  The retroperitoneal space was encountered.  The peritoneum was bluntly swept anteriorly, and the psoas was readily identified.  I did use a series of dilators to dock over the L4-5 intervertebral space.  I did use neurologic monitoring while placing the dilators, in order to ensure that there were no neurologic structures in the immediate vicinity of the dilators.  The lumbar plexus was noted to be posterior.  A self-retaining retractor was placed, and was attached to a rigid arm.  The retractor was very gently dilated and a shim was placed into the L4/5 disc space.  I then used a knife to perform an annulotomy at the lateral aspect of the L4-5 intervertebral space.  I then used a series of curettes and pituitary rongeurs in  order to perform a thorough and complete L4-5 intervertebral diskectomy.  The contralateral annulus was released.  I then placed a series of intervertebral spacer trials, and I did feel that a 14 x 18 mm x  55 mm spacer would be the most appropriate fit. The appropriate spacer was then packed with ViviGen and tamped into position.  I was very pleased with the final resting position of the intervertebral spacer. I was very pleased with the final AP and lateral fluoroscopic images and the excellent restoration of disk height identified on both AP and lateral images.  Of note,  I did use neurologic monitoring throughout the entire surgery, and there was no sustained EMG activity noted throughout the entire surgery. At this point, the wound was copiously irrigated.  The fascia, internal, and external oblique musculature was closed using #1 Vicryl.  The subcutaneous layer was closed using 2-0 Vicryl and the skin was closed using 4-0 Monocryl.  Benzoin and Steri-Strips were applied followed by sterile dressing.  All instrument counts were correct at the termination of the procedure.  Of note, Pricilla Holm was my assistant throughout surgery, and did aid in retraction, suctioning, and closure.

## 2018-02-11 NOTE — Anesthesia Procedure Notes (Signed)
Procedure Name: Intubation Date/Time: 02/11/2018 7:43 AM Performed by: Kyung Rudd, CRNA Pre-anesthesia Checklist: Patient identified, Emergency Drugs available, Suction available and Patient being monitored Patient Re-evaluated:Patient Re-evaluated prior to induction Oxygen Delivery Method: Circle system utilized Preoxygenation: Pre-oxygenation with 100% oxygen Induction Type: IV induction Ventilation: Mask ventilation without difficulty Laryngoscope Size: Glidescope and 4 Tube type: Oral Tube size: 7.0 mm Number of attempts: 1 Airway Equipment and Method: Video-laryngoscopy and Stylet Placement Confirmation: ETT inserted through vocal cords under direct vision,  positive ETCO2 and breath sounds checked- equal and bilateral Secured at: 21 cm Tube secured with: Tape Dental Injury: Teeth and Oropharynx as per pre-operative assessment

## 2018-02-11 NOTE — Progress Notes (Signed)
Orthopedic Tech Progress Note Patient Details:  Autumn Valenzuela 27-Jan-1960 [id number] Patient already has a Brace Patient ID: Autumn Valenzuela, female   DOB: 07/25/1960, 58 y.o.   MRN: [id number]   Rc Amison Durwin Nora 02/11/2018, 1:32 PM

## 2018-02-11 NOTE — Addendum Note (Signed)
Addendum  created 02/11/18 1130 by Kyung Rudd, CRNA   Intraprocedure Meds edited

## 2018-02-12 ENCOUNTER — Inpatient Hospital Stay (HOSPITAL_COMMUNITY): Admission: RE | Admit: 2018-02-12 | Payer: Medicare PPO | Source: Ambulatory Visit | Admitting: Orthopedic Surgery

## 2018-02-12 ENCOUNTER — Encounter (HOSPITAL_COMMUNITY): Payer: Self-pay | Admitting: Certified Registered Nurse Anesthetist

## 2018-02-12 ENCOUNTER — Inpatient Hospital Stay (HOSPITAL_COMMUNITY)
Admission: RE | Disposition: A | Discharge status: Home / Self Care | Payer: Self-pay | Source: Home / Self Care | Attending: Orthopedic Surgery

## 2018-02-12 ENCOUNTER — Inpatient Hospital Stay (HOSPITAL_COMMUNITY): Payer: Medicare PPO

## 2018-02-12 ENCOUNTER — Inpatient Hospital Stay (HOSPITAL_COMMUNITY): Payer: Medicare PPO | Admitting: Anesthesiology

## 2018-02-12 LAB — GLUCOSE, CAPILLARY
Glucose-Capillary: 189 mg/dL — ABNORMAL HIGH (ref 70–99)
Glucose-Capillary: 199 mg/dL — ABNORMAL HIGH (ref 70–99)
Glucose-Capillary: 109 mg/dL — ABNORMAL HIGH (ref 70–99)

## 2018-02-12 SURGERY — POSTERIOR LUMBAR FUSION 1 LEVEL
Anesthesia: General | Site: Spine Lumbar | Laterality: Left

## 2018-02-12 MED ORDER — ONDANSETRON HCL 4 MG/2ML IJ SOLN
INTRAMUSCULAR | Status: DC | PRN
  Administered 2018-02-12: 4 mg via INTRAVENOUS

## 2018-02-12 MED ORDER — MIDAZOLAM HCL 5 MG/5ML IJ SOLN
INTRAMUSCULAR | Status: DC | PRN
  Administered 2018-02-12: 2 mg via INTRAVENOUS

## 2018-02-12 MED ORDER — FENTANYL CITRATE (PF) 250 MCG/5ML IJ SOLN
INTRAMUSCULAR | Status: AC
  Filled 2018-02-12: qty 5

## 2018-02-12 MED ORDER — LACTATED RINGERS IV SOLN
INTRAVENOUS | Status: DC | PRN
  Administered 2018-02-12 (×2): via INTRAVENOUS

## 2018-02-12 MED ORDER — METHOCARBAMOL 1000 MG/10ML IJ SOLN
500.0000 mg | Freq: Once | INTRAVENOUS | Status: DC
  Filled 2018-02-12 (×2): qty 5

## 2018-02-12 MED ORDER — PROPOFOL 10 MG/ML IV BOLUS
INTRAVENOUS | Status: AC
  Filled 2018-02-12: qty 20

## 2018-02-12 MED ORDER — PHENYLEPHRINE 40 MCG/ML (10ML) SYRINGE FOR IV PUSH (FOR BLOOD PRESSURE SUPPORT)
PREFILLED_SYRINGE | INTRAVENOUS | Status: AC
  Filled 2018-02-12: qty 20

## 2018-02-12 MED ORDER — PHENYLEPHRINE 40 MCG/ML (10ML) SYRINGE FOR IV PUSH (FOR BLOOD PRESSURE SUPPORT)
PREFILLED_SYRINGE | INTRAVENOUS | Status: DC | PRN
  Administered 2018-02-12: 80 ug via INTRAVENOUS
  Administered 2018-02-12 (×3): 160 ug via INTRAVENOUS

## 2018-02-12 MED ORDER — HYDROMORPHONE HCL 1 MG/ML IJ SOLN
INTRAMUSCULAR | Status: AC
  Filled 2018-02-12: qty 1

## 2018-02-12 MED ORDER — BUPIVACAINE LIPOSOME 1.3 % IJ SUSP
20.0000 mL | INTRAMUSCULAR | Status: AC
  Administered 2018-02-12: 20 mL
  Filled 2018-02-12: qty 20

## 2018-02-12 MED ORDER — BUPIVACAINE-EPINEPHRINE (PF) 0.25% -1:200000 IJ SOLN
INTRAMUSCULAR | Status: AC
  Filled 2018-02-12: qty 30

## 2018-02-12 MED ORDER — DEXAMETHASONE SODIUM PHOSPHATE 10 MG/ML IJ SOLN
INTRAMUSCULAR | Status: DC | PRN
  Administered 2018-02-12: 4 mg via INTRAVENOUS

## 2018-02-12 MED ORDER — VANCOMYCIN HCL 10 G IV SOLR
1500.0000 mg | INTRAVENOUS | Status: AC
  Administered 2018-02-12: 1500 mg via INTRAVENOUS
  Filled 2018-02-12: qty 1500

## 2018-02-12 MED ORDER — SUCCINYLCHOLINE CHLORIDE 200 MG/10ML IV SOSY
PREFILLED_SYRINGE | INTRAVENOUS | Status: AC
  Filled 2018-02-12: qty 10

## 2018-02-12 MED ORDER — BUPIVACAINE-EPINEPHRINE 0.25% -1:200000 IJ SOLN
INTRAMUSCULAR | Status: DC | PRN
  Administered 2018-02-12: 30 mL

## 2018-02-12 MED ORDER — SUCCINYLCHOLINE CHLORIDE 20 MG/ML IJ SOLN
INTRAMUSCULAR | Status: DC | PRN
  Administered 2018-02-12: 200 mg via INTRAVENOUS

## 2018-02-12 MED ORDER — SODIUM CHLORIDE 0.9 % IV SOLN
INTRAVENOUS | Status: DC | PRN
  Administered 2018-02-12: 40 ug/min via INTRAVENOUS

## 2018-02-12 MED ORDER — PROPOFOL 10 MG/ML IV BOLUS
INTRAVENOUS | Status: DC | PRN
  Administered 2018-02-12: 150 mg via INTRAVENOUS

## 2018-02-12 MED ORDER — FENTANYL CITRATE (PF) 100 MCG/2ML IJ SOLN
INTRAMUSCULAR | Status: DC | PRN
  Administered 2018-02-12 (×2): 50 ug via INTRAVENOUS
  Administered 2018-02-12 (×2): 100 ug via INTRAVENOUS
  Administered 2018-02-12 (×2): 50 ug via INTRAVENOUS

## 2018-02-12 MED ORDER — ONDANSETRON HCL 4 MG/2ML IJ SOLN
INTRAMUSCULAR | Status: AC
  Filled 2018-02-12: qty 2

## 2018-02-12 MED ORDER — LIDOCAINE 2% (20 MG/ML) 5 ML SYRINGE
INTRAMUSCULAR | Status: DC | PRN
  Administered 2018-02-12: 100 mg via INTRAVENOUS

## 2018-02-12 MED ORDER — 0.9 % SODIUM CHLORIDE (POUR BTL) OPTIME
TOPICAL | Status: DC | PRN
  Administered 2018-02-12: 2000 mL

## 2018-02-12 MED ORDER — PROPOFOL 500 MG/50ML IV EMUL
INTRAVENOUS | Status: DC | PRN
  Administered 2018-02-12: 75 ug/kg/min via INTRAVENOUS

## 2018-02-12 MED ORDER — THROMBIN 20000 UNITS EX SOLR
CUTANEOUS | Status: AC
  Filled 2018-02-12: qty 20000

## 2018-02-12 MED ORDER — THROMBIN 20000 UNITS EX SOLR
CUTANEOUS | Status: DC | PRN
  Administered 2018-02-12: 20000 [IU] via TOPICAL

## 2018-02-12 MED ORDER — METHYLENE BLUE 0.5 % INJ SOLN
INTRAVENOUS | Status: AC
  Filled 2018-02-12: qty 10

## 2018-02-12 MED ORDER — MIDAZOLAM HCL 2 MG/2ML IJ SOLN
INTRAMUSCULAR | Status: AC
  Filled 2018-02-12: qty 2

## 2018-02-12 MED ORDER — HYDROMORPHONE HCL 1 MG/ML IJ SOLN
0.2500 mg | INTRAMUSCULAR | Status: DC | PRN
  Administered 2018-02-12 (×5): 0.5 mg via INTRAVENOUS

## 2018-02-12 MED ORDER — HYDROMORPHONE HCL 1 MG/ML IJ SOLN: 0.5000 mg | INTRAMUSCULAR | Status: DC | PRN

## 2018-02-12 MED ORDER — ROCURONIUM BROMIDE 50 MG/5ML IV SOSY
PREFILLED_SYRINGE | INTRAVENOUS | Status: AC
  Filled 2018-02-12: qty 5

## 2018-02-12 MED ORDER — ACETAMINOPHEN 500 MG PO TABS
ORAL_TABLET | ORAL | Status: AC
  Administered 2018-02-12: 1000 mg via ORAL
  Filled 2018-02-12: qty 2

## 2018-02-12 MED ORDER — ACETAMINOPHEN 500 MG PO TABS
1000.0000 mg | ORAL_TABLET | Freq: Once | ORAL | Status: AC
  Administered 2018-02-12: 1000 mg via ORAL

## 2018-02-12 MED ORDER — LACTATED RINGERS IV SOLN
INTRAVENOUS | Status: DC | PRN
  Administered 2018-02-12: 09:00:00 via INTRAVENOUS

## 2018-02-12 MED FILL — Sodium Chloride IV Soln 0.9%: INTRAVENOUS | Qty: 1000 | Status: AC

## 2018-02-12 MED FILL — Heparin Sodium (Porcine) Inj 1000 Unit/ML: INTRAMUSCULAR | Qty: 30 | Status: AC

## 2018-02-12 SURGICAL SUPPLY — 86 items
BENZOIN TINCTURE PRP APPL 2/3 (GAUZE/BANDAGES/DRESSINGS) ×2 IMPLANT
BLADE CLIPPER SURG (BLADE) IMPLANT
BONE VIVIGEN FORMABLE 1.3CC (Bone Implant) ×4 IMPLANT
BUR PRESCISION 1.7 ELITE (BURR) ×2 IMPLANT
BUR ROUND FLUTED 5 RND (BURR) ×2 IMPLANT
BUR ROUND PRECISION 4.0 (BURR) IMPLANT
BUR SABER RD CUTTING 3.0 (BURR) IMPLANT
CARTRIDGE OIL MAESTRO DRILL (MISCELLANEOUS) ×1 IMPLANT
CONT SPEC 4OZ CLIKSEAL STRL BL (MISCELLANEOUS) ×2 IMPLANT
COVER MAYO STAND STRL (DRAPES) ×4 IMPLANT
COVER SURGICAL LIGHT HANDLE (MISCELLANEOUS) ×2 IMPLANT
COVER WAND RF STERILE (DRAPES) ×2 IMPLANT
DIFFUSER DRILL AIR PNEUMATIC (MISCELLANEOUS) ×2 IMPLANT
DRAIN CHANNEL 15F RND FF W/TCR (WOUND CARE) IMPLANT
DRAPE C-ARM 42X72 X-RAY (DRAPES) ×2 IMPLANT
DRAPE C-ARMOR (DRAPES) IMPLANT
DRAPE POUCH INSTRU U-SHP 10X18 (DRAPES) ×2 IMPLANT
DRAPE SURG 17X23 STRL (DRAPES) ×8 IMPLANT
DURAPREP 26ML APPLICATOR (WOUND CARE) ×2 IMPLANT
ELECT BLADE 4.0 EZ CLEAN MEGAD (MISCELLANEOUS) ×2
ELECT CAUTERY BLADE 6.4 (BLADE) ×2 IMPLANT
ELECT REM PT RETURN 9FT ADLT (ELECTROSURGICAL) ×2
ELECTRODE BLDE 4.0 EZ CLN MEGD (MISCELLANEOUS) ×1 IMPLANT
ELECTRODE REM PT RTRN 9FT ADLT (ELECTROSURGICAL) ×1 IMPLANT
EVACUATOR SILICONE 100CC (DRAIN) IMPLANT
FEE INTRAOP MONITOR IMPULS NCS (MISCELLANEOUS) ×1 IMPLANT
FILTER STRAW FLUID ASPIR (MISCELLANEOUS) ×2 IMPLANT
GAUZE 4X4 16PLY RFD (DISPOSABLE) ×2 IMPLANT
GAUZE SPONGE 4X4 12PLY STRL (GAUZE/BANDAGES/DRESSINGS) ×2 IMPLANT
GLOVE BIO SURGEON STRL SZ7 (GLOVE) ×2 IMPLANT
GLOVE BIO SURGEON STRL SZ8 (GLOVE) ×2 IMPLANT
GLOVE BIOGEL PI IND STRL 7.0 (GLOVE) ×1 IMPLANT
GLOVE BIOGEL PI IND STRL 8 (GLOVE) ×1 IMPLANT
GLOVE BIOGEL PI INDICATOR 7.0 (GLOVE) ×1
GLOVE BIOGEL PI INDICATOR 8 (GLOVE) ×1
GOWN STRL REUS W/ TWL LRG LVL3 (GOWN DISPOSABLE) ×2 IMPLANT
GOWN STRL REUS W/ TWL XL LVL3 (GOWN DISPOSABLE) ×1 IMPLANT
GOWN STRL REUS W/TWL LRG LVL3 (GOWN DISPOSABLE) ×2
GOWN STRL REUS W/TWL XL LVL3 (GOWN DISPOSABLE) ×1
GUIDEWIRE SHARP VIPER II (WIRE) ×4 IMPLANT
INTRAOP MONITOR FEE IMPULS NCS (MISCELLANEOUS) ×1
INTRAOP MONITOR FEE IMPULSE (MISCELLANEOUS) ×1
IV CATH 14GX2 1/4 (CATHETERS) ×2 IMPLANT
KIT ALARA NEURO ACCESS (KITS) ×4 IMPLANT
KIT BASIN OR (CUSTOM PROCEDURE TRAY) ×2 IMPLANT
KIT POSITION SURG JACKSON T1 (MISCELLANEOUS) ×2 IMPLANT
KIT TURNOVER KIT B (KITS) ×2 IMPLANT
MARKER SKIN DUAL TIP RULER LAB (MISCELLANEOUS) ×4 IMPLANT
NDL SAFETY ECLIPSE 18X1.5 (NEEDLE) ×1 IMPLANT
NEEDLE 22X1 1/2 (OR ONLY) (NEEDLE) ×4 IMPLANT
NEEDLE HYPO 18GX1.5 SHARP (NEEDLE) ×1
NEEDLE HYPO 25GX1X1/2 BEV (NEEDLE) ×2 IMPLANT
NEEDLE SPNL 18GX3.5 QUINCKE PK (NEEDLE) ×4 IMPLANT
NS IRRIG 1000ML POUR BTL (IV SOLUTION) ×2 IMPLANT
OIL CARTRIDGE MAESTRO DRILL (MISCELLANEOUS) ×2
PACK LAMINECTOMY ORTHO (CUSTOM PROCEDURE TRAY) ×2 IMPLANT
PACK UNIVERSAL I (CUSTOM PROCEDURE TRAY) ×2 IMPLANT
PAD ARMBOARD 7.5X6 YLW CONV (MISCELLANEOUS) ×4 IMPLANT
PATTIES SURGICAL .5 X1 (DISPOSABLE) ×2 IMPLANT
PATTIES SURGICAL .5X1.5 (GAUZE/BANDAGES/DRESSINGS) ×2 IMPLANT
PROBE PEDCLE PROBE MAGSTM DISP (MISCELLANEOUS) ×2 IMPLANT
ROD VIPER II LORDOSED 5.5X40 (Rod) ×2 IMPLANT
ROD VIPER2 5.5X45 PRE LARDOSED (Rod) ×2 IMPLANT
SCREW SET SINGLE INNER MIS (Screw) ×8 IMPLANT
SCREW XTAB POLY VIPER  7.5X40 (Screw) ×4 IMPLANT
SCREW XTAB POLY VIPER 7.5X40 (Screw) ×4 IMPLANT
SPONGE INTESTINAL PEANUT (DISPOSABLE) ×2 IMPLANT
SPONGE SURGIFOAM ABS GEL 100 (HEMOSTASIS) ×2 IMPLANT
STRIP CLOSURE SKIN 1/2X4 (GAUZE/BANDAGES/DRESSINGS) ×4 IMPLANT
SURGIFLO W/THROMBIN 8M KIT (HEMOSTASIS) IMPLANT
SUT MNCRL AB 4-0 PS2 18 (SUTURE) ×2 IMPLANT
SUT VIC AB 0 CT1 18XCR BRD 8 (SUTURE) ×1 IMPLANT
SUT VIC AB 0 CT1 8-18 (SUTURE) ×1
SUT VIC AB 1 CT1 18XCR BRD 8 (SUTURE) ×1 IMPLANT
SUT VIC AB 1 CT1 8-18 (SUTURE) ×1
SUT VIC AB 2-0 CT2 18 VCP726D (SUTURE) ×2 IMPLANT
SYR 20CC LL (SYRINGE) ×4 IMPLANT
SYR BULB IRRIGATION 50ML (SYRINGE) ×2 IMPLANT
SYR CONTROL 10ML LL (SYRINGE) ×4 IMPLANT
SYR TB 1ML LUER SLIP (SYRINGE) ×2 IMPLANT
TAP CANN VIPER2 8 DUEL LEAD (TAP) ×2 IMPLANT
TAP CANN VIPER2 DL 5.0 (TAP) ×2 IMPLANT
TAP CANN VIPER2 DL 6.0 (TAP) ×2 IMPLANT
TRAY FOLEY MTR SLVR 16FR STAT (SET/KITS/TRAYS/PACK) ×2 IMPLANT
WATER STERILE IRR 1000ML POUR (IV SOLUTION) ×2 IMPLANT
YANKAUER SUCT BULB TIP NO VENT (SUCTIONS) ×2 IMPLANT

## 2018-02-12 NOTE — Anesthesia Postprocedure Evaluation (Signed)
Anesthesia Post Note  Patient: Autumn Valenzuela  Procedure(s) Performed: LEFT LUMBAR 4-5 POSTERIOR SPINAL FUSION WITH INSTRUMENTATION AND ALLOGRAFT (Left Spine Lumbar)     Patient location during evaluation: PACU Anesthesia Type: General Level of consciousness: awake and alert Pain management: pain level controlled Vital Signs Assessment: post-procedure vital signs reviewed and stable Respiratory status: spontaneous breathing, nonlabored ventilation, respiratory function stable and patient connected to nasal cannula oxygen Cardiovascular status: blood pressure returned to baseline and stable Postop Assessment: no apparent nausea or vomiting Anesthetic complications: no    Last Vitals:  Vitals:   02/12/18 1239 02/12/18 1253  BP: 130/78 125/75  Pulse: 70 86  Resp: 15 (!) 21  Temp:    SpO2: 96% 90%    Last Pain:  Vitals:   02/12/18 1249  TempSrc:   PainSc: Asleep                 Tessa Seaberry L Eulas Schweitzer

## 2018-02-12 NOTE — H&P (Signed)
Patient tolerated stage 1 of her procedure well yesterday. Will proceed with a posterior fusion with revision instrumentation today.

## 2018-02-12 NOTE — Op Note (Signed)
PATIENT NAME: Autumn Valenzuela RECORD NO.:   [id number]    PHYSICIAN:  Phylliss Bob, MD      DATE OF BIRTH: 02-06-1960   DATE OF PROCEDURE: 02/12/2018                              OPERATIVE REPORT   PREOPERATIVE DIAGNOSES: 1.  S/p previous L4/5 lateral Lumbar fusion requiring posterior fusion and instrumentation 2.  S/p previous L5/S1 fusion in 2008  POSTOPERATIVE DIAGNOSES:   1.  S/p previous L4/5 lateral Lumbar fusion requiring posterior fusion and instrumentation 2.  S/p previous L5/S1 fusion in 2008   PROCEDURE (Stage 2 of 2): 1.  Removal of previously placed interconnecting rods and bilateral S1 pedicle screws 2.  Removal of previously placed L5 pedicle screws, with reinsertion of new 7.5 millimeters screws 3.  Placement of new segmental instrumentation L4, bilaterally 4.  L4-5 posterior spinal fusion 5.  Use of allograft - Vivigen 6.  Intraoperative use of fluoroscopy  SURGEON:  Phylliss Bob, MD  ASSISTANT:  Pricilla Holm, PA-C  ANESTHESIA:  General endotracheal anesthesia.  COMPLICATIONS:  None.  DISPOSITION:  Stable.  ESTIMATED BLOOD LOSS:  Minimal  INDICATIONS FOR SURGERY: Briefly, Ms. Horney is one day status post a lateral lumbar fusion at the L4/5 level.  Please refer to my operative report dated 02/11/2018, for a full account of the patient's preoperative history and indications for surgery.  The patient did present today for stage 2 of what was to be a 2-staged procedure.  OPERATIVE DETAILS:  On 02/12/2018, the patient was brought to surgery and general endotracheal anesthesia was administered.  The patient was placed prone onto a Jackson spinal bed.  The back was then prepped and draped in the usual sterile fashion.  I then made paramedian incisions on the right and left sides, just lateral to the lateral borders of the pedicles from L4-S1.  On the left side, the L4/5 posterolateral gutter and posterior elements were identified and  exposed and decorticated.  Vivigen was packed into the posterolateral gutter on the left side to aid in the success of the posterior fusion.   At this point, I did identify the previously placed pedicle screws at L5 and S1 bilaterally.  The caps and interconnecting rods were removed uneventfully.  At this point, the bilateral S1 pedicle screws were removed.  Bone wax was placed into the pedicle holes.  The L5 screws were then removed as well.  These screws were noted to be 6.5 mm in diameter.  I did replace the screws with 7.5 millimeters screws of the same length, 40 mm.  Then, liberally using AP and lateral fluoroscopy, I did cannulate the L4 pedicles.  I did tap up to a 6.5 mm tap, and 7.5 x 40 mm screws were placed bilaterally.  Of note, I did use neurologic monitoring and I did test each of the taps using triggered EMG.  There was no tap that tested below 12 milliamps.  Rods were then secured into the tulip heads of the screws bilaterally.  Caps were then placed and a final locking procedure was performed.  I was very pleased with the final AP and lateral fluoroscopic images.  The wound was then copiously irrigated.  On the right and left sides, the fascia was closed using #1 Vicryl.  The subcutaneous layer was closed using 0 Vicryl followed by 2-0 Vicryl, and the skin was then  closed using 4-0 Monocryl. Benzoin and Steri-Strips were applied followed by sterile dressing.  All instrument counts were correct at the termination of the procedure. Again, I did use neurologic monitoring throughout the surgery, and there was no abnormal EMG activity noted throughout the surgery.  Of note, Pricilla Holm was my assistant throughout surgery, and did aid in retraction, suctioning, and closure for both the anterior and posterior portions of the procedure.     Phylliss Bob, MD

## 2018-02-12 NOTE — Anesthesia Procedure Notes (Addendum)
Procedure Name: Intubation Date/Time: 02/12/2018 7:53 AM Performed by: Carney Living, CRNA Pre-anesthesia Checklist: Patient identified, Emergency Drugs available, Suction available, Patient being monitored and Timeout performed Patient Re-evaluated:Patient Re-evaluated prior to induction Oxygen Delivery Method: Circle system utilized Preoxygenation: Pre-oxygenation with 100% oxygen Induction Type: IV induction Ventilation: Mask ventilation without difficulty Laryngoscope Size: Mac and 4 Grade View: Grade I Tube type: Oral Tube size: 7.0 mm Number of attempts: 1 Airway Equipment and Method: Stylet Placement Confirmation: ETT inserted through vocal cords under direct vision,  positive ETCO2 and breath sounds checked- equal and bilateral Secured at: 21 cm Tube secured with: Tape Dental Injury: Teeth and Oropharynx as per pre-operative assessment

## 2018-02-12 NOTE — Progress Notes (Signed)
OT Cancellation Note  Patient Details Name: Autumn Valenzuela MRN: [id number] DOB: 08-24-1960   Cancelled Treatment:    Reason Eval/Treat Not Completed: Patient at procedure or test/ unavailable(in the OR for second part of back sx)  Jaci Carrel 02/12/2018, 8:15 AM   [place] Pager: [phone] Office: [phone]

## 2018-02-12 NOTE — Progress Notes (Signed)
PT Cancellation Note  Patient Details Name: Autumn Valenzuela MRN: [id number] DOB: 12-27-1960   Cancelled Treatment:    Reason Eval/Treat Not Completed: PT eval received, chart reviewed. Pt currently off unit for stage II of back surgery. Will continue to follow and initiate PT evaluation when able.    Thelma Comp 02/12/2018, 7:09 AM   Rolinda Roan, PT, DPT Acute Rehabilitation Services Pager: [phone] Office: [phone]

## 2018-02-12 NOTE — Transfer of Care (Signed)
Immediate Anesthesia Transfer of Care Note  Patient: Autumn Valenzuela  Procedure(s) Performed: LEFT LUMBAR 4-5 POSTERIOR SPINAL FUSION WITH INSTRUMENTATION AND ALLOGRAFT (Left Spine Lumbar)  Patient Location: PACU  Anesthesia Type:General  Level of Consciousness: awake, alert , oriented and patient cooperative  Airway & Oxygen Therapy: Patient Spontanous Breathing and Patient connected to nasal cannula oxygen  Post-op Assessment: Report given to RN, Post -op Vital signs reviewed and stable and Patient moving all extremities X 4  Post vital signs: Reviewed and stable  Last Vitals:  Vitals Value Taken Time  BP    Temp 36.4 C 02/12/2018 11:38 AM  Pulse 78 02/12/2018 11:40 AM  Resp 19 02/12/2018 11:40 AM  SpO2 99 % 02/12/2018 11:40 AM  Vitals shown include unvalidated device data.  Last Pain:  Vitals:   02/12/18 0424  TempSrc:   PainSc: 6       Patients Stated Pain Goal: 3 (00/37/04 8889)  Complications: No apparent anesthesia complications

## 2018-02-13 LAB — GLUCOSE, CAPILLARY
GLUCOSE-CAPILLARY: 134 mg/dL — AB (ref 70–99)
Glucose-Capillary: 80 mg/dL (ref 70–99)

## 2018-02-13 NOTE — Care Management Note (Signed)
Case Management Note  Patient Details  Name: Autumn Valenzuela MRN: [id number] Date of Birth: [birth year]  Subjective/Objective:  58 y.o. female admitted on 02/12/28 for elective L4/5 two stage fusion (1/29 and 02/12/18).  PTA, pt independent, lives with daughter and young grandchildren.                  Action/Plan: PT/OT recommending no OP follow up.  Pt has no DME needs.  Daughter able to assist with care at dc. No discharge needs identified.  Expected Discharge Date:  02/13/18               Expected Discharge Plan:  Home/Self Care  In-House Referral:     Discharge planning Services  CM Consult  Post Acute Care Choice:    Choice offered to:     DME Arranged:    DME Agency:     HH Arranged:    HH Agency:     Status of Service:  Completed, signed off  If discussed at [employer] of Stay Meetings, dates discussed:    Additional Comments:  Reinaldo Raddle, RN, BSN  Trauma/Neuro ICU Case Manager [phone]

## 2018-02-13 NOTE — Care Management Important Message (Signed)
Important Message  Patient Details  Name: Autumn Valenzuela MRN: [id number] Date of Birth: 1960-03-29   Medicare Important Message Given:  Yes    Orbie Pyo 02/13/2018, 2:32 PM

## 2018-02-13 NOTE — Progress Notes (Signed)
OT NOTE   02/13/18 1000  OT Visit Information  Last OT Received On 02/13/18  Assistance Needed +1  History of Present Illness 58 y.o. female admitted on 02/12/28 for elective L4/5 two stage fusion (1/29 and 02/12/18).  Pt with significant PMH of   Precautions  Precautions Back  Precaution Comments back handout   Home Living  Family/patient expects to be discharged to: Private residence  Living Arrangements Children (daughter lives in the home and 2 grandchildren (2 and 71mo))  Type of [place] to enter  Entrance Stairs-Number of Steps 3  Entrance Stairs-Rails Right  Home Layout One level  Bathroom Shower/Tub Tub/shower unit  Tax adviser - 2 wheels;Cane - single point;Wheelchair - power;Shower seat  Additional Comments constant migraines  Prior Function  Level of Independence Independent  Comments driving  Communication  Communication No difficulties  Pain Assessment  Pain Assessment 0-10  Pain Score 10  Pain Location back   Pain Descriptors / Indicators Operative site guarding  Cognition  Arousal/Alertness Awake/alert  Behavior During Therapy WFL for tasks assessed/performed  Overall Cognitive Status Within Functional Limits for tasks assessed  Upper Extremity Assessment  Upper Extremity Assessment Overall WFL for tasks assessed  Cervical / Trunk Assessment  Cervical / Trunk Assessment Other exceptions (s/p Surg)  ADL  Overall ADL's  Needs assistance/impaired  Eating/Feeding Modified independent  Grooming Modified independent;Standing  Upper Body Bathing Minimal assistance  Lower Body Bathing Moderate assistance  Upper Body Dressing  Minimal assistance  Lower Body Dressing Moderate assistance  Toilet Transfer Supervision/safety;RW;BSC  Functional mobility during ADLs Supervision/safety;Rolling walker  General ADL Comments pt at baseline has daughter (A) with LB dressing and bathing. pt has reacher at home and  has used for dressing previously  Vision- History  Baseline Vision/History Wears glasses  Wears Glasses Reading only  Bed Mobility  Overal bed mobility Modified Independent  General bed mobility comments requires use of bed rail but able to correct sequence bed.   Transfers  Overall transfer level Needs assistance  Equipment used Rolling walker (2 wheeled)  Transfers Sit to/from Stand  Sit to Stand Supervision  General transfer comment pt static standing to fit brace correctly in standing MOD I  General Comments  General comments (skin integrity, edema, etc.) dressing needs reinforcement and RN called to room to address concern. pt with dark room and reports hx of migraines that are limiting. pt tolerates natural light for session  OT - End of Session  Equipment Utilized During Treatment Gait belt;Rolling walker;Back brace  Activity Tolerance Patient tolerated treatment well  Patient left in chair;with call bell/phone within reach  Nurse Communication Mobility status;Precautions  OT Assessment  OT Recommendation/Assessment Patient needs continued OT Services  OT Visit Diagnosis Unsteadiness on feet (R26.81);Muscle weakness (generalized) (M62.81)  OT Problem List Decreased strength;Decreased activity tolerance;Impaired balance (sitting and/or standing);Decreased safety awareness;Decreased knowledge of use of DME or AE;Decreased knowledge of precautions;Obesity;Pain  OT Plan  OT Frequency (ACUTE ONLY) Min 2X/week  OT Treatment/Interventions (ACUTE ONLY) Self-care/ADL training;Therapeutic exercise;Neuromuscular education;Energy conservation;DME and/or AE instruction;Manual therapy;Modalities;Therapeutic activities;Patient/family education;Balance training  AM-PAC OT "6 Clicks" Daily Activity Outcome Measure (Version 2)  Help from another person eating meals? 4  Help from another person taking care of personal grooming? 4  Help from another person toileting, which includes using toliet,  bedpan, or urinal? 3  Help from another person bathing (including washing, rinsing, drying)? 2  Help from another person to put on and  taking off regular upper body clothing? 3  Help from another person to put on and taking off regular lower body clothing? 2  6 Click Score 18  OT Recommendation  Follow Up Recommendations No OT follow up  OT Equipment None recommended by OT  Individuals Consulted  Consulted and Agree with Results and Recommendations Patient  Acute Rehab OT Goals  Patient Stated Goal nothing stated but reports mother and daughter x2 can (A)   OT Goal Formulation With patient  Time For Goal Achievement 02/27/18  Potential to Achieve Goals Good  OT Time Calculation  OT Start Time (ACUTE ONLY) 1033  OT Stop Time (ACUTE ONLY) 1101  OT Time Calculation (min) 28 min  OT General Charges  $OT Visit 1 Visit  OT Evaluation  $OT Eval Moderate Complexity 1 Mod  OT Treatments  $Self Care/Home Management  8-22 mins  Written Expression  Dominant Hand Right    Jeri Modena, OTR/L  Acute Rehabilitation Services Pager: [phone] Office: [phone] .

## 2018-02-13 NOTE — Progress Notes (Signed)
Physical Therapy Evaluation Patient Details Name: Autumn Valenzuela MRN: [id number] DOB: 1960/03/12 Today's Date: 02/13/2018   History of Present Illness  58 y.o. female admitted on 02/12/28 for elective L4/5 two stage fusion (1/29 and 02/12/18).  Pt with significant PMH of HTN, DM, CVA, bipolar disorder, shoulder surgery, and knee surgery.  Clinical Impression  Pt did well with gait down the hallway, light use of RW, good posture.  She was able to demonstrate safety on stairs simulating home entry.  Back education handout provided and reviewed.  From a mobility standpoint she is safe to d/c home with family's assist.   PT to follow acutely for deficits listed below.      Follow Up Recommendations No PT follow up    Equipment Recommendations  None recommended by PT    Recommendations for Other Services   NA    Precautions / Restrictions Precautions Precautions: Back Precaution Comments: back handout       Mobility  Bed Mobility Overal bed mobility: Needs Assistance Bed Mobility: Sit to Sidelying         Sit to sidelying: Supervision General bed mobility comments: supervision for safety  Transfers Overall transfer level: Needs assistance Equipment used: Rolling walker (2 wheeled) Transfers: Sit to/from Stand Sit to Stand: Supervision         General transfer comment: supervision for safety  Ambulation/Gait Ambulation/Gait assistance: Supervision Gait Distance (Feet): 130 Feet Assistive device: Rolling walker (2 wheeled) Gait Pattern/deviations: Step-through pattern   Gait velocity interpretation: >2.62 ft/sec, indicative of community ambulatory General Gait Details: Pt with good speed, safe use of RW, upright posture during gait, light hands on RW for support/safety  Stairs Stairs: Yes Stairs assistance: Supervision Stair Management: One rail Left;Alternating pattern;Forwards Number of Stairs: 3 General stair comments: simulated home entry, slow, but  without difficulty         Balance Overall balance assessment: Needs assistance Sitting-balance support: Feet supported;No upper extremity supported Sitting balance-Leahy Scale: Good     Standing balance support: Bilateral upper extremity supported;No upper extremity supported Standing balance-Leahy Scale: Good                               Pertinent Vitals/Pain Pain Assessment: Faces Pain Score: 10-Worst pain ever Faces Pain Scale: Hurts even more Pain Location: back  Pain Descriptors / Indicators: Operative site guarding Pain Intervention(s): Limited activity within patient's tolerance;Monitored during session;Repositioned    Home Living Family/patient expects to be discharged to:: Private residence Living Arrangements: Alone Available Help at Discharge: Family Type of Home: House Home Access: Stairs to enter Entrance Stairs-Rails: Right Entrance Stairs-Number of Steps: 3 Home Layout: One level Home Equipment: Environmental consultant - 2 wheels;Cane - single point;Wheelchair - power;Shower seat Additional Comments: constant migraines    Prior Function Level of Independence: Independent         Comments: driving     Hand Dominance   Dominant Hand: Right    Extremity/Trunk Assessment   Upper Extremity Assessment Upper Extremity Assessment: Defer to OT evaluation    Lower Extremity Assessment Lower Extremity Assessment: Generalized weakness(but no reports of pain into her legs)    Cervical / Trunk Assessment Cervical / Trunk Assessment: Other exceptions Cervical / Trunk Exceptions: s/p two stage fusion  Communication   Communication: No difficulties  Cognition Arousal/Alertness: Awake/alert Behavior During Therapy: WFL for tasks assessed/performed Overall Cognitive Status: Within Functional Limits for tasks assessed  General Comments General comments (skin integrity, edema, etc.): dressing needs  reinforcement and RN called to room to address concern. pt with dark room and reports hx of migraines that are limiting. pt tolerates natural light for session        Assessment/Plan    PT Assessment Patient needs continued PT services  PT Problem List Decreased strength;Decreased activity tolerance;Decreased balance;Decreased mobility;Decreased knowledge of use of DME;Decreased knowledge of precautions;Pain       PT Treatment Interventions DME instruction;Gait training;Stair training;Functional mobility training;Therapeutic activities;Therapeutic exercise;Balance training;Patient/family education    PT Goals (Current goals can be found in the Care Plan section)  Acute Rehab PT Goals Patient Stated Goal: to go home today if she can PT Goal Formulation: With patient Time For Goal Achievement: 02/20/18 Potential to Achieve Goals: Good    Frequency Min 5X/week    AM-PAC PT "6 Clicks" Mobility  Outcome Measure Help needed turning from your back to your side while in a flat bed without using bedrails?: None Help needed moving from lying on your back to sitting on the side of a flat bed without using bedrails?: None Help needed moving to and from a bed to a chair (including a wheelchair)?: None Help needed standing up from a chair using your arms (e.g., wheelchair or bedside chair)?: None Help needed to walk in hospital room?: None Help needed climbing 3-5 steps with a railing? : A Little 6 Click Score: 23    End of Session Equipment Utilized During Treatment: Back brace Activity Tolerance: Patient limited by pain;Patient limited by fatigue Patient left: in bed;with call bell/phone within reach Nurse Communication: Mobility status PT Visit Diagnosis: Muscle weakness (generalized) (M62.81);Difficulty in walking, not elsewhere classified (R26.2);Pain Pain - Right/Left: (incisional) Pain - part of body: (back)    Time: 2229-7989 PT Time Calculation (min) (ACUTE ONLY): 10  min   Charges:        Wells Guiles B. Ryle Buscemi, PT, DPT  Acute Rehabilitation #[phone] pager #[phone] office   PT Evaluation $PT Eval Moderate Complexity: 1 Mod        02/13/2018, 1:31 PM

## 2018-02-13 NOTE — Progress Notes (Signed)
Discharge instructions given to pt. Pt verbalized understanding

## 2018-02-13 NOTE — Progress Notes (Signed)
Spoke with patient at the bedside. Was diagnosed about 1 year ago. Has been on Metformin. Patient states that she does take insulin at home. She takes Lantus by sliding scale, could not tell me how much. States that she is aware that her HgbA1C is elevated, but was trying to get it down for her surgery. Had been on insulin to help her blood sugars prior to surgery. Suggested that she follow up with her physician about her glucose control at home and check blood sugars at least twice per day.   Harvel Ricks RN BSN CDE Diabetes Coordinator Pager: [phone]  8am-5pm

## 2018-02-13 NOTE — Progress Notes (Signed)
    Patient doing well  Has not yet been OOB Patient denies leg pain or back pain   Physical Exam: Vitals:   02/13/18 0000 02/13/18 0400  BP: (!) 91/59 105/65  Pulse: 82 77  Resp: 18 18  Temp: 98.6 F (37 C) 98.2 F (36.8 C)  SpO2: 100% 100%    Dressing in place NVI  POD s/p L4/5 lateral/posterior fusion, doing well  - up with PT/OT, encourage ambulation - Percocet for pain, Valium for muscle spasms - d/c home today with f/u in 2 weeks

## 2018-02-18 NOTE — Discharge Summary (Addendum)
Patient ID: [name] MRN: [id number] DOB/AGE: August 25, 1960 58 y.o.  Admit date: 02/11/2018 Discharge date: 02/13/2018  Admission Diagnoses:  Active Problems:   Radiculopathy   Discharge Diagnoses:  Same  Past Medical History:  Diagnosis Date  . Back pain   . Bipolar disorder ([place])   . CVA (cerebral infarction)   . Depression   . Diabetes mellitus without complication ([place])   . High cholesterol   . Hypertension   . Left leg pain   . Migraine     Surgeries: Procedure(s): LEFT LUMBAR 4-5 POSTERIOR SPINAL FUSION WITH INSTRUMENTATION AND ALLOGRAFT on 02/12/2018   Consultants: None  Discharged Condition: Improved  Hospital Course: [name] is an 58 y.o. female who was admitted 02/11/2018 for operative treatment of radiculopathy. Patient has severe unremitting pain that affects sleep, daily activities, and work/hobbies. After pre-op clearance the patient was taken to the operating room on 02/12/2018 and underwent  Procedure(s): LEFT LUMBAR 4-5 POSTERIOR SPINAL FUSION WITH INSTRUMENTATION AND ALLOGRAFT.    Patient was given perioperative antibiotics:  Anti-infectives (From admission, onward)   Start     Dose/Rate Route Frequency Ordered Stop   02/12/18 0730  vancomycin (VANCOCIN) 1,500 mg in sodium chloride 0.9 % 500 mL IVPB     1,500 mg 250 mL/hr over 120 Minutes Intravenous To Surgery 02/12/18 0722 02/12/18 0927   02/11/18 1800  vancomycin (VANCOCIN) IVPB 1000 mg/200 mL premix     1,000 mg 200 mL/hr over 60 Minutes Intravenous  Once 02/11/18 1237 02/11/18 1800   02/11/18 0600  vancomycin (VANCOCIN) 1,500 mg in sodium chloride 0.9 % 500 mL IVPB     1,500 mg 250 mL/hr over 120 Minutes Intravenous To ShortStay Surgical 02/10/18 1346 02/11/18 0828       Patient was given sequential compression devices, early ambulation to prevent DVT.  Patient benefited maximally from hospital stay and there were no complications.    Recent vital signs: BP 104/62 (BP  Location: Left Arm)   Pulse 78   Temp 98.4 F (36.9 C) (Oral)   Resp 18   Ht '5\' 1"'  (1.549 m)   Wt 100.7 kg   SpO2 100%   BMI 41.95 kg/m    Discharge Medications:   Allergies as of 02/13/2018      Reactions   Cefazolin Swelling, Other (See Comments)   Rapid iv infusion caused swelling and burning at the iv site      Medication List    TAKE these medications   ACCU-CHEK AVIVA PLUS test strip Generic drug:  glucose blood 1 each by Other route as needed for other. Use as instructed   ACCU-CHEK SOFTCLIX LANCETS lancets USE UP TO QID UTD   albuterol 108 (90 Base) MCG/ACT inhaler Commonly known as:  PROVENTIL HFA;VENTOLIN HFA Inhale 2 puffs into the lungs daily as needed for wheezing or shortness of breath.   albuterol (2.5 MG/3ML) 0.083% nebulizer solution Commonly known as:  PROVENTIL Take 2.5 mg by nebulization at bedtime as needed for wheezing.   ALPRAZolam 0.5 MG tablet Commonly known as:  XANAX Take 1 tablet (0.5 mg total) by mouth daily as needed for anxiety.   amitriptyline 25 MG tablet Commonly known as:  ELAVIL Take 1 tablet (25 mg total) by mouth at bedtime.   blood glucose meter kit and supplies Dispense based on patient and insurance preference. Use up to four times daily as directed. (FOR ICD-9 250.00, 250.01).   calcium-vitamin D 500-200 MG-UNIT tablet Commonly known as:  OSCAL WITH D Take 1 tablet by mouth daily with breakfast.   DULoxetine 60 MG capsule Commonly known as:  CYMBALTA Take 1 capsule (60 mg total) by mouth at bedtime.   fluticasone 50 MCG/ACT nasal spray Commonly known as:  FLONASE Place 2 sprays into both nostrils daily. What changed:    when to take this  reasons to take this   Insulin Glargine 100 UNIT/ML Solostar Pen Commonly known as:  LANTUS Inject 10 Units into the skin daily. If blood sugar over 200, increase by 2 units up to 40 units. What changed:    how much to take  when to take this   Insulin Pen Needle 31G X  5 MM Misc 1 Units by Does not apply route at bedtime.   ketorolac 0.4 % Soln Commonly known as:  ACULAR Place 1 drop into both eyes daily as needed (eye irritation).   loratadine 10 MG tablet Commonly known as:  CLARITIN Take 1 tablet (10 mg total) by mouth daily. What changed:    when to take this  reasons to take this   metFORMIN 1000 MG tablet Commonly known as:  GLUCOPHAGE Take 1 tablet (1,000 mg total) by mouth 2 (two) times daily with a meal.   methocarbamol 500 MG tablet Commonly known as:  ROBAXIN TAKE 1 TABLET(500 MG) BY MOUTH EVERY 12 HOURS AS NEEDED FOR MUSCLE SPASMS What changed:  See the new instructions.   ondansetron 8 MG disintegrating tablet Commonly known as:  ZOFRAN-ODT Take 1 tablet (8 mg total) by mouth every 8 (eight) hours as needed for nausea.   topiramate 100 MG tablet Commonly known as:  TOPAMAX Take 1 tablet (100 mg total) by mouth 2 (two) times daily.   triamcinolone ointment 0.1 % Commonly known as:  KENALOG Apply topically 3 (three) times daily. What changed:    when to take this  reasons to take this   Vitamin D (Ergocalciferol) 1.25 MG (50000 UT) Caps capsule Commonly known as:  DRISDOL Take 1 capsule (50,000 Units total) by mouth every 7 (seven) days.       Diagnostic Studies: Dg Lumbar Spine 2-3 Views  Result Date: 02/12/2018 CLINICAL DATA:  Surgical posterior fusion of L4-5. EXAM: LUMBAR SPINE - 2-3 VIEW; DG C-ARM 61-120 MIN FLUOROSCOPY TIME:  1 minutes 50 seconds. COMPARISON:  Fluoroscopic images of February 11, 2018. FINDINGS: Interbody fusion of L4-5 and L5-S1 is again noted. There is been interval removal of intrapedicular screws of S1, with placement of new L4 intrapedicular screws. Good alignment of vertebral bodies is noted. IMPRESSION: Status post surgical posterior fusion of L4-5. Electronically Signed   By: Marijo Conception, M.D.   On: 02/12/2018 12:14   Dg Lumbar Spine 2-3 Views  Result Date: 02/11/2018 CLINICAL DATA:   L4-5 fusion EXAM: LUMBAR SPINE - 2-3 VIEW; DG C-ARM 61-120 MIN COMPARISON:  None. FLUOROSCOPY TIME:  Fluoroscopy Time:  2 minutes 5 seconds Radiation Exposure Index (if provided by the fluoroscopic device): Not available Number of Acquired Spot Images: 2 FINDINGS: Interbody fusion is noted at L5-S1 stable from the prior exam with pedicle screws and posterior fixation. Interval fusion at L4-5 has been performed. No acute abnormality noted. IMPRESSION: Interval L4-5 fusion. Electronically Signed   By: Inez Catalina M.D.   On: 02/11/2018 10:50   Dg C-arm 1-60 Min  Result Date: 02/12/2018 CLINICAL DATA:  Surgical posterior fusion of L4-5. EXAM: LUMBAR SPINE - 2-3 VIEW; DG C-ARM 61-120 MIN FLUOROSCOPY TIME:  1 minutes  50 seconds. COMPARISON:  Fluoroscopic images of February 11, 2018. FINDINGS: Interbody fusion of L4-5 and L5-S1 is again noted. There is been interval removal of intrapedicular screws of S1, with placement of new L4 intrapedicular screws. Good alignment of vertebral bodies is noted. IMPRESSION: Status post surgical posterior fusion of L4-5. Electronically Signed   By: Marijo Conception, M.D.   On: 02/12/2018 12:14   Dg C-arm 1-60 Min  Result Date: 02/11/2018 CLINICAL DATA:  L4-5 fusion EXAM: LUMBAR SPINE - 2-3 VIEW; DG C-ARM 61-120 MIN COMPARISON:  None. FLUOROSCOPY TIME:  Fluoroscopy Time:  2 minutes 5 seconds Radiation Exposure Index (if provided by the fluoroscopic device): Not available Number of Acquired Spot Images: 2 FINDINGS: Interbody fusion is noted at L5-S1 stable from the prior exam with pedicle screws and posterior fixation. Interval fusion at L4-5 has been performed. No acute abnormality noted. IMPRESSION: Interval L4-5 fusion. Electronically Signed   By: Inez Catalina M.D.   On: 02/11/2018 10:50    Disposition:    POD s/p L4/5 lateral/posterior fusion, doing well  - up with PT/OT, encourage ambulation - Percocet for pain, Valium for muscle spasms -Written scripts for pain signed  and in chart -D/C instructions sheet printed and in chart -D/C today  -F/U in office 2 weeks   Signed: Lennie Muckle Adaya Garmany 02/18/2018, 11:34 AM

## 2018-02-20 MED FILL — Sodium Chloride IV Soln 0.9%: INTRAVENOUS | Qty: 1000 | Status: AC

## 2018-02-20 MED FILL — Heparin Sodium (Porcine) Inj 1000 Unit/ML: INTRAMUSCULAR | Qty: 30 | Status: AC

## 2018-03-09 ENCOUNTER — Ambulatory Visit (INDEPENDENT_AMBULATORY_CARE_PROVIDER_SITE_OTHER): Payer: Medicare PPO | Admitting: Family Medicine

## 2018-03-09 ENCOUNTER — Encounter: Payer: Self-pay | Admitting: Family Medicine

## 2018-03-09 VITALS — BP 120/70 | HR 87 | Temp 97.3°F | Resp 20 | Ht 61.0 in | Wt 219.5 lb

## 2018-03-09 DIAGNOSIS — I10 Essential (primary) hypertension: Secondary | ICD-10-CM

## 2018-03-09 DIAGNOSIS — E1159 Type 2 diabetes mellitus with other circulatory complications: Secondary | ICD-10-CM | POA: Diagnosis not present

## 2018-03-09 DIAGNOSIS — Z794 Long term (current) use of insulin: Secondary | ICD-10-CM | POA: Diagnosis not present

## 2018-03-09 NOTE — Progress Notes (Signed)
Subjective:    Patient ID: Autumn Valenzuela, female    DOB: 1960/04/17, 58 y.o.   MRN: [id number]  HPI This is a 58 yo female, accompanied by her daughter and granddaughter, she recently had two part spinal surgery. She continues to wear upper body brace. She reports that her incision is healing well.  Has been doing well, finished PT yesterday, will continue to do home PT. Requiring 2-3 dose of pain medication daily.  Blood sugars 90-130s. Insulin- as needed for greater than 120 (this is different than prescribed), usually taking 6-8 units but not if blood sugar less than 120 which occasionally occurs.    Past Medical History:  Diagnosis Date  . Back pain   . Bipolar disorder ([place])   . CVA (cerebral infarction)   . Depression   . Diabetes mellitus without complication ([place])   . High cholesterol   . Hypertension   . Left leg pain   . Migraine    Past Surgical History:  Procedure Laterality Date  . ANTERIOR LATERAL LUMBAR FUSION WITH PERCUTANEOUS SCREW 1 LEVEL Left 02/11/2018   Procedure: LEFT LATERAL LUMBAR  FOUR-FIVE INTERBODY FUSION WITH INSTRUMENTATION AND ALLOGRAFT;  Surgeon: Phylliss Bob, MD;  Location: [facility];  Service: Orthopedics;  Laterality: Left;  LEFT LATERAL LUMBAR  FOUR-FIVE INTERBODY FUSION WITH INSTRUMENTATION AND ALLOGRAFT  . BACK SURGERY     Lumbar fusion L5-S1  . DIABETES    . EYE SURGERY Bilateral   . KNEE SURGERY    . NEUROFORAMINAL STENOSIS Left    Severe L4-L5, involving the level above previous fusion.  Marland Kitchen PARTIAL HYSTERECTOMY    . RADICULOPATHY Left    L4, Secondary to an L4-L5 Spondylolisthesis  . SHOULDER SURGERY     Family History  Problem Relation Age of Onset  . Diabetes Mother   . Stroke Father   . Cancer Paternal Aunt   . Cancer Paternal Aunt   . Diabetes Brother    Social History   Tobacco Use  . Smoking status: Former Smoker    Last attempt to quit: 12/13/2015    Years since quitting: 2.2  . Smokeless tobacco: Never Used    Substance Use Topics  . Alcohol use: No  . Drug use: No    Types: Marijuana    Comment: occ - states stopped smokin weed 04/2014      Review of Systems Per HPI    Objective:   Physical Exam Vitals signs reviewed.  Constitutional:      General: She is not in acute distress.    Appearance: Normal appearance. She is obese. She is not ill-appearing, toxic-appearing or diaphoretic.  HENT:     Head: Normocephalic and atraumatic.  Eyes:     Conjunctiva/sclera: Conjunctivae normal.  Cardiovascular:     Rate and Rhythm: Normal rate.  Pulmonary:     Effort: Pulmonary effort is normal.  Neurological:     Mental Status: She is alert and oriented to person, place, and time.  Psychiatric:        Mood and Affect: Mood normal.        Behavior: Behavior normal.        Thought Content: Thought content normal.        Judgment: Judgment normal.       BP 120/70 (BP Location: Right Arm, Patient Position: Sitting)   Pulse 87   Temp (!) 97.3 F (36.3 C) (Oral)   Resp 20   Ht 5\' 1"  (1.549 m)  Wt 219 lb 8 oz (99.6 kg)   SpO2 98%   BMI 41.47 kg/m  Wt Readings from Last 3 Encounters:  03/09/18 219 lb 8 oz (99.6 kg)  02/11/18 222 lb (100.7 kg)  02/09/18 222 lb 14.4 oz (101.1 kg)       Assessment & Plan:  1. Essential hypertension - well controlled, continue current meds  2. Type 2 diabetes mellitus with other circulatory complication, with long-term current use of insulin (HCC) - home glucose readings significantly improved, weight down a little, increased motivation to make healthy food choices and increase activity as tolerated - Too early to recheck hgba1c, follow up in 6 weeks   Clarene Reamer, FNP-BC  [facility], [place]  03/11/2018 8:37 AM

## 2018-03-09 NOTE — Patient Instructions (Signed)
Good to see you today  Keep up the good work!  Follow in 6 weeks!

## 2018-03-11 ENCOUNTER — Encounter: Payer: Self-pay | Admitting: Family Medicine

## 2018-04-20 ENCOUNTER — Encounter: Payer: Self-pay | Admitting: Family Medicine

## 2018-04-20 ENCOUNTER — Other Ambulatory Visit: Payer: Self-pay

## 2018-04-20 ENCOUNTER — Ambulatory Visit (INDEPENDENT_AMBULATORY_CARE_PROVIDER_SITE_OTHER): Payer: Medicare PPO | Admitting: Family Medicine

## 2018-04-20 VITALS — Ht 61.0 in

## 2018-04-20 DIAGNOSIS — M5410 Radiculopathy, site unspecified: Secondary | ICD-10-CM | POA: Diagnosis not present

## 2018-04-20 DIAGNOSIS — E1159 Type 2 diabetes mellitus with other circulatory complications: Secondary | ICD-10-CM

## 2018-04-20 DIAGNOSIS — J302 Other seasonal allergic rhinitis: Secondary | ICD-10-CM

## 2018-04-20 DIAGNOSIS — R21 Rash and other nonspecific skin eruption: Secondary | ICD-10-CM | POA: Diagnosis not present

## 2018-04-20 DIAGNOSIS — Z794 Long term (current) use of insulin: Secondary | ICD-10-CM

## 2018-04-20 MED ORDER — LEVOCETIRIZINE DIHYDROCHLORIDE 5 MG PO TABS: 5.0000 mg | ORAL_TABLET | Freq: Every evening | ORAL | 2 refills | Status: DC

## 2018-04-20 NOTE — Patient Instructions (Addendum)
Hi Obera,  Good to talk with you and see you for your virtual visit.   I just wanted to review our conversation.   Please schedule a lab only visit in 1-2 weeks for a drive through blood test to check your diabetes. I will contact you with the results.   Please see if your neurosurgeon can follow up with you sooner due to your pain and numbness and inability to sleep.   I have sent in a prescription for a different antihistamine for you to take instead of the Claritin (loratadine) it is called Xyzol and you will take it once a day in the evening. Continue the fluticasone nasal spray. If you go outside, try to cover your mouth. A cloth mask is fine to keep pollen out. I have also put some information about allergies below.   Try the A&D for your rash. Be sure to dry thoroughly before applying. If it gets worse, please let me know.   Take care and stay well,  Debbie  Allergic Rhinitis, Adult Allergic rhinitis is an allergic reaction that affects the mucous membrane inside the nose. It causes sneezing, a runny or stuffy nose, and the feeling of mucus going down the back of the throat (postnasal drip). Allergic rhinitis can be mild to severe. There are two types of allergic rhinitis:  Seasonal. This type is also called hay fever. It happens only during certain seasons.  Perennial. This type can happen at any time of the year. What are the causes? This condition happens when the body's defense system (immune system) responds to certain harmless substances called allergens as though they were germs.  Seasonal allergic rhinitis is triggered by pollen, which can come from grasses, trees, and weeds. Perennial allergic rhinitis may be caused by:  House dust mites.  Pet dander.  Mold spores. What are the signs or symptoms? Symptoms of this condition include:  Sneezing.  Runny or stuffy nose (nasal congestion).  Postnasal drip.  Itchy nose.  Tearing of the eyes.  Trouble  sleeping.  Daytime sleepiness. How is this diagnosed? This condition may be diagnosed based on:  Your medical history.  A physical exam.  Tests to check for related conditions, such as: ? Asthma. ? Pink eye. ? Ear infection. ? Upper respiratory infection.  Tests to find out which allergens trigger your symptoms. These may include skin or blood tests. How is this treated? There is no cure for this condition, but treatment can help control symptoms. Treatment may include:  Taking medicines that block allergy symptoms, such as antihistamines. Medicine may be given as a shot, nasal spray, or pill.  Avoiding the allergen.  Desensitization. This treatment involves getting ongoing shots until your body becomes less sensitive to the allergen. This treatment may be done if other treatments do not help.  If taking medicine and avoiding the allergen does not work, new, stronger medicines may be prescribed. Follow these instructions at home:  Find out what you are allergic to. Common allergens include smoke, dust, and pollen.  Avoid the things you are allergic to. These are some things you can do to help avoid allergens: ? Replace carpet with wood, tile, or vinyl flooring. Carpet can trap dander and dust. ? Do not smoke. Do not allow smoking in your home. ? Change your heating and air conditioning filter at least once a month. ? During allergy season:  Keep windows closed as much as possible.  Plan outdoor activities when pollen counts are lowest. This  is usually during the evening hours.  When coming indoors, change clothing and shower before sitting on furniture or bedding.  Take over-the-counter and prescription medicines only as told by your health care provider.  Keep all follow-up visits as told by your health care provider. This is important. Contact a health care provider if:  You have a fever.  You develop a persistent cough.  You make whistling sounds when you  breathe (you wheeze).  Your symptoms interfere with your normal daily activities. Get help right away if:  You have shortness of breath. Summary  This condition can be managed by taking medicines as directed and avoiding allergens.  Contact your health care provider if you develop a persistent cough or fever.  During allergy season, keep windows closed as much as possible. This information is not intended to replace advice given to you by your health care provider. Make sure you discuss any questions you have with your health care provider. Document Released: 09/25/2000 Document Revised: 02/08/2016 Document Reviewed: 02/08/2016 Elsevier Interactive Patient Education  2019 [employer].

## 2018-04-20 NOTE — Progress Notes (Addendum)
Virtual Visit via Video Note  I connected with Autumn Valenzuela on 04/20/18 at 11:00 AM EDT by a video enabled telemedicine application and verified that I am speaking with the correct person using two identifiers. We were able to complete the majority of our visit via video but had a bad connection at the end and used audio only.  The patient was in her home and I was in my office.    I discussed the limitations of evaluation and management by telemedicine and the availability of in person appointments. The patient expressed understanding and agreed to proceed.  History of Present Illness: This is a 58 year old female who agrees to a virtual visit for follow-up of chronic conditions.  She has history of diabetes mellitus type 2.  At last visit her insulin was restarted and at follow-up she reported improved blood sugars.  She reports they are running in the mid 100s now.  She does admit to increased stress.  She is currently taking loratadine and using fluticasone nasal spray.  She reports watery eyes, sneezing drippy nose.  She is getting outside a little but not excessively.  She had spinal surgery earlier this year and reports having increased pain.  She is not currently doing any exercises per her neurosurgeon.  She is intermittently wearing her back brace for comfort.  She has follow-up with neurosurgery in 2 weeks.  She continues to have left-sided numbness and some sharp shocking pain about 3 times a week.  This is been interfering with sleep she reports only sleeping about 4 hours during the day.  She has had increased headaches not sure if these are related to allergy symptoms.  In the last couple of days she has restarted her topiramate.  She also takes amitriptyline at bedtime for these.  She has had a couple of days of rash under her right arm.  She reports that it is itchy and she sees "whelps."  She also had one bump on her face but thought this was related to a bug bite.  She has  not applied anything to her right underarm.  She denies any erythema or small bumps..   Observations/Objective: Patient was alert and answers questions appropriately she was able to converse in complete sentences without shortness of breath.  Assessment and Plan: 1. Type 2 diabetes mellitus with other circulatory complication, with long-term current use of insulin (HCC) - home readings improved - due hgba1c, will have her come to drive through lab in next 1-2 weeks - Hemoglobin A1c; Future  2. Seasonal allergic rhinitis, unspecified trigger - continue fluticasone nasal spray - levocetirizine (XYZAL) 5 MG tablet; Take 1 tablet (5 mg total) by mouth every evening.  Dispense: 30 tablet; Refill: 2  3. Radiculopathy, unspecified spinal region - recommended she request earlier follow up with neurosurgery due to pain/numbness/insomnia  4. Rash - sounds allergic, not infectious, she will keep clean and dry, can apply barrier cream, she was instructed to let me know if no improvement or if worsening  - follow up on file and can follow up earlier if hgba1c not at goal  Clarene Reamer, FNP-BC  [facility], [place]  04/20/2018 11:36 AM   Follow Up Instructions:    I discussed the assessment and treatment plan with the patient. The patient was provided an opportunity to ask questions and all were answered. The patient agreed with the plan and demonstrated an understanding of the instructions.   The  patient was advised to call back or seek an in-person evaluation if the symptoms worsen or if the condition fails to improve as anticipated.  The amount of time spent on video/audo for virtual visit was 17 minutes.   Elby Beck, FNP

## 2018-05-02 ENCOUNTER — Other Ambulatory Visit: Payer: Self-pay | Admitting: Family Medicine

## 2018-05-27 ENCOUNTER — Other Ambulatory Visit: Payer: Self-pay | Admitting: Family Medicine

## 2018-05-27 DIAGNOSIS — G43709 Chronic migraine without aura, not intractable, without status migrainosus: Secondary | ICD-10-CM

## 2018-05-28 NOTE — Telephone Encounter (Signed)
Refill request for Amitriptyline. Last filled 11/10/2017 for 90 tablets with 1 refill. LOV 04/20/2018 and future appointment on 08/21/2018 for CPE

## 2018-06-18 ENCOUNTER — Ambulatory Visit (INDEPENDENT_AMBULATORY_CARE_PROVIDER_SITE_OTHER): Payer: Medicare PPO | Admitting: Family Medicine

## 2018-06-18 ENCOUNTER — Encounter: Payer: Self-pay | Admitting: Family Medicine

## 2018-06-18 ENCOUNTER — Telehealth: Payer: Self-pay

## 2018-06-18 VITALS — Temp 98.6°F | Wt 223.0 lb

## 2018-06-18 DIAGNOSIS — E1159 Type 2 diabetes mellitus with other circulatory complications: Secondary | ICD-10-CM

## 2018-06-18 DIAGNOSIS — Z794 Long term (current) use of insulin: Secondary | ICD-10-CM

## 2018-06-18 DIAGNOSIS — G8929 Other chronic pain: Secondary | ICD-10-CM | POA: Insufficient documentation

## 2018-06-18 DIAGNOSIS — M25561 Pain in right knee: Secondary | ICD-10-CM

## 2018-06-18 DIAGNOSIS — R739 Hyperglycemia, unspecified: Secondary | ICD-10-CM

## 2018-06-18 DIAGNOSIS — M25562 Pain in left knee: Secondary | ICD-10-CM

## 2018-06-18 NOTE — Telephone Encounter (Signed)
Pt called back;06/16/18 FBS 296 06/17/18 FBS not taken but 10 PM BS 318 06/18/18 FBS at 12:07 was 417. Pt took metformin 1000mg  this AM 06/18/18 FBS now is 333.  Pt feels sluggish. Pt has fallen in kitchen the other day. Pt taking PT due to surgery with rod in 5th lumbar. Pt is drinking more water. Pt was out of metformin for 2 days. Pt has not taken Lantus in 2 wks; one reason Lantus is too expensive but pt still has 2 lantus pens. Pt said she was not sure since taking metformin 1000 mg bid if she was supposed to take lantus also. 2 wks ago when pt was taking lantus pt was using sliding scale. If  BS was < 120 pt did not take lantus.  If BS was 121-150 pt took 2 units If BS was 151-200 pt took 3 units If BS 201-250 pt took 5 units If BS was 251-300 took 8 units If BS was 301-350 pt took 11 units If BS was 351 - 400 pt took 15 units.  Pt restarted taking metformin 1000 mg at supper on 06/17/18.  No fever,cough Pt has been having chills,S/T,SOB or tightness,muscle pain in lower extremities. Has blurred vision and has not lost sense of taste or smell but things do not smell or taste normal.

## 2018-06-18 NOTE — Progress Notes (Signed)
I connected with Autumn Valenzuela on 06/18/18 at  3:40 PM EDT by video and verified that I am speaking with the correct person using two identifiers.   I discussed the limitations, risks, security and privacy concerns of performing an evaluation and management service by video and the availability of in person appointments. I also discussed with the patient that there may be a patient responsible charge related to this service. The patient expressed understanding and agreed to proceed.  Patient location: Home Provider Location: [place] Participants: Lesleigh Noe and Jettie Booze and sister   Subjective:     Autumn Valenzuela is a 58 y.o. female presenting for Discuss elevated sugar levels and feeling fatigue (see telephone note in the chart)     HPI  #Elevated Blood sugars - has also noticed that her vision issues and leg pain issues - had a fall in the kitchen today - blurry vision which started with high CBG and has improved - still having headaches - took her BP medication  - the top of her knee feels weird currently (hx of knee replacement) - took medication and increased  - one day without metformin and few weeks since taking the insulin  #Falling - leg spasm - left side where she has known nerve damage -- started and traveled down the side of the leg to the her feet. Pain was getting worse with time - the cramping has improved and she can walk now - but feels unstable on her legs - fell in her kitchen, slid on water and hit her knee  - tried to get up and hit her opposite knee - knee pain - can be walking and suddenly her knees buckle - is requiring support to help keep her from falling - twisted her ankles recently as well  - is now using a cane to ambulate  Diabetes medication:  Metformin 1000 mg BID Glipizide 5 mg    06/18/2018: Phone call - elevated blood sugars in setting of not taking lantus and only recently restarting  metformin. Now feeling sluggish at home  Review of Systems  Constitutional: Positive for fatigue.  Eyes:       Blurry vision  Musculoskeletal: Positive for gait problem.     Social History   Tobacco Use  Smoking Status Former Smoker  . Last attempt to quit: 12/13/2015  . Years since quitting: 2.5  Smokeless Tobacco Never Used        Objective:   BP Readings from Last 3 Encounters:  03/09/18 120/70  02/13/18 104/62  02/09/18 (!) 142/92   Wt Readings from Last 3 Encounters:  06/18/18 223 lb (101.2 kg)  03/09/18 219 lb 8 oz (99.6 kg)  02/11/18 222 lb (100.7 kg)   Temp 98.6 F (37 C) Comment: per patient  Wt 223 lb (101.2 kg) Comment: per patient  BMI 42.14 kg/m    Physical Exam Constitutional:      Appearance: Normal appearance. She is not ill-appearing.  HENT:     Head: Normocephalic and atraumatic.     Right Ear: External ear normal.     Left Ear: External ear normal.  Eyes:     Conjunctiva/sclera: Conjunctivae normal.  Pulmonary:     Effort: Pulmonary effort is normal. No respiratory distress.  Musculoskeletal:     Comments: Ambulating w/o significant difficulty while holding phone  Neurological:     Mental Status: She is alert. Mental status is at baseline.  Psychiatric:  Mood and Affect: Mood normal.        Behavior: Behavior normal.        Thought Content: Thought content normal.        Judgment: Judgment normal.          Assessment & Plan:   Problem List Items Addressed This Visit      Endocrine   DM (diabetes mellitus) ([place]) - Primary    Pt with elevated CBG at home and some blurry vision in setting of not taking lantus. Advised restarting lantus and if concerned about low CBG could hold glipizide tomorrow, but continue metformin. Cost is a major factor for patient and daughter - including cost of strips and insulin. At this point the goal is to control CBG. Also advised if worsening or no improvement that may need to go the ER to be  evaluated for possible DKA or HHS and closer monitoring.       Relevant Medications   glipiZIDE (GLUCOTROL XL) 5 MG 24 hr tablet     Other   Hyperglycemia   Acute pain of both knees    Difficult to assess without exam. Buckling concerning for possible ligament or meniscus injury. But could also be arthritis. Recommend f/u appointment with in person exam. Pt got in car and then had poor signal so need to switch to video before I could look at knees. Advised having cane anytime she is walking for safety.         Daughter is POA and needs form to give her job a [employer] - will let PCP know to follow-up  Return in about 4 days (around 06/22/2018) for PCP follow-up.  Lesleigh Noe, MD

## 2018-06-18 NOTE — Assessment & Plan Note (Signed)
Pt with elevated CBG at home and some blurry vision in setting of not taking lantus. Advised restarting lantus and if concerned about low CBG could hold glipizide tomorrow, but continue metformin. Cost is a major factor for patient and daughter - including cost of strips and insulin. At this point the goal is to control CBG. Also advised if worsening or no improvement that may need to go the ER to be evaluated for possible DKA or HHS and closer monitoring.

## 2018-06-18 NOTE — Assessment & Plan Note (Addendum)
Difficult to assess without exam. Buckling concerning for possible ligament or meniscus injury. But could also be arthritis. Recommend f/u appointment with in person exam. Pt got in car and then had poor signal so need to switch to video before I could look at knees. Advised having cane anytime she is walking for safety.

## 2018-06-18 NOTE — Telephone Encounter (Signed)
Pt left v/m that her BS was elevated and she had fallen. I called pt back and pt was hollaring and crying when she answered the phone. Pt said she could not talk now because she could not walk; she said she was having cramps in feet. I offered to call someone for pt because she was by herself. Pt said no she was going to call her momma. Pt will cb after pain resolves and she can talk. [place] FNP.

## 2018-06-19 ENCOUNTER — Telehealth: Payer: Self-pay | Admitting: Family Medicine

## 2018-06-19 NOTE — Telephone Encounter (Signed)
Best number [phone] Pt called to give blood sugar readings  6/5  @ 7am    345 fasting

## 2018-06-19 NOTE — Telephone Encounter (Signed)
Patient seen by Dr. Einar Pheasant.

## 2018-06-22 ENCOUNTER — Other Ambulatory Visit: Payer: Self-pay

## 2018-06-22 ENCOUNTER — Ambulatory Visit (INDEPENDENT_AMBULATORY_CARE_PROVIDER_SITE_OTHER): Payer: Medicare PPO | Admitting: Family Medicine

## 2018-06-22 ENCOUNTER — Ambulatory Visit (INDEPENDENT_AMBULATORY_CARE_PROVIDER_SITE_OTHER)
Admission: RE | Admit: 2018-06-22 | Discharge: 2018-06-22 | Disposition: A | Discharge status: Home / Self Care | Payer: Medicare PPO | Source: Ambulatory Visit | Attending: Family Medicine | Admitting: Family Medicine

## 2018-06-22 VITALS — BP 110/72 | HR 87 | Temp 97.8°F | Ht 61.0 in | Wt 218.8 lb

## 2018-06-22 DIAGNOSIS — M545 Low back pain, unspecified: Secondary | ICD-10-CM

## 2018-06-22 DIAGNOSIS — Z794 Long term (current) use of insulin: Secondary | ICD-10-CM

## 2018-06-22 DIAGNOSIS — M25561 Pain in right knee: Secondary | ICD-10-CM

## 2018-06-22 DIAGNOSIS — M25562 Pain in left knee: Secondary | ICD-10-CM | POA: Diagnosis not present

## 2018-06-22 DIAGNOSIS — G8929 Other chronic pain: Secondary | ICD-10-CM

## 2018-06-22 DIAGNOSIS — F419 Anxiety disorder, unspecified: Secondary | ICD-10-CM

## 2018-06-22 DIAGNOSIS — W19XXXA Unspecified fall, initial encounter: Secondary | ICD-10-CM | POA: Diagnosis not present

## 2018-06-22 DIAGNOSIS — E1159 Type 2 diabetes mellitus with other circulatory complications: Secondary | ICD-10-CM

## 2018-06-22 LAB — POCT GLYCOSYLATED HEMOGLOBIN (HGB A1C): Hemoglobin A1C: 12.2 % — AB (ref 4.0–5.6)

## 2018-06-22 MED ORDER — ALPRAZOLAM 0.5 MG PO TABS: 0.5000 mg | ORAL_TABLET | Freq: Every day | ORAL | 2 refills | Status: DC | PRN

## 2018-06-22 MED ORDER — TRIAMCINOLONE ACETONIDE 0.1 % EX OINT: TOPICAL_OINTMENT | Freq: Two times a day (BID) | CUTANEOUS | 1 refills | Status: DC

## 2018-06-22 MED ORDER — TRIAMCINOLONE ACETONIDE 0.1 % EX OINT: TOPICAL_OINTMENT | Freq: Three times a day (TID) | CUTANEOUS | 0 refills | Status: DC

## 2018-06-22 NOTE — Progress Notes (Signed)
Subjective:    Patient ID: Autumn Valenzuela, female    DOB: [birth year], 58 y.o.   MRN: [id number]  HPI The patient, accompanied by her daughter, presents today with bilateral knee pain. She has fallen recently, slipped on water.  Has since been feeling like her legs are weak. Had back surgery earlier this year. Was doing well and continues with PT. Now, after fall, she is having numbness, tingling of both hips and legs. She is having significant mobility and balance issues. Separate issue seems to be bilateral knee pain, L>R. She previously saw Dr. Rhona Raider and according to the patient, her knees were fine. Her back issues then became the focus and she had surgery. She has swelling of right knee (previous TKR) and her knees feel weak and she hears popping. She is taking meloxicam with little relief of back, leg and knee pain. She is very frustrated as she feels she is regressing.   Elevated blood sugars- was restarted on Lantus 01/2018. Patient had virtual visit with Dr. Einar Pheasant 06/18/18 for elevated blood sugars. She had been off metformin and Lantus which was unaffordable for her.She restarted her metformin and glipizide and her glucometer shows readings in the 200s with 1 to low 400s.  She has [employer] and is unsure of her formulary and has not tried mail order. Her daughter does all of the food shopping. They eat mostly proteins and vegetables. The patient has been out of work since prior to her back surgery.     Past Medical History:  Diagnosis Date  . Back pain   . Bipolar disorder ([place])   . CVA (cerebral infarction)   . Depression   . Diabetes mellitus without complication ([place])   . High cholesterol   . Hypertension   . Left leg pain   . Migraine    Past Surgical History:  Procedure Laterality Date  . ANTERIOR LATERAL LUMBAR FUSION WITH PERCUTANEOUS SCREW 1 LEVEL Left 02/11/2018   Procedure: LEFT LATERAL LUMBAR  FOUR-FIVE INTERBODY FUSION WITH INSTRUMENTATION AND ALLOGRAFT;   Surgeon: Phylliss Bob, MD;  Location: [place];  Service: Orthopedics;  Laterality: Left;  LEFT LATERAL LUMBAR  FOUR-FIVE INTERBODY FUSION WITH INSTRUMENTATION AND ALLOGRAFT  . BACK SURGERY     Lumbar fusion L5-S1  . DIABETES    . EYE SURGERY Bilateral   . KNEE SURGERY    . NEUROFORAMINAL STENOSIS Left    Severe L4-L5, involving the level above previous fusion.  Marland Kitchen PARTIAL HYSTERECTOMY    . RADICULOPATHY Left    L4, Secondary to an L4-L5 Spondylolisthesis  . SHOULDER SURGERY     Family History  Problem Relation Age of Onset  . Diabetes Mother   . Stroke Father   . Cancer Paternal Aunt   . Cancer Paternal Aunt   . Diabetes Brother    Social History   Tobacco Use  . Smoking status: Former Smoker    Last attempt to quit: 12/13/2015    Years since quitting: 2.5  . Smokeless tobacco: Never Used  Substance Use Topics  . Alcohol use: No  . Drug use: No    Types: Marijuana    Comment: occ - states stopped smokin weed 04/2014      Review of Systems Per HPI    Objective:   Physical Exam Vitals signs reviewed.  Constitutional:      General: She is not in acute distress.    Appearance: Normal appearance. She is obese. She is not ill-appearing, toxic-appearing or  diaphoretic.  HENT:     Head: Normocephalic and atraumatic.  Cardiovascular:     Rate and Rhythm: Normal rate.  Pulmonary:     Effort: Pulmonary effort is normal.  Musculoskeletal:     Comments: Difficulty standing from chair. Using a cane for ambulation. Diffuse tenderness of low back. Decreased ROM. Decreased hip flexion, pain with straight leg raise. Patellar reflexes- trace bilaterally.  Left knee with mild generalized swelling. Bilateral knees with decreased extension. No effusion. No instability.   Skin:    General: Skin is warm and dry.  Neurological:     Mental Status: She is alert and oriented to person, place, and time.       BP 110/72   Pulse 87   Temp 97.8 F (36.6 C)   Ht '5\' 1"'  (1.549 m)   Wt  218 lb 12 oz (99.2 kg)   SpO2 96%   BMI 41.33 kg/m  Wt Readings from Last 3 Encounters:  06/22/18 218 lb 12 oz (99.2 kg)  06/18/18 223 lb (101.2 kg)  03/09/18 219 lb 8 oz (99.6 kg)   Results for orders placed or performed in visit on 06/22/18  POCT glycosylated hemoglobin (Hb A1C)  Result Value Ref Range   Hemoglobin A1C 12.2 (A) 4.0 - 5.6 %   HbA1c POC (<> result, manual entry)     HbA1c, POC (prediabetic range)     HbA1c, POC (controlled diabetic range)         Assessment & Plan:  1. Type 2 diabetes mellitus with other circulatory complication, with long-term current use of insulin (HCC) - elevated hgba1c today, discussed results with patient. I attempted to find her formulary and call her insurer to determine most cost effective long acting insulin but was unable to find her formulary on line or get through to an agent. I called her pharmacy and was told that her cost for Lantus would be $8.95. The patient said that would be with in her budget. She is resumed on metformin. Glipizide stopped. She was instructed on Lantus instructions.  - POCT glycosylated hemoglobin (Hb A1C) - blood glucose meter kit and supplies KIT; Dispense based on patient and insurance preference. Use up to four times daily as directed. (FOR ICD-9 250.00, 250.01).  Dispense: 1 each; Refill: 0  2. Fall, initial encounter - Given recent surgery, xray obtained to r/o hardware displacement - I will contact Dr. Lynann Bologna regarding patient and ask him to see her this week - DG Lumbar Spine Complete; Future  3. Lumbar back pain - see #2 - DG Lumbar Spine Complete; Future  4. Anxiety - ALPRAZolam (XANAX) 0.5 MG tablet; Take 1 tablet (0.5 mg total) by mouth daily as needed for anxiety.  Dispense: 30 tablet; Refill: 2  5. Chronic pain of both knees - Discussed options of additional evaluation with patient- she can see Dr. Lorelei Pont in my office or go back to ortho. She is understandably frustrated, but I am unable to  help her further.   I encouraged her daughter to sign up for Mychart for ease of communication and access to information. Patient's daughter requests letter about needing to help her mother attend appointments. I suggested she reach out to her HR department and inquire about FMLA and/or specific requirements for any documentation for her job.    Clarene Reamer, FNP-BC  [facility]  06/25/2018 8:16 AM

## 2018-06-22 NOTE — Patient Instructions (Signed)
Good to see you today  Your blood sugar is very high, I will call your pharmacy about your insulin choices and blood sugar meter/supplies

## 2018-06-22 NOTE — Telephone Encounter (Signed)
Noted. Patient with follow up appointment 06/22/18.

## 2018-06-24 ENCOUNTER — Other Ambulatory Visit: Payer: Self-pay | Admitting: Family Medicine

## 2018-06-24 DIAGNOSIS — E118 Type 2 diabetes mellitus with unspecified complications: Secondary | ICD-10-CM

## 2018-06-24 DIAGNOSIS — Z794 Long term (current) use of insulin: Secondary | ICD-10-CM

## 2018-06-24 MED ORDER — BLOOD GLUCOSE MONITOR KIT: PACK | 0 refills | Status: DC

## 2018-06-24 NOTE — Telephone Encounter (Signed)
Pt called and is asking what is the next step for her knees and the weakness in both knees. [phone] Pt is expecting a call back from Anastasiya and wants this addressed as well.

## 2018-06-24 NOTE — Telephone Encounter (Signed)
Spoke with patient and advised of below. Patient expressed that she is concerned about her back and weakness but her knees/legs bothering her also. She is in a lot of pain and she is tired of hurting all the time and not able to walk/run/go to the gym. She has hard time sleeping due to her pain. (patient started to cry for part of conversation) patient does not want to see specialists for her knees and wants her PCP to take care of her. She has seen orthopedic doctor before for her knees who said everything is fine "it is not fine," patient said. She can barely do anything due to her pain. She is frustrated that she keeps getting told her back/knees are fine when something is wrong. She just wants doctors to understand that. She is in so much pain. She wants Debbie to keep all of this in mind.  Patient understands her medications to take and to call Dr. Elmer Sow office if she does not hear anything from them by Friday 06/26/2018.

## 2018-06-24 NOTE — Telephone Encounter (Signed)
Please call patient and tell her that my number one priority is her back and weakness which I want Dr. Lynann Bologna to address since he did her back operation. I have sent him a message about seeing her. She can see Dr. Lorelei Pont her in the office or go back to the orthopedic surgeon who saw her for her knees before. Please tell her it is important to be on both the metformin and Lantus insulin to bring down her blood sugars. s

## 2018-06-25 ENCOUNTER — Encounter: Payer: Self-pay | Admitting: Family Medicine

## 2018-06-26 NOTE — Telephone Encounter (Signed)
Noted  

## 2018-07-14 ENCOUNTER — Other Ambulatory Visit: Payer: Self-pay

## 2018-07-14 DIAGNOSIS — G43709 Chronic migraine without aura, not intractable, without status migrainosus: Secondary | ICD-10-CM

## 2018-07-14 DIAGNOSIS — F3170 Bipolar disorder, currently in remission, most recent episode unspecified: Secondary | ICD-10-CM

## 2018-07-15 ENCOUNTER — Other Ambulatory Visit: Payer: Self-pay

## 2018-07-15 DIAGNOSIS — E1159 Type 2 diabetes mellitus with other circulatory complications: Secondary | ICD-10-CM

## 2018-07-15 MED ORDER — METHOCARBAMOL 500 MG PO TABS: ORAL_TABLET | ORAL | 1 refills | Status: DC

## 2018-07-15 MED ORDER — MELOXICAM 15 MG PO TABS: ORAL_TABLET | ORAL | 0 refills | Status: DC

## 2018-07-15 MED ORDER — DULOXETINE HCL 60 MG PO CPEP: 60.0000 mg | ORAL_CAPSULE | Freq: Every day | ORAL | 1 refills | Status: DC

## 2018-07-15 MED ORDER — ACCU-CHEK GUIDE CONTROL VI LIQD: 1.0000 | 3 refills | Status: AC

## 2018-07-15 MED ORDER — METFORMIN HCL 1000 MG PO TABS: 1000.0000 mg | ORAL_TABLET | Freq: Two times a day (BID) | ORAL | 1 refills | Status: DC

## 2018-07-15 MED ORDER — AMITRIPTYLINE HCL 25 MG PO TABS: ORAL_TABLET | ORAL | 1 refills | Status: DC

## 2018-07-15 NOTE — Telephone Encounter (Signed)
Spoke with patient. Patient is checking her sugar twice a day. The past 3 days or so fasting has been around 120 am and in the evenings around 94 or 95. She is using her insulin and taking Metformin. She is not able to tell if she is having any unusual symptoms with lower sugar readings because she already feels weak from her waist down on daily basis. She wanted to let Jackelyn Poling know that she had an MRI and has a follow up appointment with Dr. Windy Fast on July 6th (patient states they should have sent [place] notes about this). Patient has an appointment with Jackelyn Poling in August but she wanted to make sure that we did not forget about her issue with lower legs that are still bothering her. She states that this was discussed during last visit also.

## 2018-07-15 NOTE — Telephone Encounter (Signed)
Please call and check on patient. She had elevated blood sugars a couple of weeks ago, please see how she is doing.

## 2018-07-15 NOTE — Telephone Encounter (Signed)
Refill request for Methocarbamol from [facility]. Last filled on 01/15/2018 for #30 with 1 refill to local pharmacy. Please review

## 2018-07-16 ENCOUNTER — Other Ambulatory Visit: Payer: Self-pay | Admitting: *Deleted

## 2018-07-16 DIAGNOSIS — E118 Type 2 diabetes mellitus with unspecified complications: Secondary | ICD-10-CM

## 2018-07-16 DIAGNOSIS — Z794 Long term (current) use of insulin: Secondary | ICD-10-CM

## 2018-07-16 MED ORDER — ACCU-CHEK GUIDE VI STRP: ORAL_STRIP | 3 refills | Status: DC

## 2018-07-16 MED ORDER — ACCU-CHEK FASTCLIX LANCETS MISC: 3 refills | Status: DC

## 2018-07-28 ENCOUNTER — Telehealth: Payer: Self-pay | Admitting: Family Medicine

## 2018-07-28 NOTE — Telephone Encounter (Signed)
Patient is requesting a call from PCP as soon as possible to discuss referral. States she received a call from the office she was referred to and the scheduler was rude to her. She is wanting to be referred to a different office. She did not state what office she had been on the phone with. She can be reached at [phone].

## 2018-07-28 NOTE — Telephone Encounter (Signed)
Please call patient and let her know that I am out of the office today. Please ask her which referral she is talking about.

## 2018-07-29 NOTE — Telephone Encounter (Signed)
Patient's daughter was notified of Autumn Valenzuela recommendations. Patient needs to keep appointment there and Dr. Lynann Bologna placed this referral.

## 2018-08-03 NOTE — Telephone Encounter (Signed)
Please call her and tell her that I got the note from [facility] orthopedic and ask if it is ok for me to put in a vascular referral as was recommended by Dr. Laurena Bering office to see if cause of leg heaviness can be determined?

## 2018-08-03 NOTE — Telephone Encounter (Signed)
Spoke with pt's daughter, Dwana Curd (on dpr) relaying Debbie's message.  Verbalizes understanding and says pt has already been referred to vascular and appt is 08/25/18.

## 2018-08-20 ENCOUNTER — Other Ambulatory Visit: Payer: Self-pay

## 2018-08-20 DIAGNOSIS — I739 Peripheral vascular disease, unspecified: Secondary | ICD-10-CM

## 2018-08-21 ENCOUNTER — Other Ambulatory Visit: Payer: Self-pay

## 2018-08-21 ENCOUNTER — Ambulatory Visit (INDEPENDENT_AMBULATORY_CARE_PROVIDER_SITE_OTHER): Payer: Medicare PPO | Admitting: Family Medicine

## 2018-08-21 ENCOUNTER — Encounter: Payer: Self-pay | Admitting: Family Medicine

## 2018-08-21 ENCOUNTER — Ambulatory Visit: Payer: Medicare PPO

## 2018-08-21 VITALS — BP 108/68 | HR 91 | Temp 98.4°F | Ht 61.0 in | Wt 218.8 lb

## 2018-08-21 DIAGNOSIS — E1159 Type 2 diabetes mellitus with other circulatory complications: Secondary | ICD-10-CM | POA: Diagnosis not present

## 2018-08-21 DIAGNOSIS — R232 Flushing: Secondary | ICD-10-CM

## 2018-08-21 DIAGNOSIS — R519 Headache, unspecified: Secondary | ICD-10-CM

## 2018-08-21 DIAGNOSIS — M2550 Pain in unspecified joint: Secondary | ICD-10-CM | POA: Diagnosis not present

## 2018-08-21 DIAGNOSIS — R51 Headache: Secondary | ICD-10-CM

## 2018-08-21 DIAGNOSIS — Z794 Long term (current) use of insulin: Secondary | ICD-10-CM

## 2018-08-21 DIAGNOSIS — M791 Myalgia, unspecified site: Secondary | ICD-10-CM | POA: Diagnosis not present

## 2018-08-21 DIAGNOSIS — Z6841 Body Mass Index (BMI) 40.0 and over, adult: Secondary | ICD-10-CM

## 2018-08-21 LAB — FOLLICLE STIMULATING HORMONE: FSH: 33.5 m[IU]/mL

## 2018-08-21 LAB — HIGH SENSITIVITY CRP: CRP, High Sensitivity: 6.08 mg/L — ABNORMAL HIGH (ref 0.000–5.000)

## 2018-08-21 MED ORDER — CYCLOBENZAPRINE HCL 10 MG PO TABS: 5.0000 mg | ORAL_TABLET | Freq: Every evening | ORAL | 1 refills | Status: DC | PRN

## 2018-08-21 NOTE — Patient Instructions (Signed)
Good to see you today  Can try Tylenol Arthritis 2 tablets every 8 to 12 hours as needed  I have sent in a muscle relaxer to your pharmacy for bedtime use as needed  I will notify you of lab results  Please follow up in 6 weeks for complete physical

## 2018-08-21 NOTE — Progress Notes (Signed)
Subjective:    Patient ID: Autumn Valenzuela, female    DOB: August 30, 1960, 58 y.o.   MRN: [id number]  HPI This is a 58 yo female who presents today for follow up of chronic medical conditions. She is accompanied by her mother.   DM- most readings under 200. Currently taking insulin Lantus 10 mg nightly. 97 is lowest reading. She has been watching her diet very carefully.   Myalgias and arthralgias- Lower extremity pain, worse at bedtime, pain radiates down legs. Some improvement with meloxicam. She is not currently taking any muscle relaxers. Worse after being on her feet for long periods of time. Has appointment next week for bilateral LE vascular studies to rule out vascular disease. She continues to follow up with ortho for back pain and knee pain. Increased pain in shoulders and hips this is new for her. She had two back surgeries earlier this year (fusion x 2) and has had some PT but feels very weak and with decreased endurance.   Dizziness- had an episode while out shopping. Singular, resolved spontaneously.   Increased sweating, hot sweats. Good fluid intake. Very disruptive, interrupting sleep along with leg pain.   Headaches- currently on topiramate 100 mg bid, headaches more frequent recently.   Past Medical History:  Diagnosis Date  . Back pain   . Bipolar disorder ([place])   . CVA (cerebral infarction)   . Depression   . Diabetes mellitus without complication ([place])   . High cholesterol   . Hypertension   . Left leg pain   . Migraine    Past Medical History:  Diagnosis Date  . Back pain   . Bipolar disorder ([place])   . CVA (cerebral infarction)   . Depression   . Diabetes mellitus without complication ([place])   . High cholesterol   . Hypertension   . Left leg pain   . Migraine    Social History   Tobacco Use  . Smoking status: Former Smoker    Quit date: 12/13/2015    Years since quitting: 2.6  . Smokeless tobacco: Never Used  Substance Use Topics  . Alcohol  use: No  . Drug use: No    Types: Marijuana    Comment: occ - states stopped smokin weed 04/2014      Review of Systems Per HPI    Objective:   Physical Exam Vitals signs reviewed.  Constitutional:      General: She is not in acute distress.    Appearance: Normal appearance. She is obese. She is not ill-appearing, toxic-appearing or diaphoretic.  HENT:     Head: Normocephalic and atraumatic.  Neck:     Musculoskeletal: Normal range of motion and neck supple.  Cardiovascular:     Rate and Rhythm: Normal rate.     Pulses: Normal pulses.  Pulmonary:     Effort: Pulmonary effort is normal.  Musculoskeletal:     Comments: UE, LE good ROM. Left knee > right knee. Stable gait without assistant devices. UE with good ROM. Generalized tenderness shoulders, back, thighs.   Skin:    General: Skin is warm and dry.  Neurological:     Mental Status: She is alert and oriented to person, place, and time.  Psychiatric:        Mood and Affect: Mood normal.        Behavior: Behavior normal.        Thought Content: Thought content normal.        Judgment: Judgment normal.  BP 108/68 (BP Location: Right Arm, Patient Position: Sitting, Cuff Size: Normal)   Pulse 91   Temp 98.4 F (36.9 C) (Temporal)   Ht 5\' 1"  (1.549 m)   Wt 218 lb 12.8 oz (99.2 kg)   SpO2 91%   BMI 41.34 kg/m  Wt Readings from Last 3 Encounters:  08/21/18 218 lb 12.8 oz (99.2 kg)  06/22/18 218 lb 12 oz (99.2 kg)  06/18/18 223 lb (101.2 kg)       Assessment & Plan:  1. Hot flashes - will check [place]. Not a candidate for hormonal replacement therapy due to history of CVA. Will consider gabapentin which may also help with her chronic pain.  - Follicle stimulating hormone  2. Arthralgia of multiple sites, bilateral - more recent onset of shoulder and hip pain, will check labs to rule out RA/PMR - High sensitivity CRP - Rheumatoid factor  3. Myalgia - also discussed use of acetaminophen, heat/ice -  cyclobenzaprine (FLEXERIL) 10 MG tablet; Take 0.5-1 tablets (5-10 mg total) by mouth at bedtime as needed for muscle spasms.  Dispense: 30 tablet; Refill: 1  4. Type 2 diabetes mellitus with other circulatory complication, with long-term current use of insulin ([place]) - doing better with home readings, too soon to check hemoglobin A1c, will have CPE and follow up at next visit  5. Chronic daily headache - will see if improved with treating myalgias and improved sleep, may also benefit with gabapentin  6. Class 3 severe obesity due to excess calories with serious comorbidity and body mass index (BMI) of 40.0 to 44.9 in adult [facility]) - has lost weight with improved diet, encouraged her efforts   Clarene Reamer, FNP-BC  [facility] [place]  08/23/2018 12:36 PM

## 2018-08-22 LAB — RHEUMATOID FACTOR: Rheumatoid fact SerPl-aCnc: <14 IU/mL (ref <14)

## 2018-08-23 ENCOUNTER — Encounter: Payer: Self-pay | Admitting: Family Medicine

## 2018-08-25 ENCOUNTER — Ambulatory Visit (INDEPENDENT_AMBULATORY_CARE_PROVIDER_SITE_OTHER): Payer: Medicare PPO | Admitting: Vascular Surgery

## 2018-08-25 ENCOUNTER — Other Ambulatory Visit: Payer: Self-pay

## 2018-08-25 ENCOUNTER — Encounter: Payer: Self-pay | Admitting: Vascular Surgery

## 2018-08-25 ENCOUNTER — Ambulatory Visit (HOSPITAL_COMMUNITY)
Admission: RE | Admit: 2018-08-25 | Discharge: 2018-08-25 | Disposition: A | Discharge status: Home / Self Care | Payer: Medicare PPO | Source: Ambulatory Visit | Attending: Family | Admitting: Family

## 2018-08-25 VITALS — BP 132/79 | HR 79 | Temp 97.7°F | Resp 20 | Ht 61.0 in | Wt 218.9 lb

## 2018-08-25 DIAGNOSIS — I739 Peripheral vascular disease, unspecified: Secondary | ICD-10-CM

## 2018-08-25 NOTE — Progress Notes (Signed)
Vascular and Vein Specialist of [facility]  Patient name: Autumn Valenzuela MRN: [id number] DOB: Apr 12, 1960 Sex: female  REASON FOR CONSULT: Evaluation of lower extremity discomfort.  HPI: Autumn Valenzuela is a 58 y.o. female, who is here today for discussion of lower extremity discomfort.  She reports that this has been progressive for her she reports weakness in the lower half of her body extending from her buttocks lateral thighs knees calves and onto the top of her feet bilaterally.  She reports this is resulted in her falling on one occasion.  He also describes similar weakness and discomfort on the posterior aspect of both upper arms extending to her elbow.  She did undergo L4-5 lumbar surgery and January 2020.  She is seen today to determine if arterial insufficiency could be leading to her lower extremity symptoms.  She denies any history of cardiac disease.  Past Medical History:  Diagnosis Date  . Back pain   . Bipolar disorder ([place])   . CVA (cerebral infarction)   . Depression   . Diabetes mellitus without complication ([place])   . High cholesterol   . Hypertension   . Left leg pain   . Migraine     Family History  Problem Relation Age of Onset  . Diabetes Mother   . Stroke Father   . Cancer Paternal Aunt   . Cancer Paternal Aunt   . Diabetes Brother     SOCIAL HISTORY: Social History   Socioeconomic History  . Marital status: Single    Spouse name: Not on file  . Number of children: 3  . Years of education: 48  . Highest education level: Not on file  Occupational History  . Not on file  Social Needs  . Financial resource strain: Not on file  . Food insecurity    Worry: Not on file    Inability: Not on file  . Transportation needs    Medical: Not on file    Non-medical: Not on file  Tobacco Use  . Smoking status: Former Smoker    Quit date: 12/13/2015    Years since quitting: 2.7  . Smokeless tobacco: Never Used   Substance and Sexual Activity  . Alcohol use: No  . Drug use: No    Types: Marijuana    Comment: occ - states stopped smokin weed 04/2014  . Sexual activity: Yes    Birth control/protection: None  Lifestyle  . Physical activity    Days per week: Not on file    Minutes per session: Not on file  . Stress: Not on file  Relationships  . Social Herbalist on phone: Not on file    Gets together: Not on file    Attends religious service: Not on file    Active member of club or organization: Not on file    Attends meetings of clubs or organizations: Not on file    Relationship status: Not on file  . Intimate partner violence    Fear of current or ex partner: Not on file    Emotionally abused: Not on file    Physically abused: Not on file    Forced sexual activity: Not on file  Other Topics Concern  . Not on file  Social History Narrative   Patient is single with 3 children.   Patient is right handed.   Patient has a high school education.   Patient drinks 3 cups daily.    Allergies  Allergen  Reactions  . Cefazolin Swelling and Other (See Comments)    Rapid iv infusion caused swelling and burning at the iv site    Current Outpatient Medications  Medication Sig Dispense Refill  . Accu-Chek FastClix Lancets MISC TEST 4 TIMES DAILY. DX E11.9 306 each 3  . albuterol (PROVENTIL HFA;VENTOLIN HFA) 108 (90 Base) MCG/ACT inhaler Inhale 2 puffs into the lungs daily as needed for wheezing or shortness of breath. 18 g 2  . albuterol (PROVENTIL) (2.5 MG/3ML) 0.083% nebulizer solution Take 2.5 mg by nebulization at bedtime as needed for wheezing.    Marland Kitchen ALPRAZolam (XANAX) 0.5 MG tablet Take 1 tablet (0.5 mg total) by mouth daily as needed for anxiety. 30 tablet 2  . amitriptyline (ELAVIL) 25 MG tablet TAKE 1 TABLET(25 MG) BY MOUTH AT BEDTIME 90 tablet 1  . Blood Glucose Calibration (ACCU-CHEK GUIDE CONTROL) LIQD 1 each by In Vitro route every 30 (thirty) days. Needs Accu-check guide  monitor system. DX. E11.9 3 each 3  . calcium-vitamin D (OSCAL WITH D) 500-200 MG-UNIT per tablet Take 1 tablet by mouth daily with breakfast.    . cyclobenzaprine (FLEXERIL) 10 MG tablet Take 0.5-1 tablets (5-10 mg total) by mouth at bedtime as needed for muscle spasms. 30 tablet 1  . DULoxetine (CYMBALTA) 60 MG capsule Take 1 capsule (60 mg total) by mouth at bedtime. 90 capsule 1  . fluticasone (FLONASE) 50 MCG/ACT nasal spray Place 2 sprays into both nostrils daily. (Patient taking differently: Place 2 sprays into both nostrils daily as needed for allergies. ) 16 g 0  . glucose blood (ACCU-CHEK GUIDE) test strip Use to test blood sugar 4 times a day.  Dx: E11.9 400 each 3  . Insulin Glargine (LANTUS) 100 UNIT/ML Solostar Pen Inject 10 Units into the skin daily. If blood sugar over 200, increase by 2 units up to 40 units. 15 mL 1  . Insulin Pen Needle 31G X 5 MM MISC 1 Units by Does not apply route at bedtime. 90 each 3  . ketorolac (ACULAR) 0.4 % SOLN Place 1 drop into both eyes daily as needed (eye irritation).     Marland Kitchen levocetirizine (XYZAL) 5 MG tablet Take 1 tablet (5 mg total) by mouth every evening. 30 tablet 2  . loratadine (CLARITIN) 10 MG tablet Take 1 tablet (10 mg total) by mouth daily. (Patient taking differently: Take 10 mg by mouth daily as needed for allergies. ) 30 tablet 0  . meloxicam (MOBIC) 15 MG tablet One tablet every 24 hours as needed for inflammation. 90 tablet 0  . metFORMIN (GLUCOPHAGE) 1000 MG tablet Take 1 tablet (1,000 mg total) by mouth 2 (two) times daily with a meal. 180 tablet 1  . methocarbamol (ROBAXIN) 500 MG tablet TAKE 1 TABLET(500 MG) BY MOUTH EVERY 12 HOURS AS NEEDED FOR MUSCLE SPASMS 60 tablet 1  . ondansetron (ZOFRAN-ODT) 8 MG disintegrating tablet Take 1 tablet (8 mg total) by mouth every 8 (eight) hours as needed for nausea. 20 tablet 0  . topiramate (TOPAMAX) 100 MG tablet Take 1 tablet (100 mg total) by mouth 2 (two) times daily. 60 tablet 2  . traMADol  (ULTRAM) 50 MG tablet Take 50 mg by mouth 3 (three) times daily as needed.    . triamcinolone ointment (KENALOG) 0.1 % Apply topically 2 (two) times daily. For no more than 10 days. 30 g 1  . Vitamin D, Ergocalciferol, (DRISDOL) 50000 units CAPS capsule Take 1 capsule (50,000 Units total) by mouth every  7 (seven) days. 12 capsule 3   No current facility-administered medications for this visit.     REVIEW OF SYSTEMS:  [X]  denotes positive finding, [ ]  denotes negative finding Cardiac  Comments:  Chest pain or chest pressure: x   Shortness of breath upon exertion:    Short of breath when lying flat:    Irregular heart rhythm:        Vascular    Pain in calf, thigh, or hip brought on by ambulation: x   Pain in feet at night that wakes you up from your sleep:  x   Blood clot in your veins:    Leg swelling:  x       Pulmonary    Oxygen at home: x   Productive cough:  x   Wheezing:         Neurologic    Sudden weakness in arms or legs:  x   Sudden numbness in arms or legs:  x   Sudden onset of difficulty speaking or slurred speech: x   Temporary loss of vision in one eye:  x   Problems with dizziness:  x       Gastrointestinal    Blood in stool:     Vomited blood:         Genitourinary    Burning when urinating:     Blood in urine:        Psychiatric    Major depression:  x       Hematologic    Bleeding problems:    Problems with blood clotting too easily:        Skin    Rashes or ulcers: x       Constitutional    Fever or chills: x     PHYSICAL EXAM: Vitals:   08/25/18 1057  BP: 132/79  Pulse: 79  Resp: 20  Temp: 97.7 F (36.5 C)  SpO2: 96%  Weight: 99.3 kg  Height: 5\' 1"  (1.549 m)    GENERAL: The patient is a well-nourished female, in no acute distress. The vital signs are documented above. CARDIOVASCULAR: Carotid arteries without bruits bilaterally.  Heart regular rate and rhythm.  Radial pulses 2+ bilaterally.  2+ popliteal and 2+ dorsalis pedis  pulses bilaterally PULMONARY: There is good air exchange  ABDOMEN: Soft and non-tender  MUSCULOSKELETAL: There are no major deformities or cyanosis. NEUROLOGIC: No focal weakness or paresthesias are detected. SKIN: There are no ulcers or rashes noted. PSYCHIATRIC: The patient has a normal affect.  DATA:  Noninvasive studies reveal normal ankle arm index bilaterally and normal triphasic waveforms bilaterally  MEDICAL ISSUES: I discussed these findings with the patient.  I do not see any evidence of arterial pathology to explain her symptoms.  She will continue her follow-up with Dr. Lynann Bologna for ongoing evaluation and see [place] on an as-needed basis   Rosetta Posner, MD [facility] Vascular and Vein Specialists of [facility] Tel [phone] Pager [phone]

## 2018-08-28 ENCOUNTER — Other Ambulatory Visit: Payer: Self-pay | Admitting: Family Medicine

## 2018-08-28 MED ORDER — METFORMIN HCL ER 500 MG PO TB24: 500.0000 mg | ORAL_TABLET | Freq: Two times a day (BID) | ORAL | 1 refills | Status: DC

## 2018-08-31 ENCOUNTER — Ambulatory Visit (INDEPENDENT_AMBULATORY_CARE_PROVIDER_SITE_OTHER): Payer: Medicare PPO | Admitting: Family Medicine

## 2018-08-31 ENCOUNTER — Other Ambulatory Visit: Payer: Self-pay

## 2018-08-31 ENCOUNTER — Encounter: Payer: Self-pay | Admitting: Family Medicine

## 2018-08-31 VITALS — BP 102/70 | HR 116 | Temp 98.7°F | Ht 61.0 in | Wt 216.1 lb

## 2018-08-31 DIAGNOSIS — M546 Pain in thoracic spine: Secondary | ICD-10-CM

## 2018-08-31 DIAGNOSIS — R232 Flushing: Secondary | ICD-10-CM | POA: Diagnosis not present

## 2018-08-31 DIAGNOSIS — G8929 Other chronic pain: Secondary | ICD-10-CM | POA: Diagnosis not present

## 2018-08-31 DIAGNOSIS — M549 Dorsalgia, unspecified: Secondary | ICD-10-CM | POA: Diagnosis not present

## 2018-08-31 MED ORDER — GABAPENTIN 300 MG PO CAPS: ORAL_CAPSULE | ORAL | 1 refills | Status: DC

## 2018-08-31 NOTE — Progress Notes (Signed)
Subjective:    Patient ID: Autumn Valenzuela, female    DOB: [birth year], 58 y.o.   MRN: [id number]  HPI This is a 58 yo female who presents today with worsening shoulder pain and swelling. She was seen 10 days ago with worsening generalized arthralgias and myalgias especially in shoulders and hips. Labs were drawn to check for PMR/inflammatory process and these were negative.  She reports continued worsening of shoulder pain with numbness, tingling and weakness as well as difficulty raising arms above shoulders bilaterally. Pain across shoulders and into neck.  Shoulder pain keeping her awake at night, have tried NSAIDs and muscle relaxers without relief. She is changing PT to [facility] outpatient as her insurance will cover 100%. Has upcoming appointment.   DM type 2- Her daughters have been concerned about her diarrhea on metformin. I have sent in a new prescription for extended release to her mail order pharmacy and discussed using Immodium prn. She reports that her blood sugars are consistently running under 150.   [place] checked with last lab draw and shows she is post menopausal. Currently suffering with frequent hot flashes.   Past Medical History:  Diagnosis Date  . Back pain   . Bipolar disorder ([place])   . CVA (cerebral infarction)   . Depression   . Diabetes mellitus without complication ([place])   . High cholesterol   . Hypertension   . Left leg pain   . Migraine    Past Surgical History:  Procedure Laterality Date  . ANTERIOR LATERAL LUMBAR FUSION WITH PERCUTANEOUS SCREW 1 LEVEL Left 02/11/2018   Procedure: LEFT LATERAL LUMBAR  FOUR-FIVE INTERBODY FUSION WITH INSTRUMENTATION AND ALLOGRAFT;  Surgeon: Phylliss Bob, MD;  Location: [facility];  Service: Orthopedics;  Laterality: Left;  LEFT LATERAL LUMBAR  FOUR-FIVE INTERBODY FUSION WITH INSTRUMENTATION AND ALLOGRAFT  . BACK SURGERY     Lumbar fusion L5-S1  . DIABETES    . EYE SURGERY Bilateral   . KNEE SURGERY    . NEUROFORAMINAL  STENOSIS Left    Severe L4-L5, involving the level above previous fusion.  Marland Kitchen PARTIAL HYSTERECTOMY    . RADICULOPATHY Left    L4, Secondary to an L4-L5 Spondylolisthesis  . SHOULDER SURGERY     Family History  Problem Relation Age of Onset  . Diabetes Mother   . Stroke Father   . Cancer Paternal Aunt   . Cancer Paternal Aunt   . Diabetes Brother    Social History   Tobacco Use  . Smoking status: Former Smoker    Quit date: 12/13/2015    Years since quitting: 2.7  . Smokeless tobacco: Never Used  Substance Use Topics  . Alcohol use: No  . Drug use: No    Types: Marijuana    Comment: occ - states stopped smokin weed 04/2014      Review of Systems Per HPI    Objective:   Physical Exam Vitals signs reviewed.  Constitutional:      General: She is not in acute distress.    Appearance: She is obese. She is ill-appearing (appears uncomfortable. ). She is not toxic-appearing or diaphoretic.  HENT:     Head: Normocephalic and atraumatic.  Eyes:     Conjunctiva/sclera: Conjunctivae normal.  Cardiovascular:     Rate and Rhythm: Normal rate and regular rhythm.     Heart sounds: Normal heart sounds.     Comments: Nml HR on auscultation.  Pulmonary:     Effort: Pulmonary effort is normal.  Breath sounds: Normal breath sounds.  Musculoskeletal:     Comments: Unable to raise arms above shoulders. UE strength 4/5. No shoulder joint space tenderness. Diffuse tenderness across upper back, left shoulder higher than right. No scapula or clavicle deformities visualized.   Skin:    General: Skin is warm and dry.  Neurological:     Mental Status: She is alert and oriented to person, place, and time.  Psychiatric:        Mood and Affect: Mood normal.        Behavior: Behavior normal.        Thought Content: Thought content normal.        Judgment: Judgment normal.       BP 102/70 (BP Location: Right Arm, Patient Position: Sitting, Cuff Size: Large)   Pulse (!) 116   Temp  98.7 F (37.1 C) (Temporal)   Ht 5\' 1"  (1.549 m)   Wt 216 lb 1.9 oz (98 kg)   SpO2 98%   BMI 40.84 kg/m  Wt Readings from Last 3 Encounters:  08/31/18 216 lb 1.9 oz (98 kg)  08/25/18 218 lb 14.4 oz (99.3 kg)  08/21/18 218 lb 12.8 oz (99.2 kg)       Assessment & Plan:  1. Acute bilateral thoracic back pain - suspect symptoms coming from upper back, will contact her spinal surgeon, Dr. Lynann Bologna, to see if her appointment can be moved up.   2. Chronic bilateral back pain, unspecified back location - gabapentin (NEURONTIN) 300 MG capsule; Take one capsule at bedtime for 7 days then increase to twice a day (for first fill only)  Dispense: 60 capsule; Refill: 1  3. Hot flashes - discussed treatment options for hot flashes, she is not a candidate for HRT due to previous CVA. Will try gabapentin in hopes that this will also help with some of her musculoskeletal pain.  - gabapentin (NEURONTIN) 300 MG capsule; Take one capsule at bedtime for 7 days then increase to twice a day (for first fill only)  Dispense: 60 capsule; Refill: [place], FNP-BC  [facility]  09/01/2018 7:39 AM

## 2018-08-31 NOTE — Patient Instructions (Addendum)
I sent in an order for extended release metformin- Metformin ER to your mail order pharmacy to see if that helps with your diarrhea  Can also take immodium for diarrhea  I have sent in a medication for gabapentin which will help with pain and hot flashes, take one at night for a week and then take twice a day.   I will reach out to Dr. Jonni Sanger about getting you in sooner

## 2018-09-09 ENCOUNTER — Other Ambulatory Visit: Payer: Self-pay

## 2018-09-09 ENCOUNTER — Encounter: Payer: Self-pay | Admitting: Physical Therapy

## 2018-09-09 ENCOUNTER — Ambulatory Visit: Payer: Medicare PPO | Attending: Orthopedic Surgery | Admitting: Physical Therapy

## 2018-09-09 DIAGNOSIS — G8929 Other chronic pain: Secondary | ICD-10-CM | POA: Diagnosis present

## 2018-09-09 DIAGNOSIS — M5441 Lumbago with sciatica, right side: Secondary | ICD-10-CM | POA: Diagnosis present

## 2018-09-09 DIAGNOSIS — M5442 Lumbago with sciatica, left side: Secondary | ICD-10-CM | POA: Diagnosis present

## 2018-09-09 DIAGNOSIS — M6281 Muscle weakness (generalized): Secondary | ICD-10-CM

## 2018-09-09 DIAGNOSIS — M546 Pain in thoracic spine: Secondary | ICD-10-CM

## 2018-09-10 NOTE — Therapy (Signed)
[place], [place], [address] Phone: [phone]   Fax:  [phone]  Physical Therapy Evaluation  Patient Details  Name: Autumn Valenzuela MRN: [id number] Date of Birth: October 03, 1960 Referring Provider (PT): Dr. Lynann Bologna    Encounter Date: 09/09/2018  PT End of Session - 09/09/18 0954    Visit Number  1    Number of Visits  8    Date for PT Re-Evaluation  11/04/18    Authorization Type  [employer] MCR/ MCD    PT Start Time  0915    PT Stop Time  1000    PT Time Calculation (min)  45 min    Activity Tolerance  Patient tolerated treatment well    Behavior During Therapy  [facility] for tasks assessed/performed       Past Medical History:  Diagnosis Date  . Back pain   . Bipolar disorder ([place])   . CVA (cerebral infarction)   . Depression   . Diabetes mellitus without complication ([place])   . High cholesterol   . Hypertension   . Left leg pain   . Migraine     Past Surgical History:  Procedure Laterality Date  . ANTERIOR LATERAL LUMBAR FUSION WITH PERCUTANEOUS SCREW 1 LEVEL Left 02/11/2018   Procedure: LEFT LATERAL LUMBAR  FOUR-FIVE INTERBODY FUSION WITH INSTRUMENTATION AND ALLOGRAFT;  Surgeon: Phylliss Bob, MD;  Location: [place];  Service: Orthopedics;  Laterality: Left;  LEFT LATERAL LUMBAR  FOUR-FIVE INTERBODY FUSION WITH INSTRUMENTATION AND ALLOGRAFT  . BACK SURGERY     Lumbar fusion L5-S1  . DIABETES    . EYE SURGERY Bilateral   . KNEE SURGERY    . NEUROFORAMINAL STENOSIS Left    Severe L4-L5, involving the level above previous fusion.  Marland Kitchen PARTIAL HYSTERECTOMY    . RADICULOPATHY Left    L4, Secondary to an L4-L5 Spondylolisthesis  . SHOULDER SURGERY      There were no vitals filed for this visit.   Subjective Assessment - 09/09/18 0921    Subjective  Patient had lumbar fusion 02/11/2018.  She reports having done some PT after the surgery, with some benefit. It is unclear why she stopped PT.   She  continues to have pain in her low back, radiating up into her middle back and down into both legs.    She has numbness and tingling in her fingers and toes.  She has difficulty bending and getting back up.  She cannot stand or walk long periods.  She has difficulty lifting and squatting.    Pertinent History  lumbar fusion 2008, 2020.  HTN, bipolar disorder, HTN, diabetes, knee pain, CVA    Limitations  Standing;Walking;House hold activities;Lifting;Other (comment)   sleep   Diagnostic tests  none recent    Patient Stated Goals  Pain relief    Currently in Pain?  Yes    Pain Score  7     Pain Location  Back    Pain Orientation  Lower    Pain Descriptors / Indicators  Burning;Aching    Pain Type  Chronic pain    Pain Radiating Towards  bilateral LEs to feet, R >L    Pain Onset  More than a month ago    Pain Frequency  Constant    Aggravating Factors   standing, walking, bending    Pain Relieving Factors  nothing helps the pain , pain meds can help sometimes    Effect of Pain on Daily Activities  can't manage the carseat, holding  and lifting grandson (1 yrs)    Multiple Pain Sites  No         [facility] PT Assessment - 09/09/18 0001      Assessment   Medical Diagnosis  low back pain s/p fusion     Referring Provider (PT)  Dr. Lynann Bologna     Onset Date/Surgical Date  02/11/18    Next MD Visit  having another MRI , needs to call     Prior Therapy  Yes       Precautions   Precautions  None      Restrictions   Weight Bearing Restrictions  No      Balance Screen   Has the patient fallen in the past 6 months  Yes    How many times?  1   slipped on water in the kitchen    Has the patient had a decrease in activity level because of a fear of falling?   Yes    Is the patient reluctant to leave their home because of a fear of falling?   No      Home Environment   Living Environment  Private residence    Living Arrangements  Children    Type of [place] to  enter    Home Layout  One level    [employer] - 2 wheels;Cane - single point    Additional Comments  uses her cane intermittently       Prior Function   Level of Independence  Independent with basic ADLs;Independent with household mobility without device;Independent with community mobility without device    Leisure  hang with grandkids       Cognition   Overall Cognitive Status  Within Functional Limits for tasks assessed    Behaviors  Verbal agitation   easily redirected     Observation/Other Assessments   Focus on Therapeutic Outcomes (FOTO)   70%      Sensation   Light Touch  Impaired by gross assessment      Functional Tests   Functional tests  Squat      Squat   Comments  no pain , partial       Posture/Postural Control   Posture/Postural Control  Postural limitations    Postural Limitations  Rounded Shoulders;Forward head;Flexed trunk    Posture Comments  genu varus , obese       AROM   Lumbar Flexion  needs to bend knees , pain 25%     Lumbar Extension  50% bends knees pain     Lumbar - Right Side Bend  WFL     Lumbar - Left Side Bend  WFL     Lumbar - Right Rotation  25% no pain     Lumbar - Left Rotation  25% no pain       PROM   Overall PROM Comments  hip WNL, Rt hip IR excessive 70 deg +      Strength   Right Hip Flexion  4-/5    Right Hip ABduction  4-/5    Left Hip Flexion  4-/5    Left Hip ABduction  3+/5    Right Knee Flexion  5/5    Right Knee Extension  5/5    Left Knee Flexion  3+/5    Left Knee Extension  3+/5    Right Ankle Dorsiflexion  4+/5    Left  Ankle Dorsiflexion  4+/5      Flexibility   Soft Tissue Assessment /Muscle Length  yes    Hamstrings  much tighter L >R , about 65 deg vs Rt side 80 deg PROM hip flexion in SLR       Palpation   Palpation comment  "good sore" when lower back palpated along sacrum and throughout lower lumbar region       Special Tests    Special Tests  Lumbar    Lumbar Tests  Prone Knee Bend  Test;Straight Leg Raise      Prone Knee Bend Test   Findings  Negative    Comment  bilat.       Straight Leg Raise   Findings  Negative    Comment  bilateral                 Objective measurements completed on examination: See above findings.              PT Education - 09/09/18 2024    Education Details  PT/POC , HEP, [id number] sanitation policies, Geophysicist/field seismologist) Educated  Patient    Methods  Explanation;Handout;Verbal cues;Demonstration    Comprehension  Verbalized understanding;Returned demonstration       PT Short Term Goals - 09/09/18 2025      PT SHORT TERM GOAL #1   Title  Pt will be I with initial HEP    Time  3    Period  Weeks    Status  New    Target Date  09/30/18      PT SHORT TERM GOAL #2   Title  Pt will be able to show good form with squatting and lifting light items from the floor to ease strain on back    Time  3    Period  Weeks    Status  New    Target Date  09/30/18      PT SHORT TERM GOAL #3   Title  Pt will report less pain in low back with basic home tasks and holding her grandson, no more than moderate 5/10    Time  3    Period  Weeks    Status  New    Target Date  09/30/18        PT Long Term Goals - 09/09/18 2028      PT LONG TERM GOAL #1   Title  Pt will be I with HEP for back, LE strength    Time  8    Period  Weeks    Status  New    Target Date  11/04/18      PT LONG TERM GOAL #2   Title  Pt will lower FOTO score to 55% or better to show improved functional mobility.    Time  8    Period  Weeks    Status  New    Target Date  11/04/18      PT LONG TERM GOAL #3   Title  LLE strength will improve to 4+/5 or more for more stability to gait, lifting and carrying    Time  8    Period  Weeks    Status  New    Target Date  11/04/18      PT LONG TERM GOAL #4   Title  Pt will lift, carry carseat with good mechanics and pain minimal    Time  8    Period  Weeks    Status  New    Target Date   11/04/18             Plan - 09/09/18 2028    Clinical Impression Statement  Pt presents for mod complexity eval of low back pain which radiates into middle back as well as both LEs.  She shows decreased strength in LLE and generalized lower extremity pain, cramping. She has difficulty standing and walking in the community and is unable to lift, hold her grandson without increasing pain.  She has had PT prior but it is unclear if she made any progress, will work with her to establish a HEP and encourage more physical activity.    Personal Factors and Comorbidities  Behavior Pattern;Comorbidity 1;Comorbidity 2;Education;Social Background;Past/Current Experience;Comorbidity 3+    Comorbidities  previous lumbar surgery, diabetes, fall, Rt shoulder pain    Examination-Activity Limitations  Locomotion Level;Carry;Squat;Stand;Lift    Examination-Participation Restrictions  Cleaning;Meal Prep;Community Activity;Interpersonal Relationship;Shop    Stability/Clinical Decision Making  Evolving/Moderate complexity    Clinical Decision Making  Moderate    Rehab Potential  Good    PT Frequency  1x / week    PT Duration  8 weeks    PT Treatment/Interventions  ADLs/Self Care Home Management;Manual techniques;Functional mobility training;Moist Heat;Cryotherapy;Electrical Stimulation;Therapeutic exercise;Therapeutic activities;Balance training;Patient/family education;Passive range of motion    PT Next Visit Plan  check HEP, strengthen core, trunk flexibillity, NuStep, body mechanics    PT Home Exercise Plan  posterior tilt, bridge, LTR    Consulted and Agree with Plan of Care  Patient       Patient will benefit from skilled therapeutic intervention in order to improve the following deficits and impairments:  Increased fascial restricitons, Impaired sensation, Improper body mechanics, Pain, Postural dysfunction, Increased muscle spasms, Decreased mobility, Decreased activity tolerance, Decreased range of  motion, Decreased strength, Obesity, Impaired flexibility, Decreased balance, Difficulty walking  Visit Diagnosis: Chronic bilateral low back pain with bilateral sciatica  Pain in thoracic spine  Muscle weakness (generalized)     Problem List Patient Active Problem List   Diagnosis Date Noted  . Acute pain of both knees 06/18/2018  . Radiculopathy 02/11/2018  . Bipolar disorder ([place]) 03/29/2016  . Diabetic ketoacidosis ([place]) 03/24/2016  . Dermatitis 03/24/2016  . HTN (hypertension) 03/24/2016  . HLD (hyperlipidemia) 03/24/2016  . DM (diabetes mellitus) ([place]) 03/24/2016  . Hyperglycemia   . Chronic daily headache 11/23/2012  . 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 Depression 11/23/2012    Braxden Lovering 09/10/2018, 7:11 AM  [facility] [address] [place], [place], [address] Phone: [phone]   Fax:  [phone]  Name: Autumn Valenzuela MRN: [id number] Date of Birth: 28-Jun-1960   Raeford Razor, PT 09/10/18 7:11 AM Phone: [phone] Fax: [phone]

## 2018-09-15 ENCOUNTER — Telehealth: Payer: Self-pay | Admitting: Family Medicine

## 2018-09-15 NOTE — Telephone Encounter (Signed)
Best number [phone] Pt called wanting to know  Is there anything you can give her for pain in back & upper part of arms  Pt stated you are aware of this  [facility] [place] [address]

## 2018-09-15 NOTE — Telephone Encounter (Signed)
Please advise 

## 2018-09-16 NOTE — Telephone Encounter (Signed)
Please call patient and ask her if the gabapentin is helping? Is she taking twice a day? What else is she taking for pain? Any NSAIDs, Tylenol?

## 2018-09-16 NOTE — Telephone Encounter (Signed)
Pt states that she is taking her Gabapentin BID and Tylenol PRN Pt states that the throbbing pain has gotten worse and is lasting longer. Now worse in hands.  Pt only sleeping about 2 hrs per night.  Pt has tried Ibuprofen, Aleve and Tylenol in addition to the Gabapentin and nothing is working.

## 2018-09-17 ENCOUNTER — Telehealth: Payer: Self-pay | Admitting: Family Medicine

## 2018-09-17 NOTE — Telephone Encounter (Signed)
Called and spoke with patient. She had MRI on Monday and has appointment with Dr. Lynann Bologna tomorrow morning. She is having more pain in arms going to her elbows and pain radiating from her back to her feet. She had PT last week. Had increased pain following therapy. Doing better with am metformin. Blood sugars running low to mid 100s. I will touch base with her after her appointment with Dr. Lynann Bologna.

## 2018-09-17 NOTE — Telephone Encounter (Signed)
[facility] on behalf of [employer] would like for you to call them regarding pt's medicine. They would like to add statin to medication to patient due to she is diabetic.    CB [phone]

## 2018-09-18 ENCOUNTER — Other Ambulatory Visit: Payer: Self-pay | Admitting: Family Medicine

## 2018-09-18 DIAGNOSIS — M791 Myalgia, unspecified site: Secondary | ICD-10-CM

## 2018-09-18 DIAGNOSIS — M5416 Radiculopathy, lumbar region: Secondary | ICD-10-CM

## 2018-09-18 MED ORDER — TRAMADOL HCL 50 MG PO TABS: 50.0000 mg | ORAL_TABLET | Freq: Three times a day (TID) | ORAL | 0 refills | Status: AC | PRN

## 2018-09-18 MED ORDER — CYCLOBENZAPRINE HCL 10 MG PO TABS: 5.0000 mg | ORAL_TABLET | Freq: Every evening | ORAL | 1 refills | Status: DC | PRN

## 2018-09-18 NOTE — Telephone Encounter (Signed)
Called and spoke with patient. Had appointment with Dr. Jonni Sanger today. Cervical MRI was negative. He is referring her to neurology. Patient was able to control pain with cyclobenzaprine and tramadol. Per patient, Dr. Jonni Sanger will not refill tramadol and she was told to get from PCP. I have sent in small supply of tramadol and cyclobenzaprine. Patient has follow up on file with me for 2 weeks.

## 2018-09-18 NOTE — Telephone Encounter (Signed)
Attempted to call patient.  No answer.

## 2018-09-30 ENCOUNTER — Other Ambulatory Visit: Payer: Self-pay

## 2018-09-30 ENCOUNTER — Ambulatory Visit: Payer: Medicare PPO | Attending: Orthopedic Surgery | Admitting: Physical Therapy

## 2018-09-30 DIAGNOSIS — M5442 Lumbago with sciatica, left side: Secondary | ICD-10-CM | POA: Insufficient documentation

## 2018-09-30 DIAGNOSIS — G8929 Other chronic pain: Secondary | ICD-10-CM | POA: Diagnosis present

## 2018-09-30 DIAGNOSIS — M5441 Lumbago with sciatica, right side: Secondary | ICD-10-CM | POA: Diagnosis present

## 2018-09-30 DIAGNOSIS — M546 Pain in thoracic spine: Secondary | ICD-10-CM | POA: Diagnosis present

## 2018-09-30 DIAGNOSIS — M6281 Muscle weakness (generalized): Secondary | ICD-10-CM | POA: Diagnosis present

## 2018-09-30 NOTE — Patient Instructions (Signed)
Head Press With [place] chin SLIGHTLY toward chest, keep mouth closed. Feel weight on back of head. Increase weight by pressing head down. Hold _5__ seconds. Relax. Repeat _10__ times. Surface: floor   Copyright  VHI. All rights reserved.  Resisted Horizontal Abduction: Bilateral  Band \\n  both hands, arms out in front, shoulder height/. Keep chin tucked and Keeping arms straight, pinch shoulder blades together and stretch arms out. Repeat __10__ times per set. Do ____ sets per session. Do ____ sessions per day.  http://orth.exer.us/969   Copyright  VHI. All rights reserved.  BACK: Child's Pose (Sciatica)    Sit in knee-chest position and reach arms forward. Separate knees for comfort. Hold position for _3__ breaths. Repeat _3__ times. Do __1_ times per day.  Copyright  VHI. All rights reserved.  Cat Back    On hands and knees, exhale and round back up. Inhale and arch back down. Hold for __1__ breaths.  http://yg.exer.us/47   Copyright  VHI. All rights reserved.

## 2018-09-30 NOTE — Therapy (Signed)
[place], [place], [address] Phone: [phone]   Fax:  [phone]  Physical Therapy Treatment  Patient Details  Name: Autumn Valenzuela MRN: [id number] Date of Birth: [birth year] Referring Provider (PT): Dr. Lynann Bologna    Encounter Date: 09/30/2018  PT End of Session - 09/30/18 1457    Visit Number  2    Number of Visits  8    Date for PT Re-Evaluation  11/04/18    Authorization Type  [employer] MCR/ MCD    PT Start Time  1450    PT Stop Time  1535    PT Time Calculation (min)  45 min    Activity Tolerance  Patient tolerated treatment well    Behavior During Therapy  [facility] for tasks assessed/performed       Past Medical History:  Diagnosis Date  . Back pain   . Bipolar disorder ([place])   . CVA (cerebral infarction)   . Depression   . Diabetes mellitus without complication ([place])   . High cholesterol   . Hypertension   . Left leg pain   . Migraine     Past Surgical History:  Procedure Laterality Date  . ANTERIOR LATERAL LUMBAR FUSION WITH PERCUTANEOUS SCREW 1 LEVEL Left 02/11/2018   Procedure: LEFT LATERAL LUMBAR  FOUR-FIVE INTERBODY FUSION WITH INSTRUMENTATION AND ALLOGRAFT;  Surgeon: Phylliss Bob, MD;  Location: [place];  Service: Orthopedics;  Laterality: Left;  LEFT LATERAL LUMBAR  FOUR-FIVE INTERBODY FUSION WITH INSTRUMENTATION AND ALLOGRAFT  . BACK SURGERY     Lumbar fusion L5-S1  . DIABETES    . EYE SURGERY Bilateral   . KNEE SURGERY    . NEUROFORAMINAL STENOSIS Left    Severe L4-L5, involving the level above previous fusion.  Marland Kitchen PARTIAL HYSTERECTOMY    . RADICULOPATHY Left    L4, Secondary to an L4-L5 Spondylolisthesis  . SHOULDER SURGERY      There were no vitals filed for this visit.  Subjective Assessment - 09/30/18 1453    Subjective  She is seeing a neurologist next month. Both feet cramp up, draw up.  Walking does not help.  Having pain in neck, arms as well as back, legs.     Currently in Pain?  Yes    Pain Score  6     Pain Location  Back    Pain Orientation  Lower;Mid;Upper    Pain Descriptors / Indicators  Other (Comment)   rubber band   Pain Type  Chronic pain    Pain Onset  More than a month ago    Pain Frequency  Constant    Aggravating Factors   standing, walking , bending, hurts more end of the day    Pain Relieving Factors  not much           [facility] Adult PT Treatment/Exercise - 09/30/18 0001      Neck Exercises: Stabilization   Stabilization  chin tuck with overhead lift with cane       Lumbar Exercises: Stretches   Single Knee to Chest Stretch  3 reps    Lower Trunk Rotation  10 seconds    Lower Trunk Rotation Limitations  x 10 , pain rotation to L , added head turns     Pelvic Tilt  10 reps      Lumbar Exercises: Supine   Straight Leg Raise  10 reps    Other Supine Lumbar Exercises  isometrice ball squeeze x 10-  Lumbar Exercises: Quadruped   [place]  10 reps    Other Quadruped Lumbar Exercises  childs pose x 30 sec x 3       Modalities   Modalities  Electrical Stimulation;Moist Heat      Moist Heat Therapy   Number Minutes Moist Heat  15 Minutes    Moist Heat Location  Lumbar Spine      Electrical Stimulation   Electrical Stimulation Location  low back     Electrical Stimulation Action  IFC     Electrical Stimulation Parameters  to tol, 15     Electrical Stimulation Goals  Pain             PT Education - 09/30/18 1532    Education Details  HEP, general mobility, IFC    Person(s) Educated  Patient    Methods  Explanation;Handout    Comprehension  Verbalized understanding       PT Short Term Goals - 09/09/18 2025      PT SHORT TERM GOAL #1   Title  Pt will be I with initial HEP    Time  3    Period  Weeks    Status  New    Target Date  09/30/18      PT SHORT TERM GOAL #2   Title  Pt will be able to show good form with squatting and lifting light items from the floor to ease strain on back     Time  3    Period  Weeks    Status  New    Target Date  09/30/18      PT SHORT TERM GOAL #3   Title  Pt will report less pain in low back with basic home tasks and holding her grandson, no more than moderate 5/10    Time  3    Period  Weeks    Status  New    Target Date  09/30/18        PT Long Term Goals - 09/09/18 2028      PT LONG TERM GOAL #1   Title  Pt will be I with HEP for back, LE strength    Time  8    Period  Weeks    Status  New    Target Date  11/04/18      PT LONG TERM GOAL #2   Title  Pt will lower FOTO score to 55% or better to show improved functional mobility.    Time  8    Period  Weeks    Status  New    Target Date  11/04/18      PT LONG TERM GOAL #3   Title  LLE strength will improve to 4+/5 or more for more stability to gait, lifting and carrying    Time  8    Period  Weeks    Status  New    Target Date  11/04/18      PT LONG TERM GOAL #4   Title  Pt will lift, carry carseat with good mechanics and pain minimal    Time  8    Period  Weeks    Status  New    Target Date  11/04/18            Plan - 09/30/18 1534    Clinical Impression Statement  Patient has not been here since eval.  Has had increased symptoms in her neck and UE, progressive weakness and  pain radiating into both arms and both legs. She did fairly well with exercises, took caution to avoid any overactivity as she tends to have increaed rebound pain at night.  Utilized TENS and MHP for soreness.    PT Treatment/Interventions  ADLs/Self Care Home Management;Manual techniques;Functional mobility training;Moist Heat;Cryotherapy;Electrical Stimulation;Therapeutic exercise;Therapeutic activities;Balance training;Patient/family education;Passive range of motion    PT Next Visit Plan  check HEP, strengthen core, trunk flexibillity, NuStep, body mechanics    PT Home Exercise Plan  posterior tilt, bridge, LTR, horiz abd yello band with chin tuck , childs pose and cat    Consulted  and Agree with Plan of Care  Patient       Patient will benefit from skilled therapeutic intervention in order to improve the following deficits and impairments:  Increased fascial restricitons, Impaired sensation, Improper body mechanics, Pain, Postural dysfunction, Increased muscle spasms, Decreased mobility, Decreased activity tolerance, Decreased range of motion, Decreased strength, Obesity, Impaired flexibility, Decreased balance, Difficulty walking  Visit Diagnosis: Chronic bilateral low back pain with bilateral sciatica  Pain in thoracic spine  Muscle weakness (generalized)     Problem List Patient Active Problem List   Diagnosis Date Noted  . Acute pain of both knees 06/18/2018  . Radiculopathy 02/11/2018  . Bipolar disorder ([place]) 03/29/2016  . Diabetic ketoacidosis ([place]) 03/24/2016  . Dermatitis 03/24/2016  . HTN (hypertension) 03/24/2016  . HLD (hyperlipidemia) 03/24/2016  . DM (diabetes mellitus) ([place]) 03/24/2016  . Hyperglycemia   . Chronic daily headache 11/23/2012  . 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 Depression 11/23/2012    Genevra Orne 09/30/2018, 3:43 PM  [facility] [address] [place], [place], [address] Phone: [phone]   Fax:  [phone]  Name: Autumn Valenzuela MRN: [id number] Date of Birth: 1960/06/26  Raeford Razor, PT 09/30/18 3:43 PM Phone: [phone] Fax: [phone]

## 2018-10-02 ENCOUNTER — Telehealth: Payer: Self-pay | Admitting: Family Medicine

## 2018-10-02 ENCOUNTER — Encounter: Payer: Medicare PPO | Admitting: Family Medicine

## 2018-10-02 NOTE — Telephone Encounter (Signed)
Patient was scheduled for annual exam today.  She did not show.  Please call and check on her and offer to reschedule her appointment.

## 2018-10-05 NOTE — Telephone Encounter (Signed)
Patient did not realize she had an appointment on 10/02/2018, states she did not get the reminder. Rescheduled CPE for 10/07/2018

## 2018-10-05 NOTE — Telephone Encounter (Signed)
Noted  

## 2018-10-07 ENCOUNTER — Ambulatory Visit: Payer: Medicare PPO | Admitting: Physical Therapy

## 2018-10-07 ENCOUNTER — Encounter: Payer: Self-pay | Admitting: Family Medicine

## 2018-10-07 ENCOUNTER — Other Ambulatory Visit: Payer: Self-pay

## 2018-10-07 ENCOUNTER — Ambulatory Visit (INDEPENDENT_AMBULATORY_CARE_PROVIDER_SITE_OTHER): Payer: Medicare PPO | Admitting: Family Medicine

## 2018-10-07 ENCOUNTER — Encounter: Payer: Self-pay | Admitting: Physical Therapy

## 2018-10-07 VITALS — BP 118/72 | HR 84 | Temp 98.7°F | Ht 61.25 in | Wt 224.0 lb

## 2018-10-07 DIAGNOSIS — M545 Low back pain, unspecified: Secondary | ICD-10-CM

## 2018-10-07 DIAGNOSIS — M546 Pain in thoracic spine: Secondary | ICD-10-CM

## 2018-10-07 DIAGNOSIS — Z794 Long term (current) use of insulin: Secondary | ICD-10-CM

## 2018-10-07 DIAGNOSIS — I10 Essential (primary) hypertension: Secondary | ICD-10-CM | POA: Diagnosis not present

## 2018-10-07 DIAGNOSIS — Z0000 Encounter for general adult medical examination without abnormal findings: Secondary | ICD-10-CM

## 2018-10-07 DIAGNOSIS — E785 Hyperlipidemia, unspecified: Secondary | ICD-10-CM | POA: Diagnosis not present

## 2018-10-07 DIAGNOSIS — E1159 Type 2 diabetes mellitus with other circulatory complications: Secondary | ICD-10-CM

## 2018-10-07 DIAGNOSIS — M6281 Muscle weakness (generalized): Secondary | ICD-10-CM

## 2018-10-07 DIAGNOSIS — M5416 Radiculopathy, lumbar region: Secondary | ICD-10-CM

## 2018-10-07 DIAGNOSIS — M25561 Pain in right knee: Secondary | ICD-10-CM

## 2018-10-07 DIAGNOSIS — M79672 Pain in left foot: Secondary | ICD-10-CM

## 2018-10-07 DIAGNOSIS — M5442 Lumbago with sciatica, left side: Secondary | ICD-10-CM | POA: Diagnosis not present

## 2018-10-07 DIAGNOSIS — G8929 Other chronic pain: Secondary | ICD-10-CM

## 2018-10-07 DIAGNOSIS — Z1211 Encounter for screening for malignant neoplasm of colon: Secondary | ICD-10-CM

## 2018-10-07 DIAGNOSIS — R252 Cramp and spasm: Secondary | ICD-10-CM

## 2018-10-07 DIAGNOSIS — M25562 Pain in left knee: Secondary | ICD-10-CM

## 2018-10-07 DIAGNOSIS — M79671 Pain in right foot: Secondary | ICD-10-CM

## 2018-10-07 DIAGNOSIS — Z1239 Encounter for other screening for malignant neoplasm of breast: Secondary | ICD-10-CM

## 2018-10-07 LAB — CBC WITH DIFFERENTIAL/PLATELET
Basophils Absolute: 0 10*3/uL (ref 0.0–0.1)
Basophils Relative: 0.3 % (ref 0.0–3.0)
Eosinophils Absolute: 0.5 10*3/uL (ref 0.0–0.7)
Eosinophils Relative: 4.3 % (ref 0.0–5.0)
HCT: 39.3 % (ref 36.0–46.0)
Hemoglobin: 12.5 g/dL (ref 12.0–15.0)
Lymphocytes Relative: 32.8 % (ref 12.0–46.0)
Lymphs Abs: 3.9 10*3/uL (ref 0.7–4.0)
MCHC: 31.8 g/dL (ref 30.0–36.0)
MCV: 87.7 fl (ref 78.0–100.0)
Monocytes Absolute: 0.7 10*3/uL (ref 0.1–1.0)
Monocytes Relative: 5.9 % (ref 3.0–12.0)
Neutro Abs: 6.8 10*3/uL (ref 1.4–7.7)
Neutrophils Relative %: 56.7 % (ref 43.0–77.0)
Platelets: 231 10*3/uL (ref 150.0–400.0)
RBC: 4.48 Mil/uL (ref 3.87–5.11)
RDW: 16.4 % — ABNORMAL HIGH (ref 11.5–15.5)
WBC: 11.9 10*3/uL — ABNORMAL HIGH (ref 4.0–10.5)

## 2018-10-07 LAB — LIPID PANEL
Cholesterol: 202 mg/dL — ABNORMAL HIGH (ref 0–200)
HDL: 43.8 mg/dL (ref >39.00)
LDL Cholesterol: 133 mg/dL — ABNORMAL HIGH (ref 0–99)
NonHDL: 158.37
Total CHOL/HDL Ratio: 5
Triglycerides: 128 mg/dL (ref 0.0–149.0)
VLDL: 25.6 mg/dL (ref 0.0–40.0)

## 2018-10-07 LAB — BASIC METABOLIC PANEL
BUN: 12 mg/dL (ref 6–23)
CO2: 28 mEq/L (ref 19–32)
Calcium: 9.1 mg/dL (ref 8.4–10.5)
Chloride: 100 mEq/L (ref 96–112)
Creatinine, Ser: 0.62 mg/dL (ref 0.40–1.20)
GFR: 120.45 mL/min (ref >60.00)
Glucose, Bld: 229 mg/dL — ABNORMAL HIGH (ref 70–99)
Potassium: 4.8 mEq/L (ref 3.5–5.1)
Sodium: 136 mEq/L (ref 135–145)

## 2018-10-07 LAB — MAGNESIUM: Magnesium: 1.9 mg/dL (ref 1.5–2.5)

## 2018-10-07 LAB — HEMOGLOBIN A1C: Hgb A1c MFr Bld: 8.7 % — ABNORMAL HIGH (ref 4.6–6.5)

## 2018-10-07 LAB — MICROALBUMIN / CREATININE URINE RATIO
Creatinine,U: 200.2 mg/dL
Microalb Creat Ratio: 0.6 mg/g (ref 0.0–30.0)
Microalb, Ur: 1.3 mg/dL (ref 0.0–1.9)

## 2018-10-07 MED ORDER — TRAMADOL HCL 50 MG PO TABS: 50.0000 mg | ORAL_TABLET | Freq: Three times a day (TID) | ORAL | 0 refills | Status: AC | PRN

## 2018-10-07 NOTE — Patient Instructions (Addendum)
Good to see you today  Increase your gabapentin to twice a day- should help with headaches, pain, hot flashes  You will get a call about getting your mammogram and colonoscopy scheduled  I have put in a referral to see the podiatrist about your feet  We will determine follow up based on your labs   Health Maintenance for Postmenopausal Women Menopause is a normal process in which your ability to get pregnant comes to an end. This process happens slowly over many months or years, usually between the ages of 7 and 22. Menopause is complete when you have missed your menstrual periods for 12 months. It is important to talk with your health care provider about some of the most common conditions that affect women after menopause (postmenopausal women). These include heart disease, cancer, and bone loss (osteoporosis). Adopting a healthy lifestyle and getting preventive care can help to promote your health and wellness. The actions you take can also lower your chances of developing some of these common conditions. What should I know about menopause? During menopause, you may get a number of symptoms, such as:  Hot flashes. These can be moderate or severe.  Night sweats.  Decrease in sex drive.  Mood swings.  Headaches.  Tiredness.  Irritability.  Memory problems.  Insomnia. Choosing to treat or not to treat these symptoms is a decision that you make with your health care provider. Do I need hormone replacement therapy?  Hormone replacement therapy is effective in treating symptoms that are caused by menopause, such as hot flashes and night sweats.  Hormone replacement carries certain risks, especially as you become older. If you are thinking about using estrogen or estrogen with progestin, discuss the benefits and risks with your health care provider. What is my risk for heart disease and stroke? The risk of heart disease, heart attack, and stroke increases as you age. One of the  causes may be a change in the body's hormones during menopause. This can affect how your body uses dietary fats, triglycerides, and cholesterol. Heart attack and stroke are medical emergencies. There are many things that you can do to help prevent heart disease and stroke. Watch your blood pressure  High blood pressure causes heart disease and increases the risk of stroke. This is more likely to develop in people who have high blood pressure readings, are of African descent, or are overweight.  Have your blood pressure checked: ? Every 3-5 years if you are 2-15 years of age. ? Every year if you are 20 years old or older. Eat a healthy diet   Eat a diet that includes plenty of vegetables, fruits, low-fat dairy products, and lean protein.  Do not eat a lot of foods that are high in solid fats, added sugars, or sodium. Get regular exercise Get regular exercise. This is one of the most important things you can do for your health. Most adults should:  Try to exercise for at least 150 minutes each week. The exercise should increase your heart rate and make you sweat (moderate-intensity exercise).  Try to do strengthening exercises at least twice each week. Do these in addition to the moderate-intensity exercise.  Spend less time sitting. Even light physical activity can be beneficial. Other tips  Work with your health care provider to achieve or maintain a healthy weight.  Do not use any products that contain nicotine or tobacco, such as cigarettes, e-cigarettes, and chewing tobacco. If you need help quitting, ask your health care provider.  Know your numbers. Ask your health care provider to check your cholesterol and your blood sugar (glucose). Continue to have your blood tested as directed by your health care provider. Do I need screening for cancer? Depending on your health history and family history, you may need to have cancer screening at different stages of your life. This may  include screening for:  Breast cancer.  Cervical cancer.  Lung cancer.  Colorectal cancer. What is my risk for osteoporosis? After menopause, you may be at increased risk for osteoporosis. Osteoporosis is a condition in which bone destruction happens more quickly than new bone creation. To help prevent osteoporosis or the bone fractures that can happen because of osteoporosis, you may take the following actions:  If you are 52-5 years old, get at least 1,000 mg of calcium and at least 600 mg of vitamin D per day.  If you are older than age 61 but younger than age 9, get at least 1,200 mg of calcium and at least 600 mg of vitamin D per day.  If you are older than age 23, get at least 1,200 mg of calcium and at least 800 mg of vitamin D per day. Smoking and drinking excessive alcohol increase the risk of osteoporosis. Eat foods that are rich in calcium and vitamin D, and do weight-bearing exercises several times each week as directed by your health care provider. How does menopause affect my mental health? Depression may occur at any age, but it is more common as you become older. Common symptoms of depression include:  Low or sad mood.  Changes in sleep patterns.  Changes in appetite or eating patterns.  Feeling an overall lack of motivation or enjoyment of activities that you previously enjoyed.  Frequent crying spells. Talk with your health care provider if you think that you are experiencing depression. General instructions See your health care provider for regular wellness exams and vaccines. This may include:  Scheduling regular health, dental, and eye exams.  Getting and maintaining your vaccines. These include: ? Influenza vaccine. Get this vaccine each year before the flu season begins. ? Pneumonia vaccine. ? Shingles vaccine. ? Tetanus, diphtheria, and pertussis (Tdap) booster vaccine. Your health care provider may also recommend other immunizations. Tell your  health care provider if you have ever been abused or do not feel safe at home. Summary  Menopause is a normal process in which your ability to get pregnant comes to an end.  This condition causes hot flashes, night sweats, decreased interest in sex, mood swings, headaches, or lack of sleep.  Treatment for this condition may include hormone replacement therapy.  Take actions to keep yourself healthy, including exercising regularly, eating a healthy diet, watching your weight, and checking your blood pressure and blood sugar levels.  Get screened for cancer and depression. Make sure that you are up to date with all your vaccines. This information is not intended to replace advice given to you by your health care provider. Make sure you discuss any questions you have with your health care provider. Document Released: 02/22/2005 Document Revised: 12/24/2017 Document Reviewed: 12/24/2017 Elsevier Patient Education  2020 [employer].

## 2018-10-07 NOTE — Progress Notes (Signed)
Established Patient Office Visit  Subjective:  Patient ID: Autumn Valenzuela, female    DOB: 26-Mar-1960  Age: 58 y.o. MRN: [id number]  CC:  Chief Complaint  Patient presents with  . Annual Exam    HPI Autumn Valenzuela presents for her annual physical exam.  Chronic pain- Pt continues to have chronic intermittent neck, back, shoulders , bilateral legs pain. Pain is described as throbbing, aching. Pt takes   tramadol for pain as needed with moderate relief. Pt continues to attend  physical therapy. Pt has an appointment with her neurologist in a couple weeks.    DM2- Pt continues to take Lantus 10 units and Metformin 500 mg. Pt reports blood sugars under 200 and no signs and symptoms of hypoglycemia. Pt still reports occasional diarrhea issue with Metformin.  Hot flushes-  pt is currently on gabapentin and report decrease in frequency of hot flushes.   Pt reports occasional lightheadedness and dizziness with change of position. It happens during morning when she gets up and occasionally during the daytime when she is doing household chores.   Past Medical History:  Diagnosis Date  . Back pain   . Bipolar disorder ([place])   . CVA (cerebral infarction)   . Depression   . Diabetes mellitus without complication ([place])   . High cholesterol   . Hypertension   . Left leg pain   . Migraine     Past Surgical History:  Procedure Laterality Date  . ANTERIOR LATERAL LUMBAR FUSION WITH PERCUTANEOUS SCREW 1 LEVEL Left 02/11/2018   Procedure: LEFT LATERAL LUMBAR  FOUR-FIVE INTERBODY FUSION WITH INSTRUMENTATION AND ALLOGRAFT;  Surgeon: Phylliss Bob, MD;  Location: [facility];  Service: Orthopedics;  Laterality: Left;  LEFT LATERAL LUMBAR  FOUR-FIVE INTERBODY FUSION WITH INSTRUMENTATION AND ALLOGRAFT  . BACK SURGERY     Lumbar fusion L5-S1  . DIABETES    . EYE SURGERY Bilateral   . KNEE SURGERY    . NEUROFORAMINAL STENOSIS Left    Severe L4-L5, involving the level above previous fusion.  Marland Kitchen  PARTIAL HYSTERECTOMY    . RADICULOPATHY Left    L4, Secondary to an L4-L5 Spondylolisthesis  . SHOULDER SURGERY      Family History  Problem Relation Age of Onset  . Diabetes Mother   . Stroke Father   . Cancer Paternal Aunt   . Cancer Paternal Aunt   . Diabetes Brother     Social History   Socioeconomic History  . Marital status: Single    Spouse name: Not on file  . Number of children: 3  . Years of education: 29  . Highest education level: Not on file  Occupational History  . Not on file  Social Needs  . Financial resource strain: Not on file  . Food insecurity    Worry: Not on file    Inability: Not on file  . Transportation needs    Medical: Not on file    Non-medical: Not on file  Tobacco Use  . Smoking status: Former Smoker    Quit date: 12/13/2015    Years since quitting: 2.8  . Smokeless tobacco: Never Used  Substance and Sexual Activity  . Alcohol use: No  . Drug use: No    Types: Marijuana    Comment: occ - states stopped smokin weed 04/2014  . Sexual activity: Yes    Birth control/protection: None  Lifestyle  . Physical activity    Days per week: Not on file  Minutes per session: Not on file  . Stress: Not on file  Relationships  . Social Herbalist on phone: Not on file    Gets together: Not on file    Attends religious service: Not on file    Active member of club or organization: Not on file    Attends meetings of clubs or organizations: Not on file    Relationship status: Not on file  . Intimate partner violence    Fear of current or ex partner: Not on file    Emotionally abused: Not on file    Physically abused: Not on file    Forced sexual activity: Not on file  Other Topics Concern  . Not on file  Social History Narrative   Patient is single with 3 children.   Patient is right handed.   Patient has a high school education.   Patient drinks 3 cups daily.    Outpatient Medications Prior to Visit  Medication Sig  Dispense Refill  . Accu-Chek FastClix Lancets MISC TEST 4 TIMES DAILY. DX E11.9 306 each 3  . albuterol (PROVENTIL HFA;VENTOLIN HFA) 108 (90 Base) MCG/ACT inhaler Inhale 2 puffs into the lungs daily as needed for wheezing or shortness of breath. 18 g 2  . albuterol (PROVENTIL) (2.5 MG/3ML) 0.083% nebulizer solution Take 2.5 mg by nebulization at bedtime as needed for wheezing.    Marland Kitchen ALPRAZolam (XANAX) 0.5 MG tablet Take 1 tablet (0.5 mg total) by mouth daily as needed for anxiety. 30 tablet 2  . amitriptyline (ELAVIL) 25 MG tablet TAKE 1 TABLET(25 MG) BY MOUTH AT BEDTIME 90 tablet 1  . Blood Glucose Calibration (ACCU-CHEK GUIDE CONTROL) LIQD 1 each by In Vitro route every 30 (thirty) days. Needs Accu-check guide monitor system. DX. E11.9 3 each 3  . calcium-vitamin D (OSCAL WITH D) 500-200 MG-UNIT per tablet Take 1 tablet by mouth daily with breakfast.    . cyclobenzaprine (FLEXERIL) 10 MG tablet Take 0.5-1 tablets (5-10 mg total) by mouth at bedtime as needed for muscle spasms. 30 tablet 1  . DULoxetine (CYMBALTA) 60 MG capsule Take 1 capsule (60 mg total) by mouth at bedtime. 90 capsule 1  . fluticasone (FLONASE) 50 MCG/ACT nasal spray Place 2 sprays into both nostrils daily. (Patient taking differently: Place 2 sprays into both nostrils daily as needed for allergies. ) 16 g 0  . gabapentin (NEURONTIN) 300 MG capsule Take one capsule at bedtime for 7 days then increase to twice a day (for first fill only) 60 capsule 1  . glucose blood (ACCU-CHEK GUIDE) test strip Use to test blood sugar 4 times a day.  Dx: E11.9 400 each 3  . Insulin Glargine (LANTUS) 100 UNIT/ML Solostar Pen Inject 10 Units into the skin daily. If blood sugar over 200, increase by 2 units up to 40 units. 15 mL 1  . Insulin Pen Needle 31G X 5 MM MISC 1 Units by Does not apply route at bedtime. 90 each 3  . ketorolac (ACULAR) 0.4 % SOLN Place 1 drop into both eyes daily as needed (eye irritation).     Marland Kitchen levocetirizine (XYZAL) 5 MG  tablet Take 1 tablet (5 mg total) by mouth every evening. 30 tablet 2  . loratadine (CLARITIN) 10 MG tablet Take 1 tablet (10 mg total) by mouth daily. (Patient taking differently: Take 10 mg by mouth daily as needed for allergies. ) 30 tablet 0  . meloxicam (MOBIC) 15 MG tablet One tablet every 24  hours as needed for inflammation. 90 tablet 0  . metFORMIN (GLUCOPHAGE-XR) 500 MG 24 hr tablet Take 1 tablet (500 mg total) by mouth 2 (two) times daily with a meal. 180 tablet 1  . ondansetron (ZOFRAN-ODT) 8 MG disintegrating tablet Take 1 tablet (8 mg total) by mouth every 8 (eight) hours as needed for nausea. 20 tablet 0  . topiramate (TOPAMAX) 100 MG tablet Take 1 tablet (100 mg total) by mouth 2 (two) times daily. 60 tablet 2  . triamcinolone ointment (KENALOG) 0.1 % Apply topically 2 (two) times daily. For no more than 10 days. 30 g 1  . Vitamin D, Ergocalciferol, (DRISDOL) 50000 units CAPS capsule Take 1 capsule (50,000 Units total) by mouth every 7 (seven) days. 12 capsule 3   No facility-administered medications prior to visit.     Allergies  Allergen Reactions  . Cefazolin Swelling and Other (See Comments)    Rapid iv infusion caused swelling and burning at the iv site    ROS Review of Systems  Constitutional: Negative for activity change, appetite change, fatigue and fever.  HENT: Negative for congestion, ear pain, mouth sores, sinus pressure, sinus pain and sore throat.   Eyes: Negative for pain, discharge and redness.  Respiratory: Negative for cough, chest tightness, shortness of breath and wheezing.   Cardiovascular: Negative for chest pain, palpitations and leg swelling.  Gastrointestinal: Positive for nausea. Negative for abdominal pain, blood in stool, constipation, diarrhea and vomiting.       Occasionally  Genitourinary: Negative for difficulty urinating, dysuria, frequency and urgency.  Musculoskeletal: Positive for back pain and neck pain.  Skin: Negative for color  change, pallor, rash and wound.  Neurological: Positive for dizziness and light-headedness. Negative for syncope and speech difficulty.       With change of positions  Hematological: Negative for adenopathy.  Psychiatric/Behavioral: Positive for sleep disturbance.       Due to chronic pain      Objective:    Physical Exam  Constitutional: She is oriented to person, place, and time. She appears well-developed and well-nourished.  HENT:  Head: Normocephalic and atraumatic.  Right Ear: External ear normal.  Left Ear: External ear normal.  Nose: Nose normal.  Mouth/Throat: Oropharynx is clear and moist. No oropharyngeal exudate.  Eyes: Pupils are equal, round, and reactive to light. Conjunctivae and EOM are normal. Right eye exhibits no discharge. Left eye exhibits no discharge. No scleral icterus.  Cardiovascular: Normal rate and regular rhythm. Exam reveals no gallop and no friction rub.  No murmur heard. Pulmonary/Chest: Effort normal and breath sounds normal. No respiratory distress. She has no wheezes. She has no rales. Right breast exhibits no inverted nipple, no mass, no nipple discharge, no skin change and no tenderness. Left breast exhibits no inverted nipple, no mass, no nipple discharge, no skin change and no tenderness. Breasts are symmetrical.  Abdominal: Soft. Bowel sounds are normal.  Musculoskeletal:        General: No deformity or edema.  Lymphadenopathy:    She has no cervical adenopathy.  Neurological: She is alert and oriented to person, place, and time.  Skin: Skin is warm and dry.  Psychiatric: She has a normal mood and affect. Her behavior is normal. Thought content normal.  Nursing note and vitals reviewed.   BP 118/72   Pulse 84   Temp 98.7 F (37.1 C)   Ht 5' 1.25" (1.556 m)   Wt 101.6 kg   SpO2 97%   BMI 41.98  kg/m  Wt Readings from Last 3 Encounters:  10/07/18 101.6 kg  08/31/18 98 kg  08/25/18 99.3 kg     Health Maintenance Due  Topic Date  Due  . TETANUS/TDAP  09/06/1981  . MAMMOGRAM  11/20/2015  . URINE MICROALBUMIN  05/01/2018  . OPHTHALMOLOGY EXAM  06/15/2018  . FOOT EXAM  08/26/2018    There are no preventive care reminders to display for this patient.  Lab Results  Component Value Date   TSH 0.43 04/30/2017   Lab Results  Component Value Date   WBC 13.3 (H) 02/09/2018   HGB 11.8 (L) 02/09/2018   HCT 38.4 02/09/2018   MCV 88.9 02/09/2018   PLT 238 02/09/2018   Lab Results  Component Value Date   NA 139 02/09/2018   K 3.9 02/09/2018   CO2 25 02/09/2018   GLUCOSE 155 (H) 02/09/2018   BUN 13 02/09/2018   CREATININE 0.76 02/09/2018   BILITOT 0.3 02/09/2018   ALKPHOS 58 02/09/2018   AST 14 (L) 02/09/2018   ALT 15 02/09/2018   PROT 6.8 02/09/2018   ALBUMIN 3.7 02/09/2018   CALCIUM 8.9 02/09/2018   ANIONGAP 8 02/09/2018   GFR 133.66 04/30/2017   Lab Results  Component Value Date   CHOL  04/13/2007    100        ATP III CLASSIFICATION:  <200     mg/dL   Desirable  200-239  mg/dL   Borderline High  >=240    mg/dL   High   Lab Results  Component Value Date   HDL 47 04/13/2007   Lab Results  Component Value Date   LDLCALC  04/13/2007    45        Total Cholesterol/HDL:CHD Risk Coronary Heart Disease Risk Table                     Men   Women  1/2 Average Risk   3.4   3.3   Lab Results  Component Value Date   TRIG 38 04/13/2007   Lab Results  Component Value Date   CHOLHDL 2.1 04/13/2007   Lab Results  Component Value Date   HGBA1C 12.2 (A) 06/22/2018      Assessment & Plan:   1. Type 2 diabetes mellitus with other circulatory complication, with long-term current use of insulin (HCC) - Hemoglobin A1c - Microalbumin/Creatinine Ratio, Urine  2. Essential hypertension Stable, continue with current treatment - Hemoglobin [id number] - Basic Metabolic Panel - CBC with Differential  3. Hyperlipidemia, unspecified hyperlipidemia type - Hemoglobin A1c - Lipid Panel  4. Muscle cramps  Night muscle cramps - Magnesium  5. Screening for colon cancer Recommended screening - Ambulatory referral to Gastroenterology  6. Heel pain, bilateral - Ambulatory referral to Podiatry  Problem List Items Addressed This Visit    None      No orders of the defined types were placed in this encounter.   Follow-up: No follow-ups on file.    Rica Koyanagi, RN

## 2018-10-07 NOTE — Telephone Encounter (Signed)
Patient in office for CPE, will check lipids, cmet and start statin.

## 2018-10-07 NOTE — Therapy (Signed)
[facility] [address] [place], [place], [address] Phone: [phone]   Fax:  [phone]  Physical Therapy Treatment  Patient Details  Name: Autumn Valenzuela MRN: [id number] Date of Birth: 11/06/1960 Referring Provider (PT): Dr. Dumonski    Encounter Date: 10/07/2018  PT End of Session - 10/07/18 1238    Visit Number  3    Number of Visits  8    Date for PT Re-Evaluation  11/04/18    Authorization Type  [employer] MCR/ MCD    PT Start Time  1232    Activity Tolerance  Patient tolerated treatment well    Behavior During Therapy  WFL for tasks assessed/performed       Past Medical History:  Diagnosis Date  . Back pain   . Bipolar disorder (HCC)   . CVA (cerebral infarction)   . Depression   . Diabetes mellitus without complication (HCC)   . High cholesterol   . Hypertension   . Left leg pain   . Migraine     Past Surgical History:  Procedure Laterality Date  . ANTERIOR LATERAL LUMBAR FUSION WITH PERCUTANEOUS SCREW 1 LEVEL Left 02/11/2018   Procedure: LEFT LATERAL LUMBAR  FOUR-FIVE INTERBODY FUSION WITH INSTRUMENTATION AND ALLOGRAFT;  Surgeon: Dumonski, Mark, MD;  Location: [facility] OR;  Service: Orthopedics;  Laterality: Left;  LEFT LATERAL LUMBAR  FOUR-FIVE INTERBODY FUSION WITH INSTRUMENTATION AND ALLOGRAFT  . BACK SURGERY     Lumbar fusion L5-S1  . DIABETES    . EYE SURGERY Bilateral   . KNEE SURGERY    . NEUROFORAMINAL STENOSIS Left    Severe L4-L5, involving the level above previous fusion.  . PARTIAL HYSTERECTOMY    . RADICULOPATHY Left    L4, Secondary to an L4-L5 Spondylolisthesis  . SHOULDER SURGERY      There were no vitals filed for this visit.  Subjective Assessment - 10/07/18 1236    Subjective  Just got back from her MD.  Got a headache today. Back is "so-so".  Still having those leg pain, aching pain, sometimes it feel like needles sticking.    Currently in Pain?  Yes    Pain Score  --   did not  rate   Pain Location  Back    Pain Descriptors / Indicators  Aching    Pain Type  Chronic pain    Pain Onset  More than a month ago    Pain Frequency  Constant    Aggravating Factors   stand, walking, bending    Pain Relieving Factors  exercises help some.  I dont do them as long              [facility] Adult PT Treatment/Exercise - 10/07/18 0001      Self-Care   Self-Care  ADL's;Lifting;Posture    ADL's  handouts    Lifting  1 yr old grandson       Lumbar Exercises: Stretches   Lower Trunk Rotation  10 seconds    Lower Trunk Rotation Limitations  x 5     Pelvic Tilt  10 reps    Piriformis Stretch  2 reps    Piriformis Stretch Limitations  push knee away L side more restricted     Figure 4 Stretch  2 reps      Lumbar Exercises: Supine   Pelvic Tilt  10 reps    Clam  10 reps    Clam Limitations  green band     Bent   Knee Raise  10 reps    Bent Knee Raise Limitations  green band     Bridge  10 reps    Bridge Limitations  green band                PT Short Term Goals - 10/07/18 1255      PT SHORT TERM GOAL #1   Title  Pt will be I with initial HEP    Status  Partially Met      PT SHORT TERM GOAL #2   Title  Pt will be able to show good form with squatting and lifting light items from the floor to ease strain on back    Status  On-going      PT SHORT TERM GOAL #3   Title  Pt will report less pain in low back with basic home tasks and holding her grandson, no more than moderate 5/10    Status  On-going        PT Long Term Goals - 09/09/18 2028      PT LONG TERM GOAL #1   Title  Pt will be I with HEP for back, LE strength    Time  8    Period  Weeks    Status  New    Target Date  11/04/18      PT LONG TERM GOAL #2   Title  Pt will lower FOTO score to 55% or better to show improved functional mobility.    Time  8    Period  Weeks    Status  New    Target Date  11/04/18      PT LONG TERM GOAL #3   Title  LLE strength will improve to 4+/5 or more  for more stability to gait, lifting and carrying    Time  8    Period  Weeks    Status  New    Target Date  11/04/18      PT LONG TERM GOAL #4   Title  Pt will lift, carry carseat with good mechanics and pain minimal    Time  8    Period  Weeks    Status  New    Target Date  11/04/18            Plan - 10/07/18 1238    Clinical Impression Statement  Pt with headache today, exertion increased her pain today but she was able to do mat strengthening exercises without difficulty. Ended session early due to headache.    PT Treatment/Interventions  ADLs/Self Care Home Management;Manual techniques;Functional mobility training;Moist Heat;Cryotherapy;Electrical Stimulation;Therapeutic exercise;Therapeutic activities;Balance training;Patient/family education;Passive range of motion    PT Next Visit Plan  check HEP, strengthen core, trunk flexibillity, NuStep, body mechanics    PT Home Exercise Plan  posterior tilt, bridge, LTR, horiz abd yello band with chin tuck , childs pose and cat    Consulted and Agree with Plan of Care  Patient       Patient will benefit from skilled therapeutic intervention in order to improve the following deficits and impairments:  Increased fascial restricitons, Impaired sensation, Improper body mechanics, Pain, Postural dysfunction, Increased muscle spasms, Decreased mobility, Decreased activity tolerance, Decreased range of motion, Decreased strength, Obesity, Impaired flexibility, Decreased balance, Difficulty walking  Visit Diagnosis: Chronic bilateral low back pain with bilateral sciatica  Pain in thoracic spine  Muscle weakness (generalized)     Problem List Patient Active Problem List   Diagnosis Date Noted  .  Acute pain of both knees 06/18/2018  . Radiculopathy 02/11/2018  . Bipolar disorder (HCC) 03/29/2016  . Diabetic ketoacidosis (HCC) 03/24/2016  . Dermatitis 03/24/2016  . HTN (hypertension) 03/24/2016  . HLD (hyperlipidemia) 03/24/2016   . DM (diabetes mellitus) (HCC) 03/24/2016  . Hyperglycemia   . Chronic daily headache 11/23/2012  . 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 Depression 11/23/2012    PAA,JENNIFER 10/07/2018, 1:15 PM  [facility] [address] [place], [place], [address] Phone: [phone]   Fax:  [phone]  Name: Mayela Denise Friend MRN: [id number] Date of Birth: 06/21/1960  Jennifer Paa, PT 10/07/18 1:15 PM Phone: [phone] Fax: [phone]   

## 2018-10-07 NOTE — Progress Notes (Signed)
Subjective:    Patient ID: Autumn Valenzuela, female    DOB: July 06, 1960, 58 y.o.   MRN: [id number]  HPI This is a 58 yo female who presents today for CPE.   Last CPE- last year Mammo- over due, agrees to schedule Pap- unsure when last, ? gyn Colonoscopy- over due, last 2008, agrees to referral.  Tdap- unsure, not covered by insurance Flu- does not want today, prefers to get at her local pharmacy Eye- overdue, wants to see prior provider, will schedule Exercise-limited by back, neck and knee pain as well as weakness and fear of falling. Has PT scheduled.   DM- under 200, has started glucophage XR with ? Improvement with diarrhea. Taking 10 units insulin glargine.   Hot flashes- improved with gabapentin. Is taking once a day, never increased to BID as advised.   Pain- continued back, shoulders, knees, headaches. Has upcoming neurology appointment  Past Medical History:  Diagnosis Date  . Back pain   . Bipolar disorder ([place])   . CVA (cerebral infarction)   . Depression   . Diabetes mellitus without complication ([place] [facility])   . High cholesterol   . Hypertension   . Left leg pain   . Migraine    Past Surgical History:  Procedure Laterality Date  . ANTERIOR LATERAL LUMBAR FUSION WITH PERCUTANEOUS SCREW 1 LEVEL Left 02/11/2018   Procedure: LEFT LATERAL LUMBAR  FOUR-FIVE INTERBODY FUSION WITH INSTRUMENTATION AND ALLOGRAFT;  Surgeon: Phylliss Bob, MD;  Location: [facility];  Service: Orthopedics;  Laterality: Left;  LEFT LATERAL LUMBAR  FOUR-FIVE INTERBODY FUSION WITH INSTRUMENTATION AND ALLOGRAFT  . BACK SURGERY     Lumbar fusion L5-S1  . DIABETES    . EYE SURGERY Bilateral   . KNEE SURGERY    . NEUROFORAMINAL STENOSIS Left    Severe L4-L5, involving the level above previous fusion.  Marland Kitchen PARTIAL HYSTERECTOMY    . RADICULOPATHY Left    L4, Secondary to an L4-L5 Spondylolisthesis  . SHOULDER SURGERY     Family History  Problem Relation Age of Onset  . Diabetes Mother   . Stroke  Father   . Cancer Paternal Aunt   . Cancer Paternal Aunt   . Diabetes Brother    Social History   Tobacco Use  . Smoking status: Former Smoker    Quit date: 12/13/2015    Years since quitting: 2.8  . Smokeless tobacco: Never Used  Substance Use Topics  . Alcohol use: No  . Drug use: No    Types: Marijuana    Comment: occ - states stopped smokin weed 04/2014      Review of Systems  Constitutional: Negative.   HENT: Negative.   Eyes: Negative.   Respiratory: Negative.   Cardiovascular: Negative.   Gastrointestinal: Positive for diarrhea (occasional with metformin) and nausea.  Endocrine: Negative.   Genitourinary: Negative.   Musculoskeletal: Positive for arthralgias, back pain, joint swelling and neck pain.  Skin: Negative.   Psychiatric/Behavioral: Positive for sleep disturbance (due to pain). Negative for dysphoric mood. The patient is not nervous/anxious.        Objective:   Physical Exam Constitutional:      General: She is not in acute distress.    Appearance: Normal appearance. She is obese. She is not ill-appearing, toxic-appearing or diaphoretic.  HENT:     Head: Normocephalic and atraumatic.     Right Ear: External ear normal.     Left Ear: External ear normal.  Eyes:     Conjunctiva/sclera:  Conjunctivae normal.  Neck:     Musculoskeletal: Normal range of motion and neck supple.  Cardiovascular:     Rate and Rhythm: Normal rate and regular rhythm.     Heart sounds: Normal heart sounds.  Pulmonary:     Effort: Pulmonary effort is normal.     Breath sounds: Normal breath sounds.  Chest:     Breasts: Breasts are symmetrical.        Right: Normal.        Left: Normal.  Musculoskeletal:     Right lower leg: No edema.     Left lower leg: No edema.  Lymphadenopathy:     Upper Body:     Right upper body: No supraclavicular, axillary or pectoral adenopathy.     Left upper body: No supraclavicular, axillary or pectoral adenopathy.  Skin:    General:  Skin is warm and dry.  Neurological:     Mental Status: She is alert and oriented to person, place, and time.  Psychiatric:        Mood and Affect: Mood normal.        Behavior: Behavior normal.        Thought Content: Thought content normal.        Judgment: Judgment normal.       BP 118/72   Pulse 84   Temp 98.7 F (37.1 C)   Ht 5' 1.25" (1.556 m)   Wt 224 lb (101.6 kg)   SpO2 97%   BMI 41.98 kg/m  Wt Readings from Last 3 Encounters:  10/07/18 224 lb (101.6 kg)  08/31/18 216 lb 1.9 oz (98 kg)  08/25/18 218 lb 14.4 oz (99.3 kg)   Depression screen [facility] 2/9 08/21/2018 08/25/2017 08/18/2017 04/22/2016 03/27/2016  Decreased Interest 0 0 0 0 1  Down, Depressed, Hopeless 1 1 0 0 1  PHQ - 2 Score 1 1 0 0 2  Altered sleeping - 2 0 - 3  Tired, decreased energy - 3 0 - 3  Change in appetite - 1 0 - 3  Feeling bad or failure about yourself  - 3 0 - 3  Trouble concentrating - 1 0 - 3  Moving slowly or fidgety/restless - 1 0 - 3  Suicidal thoughts - 1 0 - 1  PHQ-9 Score - 13 0 - 21  Difficult doing work/chores - - Not difficult at all - Somewhat difficult        Assessment & Plan:  1. Annual physical exam - Discussed and encouraged healthy lifestyle choices- adequate sleep, regular exercise, stress management and healthy food choices.  - Reviewed overdue health maintenance, patient has been having multiple acute issues and is now prepared to complete recommended screenings.  2. Type 2 diabetes mellitus with other circulatory complication, with long-term current use of insulin (HCC) - Hemoglobin A1c - Microalbumin/Creatinine Ratio, Urine - atorvastatin (LIPITOR) 20 MG tablet; Take 1 tablet (20 mg total) by mouth daily.  Dispense: 90 tablet; Refill: 3  3. Essential hypertension - Hemoglobin 123456 - Basic Metabolic Panel - CBC with Differential  4. Hyperlipidemia, unspecified hyperlipidemia type - Hemoglobin A1c - Lipid Panel - atorvastatin (LIPITOR) 20 MG tablet; Take 1 tablet  (20 mg total) by mouth daily.  Dispense: 90 tablet; Refill: 3  5. Muscle cramps - Magnesium  6. Screening for colon cancer - Ambulatory referral to Gastroenterology  7. Heel pain, bilateral - Ambulatory referral to Podiatry  8. Screening for breast cancer - MM Digital Screening; Future  9. Lumbar  radiculopathy - traMADol (ULTRAM) 50 MG tablet; Take 1-2 tablets (50-100 mg total) by mouth every 8 (eight) hours as needed for up to 5 days.  Dispense: 30 tablet; Refill: 0 - discussed use of pain medication sparingly along with other pain relieving modalities- heat/ice, otc analgesics, continued Pt  10. Lumbar back pain - traMADol (ULTRAM) 50 MG tablet; Take 1-2 tablets (50-100 mg total) by mouth every 8 (eight) hours as needed for up to 5 days.  Dispense: 30 tablet; Refill: 0  11. Chronic pain of both knees - traMADol (ULTRAM) 50 MG tablet; Take 1-2 tablets (50-100 mg total) by mouth every 8 (eight) hours as needed for up to 5 days.  Dispense: 30 tablet; Refill: 0  - follow up in 6 months  Clarene Reamer, FNP-BC  [facility], [place]  10/12/2018 2:17 PM

## 2018-10-08 MED ORDER — ATORVASTATIN CALCIUM 20 MG PO TABS: 20.0000 mg | ORAL_TABLET | Freq: Every day | ORAL | 3 refills | Status: DC

## 2018-10-14 ENCOUNTER — Ambulatory Visit: Payer: Medicare PPO | Admitting: Physical Therapy

## 2018-10-14 ENCOUNTER — Encounter: Payer: Self-pay | Admitting: Gastroenterology

## 2018-10-19 ENCOUNTER — Encounter: Payer: Self-pay | Admitting: Podiatry

## 2018-10-19 ENCOUNTER — Ambulatory Visit (INDEPENDENT_AMBULATORY_CARE_PROVIDER_SITE_OTHER): Payer: Medicare PPO

## 2018-10-19 ENCOUNTER — Other Ambulatory Visit: Payer: Self-pay | Admitting: Podiatry

## 2018-10-19 ENCOUNTER — Other Ambulatory Visit: Payer: Self-pay

## 2018-10-19 ENCOUNTER — Ambulatory Visit (INDEPENDENT_AMBULATORY_CARE_PROVIDER_SITE_OTHER): Payer: Medicare PPO | Admitting: Podiatry

## 2018-10-19 DIAGNOSIS — M722 Plantar fascial fibromatosis: Secondary | ICD-10-CM | POA: Diagnosis not present

## 2018-10-19 DIAGNOSIS — M79675 Pain in left toe(s): Secondary | ICD-10-CM

## 2018-10-19 DIAGNOSIS — M79671 Pain in right foot: Secondary | ICD-10-CM

## 2018-10-19 DIAGNOSIS — B351 Tinea unguium: Secondary | ICD-10-CM | POA: Diagnosis not present

## 2018-10-19 DIAGNOSIS — M79674 Pain in right toe(s): Secondary | ICD-10-CM

## 2018-10-19 DIAGNOSIS — E1140 Type 2 diabetes mellitus with diabetic neuropathy, unspecified: Secondary | ICD-10-CM

## 2018-10-19 DIAGNOSIS — M79672 Pain in left foot: Secondary | ICD-10-CM

## 2018-10-19 DIAGNOSIS — E1149 Type 2 diabetes mellitus with other diabetic neurological complication: Secondary | ICD-10-CM

## 2018-10-19 NOTE — Progress Notes (Signed)
Subjective:   Patient ID: Autumn Valenzuela, female   DOB: 59 y.o.   MRN: [id number]   HPI Patient presents stating both of her heels are really bothering her and she does not remember specific injury   ROS      Objective:  Physical Exam  Neurovascular status intact with exquisite discomfort plantar fascial band bilateral with inflammation fluid around the medial band     Assessment:  Acute plantar fasciitis bilateral     Plan:  H&P reviewed condition and x-ray and injected the plantar fascial bilateral 3 mg Kenalog 5 mg Xylocaine and advised on supportive therapy physical therapy and reappoint as needed  X-rays indicate spur formation depression of the arch no indication of stress fracture arthritis

## 2018-10-20 ENCOUNTER — Institutional Professional Consult (permissible substitution): Payer: Medicare PPO | Admitting: Neurology

## 2018-10-20 ENCOUNTER — Ambulatory Visit (INDEPENDENT_AMBULATORY_CARE_PROVIDER_SITE_OTHER): Payer: Medicare PPO | Admitting: Neurology

## 2018-10-20 ENCOUNTER — Encounter: Payer: Self-pay | Admitting: Neurology

## 2018-10-20 VITALS — BP 132/88 | HR 92 | Temp 97.8°F | Ht 61.0 in | Wt 219.4 lb

## 2018-10-20 DIAGNOSIS — M542 Cervicalgia: Secondary | ICD-10-CM | POA: Diagnosis not present

## 2018-10-20 DIAGNOSIS — G8929 Other chronic pain: Secondary | ICD-10-CM

## 2018-10-20 DIAGNOSIS — R232 Flushing: Secondary | ICD-10-CM

## 2018-10-20 DIAGNOSIS — R519 Headache, unspecified: Secondary | ICD-10-CM

## 2018-10-20 DIAGNOSIS — M5010 Cervical disc disorder with radiculopathy, unspecified cervical region: Secondary | ICD-10-CM

## 2018-10-20 DIAGNOSIS — G441 Vascular headache, not elsewhere classified: Secondary | ICD-10-CM

## 2018-10-20 DIAGNOSIS — G44209 Tension-type headache, unspecified, not intractable: Secondary | ICD-10-CM

## 2018-10-20 DIAGNOSIS — M549 Dorsalgia, unspecified: Secondary | ICD-10-CM

## 2018-10-20 MED ORDER — GABAPENTIN 300 MG PO CAPS: ORAL_CAPSULE | ORAL | 1 refills | Status: DC

## 2018-10-20 MED ORDER — TIZANIDINE HCL 4 MG PO TABS: 4.0000 mg | ORAL_TABLET | Freq: Three times a day (TID) | ORAL | 1 refills | Status: DC

## 2018-10-20 NOTE — Patient Instructions (Signed)
I had a long discussion with the patient and her daughter regarding her chronic daily headaches which appear to have increased recently with neck pain and cervical radicular component as well.  I recommend further evaluation by checking MRI scan of the brain as well as cervical spine and increase her to do regular neck stretching exercises.  Increase gabapentin dose to 300 mg twice daily and discontinue Flexeril as it is not being effective.  Instead try Zanaflex 4 mg at night for 1 week increase if tolerated without side effects to twice daily for a week and then to 3 times daily.  I will also refer her to Dr. Jaynee Eagles my [facility] headache specialist to consider trigeminal nerve block or trigger point injections.  She will follow-up in the future with Dr. Jaynee Eagles and knows to schedule appointment with me is necessary.

## 2018-10-20 NOTE — Progress Notes (Signed)
[facility] [address] [place]. [place] [address] [phone]       OFFICE CONSULT NOTE  Autumn Valenzuela Date of Birth:  21-Apr-1960 Medical Record Number:  [id number]   Referring MD: Phylliss Bob  Reason for Referral: Headaches and neck pain HPI: [name] is a 58 year old African-American lady with remote history of stroke in 2007, depression, hypertension, bipolar disorder, migraines, chronic daily headaches and hyperlipidemia who is seen today for office consultation visit.  She is accompanied by her daughter.  History is obtained from them and review of prior medical records and have personally reviewed imaging films were available in PACS.  She states she has lifelong history of migraines but following a stroke in 2007 the headaches increase in frequency and severity.  She was initially seen by me in 2012 and started on Topamax and she did develop but when last seen in 2014 she is her headaches had progressed to chronic daily headaches with component of analgesic rebound and muscle tension.  And also started on Elavil and her last office visit was on 09/14/2013 and she was referred for polysomnogram for sleep apnea but that somehow never happened.  She states that now for the last 1 month she has had worsening of her headaches.  The headaches are mostly in the top and then they shoot to the back of the head neck as well as down her shoulders into her arms.  They are constant daily.  Present 24-hour 7 days a week.  She finds it difficult to sleep.  She has a tough time lying in bed and turning in either direction.  She has to sleep with specific position with 3 pillows on her chest and her both arms beneath her breasts pushing them up to get some relief in the pain in the back of her neck and arms.  She has been taking Flexeril 10 mg at night which initially was helping when she first started but is no longer helping.  She also takes gabapentin 300 mg at night which is  not been much effective.  She is still on Topamax 100 mg twice daily which she says was previously helping.  She does complain of some memory difficulties and at times she has misplacing objects and things and feels Topamax may be causing it.  She does take Mobic also every day but that for arthritis and she takes some Tylenol Extra Strength occasionally 2.  She has not had any recent brain or neck imaging studies.  She denies any weakness in her arms but does complain of pain shooting down her arms starting in the neck.  She has history of degenerative arthritis in her back but has not had any cervical studies done.  She has not been doing very regular neck stretching exercises.  She denies any recurrent stroke or TIA symptoms.  She states her blood pressure is under good control today it is 132/88.  She states her fasting sugars also well controlled.  She is on Lipitor 20 mg and last lipid profile on 10/07/2018 showed LDL cholesterol of 133 mg percent and HDL of 43.  She was started on Lipitor.  Hemoglobin A1c was elevated at 8.7 but down from 12.2 from 4 months ago..  ROS:   14 system review of systems is positive for headache, neck pain, radicular pain, muscle spasms tightness of muscles and all other systems negative  PMH:  Past Medical History:  Diagnosis Date   Back pain  Bipolar disorder ([place])    CVA (cerebral infarction)    Depression    Diabetes mellitus without complication (HCC)    High cholesterol    Hypertension    Left leg pain    Migraine    Stroke [facility])     Social History:  Social History   Socioeconomic History   Marital status: Single    Spouse name: Not on file   Number of children: 3   Years of education: 12   Highest education level: Not on file  Occupational History   Not on file  Social Needs   Financial resource strain: Not on file   Food insecurity    Worry: Not on file    Inability: Not on file   Transportation needs    Medical: Not  on file    Non-medical: Not on file  Tobacco Use   Smoking status: Former Smoker    Quit date: 12/13/2015    Years since quitting: 2.8   Smokeless tobacco: Never Used  Substance and Sexual Activity   Alcohol use: No   Drug use: No    Types: Marijuana    Comment: occ - states stopped smokin weed 04/2014   Sexual activity: Yes    Birth control/protection: None  Lifestyle   Physical activity    Days per week: Not on file    Minutes per session: Not on file   Stress: Not on file  Relationships   Social connections    Talks on phone: Not on file    Gets together: Not on file    Attends religious service: Not on file    Active member of club or organization: Not on file    Attends meetings of clubs or organizations: Not on file    Relationship status: Not on file   Intimate partner violence    Fear of current or ex partner: Not on file    Emotionally abused: Not on file    Physically abused: Not on file    Forced sexual activity: Not on file  Other Topics Concern   Not on file  Social History Narrative   Patient is single with 3 children.   Patient is right handed.   Patient has a high school education.   Patient drinks 3 cups daily.    Medications:   Current Outpatient Medications on File Prior to Visit  Medication Sig Dispense Refill   Accu-Chek FastClix Lancets MISC TEST 4 TIMES DAILY. DX E11.9 306 each 3   albuterol (PROVENTIL HFA;VENTOLIN HFA) 108 (90 Base) MCG/ACT inhaler Inhale 2 puffs into the lungs daily as needed for wheezing or shortness of breath. 18 g 2   albuterol (PROVENTIL) (2.5 MG/3ML) 0.083% nebulizer solution Take 2.5 mg by nebulization at bedtime as needed for wheezing.     ALPRAZolam (XANAX) 0.5 MG tablet Take 1 tablet (0.5 mg total) by mouth daily as needed for anxiety. 30 tablet 2   amitriptyline (ELAVIL) 25 MG tablet TAKE 1 TABLET(25 MG) BY MOUTH AT BEDTIME 90 tablet 1   atorvastatin (LIPITOR) 20 MG tablet Take 1 tablet (20 mg total)  by mouth daily. 90 tablet 3   Blood Glucose Calibration (ACCU-CHEK GUIDE CONTROL) LIQD 1 each by In Vitro route every 30 (thirty) days. Needs Accu-check guide monitor system. DX. E11.9 3 each 3   calcium-vitamin D (OSCAL WITH D) 500-200 MG-UNIT per tablet Take 1 tablet by mouth daily with breakfast.     DULoxetine (CYMBALTA) 60 MG capsule Take 1  capsule (60 mg total) by mouth at bedtime. 90 capsule 1   fluticasone (FLONASE) 50 MCG/ACT nasal spray Place 2 sprays into both nostrils daily. (Patient taking differently: Place 2 sprays into both nostrils daily as needed for allergies. ) 16 g 0   glucose blood (ACCU-CHEK GUIDE) test strip Use to test blood sugar 4 times a day.  Dx: E11.9 400 each 3   Insulin Glargine (LANTUS) 100 UNIT/ML Solostar Pen Inject 10 Units into the skin daily. If blood sugar over 200, increase by 2 units up to 40 units. 15 mL 1   Insulin Pen Needle 31G X 5 MM MISC 1 Units by Does not apply route at bedtime. 90 each 3   ketorolac (ACULAR) 0.4 % SOLN Place 1 drop into both eyes daily as needed (eye irritation).      levocetirizine (XYZAL) 5 MG tablet Take 1 tablet (5 mg total) by mouth every evening. 30 tablet 2   loratadine (CLARITIN) 10 MG tablet Take 1 tablet (10 mg total) by mouth daily. (Patient taking differently: Take 10 mg by mouth daily as needed for allergies. ) 30 tablet 0   meloxicam (MOBIC) 15 MG tablet One tablet every 24 hours as needed for inflammation. 90 tablet 0   metFORMIN (GLUCOPHAGE-XR) 500 MG 24 hr tablet Take 1 tablet (500 mg total) by mouth 2 (two) times daily with a meal. 180 tablet 1   ondansetron (ZOFRAN-ODT) 8 MG disintegrating tablet Take 1 tablet (8 mg total) by mouth every 8 (eight) hours as needed for nausea. 20 tablet 0   topiramate (TOPAMAX) 100 MG tablet Take 1 tablet (100 mg total) by mouth 2 (two) times daily. 60 tablet 2   triamcinolone ointment (KENALOG) 0.1 % Apply topically 2 (two) times daily. For no more than 10 days. 30 g 1    Vitamin D, Ergocalciferol, (DRISDOL) 50000 units CAPS capsule Take 1 capsule (50,000 Units total) by mouth every 7 (seven) days. 12 capsule 3   No current facility-administered medications on file prior to visit.     Allergies:   Allergies  Allergen Reactions   Cefazolin Swelling and Other (See Comments)    Rapid iv infusion caused swelling and burning at the iv site    Physical Exam General: Obese middle-aged African-American lady, seated, in no evident distress Head: head normocephalic and atraumatic.   Neck: supple with no carotid or supraclavicular bruits Cardiovascular: regular rate and rhythm, no murmurs Musculoskeletal: no deformity.  Diffuse tenderness of posterior cervical muscles and bilateral occipital grooves Skin:  no rash/petichiae Vascular:  Normal pulses all extremities  Neurologic Exam Mental Status: Awake and fully alert. Oriented to place and time. Recent and remote memory intact. Attention span, concentration and fund of knowledge appropriate. Mood and affect appropriate.  Cranial Nerves: Fundoscopic exam reveals sharp disc margins. Pupils equal, briskly reactive to light. Extraocular movements full without nystagmus. Visual fields full to confrontation. Hearing intact. Facial sensation intact. Face, tongue, palate moves normally and symmetrically.  Motor: Normal bulk and tone. Normal strength in all tested extremity muscles. Sensory.: intact to touch , pinprick , position and vibratory sensation.  Coordination: Rapid alternating movements normal in all extremities. Finger-to-nose and heel-to-shin performed accurately bilaterally. Gait and Station: Arises from chair without difficulty. Stance is normal. Gait demonstrates normal stride length and balance . Able to heel, toe and tandem walk with mild difficulty.  Reflexes: 1+ and symmetric. Toes downgoing.       ASSESSMENT: 58 year old African-American lady with chronic headaches and  neck pain with radicular  symptoms likely muscle tension headaches and neck pain.  Remote history of cerebellar infarcts and stable from neurovascular standpoint.  Vascular risk factors of diabetes, hypertension, hyperlipidemia, obesity and cerebrovascular disease     PLAN: I had a long discussion with the patient and her daughter regarding her chronic daily headaches which appear to have increased recently with neck pain and cervical radicular component as well.  I recommend further evaluation by checking MRI scan of the brain as well as cervical spine and increase her to do regular neck stretching exercises.  Increase gabapentin dose to 300 mg twice daily and discontinue Flexeril as it is not being effective.  Instead try Zanaflex 4 mg at night for 1 week increase if tolerated without side effects to twice daily for a week and then to 3 times daily.  I will also refer her to Dr. Jaynee Eagles my [facility] headache specialist to consider trigeminal nerve block or trigger point injections.  Greater than 50% time during this 45-minute consultation visit was spent on counseling and coordination of care about her chronic daily headaches and neck pain and radicular pain and answering questions she will follow-up in the future with Dr. Jaynee Eagles and knows to schedule appointment with me is necessary. Antony Contras, MD  [facility] [address] [place], [place] [address]  Phone [phone] Fax [phone] Note: This document was prepared with digital dictation and possible smart phrase technology. Any transcriptional errors that result from this process are unintentional.

## 2018-10-21 ENCOUNTER — Ambulatory Visit: Payer: Medicare PPO | Attending: Orthopedic Surgery | Admitting: Physical Therapy

## 2018-10-21 ENCOUNTER — Other Ambulatory Visit: Payer: Self-pay

## 2018-10-21 ENCOUNTER — Telehealth: Payer: Self-pay | Admitting: Neurology

## 2018-10-21 ENCOUNTER — Encounter: Payer: Self-pay | Admitting: Physical Therapy

## 2018-10-21 DIAGNOSIS — G8929 Other chronic pain: Secondary | ICD-10-CM

## 2018-10-21 DIAGNOSIS — M5442 Lumbago with sciatica, left side: Secondary | ICD-10-CM | POA: Insufficient documentation

## 2018-10-21 DIAGNOSIS — M5441 Lumbago with sciatica, right side: Secondary | ICD-10-CM | POA: Insufficient documentation

## 2018-10-21 DIAGNOSIS — M546 Pain in thoracic spine: Secondary | ICD-10-CM | POA: Diagnosis present

## 2018-10-21 DIAGNOSIS — M6281 Muscle weakness (generalized): Secondary | ICD-10-CM | POA: Diagnosis present

## 2018-10-21 NOTE — Therapy (Signed)
[place], [place], [address] Phone: [phone]   Fax:  [phone]  Physical Therapy Treatment  Patient Details  Name: Autumn Valenzuela MRN: [id number] Date of Birth: April 14, 1960 Referring Provider (PT): Dr. Lynann Bologna    Encounter Date: 10/21/2018  PT End of Session - 10/21/18 1248    Visit Number  4    Number of Visits  8    Date for PT Re-Evaluation  11/04/18    Authorization Type  [employer] MCR/ MCD    PT Start Time  1236    PT Stop Time  1315    PT Time Calculation (min)  39 min    Activity Tolerance  Patient tolerated treatment well    Behavior During Therapy  [facility] for tasks assessed/performed       Past Medical History:  Diagnosis Date  . Back pain   . Bipolar disorder ([place])   . CVA (cerebral infarction)   . Depression   . Diabetes mellitus without complication ([place])   . High cholesterol   . Hypertension   . Left leg pain   . Migraine   . Stroke [facility])     Past Surgical History:  Procedure Laterality Date  . ANTERIOR LATERAL LUMBAR FUSION WITH PERCUTANEOUS SCREW 1 LEVEL Left 02/11/2018   Procedure: LEFT LATERAL LUMBAR  FOUR-FIVE INTERBODY FUSION WITH INSTRUMENTATION AND ALLOGRAFT;  Surgeon: Phylliss Bob, MD;  Location: [facility];  Service: Orthopedics;  Laterality: Left;  LEFT LATERAL LUMBAR  FOUR-FIVE INTERBODY FUSION WITH INSTRUMENTATION AND ALLOGRAFT  . BACK SURGERY     Lumbar fusion L5-S1  . DIABETES    . EYE SURGERY Bilateral   . KNEE SURGERY    . l 4-l5 status post lateral posterior fusion January 30,2020    . NEUROFORAMINAL STENOSIS Left    Severe L4-L5, involving the level above previous fusion.  Marland Kitchen PARTIAL HYSTERECTOMY    . RADICULOPATHY Left    L4, Secondary to an L4-L5 Spondylolisthesis  . SHOULDER SURGERY      There were no vitals filed for this visit.  Subjective Assessment - 10/21/18 1243    Subjective  Injection coming up in my neck (Neuro) for neck pain.  Got  injections in both heels (cortisone) . Has headaches all the time. May need a MRI/CT scan    Currently in Pain?  Yes    Pain Score  4     Pain Location  Back    Pain Orientation  Lower    Pain Descriptors / Indicators  Aching;Sore    Pain Type  Chronic pain    Pain Onset  More than a month ago    Pain Frequency  Intermittent    Aggravating Factors   standing, walking, nighttime    Pain Relieving Factors  exercise          [facility] Adult PT Treatment/Exercise - 10/21/18 0001      Lumbar Exercises: Stretches   Lower Trunk Rotation Limitations  x 10 each side, legs on ball       Lumbar Exercises: Supine   Ab Set  10 reps    AB Set Limitations  with black ball    Pelvic Tilt  10 reps    Clam  10 reps    Clam Limitations  green band     Bent Knee Raise  10 reps    Bent Knee Raise Limitations  green band     Bridge  10 reps  Bridge Limitations  2 sets with ball     Large Ball Oblique Isometric  5 reps    Large Ball Oblique Isometric Limitations  each side       Lumbar Exercises: Sidelying   Hip Abduction  20 reps    Hip Abduction Weights (lbs)  green band              PT Education - 10/21/18 1604    Education Details  body mechanics, lifting from floor    Person(s) Educated  Patient    Methods  Explanation    Comprehension  Verbalized understanding;Returned demonstration       PT Short Term Goals - 10/21/18 1605      PT SHORT TERM GOAL #1   Title  Pt will be I with initial HEP    Status  Partially Met      PT SHORT TERM GOAL #2   Title  Pt will be able to show good form with squatting and lifting light items from the floor to ease strain on back    Status  Partially Met      PT SHORT TERM GOAL #3   Title  Pt will report less pain in low back with basic home tasks and holding her grandson, no more than moderate 5/10    Status  On-going        PT Long Term Goals - 09/09/18 2028      PT LONG TERM GOAL #1   Title  Pt will be I with HEP for back, LE  strength    Time  8    Period  Weeks    Status  New    Target Date  11/04/18      PT LONG TERM GOAL #2   Title  Pt will lower FOTO score to 55% or better to show improved functional mobility.    Time  8    Period  Weeks    Status  New    Target Date  11/04/18      PT LONG TERM GOAL #3   Title  LLE strength will improve to 4+/5 or more for more stability to gait, lifting and carrying    Time  8    Period  Weeks    Status  New    Target Date  11/04/18      PT LONG TERM GOAL #4   Title  Pt will lift, carry carseat with good mechanics and pain minimal    Time  8    Period  Weeks    Status  New    Target Date  11/04/18            Plan - 10/21/18 1245    Clinical Impression Statement  Pt dealing with multiple areas of pain today.  Back pain did not increase until she demonstrated her lifting technique.  Modifiying her technque did make it less painful.  Good progress thus far.    PT Treatment/Interventions  ADLs/Self Care Home Management;Manual techniques;Functional mobility training;Moist Heat;Cryotherapy;Electrical Stimulation;Therapeutic exercise;Therapeutic activities;Balance training;Patient/family education;Passive range of motion    PT Next Visit Plan  body mechanics    PT Home Exercise Plan  posterior tilt, bridge, LTR, horiz abd yello band with chin tuck , childs pose and cat       Patient will benefit from skilled therapeutic intervention in order to improve the following deficits and impairments:  Increased fascial restricitons, Impaired sensation, Improper body mechanics, Pain, Postural dysfunction, Increased muscle  spasms, Decreased mobility, Decreased activity tolerance, Decreased range of motion, Decreased strength, Obesity, Impaired flexibility, Decreased balance, Difficulty walking  Visit Diagnosis: Chronic bilateral low back pain with bilateral sciatica  Pain in thoracic spine  Muscle weakness (generalized)     Problem List Patient Active Problem  List   Diagnosis Date Noted  . Acute pain of both knees 06/18/2018  . Radiculopathy 02/11/2018  . Bipolar disorder ([place]) 03/29/2016  . Diabetic ketoacidosis ([place]) 03/24/2016  . Dermatitis 03/24/2016  . HTN (hypertension) 03/24/2016  . HLD (hyperlipidemia) 03/24/2016  . DM (diabetes mellitus) ([place]) 03/24/2016  . Hyperglycemia   . Chronic daily headache 11/23/2012  . 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 Depression 11/23/2012    Yocelin Vanlue 10/21/2018, 4:08 PM  [facility] [address] [place], [place], [address] Phone: [phone]   Fax:  [phone]  Name: Stuart Guillen MRN: [id number] Date of Birth: 02-06-1960  Raeford Razor, PT 10/21/18 4:08 PM Phone: [phone] Fax: [phone]

## 2018-10-21 NOTE — Telephone Encounter (Signed)
[employer] pending faxed notes.  

## 2018-10-22 NOTE — Telephone Encounter (Signed)
Mcarthur Rossetti Josem Kaufmann: [id number] (exp. 10/21/18 to 11/20/18) order sent to GI. They will reach out to the patient to schedule.

## 2018-10-29 ENCOUNTER — Other Ambulatory Visit: Payer: Self-pay | Admitting: Family Medicine

## 2018-10-30 ENCOUNTER — Other Ambulatory Visit: Payer: Self-pay

## 2018-10-30 ENCOUNTER — Encounter: Payer: Self-pay | Admitting: Physical Therapy

## 2018-10-30 ENCOUNTER — Ambulatory Visit: Payer: Medicare PPO | Admitting: Physical Therapy

## 2018-10-30 ENCOUNTER — Encounter

## 2018-10-30 DIAGNOSIS — M546 Pain in thoracic spine: Secondary | ICD-10-CM

## 2018-10-30 DIAGNOSIS — G8929 Other chronic pain: Secondary | ICD-10-CM

## 2018-10-30 DIAGNOSIS — M6281 Muscle weakness (generalized): Secondary | ICD-10-CM

## 2018-10-30 DIAGNOSIS — M5442 Lumbago with sciatica, left side: Secondary | ICD-10-CM | POA: Diagnosis not present

## 2018-10-30 DIAGNOSIS — M5441 Lumbago with sciatica, right side: Secondary | ICD-10-CM

## 2018-10-30 NOTE — Therapy (Signed)
[place], [place], [address] Phone: [phone]   Fax:  [phone]  Physical Therapy Treatment/Discharge  Patient Details  Name: Autumn Valenzuela MRN: [id number] Date of Birth: 1960/10/17 Referring Provider (PT): Dr. Lynann Bologna    Encounter Date: 10/30/2018  PT End of Session - 10/30/18 1206    Visit Number  5    Number of Visits  8    Date for PT Re-Evaluation  11/04/18    PT Start Time  1130    PT Stop Time  1210    PT Time Calculation (min)  40 min    Activity Tolerance  Patient tolerated treatment well    Behavior During Therapy  [facility] for tasks assessed/performed       Past Medical History:  Diagnosis Date  . Back pain   . Bipolar disorder ([place])   . CVA (cerebral infarction)   . Depression   . Diabetes mellitus without complication ([place])   . High cholesterol   . Hypertension   . Left leg pain   . Migraine   . Stroke [facility])     Past Surgical History:  Procedure Laterality Date  . ANTERIOR LATERAL LUMBAR FUSION WITH PERCUTANEOUS SCREW 1 LEVEL Left 02/11/2018   Procedure: LEFT LATERAL LUMBAR  FOUR-FIVE INTERBODY FUSION WITH INSTRUMENTATION AND ALLOGRAFT;  Surgeon: Phylliss Bob, MD;  Location: [facility];  Service: Orthopedics;  Laterality: Left;  LEFT LATERAL LUMBAR  FOUR-FIVE INTERBODY FUSION WITH INSTRUMENTATION AND ALLOGRAFT  . BACK SURGERY     Lumbar fusion L5-S1  . DIABETES    . EYE SURGERY Bilateral   . KNEE SURGERY    . l 4-l5 status post lateral posterior fusion January 30,2020    . NEUROFORAMINAL STENOSIS Left    Severe L4-L5, involving the level above previous fusion.  Marland Kitchen PARTIAL HYSTERECTOMY    . RADICULOPATHY Left    L4, Secondary to an L4-L5 Spondylolisthesis  . SHOULDER SURGERY      There were no vitals filed for this visit.  Subjective Assessment - 10/30/18 1144    Subjective  Headaches continue.  Back pain is 5/10.  L knee is swollen.  Has alot going on right now.  The  hardest thing to do with her back is standing (out shopping) or bending.    Currently in Pain?  Yes    Pain Score  5     Pain Location  Back    Pain Orientation  Lower;Mid    Pain Descriptors / Indicators  Aching    Pain Type  Chronic pain    Pain Onset  More than a month ago    Pain Frequency  Intermittent    Aggravating Factors   standing, walking, bending    Pain Relieving Factors  rest          [facility] Adult PT Treatment/Exercise - 10/30/18 0001      Self-Care   Lifting  lifting, avoid twisting and bending ,HEP , FOTO result s      Lumbar Exercises: Stretches   Active Hamstring Stretch  3 reps    Single Knee to Chest Stretch  3 reps    Lower Trunk Rotation Limitations  x 10 each side, legs on ball    with ball      Lumbar Exercises: Aerobic   Recumbent Bike  5 min UE and LE x 5       Lumbar Exercises: Seated   Sit to Stand  10 reps  Sit to Stand Limitations  and squats x 10       Lumbar Exercises: Supine   Heel Slides Limitations  used ball x 2 x 10     Bridge  10 reps    Bridge Limitations  2 sets with ball       Lumbar Exercises: Quadruped   Straight Leg Raise  10 reps    Other Quadruped Lumbar Exercises  childs pose x 2               PT Short Term Goals - 10/30/18 1211      PT SHORT TERM GOAL #1   Title  Pt will be I with initial HEP    Status  Achieved      PT SHORT TERM GOAL #2   Title  Pt will be able to show good form with squatting and lifting light items from the floor to ease strain on back    Status  Achieved      PT SHORT TERM GOAL #3   Title  Pt will report less pain in low back with basic home tasks and holding her grandson, no more than moderate 5/10    Status  Partially Met        PT Long Term Goals - 10/30/18 1210      PT LONG TERM GOAL #1   Title  Pt will be I with HEP for back, LE strength    Status  Achieved      PT LONG TERM GOAL #2   Title  Pt will lower FOTO score to 55% or better to show improved functional  mobility.    Status  Achieved      PT LONG TERM GOAL #3   Title  LLE strength will improve to 4+/5 or more for more stability to gait, lifting and carrying    Status  Achieved      PT LONG TERM GOAL #4   Title  Pt will lift, carry carseat with good mechanics and pain minimal    Status  Achieved            Plan - 10/30/18 1215    Clinical Impression Statement  Patient has met her goals and feels ready to DC.  She is having cervical imaging done in soona nd may return for cervical pain. DC from PT.    PT Treatment/Interventions  ADLs/Self Care Home Management;Manual techniques;Functional mobility training;Moist Heat;Cryotherapy;Electrical Stimulation;Therapeutic exercise;Therapeutic activities;Balance training;Patient/family education;Passive range of motion    PT Next Visit Plan  NA, DC    PT Home Exercise Plan  posterior tilt, bridge, LTR, horiz abd yello band with chin tuck , childs pose and cat, KTC, hamstring and squats    Consulted and Agree with Plan of Care  Patient       Patient will benefit from skilled therapeutic intervention in order to improve the following deficits and impairments:  Increased fascial restricitons, Impaired sensation, Improper body mechanics, Pain, Postural dysfunction, Increased muscle spasms, Decreased mobility, Decreased activity tolerance, Decreased range of motion, Decreased strength, Obesity, Impaired flexibility, Decreased balance, Difficulty walking  Visit Diagnosis: Chronic bilateral low back pain with bilateral sciatica  Pain in thoracic spine  Muscle weakness (generalized)     Problem List Patient Active Problem List   Diagnosis Date Noted  . Acute pain of both knees 06/18/2018  . Radiculopathy 02/11/2018  . Bipolar disorder ([place]) 03/29/2016  . Diabetic ketoacidosis ([place]) 03/24/2016  . Dermatitis 03/24/2016  . HTN (hypertension) 03/24/2016  .  HLD (hyperlipidemia) 03/24/2016  . DM (diabetes mellitus) ([place]) 03/24/2016  .  Hyperglycemia   . Chronic daily headache 11/23/2012  . 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 Depression 11/23/2012    [name] 10/30/2018, 12:21 PM  [facility] [address] [place], [place], [address] Phone: [phone]   Fax:  [phone]  Name: Autumn Valenzuela MRN: [id number] Date of Birth: [birth year]  PHYSICAL THERAPY DISCHARGE SUMMARY  Visits from Start of Care: 5  Current functional level related to goals / functional outcomes: See above    Remaining deficits: Pain with standing >1-2 hours, bending .  Good strength and form with HEP  May benefit from PT for cervical spine, headaches    Education / Equipment: HEP, body mechanics  Plan: Patient agrees to discharge.  Patient goals were met. Patient is being discharged due to being pleased with the current functional level.  ?????    Raeford Razor, PT 10/30/18 12:21 PM Phone: [phone] Fax: [phone]

## 2018-11-02 ENCOUNTER — Telehealth: Payer: Self-pay

## 2018-11-02 NOTE — Telephone Encounter (Signed)
Spoke with patient. Patient states that she went for P.T on 10/30/2018 (notes in epic). That day she was not having a good day, felt weak in her legs/kness and she told physical therapist that. One of the exercises she had to do was getting into "a dog position-on her knees and elbows and arching the back." When she woke up on 10/31/2018 she felt her right knee achy, so she sat for a minute before standing up, when she stood up she felt throbbing/pain sensation in her right knee to the point she had hard time bending it. She had hard time sleeping due to the pain over the weekend. She is having hard time now with going up the steps, pain right knee present. Also has pain in the left elbow from been in that position on Friday. She has been using ice/heat to the area. Pain is a little worse than it was Saturday. Patient is upset and concerned, she went for P.T to try and help her knees but she is now hurting from trying exercises and it is affecting her in getting around, especially going up the steps. Limping now. (she has not done any exercises this weekend except from what was done on 10/30/2018 with therapist.

## 2018-11-02 NOTE — Telephone Encounter (Signed)
Called and spoke with patient, possible acute disturbance of knee on chronic condition. Will have her continue current meds, including meloxicam, gentle ROM and she will try wearing her brace during the day. She will touch base with me if no improvement in a couple of days.

## 2018-11-02 NOTE — Telephone Encounter (Signed)
Patient l/m on triage voicemail stating she had medical question for Debbie and to call her back. I called and left a message for patient to call back to discuss this with her.

## 2018-11-11 ENCOUNTER — Inpatient Hospital Stay: Admission: RE | Admit: 2018-11-11 | Payer: Medicare PPO | Source: Ambulatory Visit

## 2018-11-11 ENCOUNTER — Other Ambulatory Visit: Payer: Medicare PPO

## 2018-11-25 ENCOUNTER — Telehealth: Payer: Self-pay | Admitting: Family Medicine

## 2018-11-25 ENCOUNTER — Telehealth: Payer: Self-pay | Admitting: *Deleted

## 2018-11-25 NOTE — Telephone Encounter (Signed)
Noted  

## 2018-11-25 NOTE — Telephone Encounter (Signed)
Opened window in error. 

## 2018-11-25 NOTE — Telephone Encounter (Signed)
Spoke with patient's daughter - encouraged her to keep the appt as scheduled for the colonoscopy - explained that given Covid restrictions and it being close to the end of the year it might be difficult to get another appt this short notice. Pts daughter states that her experience just on the phone was not pleasant and that she felt she was disrespected and and not treated well at all - I encouraged her to speak with someone when she goes to her appt tomorrow so that the concerns can be addressed and possibly avoid this happening to another patient. I also told her that she can discuss the No Show fee $50 with them as well - if she feels that she is being wrongly charged - but the most important thing is making sure that Autumn Valenzuela gets the medical care she needs at this point and is taken care of, Her daughter agreed. I advised that if at any time during her visit tomorrow she feels she has an unpleasant interaction then this needs to be discussed with the office manager for that practice.   Autumn Valenzuela was very appreciative of the call today and reassurance - she wants what is best for Mother and agrees keeping the appt for 11/26/18 is the best option. I advised that if after the Colonoscopy is completed and they are still not satisfied with customer service then we can most definitely refer elsewhere.   Will send to [place] to make aware that we are continuing with procedure as scheduled - daughter will be in touch.   Nothing further needed.

## 2018-11-25 NOTE — Telephone Encounter (Signed)
ATC, voicemail left for daughter

## 2018-11-25 NOTE — Telephone Encounter (Signed)
Patient's daughter Bubba Camp is calling in regards to the patient's referral to GI. She would like to know if there is another GI office you could refer the patient to., [place] stated they were just not very happy with the office she is scheduled to be seen at.    Daughter is requesting a call back today, due to having appointment scheduled in the GI office tomorrow. She really did not want to take the patient there unless absolutely neccessary    Please advise

## 2018-11-25 NOTE — Telephone Encounter (Signed)
Please call patient and tell her that I am sorry she had a bad customer service experience with the office but since her colonoscopy is next week, I don't know that she would be able to get an appointment for screening colonoscopy with another practice in a timely matter. The gastroenterologist, Dr. Havery Moros, who will be doing her colonoscopy is excellent and she will be in very good hands. If she still would like to go to another practice, I will gladly put in the referral.

## 2018-11-25 NOTE — Telephone Encounter (Signed)
Called patient because of no-show.  Patient upset because we don't call with reminder calls for pre-visit appointment and this is second appointment she has forgotten.  Patient rescheduled appt with scheduler,

## 2018-11-26 ENCOUNTER — Other Ambulatory Visit: Payer: Self-pay

## 2018-11-26 ENCOUNTER — Ambulatory Visit: Payer: Medicare PPO

## 2018-11-26 ENCOUNTER — Ambulatory Visit (AMBULATORY_SURGERY_CENTER): Payer: Medicare PPO

## 2018-11-26 VITALS — Temp 96.8°F | Ht 61.0 in | Wt 216.4 lb

## 2018-11-26 DIAGNOSIS — Z1159 Encounter for screening for other viral diseases: Secondary | ICD-10-CM

## 2018-11-26 DIAGNOSIS — Z1211 Encounter for screening for malignant neoplasm of colon: Secondary | ICD-10-CM

## 2018-11-26 MED ORDER — NA SULFATE-K SULFATE-MG SULF 17.5-3.13-1.6 GM/177ML PO SOLN: 1.0000 | Freq: Once | ORAL | 0 refills | Status: AC

## 2018-11-26 NOTE — Progress Notes (Signed)
Denies allergies to eggs or soy products. Denies complication of anesthesia or sedation. Denies use of weight loss medication. Denies use of O2.   Emmi instructions given for colonoscopy.  Covid screening is scheduled for 11/12/@12 :40 Pm.

## 2018-11-27 ENCOUNTER — Telehealth: Payer: Self-pay

## 2018-11-27 ENCOUNTER — Ambulatory Visit
Admission: RE | Admit: 2018-11-27 | Discharge: 2018-11-27 | Disposition: A | Discharge status: Home / Self Care | Payer: Medicare PPO | Source: Ambulatory Visit | Attending: Family Medicine | Admitting: Family Medicine

## 2018-11-27 DIAGNOSIS — Z1239 Encounter for other screening for malignant neoplasm of breast: Secondary | ICD-10-CM

## 2018-11-27 LAB — SARS CORONAVIRUS 2 (TAT 6-24 HRS): SARS Coronavirus 2: NEGATIVE

## 2018-11-27 NOTE — Telephone Encounter (Signed)
Pt left v/m; pt saw Dr Leonie Man x 1 on 10/20/18. Per office note on 10/20/18 see where Dr Leonie Man referred pt to Dr Jaynee Eagles. I do not see where appt with Dr Jaynee Eagles was scheduled. Pt left v/m since pain has not gone away and wants [facility] to find out when her next appt is going to be.  I called Dr Cathren Laine office which is closed this afternoon. FYI to [facility] CMA.

## 2018-11-30 ENCOUNTER — Ambulatory Visit
Admission: RE | Admit: 2018-11-30 | Discharge: 2018-11-30 | Disposition: A | Discharge status: Home / Self Care | Payer: Medicare PPO | Source: Ambulatory Visit | Attending: Neurology | Admitting: Neurology

## 2018-11-30 ENCOUNTER — Ambulatory Visit (AMBULATORY_SURGERY_CENTER): Payer: Medicare PPO | Admitting: Gastroenterology

## 2018-11-30 ENCOUNTER — Other Ambulatory Visit: Payer: Self-pay

## 2018-11-30 ENCOUNTER — Encounter: Payer: Self-pay | Admitting: Gastroenterology

## 2018-11-30 ENCOUNTER — Other Ambulatory Visit: Payer: Self-pay | Admitting: Neurology

## 2018-11-30 VITALS — BP 119/77 | HR 73 | Temp 98.1°F | Resp 19 | Ht 61.0 in | Wt 216.0 lb

## 2018-11-30 DIAGNOSIS — D214 Benign neoplasm of connective and other soft tissue of abdomen: Secondary | ICD-10-CM

## 2018-11-30 DIAGNOSIS — Z1211 Encounter for screening for malignant neoplasm of colon: Secondary | ICD-10-CM | POA: Diagnosis not present

## 2018-11-30 DIAGNOSIS — D128 Benign neoplasm of rectum: Secondary | ICD-10-CM | POA: Diagnosis not present

## 2018-11-30 DIAGNOSIS — G441 Vascular headache, not elsewhere classified: Secondary | ICD-10-CM

## 2018-11-30 DIAGNOSIS — D127 Benign neoplasm of rectosigmoid junction: Secondary | ICD-10-CM

## 2018-11-30 DIAGNOSIS — M542 Cervicalgia: Secondary | ICD-10-CM

## 2018-11-30 DIAGNOSIS — K635 Polyp of colon: Secondary | ICD-10-CM | POA: Diagnosis not present

## 2018-11-30 MED ORDER — SODIUM CHLORIDE 0.9 % IV SOLN: 500.0000 mL | INTRAVENOUS | Status: DC

## 2018-11-30 NOTE — Op Note (Addendum)
[place] Patient Name: Autumn Valenzuela Procedure Date: 11/30/2018 8:40 AM MRN: [id number] Endoscopist: Remo Lipps P. Havery Moros , MD Age: 58 Referring MD:  Date of Birth: 01-02-1961 Gender: Female Account #: [id number] Procedure:                Colonoscopy Indications:              Screening for colorectal malignant neoplasm Medicines:                Monitored Anesthesia Care Procedure:                Pre-Anesthesia Assessment:                           - Prior to the procedure, a History and Physical                            was performed, and patient medications and                            allergies were reviewed. The patient's tolerance of                            previous anesthesia was also reviewed. The risks                            and benefits of the procedure and the sedation                            options and risks were discussed with the patient.                            All questions were answered, and informed consent                            was obtained. Prior Anticoagulants: The patient has                            taken no previous anticoagulant or antiplatelet                            agents. ASA Grade Assessment: III - A patient with                            severe systemic disease. After reviewing the risks                            and benefits, the patient was deemed in                            satisfactory condition to undergo the procedure.                           After obtaining informed consent, the colonoscope  was passed under direct vision. Throughout the                            procedure, the patient's blood pressure, pulse, and                            oxygen saturations were monitored continuously. The                            Colonoscope was introduced through the anus and                            advanced to the the terminal ileum, with                            identification of  the appendiceal orifice and IC                            valve. The colonoscopy was performed without                            difficulty. The patient tolerated the procedure                            well. The quality of the bowel preparation was                            adequate. The terminal ileum, ileocecal valve,                            appendiceal orifice, and rectum were photographed. Scope In: 8:54:13 AM Scope Out: 9:23:46 AM Scope Withdrawal Time: 0 hours 26 minutes 25 seconds  Total Procedure Duration: 0 hours 29 minutes 33 seconds  Findings:                 The perianal and digital rectal examinations were                            normal.                           The terminal ileum appeared normal.                           A small polypoid lesion was found in the cecum,                            roughly 4-76mm in size, located behind the IC valve.                            The lesion was suspected to be subepithelial and                            hard to palpation with forceps. Bite on bite  biopsies were taken with a cold forceps for                            histology.                           Two flat polyps were found in the recto-sigmoid                            colon. The polyps were 4 to 5 mm in size. These                            polyps were removed with a cold snare. Resection                            and retrieval were complete.                           A 3 mm polyp was found in the rectum. The polyp was                            sessile. The polyp was removed with a cold snare.                            Resection and retrieval were complete.                           Internal hemorrhoids were found during                            retroflexion. The hemorrhoids were small.                           The exam was otherwise without abnormality. Complications:            No immediate complications. Estimated blood  loss:                            Minimal. Estimated Blood Loss:     Estimated blood loss was minimal. Impression:               - The examined portion of the ileum was normal.                           - Small benign appearing subepithelial polypoid                            lesion in the cecum. Biopsied as above.                           - Two 4 to 5 mm polyps at the recto-sigmoid colon,                            removed with a cold snare. Resected and retrieved.                           -  One 3 mm polyp in the rectum, removed with a cold                            snare. Resected and retrieved.                           - Internal hemorrhoids.                           - The examination was otherwise normal. Recommendation:           - Patient has a contact number available for                            emergencies. The signs and symptoms of potential                            delayed complications were discussed with the                            patient. Return to normal activities tomorrow.                            Written discharge instructions were provided to the                            patient.                           - Resume previous diet.                           - Continue present medications.                           - Await pathology results. Remo Lipps P. Yazmeen Woolf, MD 11/30/2018 9:32:47 AM This report has been signed electronically.

## 2018-11-30 NOTE — Telephone Encounter (Signed)
Will route to referrals coordinator in the office to see if status of referral can be checked.

## 2018-11-30 NOTE — Progress Notes (Signed)
Pt's blood sugar was 275 in the recovery room.  She was awake and denied any sx of hyperglycemia.  I advised pt to go home on discharge and eat breakfast, and take her METFORMIN.  Pt said, "I am going to do that".    No problems noted in the recovery room. maw

## 2018-11-30 NOTE — Progress Notes (Signed)
Report to PACU, RN, vss, BBS= Clear.  

## 2018-11-30 NOTE — Telephone Encounter (Signed)
Per [facility], pt was to call them and schedule appt w/Dr. Coral Ceo.  Per Delton Coombes, she will call pt and schedule the appt.

## 2018-11-30 NOTE — Progress Notes (Signed)
Called to room to assist during endoscopic procedure.  Patient ID and intended procedure confirmed with present staff. Received instructions for my participation in the procedure from the performing physician.  

## 2018-11-30 NOTE — Progress Notes (Signed)
Temp JB V/s CW I have reviewed the patient's medical history in detail and updated the computerized patient record. 

## 2018-11-30 NOTE — Patient Instructions (Addendum)
YOU HAD AN ENDOSCOPIC PROCEDURE TODAY AT [facility]:   Refer to the procedure report that was given to you for any specific questions about what was found during the examination.  If the procedure report does not answer your questions, please call your gastroenterologist to clarify.  If you requested that your care partner not be given the details of your procedure findings, then the procedure report has been included in a sealed envelope for you to review at your convenience later.  YOU SHOULD EXPECT: Some feelings of bloating in the abdomen. Passage of more gas than usual.  Walking can help get rid of the air that was put into your GI tract during the procedure and reduce the bloating. If you had a lower endoscopy (such as a colonoscopy or flexible sigmoidoscopy) you may notice spotting of blood in your stool or on the toilet paper. If you underwent a bowel prep for your procedure, you may not have a normal bowel movement for a few days.  Please Note:  You might notice some irritation and congestion in your nose or some drainage.  This is from the oxygen used during your procedure.  There is no need for concern and it should clear up in a day or so.  SYMPTOMS TO REPORT IMMEDIATELY:   Following lower endoscopy (colonoscopy or flexible sigmoidoscopy):  Excessive amounts of blood in the stool  Significant tenderness or worsening of abdominal pains  Swelling of the abdomen that is new, acute  Fever of 100F or higher    For urgent or emergent issues, a gastroenterologist can be reached at any hour by calling [phone].   DIET:  We do recommend a small meal at first, but then you may proceed to your regular diet.  Drink plenty of fluids but you should avoid alcoholic beverages for 24 hours.  ACTIVITY:  You should plan to take it easy for the rest of today and you should NOT DRIVE or use heavy machinery until tomorrow (because of the sedation medicines used during the test).     FOLLOW UP: Our staff will call the number listed on your records 48-72 hours following your procedure to check on you and address any questions or concerns that you may have regarding the information given to you following your procedure. If we do not reach you, we will leave a message.  We will attempt to reach you two times.  During this call, we will ask if you have developed any symptoms of COVID 19. If you develop any symptoms (ie: fever, flu-like symptoms, shortness of breath, cough etc.) before then, please call [phone].  If you test positive for Covid 19 in the 2 weeks post procedure, please call and report this information to [place].    If any biopsies were taken you will be contacted by phone or by letter within the next 1-3 weeks.  Please call us at [phone] if you have not heard about the biopsies in 3 weeks.    SIGNATURES/CONFIDENTIALITY: You and/or your care partner have signed paperwork which will be entered into your electronic medical record.  These signatures attest to the fact that that the information above on your After Visit Summary has been reviewed and is understood.  Full responsibility of the confidentiality of this discharge information lies with you and/or your care-partner.    Handouts were given to you on polyps and diverticulosis. Your blood sugar was 275 in the recovery room.  Please eat some breakfast  when you get home and take your Metformin after eating. You may resume your current medications today. Await biopsy results. Please call if any questions or concerns.

## 2018-12-02 ENCOUNTER — Telehealth: Payer: Self-pay

## 2018-12-02 NOTE — Telephone Encounter (Signed)
  Follow up Call-  Call back number 11/30/2018  Post procedure Call Back phone  # [phone]  Permission to leave phone message Yes  Some recent data might be hidden     Patient questions:  Do you have a fever, pain , or abdominal swelling? No. Pain Score  0 *  Have you tolerated food without any problems? Yes.    Have you been able to return to your normal activities? Yes.    Do you have any questions about your discharge instructions: Diet   No. Medications  No. Follow up visit  No.  Do you have questions or concerns about your Care? No.  Actions: * If pain score is 4 or above: No action needed, pain <4.  1. Have you developed a fever since your procedure? no  2.   Have you had an respiratory symptoms (SOB or cough) since your procedure? no  3.   Have you tested positive for COVID 19 since your procedure no  4.   Have you had any family members/close contacts diagnosed with the COVID 19 since your procedure?  no   If yes to any of these questions please route to Joylene John, RN and Alphonsa Gin, Therapist, sports.

## 2018-12-02 NOTE — Telephone Encounter (Signed)
  Follow up Call-  Call back number 11/30/2018  Post procedure Call Back phone  # [phone]  Permission to leave phone message Yes  Some recent data might be hidden     Patient questions:  Do you have a fever, pain , or abdominal swelling? No. Pain Score  0 *  Have you tolerated food without any problems? Yes.    Have you been able to return to your normal activities? Yes.    Do you have any questions about your discharge instructions: Diet   No. Medications  No. Follow up visit  No.  Do you have questions or concerns about your Care? No.  Actions: * If pain score is 4 or above: No action needed, pain <4.  1. Have you developed a fever since your procedure? no  2.   Have you had an respiratory symptoms (SOB or cough) since your procedure? no  3.   Have you tested positive for COVID 19 since your procedure? no  4.   Have you had any family members/close contacts diagnosed with the COVID 19 since your procedure?  no   If yes to any of these questions please route to Joylene John, RN and Alphonsa Gin, Therapist, sports.

## 2018-12-03 ENCOUNTER — Ambulatory Visit (INDEPENDENT_AMBULATORY_CARE_PROVIDER_SITE_OTHER): Payer: Medicare PPO | Admitting: Podiatry

## 2018-12-03 ENCOUNTER — Other Ambulatory Visit: Payer: Self-pay

## 2018-12-03 ENCOUNTER — Encounter: Payer: Self-pay | Admitting: Podiatry

## 2018-12-03 DIAGNOSIS — M722 Plantar fascial fibromatosis: Secondary | ICD-10-CM | POA: Diagnosis not present

## 2018-12-03 NOTE — Progress Notes (Signed)
Subjective:   Patient ID: Autumn Valenzuela, female   DOB: 58 y.o.   MRN: [id number]   HPI Patient presents stating that she has developed a lot of pain again in her right heel and states it is worse when she gets up in the morning or after periods of sitting and it is more in the arch and that it is the heel now   ROS      Objective:  Physical Exam  Neurovascular status intact with patient found to have inflammation distal to the insertion of the fascia calcaneus with moderate depression of the arch noted with pain upon deep palpation to the area     Assessment:  Acute plantar fasciitis right with inflammation fluid distal to insertion calcaneus     Plan:  H&P reviewed condition did sterile prep and injected the fascia 3 mg Kenalog 5 mg Xylocaine and advised this patient on anti-inflammatories physical therapy.  Patient will be seen back for [place] to recheck and had night splint dispensed with all instructions on usage currently

## 2018-12-08 ENCOUNTER — Telehealth: Payer: Self-pay

## 2018-12-08 NOTE — Telephone Encounter (Signed)
-----   Message from Garvin Fila, MD sent at 12/04/2018 12:40 PM EST ----- Kindly inform the patient that MRI scan of the cervical spine was normal

## 2018-12-08 NOTE — Telephone Encounter (Signed)
Notes recorded by Marval Regal, RN on 12/08/2018 at 3:41 PM EST  I called pts POA Mcquigg,Lapoeshea and stated MRI cervical spine was normal. The POA verbalized understanding.  ------

## 2018-12-16 ENCOUNTER — Telehealth: Payer: Self-pay | Admitting: *Deleted

## 2018-12-16 NOTE — Telephone Encounter (Addendum)
Patient left a voicemail stating that she has another yeast infection and wants to know if Tor Netters NP will refill the same medication that she gave her last time a cream and a pill?  Tried to call patient back to get more information and got her voicemail. Left a message for patient to call back.

## 2018-12-17 ENCOUNTER — Telehealth: Payer: Self-pay | Admitting: Family Medicine

## 2018-12-17 NOTE — Telephone Encounter (Signed)
Patient is requesting a call back  She stated it is in regards to her diabetes., Patient would not go into detail what exactly she was needing or questions.  She stated she would like to speak to her provider if she could get a call back

## 2018-12-18 ENCOUNTER — Other Ambulatory Visit: Payer: Self-pay | Admitting: Family Medicine

## 2018-12-18 DIAGNOSIS — N898 Other specified noninflammatory disorders of vagina: Secondary | ICD-10-CM

## 2018-12-18 MED ORDER — FLUCONAZOLE 150 MG PO TABS: 150.0000 mg | ORAL_TABLET | Freq: Once | ORAL | 0 refills | Status: AC

## 2018-12-18 NOTE — Telephone Encounter (Signed)
Called and spoke with patient. She reports blood sugars running high after Thanksgiving, coming down now. Were over 400. She is taking her insulin 14 units per evening. She was instructed to carefully watch diet and to increase insulin by 2 units when over 200. She has had vaginal "infection." Itching, burning. Has been applying triamcinolone cream from previous rash. She was advised to stop this. Diflucan sent in for presumptive yeast infection. Patient was counseled regarding follow up instructions- if blood sugars running over 200 after increasing insulin 5 days and / or continued vaginal symptoms.

## 2018-12-18 NOTE — Telephone Encounter (Signed)
Called patient, no answer. Left message, ok per DPR, that I would try her again later.

## 2018-12-23 ENCOUNTER — Other Ambulatory Visit: Payer: Self-pay | Admitting: Neurology

## 2018-12-24 ENCOUNTER — Other Ambulatory Visit: Payer: Self-pay

## 2018-12-24 MED ORDER — TIZANIDINE HCL 4 MG PO TABS: 4.0000 mg | ORAL_TABLET | Freq: Three times a day (TID) | ORAL | 2 refills | Status: DC

## 2018-12-28 ENCOUNTER — Telehealth: Payer: Self-pay

## 2018-12-28 NOTE — Telephone Encounter (Signed)
I spoke with pt's daughter (DPR done) and pt having rt heel pain that pt saw podiatrist for on 12/03/18. Pt received an injection in the heel and a boot and pt was advised if condition worsens or fails to improve to contact Dr Paulla Dolly. Pt got on phone and said she would call Dr Paulla Dolly and if needed would call Lindsey back. FYI to Glenda Chroman FNP.

## 2018-12-28 NOTE — Telephone Encounter (Signed)
Noted  

## 2018-12-28 NOTE — Telephone Encounter (Signed)
[facility] Night - Client TELEPHONE ADVICE RECORD [facility] Patient Name: SHAWNA NORRID Gender: Female DOB: Nov 26, 1960 Age: 58 Y 43 M 18 D Return Phone Number: [phone] (Primary) [place] [address] Client [facility] - Client Client Site [facility] Physician Tor Netters- NP Contact Type Call Who Is Calling Patient / Member / Family / Caregiver Call Type Triage / Clinical Relationship To Patient Self Return Phone Number [phone] (Primary) Chief Complaint Foot Pain Reason for Call Symptomatic / Request for Health Information Initial Comment Caller states she's a Diabetic. Type 2. Left heal is hurting real bad. Pain when she walks. Translation No Nurse Assessment Guidelines Guideline Title Affirmed Question Affirmed Notes Nurse Date/Time (Eastern Time) Disp. Time Eilene Ghazi Time) Disposition Final User 12/26/2018 5:10:56 PM Send To Nurse Dina Rich, RN, Todd 12/26/2018 5:55:28 PM FINAL ATTEMPT MADE - message left

## 2019-01-22 ENCOUNTER — Other Ambulatory Visit: Payer: Self-pay

## 2019-01-22 ENCOUNTER — Ambulatory Visit (INDEPENDENT_AMBULATORY_CARE_PROVIDER_SITE_OTHER): Payer: Medicare Other | Admitting: Podiatry

## 2019-01-22 ENCOUNTER — Encounter: Payer: Self-pay | Admitting: Podiatry

## 2019-01-22 DIAGNOSIS — M79674 Pain in right toe(s): Secondary | ICD-10-CM

## 2019-01-22 DIAGNOSIS — M79672 Pain in left foot: Secondary | ICD-10-CM | POA: Diagnosis not present

## 2019-01-22 DIAGNOSIS — M79675 Pain in left toe(s): Secondary | ICD-10-CM

## 2019-01-22 DIAGNOSIS — B351 Tinea unguium: Secondary | ICD-10-CM

## 2019-01-22 DIAGNOSIS — M79671 Pain in right foot: Secondary | ICD-10-CM

## 2019-01-22 DIAGNOSIS — R234 Changes in skin texture: Secondary | ICD-10-CM

## 2019-01-22 NOTE — Progress Notes (Signed)
Subjective: Autumn Valenzuela presents today for preventative diabetic foot. Patient is seen for follow up of painful, mycotic toenails which interfere with comfortable ambulation when wearing enclosed shoe gear. Pain is relieved with periodic professional debridement.  She relates tender posterior heels b/l. States they are cracked. She wears shoes with her heels exposed in the house. Denies any redness, open wound or drainage.  Medications reviewed in chart.  Allergies  Allergen Reactions  . Cefazolin Swelling and Other (See Comments)    Rapid iv infusion caused swelling and burning at the iv site    Objective: There were no vitals filed for this visit.  Vascular Examination: Capillary refill time immediate x 10 digits.  Dorsalis pedis present b/l.  Posterior tibial pulses present b/l.  Digital hair  present x 10 digits.  Skin temperature gradient WNL b/l.   Dermatological Examination: Skin with normal turgor, texture and tone b/l.  Toenails 1-5 b/l discolored, thick, dystrophic with subungual debris and pain with palpation to nailbeds due to thickness of nails.  Posterior heels with noted superficial longitudinal cracks. No erythema, no edema, no drainage, no flocculence. Resembles early fissure formation b/l.   Musculoskeletal: Muscle strength 5/5 b/l to all LE muscle groups.  Gross bony deformities:  None.  No pain, crepitus or joint limitation with passive/active ROM b/l.  Neurological Examination: Protective sensation intact 5/5 b/l with 10 gram monofilament.  Vibratory sensation intact bilaterally.   Assessment: 1. Painful onychomycosis toenails 1-5 b/l 2. Heel fissures b/l 3. NIDDM  Plan: 1. Continue diabetic foot care principles. Literature dispensed on today. 2. Toenails 1-5 b/l were debrided in length and girth without iatrogenic bleeding. 3. Discussed fissuring of heels with patient. Applied triple antibiotic ointment to heels. Recommended she use  moisturizing lotion and Vaseline Petroleum Jelly after application of the lotion. For now, perform twice daily. May decrease to once daily once her symptoms resolve. 4. Patient to continue soft, supportive shoe gear. 5. Patient to report any pedal injuries to medical professional. 6. Follow up 3 months.  7. Patient/POA to call should there be a concern in the interim.

## 2019-01-22 NOTE — Patient Instructions (Signed)
Diabetes Mellitus and Foot Care Foot care is an important part of your health, especially when you have diabetes. Diabetes may cause you to have problems because of poor blood flow (circulation) to your feet and legs, which can cause your skin to:  Become thinner and drier.  Break more easily.  Heal more slowly.  Peel and crack. You may also have nerve damage (neuropathy) in your legs and feet, causing decreased feeling in them. This means that you may not notice minor injuries to your feet that could lead to more serious problems. Noticing and addressing any potential problems early is the best way to prevent future foot problems. How to care for your feet Foot hygiene  Wash your feet daily with warm water and mild soap. Do not use hot water. Then, pat your feet and the areas between your toes until they are completely dry. Do not soak your feet as this can dry your skin.  Trim your toenails straight across. Do not dig under them or around the cuticle. File the edges of your nails with an emery board or nail file.  Apply a moisturizing lotion or petroleum jelly to the skin on your feet and to dry, brittle toenails. Use lotion that does not contain alcohol and is unscented. Do not apply lotion between your toes. Shoes and socks  Wear clean socks or stockings every day. Make sure they are not too tight. Do not wear knee-high stockings since they may decrease blood flow to your legs.  Wear shoes that fit properly and have enough cushioning. Always look in your shoes before you put them on to be sure there are no objects inside.  To break in new shoes, wear them for just a few hours a day. This prevents injuries on your feet. Wounds, scrapes, corns, and calluses  Check your feet daily for blisters, cuts, bruises, sores, and redness. If you cannot see the bottom of your feet, use a mirror or ask someone for help.  Do not cut corns or calluses or try to remove them with medicine.  If you  find a minor scrape, cut, or break in the skin on your feet, keep it and the skin around it clean and dry. You may clean these areas with mild soap and water. Do not clean the area with peroxide, alcohol, or iodine.  If you have a wound, scrape, corn, or callus on your foot, look at it several times a day to make sure it is healing and not infected. Check for: ? Redness, swelling, or pain. ? Fluid or blood. ? Warmth. ? Pus or a bad smell. General instructions  Do not cross your legs. This may decrease blood flow to your feet.  Do not use heating pads or hot water bottles on your feet. They may burn your skin. If you have lost feeling in your feet or legs, you may not know this is happening until it is too late.  Protect your feet from hot and cold by wearing shoes, such as at the beach or on hot pavement.  Schedule a complete foot exam at least once a year (annually) or more often if you have foot problems. If you have foot problems, report any cuts, sores, or bruises to your health care provider immediately. Contact a health care provider if:  You have a medical condition that increases your risk of infection and you have any cuts, sores, or bruises on your feet.  You have an injury that is not   healing.  You have redness on your legs or feet.  You feel burning or tingling in your legs or feet.  You have pain or cramps in your legs and feet.  Your legs or feet are numb.  Your feet always feel cold.  You have pain around a toenail. Get help right away if:  You have a wound, scrape, corn, or callus on your foot and: ? You have pain, swelling, or redness that gets worse. ? You have fluid or blood coming from the wound, scrape, corn, or callus. ? Your wound, scrape, corn, or callus feels warm to the touch. ? You have pus or a bad smell coming from the wound, scrape, corn, or callus. ? You have a fever. ? You have a red line going up your leg. Summary  Check your feet every day  for cuts, sores, red spots, swelling, and blisters.  Moisturize feet and legs daily.  Wear shoes that fit properly and have enough cushioning.  If you have foot problems, report any cuts, sores, or bruises to your health care provider immediately.  Schedule a complete foot exam at least once a year (annually) or more often if you have foot problems. This information is not intended to replace advice given to you by your health care provider. Make sure you discuss any questions you have with your health care provider. Document Revised: 09/23/2018 Document Reviewed: 02/02/2016 Elsevier Patient Education  2020 Elsevier Inc.  

## 2019-01-24 ENCOUNTER — Ambulatory Visit (HOSPITAL_COMMUNITY)
Admission: EM | Admit: 2019-01-24 | Discharge: 2019-01-24 | Disposition: A | Discharge status: Home / Self Care | Payer: Medicare Other | Attending: Family Medicine | Admitting: Family Medicine

## 2019-01-24 ENCOUNTER — Encounter (HOSPITAL_COMMUNITY): Payer: Self-pay | Admitting: Family Medicine

## 2019-01-24 ENCOUNTER — Other Ambulatory Visit: Payer: Self-pay

## 2019-01-24 ENCOUNTER — Ambulatory Visit (INDEPENDENT_AMBULATORY_CARE_PROVIDER_SITE_OTHER): Payer: Medicare Other

## 2019-01-24 DIAGNOSIS — M19011 Primary osteoarthritis, right shoulder: Secondary | ICD-10-CM

## 2019-01-24 MED ORDER — PREDNISONE 10 MG (21) PO TBPK: ORAL_TABLET | ORAL | 0 refills | Status: DC

## 2019-01-24 NOTE — Discharge Instructions (Signed)
Your x-ray did not show anything acute to include dislocations or fractures. Most likely shoulder strain with arthritis. We will treat this with prednisone taper over the next 6 days.  Make sure you take this with food. Be sure that you are checking your blood sugars regularly due to the this medication increase in blood sugars. Rest, ice and wear the sling as needed. Be sure to take the shoulder out of the sling periodically to do range of motion to avoid frozen shoulder. Follow up as needed for continued or worsening symptoms

## 2019-01-24 NOTE — ED Triage Notes (Signed)
Pt reports injuring right shoulder yesterday when trying to swing a trash bag into a receptacle.  C/O significant pain right shoulder with decreased ROM due to pain.  RUE CMS intact.

## 2019-01-25 NOTE — ED Provider Notes (Signed)
[place]    CSN: [id number] Arrival date & time: 01/24/19  1151      History   Chief Complaint Chief Complaint  Patient presents with  . Appointment    1200  . Shoulder Pain    HPI Autumn Valenzuela is a 59 y.o. female.   Pt is a 59 year old female that presents with right shoulder pain. This started after injury. Symptoms have been constant.  Yesterday she was trying to swing a trash bag into a can when the pain started. Decreased ROM. No swelling, numbness, tingling. She took ibuprofen for the pain with some relief. No fever.   ROS per HPI    Shoulder Pain   Past Medical History:  Diagnosis Date  . Allergy   . Anemia   . Anxiety   . Arthritis   . Back pain   . Bipolar disorder ([place])   . CVA (cerebral infarction)   . Depression   . Diabetes mellitus without complication ([place])   . High cholesterol   . Hypertension   . Left leg pain   . Migraine   . Sleep apnea   . Stroke [facility])     Patient Active Problem List   Diagnosis Date Noted  . Acute pain of both knees 06/18/2018  . Radiculopathy 02/11/2018  . Bipolar disorder ([place]) 03/29/2016  . Diabetic ketoacidosis ([place]) 03/24/2016  . Dermatitis 03/24/2016  . HTN (hypertension) 03/24/2016  . HLD (hyperlipidemia) 03/24/2016  . DM (diabetes mellitus) ([place]) 03/24/2016  . Hyperglycemia   . Chronic daily headache 11/23/2012  . 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 Depression 11/23/2012    Past Surgical History:  Procedure Laterality Date  . ABDOMINAL HYSTERECTOMY    . ANTERIOR LATERAL LUMBAR FUSION WITH PERCUTANEOUS SCREW 1 LEVEL Left 02/11/2018   Procedure: LEFT LATERAL LUMBAR  FOUR-FIVE INTERBODY FUSION WITH INSTRUMENTATION AND ALLOGRAFT;  Surgeon: Phylliss Bob, MD;  Location: [facility];  Service: Orthopedics;  Laterality: Left;  LEFT LATERAL LUMBAR  FOUR-FIVE INTERBODY FUSION WITH INSTRUMENTATION AND ALLOGRAFT  . BACK SURGERY     Lumbar fusion L5-S1  . DIABETES      . EYE SURGERY Bilateral   . KNEE SURGERY    . l 4-l5 status post lateral posterior fusion January 30,2020    . NEUROFORAMINAL STENOSIS Left    Severe L4-L5, involving the level above previous fusion.  Marland Kitchen PARTIAL HYSTERECTOMY    . RADICULOPATHY Left    L4, Secondary to an L4-L5 Spondylolisthesis  . SHOULDER SURGERY      OB History   No obstetric history on file.      Home Medications    Prior to Admission medications   Medication Sig Start Date End Date Taking? Authorizing Provider  ALPRAZolam Duanne Moron) 0.5 MG tablet Take 1 tablet (0.5 mg total) by mouth daily as needed for anxiety. 06/22/18  Yes Elby Beck, FNP  amitriptyline (ELAVIL) 25 MG tablet TAKE 1 TABLET(25 MG) BY MOUTH AT BEDTIME 07/15/18  Yes Elby Beck, FNP  atorvastatin (LIPITOR) 20 MG tablet Take 1 tablet (20 mg total) by mouth daily. 10/08/18  Yes Elby Beck, FNP  calcium-vitamin D (OSCAL WITH D) 500-200 MG-UNIT per tablet Take 1 tablet by mouth daily with breakfast.   Yes [provider]  DULoxetine (CYMBALTA) 60 MG capsule Take 1 capsule (60 mg total) by mouth at bedtime. 07/15/18  Yes Elby Beck, FNP  Insulin Glargine (LANTUS) 100 UNIT/ML Solostar Pen Inject 10 Units  into the skin daily. If blood sugar over 200, increase by 2 units up to 40 units. 01/15/18  Yes Elby Beck, FNP  ketorolac (ACULAR) 0.4 % SOLN Place 1 drop into both eyes daily as needed (eye irritation).  04/08/16  Yes [provider]  levocetirizine (XYZAL) 5 MG tablet Take 1 tablet (5 mg total) by mouth every evening. 04/20/18  Yes Elby Beck, FNP  meloxicam (MOBIC) 15 MG tablet One tablet every 24 hours as needed for inflammation. 07/15/18  Yes Elby Beck, FNP  metFORMIN (GLUCOPHAGE-XR) 500 MG 24 hr tablet Take 1 tablet (500 mg total) by mouth 2 (two) times daily with a meal. 08/28/18  Yes Elby Beck, FNP  tiZANidine (ZANAFLEX) 4 MG tablet Take 1 tablet (4 mg total) by mouth 3 (three)  times daily. Start 4 mg at night x 1 week then twice daily x 1 week and then three times daily 12/24/18 12/24/19 Yes Garvin Fila, MD  topiramate (TOPAMAX) 100 MG tablet Take 1 tablet (100 mg total) by mouth 2 (two) times daily. 10/13/17  Yes Elby Beck, FNP  Vitamin D, Ergocalciferol, (DRISDOL) 50000 units CAPS capsule Take 1 capsule (50,000 Units total) by mouth every 7 (seven) days. 08/25/17  Yes Elby Beck, FNP  Accu-Chek FastClix Lancets MISC TEST 4 TIMES DAILY. DX E11.9 07/16/18   Elby Beck, FNP  albuterol (PROVENTIL HFA;VENTOLIN HFA) 108 (90 Base) MCG/ACT inhaler Inhale 2 puffs into the lungs daily as needed for wheezing or shortness of breath. 10/30/16   Elby Beck, FNP  albuterol (PROVENTIL) (2.5 MG/3ML) 0.083% nebulizer solution Take 2.5 mg by nebulization at bedtime as needed for wheezing.    [provider]  Blood Glucose Calibration (ACCU-CHEK GUIDE CONTROL) LIQD 1 each by In Vitro route every 30 (thirty) days. Needs Accu-check guide monitor system. DX. E11.9 07/15/18   Elby Beck, FNP  fluconazole (DIFLUCAN) 150 MG tablet  12/18/18   [provider]  fluticasone (FLONASE) 50 MCG/ACT nasal spray Place 2 sprays into both nostrils daily. Patient taking differently: Place 2 sprays into both nostrils daily as needed for allergies.  03/31/16   Julianne Rice, MD  gabapentin (NEURONTIN) 300 MG capsule 1 capsule twice dailty 10/20/18   Garvin Fila, MD  glucose blood (ACCU-CHEK GUIDE) test strip Use to test blood sugar 4 times a day.  Dx: E11.9 07/16/18   Elby Beck, FNP  Insulin Pen Needle 31G X 5 MM MISC 1 Units by Does not apply route at bedtime. 01/15/18   Elby Beck, FNP  loratadine (CLARITIN) 10 MG tablet Take 1 tablet (10 mg total) by mouth daily. Patient taking differently: Take 10 mg by mouth daily as needed for allergies.  03/31/16   Julianne Rice, MD  ondansetron (ZOFRAN-ODT) 8 MG disintegrating tablet Take 1  tablet (8 mg total) by mouth every 8 (eight) hours as needed for nausea. 10/15/17   Elby Beck, FNP  predniSONE (STERAPRED UNI-PAK 21 TAB) 10 MG (21) TBPK tablet 6 tabs for 1 day, then 5 tabs for 1 das, then 4 tabs for 1 day, then 3 tabs for 1 day, 2 tabs for 1 day, then 1 tab for 1 day 01/24/19   Loura Halt A, NP  triamcinolone ointment (KENALOG) 0.1 % Apply topically 2 (two) times daily. For no more than 10 days. 06/22/18   Elby Beck, FNP    Family History Family History  Problem Relation Age of Onset  .  Diabetes Mother   . Stroke Father   . Cancer Paternal Aunt   . Cancer Paternal Aunt   . Diabetes Brother   . Colon cancer Neg Hx   . Esophageal cancer Neg Hx   . Rectal cancer Neg Hx   . Stomach cancer Neg Hx     Social History Social History   Tobacco Use  . Smoking status: Former Smoker    Quit date: 12/13/2015    Years since quitting: 3.1  . Smokeless tobacco: Never Used  Substance Use Topics  . Alcohol use: No  . Drug use: Yes    Types: Marijuana    Comment: last weed use 26 Dec 2018     Allergies   Cefazolin   Review of Systems Review of Systems   Physical Exam Triage Vital Signs ED Triage Vitals  Enc Vitals Group     BP 01/24/19 1240 105/69     Pulse Rate 01/24/19 1240 85     Resp 01/24/19 1240 16     Temp 01/24/19 1240 98.2 F (36.8 C)     Temp Source 01/24/19 1240 Oral     SpO2 01/24/19 1240 97 %     Weight --      Height --      Head Circumference --      Peak Flow --      Pain Score 01/24/19 1241 9     Pain Loc --      Pain Edu? --      Excl. in [place]? --    No data found.  Updated Vital Signs BP 105/69   Pulse 85   Temp 98.2 F (36.8 C) (Oral)   Resp 16   SpO2 97%   Visual Acuity Right Eye Distance:   Left Eye Distance:   Bilateral Distance:    Right Eye Near:   Left Eye Near:    Bilateral Near:     Physical Exam Vitals and nursing note reviewed.  Constitutional:      General: She is not in acute distress.     Appearance: Normal appearance. She is not ill-appearing, toxic-appearing or diaphoretic.  HENT:     Head: Normocephalic.     Nose: Nose normal.     Mouth/Throat:     Pharynx: Oropharynx is clear.  Eyes:     Conjunctiva/sclera: Conjunctivae normal.  Pulmonary:     Effort: Pulmonary effort is normal.  Musculoskeletal:     Right shoulder: Tenderness present. No swelling, deformity, effusion, laceration, bony tenderness or crepitus. Decreased range of motion. Normal strength. Normal pulse.     Right upper arm: Tenderness present. No swelling, edema, deformity, lacerations or bony tenderness.       Arms:     Cervical back: Normal range of motion.  Skin:    General: Skin is warm and dry.     Findings: No rash.  Neurological:     Mental Status: She is alert.  Psychiatric:        Mood and Affect: Mood normal.      UC Treatments / Results  Labs (all labs ordered are listed, but only abnormal results are displayed) Labs Reviewed - No data to display  EKG   Radiology DG Shoulder Right  Result Date: 01/24/2019 CLINICAL DATA:  Shoulder pain after injury. EXAM: RIGHT SHOULDER - 2+ VIEW COMPARISON:  None. FINDINGS: AC joint degenerative changes are noted. No fracture or dislocation. Mild degenerative changes in the shoulder. IMPRESSION: Mild degenerative changes in the  shoulder and AC joint. Electronically Signed   By: Dorise Bullion III M.D   On: 01/24/2019 12:55    Procedures Procedures (including critical care time)  Medications Ordered in UC Medications - No data to display  Initial Impression / Assessment and Plan / UC Course  I have reviewed the triage vital signs and the nursing notes.  Pertinent labs & imaging results that were available during my care of the patient were reviewed by me and considered in my medical decision making (see chart for details).     OA- x ray normal  Treating with prednisone  RICE and sling as needed  Warned about frozen shoulder.    Follow up as needed for continued or worsening symptoms  Final Clinical Impressions(s) / UC Diagnoses   Final diagnoses:  Osteoarthritis of right shoulder, unspecified osteoarthritis type     Discharge Instructions     Your x-ray did not show anything acute to include dislocations or fractures. Most likely shoulder strain with arthritis. We will treat this with prednisone taper over the next 6 days.  Make sure you take this with food. Be sure that you are checking your blood sugars regularly due to the this medication increase in blood sugars. Rest, ice and wear the sling as needed. Be sure to take the shoulder out of the sling periodically to do range of motion to avoid frozen shoulder. Follow up as needed for continued or worsening symptoms    ED Prescriptions    Medication Sig Dispense Auth. Provider   predniSONE (STERAPRED UNI-PAK 21 TAB) 10 MG (21) TBPK tablet 6 tabs for 1 day, then 5 tabs for 1 das, then 4 tabs for 1 day, then 3 tabs for 1 day, 2 tabs for 1 day, then 1 tab for 1 day 21 tablet Orene Abbasi A, NP     PDMP not reviewed this encounter.   Orvan July, NP 01/25/19 1736

## 2019-01-26 NOTE — Progress Notes (Addendum)
[facility]    Provider:  Dr Jaynee Eagles Requesting Provider: Antony Contras, MD Primary Care Provider:  Elby Beck, FNP  CC:  headaches  HPI:  Autumn Valenzuela is a 59 y.o. female here as requested by my colleague Dr. Leonie Man for headaches. PMHx cerebrovascular disease, hypertension, diabetes, diabetic ketoacidosis, radiculopathy, chronic daily headache, depression, hyperlipidemia, bipolar disorder, hypertriglyceridemia.  Patient was last seen by my colleague in October 2020, she states that she has a lifelong history of migraines but following a stroke in 2007 the headaches had progressed to chronic daily headaches with a component of analgesic rebound and muscle tension.  She was also a patient of ours in 2015, she was started on Elavil, she was referred for polysomnogram for sleep apnea but that never happened, the headaches are mostly in the top and initially to the back of the head and neck as well as down her shoulders into her arm, constant daily, 24 hours 7 days a week, she finds it difficult to sleep, she has to sleep with a specific position, Flexeril at night may help, she is taking gabapentin, Topamax.  Her blood pressure is in good control, she also states her sugars are also well controlled, she is on Lipitor, she does have slight anemia.  Dr. Leonie Man checked an MRI scan of the brain, increased her Zanaflex to 3 times daily, increased her gabapentin dose to 300 mg twice daily and discontinue Flexeril.  Headaches are every single day, migraines are every single day, continuous, all over the head, she wakes up with it, noise is too much, laughing hurts, light sensitivity, sound sensitivity, nausea. She uses pillows at night. Daughter is here and provides much information. She wakes a lot overnight, she watches her grandchildren all day. She is exhausted all day. Never feels refreshed. She tries to sleep all day. When she lays down it is around 230 and sleeps every day.  She also has insomnia. She is constantly thinking of things, this keeps her up. She snores heavily, daughter can hear her, she wakes to urinate often. She wakes up with dry mouth. No aura. No medication overuse. Failed multiple medications. She had a sleep study years ago > 5 years ago, we have reviewed records in [place] and old system and cannot find it. Headaches are pulsating and pounding, nausea, movement makes it worse, photo/phonophobia, daily for 24 hours for > 1 year.  Reviewed notes, labs and imaging from outside physicians, which showed:  I reviewed MRI of the brain images and agree with chronic bilateral cerebellar ischemic infarctions otherwise no acute findings.  MRI of the cervical spine was normal.  Review of Systems: Patient complains of symptoms per HPI as well as the following symptoms: fatigue. Pertinent negatives and positives per HPI. All others negative.   Social History   Socioeconomic History  . Marital status: Single    Spouse name: Not on file  . Number of children: 3  . Years of education: 49  . Highest education level: Not on file  Occupational History  . Not on file  Tobacco Use  . Smoking status: Former Smoker    Quit date: 12/13/2015    Years since quitting: 3.1  . Smokeless tobacco: Never Used  Substance and Sexual Activity  . Alcohol use: No  . Drug use: Yes    Types: Marijuana    Comment: last weed use 26 Dec 2018  . Sexual activity: Yes    Birth control/protection: None  Other Topics Concern  .  Not on file  Social History Narrative   Patient is single with 3 children.   Patient is right handed.   Patient has a high school education.   Patient drinks at least 3 cups of caffeine daily.   Social Determinants of Health   Financial Resource Strain:   . Difficulty of Paying Living Expenses: Not on file  Food Insecurity:   . Worried About Charity fundraiser in the Last Year: Not on file  . Ran Out of Food in the Last Year: Not on file    Transportation Needs:   . Lack of Transportation (Medical): Not on file  . Lack of Transportation (Non-Medical): Not on file  Physical Activity:   . Days of Exercise per Week: Not on file  . Minutes of Exercise per Session: Not on file  Stress:   . Feeling of Stress : Not on file  Social Connections:   . Frequency of Communication with Friends and Family: Not on file  . Frequency of Social Gatherings with Friends and Family: Not on file  . Attends Religious Services: Not on file  . Active Member of Clubs or Organizations: Not on file  . Attends Archivist Meetings: Not on file  . Marital Status: Not on file  Intimate Partner Violence:   . Fear of Current or Ex-Partner: Not on file  . Emotionally Abused: Not on file  . Physically Abused: Not on file  . Sexually Abused: Not on file    Family History  Problem Relation Age of Onset  . Diabetes Mother   . Stroke Father   . Cancer Paternal Aunt   . Cancer Paternal Aunt   . Diabetes Brother   . Colon cancer Neg Hx   . Esophageal cancer Neg Hx   . Rectal cancer Neg Hx   . Stomach cancer Neg Hx     Past Medical History:  Diagnosis Date  . Allergy   . Anemia   . Anxiety   . Arthritis   . Back pain   . Bipolar disorder ([place])   . CVA (cerebral infarction)   . Depression   . Diabetes mellitus without complication ([place])   . High cholesterol   . Hypertension   . Left leg pain   . Migraine   . Sleep apnea   . Stroke [facility])     Patient Active Problem List   Diagnosis Date Noted  . Chronic migraine without aura, with intractable migraine, so stated, with status migrainosus 01/27/2019  . Acute pain of both knees 06/18/2018  . Radiculopathy 02/11/2018  . Bipolar disorder ([place]) 03/29/2016  . Diabetic ketoacidosis ([place]) 03/24/2016  . Dermatitis 03/24/2016  . HTN (hypertension) 03/24/2016  . HLD (hyperlipidemia) 03/24/2016  . DM (diabetes mellitus) ([place]) 03/24/2016  . Hyperglycemia   . Chronic daily headache  11/23/2012  . 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 Depression 11/23/2012    Past Surgical History:  Procedure Laterality Date  . ABDOMINAL HYSTERECTOMY    . ANTERIOR LATERAL LUMBAR FUSION WITH PERCUTANEOUS SCREW 1 LEVEL Left 02/11/2018   Procedure: LEFT LATERAL LUMBAR  FOUR-FIVE INTERBODY FUSION WITH INSTRUMENTATION AND ALLOGRAFT;  Surgeon: Phylliss Bob, MD;  Location: [facility];  Service: Orthopedics;  Laterality: Left;  LEFT LATERAL LUMBAR  FOUR-FIVE INTERBODY FUSION WITH INSTRUMENTATION AND ALLOGRAFT  . BACK SURGERY     Lumbar fusion L5-S1  . DIABETES    . EYE SURGERY Bilateral   . KNEE SURGERY    . l  4-l5 status post lateral posterior fusion January 30,2020    . NEUROFORAMINAL STENOSIS Left    Severe L4-L5, involving the level above previous fusion.  Marland Kitchen PARTIAL HYSTERECTOMY    . RADICULOPATHY Left    L4, Secondary to an L4-L5 Spondylolisthesis  . SHOULDER SURGERY      Current Outpatient Medications  Medication Sig Dispense Refill  . Accu-Chek FastClix Lancets MISC TEST 4 TIMES DAILY. DX E11.9 306 each 3  . albuterol (PROVENTIL HFA;VENTOLIN HFA) 108 (90 Base) MCG/ACT inhaler Inhale 2 puffs into the lungs daily as needed for wheezing or shortness of breath. 18 g 2  . albuterol (PROVENTIL) (2.5 MG/3ML) 0.083% nebulizer solution Take 2.5 mg by nebulization at bedtime as needed for wheezing.    Marland Kitchen ALPRAZolam (XANAX) 0.5 MG tablet Take 1 tablet (0.5 mg total) by mouth daily as needed for anxiety. 30 tablet 2  . amitriptyline (ELAVIL) 25 MG tablet TAKE 1 TABLET(25 MG) BY MOUTH AT BEDTIME 90 tablet 1  . atorvastatin (LIPITOR) 20 MG tablet Take 1 tablet (20 mg total) by mouth daily. 90 tablet 3  . Blood Glucose Calibration (ACCU-CHEK GUIDE CONTROL) LIQD 1 each by In Vitro route every 30 (thirty) days. Needs Accu-check guide monitor system. DX. E11.9 3 each 3  . calcium-vitamin D (OSCAL WITH D) 500-200 MG-UNIT per tablet Take 1 tablet by mouth daily with breakfast.     . DULoxetine (CYMBALTA) 60 MG capsule Take 1 capsule (60 mg total) by mouth at bedtime. 90 capsule 1  . fluconazole (DIFLUCAN) 150 MG tablet     . fluticasone (FLONASE) 50 MCG/ACT nasal spray Place 2 sprays into both nostrils daily. (Patient taking differently: Place 2 sprays into both nostrils daily as needed for allergies. ) 16 g 0  . gabapentin (NEURONTIN) 300 MG capsule 1 capsule twice dailty 60 capsule 1  . glucose blood (ACCU-CHEK GUIDE) test strip Use to test blood sugar 4 times a day.  Dx: E11.9 400 each 3  . Insulin Glargine (LANTUS) 100 UNIT/ML Solostar Pen Inject 10 Units into the skin daily. If blood sugar over 200, increase by 2 units up to 40 units. 15 mL 1  . Insulin Pen Needle 31G X 5 MM MISC 1 Units by Does not apply route at bedtime. 90 each 3  . ketorolac (ACULAR) 0.4 % SOLN Place 1 drop into both eyes daily as needed (eye irritation).     Marland Kitchen levocetirizine (XYZAL) 5 MG tablet Take 1 tablet (5 mg total) by mouth every evening. 30 tablet 2  . loratadine (CLARITIN) 10 MG tablet Take 1 tablet (10 mg total) by mouth daily. (Patient taking differently: Take 10 mg by mouth daily as needed for allergies. ) 30 tablet 0  . meloxicam (MOBIC) 15 MG tablet One tablet every 24 hours as needed for inflammation. 90 tablet 0  . metFORMIN (GLUCOPHAGE-XR) 500 MG 24 hr tablet Take 1 tablet (500 mg total) by mouth 2 (two) times daily with a meal. 180 tablet 1  . ondansetron (ZOFRAN-ODT) 8 MG disintegrating tablet Take 1 tablet (8 mg total) by mouth every 8 (eight) hours as needed for nausea. 20 tablet 0  . predniSONE (STERAPRED UNI-PAK 21 TAB) 10 MG (21) TBPK tablet 6 tabs for 1 day, then 5 tabs for 1 das, then 4 tabs for 1 day, then 3 tabs for 1 day, 2 tabs for 1 day, then 1 tab for 1 day 21 tablet 0  . tiZANidine (ZANAFLEX) 4 MG tablet Take 1 tablet (4  mg total) by mouth 3 (three) times daily. Start 4 mg at night x 1 week then twice daily x 1 week and then three times daily 90 tablet 2  . topiramate  (TOPAMAX) 100 MG tablet Take 1 tablet (100 mg total) by mouth 2 (two) times daily. 60 tablet 2  . triamcinolone ointment (KENALOG) 0.1 % Apply topically 2 (two) times daily. For no more than 10 days. 30 g 1  . Vitamin D, Ergocalciferol, (DRISDOL) 50000 units CAPS capsule Take 1 capsule (50,000 Units total) by mouth every 7 (seven) days. 12 capsule 3   No current facility-administered medications for this visit.    Allergies as of 01/27/2019 - Review Complete 01/27/2019  Allergen Reaction Noted  . Cefazolin Swelling and Other (See Comments) 07/09/2011    Vitals: BP 98/66 (BP Location: Right Arm, Patient Position: Sitting)   Pulse 80   Temp (!) 96.3 F (35.7 C) Comment: guests 97 & 97.8; both taken at front  Ht 5\' 1"  (1.549 m)   Wt 208 lb (94.3 kg)   BMI 39.30 kg/m  Last Weight:  Wt Readings from Last 1 Encounters:  01/27/19 208 lb (94.3 kg)   Last Height:   Ht Readings from Last 1 Encounters:  01/27/19 5\' 1"  (1.549 m)     Neuro: Detailed Neurologic Exam  Speech:    Speech is normal; fluent and spontaneous with normal comprehension.  Cognition:    The patient is oriented to person, place, and time;    Cranial Nerves:    The pupils are equal, round, and reactive to light. Visual fields are full to finger confrontation. Extraocular movements are intact. Trigeminal sensation is intact and the muscles of mastication are normal. The face is symmetric. The palate elevates in the midline. Hearing intact. Voice is normal. Shoulder shrug is normal. The tongue has normal motion without fasciculations.   Motor Observation:    No asymmetry, no atrophy, and no involuntary movements noted. Tone:    Normal muscle tone.    Posture:    Posture is normal. normal erect    Strength:    Strength is V/V in the upper and lower limbs.      Sensation: intact to LT       Assessment/Plan:  Chronic daily migraines, memory loss, excessive daytime fatigue, obesity, hx of stroked  Needs  sleep evaluation: Obesity, hx of strokes, excessive daytime somnolence, memory loss, morning headaches, dry mouth in morning, snores heavily. Had a sleep study in 2007, 13 years ago, see report. ESS 13, FSS 54. Needs repeat sleep evaluation.   Botox for migraines: Failed multiple medications, will initiate botox. (failed elavil(amitriptyline), tizanidine, cymbalta, neurontin, topamax, verapamil, fluoxetine, and others). Will try to start decreasing her polypharmacy when initiate botox, may also consider Ajovy if needed.  Memory loss: May have sleep apnea, also polypharmacy especially multiple AEDs and TCA. Will try and decrease if headaches better with botox and sleep apnea eval/treatment  Orders Placed This Encounter  Procedures  . Ambulatory referral to Sleep Studies   Cc: Elby Beck, FNP,    Sarina Ill, MD  [facility] [address] [place], [place] [address]  Phone [phone] Fax [phone]  A total of 70 minutes was spent face-to-face with this patient. Over half this time was spent on counseling patient on the  1. Chronic migraine without aura, with intractable migraine, so stated, with status migrainosus   2. Morning headache   3. Excessive daytime sleepiness  diagnosis and different diagnostic and therapeutic options, counseling and coordination of care, risks ans benefits of management, compliance, or risk factor reduction and education.

## 2019-01-27 ENCOUNTER — Telehealth: Payer: Self-pay | Admitting: Neurology

## 2019-01-27 ENCOUNTER — Ambulatory Visit (INDEPENDENT_AMBULATORY_CARE_PROVIDER_SITE_OTHER): Payer: Medicare Other | Admitting: Neurology

## 2019-01-27 ENCOUNTER — Encounter: Payer: Self-pay | Admitting: Neurology

## 2019-01-27 ENCOUNTER — Other Ambulatory Visit: Payer: Self-pay

## 2019-01-27 VITALS — BP 98/66 | HR 80 | Temp 96.3°F | Ht 61.0 in | Wt 208.0 lb

## 2019-01-27 DIAGNOSIS — G4719 Other hypersomnia: Secondary | ICD-10-CM

## 2019-01-27 DIAGNOSIS — G43711 Chronic migraine without aura, intractable, with status migrainosus: Secondary | ICD-10-CM | POA: Insufficient documentation

## 2019-01-27 DIAGNOSIS — R519 Headache, unspecified: Secondary | ICD-10-CM

## 2019-01-27 NOTE — Telephone Encounter (Signed)
Autumn Valenzuela, Please initiate botox for migraine, call patient and explain process and set up appointment with Amy please

## 2019-01-27 NOTE — Telephone Encounter (Signed)
Autumn Valenzuela, set up patient for botox with me and not amy, sorry

## 2019-01-27 NOTE — Patient Instructions (Signed)
Sleep Apnea Sleep apnea affects breathing during sleep. It causes breathing to stop for a short time or to become shallow. It can also increase the risk of:  Heart attack.  Stroke.  Being very overweight (obese).  Diabetes.  Heart failure.  Irregular heartbeat. The goal of treatment is to help you breathe normally again. What are the causes? There are three kinds of sleep apnea:  Obstructive sleep apnea. This is caused by a blocked or collapsed airway.  Central sleep apnea. This happens when the brain does not send the right signals to the muscles that control breathing.  Mixed sleep apnea. This is a combination of obstructive and central sleep apnea. The most common cause of this condition is a collapsed or blocked airway. This can happen if:  Your throat muscles are too relaxed.  Your tongue and tonsils are too large.  You are overweight.  Your airway is too small. What increases the risk?  Being overweight.  Smoking.  Having a small airway.  Being older.  Being female.  Drinking alcohol.  Taking medicines to calm yourself (sedatives or tranquilizers).  Having family members with the condition. What are the signs or symptoms?  Trouble staying asleep.  Being sleepy or tired during the day.  Getting angry a lot.  Loud snoring.  Headaches in the morning.  Not being able to focus your mind (concentrate).  Forgetting things.  Less interest in sex.  Mood swings.  Personality changes.  Feelings of sadness (depression).  Waking up a lot during the night to pee (urinate).  Dry mouth.  Sore throat. How is this diagnosed?  Your medical history.  A physical exam.  A test that is done when you are sleeping (sleep study). The test is most often done in a sleep lab but may also be done at home. How is this treated?   Sleeping on your side.  Using a medicine to get rid of mucus in your nose (decongestant).  Avoiding the use of alcohol,  medicines to help you relax, or certain pain medicines (narcotics).  Losing weight, if needed.  Changing your diet.  Not smoking.  Using a machine to open your airway while you sleep, such as: ? An oral appliance. This is a mouthpiece that shifts your lower jaw forward. ? A CPAP device. This device blows air through a mask when you breathe out (exhale). ? An EPAP device. This has valves that you put in each nostril. ? A BPAP device. This device blows air through a mask when you breathe in (inhale) and breathe out.  Having surgery if other treatments do not work. It is important to get treatment for sleep apnea. Without treatment, it can lead to:  High blood pressure.  Coronary artery disease.  In men, not being able to have an erection (impotence).  Reduced thinking ability. Follow these instructions at home: Lifestyle  Make changes that your doctor recommends.  Eat a healthy diet.  Lose weight if needed.  Avoid alcohol, medicines to help you relax, and some pain medicines.  Do not use any products that contain nicotine or tobacco, such as cigarettes, e-cigarettes, and chewing tobacco. If you need help quitting, ask your doctor. General instructions  Take over-the-counter and prescription medicines only as told by your doctor.  If you were given a machine to use while you sleep, use it only as told by your doctor.  If you are having surgery, make sure to tell your doctor you have sleep apnea. You   may need to bring your device with you.  Keep all follow-up visits as told by your doctor. This is important. Contact a doctor if:  The machine that you were given to use during sleep bothers you or does not seem to be working.  You do not get better.  You get worse. Get help right away if:  Your chest hurts.  You have trouble breathing in enough air.  You have an uncomfortable feeling in your back, arms, or stomach.  You have trouble talking.  One side of your  body feels weak.  A part of your face is hanging down. These symptoms may be an emergency. Do not wait to see if the symptoms will go away. Get medical help right away. Call your local emergency services (911 in the [place] Do not drive yourself to the hospital. Summary  This condition affects breathing during sleep.  The most common cause is a collapsed or blocked airway.  The goal of treatment is to help you breathe normally while you sleep. This information is not intended to replace advice given to you by your health care provider. Make sure you discuss any questions you have with your health care provider. Document Revised: 10/17/2017 Document Reviewed: 08/26/2017 Elsevier Patient Education  [place]. OnabotulinumtoxinA injection (Medical Use) What is this medicine? ONABOTULINUMTOXINA (o na BOTT you lye num tox in eh) is a neuro-muscular blocker. This medicine is used to treat crossed eyes, eyelid spasms, severe neck muscle spasms, ankle and toe muscle spasms, and elbow, wrist, and finger muscle spasms. It is also used to treat excessive underarm sweating, to prevent chronic migraine headaches, and to treat loss of bladder control due to neurologic conditions such as multiple sclerosis or spinal cord injury. This medicine may be used for other purposes; ask your health care provider or pharmacist if you have questions. COMMON BRAND NAME(S): Botox What should I tell my health care provider before I take this medicine? They need to know if you have any of these conditions:  breathing problems  cerebral palsy spasms  difficulty urinating  heart problems  history of surgery where this medicine is going to be used  infection at the site where this medicine is going to be used  myasthenia gravis or other neurologic disease  nerve or muscle disease  surgery plans  take medicines that treat or prevent blood clots  thyroid problems  an unusual or allergic reaction to  botulinum toxin, albumin, other medicines, foods, dyes, or preservatives  pregnant or trying to get pregnant  breast-feeding How should I use this medicine? This medicine is for injection into a muscle. It is given by a health care professional in a hospital or clinic setting. Talk to your pediatrician regarding the use of this medicine in children. While this drug may be prescribed for children as young as 50 years old for selected conditions, precautions do apply. Overdosage: If you think you have taken too much of this medicine contact a poison control center or emergency room at once. NOTE: This medicine is only for you. Do not share this medicine with others. What if I miss a dose? This does not apply. What may interact with this medicine?  aminoglycoside antibiotics like gentamicin, neomycin, tobramycin  muscle relaxants  other botulinum toxin injections This list may not describe all possible interactions. Give your health care provider a list of all the medicines, herbs, non-prescription drugs, or dietary supplements you use. Also tell them if you smoke, drink  alcohol, or use illegal drugs. Some items may interact with your medicine. What should I watch for while using this medicine? Visit your doctor for regular check ups. This medicine will cause weakness in the muscle where it is injected. Tell your doctor if you feel unusually weak in other muscles. Get medical help right away if you have problems with breathing, swallowing, or talking. This medicine might make your eyelids droop or make you see blurry or double. If you have weak muscles or trouble seeing do not drive a car, use machinery, or do other dangerous activities. This medicine contains albumin from human blood. It may be possible to pass an infection in this medicine, but no cases have been reported. Talk to your doctor about the risks and benefits of this medicine. If your activities have been limited by your condition,  go back to your regular routine slowly after treatment with this medicine. What side effects may I notice from receiving this medicine? Side effects that you should report to your doctor or health care professional as soon as possible:  allergic reactions like skin rash, itching or hives, swelling of the face, lips, or tongue  breathing problems  changes in vision  chest pain or tightness  eye irritation, pain  fast, irregular heartbeat  infection  numbness  speech problems  swallowing problems  unusual weakness Side effects that usually do not require medical attention (report to your doctor or health care professional if they continue or are bothersome):  bruising or pain at site where injected  drooping eyelid  dry eyes or mouth  headache  muscles aches, pains  sensitivity to light  tearing This list may not describe all possible side effects. Call your doctor for medical advice about side effects. You may report side effects to FDA at 1-800-FDA-1088. Where should I keep my medicine? This drug is given in a hospital or clinic and will not be stored at home. NOTE: This sheet is a summary. It may not cover all possible information. If you have questions about this medicine, talk to your doctor, pharmacist, or health care provider.  2020 Elsevier/Gold Standard (2017-07-07 14:21:42)

## 2019-01-27 NOTE — Telephone Encounter (Signed)
Charge sheet signed and given to [facility].

## 2019-01-27 NOTE — Telephone Encounter (Signed)
Botox charge sheet completed, ready for MD signature. Dx: [id number].

## 2019-01-29 ENCOUNTER — Ambulatory Visit: Payer: Medicare PPO | Admitting: Gastroenterology

## 2019-02-01 ENCOUNTER — Ambulatory Visit (INDEPENDENT_AMBULATORY_CARE_PROVIDER_SITE_OTHER): Payer: Medicare Other | Admitting: Family Medicine

## 2019-02-01 ENCOUNTER — Telehealth: Payer: Self-pay

## 2019-02-01 VITALS — Ht 61.0 in | Wt 208.0 lb

## 2019-02-01 DIAGNOSIS — L309 Dermatitis, unspecified: Secondary | ICD-10-CM | POA: Diagnosis not present

## 2019-02-01 DIAGNOSIS — M25511 Pain in right shoulder: Secondary | ICD-10-CM

## 2019-02-01 DIAGNOSIS — R7982 Elevated C-reactive protein (CRP): Secondary | ICD-10-CM

## 2019-02-01 DIAGNOSIS — E1159 Type 2 diabetes mellitus with other circulatory complications: Secondary | ICD-10-CM

## 2019-02-01 DIAGNOSIS — E559 Vitamin D deficiency, unspecified: Secondary | ICD-10-CM

## 2019-02-01 DIAGNOSIS — F329 Major depressive disorder, single episode, unspecified: Secondary | ICD-10-CM

## 2019-02-01 DIAGNOSIS — F32A Depression, unspecified: Secondary | ICD-10-CM

## 2019-02-01 DIAGNOSIS — Z794 Long term (current) use of insulin: Secondary | ICD-10-CM

## 2019-02-01 NOTE — Telephone Encounter (Signed)
Error

## 2019-02-01 NOTE — Progress Notes (Signed)
Virtual Visit via Video Note  I connected with Autumn Valenzuela on 02/01/19 at  4:00 PM EST by a video enabled telemedicine application and verified that I am speaking with the correct person using two identifiers.  Location: Patient: In her home Provider: Lowell Persons participating in virtual visit: patient and provider   I discussed the limitations of evaluation and management by telemedicine and the availability of in person appointments. The patient expressed understanding and agreed to proceed.  History of Present Illness: Chief Complaint  Patient presents with  . Rash   This is a 59 yo female who presents today for virtual visit for dry skin and bumps on abdomen, back, under breasts and groin. She has been taking two hot showers a day, using Aveeno soap, Aloe vera lotion and is avoiding perfumes. Is very itchy, not red. External groin feels dry and itchy, no vaginal discharge or vaginal pain. She washes with very hot water to her perineum twice a day.   DM type 2- blood sugars all over the place. Taking metformin XR 500 mg bid (has not tolerated higher doses due to diarrhea). Taking lantus 10 mg qhs. Has not increased dose despite instructions in past. In the mornings, blood sugars low 100's, last night was 400, was 200 day before.  Last hemoglobin A1c was 8.7 which was down from 12.2.  Right shoulder pain- fell a couple of weeks ago and was seen at urgent care.  Is getting a little better but it is slow.  She is using tizanidine at night.  Depression and anxiety-she loves living with her daughter and grandchildren but it is sometimes hectic and she tries to help with childcare as much as possible.  She is doing church on Sundays and daily Bible reading and devotional's which helps.  She does try to get some alone time.   Observations/Objective: Patient is alert and answers questions appropriately.  She appears to have a very fine rash under her breasts and on her  abdomen.  It does not appear erythematous but it is difficult to tell on the video monitor.  Appears slightly flaky.  Respirations are even and unlabored without audible wheeze or witnessed cough.  Her mood and affect are typical for her.  She was mildly tearful during part of the interview. Ht 5\' 1"  (1.549 m)   Wt 208 lb (94.3 kg)   BMI 39.30 kg/m  Wt Readings from Last 3 Encounters:  02/01/19 208 lb (94.3 kg)  01/27/19 208 lb (94.3 kg)  11/30/18 216 lb (98 kg)    Assessment and Plan: 1. Dermatitis -I suspect she is over drying her skin with excessive bathing.  We discussed avoiding hot water and decreasing frequency of showers.  She was advised to apply thick emollient cream immediately after bathing to affected areas -She was instructed to follow-up if no improvement in 2 weeks or if worsening  2. Type 2 diabetes mellitus with other circulatory complication, with long-term current use of insulin ([place]) -Discussed importance of increasing Lantus dose for blood sugars over 200.  It seems as though her evening blood sugars are much higher, may need to divide doses.  We will start with taking her evening dose with dinner instead of at bedtime and she was instructed to check her blood sugars several times a day and keep a log.  She is due blood work and agrees to come in within the week for a lab only visit. - Hemoglobin A1c; Future - TSH;  Future  3. Acute pain of right shoulder -Slowly getting better.  Discussed doing gentle range of motion and weighted pendulum exercises.  4. Depression, unspecified depression type -Difficult times with pandemic.  Encourage patient to continue socialization as she is able, find things that rejuvenate her including going to church, daily devotional, spending time with her grandchildren. - TSH; Future  5. Vitamin D deficiency - Vitamin D, 25-hydroxy; Future  6. Elevated C-reactive protein (CRP) - High sensitivity CRP; Future  -Follow-up to be  determined by lab results, either 3 or 6 months.  Clarene Reamer, FNP-BC  [facility]  02/02/2019 4:55 PM   Follow Up Instructions:    I discussed the assessment and treatment plan with the patient. The patient was provided an opportunity to ask questions and all were answered. The patient agreed with the plan and demonstrated an understanding of the instructions.   The patient was advised to call back or seek an in-person evaluation if the symptoms worsen or if the condition fails to improve as anticipated.    Elby Beck, FNP

## 2019-02-02 ENCOUNTER — Encounter: Payer: Self-pay | Admitting: Family Medicine

## 2019-02-03 NOTE — Telephone Encounter (Signed)
I called and scheduled the patient. Spoke with her POA (listed on DPR). DWD

## 2019-02-04 ENCOUNTER — Telehealth: Payer: Self-pay | Admitting: Neurology

## 2019-02-04 ENCOUNTER — Other Ambulatory Visit: Payer: Medicare Other

## 2019-02-04 ENCOUNTER — Other Ambulatory Visit: Payer: Self-pay

## 2019-02-04 ENCOUNTER — Encounter: Payer: Self-pay | Admitting: Neurology

## 2019-02-04 ENCOUNTER — Other Ambulatory Visit (INDEPENDENT_AMBULATORY_CARE_PROVIDER_SITE_OTHER): Payer: Medicare Other

## 2019-02-04 ENCOUNTER — Ambulatory Visit (INDEPENDENT_AMBULATORY_CARE_PROVIDER_SITE_OTHER): Payer: Medicare Other | Admitting: Neurology

## 2019-02-04 ENCOUNTER — Telehealth: Payer: Self-pay | Admitting: Family Medicine

## 2019-02-04 VITALS — BP 119/85 | HR 89 | Temp 97.8°F | Ht 61.0 in | Wt 210.0 lb

## 2019-02-04 DIAGNOSIS — I739 Peripheral vascular disease, unspecified: Secondary | ICD-10-CM

## 2019-02-04 DIAGNOSIS — Z794 Long term (current) use of insulin: Secondary | ICD-10-CM | POA: Diagnosis not present

## 2019-02-04 DIAGNOSIS — E1149 Type 2 diabetes mellitus with other diabetic neurological complication: Secondary | ICD-10-CM

## 2019-02-04 DIAGNOSIS — R7982 Elevated C-reactive protein (CRP): Secondary | ICD-10-CM | POA: Diagnosis not present

## 2019-02-04 DIAGNOSIS — G43711 Chronic migraine without aura, intractable, with status migrainosus: Secondary | ICD-10-CM

## 2019-02-04 DIAGNOSIS — G4719 Other hypersomnia: Secondary | ICD-10-CM

## 2019-02-04 DIAGNOSIS — Z72820 Sleep deprivation: Secondary | ICD-10-CM | POA: Insufficient documentation

## 2019-02-04 DIAGNOSIS — R519 Headache, unspecified: Secondary | ICD-10-CM

## 2019-02-04 DIAGNOSIS — R232 Flushing: Secondary | ICD-10-CM | POA: Insufficient documentation

## 2019-02-04 DIAGNOSIS — E1140 Type 2 diabetes mellitus with diabetic neuropathy, unspecified: Secondary | ICD-10-CM

## 2019-02-04 DIAGNOSIS — F329 Major depressive disorder, single episode, unspecified: Secondary | ICD-10-CM

## 2019-02-04 DIAGNOSIS — F32A Depression, unspecified: Secondary | ICD-10-CM

## 2019-02-04 DIAGNOSIS — E559 Vitamin D deficiency, unspecified: Secondary | ICD-10-CM

## 2019-02-04 DIAGNOSIS — E1159 Type 2 diabetes mellitus with other circulatory complications: Secondary | ICD-10-CM

## 2019-02-04 DIAGNOSIS — F3162 Bipolar disorder, current episode mixed, moderate: Secondary | ICD-10-CM

## 2019-02-04 DIAGNOSIS — M542 Cervicalgia: Secondary | ICD-10-CM | POA: Insufficient documentation

## 2019-02-04 DIAGNOSIS — G2581 Restless legs syndrome: Secondary | ICD-10-CM

## 2019-02-04 NOTE — Telephone Encounter (Signed)
This encounter was created in error - please disregard.

## 2019-02-04 NOTE — Progress Notes (Signed)
SLEEP MEDICINE CLINIC    Provider:  Larey Seat, MD  Primary Care Physician:  Elby Beck, [name]     Referring Provider: Dr.Sethi/ Dr.Ahern        Chief Complaint according to patient   Patient presents with:    . New Patient (Initial Visit)           HISTORY OF PRESENT ILLNESS:  Autumn Valenzuela is a 59 y.o. year old AA female patient , and is seen here upon referral on 02/04/2019   Chief concern according to patient :  Sleep concerns.    I have the pleasure of seeing Autumn Valenzuela today, a right -handed Black or [place] American female with a possible sleep disorder.  Autumn Valenzuela was referred by Dr. Leonie Man to Dr. Jaynee Eagles she had recently another MRI scan of the brain he had increased her Zanaflex to 3 times daily increased her gabapentin dose to 300 mg twice daily and discontinued Flexeril in response to the patient's concern of neck pain and headaches.  She was started on Elavil in the year 2015.  She had been referred for polysomnogram at the time again but this was never concluded.  She has a polysomnogram upon Dr. Clydene Fake orders on 11-27 2007 medications were listed, EKG showed bradycardia tendency, the EEG showed no evidence of seizure disorder there were lots of periodic limb movements she had a REM latency of 229 minutes with this excessively long.  Sleep efficiency was only 75% of the recorded total time.  Very fragmented sleep pattern at the time AHI was only 2.1/h no diagnosis of obstructive sleep apnea was made.    She  has a past medical history of Allergy, Anemia, Anxiety, Arthritis, Back pain, Bipolar disorder ([place]), CVA (cerebral infarction), Depression, Diabetes mellitus without complication ([place]), High cholesterol, Hypertension, Left leg pain, Migraine, Sleep apnea, and Stroke ([place]).       Family medical /sleep history: no other family member on CPAP with OSA.   Social history: Patient is disabled and  lives in a household with 4 persons/ alone. Family status is single,  living with her daughter and her 2 children. Pets are present. Dog and turtle.  Tobacco use- 2017.  ETOH use ; none Caffeine intake in form of Coffee( non) Soda(coke / 2 a day) Tea ( rare) or energy drinks. Regular exercise in form of lifting, stretching..    Sleep habits are as follows: The patient's dinner time is between 7.30 PM. The patient goes to bed at 3 AM watching TV - and continues to sleep for a couple of hours, wakes for 3-10 bathroom breaks. The preferred sleep position is laterally, with the support of 2 pillows. She has arm pain and changes positions often. Bedroom with TV, but cool, quiet.  Dreams are reportedly rare.  4.30 AM is the usual rise time. The patient wakes up with an alarm.  She reports not feeling refreshed or restored in AM, with symptoms such as dry mouth. Naps are taken frequently, but she takes power-naps frequently 20-30 minutes  .    Review of Systems: Out of a complete 14 system review, the patient complains of only the following symptoms, and all other reviewed systems are negative.:  Fatigue, sleepiness , loud snoring, fragmented sleep, Insomnia - nocturia, total sleep time is restricted to only 4-5 hours , sometimes less.    How likely are you to doze in the following  situations: 0 = not likely, 1 = slight chance, 2 = moderate chance, 3 = high chance   Sitting and Reading? Watching Television? Sitting inactive in a public place (theater or meeting)? As a passenger in a car for an hour without a break? Lying down in the afternoon when circumstances permit? Sitting and talking to someone? Sitting quietly after lunch without alcohol? In a car, while stopped for a few minutes in traffic?   Total = 11 24 points   FSS endorsed at  55/ 63 points.   Social History   Socioeconomic History  . Marital status: Single    Spouse name: Not on file  . Number of children: 3  . Years of  education: 56  . Highest education level: Not on file  Occupational History  . Not on file  Tobacco Use  . Smoking status: Former Smoker    Quit date: 12/13/2015    Years since quitting: 3.1  . Smokeless tobacco: Never Used  Substance and Sexual Activity  . Alcohol use: No  . Drug use: Yes    Types: Marijuana    Comment: last weed use 26 Dec 2018  . Sexual activity: Yes    Birth control/protection: None  Other Topics Concern  . Not on file  Social History Narrative   Patient is single with 3 children.   Patient is right handed.   Patient has a high school education.   Patient drinks at least 3 cups of caffeine daily.   Social Determinants of Health   Financial Resource Strain:   . Difficulty of Paying Living Expenses: Not on file  Food Insecurity:   . Worried About Charity fundraiser in the Last Year: Not on file  . Ran Out of Food in the Last Year: Not on file  Transportation Needs:   . Lack of Transportation (Medical): Not on file  . Lack of Transportation (Non-Medical): Not on file  Physical Activity:   . Days of Exercise per Week: Not on file  . Minutes of Exercise per Session: Not on file  Stress:   . Feeling of Stress : Not on file  Social Connections:   . Frequency of Communication with Friends and Family: Not on file  . Frequency of Social Gatherings with Friends and Family: Not on file  . Attends Religious Services: Not on file  . Active Member of Clubs or Organizations: Not on file  . Attends Archivist Meetings: Not on file  . Marital Status: Not on file    Family History  Problem Relation Age of Onset  . Diabetes Mother   . Stroke Father   . Cancer Paternal Aunt   . Cancer Paternal Aunt   . Diabetes Brother   . Colon cancer Neg Hx   . Esophageal cancer Neg Hx   . Rectal cancer Neg Hx   . Stomach cancer Neg Hx     Past Medical History:  Diagnosis Date  . Allergy   . Anemia   . Anxiety   . Arthritis   . Back pain   . Bipolar  disorder ([place])   . CVA (cerebral infarction)   . Depression   . Diabetes mellitus without complication ([place])   . High cholesterol   . Hypertension   . Left leg pain   . Migraine   . Sleep apnea   . Stroke [facility])     Past Surgical History:  Procedure Laterality Date  . ABDOMINAL HYSTERECTOMY    .  ANTERIOR LATERAL LUMBAR FUSION WITH PERCUTANEOUS SCREW 1 LEVEL Left 02/11/2018   Procedure: LEFT LATERAL LUMBAR  FOUR-FIVE INTERBODY FUSION WITH INSTRUMENTATION AND ALLOGRAFT;  Surgeon: Phylliss Bob, MD;  Location: [facility];  Service: Orthopedics;  Laterality: Left;  LEFT LATERAL LUMBAR  FOUR-FIVE INTERBODY FUSION WITH INSTRUMENTATION AND ALLOGRAFT  . BACK SURGERY     Lumbar fusion L5-S1  . DIABETES    . EYE SURGERY Bilateral   . KNEE SURGERY    . l 4-l5 status post lateral posterior fusion January 30,2020    . NEUROFORAMINAL STENOSIS Left    Severe L4-L5, involving the level above previous fusion.  Marland Kitchen PARTIAL HYSTERECTOMY    . RADICULOPATHY Left    L4, Secondary to an L4-L5 Spondylolisthesis  . SHOULDER SURGERY       Current Outpatient Medications on File Prior to Visit  Medication Sig Dispense Refill  . Accu-Chek FastClix Lancets MISC TEST 4 TIMES DAILY. DX E11.9 306 each 3  . albuterol (PROVENTIL HFA;VENTOLIN HFA) 108 (90 Base) MCG/ACT inhaler Inhale 2 puffs into the lungs daily as needed for wheezing or shortness of breath. 18 g 2  . albuterol (PROVENTIL) (2.5 MG/3ML) 0.083% nebulizer solution Take 2.5 mg by nebulization at bedtime as needed for wheezing.    Marland Kitchen ALPRAZolam (XANAX) 0.5 MG tablet Take 1 tablet (0.5 mg total) by mouth daily as needed for anxiety. 30 tablet 2  . amitriptyline (ELAVIL) 25 MG tablet TAKE 1 TABLET(25 MG) BY MOUTH AT BEDTIME 90 tablet 1  . atorvastatin (LIPITOR) 20 MG tablet Take 1 tablet (20 mg total) by mouth daily. 90 tablet 3  . Blood Glucose Calibration (ACCU-CHEK GUIDE CONTROL) LIQD 1 each by In Vitro route every 30 (thirty) days. Needs Accu-check guide  monitor system. DX. E11.9 3 each 3  . calcium-vitamin D (OSCAL WITH D) 500-200 MG-UNIT per tablet Take 1 tablet by mouth daily with breakfast.    . DULoxetine (CYMBALTA) 60 MG capsule Take 1 capsule (60 mg total) by mouth at bedtime. 90 capsule 1  . fluconazole (DIFLUCAN) 150 MG tablet     . fluticasone (FLONASE) 50 MCG/ACT nasal spray Place 2 sprays into both nostrils daily. (Patient taking differently: Place 2 sprays into both nostrils daily as needed for allergies. ) 16 g 0  . gabapentin (NEURONTIN) 300 MG capsule 1 capsule twice dailty 60 capsule 1  . glucose blood (ACCU-CHEK GUIDE) test strip Use to test blood sugar 4 times a day.  Dx: E11.9 400 each 3  . Insulin Glargine (LANTUS) 100 UNIT/ML Solostar Pen Inject 10 Units into the skin daily. If blood sugar over 200, increase by 2 units up to 40 units. 15 mL 1  . Insulin Pen Needle 31G X 5 MM MISC 1 Units by Does not apply route at bedtime. 90 each 3  . ketorolac (ACULAR) 0.4 % SOLN Place 1 drop into both eyes daily as needed (eye irritation).     Marland Kitchen levocetirizine (XYZAL) 5 MG tablet Take 1 tablet (5 mg total) by mouth every evening. 30 tablet 2  . loratadine (CLARITIN) 10 MG tablet Take 1 tablet (10 mg total) by mouth daily. (Patient taking differently: Take 10 mg by mouth daily as needed for allergies. ) 30 tablet 0  . meloxicam (MOBIC) 15 MG tablet One tablet every 24 hours as needed for inflammation. 90 tablet 0  . metFORMIN (GLUCOPHAGE-XR) 500 MG 24 hr tablet Take 1 tablet (500 mg total) by mouth 2 (two) times daily with a meal. 180  tablet 1  . ondansetron (ZOFRAN-ODT) 8 MG disintegrating tablet Take 1 tablet (8 mg total) by mouth every 8 (eight) hours as needed for nausea. 20 tablet 0  . predniSONE (STERAPRED UNI-PAK 21 TAB) 10 MG (21) TBPK tablet 6 tabs for 1 day, then 5 tabs for 1 das, then 4 tabs for 1 day, then 3 tabs for 1 day, 2 tabs for 1 day, then 1 tab for 1 day 21 tablet 0  . tiZANidine (ZANAFLEX) 4 MG tablet Take 1 tablet (4 mg  total) by mouth 3 (three) times daily. Start 4 mg at night x 1 week then twice daily x 1 week and then three times daily 90 tablet 2  . topiramate (TOPAMAX) 100 MG tablet Take 1 tablet (100 mg total) by mouth 2 (two) times daily. 60 tablet 2  . triamcinolone ointment (KENALOG) 0.1 % Apply topically 2 (two) times daily. For no more than 10 days. 30 g 1  . Vitamin D, Ergocalciferol, (DRISDOL) 50000 units CAPS capsule Take 1 capsule (50,000 Units total) by mouth every 7 (seven) days. 12 capsule 3   No current facility-administered medications on file prior to visit.    Allergies  Allergen Reactions  . Cefazolin Swelling and Other (See Comments)    Rapid iv infusion caused swelling and burning at the iv site    Physical exam:  Today's Vitals   02/04/19 1520  BP: 119/85  Pulse: 89  Temp: 97.8 F (36.6 C)  Weight: 210 lb (95.3 kg)  Height: 5\' 1"  (1.549 m)   Body mass index is 39.68 kg/m.   Wt Readings from Last 3 Encounters:  02/04/19 210 lb (95.3 kg)  02/01/19 208 lb (94.3 kg)  01/27/19 208 lb (94.3 kg)     Ht Readings from Last 3 Encounters:  02/04/19 5\' 1"  (1.549 m)  02/01/19 5\' 1"  (1.549 m)  01/27/19 5\' 1"  (1.549 m)      General: The patient is awake, alert and appears not in acute distress. The patient is well groomed. Head: Normocephalic, atraumatic. Neck is supple. Mallampati 2 ,  neck circumference:16. 25  inches . Nasal airflow patent.   Retrognathia is seen. Dental status: N/A Cardiovascular:  Regular rate and cardiac rhythm by pulse,  without distended neck veins. Respiratory: Lungs are clear to auscultation.  Skin:  Without evidence of ankle edema, or rash. Trunk: The patient's posture is erect.   Neurologic exam : The patient is awake and alert, oriented to place and time.   Memory subjective described as intact.  Attention span & concentration ability appears normal.  Speech is fluent,  without  dysarthria, dysphonia or aphasia.  Mood and affect are  appropriate.   Cranial nerves: no loss of smell or taste reported  Pupils are equal and briskly reactive to light. Funduscopic exam deferred.   Extraocular movements in vertical and horizontal planes were intact and without nystagmus. No Diplopia. Visual fields by finger perimetry are intact. Hearing was intact to soft voice and finger rubbing.   Facial sensation intact to fine touch. Facial motor strength is symmetric and tongue and uvula move midline.  Neck ROM : rotation, tilt and flexion extension were normal for age and shoulder shrug was symmetrical.    Motor exam:  Symmetric bulk, tone and ROM.   Normal tone without cog wheeling, symmetric grip strength . She reports difficulties with opening jars.  Sensory:  Fine touch, pinprick and vibration were tested  and  normal.  Proprioception tested in the  upper extremities was normal. Coordination: Rapid alternating movements in the fingers/hands were of normal speed.  The Finger-to-nose maneuver was intact without evidence of ataxia, dysmetria or tremor. Gait and station: Patient could rise unassisted from a seated position, walked without assistive device.  Stance is of normal width/ base and the patient turned with 4 steps.  Toe and heel walk were deferred.  Deep tendon reflexes: in the  upper and lower extremities are symmetric and intact.  Babinski response was deferred.      After spending a total time of 35 minutes face to face and additional time for physical and neurologic examination, review of laboratory studies,  personal review of imaging studies, reports and results of other testing and review of referral information / records as far as provided in visit, I have established the following assessments:  1)  Insomnia with poor sleep hygiene, delayed bedtime, delayed sleep onset, Tv.  Racing thoughts, inner restlessness.  2) The patient has PLMs. Reports the irresistible urge to move / RLS.  3)  Patient reports snoring and  choking sensations. Needs OSA check  4)  She has had several strokes , she reports.    My Plan is to proceed with:  1) hopefully,a sleep evaluation can help to rule out sleep as a trigger for headaches, neck pain.  2) RLS - she is anemic, by report, will check Ferretin,TIBC.  3) PSG ordered.   I would like to thank Dr Jaynee Eagles for allowing me to meet with and to take care of this pleasant patient.   In short, I plan to follow up  through our NP within 2-3 month.   CC: I will share my notes with Dr Jaynee Eagles and Dr Leonie Man  Electronically signed by: Larey Seat, MD 02/04/2019 3:21 PM  [facility] Board certified by The [employer] of Sleep Medicine and Diplomate of the [employer] of Sleep Medicine. Board certified In Neurology through the [place], Fellow of the [employer] of Neurology. Medical Director of [facility].

## 2019-02-04 NOTE — Telephone Encounter (Signed)
Myriam Jacobson with [facility] called in regards to the patient  Patient was in today to have labs drawn.  They are also needing labs and wanted to know if the labs they are needing could also be added on to those drawn today.  Advise I would have to get in touch with you and see if this is something you would do  They were trying to avoid having the patient get stuck twice.  Myriam Jacobson said the lab request are in epic.    Please advise   [facility] C/b # [phone]

## 2019-02-04 NOTE — Telephone Encounter (Signed)
Called the patient office at [place] to ask if their lab would be willing to add on Dr Dohmeier's lab order to the lab work that patient completed around 2:45 pm at their office. I spoke with someone at the fron and gave our office number and they will contact us to let us know if they can or can't.   ** if they call back please document if they are able to add the lab and if I need to fax the lab req I can, I would just need the fax number to send it.   If they can't let me know and the patient will have to have it completed at a later time.

## 2019-02-04 NOTE — Patient Instructions (Signed)
Insomnia Insomnia is a sleep disorder that makes it difficult to fall asleep or stay asleep. Insomnia can cause fatigue, low energy, difficulty concentrating, mood swings, and poor performance at work or school. There are three different ways to classify insomnia:  Difficulty falling asleep.  Difficulty staying asleep.  Waking up too early in the morning. Any type of insomnia can be long-term (chronic) or short-term (acute). Both are common. Short-term insomnia usually lasts for three months or less. Chronic insomnia occurs at least three times a week for longer than three months. What are the causes? Insomnia may be caused by another condition, situation, or substance, such as:  Anxiety.  Certain medicines.  Gastroesophageal reflux disease (GERD) or other gastrointestinal conditions.  Asthma or other breathing conditions.  Restless legs syndrome, sleep apnea, or other sleep disorders.  Chronic pain.  Menopause.  Stroke.  Abuse of alcohol, tobacco, or illegal drugs.  Mental health conditions, such as depression.  Caffeine.  Neurological disorders, such as Alzheimer's disease.  An overactive thyroid (hyperthyroidism). Sometimes, the cause of insomnia may not be known. What increases the risk? Risk factors for insomnia include:  Gender. Women are affected more often than men.  Age. Insomnia is more common as you get older.  Stress.  Lack of exercise.  Irregular work schedule or working night shifts.  Traveling between different time zones.  Certain medical and mental health conditions. What are the signs or symptoms? If you have insomnia, the main symptom is having trouble falling asleep or having trouble staying asleep. This may lead to other symptoms, such as:  Feeling fatigued or having low energy.  Feeling nervous about going to sleep.  Not feeling rested in the morning.  Having trouble concentrating.  Feeling irritable, anxious, or depressed. How  is this diagnosed? This condition may be diagnosed based on:  Your symptoms and medical history. Your health care provider may ask about: ? Your sleep habits. ? Any medical conditions you have. ? Your mental health.  A physical exam. How is this treated? Treatment for insomnia depends on the cause. Treatment may focus on treating an underlying condition that is causing insomnia. Treatment may also include:  Medicines to help you sleep.  Counseling or therapy.  Lifestyle adjustments to help you sleep better. Follow these instructions at home: Eating and drinking   Limit or avoid alcohol, caffeinated beverages, and cigarettes, especially close to bedtime. These can disrupt your sleep.  Do not eat a large meal or eat spicy foods right before bedtime. This can lead to digestive discomfort that can make it hard for you to sleep. Sleep habits   Keep a sleep diary to help you and your health care provider figure out what could be causing your insomnia. Write down: ? When you sleep. ? When you wake up during the night. ? How well you sleep. ? How rested you feel the next day. ? Any side effects of medicines you are taking. ? What you eat and drink.  Make your bedroom a dark, comfortable place where it is easy to fall asleep. ? Put up shades or blackout curtains to block light from outside. ? Use a white noise machine to block noise. ? Keep the temperature cool.  Limit screen use before bedtime. This includes: ? Watching TV. ? Using your smartphone, tablet, or computer.  Stick to a routine that includes going to bed and waking up at the same times every day and night. This can help you fall asleep faster. Consider   making a quiet activity, such as reading, part of your nighttime routine.  Try to avoid taking naps during the day so that you sleep better at night.  Get out of bed if you are still awake after 15 minutes of trying to sleep. Keep the lights down, but try reading or  doing a quiet activity. When you feel sleepy, go back to bed. General instructions  Take over-the-counter and prescription medicines only as told by your health care provider.  Exercise regularly, as told by your health care provider. Avoid exercise starting several hours before bedtime.  Use relaxation techniques to manage stress. Ask your health care provider to suggest some techniques that may work well for you. These may include: ? Breathing exercises. ? Routines to release muscle tension. ? Visualizing peaceful scenes.  Make sure that you drive carefully. Avoid driving if you feel very sleepy.  Keep all follow-up visits as told by your health care provider. This is important. Contact a health care provider if:  You are tired throughout the day.  You have trouble in your daily routine due to sleepiness.  You continue to have sleep problems, or your sleep problems get worse. Get help right away if:  You have serious thoughts about hurting yourself or someone else. If you ever feel like you may hurt yourself or others, or have thoughts about taking your own life, get help right away. You can go to your nearest emergency department or call:  Your local emergency services (911 in the [place]  A suicide crisis helpline, such as the [facility] at (630)818-1110. This is open 24 hours a day. Summary  Insomnia is a sleep disorder that makes it difficult to fall asleep or stay asleep.  Insomnia can be long-term (chronic) or short-term (acute).  Treatment for insomnia depends on the cause. Treatment may focus on treating an underlying condition that is causing insomnia.  Keep a sleep diary to help you and your health care provider figure out what could be causing your insomnia. This information is not intended to replace advice given to you by your health care provider. Make sure you discuss any questions you have with your health care provider. Document  Revised: 12/13/2016 Document Reviewed: 10/10/2016 Elsevier Patient Education  2020 [place]. Restless Legs Syndrome Restless legs syndrome is a condition that causes uncomfortable feelings or sensations in the legs, especially while sitting or lying down. The sensations usually cause an overwhelming urge to move the legs. The arms can also sometimes be affected. The condition can range from mild to severe. The symptoms often interfere with a person's ability to sleep. What are the causes? The cause of this condition is not known. What increases the risk? The following factors may make you more likely to develop this condition:  Being older than 50.  Pregnancy.  Being a woman. In general, the condition is more common in women than in men.  A family history of the condition.  Having iron deficiency.  Overuse of caffeine, nicotine, or alcohol.  Certain medical conditions, such as kidney disease, Parkinson's disease, or nerve damage.  Certain medicines, such as those for high blood pressure, nausea, colds, allergies, depression, and some heart conditions. What are the signs or symptoms? The main symptom of this condition is uncomfortable sensations in the legs, such as:  Pulling.  Tingling.  Prickling.  Throbbing.  Crawling.  Burning. Usually, the sensations:  Affect both sides of the body.  Are worse when you sit  or lie down.  Are worse at night. These may wake you up or make it difficult to fall asleep.  Make you have a strong urge to move your legs.  Are temporarily relieved by moving your legs. The arms can also be affected, but this is rare. People who have this condition often have tiredness during the day because of their lack of sleep at night. How is this diagnosed? This condition may be diagnosed based on:  Your symptoms.  Blood tests. In some cases, you may be monitored in a sleep lab by a specialist (a sleep study). This can detect any disruptions  in your sleep. How is this treated? This condition is treated by managing the symptoms. This may include:  Lifestyle changes, such as exercising, using relaxation techniques, and avoiding caffeine, alcohol, or tobacco.  Medicines. Anti-seizure medicines may be tried first. Follow these instructions at home:     General instructions  Take over-the-counter and prescription medicines only as told by your health care provider.  Use methods to help relieve the uncomfortable sensations, such as: ? Massaging your legs. ? Walking or stretching. ? Taking a cold or hot bath.  Keep all follow-up visits as told by your health care provider. This is important. Lifestyle  Practice good sleep habits. For example, go to bed and get up at the same time every day. Most adults should get 7-9 hours of sleep each night.  Exercise regularly. Try to get at least 30 minutes of exercise most days of the week.  Practice ways of relaxing, such as yoga or meditation.  Avoid caffeine and alcohol.  Do not use any products that contain nicotine or tobacco, such as cigarettes and e-cigarettes. If you need help quitting, ask your health care provider. Contact a health care provider if:  Your symptoms get worse or they do not improve with treatment. Summary  Restless legs syndrome is a condition that causes uncomfortable feelings or sensations in the legs, especially while sitting or lying down.  The symptoms often interfere with a person's ability to sleep.  This condition is treated by managing the symptoms. You may need to make lifestyle changes or take medicines. This information is not intended to replace advice given to you by your health care provider. Make sure you discuss any questions you have with your health care provider. Document Revised: 01/20/2017 Document Reviewed: 01/20/2017 Elsevier Patient Education  [place].

## 2019-02-05 ENCOUNTER — Other Ambulatory Visit (INDEPENDENT_AMBULATORY_CARE_PROVIDER_SITE_OTHER): Payer: Medicare Other

## 2019-02-05 DIAGNOSIS — G2581 Restless legs syndrome: Secondary | ICD-10-CM | POA: Diagnosis not present

## 2019-02-05 DIAGNOSIS — D649 Anemia, unspecified: Secondary | ICD-10-CM

## 2019-02-05 DIAGNOSIS — R790 Abnormal level of blood mineral: Secondary | ICD-10-CM

## 2019-02-05 LAB — HIGH SENSITIVITY CRP: CRP, High Sensitivity: 12.92 mg/L — ABNORMAL HIGH (ref 0.000–5.000)

## 2019-02-05 LAB — IBC + FERRITIN
Ferritin: 57.5 ng/mL (ref 10.0–291.0)
Iron: 25 ug/dL — ABNORMAL LOW (ref 42–145)
Saturation Ratios: 9.4 % — ABNORMAL LOW (ref 20.0–50.0)
Transferrin: 189 mg/dL — ABNORMAL LOW (ref 212.0–360.0)

## 2019-02-05 LAB — TSH: TSH: 0.51 u[IU]/mL (ref 0.35–4.50)

## 2019-02-05 LAB — HEMOGLOBIN A1C: Hgb A1c MFr Bld: 14.6 % — ABNORMAL HIGH (ref 4.6–6.5)

## 2019-02-05 LAB — VITAMIN D 25 HYDROXY (VIT D DEFICIENCY, FRACTURES): VITD: 11.99 ng/mL — ABNORMAL LOW (ref 30.00–100.00)

## 2019-02-05 NOTE — Telephone Encounter (Signed)
Please add on iron, TIBC, ferritin.

## 2019-02-08 MED ORDER — FERROUS SULFATE 325 (65 FE) MG PO TABS: 325.0000 mg | ORAL_TABLET | ORAL | 3 refills | Status: AC

## 2019-02-08 MED ORDER — VITAMIN D3 1.25 MG (50000 UT) PO TABS: 1.0000 | ORAL_TABLET | ORAL | 3 refills | Status: DC

## 2019-02-08 NOTE — Addendum Note (Signed)
Addended by: Clarene Reamer B on: 02/08/2019 07:59 AM   Modules accepted: Orders

## 2019-02-09 NOTE — Progress Notes (Signed)
Really low iron saturation-  I thank NP Clarene Reamer for catching this.  Should we try oral supplementation or go for IV Ferrlicit ( or similar) in my office ?

## 2019-02-10 ENCOUNTER — Telehealth: Payer: Self-pay | Admitting: Neurology

## 2019-02-10 DIAGNOSIS — Z72820 Sleep deprivation: Secondary | ICD-10-CM

## 2019-02-10 DIAGNOSIS — G4719 Other hypersomnia: Secondary | ICD-10-CM

## 2019-02-10 DIAGNOSIS — G2581 Restless legs syndrome: Secondary | ICD-10-CM

## 2019-02-10 DIAGNOSIS — E611 Iron deficiency: Secondary | ICD-10-CM

## 2019-02-10 NOTE — Telephone Encounter (Signed)
-----   Message from Larey Seat, MD sent at 02/09/2019 12:50 PM EST ----- Really low iron saturation-  I thank NP Clarene Reamer for catching this.  Should we try oral supplementation or go for IV Ferrlicit ( or similar) in my office ?

## 2019-02-10 NOTE — Telephone Encounter (Signed)
Called the patient and her daughter answered.per DPR I was able to review lab result information with her. Informed her that her mom's iron level was low. Dr Dohmeier would recommend the patient start over the counter iron supplement Slow FE 325 mg Iron. Encouraged that the patient take this daily and we will recheck the labs in about 6 wks after she starts the medication. Informed the daughter can have the patient come to our office and have the lab drawn her. Informed our lab is open Monday-thur 8-5 with lunch from 12:15-1:15pm. Pt daughter verbalized understanding.

## 2019-02-10 NOTE — Progress Notes (Signed)
Lets see if she tolerates oral iron well enough, recheck levels after 6 weeks- change to IV iif stomach problems occur.

## 2019-02-17 ENCOUNTER — Telehealth: Payer: Self-pay

## 2019-02-17 MED ORDER — BOTOX 100 UNITS IJ SOLR: INTRAMUSCULAR | 1 refills | Status: DC

## 2019-02-17 NOTE — Telephone Encounter (Signed)
Noted, thank you. DWD

## 2019-02-17 NOTE — Telephone Encounter (Signed)
Autumn Valenzuela would you send script for Botox 100 unit qty 2 to [facility]?

## 2019-02-19 ENCOUNTER — Other Ambulatory Visit: Payer: Self-pay | Admitting: Family Medicine

## 2019-02-19 ENCOUNTER — Telehealth: Payer: Self-pay | Admitting: Family Medicine

## 2019-02-19 DIAGNOSIS — M79601 Pain in right arm: Secondary | ICD-10-CM

## 2019-02-19 DIAGNOSIS — J069 Acute upper respiratory infection, unspecified: Secondary | ICD-10-CM

## 2019-02-19 MED ORDER — ALBUTEROL SULFATE HFA 108 (90 BASE) MCG/ACT IN AERS: 2.0000 | INHALATION_SPRAY | Freq: Every day | RESPIRATORY_TRACT | 2 refills | Status: DC | PRN

## 2019-02-19 MED ORDER — TRAMADOL HCL 50 MG PO TABS: 50.0000 mg | ORAL_TABLET | Freq: Three times a day (TID) | ORAL | 0 refills | Status: AC | PRN

## 2019-02-19 NOTE — Telephone Encounter (Signed)
Pt states that she has mentioned her arm pain to Neurologist and they are more concerned with her getting a sleep study done first before approaching her arm issues. Pt states that she cannot sleep with this pain and does not feel the sleep apnea test is not going to be accurate. Pt states that she is only taking what the Neurologist gave her and nothing else -- Trazodone, takes at night. Pt takes Extra Strength Tylenol sometimes. Pt is trying different sleep positions, even sleeping in a recliner.    BS are running in 200s still highest in the 300s Daughter is working with her to make sure that her BS levels stay down. Adjusting diet. Changing the way she cooks. Eating healthier, limiting caffeine and sodas to diet drinks and lessening the amounts. Staying hydrated.   Will send to [facility] as FYI.

## 2019-02-19 NOTE — Telephone Encounter (Signed)
Please call patient and find out how much insulin is she taking? Her elevated blood sugars could be contributing to her arm pain. I can send in some tramadol for pain is she would like?

## 2019-02-19 NOTE — Telephone Encounter (Signed)
Patient called and stated that she is needing a new prescription sent to her pharmacy  Albuterol inhaler   [place]    Also,  Patient stated that since she had her visit with the neurologist her right arm has been giving her a lot of trouble. She stated that it is to the point she can not even lay down because there is so much pain,and she is in tears because it hurts so much.  Patient stated that she can not tolerate this anymore and really needs to know what she should do from here to help .,

## 2019-02-19 NOTE — Telephone Encounter (Signed)
Please call patient see how her blood sugars have done over the weekend. Continue to increase insulin when blood sugar over 200. I saw where she is drinking a large amount of water through the night. How much is she drinking during the day? Needs to avoid things that interrupt her sleep (such as getting up to urinate). Can she drink more during the day? Hopefully, with improved blood sugar control, she will be less thirsty.

## 2019-02-19 NOTE — Telephone Encounter (Signed)
Pt and daughter both states that they are following insulin instructions as given.  Pt is taking an additional 2 units daily if BS is over 200 -- for every day it is over 200 they increase an additional 2 units. Started at 14 units and is now at 18 units. BS was 227 this morning and states that if BS is still above 200 tomorrow she will increase to 20 units.   Pt states that she will drink 3-4 bottles of water between 12am and 4am and is urinating a lot. Pt feels excessive thirst and feels dehydrated and her mouth feels very dry. Pt states that she has trouble sleeping.   Pt does want the Tramadol sent to the pharmacy, she states that she will try anything for the pain.  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

## 2019-02-19 NOTE — Telephone Encounter (Signed)
Pt aware of recommendations for BS and water intake. Pt aware that I will follow up with her on Monday to discuss her levels and how she did over the weekend.   Drinks 2 bottles during the day is about all she can get in - she drinks Gatorade and Lipton tea mix or juice(low sugar).  Pt is going to try to increase or intake during the daytime and will see if this helps her decrease the night time thirst and urination issues so that she can get better restful sleep.   Will follow up on Monday 2/8

## 2019-02-19 NOTE — Telephone Encounter (Signed)
Please call patient and let her know that I have sent in a refill of her albuterol inhaler. I'm sorry that her arm is bothering her so much. Is this something the neurologist has given her a diagnosis for? What is she taking for it? How are her blood sugars running?

## 2019-02-22 ENCOUNTER — Telehealth: Payer: Self-pay

## 2019-02-22 NOTE — Telephone Encounter (Signed)
I called the patients insurance and checked coverage and pre cert requirements. I spoke with Jeneen Rinks who stated that codes [phone] and [id number] were covered and no precert required. [id number]

## 2019-02-22 NOTE — Telephone Encounter (Signed)
LM for call back

## 2019-02-23 ENCOUNTER — Ambulatory Visit (INDEPENDENT_AMBULATORY_CARE_PROVIDER_SITE_OTHER): Payer: Medicare Other | Admitting: Neurology

## 2019-02-23 ENCOUNTER — Other Ambulatory Visit: Payer: Self-pay

## 2019-02-23 VITALS — Temp 97.4°F

## 2019-02-23 DIAGNOSIS — G43711 Chronic migraine without aura, intractable, with status migrainosus: Secondary | ICD-10-CM

## 2019-02-23 NOTE — Telephone Encounter (Signed)
Called patient to check in and see how her BS did over the weekend.  Pt states that she is now on 20 units of Insulin and BS are still ranging in the 300s   Pt was able to increase her water intake during the day which decreased her need for water at night as well as the amount of time she was having to get up and use the restroom. She states that the pain from her arms are still keeping her up at night - she is going to get a Botox injection today -- she states that we will receive that report.   Pt also states that she has been self treating a ?yeast  infection. Pt has been using Monistat 7-day kit. Pt has two days left on the kit and is having decreased itching. Pt states that she never had any discharge - only vaginal itching.   Will send to [facility] to make aware.

## 2019-02-23 NOTE — Progress Notes (Signed)
Botox consent signed today Botox- 200 units x 1 vial Lot: [id number] Expiration: 10/2021 NDC: [id number]  Bacteriostatic 0.9% Sodium Chloride- 23mL total Lot: [id number] Expiration: 04/15/2019 NDC: [id number]  Dx: [id number] B/B

## 2019-02-23 NOTE — Progress Notes (Signed)
This is our first botox    Consent Form Botulism Toxin Injection For Chronic Migraine    Reviewed orally with patient, additionally signature is on file:  Botulism toxin has been approved by the Federal drug administration for treatment of chronic migraine. Botulism toxin does not cure chronic migraine and it may not be effective in some patients.  The administration of botulism toxin is accomplished by injecting a small amount of toxin into the muscles of the neck and head. Dosage must be titrated for each individual. Any benefits resulting from botulism toxin tend to wear off after 3 months with a repeat injection required if benefit is to be maintained. Injections are usually done every 3-4 months with maximum effect peak achieved by about 2 or 3 weeks. Botulism toxin is expensive and you should be sure of what costs you will incur resulting from the injection.  The side effects of botulism toxin use for chronic migraine may include:   -Transient, and usually mild, facial weakness with facial injections  -Transient, and usually mild, head or neck weakness with head/neck injections  -Reduction or loss of forehead facial animation due to forehead muscle weakness  -Eyelid drooping  -Dry eye  -Pain at the site of injection or bruising at the site of injection  -Double vision  -Potential unknown long term risks  Contraindications: You should not have Botox if you are pregnant, nursing, allergic to albumin, have an infection, skin condition, or muscle weakness at the site of the injection, or have myasthenia gravis, Lambert-Eaton syndrome, or ALS.  It is also possible that as with any injection, there may be an allergic reaction or no effect from the medication. Reduced effectiveness after repeated injections is sometimes seen and rarely infection at the injection site may occur. All care will be taken to prevent these side effects. If therapy is given over a long time, atrophy and wasting  in the muscle injected may occur. Occasionally the patient's become refractory to treatment because they develop antibodies to the toxin. In this event, therapy needs to be modified.  I have read the above information and consent to the administration of botulism toxin.    BOTOX PROCEDURE NOTE FOR MIGRAINE HEADACHE    Contraindications and precautions discussed with patient(above). Aseptic procedure was observed and patient tolerated procedure. Procedure performed by Dr. Georgia Dom  The condition has existed for more than 6 months, and pt does not have a diagnosis of ALS, Myasthenia Gravis or Lambert-Eaton Syndrome.  Risks and benefits of injections discussed and pt agrees to proceed with the procedure.  Written consent obtained  These injections are medically necessary. Pt  receives good benefits from these injections. These injections do not cause sedations or hallucinations which the oral therapies may cause.  Description of procedure:  The patient was placed in a sitting position. The standard protocol was used for Botox as follows, with 5 units of Botox injected at each site:   -Procerus muscle, midline injection  -Corrugator muscle, bilateral injection  -Frontalis muscle, bilateral injection, with 2 sites each side, medial injection was performed in the upper one third of the frontalis muscle, in the region vertical from the medial inferior edge of the superior orbital rim. The lateral injection was again in the upper one third of the forehead vertically above the lateral limbus of the cornea, 1.5 cm lateral to the medial injection site.  -Temporalis muscle injection, 4 sites, bilaterally. The first injection was 3 cm above the tragus of the ear,  second injection site was 1.5 cm to 3 cm up from the first injection site in line with the tragus of the ear. The third injection site was 1.5-3 cm forward between the first 2 injection sites. The fourth injection site was 1.5 cm posterior  to the second injection site.   -Occipitalis muscle injection, 3 sites, bilaterally. The first injection was done one half way between the occipital protuberance and the tip of the mastoid process behind the ear. The second injection site was done lateral and superior to the first, 1 fingerbreadth from the first injection. The third injection site was 1 fingerbreadth superiorly and medially from the first injection site.  -Cervical paraspinal muscle injection, 2 sites, bilateral knee first injection site was 1 cm from the midline of the cervical spine, 3 cm inferior to the lower border of the occipital protuberance. The second injection site was 1.5 cm superiorly and laterally to the first injection site.  -Trapezius muscle injection was performed at 3 sites, bilaterally. The first injection site was in the upper trapezius muscle halfway between the inflection point of the neck, and the acromion. The second injection site was one half way between the acromion and the first injection site. The third injection was done between the first injection site and the inflection point of the neck.   Will return for repeat injection in 3 months.   200 units of Botox was used, any Botox not injected was wasted. The patient tolerated the procedure well, there were no complications of the above procedure.

## 2019-02-24 NOTE — Telephone Encounter (Signed)
Thank you for the update. Please call patient and tell her to continue to increase her insulin by two units when it is above 200. What are her daytime numbers? She should finish vaginal yeast treatment. If symptoms persist, she should come in for an examination.

## 2019-02-26 ENCOUNTER — Encounter: Payer: Self-pay | Admitting: Family Medicine

## 2019-02-26 ENCOUNTER — Ambulatory Visit (INDEPENDENT_AMBULATORY_CARE_PROVIDER_SITE_OTHER): Payer: Medicare Other | Admitting: Family Medicine

## 2019-02-26 ENCOUNTER — Other Ambulatory Visit: Payer: Self-pay

## 2019-02-26 ENCOUNTER — Ambulatory Visit (INDEPENDENT_AMBULATORY_CARE_PROVIDER_SITE_OTHER): Payer: Medicare Other | Admitting: Neurology

## 2019-02-26 VITALS — BP 112/72 | HR 81 | Temp 98.2°F | Ht 61.0 in | Wt 205.0 lb

## 2019-02-26 DIAGNOSIS — N898 Other specified noninflammatory disorders of vagina: Secondary | ICD-10-CM | POA: Diagnosis not present

## 2019-02-26 DIAGNOSIS — R519 Headache, unspecified: Secondary | ICD-10-CM

## 2019-02-26 DIAGNOSIS — G4719 Other hypersomnia: Secondary | ICD-10-CM

## 2019-02-26 DIAGNOSIS — N760 Acute vaginitis: Secondary | ICD-10-CM | POA: Diagnosis not present

## 2019-02-26 DIAGNOSIS — R350 Frequency of micturition: Secondary | ICD-10-CM

## 2019-02-26 DIAGNOSIS — B9689 Other specified bacterial agents as the cause of diseases classified elsewhere: Secondary | ICD-10-CM

## 2019-02-26 DIAGNOSIS — Z72820 Sleep deprivation: Secondary | ICD-10-CM

## 2019-02-26 DIAGNOSIS — M79601 Pain in right arm: Secondary | ICD-10-CM | POA: Diagnosis not present

## 2019-02-26 DIAGNOSIS — G4710 Hypersomnia, unspecified: Secondary | ICD-10-CM | POA: Diagnosis not present

## 2019-02-26 DIAGNOSIS — E1159 Type 2 diabetes mellitus with other circulatory complications: Secondary | ICD-10-CM

## 2019-02-26 DIAGNOSIS — G2581 Restless legs syndrome: Secondary | ICD-10-CM

## 2019-02-26 DIAGNOSIS — R232 Flushing: Secondary | ICD-10-CM

## 2019-02-26 DIAGNOSIS — Z794 Long term (current) use of insulin: Secondary | ICD-10-CM

## 2019-02-26 DIAGNOSIS — M542 Cervicalgia: Secondary | ICD-10-CM

## 2019-02-26 DIAGNOSIS — G43711 Chronic migraine without aura, intractable, with status migrainosus: Secondary | ICD-10-CM

## 2019-02-26 LAB — POC URINALSYSI DIPSTICK (AUTOMATED)
Bilirubin, UA: NEGATIVE
Blood, UA: NEGATIVE
Glucose, UA: POSITIVE — AB
Ketones, UA: NEGATIVE
Leukocytes, UA: NEGATIVE
Nitrite, UA: NEGATIVE
Protein, UA: NEGATIVE
Spec Grav, UA: 1.025 (ref 1.010–1.025)
Urobilinogen, UA: 0.2 E.U./dL
pH, UA: 6 (ref 5.0–8.0)

## 2019-02-26 NOTE — Telephone Encounter (Signed)
Pt coming in today for appt. Will address BS readings and insuline dosing when she gets here.

## 2019-02-26 NOTE — Telephone Encounter (Signed)
Thank you for the update. Patient needs to continue to increase insulin dose by 2 units every evening when blood sugar greater than 200. How much is she taking now? May need to change to twice a day if unable to get good control with once a day.

## 2019-02-26 NOTE — Progress Notes (Signed)
Subjective:    Patient ID: Autumn Valenzuela, female    DOB: 01-Dec-1960, 59 y.o.   MRN: [id number]  HPI Chief Complaint  Patient presents with  . Vaginal Itching    Pt c/o of ongoing vaginal issues. She does not feel that the Vaginal cream has gotten rid of it - She states that we will have to call her Daughter to schedule an appt for next week. No foul vaginal Odor but there is an abnormal smell she cannot describe. No brown discharge, secretions are clear and "watery" - Pt notes having itching and raw feeling on labia. At times patient notices some "clay-like" discharge that sit in the folds of her labia and around vaginal opening -- ?yeast.    This is a 59 yo female, accompanied by her daughter who presents today for vaginal symptoms. See cc above. Vaginal symptoms started about 4 weeks ago. Has done a 7 day course of monistat vaginal cream and suppositories with no improvement. Odor that is not fishy.   BS- blood sugars up and down. Has had some decreased appetite. Fasting blood sugars low 200s. Taking insulin before dinner meal. Daytime blood sugars running in 300s. Currently taking 22 units lantus and metformin xr 500 mg bid. Unable to tolerate higher dose of metformin due to diarrhea.  Patient's daughter reports some dietary indiscretion- drinking regular soda periodically. Eating sandwiches, basmati rice. Struggles with food choices when it is just her preparing her meals. Better when she is eating with family.   Urinary frequency with some urge incontinence. No dysuria.   Skin itching and dryness improved.   Right arm and neck pain. Has seen ortho and neuro for this. Recently received Botox injections for headaches, to have sleep study tonight then additional work up of arm/neck pain per patient. I have given her small amount of tramadol to be used primarily at bedtime and she reports some improvement of sleep. Sleep very disrupted due to inconsistent schedule, polyuria. This has been  addressed on prior telephone calls.    Review of Systems Per HPI    Objective:   Physical Exam Vitals reviewed. Exam conducted with a chaperone present Varney Daily, CMA).  Constitutional:      General: She is not in acute distress.    Appearance: Normal appearance. She is obese. She is not ill-appearing, toxic-appearing or diaphoretic.  HENT:     Head: Normocephalic and atraumatic.  Cardiovascular:     Rate and Rhythm: Normal rate.  Pulmonary:     Effort: Pulmonary effort is normal.  Genitourinary:    General: Normal vulva.     Exam position: Supine.     Labia:        Right: No rash, tenderness, lesion or injury.        Left: No rash, tenderness, lesion or injury.      Vagina: Vaginal discharge (scant, thin, white) present.     Comments: Introitus with mild erythema. Cervix surgically absent.  Skin:    General: Skin is warm and dry.  Neurological:     Mental Status: She is alert and oriented to person, place, and time.  Psychiatric:        Mood and Affect: Mood normal.        Behavior: Behavior normal.        Thought Content: Thought content normal.        Judgment: Judgment normal.       BP 112/72 (BP Location: Left Arm, Patient Position: Sitting, Cuff  Size: Large)   Pulse 81   Temp 98.2 F (36.8 C) (Temporal)   Ht 5\' 1"  (1.549 m)   Wt 205 lb (93 kg)   SpO2 96%   BMI 38.73 kg/m  Wt Readings from Last 3 Encounters:  02/26/19 205 lb (93 kg)  02/04/19 210 lb (95.3 kg)  02/01/19 208 lb (94.3 kg)   Results for orders placed or performed in visit on 02/26/19  POCT Urinalysis Dipstick (Automated)  Result Value Ref Range   Color, UA light yellow    Clarity, UA clear    Glucose, UA Positive (A) Negative   Bilirubin, UA neg    Ketones, UA neg    Spec Grav, UA 1.025 1.010 - 1.025   Blood, UA neg    pH, UA 6.0 5.0 - 8.0   Protein, UA Negative Negative   Urobilinogen, UA 0.2 0.2 or 1.0 E.U./dL   Nitrite, UA neg    Leukocytes, UA Negative Negative         Assessment & Plan:  1. Vaginal discharge - WET PREP BY MOLECULAR PROBE - POCT Urinalysis Dipstick (Automated)  2. Urinary frequency - no signs infection, discussed high SG and glucose - POCT Urinalysis Dipstick (Automated)  3. Right arm pain - traMADol (ULTRAM) 50 MG tablet; Take 1 tablet (50 mg total) by mouth every 6 (six) hours as needed for up to 5 days.  Dispense: 20 tablet; Refill: 0  4. Type 2 diabetes mellitus with other circulatory complication, with long-term current use of insulin ([place]) - have been instructing patient on need to increase her Lantus dosage and discussed her urinary frequency, polydipsia, pain and how it relates to her elevated blood sugars.  - encouraged dietary compliance - will follow up on endocrine referral placed a couple of weeks ago - I have asked her to call the office in one week and let me know how her blood sugars are running  This visit occurred during the SARS-CoV-2 public health emergency.  Safety protocols were in place, including screening questions prior to the visit, additional usage of staff PPE, and extensive cleaning of exam room while observing appropriate contact time as indicated for disinfecting solutions.    Clarene Reamer, FNP-BC  [facility]  02/27/2019 8:20 AM

## 2019-02-26 NOTE — Telephone Encounter (Signed)
Pt aware of recommendations. Pt states that the vaginal issues have been going on for too long. She does not feel that the Vaginal cream has gotten rid of it - She states that we will have to call her Daughter to schedule an appt for next week. No foul vaginal Odor but there is an abnormal smell she cannot describe. No brown discharge, secretions are clear and "watery" - Pt notes having itching and raw feeling on labia. At times patient notices some "clay-like" discharge that sit in the folds of her labia and around vaginal opening -- ?yeast.   Daytime readings, 200s-300s, fluctuating - higher at night. Some days she will get random 190s but mostly above 200- 300 at highest. Pt does not have exact readings to give.   Last night BS was around 329 The other night BS was in the 300s as well.   Seems to climb more at night - pt is not sure why. She is not snacking any more and is eating healthier choices (decreased sugar and low carb) and drinking more water and sugar free drinks.

## 2019-02-26 NOTE — Telephone Encounter (Signed)
Pt scheduled today at 4 pm.

## 2019-02-26 NOTE — Patient Instructions (Signed)
Good to see you today  Please call next Friday and give me an update on your blood sugars  I will notify you of wet prep results on Monday  [email or web address]  Youtube videos-  Dr. Sharman Cheek Dr. Alvester Chou

## 2019-02-27 ENCOUNTER — Encounter: Payer: Self-pay | Admitting: Family Medicine

## 2019-02-27 MED ORDER — TRAMADOL HCL 50 MG PO TABS: 50.0000 mg | ORAL_TABLET | Freq: Four times a day (QID) | ORAL | 0 refills | Status: AC | PRN

## 2019-03-01 LAB — WET PREP BY MOLECULAR PROBE
Candida species: NOT DETECTED
MICRO NUMBER:: 10146996
SPECIMEN QUALITY:: ADEQUATE
Trichomonas vaginosis: NOT DETECTED

## 2019-03-01 MED ORDER — METRONIDAZOLE 0.75 % VA GEL: 1.0000 | Freq: Every day | VAGINAL | 0 refills | Status: AC

## 2019-03-01 NOTE — Addendum Note (Signed)
Addended by: Clarene Reamer B on: 03/01/2019 03:39 PM   Modules accepted: Orders

## 2019-03-08 ENCOUNTER — Encounter: Payer: Self-pay | Admitting: Internal Medicine

## 2019-03-08 ENCOUNTER — Ambulatory Visit (INDEPENDENT_AMBULATORY_CARE_PROVIDER_SITE_OTHER): Payer: Medicare Other | Admitting: Internal Medicine

## 2019-03-08 ENCOUNTER — Other Ambulatory Visit: Payer: Self-pay

## 2019-03-08 VITALS — BP 118/68 | HR 71 | Temp 98.4°F | Wt 208.6 lb

## 2019-03-08 DIAGNOSIS — Z794 Long term (current) use of insulin: Secondary | ICD-10-CM | POA: Diagnosis not present

## 2019-03-08 DIAGNOSIS — E1165 Type 2 diabetes mellitus with hyperglycemia: Secondary | ICD-10-CM | POA: Diagnosis not present

## 2019-03-08 DIAGNOSIS — E1142 Type 2 diabetes mellitus with diabetic polyneuropathy: Secondary | ICD-10-CM

## 2019-03-08 MED ORDER — INSULIN GLARGINE 100 UNIT/ML SOLOSTAR PEN: 22.0000 [IU] | PEN_INJECTOR | Freq: Every day | SUBCUTANEOUS | 3 refills | Status: DC

## 2019-03-08 NOTE — Progress Notes (Signed)
Name: Autumn Valenzuela  MRN/ DOB: [id number], Sep 19, 1960   Age/ Sex: 59 y.o., female    PCP: Elby Beck, FNP   Reason for Endocrinology Evaluation: Type 2 Diabetes Mellitus     Date of Initial Endocrinology Visit: 03/08/2019     PATIENT IDENTIFIER: Autumn Valenzuela is a 59 y.o. female with a past medical history of T2Dm, bipolar disorder,migrane headaches  and dyslipidemia The patient presented for initial endocrinology clinic visit on 03/08/2019 for consultative assistance with her diabetes management.    HPI: Autumn Valenzuela is accompanied by her daughter today  Diagnosed with DM in 2018 Prior Medications tried/Intolerance: Glipizide, metformin,has been on insulin since 2018 Currently checking blood sugars 2x / day  Hypoglycemia episodes : no               Hemoglobin A1c has ranged from 6.7% in 2018, peaking at 14.6% in 2021. Patient required assistance for hypoglycemia: no Patient has required hospitalization within the last 1 year from hyper or hypoglycemia: no  In terms of diet, the patient eats 2 meals and snacks on oranges and tangerines. Drinks occasional sugar- sweetend bevershed    HOME DIABETES REGIMEN: Metformin 500 mg XR 1 daily  Lantus 14 units daily - but if Bg over 200 takes 2 extra    Statin: Yes ACE-I/ARB:no Prior Diabetic Education:yes   METER DOWNLOAD SUMMARY: Date range evaluated: 1/24-2/22/2021 Fingerstick Blood Glucose Tests = 21 Average Number Tests/Day = 0.7 Overall Mean FS Glucose = 291 Standard Deviation = 109.6  BG Ranges: Low = 120 High = 509  - Continue  Hypoglycemic Events/30 Days: BG < 50 = 0 Episodes of symptomatic severe hypoglycemia = 0   DIABETIC COMPLICATIONS: Microvascular complications:    Neuropathy  Denies: CKD, retinopathy   Last eye exam: Completed 2020  Macrovascular complications:   Denies: CAD, PVD, CVA   PAST HISTORY: Past Medical History:  Past Medical History:  Diagnosis Date  .  Allergy   . Anemia   . Anxiety   . Arthritis   . Back pain   . Bipolar disorder ([place])   . CVA (cerebral infarction)   . Depression   . Diabetes mellitus without complication ([place])   . High cholesterol   . Hypertension   . Left leg pain   . Migraine   . Sleep apnea   . Stroke [facility])    Past Surgical History:  Past Surgical History:  Procedure Laterality Date  . ABDOMINAL HYSTERECTOMY    . ANTERIOR LATERAL LUMBAR FUSION WITH PERCUTANEOUS SCREW 1 LEVEL Left 02/11/2018   Procedure: LEFT LATERAL LUMBAR  FOUR-FIVE INTERBODY FUSION WITH INSTRUMENTATION AND ALLOGRAFT;  Surgeon: Phylliss Bob, MD;  Location: [facility];  Service: Orthopedics;  Laterality: Left;  LEFT LATERAL LUMBAR  FOUR-FIVE INTERBODY FUSION WITH INSTRUMENTATION AND ALLOGRAFT  . BACK SURGERY     Lumbar fusion L5-S1  . DIABETES    . EYE SURGERY Bilateral   . KNEE SURGERY    . l 4-l5 status post lateral posterior fusion January 30,2020    . NEUROFORAMINAL STENOSIS Left    Severe L4-L5, involving the level above previous fusion.  Marland Kitchen PARTIAL HYSTERECTOMY    . RADICULOPATHY Left    L4, Secondary to an L4-L5 Spondylolisthesis  . SHOULDER SURGERY        Social History:  reports that she quit smoking about 3 years ago. She has never used smokeless tobacco. She reports current drug use. Drug: Marijuana. She reports that she does  not drink alcohol. Family History:  Family History  Problem Relation Age of Onset  . Diabetes Mother   . Stroke Father   . Cancer Paternal Aunt   . Cancer Paternal Aunt   . Diabetes Brother   . Colon cancer Neg Hx   . Esophageal cancer Neg Hx   . Rectal cancer Neg Hx   . Stomach cancer Neg Hx      HOME MEDICATIONS: Allergies as of 03/08/2019      Reactions   Cefazolin Swelling, Other (See Comments)   Rapid iv infusion caused swelling and burning at the iv site      Medication List       Accurate as of March 08, 2019  4:43 PM. If you have any questions, ask your nurse or doctor.          Accu-Chek FastClix Lancets Misc TEST 4 TIMES DAILY. DX E11.9   Accu-Chek Guide Control Liqd 1 each by In Vitro route every 30 (thirty) days. Needs Accu-check guide monitor system. DX. E11.9 What changed: additional instructions   Accu-Chek Guide test strip Generic drug: glucose blood Use to test blood sugar 4 times a day.  Dx: E11.9   albuterol 108 (90 Base) MCG/ACT inhaler Commonly known as: VENTOLIN HFA Inhale 2 puffs into the lungs daily as needed for wheezing or shortness of breath.   albuterol (2.5 MG/3ML) 0.083% nebulizer solution Commonly known as: PROVENTIL Take 2.5 mg by nebulization at bedtime as needed for wheezing.   ALPRAZolam 0.5 MG tablet Commonly known as: XANAX Take 1 tablet (0.5 mg total) by mouth daily as needed for anxiety.   amitriptyline 25 MG tablet Commonly known as: ELAVIL TAKE 1 TABLET(25 MG) BY MOUTH AT BEDTIME   atorvastatin 20 MG tablet Commonly known as: LIPITOR Take 1 tablet (20 mg total) by mouth daily.   Botox 100 units Solr injection Generic drug: botulinum toxin Type A PROVIDER TO INJECT 155 UNITS INTO THE MUSCLES OF THE HEAD AND NECK EVERY 3 MONTHS. DISCARD REMAINDER.   calcium-vitamin D 500-200 MG-UNIT tablet Commonly known as: OSCAL WITH D Take 1 tablet by mouth daily with breakfast.   DULoxetine 60 MG capsule Commonly known as: CYMBALTA Take 1 capsule (60 mg total) by mouth at bedtime.   ferrous sulfate 325 (65 FE) MG tablet Take 1 tablet (325 mg total) by mouth every other day.   fluticasone 50 MCG/ACT nasal spray Commonly known as: FLONASE Place 2 sprays into both nostrils daily. What changed:   when to take this  reasons to take this   gabapentin 300 MG capsule Commonly known as: NEURONTIN 1 capsule twice dailty   Insulin Glargine 100 UNIT/ML Solostar Pen Commonly known as: LANTUS Inject 10 Units into the skin daily. If blood sugar over 200, increase by 2 units up to 40 units. What changed: how much to take    Insulin Pen Needle 31G X 5 MM Misc 1 Units by Does not apply route at bedtime.   ketorolac 0.4 % Soln Commonly known as: ACULAR Place 1 drop into both eyes daily as needed (eye irritation).   levocetirizine 5 MG tablet Commonly known as: XYZAL Take 1 tablet (5 mg total) by mouth every evening.   loratadine 10 MG tablet Commonly known as: CLARITIN Take 1 tablet (10 mg total) by mouth daily. What changed:   when to take this  reasons to take this   meloxicam 15 MG tablet Commonly known as: MOBIC One tablet every 24 hours as  needed for inflammation.   metFORMIN 500 MG 24 hr tablet Commonly known as: GLUCOPHAGE-XR Take 1 tablet (500 mg total) by mouth 2 (two) times daily with a meal.   metroNIDAZOLE 0.75 % vaginal gel Commonly known as: METROGEL VAGINAL Place 1 Applicatorful vaginally at bedtime for 7 days.   tiZANidine 4 MG tablet Commonly known as: Zanaflex Take 1 tablet (4 mg total) by mouth 3 (three) times daily. Start 4 mg at night x 1 week then twice daily x 1 week and then three times daily   topiramate 100 MG tablet Commonly known as: TOPAMAX Take 1 tablet (100 mg total) by mouth 2 (two) times daily.   triamcinolone ointment 0.1 % Commonly known as: KENALOG Apply topically 2 (two) times daily. For no more than 10 days.   Vitamin D (Ergocalciferol) 1.25 MG (50000 UNIT) Caps capsule Commonly known as: DRISDOL Take 1 capsule (50,000 Units total) by mouth every 7 (seven) days.   Vitamin D3 1.25 MG (50000 UT) Tabs Take 1 tablet by mouth every 7 (seven) days.        ALLERGIES: Allergies  Allergen Reactions  . Cefazolin Swelling and Other (See Comments)    Rapid iv infusion caused swelling and burning at the iv site     REVIEW OF SYSTEMS: A comprehensive ROS was conducted with the patient and is negative except as per HPI and below:  Review of Systems  Gastrointestinal: Positive for diarrhea and nausea.  Neurological: Positive for tingling.       OBJECTIVE:   VITAL SIGNS: BP 118/68 (BP Location: Right Arm, Patient Position: Sitting, Cuff Size: Large)   Pulse 71   Temp 98.4 F (36.9 C)   Wt 208 lb 9.6 oz (94.6 kg)   SpO2 98%   BMI 39.41 kg/m    PHYSICAL EXAM:  General: Pt appears well and is in NAD  Neck: General: Supple without adenopathy or carotid bruits. Thyroid: Thyroid size normal.  No goiter or nodules appreciated. No thyroid bruit.  Lungs: Clear with good BS bilat with no rales, rhonchi, or wheezes  Heart: RRR with normal S1 and S2 and no gallops; no murmurs; no rub  Abdomen: Normoactive bowel sounds, soft, nontender, without masses or organomegaly palpable  Extremities:  Lower extremities - No pretibial edema. No lesions.  Skin: Normal texture and temperature to palpation  Neuro: MS is good with appropriate affect, pt is alert and Ox3    DM foot exam: deferred-    DATA REVIEWED:  Lab Results  Component Value Date   HGBA1C 14.6 (H) 02/04/2019   HGBA1C 8.7 (H) 10/07/2018   HGBA1C 12.2 (A) 06/22/2018   Lab Results  Component Value Date   MICROALBUR 1.3 10/07/2018   LDLCALC 133 (H) 10/07/2018   CREATININE 0.62 10/07/2018   Lab Results  Component Value Date   MICRALBCREAT 0.6 10/07/2018    Lab Results  Component Value Date   CHOL 202 (H) 10/07/2018   HDL 43.80 10/07/2018   LDLCALC 133 (H) 10/07/2018   TRIG 128.0 10/07/2018   CHOLHDL 5 10/07/2018        ASSESSMENT / PLAN / RECOMMENDATIONS:   1) Type 2 Diabetes Mellitus, Poorly controlled, With Neuropathic complications - Most recent A1c of 14.6 %. Goal A1c < 7.0 %.    Plan: GENERAL: I have discussed with the patient the pathophysiology of diabetes. We went over the natural progression of the disease. We talked about both insulin resistance and insulin deficiency. We stressed the importance of lifestyle  changes including diet and exercise. I explained the complications associated with diabetes including retinopathy, nephropathy, neuropathy as  well as increased risk of cardiovascular disease. We went over the benefit seen with glycemic control.    I explained to the patient that diabetic patients are at higher than normal risk for amputations.   She has made some lifestyle changes in the past few weeks, daughter has been meal planning and the patient is somewhat motivated but she does admit to slipping once in a while.  She is intolerant to higher doses of Metformin  We will increase the Lantus to a weight appropriate dose, patient instructed against adjusting Lantus dose based on her glucose.  MEDICATIONS: - Continue Metformin 500 mg 1 tablet with breakfast - Increase lantus to 22 units daily   EDUCATION / INSTRUCTIONS:  BG monitoring instructions: Patient is instructed to check her blood sugars 2 times a day, fasting and bedtime.  Call [facility] [facility] if: BG persistently < 70 or > 300. . I reviewed the Rule of 15 for the treatment of hypoglycemia in detail with the patient. Literature supplied.   2) Diabetic complications:   Eye: Does not have known diabetic retinopathy.   Neuro/ Feet: Does not have known diabetic peripheral neuropathy.  Renal: Patient does not have known baseline CKD. She is not an ACEI/ARB at present.    Follow-up in 8 weeks   Signed electronically by: Mack Guise, MD  [facility] End[facility]  [facility] [address] [place], [place] [address] Phone: [phone] FAX: [phone]   CC: Elby Beck, [place] [address] Phone: [phone]  Fax: [phone]    Return to Endocrinology clinic as below: Future Appointments  Date Time Provider [place]  03/25/2019  3:30 PM [facility]  04/23/2019 10:45 AM Marzetta Board, DPM [facility]  05/03/2019  9:30 AM Quoc Tome, Melanie Crazier, MD [facility] None  05/27/2019  3:00 PM Lomax, Amy, NP [facility] None

## 2019-03-08 NOTE — Patient Instructions (Addendum)
-   Continue Metformin 500 mg 1 tablet with breakfast - Increase lantus to 22 units daily     Choose healthy, lower carb lower calorie snacks: toss salad, cooked vegetables, cottage cheese, peanut butter, low fat cheese / string cheese, lower sodium deli meat, tuna salad or chicken salad     HOW TO TREAT LOW BLOOD SUGARS (Blood sugar LESS THAN 70 MG/DL)  Please follow the RULE OF 15 for the treatment of hypoglycemia treatment (when your (blood sugars are less than 70 mg/dL)    STEP 1: Take 15 grams of carbohydrates when your blood sugar is low, which includes:   3-4 GLUCOSE TABS  OR  3-4 OZ OF JUICE OR REGULAR SODA OR  ONE TUBE OF GLUCOSE GEL     STEP 2: RECHECK blood sugar in 15 MINUTES STEP 3: If your blood sugar is still low at the 15 minute recheck --> then, go back to STEP 1 and treat AGAIN with another 15 grams of carbohydrates.

## 2019-03-15 DIAGNOSIS — G4719 Other hypersomnia: Secondary | ICD-10-CM | POA: Insufficient documentation

## 2019-03-15 NOTE — Progress Notes (Signed)
  IMPRESSION:  1. No evidence of Obstructive Sleep Apnea (OSA), nor hypoxemia.  2. No Periodic Limb Movement Disorder (PLMD) 3. Mild Primary Snoring  RECOMMENDATIONS: No sleep disorder as cause of fatigue was identified.  1. The sleep fragmentation is most likely related to pain and reflects an orthopedic problem.

## 2019-03-15 NOTE — Procedures (Signed)
PATIENT'S NAME:  Autumn Valenzuela, Perusse DOB:      1960/07/18      MR#:    [id number]     DATE OF RECORDING: 02/26/2019 REFERRING M.D.:  Clarene Reamer, FNP Study Performed:   Expanded EEG Polysomnogram HISTORY:  Autumn Valenzuela is a [place] Stroke patient, 59 year- old, and has complaint of shoulder pain. She is a medical history of Allergy, Anemia, Anxiety, Arthritis, Back pain, Bipolar disorder ([place]), CVA (cerebral infarction), Depression, Diabetes mellitus without complication ([place]), High cholesterol, Hypertension, Left leg pain, Migraine, (?) Sleep apnea.   She had undergone a previous polysomnogram upon Dr. Clydene Fake orders on 11-27 2007: EKG showed bradycardia tendency, the EEG showed no evidence of seizure disorder. There were lots of periodic limb movements, and she had a REM latency of 229 minutes ( this is excessively long).  Sleep efficiency was only 75% of the recorded total time. Very fragmented sleep pattern at the time AHI was only 2.1/h no diagnosis of obstructive sleep apnea was made.    The patient endorsed the Epworth Sleepiness Scale at 11/24 points while taking power naps. FSS at 55/63 points- high degree of fatigue.   The patient's weight 210 pounds with a height of 61 (inches), resulting in a BMI of 39.5 kg/m2. The patient's neck circumference measured 16.25 inches.  CURRENT MEDICATIONS: Vit D, Kenalog, Topamax, Zanaflex, Zofran, Metformin, Mobic, Claritin, Xyzal, Insulin Pen, Lantus, Neurontin, Flonase, Diflucan, Cymbalta, Lipitor, Elavil, Xanax, Proventil   PROCEDURE:  This is a multichannel digital polysomnogram utilizing the Somnostar 11.2 system.  Electrodes and sensors were applied and monitored per AASM Specifications.   EEG, EOG, Chin and Limb EMG, were sampled at 200 Hz.  ECG, Snore and Nasal Pressure, Thermal Airflow, Respiratory Effort, CPAP Flow and Pressure, Oximetry was sampled at 50 Hz. Digital video and audio were recorded.      BASELINE STUDY: Lights Out was at 22:07 and Lights  On at 04:16.  Total recording time (TRT) was 369 minutes, with a total sleep time (TST) of 261.5 minutes.  The patient's sleep latency was 48 minutes. REM latency was 321 minutes. The sleep efficiency was low at 70.9 %.     SLEEP ARCHITECTURE: WASO (Wake after sleep onset) was 70 minutes.  There were 25 minutes in Stage N1, 198.5 minutes Stage N2, 27.5 minutes Stage N3 and 10.5 minutes in Stage REM.  The percentage of Stage N1 was 9.6%, Stage N2 was 75.9%, Stage N3 was 10.5% and Stage R (REM sleep) was 4.%.   RESPIRATORY ANALYSIS:  There were a total of 0 respiratory events:  0 obstructive apneas, 0 central apneas and 0 mixed apneas with a total of 0 apneas and an apnea index (AI) of 0 /hour. There were 0 hypopneas with a hypopnea index of 0 /hour. The patient also had 0 respiratory event related arousals (RERAs).     The total APNEA/HYPOPNEA INDEX (AHI) was 0/hour and the total RESPIRATORY DISTURBANCE INDEX was  0 /hour.  0 events occurred in REM sleep and 0 events in NREM. The REM AHI was 0 /hour, versus a non-REM AHI of 0. The patient spent 46 minutes of total sleep time in the supine position and 216 minutes in non-supine. The supine AHI was 0.0 versus a non-supine AHI of 0.0.  OXYGEN SATURATION & C02:  The Wake baseline 02 saturation was 94%, with the lowest being 90%. Time spent below 89% saturation equaled 0 minutes.  The arousals were noted as: 45 were spontaneous, 0 were associated  with PLMs, 0 were associated with respiratory events. The patient had a total of 0 Periodic Limb Movements.     Audio and video analysis did not show any abnormal or unusual movements, behaviors, phonations or vocalizations.    Very mild Snoring was noted. EKG was regular, EEG was normal for sleeping adult of 58.   Post-study, the patient indicated that sleep was the same as usual.   IMPRESSION:  1. No Obstructive Sleep Apnea (OSA), no hypoxemia.  2. No Periodic Limb Movement Disorder (PLMD) 3. Mild  Primary Snoring  RECOMMENDATIONS: No sleep disorder as cause of fatigue or headaches was identified.  1. The sleep fragmentation is most likely related to pain and reflects an orthopedic problem.   I certify that I have reviewed the entire raw data recording prior to the issuance of this report in accordance with the Standards of Accreditation of the American Academy of Sleep Medicine (AASM)  Larey Seat, MD Diplomat, American Board of Psychiatry and Neurology  Diplomat, American Board of Sleep Medicine Market researcher, [facility] [employer]

## 2019-03-16 ENCOUNTER — Other Ambulatory Visit: Payer: Self-pay | Admitting: Family Medicine

## 2019-03-16 ENCOUNTER — Telehealth: Payer: Self-pay | Admitting: Family Medicine

## 2019-03-16 ENCOUNTER — Telehealth: Payer: Self-pay | Admitting: Neurology

## 2019-03-16 MED ORDER — TRAMADOL HCL 50 MG PO TABS: 50.0000 mg | ORAL_TABLET | Freq: Three times a day (TID) | ORAL | 0 refills | Status: AC | PRN

## 2019-03-16 NOTE — Telephone Encounter (Signed)
Please call patient and let her know that we did not receive a refill request from her pharmacy. I have sent in a refill.

## 2019-03-16 NOTE — Telephone Encounter (Signed)
-----   Message from Larey Seat, MD sent at 03/15/2019 12:46 PM EST -----  IMPRESSION:  1. No evidence of Obstructive Sleep Apnea (OSA), nor hypoxemia.  2. No Periodic Limb Movement Disorder (PLMD) 3. Mild Primary Snoring  RECOMMENDATIONS: No sleep disorder as cause of fatigue was identified.  1. The sleep fragmentation is most likely related to pain and reflects an orthopedic problem.

## 2019-03-16 NOTE — Telephone Encounter (Signed)
Pt daughter aware Rx is ready.   Asking for results of sleep study and explanation of the "orthopedic pain" explained in the impression.  What are next steps? Referral? Treatment?  Pt daughter states that Dr Brett Fairy did not have anything to say much about next steps, just that her arm pain is not sleep related.   Dohmeier, Asencion Partridge, MD  03/15/2019 12:46 PM EST    IMPRESSION:  1. No evidence of Obstructive Sleep Apnea (OSA), nor hypoxemia.  2. No Periodic Limb Movement Disorder (PLMD) 3. Mild Primary Snoring  RECOMMENDATIONS: No sleep disorder as cause of fatigue was identified.  1. The sleep fragmentation is most likely related to pain and reflects an orthopedic problem.

## 2019-03-16 NOTE — Telephone Encounter (Signed)
Patient called today about a refill request She stated that her pharmacy called her and said they have not heard from our office about the refill request they sent over last week  Patient is needing her TRAMADOL , she is completely out of medication      [facility] [place], [place]

## 2019-03-16 NOTE — Telephone Encounter (Signed)
Called the pt and advised the POA of the sleep study results. There was no sign of sleep apnea or sleep disorder present. No hypoxia concerns, no concerns of restlessness in extremities or heart rate irregularities. The daughter verbalized understanding and had no further questions.

## 2019-03-16 NOTE — Telephone Encounter (Signed)
We never received a paper refill request or electronic request.   Last refilled Tamadol 50mg   02/27/2019, #20 x 0 refills.  Sig: Take 1 po every 6 hrs prn x 5 days.   Last OV 02/26/2019  Please advise, thanks.

## 2019-03-17 NOTE — Telephone Encounter (Signed)
I am not sure which neurologist is her primary neurologist but she needs to follow up with them regarding results and recommendations.

## 2019-03-17 NOTE — Telephone Encounter (Signed)
Pt daughter aware, they will call Neurology for recommendations.

## 2019-03-25 ENCOUNTER — Other Ambulatory Visit (INDEPENDENT_AMBULATORY_CARE_PROVIDER_SITE_OTHER): Payer: Medicare Other

## 2019-03-25 ENCOUNTER — Other Ambulatory Visit: Payer: Self-pay

## 2019-03-25 DIAGNOSIS — G2581 Restless legs syndrome: Secondary | ICD-10-CM

## 2019-03-25 DIAGNOSIS — R790 Abnormal level of blood mineral: Secondary | ICD-10-CM

## 2019-03-25 DIAGNOSIS — D649 Anemia, unspecified: Secondary | ICD-10-CM

## 2019-03-26 LAB — IBC + FERRITIN
Ferritin: 109.9 ng/mL (ref 10.0–291.0)
Iron: 55 ug/dL (ref 42–145)
Saturation Ratios: 20.1 % (ref 20.0–50.0)
Transferrin: 195 mg/dL — ABNORMAL LOW (ref 212.0–360.0)

## 2019-04-06 ENCOUNTER — Other Ambulatory Visit: Payer: Self-pay | Admitting: Neurology

## 2019-04-07 ENCOUNTER — Other Ambulatory Visit: Payer: Self-pay

## 2019-04-07 MED ORDER — TRAMADOL HCL 50 MG PO TABS: 50.0000 mg | ORAL_TABLET | Freq: Three times a day (TID) | ORAL | 0 refills | Status: DC | PRN

## 2019-04-07 NOTE — Telephone Encounter (Signed)
I contacted patient and she states that she does need this medication refilled. She states that she has 1 tablet left.

## 2019-04-07 NOTE — Telephone Encounter (Signed)
Please call patient and verify if she requested refill.

## 2019-04-07 NOTE — Telephone Encounter (Signed)
I received a refill request from [facility] for patient's Tramadol 50mg  tablets. This was last refilled on 03/16/19 for #20 with 0 refills. Patient was last seen 02/26/19 and has no upcoming appts.  Please advise.  Thanks!

## 2019-04-20 ENCOUNTER — Telehealth: Payer: Self-pay | Admitting: Family Medicine

## 2019-04-20 DIAGNOSIS — N949 Unspecified condition associated with female genital organs and menstrual cycle: Secondary | ICD-10-CM

## 2019-04-20 NOTE — Telephone Encounter (Signed)
Advised pt's daughter, LaPorsche, of PCP msg. Pt could be heard in the background. Pt reports she only go a little relief with the cream. Pt is requesting a referral to a gynocologist in [place].  Advised that office will f/u with the pt. LaPorsche verbalized understanding.

## 2019-04-20 NOTE — Telephone Encounter (Signed)
Please call patient and see if she got any relief from metrogel? I am not sure what is causing her itching. Would she like to see a gynecologist?

## 2019-04-20 NOTE — Telephone Encounter (Signed)
Patient stated that she is still having vaginal itching Patient stated she was prescribed a medication at her visit but does not remember the name of the medication. She stated that this does not seem to be helping and would like to know if there is something else she could be prescribed.

## 2019-04-20 NOTE — Telephone Encounter (Signed)
Please call patient and let her know that I have put in referral to gyn in [place]. In the meantime, use mild, unscented soap on area with warm (not hot) water, dry thoroughly, wear loose fitting, cotton bottoms.

## 2019-04-21 ENCOUNTER — Other Ambulatory Visit: Payer: Self-pay

## 2019-04-21 NOTE — Telephone Encounter (Signed)
Spoke to pt. She has an appt tomorrow with GYN

## 2019-04-22 ENCOUNTER — Telehealth: Payer: Self-pay | Admitting: Podiatry

## 2019-04-22 ENCOUNTER — Encounter: Payer: Self-pay | Admitting: Obstetrics and Gynecology

## 2019-04-22 ENCOUNTER — Ambulatory Visit (INDEPENDENT_AMBULATORY_CARE_PROVIDER_SITE_OTHER): Payer: Medicare Other | Admitting: Obstetrics and Gynecology

## 2019-04-22 VITALS — BP 124/76 | Ht 61.0 in | Wt 201.0 lb

## 2019-04-22 DIAGNOSIS — N898 Other specified noninflammatory disorders of vagina: Secondary | ICD-10-CM

## 2019-04-22 DIAGNOSIS — B3731 Acute candidiasis of vulva and vagina: Secondary | ICD-10-CM

## 2019-04-22 DIAGNOSIS — B373 Candidiasis of vulva and vagina: Secondary | ICD-10-CM | POA: Diagnosis not present

## 2019-04-22 LAB — WET PREP FOR TRICH, YEAST, CLUE

## 2019-04-22 MED ORDER — FLUCONAZOLE 150 MG PO TABS: 150.0000 mg | ORAL_TABLET | ORAL | 0 refills | Status: AC

## 2019-04-22 NOTE — Progress Notes (Signed)
   Autumn Valenzuela  July 20, 1960 [id number]  HPI The patient is a 59 y.o. G3P3 new patient who presents today for vaginal burning sensation. Not having vaginal bleeding. Not sexually active.  Feels 'fire' inside vagina.  Previous hysterectomy.  She was being treated for BV 02/26/2019 when she had a vaginal wet prep/molecular test indicate increased levels of Gardnerella. She used a vaginal gel prescribed to her (MetroGel) and when she used it she immediately felt worsening of the burning sensation that she was having at the time, which subsided in the interim.   She is now in the past several weeks having a lot of vaginal area itching and irritation without excessive discharge.  She is trying to bathe extra to use water to soothe the area circularly at night.    Past medical history,surgical history, problem list, medications, allergies, family history and social history were all reviewed and documented as reviewed in the EPIC chart.  ROS:  Feeling well. No dyspnea or chest pain on exertion.  No abdominal pain, change in bowel habits, black or bloody stools.  No urinary tract symptoms. GYN ROS:  no abnormal bleeding, pelvic pain, and minimal vaginal discharge  Physical Exam  BP 124/76   Ht 5\' 1"  (1.549 m)   Wt 201 lb (91.2 kg)   BMI 37.98 kg/m   General: Pleasant female, no acute distress, alert and oriented PELVIC EXAM: VULVA: normal appearing vulva with no masses, tenderness or lesions, VAGINA: normal appearing vagina with normal color but scant thick white curdled discharge is present, no lesions, CERVIX: Surgically absent, WET MOUNT done - results: +hyphae, no Trichomonas or clue cells, many WBC, moderate bacteria, 7-12 epithelial cells/hpf  Assessment 59 yo G3P3 with vaginal candidiasis  Plan I recommend fluconazole 150 mg p.o. every 72 hours for 2 doses.  Prescription is sent to her pharmacy. She is on a lot of medications and QT prolonging effects of fluconazole can be enhanced by  amitriptyline or albuterol but short course duration should be fine.  Also potential to increase the amount of NSAIDs such as meloxicam in the system but again short course duration.  Will let us know if the symptoms do not improve.  Joseph Pierini MD, FACOG 04/22/19

## 2019-04-22 NOTE — Telephone Encounter (Signed)
I'm calling about account number [id number]. I have a balance of $90 and I wanted to know if insurance didn't pay for it or what? I was told I shouldn't have a bill when I had my toes done. Can y'all give me a call please at your earliest convenience. Thank you.

## 2019-04-23 ENCOUNTER — Ambulatory Visit: Payer: Medicare Other | Admitting: Podiatry

## 2019-04-23 NOTE — Telephone Encounter (Signed)
I done called before. My number is [phone]. Its about the bill that I have. I have called [employer] and they want to know the date that the service, if you want to leave that on my answering machine that is fine. But um, if you can return my call please so I can find out a little bit more what they need to know. Thank you since no one wants to do their job, I have do y'all job for [employer], thank you.

## 2019-04-29 ENCOUNTER — Other Ambulatory Visit: Payer: Self-pay

## 2019-04-30 ENCOUNTER — Telehealth: Payer: Self-pay | Admitting: Family Medicine

## 2019-04-30 NOTE — Telephone Encounter (Signed)
Patient Called at 4:53 today and was asking about her Tramadol Rx and asking why we have not sent this to her pharmacy as she has been requesting this for over 2 weeks. I advised her after reviewing her chart that there are no new requests documented from her nor the pharmacy and that we refilled her medication on 04/08/2019 #20 x 0 refills to [facility] and we show confirmation that this was received by the pharmacy.  She stated that the pharmacy did not have this Rx and this needs to be handled now and resent bc she is out of her medications.   I placed the patient on hold and called [facility] to verify the Rx Spoke with pharmacist and he confirmed that the Rx was received on 04/08/19 and was picked up on 3/25/202.  I spoke back with the patient and relayed this information and she verified that she DID pick this up but that she does not understand why Jackelyn Poling thinks that #20 tablets are going to last her 30 days.  Pt states that she took as prescribed "every 6 hours", I advised that the way it is written is to be used "every 8 hrs PRN" not daily around the clock, she was quick to interrupt me stating that she is fully aware how to take her medications that she does not need me to explain this her. Pt had a very demeaning tone to her voice and was rude in how she spoke to me toward the end of our conversation. I advised that I would send a message to Jackelyn Poling letting her know that she is requesting a refill and that we will call her back - she interrupted again stating "No! You tell Jackelyn Poling that she needs to call me either tomorrow or Monday and get this straightened out! Explain to me how she feels #20 tablets is enough to last a month!" Pt stated that she is not seeing the pain Dr for a while and if Jackelyn Poling is going to be managing her pain until then, then they need to talk and figure this out. I advised again that I would let Jackelyn Poling know her Concerns and we will be in touch - she  responded "You tell her to call me!"  I spoke personally with Jackelyn Poling about this and requested this message prior to handling further.   Please advise, thanks.

## 2019-05-01 ENCOUNTER — Other Ambulatory Visit: Payer: Self-pay | Admitting: Family Medicine

## 2019-05-01 MED ORDER — TRAMADOL HCL 50 MG PO TABS: 50.0000 mg | ORAL_TABLET | Freq: Three times a day (TID) | ORAL | 0 refills | Status: DC | PRN

## 2019-05-01 NOTE — Telephone Encounter (Signed)
Refill sent to patient's pharmacy. I am out of the office and will call her when I return.

## 2019-05-03 ENCOUNTER — Other Ambulatory Visit: Payer: Self-pay

## 2019-05-03 ENCOUNTER — Ambulatory Visit (INDEPENDENT_AMBULATORY_CARE_PROVIDER_SITE_OTHER): Payer: Medicare Other | Admitting: Internal Medicine

## 2019-05-03 VITALS — BP 104/68 | HR 78 | Temp 98.2°F | Ht 61.0 in | Wt 200.4 lb

## 2019-05-03 DIAGNOSIS — Z794 Long term (current) use of insulin: Secondary | ICD-10-CM

## 2019-05-03 DIAGNOSIS — E1165 Type 2 diabetes mellitus with hyperglycemia: Secondary | ICD-10-CM

## 2019-05-03 LAB — POCT GLYCOSYLATED HEMOGLOBIN (HGB A1C): Hemoglobin A1C: 14.4 % — AB (ref 4.0–5.6)

## 2019-05-03 LAB — GLUCOSE, POCT (MANUAL RESULT ENTRY): POC Glucose: 529 mg/dl — AB (ref 70–99)

## 2019-05-03 NOTE — Progress Notes (Signed)
Name: Autumn Valenzuela  Age/ Sex: 59 y.o., female   MRN/ DOB: [id number], 30-Nov-1960     PCP: Elby Beck, FNP   Reason for Endocrinology Evaluation: Type 2 Diabetes Mellitus  Initial Endocrine Consultative Visit: 03/08/2019    PATIENT IDENTIFIER: Autumn Valenzuela is a 59 y.o. female with a past medical history of .T2Dm, bipolar disorder,migrane headaches  and dyslipidemia  The patient has followed with Endocrinology clinic since 03/08/2019 for consultative assistance with management of her diabetes.  DIABETIC HISTORY:  Autumn Valenzuela was diagnosed with T2DM in 2018.  She has been tried on oral glycemic agents such as glipizide and Metformin, she is intolerant to higher doses of Metformin.  She has been on insulin since 2018.  Her hemoglobin A1c has ranged from  6.7% in 2018, peaking at 14.6% in 2021   On her initial visit to our clinic she had an A1c of 14.6%, she was on metformin and Lantus.  SUBJECTIVE:   During the last visit (03/08/2019): A1c of 14.6%.  We continued metformin and increase Lantus.  Today (05/03/2019): [name] is here for follow-up on diabetes management, she is accompanied by her daughter today. She checks her blood sugars 1 times daily, preprandial to breakfast. Forgot meter today. The patient has not had hypoglycemic episodes since the last clinic visit.   HOME DIABETES REGIMEN:  Metformin 500 mg daily- skips it 2 days a week  Lantus 22 units daily- forgets to take it 2 tabs a day     Statin: Yes ACE-I/ARB: No    METER DOWNLOAD SUMMARY: Did not bring   DIABETIC COMPLICATIONS: Microvascular complications:    Neuropathy  Denies: CKD, retinopathy   Last eye exam: Completed 2020  Macrovascular complications:   Denies: CAD, PVD, CVA    HISTORY:  Past Medical History:  Past Medical History:  Diagnosis Date  . Allergy   . Anemia   . Anxiety   . Arthritis   . Back pain   . Bipolar disorder ([place])   . CVA (cerebral  infarction)   . Depression   . Diabetes mellitus without complication ([place])   . High cholesterol   . Hypertension   . Left leg pain   . Migraine   . Sleep apnea   . Stroke [facility])    Past Surgical History:  Past Surgical History:  Procedure Laterality Date  . ABDOMINAL HYSTERECTOMY    . ANTERIOR LATERAL LUMBAR FUSION WITH PERCUTANEOUS SCREW 1 LEVEL Left 02/11/2018   Procedure: LEFT LATERAL LUMBAR  FOUR-FIVE INTERBODY FUSION WITH INSTRUMENTATION AND ALLOGRAFT;  Surgeon: Phylliss Bob, MD;  Location: [facility];  Service: Orthopedics;  Laterality: Left;  LEFT LATERAL LUMBAR  FOUR-FIVE INTERBODY FUSION WITH INSTRUMENTATION AND ALLOGRAFT  . BACK SURGERY     Lumbar fusion L5-S1  . DIABETES    . EYE SURGERY Bilateral   . KNEE SURGERY    . l 4-l5 status post lateral posterior fusion January 30,2020    . NEUROFORAMINAL STENOSIS Left    Severe L4-L5, involving the level above previous fusion.  Marland Kitchen PARTIAL HYSTERECTOMY    . RADICULOPATHY Left    L4, Secondary to an L4-L5 Spondylolisthesis  . SHOULDER SURGERY      Social History:  reports that she quit smoking about 3 years ago. She has never used smokeless tobacco. She reports current alcohol use. She reports current drug use. Drug: Marijuana. Family History:  Family History  Problem Relation Age of Onset  . Diabetes Mother   .  Stroke Father   . Cancer Paternal Aunt   . Cancer Paternal Aunt   . Diabetes Brother   . Colon cancer Neg Hx   . Esophageal cancer Neg Hx   . Rectal cancer Neg Hx   . Stomach cancer Neg Hx      HOME MEDICATIONS: Allergies as of 05/03/2019      Reactions   Cefazolin Swelling, Other (See Comments)   Rapid iv infusion caused swelling and burning at the iv site      Medication List       Accurate as of May 03, 2019 10:20 AM. If you have any questions, ask your nurse or doctor.        Accu-Chek FastClix Lancets Misc TEST 4 TIMES DAILY. DX E11.9   Accu-Chek Guide Control Liqd 1 each by In Vitro route  every 30 (thirty) days. Needs Accu-check guide monitor system. DX. E11.9 What changed: additional instructions   Accu-Chek Guide test strip Generic drug: glucose blood Use to test blood sugar 4 times a day.  Dx: E11.9   albuterol 108 (90 Base) MCG/ACT inhaler Commonly known as: VENTOLIN HFA Inhale 2 puffs into the lungs daily as needed for wheezing or shortness of breath.   albuterol (2.5 MG/3ML) 0.083% nebulizer solution Commonly known as: PROVENTIL Take 2.5 mg by nebulization at bedtime as needed for wheezing.   ALPRAZolam 0.5 MG tablet Commonly known as: XANAX Take 1 tablet (0.5 mg total) by mouth daily as needed for anxiety.   amitriptyline 25 MG tablet Commonly known as: ELAVIL TAKE 1 TABLET(25 MG) BY MOUTH AT BEDTIME   atorvastatin 20 MG tablet Commonly known as: LIPITOR Take 1 tablet (20 mg total) by mouth daily.   Botox 100 units Solr injection Generic drug: botulinum toxin Type A PROVIDER TO INJECT 155 UNITS INTO THE MUSCLES OF THE HEAD AND NECK EVERY 3 MONTHS. DISCARD REMAINDER.   calcium-vitamin D 500-200 MG-UNIT tablet Commonly known as: OSCAL WITH D Take 1 tablet by mouth daily with breakfast.   DULoxetine 60 MG capsule Commonly known as: CYMBALTA Take 1 capsule (60 mg total) by mouth at bedtime.   ferrous sulfate 325 (65 FE) MG tablet Take 1 tablet (325 mg total) by mouth every other day.   fluticasone 50 MCG/ACT nasal spray Commonly known as: FLONASE Place 2 sprays into both nostrils daily. What changed:   when to take this  reasons to take this   gabapentin 300 MG capsule Commonly known as: NEURONTIN 1 capsule twice dailty   insulin glargine 100 UNIT/ML Solostar Pen Commonly known as: LANTUS Inject 22 Units into the skin daily.   Insulin Pen Needle 31G X 5 MM Misc 1 Units by Does not apply route at bedtime.   ketorolac 0.4 % Soln Commonly known as: ACULAR Place 1 drop into both eyes daily as needed (eye irritation).   levocetirizine 5  MG tablet Commonly known as: XYZAL Take 1 tablet (5 mg total) by mouth every evening.   loratadine 10 MG tablet Commonly known as: CLARITIN Take 1 tablet (10 mg total) by mouth daily. What changed:   when to take this  reasons to take this   meloxicam 15 MG tablet Commonly known as: MOBIC One tablet every 24 hours as needed for inflammation.   metFORMIN 500 MG 24 hr tablet Commonly known as: GLUCOPHAGE-XR Take 1 tablet (500 mg total) by mouth 2 (two) times daily with a meal.   tiZANidine 4 MG tablet Commonly known as: ZANAFLEX START 1  TABLET BY MOUTH AT NIGHT FOR 1 WEEK THEN 2 TIMES DAILY FOR 1 WEEK AND THEN TAKE 1 TABLET 3 TIMES DAILY THEREAFTER   topiramate 100 MG tablet Commonly known as: TOPAMAX Take 1 tablet (100 mg total) by mouth 2 (two) times daily.   traMADol 50 MG tablet Commonly known as: ULTRAM Take 1 tablet (50 mg total) by mouth every 8 (eight) hours as needed.   triamcinolone ointment 0.1 % Commonly known as: KENALOG Apply topically 2 (two) times daily. For no more than 10 days.   Vitamin D (Ergocalciferol) 1.25 MG (50000 UNIT) Caps capsule Commonly known as: DRISDOL Take 1 capsule (50,000 Units total) by mouth every 7 (seven) days.   Vitamin D3 1.25 MG (50000 UT) Tabs Take 1 tablet by mouth every 7 (seven) days.        OBJECTIVE:   Vital Signs: BP 104/68 (BP Location: Right Arm, Patient Position: Sitting, Cuff Size: Large)   Pulse 78   Temp 98.2 F (36.8 C)   Ht 5\' 1"  (1.549 m)   Wt 200 lb 6.4 oz (90.9 kg)   SpO2 99%   BMI 37.87 kg/m   Wt Readings from Last 3 Encounters:  05/03/19 200 lb 6.4 oz (90.9 kg)  04/22/19 201 lb (91.2 kg)  03/08/19 208 lb 9.6 oz (94.6 kg)     Exam: General: Pt appears well and is in NAD  Lungs: Clear with good BS bilat with no rales, rhonchi, or wheezes  Heart: RRR with normal S1 and S2 and no gallops; no murmurs; no rub  Extremities: No pretibial edema.   Neuro: MS is good with appropriate affect, pt is  alert and Ox3          DATA REVIEWED:  Lab Results  Component Value Date   HGBA1C 14.4 (A) 05/03/2019   HGBA1C 14.6 (H) 02/04/2019   HGBA1C 8.7 (H) 10/07/2018   Lab Results  Component Value Date   MICROALBUR 1.3 10/07/2018   LDLCALC 133 (H) 10/07/2018   CREATININE 0.62 10/07/2018   Lab Results  Component Value Date   MICRALBCREAT 0.6 10/07/2018     Lab Results  Component Value Date   CHOL 202 (H) 10/07/2018   HDL 43.80 10/07/2018   LDLCALC 133 (H) 10/07/2018   TRIG 128.0 10/07/2018   CHOLHDL 5 10/07/2018        In-Office BG 529 mg/dl  ASSESSMENT / PLAN / RECOMMENDATIONS:   1) Type 2 Diabetes Mellitus, poorly controlled, With neuropathic complications - Most recent A1c of 14.4 %. Goal A1c <7.0%.     - Not a whole lot of change in A1c despite increasing insulin , this is due to medication non-adherence. Unfortunately my role is limited in these instances and all I can do is continue to encourage her to stay compliant and use reminders. Daughter is with her and she assures me to will follow closely with mother and requests a f/u in 2 weeks  - They were encouraged to bring the meter in 2 weeks  - We did discuss add-on therapy with GLP-1 agonists but they would like to hold off on this at this time    MEDICATIONS: Continue Metformin 500 mg 1 tablet with breakfast Continue  lantus 22 units daily    EDUCATION / INSTRUCTIONS:  BG monitoring instructions: Patient is instructed to check her blood sugars 2 times a day, fasting and bedtime as much as possible  Call [facility] if: BG persistently < 70 or > 300. Marland Kitchen  I reviewed the Rule of 15 for the treatment of hypoglycemia in detail with the patient. Literature supplied.    2) Diabetic complications:   Eye: Does not have known diabetic retinopathy.   Neuro/ Feet: Does have known diabetic peripheral neuropathy.  Renal: Patient does not have known baseline CKD. She is not an ACEI/ARB at  present      F/U in 2 weeks    Signed electronically by: Mack Guise, MD  [facility] Endocrinology  [address] [place], [place] [address] Phone: [phone] FAX: [phone]   CC: Elby Beck, [place] [address] Phone: [phone]  Fax: [phone]  Return to Endocrinology clinic as below: Future Appointments  Date Time Provider [place]  05/17/2019  2:00 PM Jermaine Tholl, Melanie Crazier, MD [id number] None  05/27/2019  3:00 PM Lomax, Amy, NP [facility] None

## 2019-05-03 NOTE — Patient Instructions (Signed)
-   Continue Metformin 500 mg 1 tablet with breakfast - Continue  lantus 22 units daily     Choose healthy, lower carb lower calorie snacks: toss salad, cooked vegetables, cottage cheese, peanut butter, low fat cheese / string cheese, lower sodium deli meat, tuna salad or chicken salad     HOW TO TREAT LOW BLOOD SUGARS (Blood sugar LESS THAN 70 MG/DL)  Please follow the RULE OF 15 for the treatment of hypoglycemia treatment (when your (blood sugars are less than 70 mg/dL)    STEP 1: Take 15 grams of carbohydrates when your blood sugar is low, which includes:   3-4 GLUCOSE TABS  OR  3-4 OZ OF JUICE OR REGULAR SODA OR  ONE TUBE OF GLUCOSE GEL     STEP 2: RECHECK blood sugar in 15 MINUTES STEP 3: If your blood sugar is still low at the 15 minute recheck --> then, go back to STEP 1 and treat AGAIN with another 15 grams of carbohydrates.

## 2019-05-12 ENCOUNTER — Telehealth: Payer: Self-pay | Admitting: *Deleted

## 2019-05-12 NOTE — Telephone Encounter (Signed)
Patient has a Botox appointment on 05/27/2019.  I called [employer] [phone] and spoke to [place].  She states that [id number] and [phone] are valid and billable and do not require PA.  Eligible for B/B.  Ref# for call is [phone]

## 2019-05-13 ENCOUNTER — Other Ambulatory Visit: Payer: Self-pay

## 2019-05-14 ENCOUNTER — Telehealth: Payer: Self-pay

## 2019-05-14 DIAGNOSIS — Z794 Long term (current) use of insulin: Secondary | ICD-10-CM

## 2019-05-14 DIAGNOSIS — E1159 Type 2 diabetes mellitus with other circulatory complications: Secondary | ICD-10-CM

## 2019-05-14 MED ORDER — INSULIN PEN NEEDLE 31G X 5 MM MISC: 1.0000 [IU] | Freq: Every day | 3 refills | Status: DC

## 2019-05-14 NOTE — Telephone Encounter (Addendum)
[place] POA for pt said that pt has switched ins co back to the ins co that would send out pts meds to her home. Lap[place]hea wants all meds sent to pts home from that pharmacy; I asked the name of the pharmacy and [place] said she did not know but Glenda Chroman FNP would know or find out. Now pt only needs pen needles. I spoke with [place] CMA and she said Lapoeshea would need to ck with ins co to find out name of mail order pharmacy [place] wants [place] to send pts meds. I notified Lap[place] and she said I was to ask Glenda Chroman FNP not her assistant. I advised Lapoeshea that it was possible the previous mail order pharmacy could have been removed from pts pharmacy preferred list and pt has 3 mail order pharmacies and 2 local pharmacies on pts list and would Lapoeshea please contact pts ins co to verify which pharmacy should be used for pt. Throughout the call at times I could not understand what Lapoeshea was saying and after 1 - 2 attempts I could understand Lapoeshea clearly; Lapoeshea tone of voice and what she was saying indicated she was not pleased with what I was saying; at one point Lapoeshea advised me that she did not have time to do all this calling.Finally Lapoeshea said yes she would ck. I also asked which local pharmacy would she want pen needles sent to. [facility]  Pen needles sent as requested to [facility]. Lapoeshea voiced understanding. FYI to Glenda Chroman FNP.

## 2019-05-17 ENCOUNTER — Ambulatory Visit: Payer: Medicare Other | Admitting: Internal Medicine

## 2019-05-17 ENCOUNTER — Other Ambulatory Visit: Payer: Self-pay

## 2019-05-17 ENCOUNTER — Ambulatory Visit (INDEPENDENT_AMBULATORY_CARE_PROVIDER_SITE_OTHER): Payer: Medicare HMO | Admitting: Internal Medicine

## 2019-05-17 ENCOUNTER — Encounter: Payer: Self-pay | Admitting: Internal Medicine

## 2019-05-17 VITALS — BP 138/72 | HR 63 | Temp 98.4°F | Ht 61.0 in | Wt 200.0 lb

## 2019-05-17 DIAGNOSIS — E1142 Type 2 diabetes mellitus with diabetic polyneuropathy: Secondary | ICD-10-CM

## 2019-05-17 DIAGNOSIS — Z794 Long term (current) use of insulin: Secondary | ICD-10-CM | POA: Diagnosis not present

## 2019-05-17 DIAGNOSIS — E1165 Type 2 diabetes mellitus with hyperglycemia: Secondary | ICD-10-CM | POA: Diagnosis not present

## 2019-05-17 MED ORDER — INSULIN GLARGINE 100 UNIT/ML SOLOSTAR PEN: 26.0000 [IU] | PEN_INJECTOR | Freq: Every day | SUBCUTANEOUS | 6 refills | Status: DC

## 2019-05-17 MED ORDER — INSULIN GLARGINE 100 UNIT/ML SOLOSTAR PEN: 26.0000 [IU] | PEN_INJECTOR | Freq: Every day | SUBCUTANEOUS | 3 refills | Status: DC

## 2019-05-17 NOTE — Patient Instructions (Addendum)
-   Continue Metformin 500 mg 1 tablet with breakfast - Increase  lantus 26 units daily     - Check sugar before breakfast and at bedtime when you can      HOW TO TREAT LOW BLOOD SUGARS (Blood sugar LESS THAN 70 MG/DL)  Please follow the RULE OF 15 for the treatment of hypoglycemia treatment (when your (blood sugars are less than 70 mg/dL)    STEP 1: Take 15 grams of carbohydrates when your blood sugar is low, which includes:   3-4 GLUCOSE TABS  OR  3-4 OZ OF JUICE OR REGULAR SODA OR  ONE TUBE OF GLUCOSE GEL     STEP 2: RECHECK blood sugar in 15 MINUTES STEP 3: If your blood sugar is still low at the 15 minute recheck --> then, go back to STEP 1 and treat AGAIN with another 15 grams of carbohydrates.

## 2019-05-17 NOTE — Progress Notes (Signed)
Name: Autumn Valenzuela  Age/ Sex: 59 y.o., female   MRN/ DOB: [id number], 1960/06/24     PCP: Autumn Beck, FNP   Reason for Endocrinology Evaluation: Type 2 Diabetes Mellitus  Initial Endocrine Consultative Visit: 03/08/2019    PATIENT IDENTIFIER: Autumn Valenzuela is a 59 y.o. female with a past medical history of .T2Dm, bipolar disorder,migrane headaches  and dyslipidemia  The patient has followed with Endocrinology clinic since 03/08/2019 for consultative assistance with management of her diabetes.  DIABETIC HISTORY:  Autumn Valenzuela was diagnosed with T2DM in 2018.  She has been tried on oral glycemic agents such as glipizide and Metformin, she is intolerant to higher doses of Metformin.  She has been on insulin since 2018.  Her hemoglobin A1c has ranged from  6.7% in 2018, peaking at 14.6% in 2021   On her initial visit to our clinic she had an A1c of 14.6%, she was on metformin and Lantus.  SUBJECTIVE:   During the last visit (05/03/2019): A1c of 14.4 %.  We continued metformin and Lantus as she was not compliant with medication intake.  Today (05/17/2019): Autumn Valenzuela is here for follow-up on diabetes management,this is a short term follow up for hyperglycemia.  She is accompanied by her daughter today. She checks her blood sugars 1 times daily, before supper. She did bring her meter today   She is c/o feet pain and burning sensation   HOME DIABETES REGIMEN:  Metformin 500 mg daily Lantus 22 units daily   Statin: Yes ACE-I/ARB: No     METER DOWNLOAD SUMMARY: 4/4-05/17/2019 Fingerstick Blood Glucose Tests = 20 Average Number Tests/Day = 0.7 Overall Mean FS Glucose = 361 Standard Deviation = 98.6  BG Ranges: Low = 181 High = 587   Hypoglycemic Events/30 Days: BG < 50 = 0 Episodes of symptomatic severe hypoglycemia = 0       DIABETIC COMPLICATIONS: Microvascular complications:   Neuropathy  Denies: CKD, retinopathy   Last eye exam: Completed  2020  Macrovascular complications:   Denies: CAD, PVD, CVA    HISTORY:  Past Medical History:  Past Medical History:  Diagnosis Date  . Allergy   . Anemia   . Anxiety   . Arthritis   . Back pain   . Bipolar disorder ([place])   . CVA (cerebral infarction)   . Depression   . Diabetes mellitus without complication ([place])   . High cholesterol   . Hypertension   . Left leg pain   . Migraine   . Sleep apnea   . Stroke [facility])    Past Surgical History:  Past Surgical History:  Procedure Laterality Date  . ABDOMINAL HYSTERECTOMY    . ANTERIOR LATERAL LUMBAR FUSION WITH PERCUTANEOUS SCREW 1 LEVEL Left 02/11/2018   Procedure: LEFT LATERAL LUMBAR  FOUR-FIVE INTERBODY FUSION WITH INSTRUMENTATION AND ALLOGRAFT;  Surgeon: Phylliss Bob, MD;  Location: [place];  Service: Orthopedics;  Laterality: Left;  LEFT LATERAL LUMBAR  FOUR-FIVE INTERBODY FUSION WITH INSTRUMENTATION AND ALLOGRAFT  . BACK SURGERY     Lumbar fusion L5-S1  . DIABETES    . EYE SURGERY Bilateral   . KNEE SURGERY    . l 4-l5 status post lateral posterior fusion January 30,2020    . NEUROFORAMINAL STENOSIS Left    Severe L4-L5, involving the level above previous fusion.  Marland Kitchen PARTIAL HYSTERECTOMY    . RADICULOPATHY Left    L4, Secondary to an L4-L5 Spondylolisthesis  . SHOULDER SURGERY  Social History:  reports that she quit smoking about 3 years ago. She has never used smokeless tobacco. She reports current alcohol use. She reports current drug use. Drug: Marijuana. Family History:  Family History  Problem Relation Age of Onset  . Diabetes Mother   . Stroke Father   . Cancer Paternal Aunt   . Cancer Paternal Aunt   . Diabetes Brother   . Colon cancer Neg Hx   . Esophageal cancer Neg Hx   . Rectal cancer Neg Hx   . Stomach cancer Neg Hx      HOME MEDICATIONS: Allergies as of 05/17/2019      Reactions   Cefazolin Swelling, Other (See Comments)   Rapid iv infusion caused swelling and burning at the iv site        Medication List       Accurate as of May 17, 2019 12:04 PM. If you have any questions, ask your nurse or doctor.        Accu-Chek FastClix Lancets Misc TEST 4 TIMES DAILY. DX E11.9   Accu-Chek Guide Control Liqd 1 each by In Vitro route every 30 (thirty) days. Needs Accu-check guide monitor system. DX. E11.9 What changed: additional instructions   Accu-Chek Guide test strip Generic drug: glucose blood Use to test blood sugar 4 times a day.  Dx: E11.9   albuterol 108 (90 Base) MCG/ACT inhaler Commonly known as: VENTOLIN HFA Inhale 2 puffs into the lungs daily as needed for wheezing or shortness of breath.   albuterol (2.5 MG/3ML) 0.083% nebulizer solution Commonly known as: PROVENTIL Take 2.5 mg by nebulization at bedtime as needed for wheezing.   ALPRAZolam 0.5 MG tablet Commonly known as: XANAX Take 1 tablet (0.5 mg total) by mouth daily as needed for anxiety.   amitriptyline 25 MG tablet Commonly known as: ELAVIL TAKE 1 TABLET(25 MG) BY MOUTH AT BEDTIME   atorvastatin 20 MG tablet Commonly known as: LIPITOR Take 1 tablet (20 mg total) by mouth daily.   Botox 100 units Solr injection Generic drug: botulinum toxin Type A PROVIDER TO INJECT 155 UNITS INTO THE MUSCLES OF THE HEAD AND NECK EVERY 3 MONTHS. DISCARD REMAINDER.   calcium-vitamin D 500-200 MG-UNIT tablet Commonly known as: OSCAL WITH D Take 1 tablet by mouth daily with breakfast.   DULoxetine 60 MG capsule Commonly known as: CYMBALTA Take 1 capsule (60 mg total) by mouth at bedtime.   ferrous sulfate 325 (65 FE) MG tablet Take 1 tablet (325 mg total) by mouth every other day.   fluticasone 50 MCG/ACT nasal spray Commonly known as: FLONASE Place 2 sprays into both nostrils daily. What changed:   when to take this  reasons to take this   gabapentin 300 MG capsule Commonly known as: NEURONTIN 1 capsule twice dailty   insulin glargine 100 UNIT/ML Solostar Pen Commonly known as:  LANTUS Inject 26 Units into the skin daily. What changed: how much to take Changed by: Autumn Sciara, MD   Insulin Pen Needle 31G X 5 MM Misc 1 Units by Does not apply route at bedtime.   ketorolac 0.4 % Soln Commonly known as: ACULAR Place 1 drop into both eyes daily as needed (eye irritation).   levocetirizine 5 MG tablet Commonly known as: XYZAL Take 1 tablet (5 mg total) by mouth every evening.   loratadine 10 MG tablet Commonly known as: CLARITIN Take 1 tablet (10 mg total) by mouth daily. What changed:   when to take this  reasons  to take this   meloxicam 15 MG tablet Commonly known as: MOBIC One tablet every 24 hours as needed for inflammation.   metFORMIN 500 MG 24 hr tablet Commonly known as: GLUCOPHAGE-XR Take 1 tablet (500 mg total) by mouth 2 (two) times daily with a meal.   tiZANidine 4 MG tablet Commonly known as: ZANAFLEX START 1 TABLET BY MOUTH AT NIGHT FOR 1 WEEK THEN 2 TIMES DAILY FOR 1 WEEK AND THEN TAKE 1 TABLET 3 TIMES DAILY THEREAFTER   topiramate 100 MG tablet Commonly known as: TOPAMAX Take 1 tablet (100 mg total) by mouth 2 (two) times daily.   traMADol 50 MG tablet Commonly known as: ULTRAM Take 1 tablet (50 mg total) by mouth every 8 (eight) hours as needed.   triamcinolone ointment 0.1 % Commonly known as: KENALOG Apply topically 2 (two) times daily. For no more than 10 days.   Vitamin D (Ergocalciferol) 1.25 MG (50000 UNIT) Caps capsule Commonly known as: DRISDOL Take 1 capsule (50,000 Units total) by mouth every 7 (seven) days.   Vitamin D3 1.25 MG (50000 UT) Tabs Take 1 tablet by mouth every 7 (seven) days.        OBJECTIVE:   Vital Signs: BP 138/72 (BP Location: Left Arm, Patient Position: Sitting, Cuff Size: Large)   Pulse 63   Temp 98.4 F (36.9 C)   Ht 5\' 1"  (1.549 m)   Wt 200 lb (90.7 kg)   SpO2 99%   BMI 37.79 kg/m   Wt Readings from Last 3 Encounters:  05/17/19 200 lb (90.7 kg)  05/03/19 200 lb 6.4  oz (90.9 kg)  04/22/19 201 lb (91.2 kg)     Exam: General: Pt appears well and is in NAD  Lungs: Clear with good BS bilat with no rales, rhonchi, or wheezes  Heart: RRR with normal S1 and S2 and no gallops; no murmurs; no rub  Extremities: No pretibial edema.   Neuro: MS is good with appropriate affect, pt is alert and Ox3          DATA REVIEWED:  Lab Results  Component Value Date   HGBA1C 14.4 (A) 05/03/2019   HGBA1C 14.6 (H) 02/04/2019   HGBA1C 8.7 (H) 10/07/2018   Lab Results  Component Value Date   MICROALBUR 1.3 10/07/2018   LDLCALC 133 (H) 10/07/2018   CREATININE 0.62 10/07/2018   Lab Results  Component Value Date   MICRALBCREAT 0.6 10/07/2018     Lab Results  Component Value Date   CHOL 202 (H) 10/07/2018   HDL 43.80 10/07/2018   LDLCALC 133 (H) 10/07/2018   TRIG 128.0 10/07/2018   CHOLHDL 5 10/07/2018        In-Office BG 529 mg/dl  ASSESSMENT / PLAN / RECOMMENDATIONS:   1) Type 2 Diabetes Mellitus, poorly controlled, With neuropathic complications - Most recent A1c of 14.4 %. Goal A1c <7.0%.    - She brought her meter today, I have praised her on this and on glucose checks , she seems to be much more motivated. Her B's range from 180-300 's. But this is better then 500's.  - I am going to increase her lantus as below  - We did discuss add-on therapy with GLP-1 agonists in the future if needed, daughter very reluctant   MEDICATIONS: Continue Metformin 500 mg 1 tablet with breakfast Increase  lantus to 26 units daily    EDUCATION / INSTRUCTIONS:  BG monitoring instructions: Patient is instructed to check her blood sugars 2 times  a day, fasting and bedtime as much as possible  Call [place] [facility] if: BG persistently  > 300. . I reviewed the Rule of 15 for the treatment of hypoglycemia in detail with the patient. Literature supplied.    2) Diabetic complications:   Eye: Does not have known diabetic retinopathy.   Neuro/ Feet:  Does have known diabetic peripheral neuropathy.  Renal: Patient does not have known baseline CKD. She is not an ACEI/ARB at present      F/U in 3 months    Signed electronically by: Autumn Guise, MD  [facility]  [facility] [address] [place], [place] [address] Phone: [phone] FAX: [phone]   CC: Autumn Valenzuela, [place] [address] Phone: [phone]  Fax: [phone]  Return to Endocrinology clinic as below: Future Appointments  Date Time Provider [place]  05/19/2019 11:15 AM Gardiner Barefoot, DPM [facility]  05/27/2019  3:00 PM Debbora Presto, NP [facility] None  08/16/2019  3:40 PM Violetta Lavalle, Melanie Crazier, MD [facility] None

## 2019-05-19 ENCOUNTER — Ambulatory Visit (INDEPENDENT_AMBULATORY_CARE_PROVIDER_SITE_OTHER): Payer: Medicare HMO | Admitting: Podiatry

## 2019-05-19 ENCOUNTER — Encounter: Payer: Self-pay | Admitting: Podiatry

## 2019-05-19 ENCOUNTER — Other Ambulatory Visit: Payer: Self-pay

## 2019-05-19 DIAGNOSIS — E1165 Type 2 diabetes mellitus with hyperglycemia: Secondary | ICD-10-CM | POA: Diagnosis not present

## 2019-05-19 DIAGNOSIS — M79674 Pain in right toe(s): Secondary | ICD-10-CM

## 2019-05-19 DIAGNOSIS — B351 Tinea unguium: Secondary | ICD-10-CM

## 2019-05-19 DIAGNOSIS — E1110 Type 2 diabetes mellitus with ketoacidosis without coma: Secondary | ICD-10-CM | POA: Diagnosis not present

## 2019-05-19 DIAGNOSIS — E1140 Type 2 diabetes mellitus with diabetic neuropathy, unspecified: Secondary | ICD-10-CM

## 2019-05-19 DIAGNOSIS — Z794 Long term (current) use of insulin: Secondary | ICD-10-CM | POA: Diagnosis not present

## 2019-05-19 DIAGNOSIS — E1149 Type 2 diabetes mellitus with other diabetic neurological complication: Secondary | ICD-10-CM

## 2019-05-19 DIAGNOSIS — L608 Other nail disorders: Secondary | ICD-10-CM

## 2019-05-19 DIAGNOSIS — M79675 Pain in left toe(s): Secondary | ICD-10-CM | POA: Diagnosis not present

## 2019-05-19 NOTE — Progress Notes (Signed)
This patient returns to my office for at risk foot care.  This patient requires this care by a professional since this patient will be at risk due to having  Diabetes.  This patient is unable to cut nails herself since the patient cannot reach her nails.These nails are painful walking and wearing shoes.  This patient presents for at risk foot care today.  General Appearance  Alert, conversant and in no acute stress.  Vascular  Dorsalis pedis and posterior tibial  pulses are palpable  bilaterally.  Capillary return is within normal limits  bilaterally. Temperature is within normal limits  bilaterally.  Neurologic  Senn-Weinstein monofilament wire test within normal limits  bilaterally. Muscle power within normal limits bilaterally.  Nails Thick disfigured discolored nails with subungual debris  from hallux to fifth toes bilaterally.  Pincer nails left foot.   No evidence of bacterial infection or drainage bilaterally.  Orthopedic  No limitations of motion  feet .  No crepitus or effusions noted.  No bony pathology or digital deformities noted.  Skin  normotropic skin with no porokeratosis noted bilaterally.  No signs of infections or ulcers noted.     Onychomycosis  Pain in right toes  Pain in left toes  Consent was obtained for treatment procedures.   Mechanical debridement of nails 1-5  bilaterally performed with a nail nipper.  Filed with dremel without incident.    Return office visit                     Told patient to return for periodic foot care and evaluation due to potential at risk complications.   Gardiner Barefoot DPM

## 2019-05-27 ENCOUNTER — Other Ambulatory Visit: Payer: Self-pay

## 2019-05-27 ENCOUNTER — Telehealth: Payer: Self-pay | Admitting: Family Medicine

## 2019-05-27 ENCOUNTER — Ambulatory Visit (INDEPENDENT_AMBULATORY_CARE_PROVIDER_SITE_OTHER): Payer: Medicare HMO | Admitting: Family Medicine

## 2019-05-27 ENCOUNTER — Telehealth: Payer: Self-pay

## 2019-05-27 ENCOUNTER — Ambulatory Visit: Payer: Medicare HMO

## 2019-05-27 DIAGNOSIS — G43711 Chronic migraine without aura, intractable, with status migrainosus: Secondary | ICD-10-CM

## 2019-05-27 NOTE — Telephone Encounter (Signed)
Patient called requesting a referral to [facility]  She stated she was in their office today to receive her botox and spoke with the office about seeing her for nerve pain in her shoulder. They are requesting a referral in order to see her and the patient would like to know if you could send this for her    Also, Patient is requesting a refill on her Tramadol script. She said she is not able to get in with neuro until they have the referral she is needing her Tramadol to be refilled  [place]

## 2019-05-27 NOTE — Progress Notes (Signed)
Botox-100 units x 2 vials Lot: [id number] Expiration: 10/23 NDC: [id number]   0.9% Sodium Chloride- 42mL total [id number] Expiration: 9/22 NDC:  [id number]  Dx: Migraines B/B  Consent signed

## 2019-05-27 NOTE — Progress Notes (Signed)
She is doing fairly well. Migraines continue. She reports relief for about a week with last procedure. She is not sure she wishes to continue Botox. She is very frightened of the injections. She requests that her daughter be on the phone with her during procedure. She is following up with PCP regarding right neck and arm pain.    Consent Form Botulism Toxin Injection For Chronic Migraine    Reviewed orally with patient, additionally signature is on file:  Botulism toxin has been approved by the Federal drug administration for treatment of chronic migraine. Botulism toxin does not cure chronic migraine and it may not be effective in some patients.  The administration of botulism toxin is accomplished by injecting a small amount of toxin into the muscles of the neck and head. Dosage must be titrated for each individual. Any benefits resulting from botulism toxin tend to wear off after 3 months with a repeat injection required if benefit is to be maintained. Injections are usually done every 3-4 months with maximum effect peak achieved by about 2 or 3 weeks. Botulism toxin is expensive and you should be sure of what costs you will incur resulting from the injection.  The side effects of botulism toxin use for chronic migraine may include:   -Transient, and usually mild, facial weakness with facial injections  -Transient, and usually mild, head or neck weakness with head/neck injections  -Reduction or loss of forehead facial animation due to forehead muscle weakness  -Eyelid drooping  -Dry eye  -Pain at the site of injection or bruising at the site of injection  -Double vision  -Potential unknown long term risks   Contraindications: You should not have Botox if you are pregnant, nursing, allergic to albumin, have an infection, skin condition, or muscle weakness at the site of the injection, or have myasthenia gravis, Lambert-Eaton syndrome, or ALS.  It is also possible that as with any  injection, there may be an allergic reaction or no effect from the medication. Reduced effectiveness after repeated injections is sometimes seen and rarely infection at the injection site may occur. All care will be taken to prevent these side effects. If therapy is given over a long time, atrophy and wasting in the muscle injected may occur. Occasionally the patient's become refractory to treatment because they develop antibodies to the toxin. In this event, therapy needs to be modified.  I have read the above information and consent to the administration of botulism toxin.    BOTOX PROCEDURE NOTE FOR MIGRAINE HEADACHE  Contraindications and precautions discussed with patient(above). Aseptic procedure was observed and patient tolerated procedure. Procedure performed by Debbora Presto, FNP-C.   The condition has existed for more than 6 months, and pt does not have a diagnosis of ALS, Myasthenia Gravis or Lambert-Eaton Syndrome.  Risks and benefits of injections discussed and pt agrees to proceed with the procedure.  Written consent obtained  These injections are medically necessary. Pt  receives good benefits from these injections. These injections do not cause sedations or hallucinations which the oral therapies may cause.   Description of procedure:  The patient was placed in a sitting position. The standard protocol was used for Botox as follows, with 5 units of Botox injected at each site:  -Procerus muscle, midline injection  -Corrugator muscle, bilateral injection  -Frontalis muscle, bilateral injection, with 2 sites each side, medial injection was performed in the upper one third of the frontalis muscle, in the region vertical from the medial inferior  edge of the superior orbital rim. The lateral injection was again in the upper one third of the forehead vertically above the lateral limbus of the cornea, 1.5 cm lateral to the medial injection site.  -Temporalis muscle injection, 4 sites,  bilaterally. The first injection was 3 cm above the tragus of the ear, second injection site was 1.5 cm to 3 cm up from the first injection site in line with the tragus of the ear. The third injection site was 1.5-3 cm forward between the first 2 injection sites. The fourth injection site was 1.5 cm posterior to the second injection site. 5th site laterally in the temporalis  muscleat the level of the outer canthus.  -Occipitalis muscle injection, 3 sites, bilaterally. The first injection was done one half way between the occipital protuberance and the tip of the mastoid process behind the ear. The second injection site was done lateral and superior to the first, 1 fingerbreadth from the first injection. The third injection site was 1 fingerbreadth superiorly and medially from the first injection site.  -Cervical paraspinal muscle injection, 2 sites, bilaterally. The first injection site was 1 cm from the midline of the cervical spine, 3 cm inferior to the lower border of the occipital protuberance. The second injection site was 1.5 cm superiorly and laterally to the first injection site.  -Trapezius muscle injection was performed at 3 sites, bilaterally. The first injection site was in the upper trapezius muscle halfway between the inflection point of the neck, and the acromion. The second injection site was one half way between the acromion and the first injection site. The third injection was done between the first injection site and the inflection point of the neck.   Will return for repeat injection in 3 months.  *patient tolerated procedure well. We did stop half way for a break. She was able to take deep breaths and continue with procedure. She does wish to continue with one more treatment to see if migraines get better.   A total of 200 units of Botox was prepared, 155 units of Botox was injected as documented above, any Botox not injected was wasted. The patient tolerated the procedure well,  there were no complications of the above procedure.

## 2019-05-27 NOTE — Telephone Encounter (Signed)
Called patient trying to complete her medicare visit. Patient's daughter answered the phone and stated that the patient was at our office right now filling out paperwork for a visit with the doctor. I did not see an appointment on the schedule at our office for today for this patient. Appointment was cancelled and advised daughter we can reschedule at a later date that's more convenient for them.

## 2019-05-31 ENCOUNTER — Other Ambulatory Visit: Payer: Self-pay | Admitting: Family Medicine

## 2019-05-31 DIAGNOSIS — G8929 Other chronic pain: Secondary | ICD-10-CM

## 2019-05-31 DIAGNOSIS — M25511 Pain in right shoulder: Secondary | ICD-10-CM

## 2019-05-31 DIAGNOSIS — E1142 Type 2 diabetes mellitus with diabetic polyneuropathy: Secondary | ICD-10-CM

## 2019-05-31 DIAGNOSIS — Z794 Long term (current) use of insulin: Secondary | ICD-10-CM

## 2019-05-31 MED ORDER — TRAMADOL HCL 50 MG PO TABS: 50.0000 mg | ORAL_TABLET | Freq: Three times a day (TID) | ORAL | 0 refills | Status: DC | PRN

## 2019-05-31 NOTE — Progress Notes (Signed)
Neurology referral placed and tramadol prescription sent to patient's pharmacy.

## 2019-06-01 NOTE — Telephone Encounter (Signed)
Refill given and referral placed.

## 2019-06-01 NOTE — Telephone Encounter (Signed)
Noted  

## 2019-06-08 DIAGNOSIS — H26492 Other secondary cataract, left eye: Secondary | ICD-10-CM | POA: Diagnosis not present

## 2019-06-08 DIAGNOSIS — H04123 Dry eye syndrome of bilateral lacrimal glands: Secondary | ICD-10-CM | POA: Diagnosis not present

## 2019-06-08 DIAGNOSIS — Z961 Presence of intraocular lens: Secondary | ICD-10-CM | POA: Diagnosis not present

## 2019-06-08 DIAGNOSIS — H10413 Chronic giant papillary conjunctivitis, bilateral: Secondary | ICD-10-CM | POA: Diagnosis not present

## 2019-06-08 DIAGNOSIS — E119 Type 2 diabetes mellitus without complications: Secondary | ICD-10-CM | POA: Diagnosis not present

## 2019-06-08 DIAGNOSIS — H40013 Open angle with borderline findings, low risk, bilateral: Secondary | ICD-10-CM | POA: Diagnosis not present

## 2019-06-08 LAB — HM DIABETES EYE EXAM

## 2019-06-14 ENCOUNTER — Ambulatory Visit (HOSPITAL_COMMUNITY)
Admission: EM | Admit: 2019-06-14 | Discharge: 2019-06-14 | Disposition: A | Discharge status: Home / Self Care | Payer: Medicare HMO | Attending: Physician Assistant | Admitting: Physician Assistant

## 2019-06-14 ENCOUNTER — Encounter (HOSPITAL_COMMUNITY): Payer: Self-pay

## 2019-06-14 ENCOUNTER — Other Ambulatory Visit: Payer: Self-pay

## 2019-06-14 DIAGNOSIS — M79641 Pain in right hand: Secondary | ICD-10-CM

## 2019-06-14 DIAGNOSIS — M659 Synovitis and tenosynovitis, unspecified: Secondary | ICD-10-CM

## 2019-06-14 MED ORDER — DICLOFENAC SODIUM 1 % EX GEL: 4.0000 g | Freq: Two times a day (BID) | CUTANEOUS | 0 refills | Status: DC | PRN

## 2019-06-14 MED ORDER — NAPROXEN 500 MG PO TABS
500.0000 mg | ORAL_TABLET | Freq: Two times a day (BID) | ORAL | 0 refills | Status: DC
Start: 2019-06-14 — End: 2020-03-10

## 2019-06-14 MED ORDER — ACETAMINOPHEN 325 MG PO TABS
650.0000 mg | ORAL_TABLET | Freq: Four times a day (QID) | ORAL | 0 refills | Status: DC | PRN
Start: 2019-06-14 — End: 2020-11-22

## 2019-06-14 NOTE — ED Triage Notes (Signed)
Pt c/o 10/10 sharp right hand pain started yesterday. Pt states she did yard work yesterday and washed and detailed to vehicles today and now her hand hurts to try to close it. Pt states she can't use her right hand.

## 2019-06-14 NOTE — Discharge Instructions (Signed)
Wear the brace throughout the day and at night Take naproxen twice a day Take Tylenol 2 regular strength every 6 hours You may also use the diclofenac/Voltaren gel twice a day as needed on your hand in area of pain  Schedule follow-up with your primary care in about 1 week for reevaluation

## 2019-06-14 NOTE — ED Provider Notes (Signed)
[place]    CSN: [id number] Arrival date & time: 06/14/19  1544      History   Chief Complaint Chief Complaint  Patient presents with  . Hand Pain    HPI Autumn Valenzuela is a 59 y.o. female.   Patient with history of diabetes with diabetic peripheral neuropathy presents for acute 1 day history of right hand and wrist pain.  She reports pain in her right thumb going into her right wrist as well as pain along the outside of her right hand.  She reports pain through her knuckles and joints of her hand as well.  She reports she was working with her hands a lot yesterday and today.  She reports pain starting with a sharp and aching.  She reports if her hand is down feels throbbing but it is up feels better.  Denies any swelling or redness.  Denies injuring the hand.  Denies fever or chills.  Denies issues with hand like this before.  She does have a history of diabetic peripheral neuropathy however this is been primarily in the feet.  Denies issues with the left hand.     Past Medical History:  Diagnosis Date  . Allergy   . Anemia   . Anxiety   . Arthritis   . Back pain   . Bipolar disorder ([place])   . CVA (cerebral infarction)   . Depression   . Diabetes mellitus without complication ([place])   . High cholesterol   . Hypertension   . Left leg pain   . Migraine   . Sleep apnea   . Stroke [facility])     Patient Active Problem List   Diagnosis Date Noted  . Pincer nail deformity 05/19/2019  . Excessive daytime sleepiness 03/15/2019  . Type 2 diabetes mellitus with hyperglycemia, with long-term current use of insulin ([place]) 03/08/2019  . Type 2 diabetes mellitus with diabetic polyneuropathy, with long-term current use of insulin ([place]) 03/08/2019  . Diabetic neuropathy with neurologic complication ([place]) [id number]  . Claudication of both lower extremities ([place]) 02/04/2019  . RLS (restless legs syndrome) 02/04/2019  . Sleep deprivation 02/04/2019  . Hot flashes  02/04/2019  . Neck pain 02/04/2019  . Chronic migraine without aura, with intractable migraine, so stated, with status migrainosus 01/27/2019  . Acute pain of both knees 06/18/2018  . Radiculopathy 02/11/2018  . Bipolar disorder ([place]) 03/29/2016  . Diabetic ketoacidosis ([place]) 03/24/2016  . Dermatitis 03/24/2016  . HTN (hypertension) 03/24/2016  . HLD (hyperlipidemia) 03/24/2016  . DM (diabetes mellitus) ([place]) 03/24/2016  . Hyperglycemia   . Chronic daily headache 11/23/2012  . 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 Depression 11/23/2012    Past Surgical History:  Procedure Laterality Date  . ABDOMINAL HYSTERECTOMY    . ANTERIOR LATERAL LUMBAR FUSION WITH PERCUTANEOUS SCREW 1 LEVEL Left 02/11/2018   Procedure: LEFT LATERAL LUMBAR  FOUR-FIVE INTERBODY FUSION WITH INSTRUMENTATION AND ALLOGRAFT;  Surgeon: Phylliss Bob, MD;  Location: [place];  Service: Orthopedics;  Laterality: Left;  LEFT LATERAL LUMBAR  FOUR-FIVE INTERBODY FUSION WITH INSTRUMENTATION AND ALLOGRAFT  . BACK SURGERY     Lumbar fusion L5-S1  . DIABETES    . EYE SURGERY Bilateral   . KNEE SURGERY    . l 4-l5 status post lateral posterior fusion January 30,2020    . NEUROFORAMINAL STENOSIS Left    Severe L4-L5, involving the level above previous fusion.  Marland Kitchen PARTIAL HYSTERECTOMY    . RADICULOPATHY Left  L4, Secondary to an L4-L5 Spondylolisthesis  . SHOULDER SURGERY      OB History    Gravida  3   Para  3   Term      Preterm      AB      Living  3     SAB      TAB      Ectopic      Multiple      Live Births               Home Medications    Prior to Admission medications   Medication Sig Start Date End Date Taking? Authorizing Provider  Accu-Chek FastClix Lancets MISC TEST 4 TIMES DAILY. DX E11.9 07/16/18   Elby Beck, FNP  acetaminophen (TYLENOL) 325 MG tablet Take 2 tablets (650 mg total) by mouth every 6 (six) hours as needed. 06/14/19   Luismiguel Lamere, Marguerita Beards,  PA-C  albuterol (PROVENTIL) (2.5 MG/3ML) 0.083% nebulizer solution Take 2.5 mg by nebulization at bedtime as needed for wheezing.    [provider]  albuterol (VENTOLIN HFA) 108 (90 Base) MCG/ACT inhaler Inhale 2 puffs into the lungs daily as needed for wheezing or shortness of breath. 02/19/19   Elby Beck, FNP  ALPRAZolam Duanne Moron) 0.5 MG tablet Take 1 tablet (0.5 mg total) by mouth daily as needed for anxiety. 06/22/18   Elby Beck, FNP  amitriptyline (ELAVIL) 25 MG tablet TAKE 1 TABLET(25 MG) BY MOUTH AT BEDTIME 07/15/18   Elby Beck, FNP  atorvastatin (LIPITOR) 20 MG tablet Take 1 tablet (20 mg total) by mouth daily. 10/08/18   Elby Beck, FNP  Blood Glucose Calibration (ACCU-CHEK GUIDE CONTROL) LIQD 1 each by In Vitro route every 30 (thirty) days. Needs Accu-check guide monitor system. DX. E11.9 Patient taking differently: 1 each by In Vitro route every 30 (thirty) days. Needs Accu-check guide monitor system. DX. E11.9 2 times daily 07/15/18   Elby Beck, FNP  botulinum toxin Type A (BOTOX) 100 units SOLR injection PROVIDER TO INJECT 155 UNITS INTO THE MUSCLES OF THE HEAD AND NECK EVERY 3 MONTHS. DISCARD REMAINDER. 02/17/19   Melvenia Beam, MD  calcium-vitamin D (OSCAL WITH D) 500-200 MG-UNIT per tablet Take 1 tablet by mouth daily with breakfast.    [provider]  Cholecalciferol (VITAMIN D3) 1.25 MG (50000 UT) TABS Take 1 tablet by mouth every 7 (seven) days. 02/08/19   Elby Beck, FNP  diclofenac Sodium (VOLTAREN) 1 % GEL Apply 4 g topically 2 (two) times daily as needed. 06/14/19   Ellisa Devivo, Marguerita Beards, PA-C  DULoxetine (CYMBALTA) 60 MG capsule Take 1 capsule (60 mg total) by mouth at bedtime. 07/15/18   Elby Beck, FNP  ferrous sulfate 325 (65 FE) MG tablet Take 1 tablet (325 mg total) by mouth every other day. 02/08/19   Elby Beck, FNP  fluticasone (FLONASE) 50 MCG/ACT nasal spray Place 2 sprays into both nostrils daily.  Patient taking differently: Place 2 sprays into both nostrils daily as needed for allergies.  03/31/16   Julianne Rice, MD  gabapentin (NEURONTIN) 300 MG capsule 1 capsule twice dailty 10/20/18   Garvin Fila, MD  glucose blood (ACCU-CHEK GUIDE) test strip Use to test blood sugar 4 times a day.  Dx: E11.9 07/16/18   Elby Beck, FNP  insulin glargine (LANTUS) 100 UNIT/ML Solostar Pen Inject 26 Units into the skin daily. 05/17/19   Shamleffer, Melanie Crazier, MD  Insulin Pen Needle 31G X 5 MM MISC 1 Units by Does not apply route at bedtime. 05/14/19   Elby Beck, FNP  ketorolac (ACULAR) 0.4 % SOLN Place 1 drop into both eyes daily as needed (eye irritation).  04/08/16   [provider]  levocetirizine (XYZAL) 5 MG tablet Take 1 tablet (5 mg total) by mouth every evening. 04/20/18   Elby Beck, FNP  loratadine (CLARITIN) 10 MG tablet Take 1 tablet (10 mg total) by mouth daily. Patient taking differently: Take 10 mg by mouth daily as needed for allergies.  03/31/16   Julianne Rice, MD  meloxicam (MOBIC) 15 MG tablet One tablet every 24 hours as needed for inflammation. 07/15/18   Elby Beck, FNP  metFORMIN (GLUCOPHAGE-XR) 500 MG 24 hr tablet Take 1 tablet (500 mg total) by mouth 2 (two) times daily with a meal. 08/28/18   Elby Beck, FNP  naproxen (NAPROSYN) 500 MG tablet Take 1 tablet (500 mg total) by mouth 2 (two) times daily. 06/14/19   Alessa Mazur, Marguerita Beards, PA-C  tiZANidine (ZANAFLEX) 4 MG tablet START 1 TABLET BY MOUTH AT NIGHT FOR 1 WEEK THEN 2 TIMES DAILY FOR 1 WEEK AND THEN TAKE 1 TABLET 3 TIMES DAILY THEREAFTER 04/06/19   Garvin Fila, MD  topiramate (TOPAMAX) 100 MG tablet Take 1 tablet (100 mg total) by mouth 2 (two) times daily. 10/13/17   Elby Beck, FNP  traMADol (ULTRAM) 50 MG tablet Take 1 tablet (50 mg total) by mouth every 8 (eight) hours as needed. 05/31/19   Elby Beck, FNP  triamcinolone ointment (KENALOG) 0.1 % Apply topically 2  (two) times daily. For no more than 10 days. 06/22/18   Elby Beck, FNP  Vitamin D, Ergocalciferol, (DRISDOL) 50000 units CAPS capsule Take 1 capsule (50,000 Units total) by mouth every 7 (seven) days. 08/25/17   Elby Beck, FNP    Family History Family History  Problem Relation Age of Onset  . Diabetes Mother   . Stroke Father   . Cancer Paternal Aunt   . Cancer Paternal Aunt   . Diabetes Brother   . Colon cancer Neg Hx   . Esophageal cancer Neg Hx   . Rectal cancer Neg Hx   . Stomach cancer Neg Hx     Social History Social History   Tobacco Use  . Smoking status: Former Smoker    Quit date: 12/13/2015    Years since quitting: 3.5  . Smokeless tobacco: Never Used  Substance Use Topics  . Alcohol use: Yes    Comment: Occas  . Drug use: Not Currently    Types: Marijuana    Comment: last weed use 26 Dec 2018     Allergies   Cefazolin   Review of Systems Review of Systems  Per HPI Physical Exam Triage Vital Signs ED Triage Vitals  Enc Vitals Group     BP 06/14/19 1654 124/77     Pulse Rate 06/14/19 1654 78     Resp 06/14/19 1654 18     Temp 06/14/19 1654 98.1 F (36.7 C)     Temp Source 06/14/19 1654 Oral     SpO2 06/14/19 1654 98 %     Weight 06/14/19 1658 200 lb (90.7 kg)     Height 06/14/19 1658 5\' 1"  (1.549 m)     Head Circumference --      Peak Flow --      Pain Score 06/14/19 1657 10  Pain Loc --      Pain Edu? --      Excl. in [place]? --    No data found.  Updated Vital Signs BP 124/77   Pulse 78   Temp 98.1 F (36.7 C) (Oral)   Resp 18   Ht 5\' 1"  (1.549 m)   Wt 200 lb (90.7 kg)   SpO2 98%   BMI 37.79 kg/m   Visual Acuity Right Eye Distance:   Left Eye Distance:   Bilateral Distance:    Right Eye Near:   Left Eye Near:    Bilateral Near:     Physical Exam Vitals and nursing note reviewed.  Constitutional:      General: She is not in acute distress.    Appearance: She is well-developed.  HENT:     Head:  Normocephalic and atraumatic.  Eyes:     Conjunctiva/sclera: Conjunctivae normal.  Cardiovascular:     Rate and Rhythm: Normal rate.  Pulmonary:     Effort: Pulmonary effort is normal. No respiratory distress.  Musculoskeletal:     Comments: No ecchymosis, no edema or swelling.  No erythema.  Hand and wrist are not warm.  Cap refill less than 2 seconds.  Patient does have complete range of motion however is limited by pain.  Able to make a fist and open hand completely.  There is tenderness over the tendon sheath of the right thumb.  Finkelstein's is positive  Tenderness to palpation over the ulnar aspect of the wrist into musculature overlying the fifth metacarpal.  No significant joint tenderness in the phalanges or at the MCP joint.  There is no bony enlargement of the phalangeal joints or the MCP joints.  Sensation equal bilaterally.  Skin:    General: Skin is warm and dry.     Findings: No lesion or rash.     Comments: No wounds  Neurological:     Mental Status: She is alert.      UC Treatments / Results  Labs (all labs ordered are listed, but only abnormal results are displayed) Labs Reviewed - No data to display  EKG   Radiology No results found.  Procedures Procedures (including critical care time)  Medications Ordered in UC Medications - No data to display  Initial Impression / Assessment and Plan / UC Course  I have reviewed the triage vital signs and the nursing notes.  Pertinent labs & imaging results that were available during my care of the patient were reviewed by me and considered in my medical decision making (see chart for details).     #Tenosynovitis of right hand #Right hand pain Patient is a 59 year old with diabetes with diabetic peripheral neuropathy presenting with right hand pain and tenosynovitis of the right hand.  Pain is likely secondary to tendinitis or flare of arthritis.  Pain at the base of the thumb is consistent with a  tenosynovitis.  Patient's last A1c in April was 14.4, so will avoid steroids.  No significant cardiac history so we will trial naproxen and Tylenol.  Will place in thumb spica splint/brace.  Also recommended Voltaren topically.  Patient to follow-up with primary care for reevaluation about 1 week.  Patient verbalized understanding agreement with this plan. Final Clinical Impressions(s) / UC Diagnoses   Final diagnoses:  Tenosynovitis of right hand  Right hand pain     Discharge Instructions     Wear the brace throughout the day and at night Take naproxen twice a day Take Tylenol  2 regular strength every 6 hours You may also use the diclofenac/Voltaren gel twice a day as needed on your hand in area of pain  Schedule follow-up with your primary care in about 1 week for reevaluation     ED Prescriptions    Medication Sig Dispense Auth. Provider   naproxen (NAPROSYN) 500 MG tablet Take 1 tablet (500 mg total) by mouth 2 (two) times daily. 30 tablet Magdala Brahmbhatt, Marguerita Beards, PA-C   acetaminophen (TYLENOL) 325 MG tablet Take 2 tablets (650 mg total) by mouth every 6 (six) hours as needed. 30 tablet Rondo Spittler, Marguerita Beards, PA-C   diclofenac Sodium (VOLTAREN) 1 % GEL Apply 4 g topically 2 (two) times daily as needed. 50 g [name] Chenoweth, Marguerita Beards, PA-C     PDMP not reviewed this encounter.   Purnell Shoemaker, PA-C 06/14/19 1805

## 2019-06-29 ENCOUNTER — Telehealth: Payer: Self-pay | Admitting: Family Medicine

## 2019-06-29 DIAGNOSIS — G8929 Other chronic pain: Secondary | ICD-10-CM

## 2019-06-30 ENCOUNTER — Other Ambulatory Visit: Payer: Self-pay | Admitting: Family Medicine

## 2019-06-30 DIAGNOSIS — M791 Myalgia, unspecified site: Secondary | ICD-10-CM

## 2019-07-02 ENCOUNTER — Other Ambulatory Visit: Payer: Self-pay | Admitting: Family Medicine

## 2019-07-02 NOTE — Telephone Encounter (Signed)
Last refilled 05/31/2019 #20 x 0 refills Last OV 02/26/19 Acute  Please advise, thanks.

## 2019-07-05 ENCOUNTER — Other Ambulatory Visit: Payer: Self-pay | Admitting: Family Medicine

## 2019-07-05 DIAGNOSIS — M25511 Pain in right shoulder: Secondary | ICD-10-CM

## 2019-07-05 DIAGNOSIS — G8929 Other chronic pain: Secondary | ICD-10-CM

## 2019-07-06 NOTE — Telephone Encounter (Signed)
Patient called to check status of refill request. Patient asked if this could be done as soon as possible

## 2019-07-07 NOTE — Telephone Encounter (Signed)
There was a problem with e-prescribing and I printed the prescription to be faxed on 07/02/19. Please call pharmacy to see if it was received?

## 2019-07-08 NOTE — Telephone Encounter (Signed)
Called [facility] - they did not receive the Rx.  Verbal order given for this rx to be filled.   Nothing further needed.

## 2019-07-26 ENCOUNTER — Other Ambulatory Visit: Payer: Self-pay | Admitting: Family Medicine

## 2019-08-03 NOTE — Progress Notes (Addendum)
[facility]    Provider:  Dr Jaynee Eagles Requesting Provider: Elby Beck, FNP Primary Care Provider:  Elby Beck, FNP  CC:  Headaches and shoulder/wrist pain  History of present illness August 04, 2019: This is a 59 year old patient who I seen in the past for headaches, today she is here as a new request from Dr. Carlean Purl for diabetic polyneuropathy and chronic right shoulder pain.   Patient is here stating she has chronic aching, throbbing pain that goes down her arms into both arms and hands.  Constant in the elbows, she cannot sleep due to the pain. She has pain in the back of the shoulder and goes down tot he elbow to her hand, she thinks she has carpal tunnel in the left wrist but she has never been formally diagnosed. Her arms giving her problems for a few months or longer. She has seen her doctor about it. She can't open a bottle of soda with her hands. Ongoing for over a year but worsening int he last 3 months. A wrist brace helps. She can't lay on her arms in bed because it hurts, she has a lot of shoulder pain. We discussed CTS and we will perform an emg/ncs. Tramadol helps. No Hx of injury. No other focal neurologic deficits, associated symptoms, inciting events or modifiable factors.  HPI:  Autumn Valenzuela is a 59 y.o. female here as requested by my colleague Dr. Leonie Man for headaches. PMHx cerebrovascular disease, hypertension, diabetes, diabetic ketoacidosis, radiculopathy, chronic daily headache, depression, hyperlipidemia, bipolar disorder, hypertriglyceridemia.  Patient was last seen by my colleague in October 2020, she states that she has a lifelong history of migraines but following a stroke in 2007 the headaches had progressed to chronic daily headaches with a component of analgesic rebound and muscle tension.  She was also a patient of ours in 2015, she was started on Elavil, she was referred for polysomnogram for sleep apnea but that never happened,  the headaches are mostly in the top and initially to the back of the head and neck as well as down her shoulders into her arm, constant daily, 24 hours 7 days a week, she finds it difficult to sleep, she has to sleep with a specific position, Flexeril at night may help, she is taking gabapentin, Topamax.  Her blood pressure is in good control, she also states her sugars are also well controlled, she is on Lipitor, she does have slight anemia.  Dr. Leonie Man checked an MRI scan of the brain, increased her Zanaflex to 3 times daily, increased her gabapentin dose to 300 mg twice daily and discontinue Flexeril.  Headaches are every single day, migraines are every single day, continuous, all over the head, she wakes up with it, noise is too much, laughing hurts, light sensitivity, sound sensitivity, nausea. She uses pillows at night. Daughter is here and provides much information. She wakes a lot overnight, she watches her grandchildren all day. She is exhausted all day. Never feels refreshed. She tries to sleep all day. When she lays down it is around 230 and sleeps every day. She also has insomnia. She is constantly thinking of things, this keeps her up. She snores heavily, daughter can hear her, she wakes to urinate often. She wakes up with dry mouth. No aura. No medication overuse. Failed multiple medications. She had a sleep study years ago > 5 years ago, we have reviewed records in [place] and old system and cannot find it. Headaches are pulsating and  pounding, nausea, movement makes it worse, photo/phonophobia, daily for 24 hours for > 1 year.  Reviewed notes, labs and imaging from outside physicians, which showed:  I reviewed MRI of the brain images and agree with chronic bilateral cerebellar ischemic infarctions otherwise no acute findings.  MRI of the cervical spine was normal.  Review of Systems: Patient complains of symptoms per HPI as well as the following symptoms: arm pain, numbness and tingling .  Pertinent negatives and positives per HPI. All others negative    Social History   Socioeconomic History  . Marital status: Single    Spouse name: Not on file  . Number of children: 3  . Years of education: 42  . Highest education level: Not on file  Occupational History  . Not on file  Tobacco Use  . Smoking status: Former Smoker    Quit date: 12/13/2015    Years since quitting: 3.6  . Smokeless tobacco: Never Used  Vaping Use  . Vaping Use: Never used  Substance and Sexual Activity  . Alcohol use: Yes    Comment: Occas  . Drug use: Not Currently    Types: Marijuana    Comment: last weed use 26 Dec 2018  . Sexual activity: Not Currently    Comment: 1st intercourse 59 yo-Fewer than 5 partners  Other Topics Concern  . Not on file  Social History Narrative   Patient is single with 3 children.   Patient is right handed.   Patient has a high school education.   Patient drinks at least 3 cups of caffeine daily.   Social Determinants of Health   Financial Resource Strain:   . Difficulty of Paying Living Expenses:   Food Insecurity:   . Worried About Charity fundraiser in the Last Year:   . Arboriculturist in the Last Year:   Transportation Needs:   . Film/video editor (Medical):   Marland Kitchen Lack of Transportation (Non-Medical):   Physical Activity:   . Days of Exercise per Week:   . Minutes of Exercise per Session:   Stress:   . Feeling of Stress :   Social Connections:   . Frequency of Communication with Friends and Family:   . Frequency of Social Gatherings with Friends and Family:   . Attends Religious Services:   . Active Member of Clubs or Organizations:   . Attends Archivist Meetings:   Marland Kitchen Marital Status:   Intimate Partner Violence:   . Fear of Current or Ex-Partner:   . Emotionally Abused:   Marland Kitchen Physically Abused:   . Sexually Abused:     Family History  Problem Relation Age of Onset  . Diabetes Mother   . Stroke Father   . Cancer Paternal  Aunt   . Cancer Paternal Aunt   . Diabetes Brother   . Colon cancer Neg Hx   . Esophageal cancer Neg Hx   . Rectal cancer Neg Hx   . Stomach cancer Neg Hx     Past Medical History:  Diagnosis Date  . Allergy   . Anemia   . Anxiety   . Arthritis   . Back pain   . Bipolar disorder ([place])   . CVA (cerebral infarction)   . Depression   . Diabetes mellitus without complication ([place])   . High cholesterol   . Hypertension   . Left leg pain   . Migraine   . Sleep apnea   . Stroke [facility])  Patient Active Problem List   Diagnosis Date Noted  . Pincer nail deformity 05/19/2019  . Excessive daytime sleepiness 03/15/2019  . Type 2 diabetes mellitus with hyperglycemia, with long-term current use of insulin ([place]) 03/08/2019  . Type 2 diabetes mellitus with diabetic polyneuropathy, with long-term current use of insulin ([place]) 03/08/2019  . Diabetic neuropathy with neurologic complication ([place]) 67/61/9509  . Claudication of both lower extremities ([place]) 02/04/2019  . RLS (restless legs syndrome) 02/04/2019  . Sleep deprivation 02/04/2019  . Hot flashes 02/04/2019  . Neck pain 02/04/2019  . Chronic migraine without aura, with intractable migraine, so stated, with status migrainosus 01/27/2019  . Acute pain of both knees 06/18/2018  . Radiculopathy 02/11/2018  . Bipolar disorder ([place]) 03/29/2016  . Diabetic ketoacidosis ([place]) 03/24/2016  . Dermatitis 03/24/2016  . HTN (hypertension) 03/24/2016  . HLD (hyperlipidemia) 03/24/2016  . DM (diabetes mellitus) ([place]) 03/24/2016  . Hyperglycemia   . Chronic daily headache 11/23/2012  . 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 Depression 11/23/2012    Past Surgical History:  Procedure Laterality Date  . ABDOMINAL HYSTERECTOMY    . ANTERIOR LATERAL LUMBAR FUSION WITH PERCUTANEOUS SCREW 1 LEVEL Left 02/11/2018   Procedure: LEFT LATERAL LUMBAR  FOUR-FIVE INTERBODY FUSION WITH INSTRUMENTATION AND ALLOGRAFT;  Surgeon:  Phylliss Bob, MD;  Location: [facility];  Service: Orthopedics;  Laterality: Left;  LEFT LATERAL LUMBAR  FOUR-FIVE INTERBODY FUSION WITH INSTRUMENTATION AND ALLOGRAFT  . BACK SURGERY     Lumbar fusion L5-S1  . DIABETES    . EYE SURGERY Bilateral   . KNEE SURGERY    . l 4-l5 status post lateral posterior fusion January 30,2020    . NEUROFORAMINAL STENOSIS Left    Severe L4-L5, involving the level above previous fusion.  Marland Kitchen PARTIAL HYSTERECTOMY    . RADICULOPATHY Left    L4, Secondary to an L4-L5 Spondylolisthesis  . SHOULDER SURGERY      Current Outpatient Medications  Medication Sig Dispense Refill  . Accu-Chek FastClix Lancets MISC TEST 4 TIMES DAILY. DX E11.9 306 each 3  . acetaminophen (TYLENOL) 325 MG tablet Take 2 tablets (650 mg total) by mouth every 6 (six) hours as needed. 30 tablet 0  . albuterol (PROVENTIL) (2.5 MG/3ML) 0.083% nebulizer solution Take 2.5 mg by nebulization at bedtime as needed for wheezing.    Marland Kitchen albuterol (VENTOLIN HFA) 108 (90 Base) MCG/ACT inhaler Inhale 2 puffs into the lungs daily as needed for wheezing or shortness of breath. 18 g 2  . ALPRAZolam (XANAX) 0.5 MG tablet Take 1 tablet (0.5 mg total) by mouth daily as needed for anxiety. 30 tablet 2  . amitriptyline (ELAVIL) 25 MG tablet TAKE 1 TABLET(25 MG) BY MOUTH AT BEDTIME 90 tablet 1  . atorvastatin (LIPITOR) 20 MG tablet Take 1 tablet (20 mg total) by mouth daily. 90 tablet 3  . Blood Glucose Calibration (ACCU-CHEK GUIDE CONTROL) LIQD 1 each by In Vitro route every 30 (thirty) days. Needs Accu-check guide monitor system. DX. E11.9 (Patient taking differently: 1 each by In Vitro route every 30 (thirty) days. Needs Accu-check guide monitor system. DX. E11.9 2 times daily) 3 each 3  . botulinum toxin Type A (BOTOX) 100 units SOLR injection PROVIDER TO INJECT 155 UNITS INTO THE MUSCLES OF THE HEAD AND NECK EVERY 3 MONTHS. DISCARD REMAINDER. 2 each 1  . calcium-vitamin D (OSCAL WITH D) 500-200 MG-UNIT per tablet  Take 1 tablet by mouth daily with breakfast.    . Cholecalciferol (VITAMIN D3)  1.25 MG (50000 UT) TABS Take 1 tablet by mouth every 7 (seven) days. 12 tablet 3  . diclofenac Sodium (VOLTAREN) 1 % GEL Apply 4 g topically 2 (two) times daily as needed. 50 g 0  . DULoxetine (CYMBALTA) 60 MG capsule Take 1 capsule (60 mg total) by mouth at bedtime. 90 capsule 1  . ferrous sulfate 325 (65 FE) MG tablet Take 1 tablet (325 mg total) by mouth every other day. 45 tablet 3  . fluticasone (FLONASE) 50 MCG/ACT nasal spray Place 2 sprays into both nostrils daily. (Patient taking differently: Place 2 sprays into both nostrils daily as needed for allergies. ) 16 g 0  . gabapentin (NEURONTIN) 300 MG capsule 1 capsule twice dailty 60 capsule 1  . glucose blood (ACCU-CHEK GUIDE) test strip Use to test blood sugar 4 times a day.  Dx: E11.9 400 each 3  . insulin glargine (LANTUS) 100 UNIT/ML Solostar Pen Inject 26 Units into the skin daily. 15 mL 6  . Insulin Pen Needle 31G X 5 MM MISC 1 Units by Does not apply route at bedtime. 90 each 3  . ketorolac (ACULAR) 0.4 % SOLN Place 1 drop into both eyes daily as needed (eye irritation).     Marland Kitchen levocetirizine (XYZAL) 5 MG tablet Take 1 tablet (5 mg total) by mouth every evening. 30 tablet 2  . loratadine (CLARITIN) 10 MG tablet Take 1 tablet (10 mg total) by mouth daily. (Patient taking differently: Take 10 mg by mouth daily as needed for allergies. ) 30 tablet 0  . meloxicam (MOBIC) 15 MG tablet One tablet every 24 hours as needed for inflammation. 90 tablet 0  . metFORMIN (GLUCOPHAGE-XR) 500 MG 24 hr tablet Take 1 tablet (500 mg total) by mouth 2 (two) times daily with a meal. 180 tablet 1  . naproxen (NAPROSYN) 500 MG tablet Take 1 tablet (500 mg total) by mouth 2 (two) times daily. 30 tablet 0  . tiZANidine (ZANAFLEX) 4 MG tablet START 1 TABLET BY MOUTH AT NIGHT FOR 1 WEEK THEN 2 TIMES DAILY FOR 1 WEEK AND THEN TAKE 1 TABLET 3 TIMES DAILY THEREAFTER 90 tablet 2  .  topiramate (TOPAMAX) 100 MG tablet Take 1 tablet (100 mg total) by mouth 2 (two) times daily. 60 tablet 2  . traMADol (ULTRAM) 50 MG tablet TAKE 1 TABLET(50 MG) BY MOUTH EVERY 8 HOURS AS NEEDED 20 tablet 0  . triamcinolone ointment (KENALOG) 0.1 % Apply topically 2 (two) times daily. For no more than 10 days. 30 g 1  . Vitamin D, Ergocalciferol, (DRISDOL) 50000 units CAPS capsule Take 1 capsule (50,000 Units total) by mouth every 7 (seven) days. 12 capsule 3  . cyclobenzaprine (FLEXERIL) 10 MG tablet Take 10 mg by mouth every other day.     No current facility-administered medications for this visit.    Allergies as of 08/04/2019 - Review Complete 08/04/2019  Allergen Reaction Noted  . Cefazolin Swelling and Other (See Comments) 07/09/2011    Vitals: BP 131/89   Pulse 81   Ht 5\' 1"  (1.549 m)   Wt 197 lb (89.4 kg)   BMI 37.22 kg/m  Last Weight:  Wt Readings from Last 1 Encounters:  08/04/19 197 lb (89.4 kg)   Last Height:   Ht Readings from Last 1 Encounters:  08/04/19 5\' 1"  (1.549 m)    Physical exam: Exam: Gen: NAD, conversant, well nourised, obese, well groomed  CV: RRR, no MRG. No Carotid Bruits. No peripheral edema, warm, nontender Eyes: Conjunctivae clear without exudates or hemorrhage  Neuro: Detailed Neurologic Exam  Speech:    Speech is normal; fluent and spontaneous with normal comprehension.  Cognition:    The patient is oriented to person, place, and time;     recent and remote memory intact;     language fluent;     normal attention, concentration,     fund of knowledge Cranial Nerves:    The pupils are equal, round, and reactive to light. The fundi are flat. Visual fields are full to finger confrontation. Extraocular movements are intact. Trigeminal sensation is intact and the muscles of mastication are normal. The face is symmetric. The palate elevates in the midline. Hearing intact. Voice is normal. Shoulder shrug is normal. The tongue  has normal motion without fasciculations.   Coordination:    No dysmetria or ataxia  Gait:    Normal native gait  Motor Observation:    Atrophy median intrinsic hand muscles. Tone:    Normal muscle tone.    Posture:    Posture is normal. normal erect    Strength:    Weak grip and bilateral opponens.      Sensation: numbness in a median distribution     Reflex Exam:  DTR's:    Deep tendon reflexes in the upper and lower extremities are symmetrical bilaterally.   Toes:    The toes are equiv bilaterally.   Clonus:    Clonus is absent.     +Phalen's maneuver   Assessment/Plan: This is a really lovely patient here for shoulder and neck pain as well as numbness tingling in the fingers. We will order an EMG nerve conduction study .  We reviewed possible carpal tunnel syndrome today, answered all questions, we discussed treatment and surgical options if cts, I described the EMG nerve conduction study test will be doing, worked with the staff to squeeze patient in on Monday (first available was not till the end of August), we will get her in for EMG nerve conduction study and see if this is CTS or other mononeuropathy.  Addendum: Nerve conductions were very good essentially normal, the left showed possibly a mild/early left ulnar neuropathy however not severe enough to explain all her reported pain. We will ask her to decrease the Topiramate and see if any of the sensory symptoms are medication related and have her wear an elbow brace. If elbow brace does not improve symptoms will send to orthopaedics.   PRIOR Chronic daily migraines, memory loss, excessive daytime fatigue, obesity, hx of stroked  Needs sleep evaluation: Obesity, hx of strokes, excessive daytime somnolence, memory loss, morning headaches, dry mouth in morning, snores heavily. Had a sleep study in 2007, 13 years ago, see report. ESS 13, FSS 54. Needs repeat sleep evaluation.   Botox for migraines: Failed multiple  medications, will initiate botox. (failed elavil(amitriptyline), tizanidine, cymbalta, neurontin, topamax, verapamil, fluoxetine, and others). Will try to start decreasing her polypharmacy when initiate botox, may also consider Ajovy if needed.  Memory loss: May have sleep apnea, also polypharmacy especially multiple AEDs and TCA. Will try and decrease if headaches better with botox and sleep apnea eval/treatment  Orders Placed This Encounter  Procedures  . NCV with EMG(electromyography)   Cc: Elby Beck, FNP,    Sarina Ill, MD  [facility] [address] [place], [place] [address]  Phone [phone] Fax [phone]  A total of 40  minutes was spent face-to-face with this patient. Over half this time was spent on counseling patient on the  1. Numbness and tingling in both hands    diagnosis and different diagnostic and therapeutic options, counseling and coordination of care, risks ans benefits of management, compliance, or risk factor reduction and education.

## 2019-08-04 ENCOUNTER — Ambulatory Visit (INDEPENDENT_AMBULATORY_CARE_PROVIDER_SITE_OTHER): Payer: Medicare HMO | Admitting: Neurology

## 2019-08-04 ENCOUNTER — Encounter: Payer: Self-pay | Admitting: Neurology

## 2019-08-04 ENCOUNTER — Other Ambulatory Visit: Payer: Self-pay | Admitting: Family Medicine

## 2019-08-04 VITALS — BP 131/89 | HR 81 | Ht 61.0 in | Wt 197.0 lb

## 2019-08-04 DIAGNOSIS — R200 Anesthesia of skin: Secondary | ICD-10-CM

## 2019-08-04 DIAGNOSIS — G8929 Other chronic pain: Secondary | ICD-10-CM

## 2019-08-04 DIAGNOSIS — R202 Paresthesia of skin: Secondary | ICD-10-CM

## 2019-08-04 NOTE — Patient Instructions (Addendum)
If Carpal Tunnel Syndrome need testing and then probably surgery, need workup   Electromyoneurogram Electromyoneurogram is a test to check how well your muscles and nerves are working. This procedure includes the combined use of electromyogram (EMG) and nerve conduction study (NCS). EMG is used to look for muscular disorders. NCS, which is also called electroneurogram, measures how well your nerves are controlling your muscles. The procedures are usually done together to check if your muscles and nerves are healthy. If the results of the tests are abnormal, this may indicate disease or injury, such as a neuromuscular disease or peripheral nerve damage. Tell a health care provider about:  Any allergies you have.  All medicines you are taking, including vitamins, herbs, eye drops, creams, and over-the-counter medicines.  Any problems you or family members have had with anesthetic medicines.  Any blood disorders you have.  Any surgeries you have had.  Any medical conditions you have.  If you have a pacemaker.  Whether you are pregnant or may be pregnant. What are the risks? Generally, this is a safe procedure. However, problems may occur, including:  Infection where the electrodes were inserted.  Bleeding. What happens before the procedure? Medicines Ask your health care provider about:  Changing or stopping your regular medicines. This is especially important if you are taking diabetes medicines or blood thinners.  Taking medicines such as aspirin and ibuprofen. These medicines can thin your blood. Do not take these medicines unless your health care provider tells you to take them.  Taking over-the-counter medicines, vitamins, herbs, and supplements. General instructions  Your health care provider may ask you to avoid: ? Beverages that have caffeine, such as coffee and tea. ? Any products that contain nicotine or tobacco. These products include cigarettes, e-cigarettes, and  chewing tobacco. If you need help quitting, ask your health care provider.  Do not use lotions or creams on the same day that you will be having the procedure. What happens during the procedure? For EMG   Your health care provider will ask you to stay in a position so that he or she can access the muscle that will be studied. You may be standing, sitting, or lying down.  You may be given a medicine that numbs the area (local anesthetic).  A very thin needle that has an electrode will be inserted into your muscle.  Another small electrode will be placed on your skin near the muscle.  Your health care provider will ask you to continue to remain still.  The electrodes will send a signal that tells about the electrical activity of your muscles. You may see this on a monitor or hear it in the room.  After your muscles have been studied at rest, your health care provider will ask you to contract or flex your muscles. The electrodes will send a signal that tells about the electrical activity of your muscles.  Your health care provider will remove the electrodes and the electrode needles when the procedure is finished. The procedure may vary among health care providers and hospitals. For NCS   An electrode that records your nerve activity (recording electrode) will be placed on your skin by the muscle that is being studied.  An electrode that is used as a reference (reference electrode) will be placed near the recording electrode.  A paste or gel will be applied to your skin between the recording electrode and the reference electrode.  Your nerve will be stimulated with a mild shock. Your health  care provider will measure how much time it takes for your muscle to react.  Your health care provider will remove the electrodes and the gel when the procedure is finished. The procedure may vary among health care providers and hospitals. What happens after the procedure?  It is up to you to  get the results of your procedure. Ask your health care provider, or the department that is doing the procedure, when your results will be ready.  Your health care provider may: ? Give you medicines for any pain. ? Monitor the insertion sites to make sure that bleeding stops. Summary  Electromyoneurogram is a test to check how well your muscles and nerves are working.  If the results of the tests are abnormal, this may indicate disease or injury.  This is a safe procedure. However, problems may occur, such as bleeding and infection.  Your health care provider will do two tests to complete this procedure. One checks your muscles (EMG) and another checks your nerves (NCS).  It is up to you to get the results of your procedure. Ask your health care provider, or the department that is doing the procedure, when your results will be ready. This information is not intended to replace advice given to you by your health care provider. Make sure you discuss any questions you have with your health care provider. Document Revised: 09/16/2017 Document Reviewed: 08/29/2017 Elsevier Patient Education  [place].

## 2019-08-06 NOTE — Telephone Encounter (Signed)
Please advise  Last refill request 07/02/19 #20 x 0 refills

## 2019-08-06 NOTE — Telephone Encounter (Signed)
Please call patient and let her know that I did not receive a call regarding her refill two days ago. I have sent in a refill of her medication.

## 2019-08-06 NOTE — Telephone Encounter (Signed)
Patient called office stating she called office 7/21 for refill for tramadol 50 mg and hasnt heard anything yet. She said her pain in her arms and hands is very bad and she cant barley open her hands. She wants to get medication refilled asap for the chronic pain shes in. Please reach out to patient once refill is sent at [phone].

## 2019-08-09 ENCOUNTER — Ambulatory Visit (INDEPENDENT_AMBULATORY_CARE_PROVIDER_SITE_OTHER): Payer: Medicare HMO | Admitting: Neurology

## 2019-08-09 DIAGNOSIS — R200 Anesthesia of skin: Secondary | ICD-10-CM

## 2019-08-09 DIAGNOSIS — R202 Paresthesia of skin: Secondary | ICD-10-CM

## 2019-08-09 DIAGNOSIS — Z0289 Encounter for other administrative examinations: Secondary | ICD-10-CM

## 2019-08-09 DIAGNOSIS — G5622 Lesion of ulnar nerve, left upper limb: Secondary | ICD-10-CM

## 2019-08-09 NOTE — Telephone Encounter (Signed)
Daughter notified as instructed by telephone.

## 2019-08-09 NOTE — Progress Notes (Signed)
Full Name: Autumn Valenzuela Gender: Female MRN #: [id number] Date of Birth: 20-Jul-1960    Visit Date: 08/09/2019 15:45 Age: 59 Years Examining Physician: Sarina Ill, MD  Referring Physician: Sarina Ill, MD  History: Numbness and tingling in the fingers, left > right. Left arm hurts at the elbow with radiation down to the fingers.     Summary: The left Ulnar motor nerve showed a 27m/s drop(N<10) in conduction velocity across the left elbow. All remaining nerves (as indicated in the following tables) were within normal limits.  All muscles (as indicated in the following tables) were within normal limits.       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  No suggestion of polyneuropathy or cervical radiculopathy.   Sarina Ill M.D.  [facility] [place], [place] [address] Tel: [phone] Fax: [phone]  Verbal informed consent was obtained from the patient, patient was informed of potential risk of procedure, including bruising, bleeding, hematoma formation, infection, muscle weakness, muscle pain, numbness, among others.         [place]    Nerve / Sites Muscle Latency Ref. Amplitude Ref. Rel Amp Segments Distance Velocity Ref. Area    ms ms mV mV %  cm m/s m/s mVms  R Median - APB     Wrist APB 3.6 ?4.4 6.3 ?4.0 100 Wrist - APB 7   17.0     Upper arm APB 7.8  5.2  82.2 Upper arm - Wrist 21 50 ?49 15.5  L Median - APB     Wrist APB 3.9 ?4.4 7.9 ?4.0 100 Wrist - APB 7   21.0     Upper arm APB 8.0  7.7  98.5 Upper arm - Wrist 20 49 ?49 21.5  R Ulnar - ADM     Wrist ADM 2.7 ?3.3 9.2 ?6.0 100 Wrist - ADM 7   27.9     B.Elbow ADM 6.6  7.8  84.7 B.Elbow - Wrist 19 49 ?49 27.2     A.Elbow ADM 8.6  6.6  84.5 A.Elbow - B.Elbow 10 49 ?49 22.7         A.Elbow - Wrist      L Ulnar - ADM     Wrist ADM 3.0 ?3.3 8.9 ?6.0 100 Wrist - ADM 7   28.7     B.Elbow ADM 6.9  7.8  87.3 B.Elbow  - Wrist 19 49 ?49 27.7     A.Elbow ADM 9.6  6.8  87.1 A.Elbow - B.Elbow 10 36 ?49 27.1         A.Elbow - Wrist                 SSR      SNC    Nerve / Sites Rec. Site Peak Lat Ref.  Amp Ref. Segments Distance Peak Diff Ref.    ms ms V V  cm ms ms  R Median, Ulnar - Transcarpal comparison     Median Palm Wrist 2.1 ?2.2 101 ?35 Median Palm - Wrist 8       Ulnar Palm Wrist 1.9 ?2.2 13 ?12 Ulnar Palm - Wrist 8          Median Palm - Ulnar Palm  0.2 ?0.4  L Median, Ulnar - Transcarpal comparison     Median Palm Wrist 2.3 ?2.2 108 ?35 Median Palm - Wrist 8       Ulnar  Palm Wrist 1.9 ?2.2 19 ?12 Ulnar Palm - Wrist 8          Median Palm - Ulnar Palm  0.4 ?0.4  R Median - Orthodromic (Dig II, Mid palm)     Dig II Wrist 3.2 ?3.4 17 ?10 Dig II - Wrist 13    L Median - Orthodromic (Dig II, Mid palm)     Dig II Wrist 3.4 ?3.4 21 ?10 Dig II - Wrist 13    R Ulnar - Orthodromic, (Dig V, Mid palm)     Dig V Wrist 2.5 ?3.1 8 ?5 Dig V - Wrist 11    L Ulnar - Orthodromic, (Dig V, Mid palm)     Dig V Wrist 2.8 ?3.1 9 ?5 Dig V - Wrist 38                   F  Wave    Nerve F Lat Ref.   ms ms  R Ulnar - ADM 31.2 ?32.0  L Ulnar - ADM 31.9 ?32.0         EMG Summary Table    Spontaneous MUAP Recruitment  Muscle IA Fib PSW Fasc Other Amp Dur. Poly Pattern  L. Deltoid Normal None None None _______ Normal Normal Normal Normal  L. Triceps brachii Normal None None None _______ Normal Normal Normal Normal  L. Flexor digitorum profundus (Ulnar) Normal None None None _______ Normal Normal Normal Normal  L. Pronator teres Normal None None None _______ Normal Normal Normal Normal  L. First dorsal interosseous Normal None None None _______ Normal Normal Normal Normal  L. Opponens pollicis Normal None None None _______ Normal Normal Normal Normal  L. Cervical paraspinals (low) Normal None None None _______ Normal Normal Normal Normal

## 2019-08-09 NOTE — Progress Notes (Signed)
See procedure note.

## 2019-08-09 NOTE — Progress Notes (Signed)
Patient is here with daughter today for evaluation of numbness and tingling in the left greater than right arm.  The left arm hurts at the elbow and radiates down to the fingers and up into the deltoid, the right hand all fingers are numb and tingly.  She also has numbness and tingling in her feet.  Nerve conductions were very good essentially normal, the left showed possibly a mild/early left ulnar neuropathy however not severe enough to explain all her reported pain.   We discussed ulnar neuropathy, anatomy and pathophysiology of symptoms, we discussed conservative measures and other invasive and surgical measures.  But this does not explain all the numbness and tingling in her fingers, we will ask her to decrease the Topiramate and see if any of the sensory symptoms are medication related and in addition have her wear an left elbow brace. If elbow brace does not improve symptoms will send to orthopaedics.  We discussed in detail and answered all questions.  I spent 25 minutes of face-to-face and non-face-to-face time with patient on the  1. Ulnar 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 of left upper extremity   2. Numbness and tingling in both hands    diagnosis.  This included previsit chart review, lab review, study review, order entry, electronic health record documentation, patient education on the different diagnostic and therapeutic options, counseling and coordination of care, risks and benefits of management, compliance, or risk factor reduction. This does not include time spent on emg/ncs.

## 2019-08-09 NOTE — Procedures (Signed)
Full Name: Autumn Valenzuela Gender: Female MRN #: [id number] Date of Birth: 31-Aug-1960    Visit Date: 08/09/2019 15:45 Age: 59 Years Examining Physician: Sarina Ill, MD  Referring Physician: Sarina Ill, MD  History: Numbness and tingling in the fingers, left > right. Left arm hurts at the elbow with radiation down to the fingers.     Summary: The left Ulnar motor nerve showed a 32m/s drop(N<10) in conduction velocity across the left elbow. All remaining nerves (as indicated in the following tables) were within normal limits.  All muscles (as indicated in the following tables) were within normal limits.       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  No suggestion of polyneuropathy or cervical radiculopathy.   Sarina Ill M.D.  [facility] [place], [place] [address] Tel: [phone] Fax: [phone]  Verbal informed consent was obtained from the patient, patient was informed of potential risk of procedure, including bruising, bleeding, hematoma formation, infection, muscle weakness, muscle pain, numbness, among others.         [name]    Nerve / Sites Muscle Latency Ref. Amplitude Ref. Rel Amp Segments Distance Velocity Ref. Area    ms ms mV mV %  cm m/s m/s mVms  R Median - APB     Wrist APB 3.6 ?4.4 6.3 ?4.0 100 Wrist - APB 7   17.0     Upper arm APB 7.8  5.2  82.2 Upper arm - Wrist 21 50 ?49 15.5  L Median - APB     Wrist APB 3.9 ?4.4 7.9 ?4.0 100 Wrist - APB 7   21.0     Upper arm APB 8.0  7.7  98.5 Upper arm - Wrist 20 49 ?49 21.5  R Ulnar - ADM     Wrist ADM 2.7 ?3.3 9.2 ?6.0 100 Wrist - ADM 7   27.9     B.Elbow ADM 6.6  7.8  84.7 B.Elbow - Wrist 19 49 ?49 27.2     A.Elbow ADM 8.6  6.6  84.5 A.Elbow - B.Elbow 10 49 ?49 22.7         A.Elbow - Wrist      L Ulnar - ADM     Wrist ADM 3.0 ?3.3 8.9 ?6.0 100 Wrist - ADM 7   28.7     B.Elbow ADM 6.9  7.8  87.3 B.Elbow  - Wrist 19 49 ?49 27.7     A.Elbow ADM 9.6  6.8  87.1 A.Elbow - B.Elbow 10 36 ?49 27.1         A.Elbow - Wrist                 SSR      SNC    Nerve / Sites Rec. Site Peak Lat Ref.  Amp Ref. Segments Distance Peak Diff Ref.    ms ms V V  cm ms ms  R Median, Ulnar - Transcarpal comparison     Median Palm Wrist 2.1 ?2.2 101 ?35 Median Palm - Wrist 8       Ulnar Palm Wrist 1.9 ?2.2 13 ?12 Ulnar Palm - Wrist 8          Median Palm - Ulnar Palm  0.2 ?0.4  L Median, Ulnar - Transcarpal comparison     Median Palm Wrist 2.3 ?2.2 108 ?35 Median Palm - Wrist 8       Ulnar  Palm Wrist 1.9 ?2.2 19 ?12 Ulnar Palm - Wrist 8          Median Palm - Ulnar Palm  0.4 ?0.4  R Median - Orthodromic (Dig II, Mid palm)     Dig II Wrist 3.2 ?3.4 17 ?10 Dig II - Wrist 13    L Median - Orthodromic (Dig II, Mid palm)     Dig II Wrist 3.4 ?3.4 21 ?10 Dig II - Wrist 13    R Ulnar - Orthodromic, (Dig V, Mid palm)     Dig V Wrist 2.5 ?3.1 8 ?5 Dig V - Wrist 11    L Ulnar - Orthodromic, (Dig V, Mid palm)     Dig V Wrist 2.8 ?3.1 9 ?5 Dig V - Wrist 45                   F  Wave    Nerve F Lat Ref.   ms ms  R Ulnar - ADM 31.2 ?32.0  L Ulnar - ADM 31.9 ?32.0         EMG Summary Table    Spontaneous MUAP Recruitment  Muscle IA Fib PSW Fasc Other Amp Dur. Poly Pattern  L. Deltoid Normal None None None _______ Normal Normal Normal Normal  L. Triceps brachii Normal None None None _______ Normal Normal Normal Normal  L. Flexor digitorum profundus (Ulnar) Normal None None None _______ Normal Normal Normal Normal  L. Pronator teres Normal None None None _______ Normal Normal Normal Normal  L. First dorsal interosseous Normal None None None _______ Normal Normal Normal Normal  L. Opponens pollicis Normal None None None _______ Normal Normal Normal Normal  L. Cervical paraspinals (low) Normal None None None _______ Normal Normal Normal Normal

## 2019-08-09 NOTE — Patient Instructions (Addendum)
Decrease Topamax: 100mg (one pill) daily  and if feeling better then decrease to 1/2 tab daily Wear an elbow brace left elbow  Ulnar Neuropathy (left)   Cubital tunnel syndrome is a condition that causes pain and weakness of the forearm and hand. It happens when one of the nerves that runs along the inside of the elbow joint (ulnar nerve) becomes irritated. This condition is usually caused by repeated arm motions that are done during sports or work-related activities. What are the causes? This condition may be caused by:  Increased pressure on the ulnar nerve at the elbow, arm, or forearm. This can result from: ? Irritation caused by repeated elbow bending. ? Poorly healed elbow fractures. ? Tumors in the elbow. These are usually noncancerous (benign). ? Scar tissue that develops in the elbow after an injury. ? Bony growths (spurs) near the ulnar nerve.  Stretching of the nerve due to loose elbow ligaments.  Trauma to the nerve at the elbow. What increases the risk? The following factors may make you more likely to develop this condition:  Doing manual labor that requires frequent bending of the elbow.  Playing sports that include repeated or strenuous throwing motions, such as baseball.  Playing contact sports, such as football or lacrosse.  Not warming up properly before activities.  Having diabetes.  Having an underactive thyroid (hypothyroidism). What are the signs or symptoms? Symptoms of this condition include:  Clumsiness or weakness of the hand.  Tenderness of the inner elbow.  Aching or soreness of the inner elbow, forearm, or fingers, especially the little finger or the ring finger.  Increased pain when forcing the elbow to bend.  Reduced control when throwing objects.  Tingling, numbness, or a burning feeling inside the forearm or in part of the hand or fingers, especially the little finger or the ring finger.  Sharp pains that shoot from the elbow down to  the wrist and hand.  The inability to grip or pinch hard. How is this diagnosed? This condition is diagnosed based on:  Your symptoms and medical history. Your health care provider will also ask for details about any injury.  A physical exam. You may also have tests, including:  Electromyogram (EMG). This test measures electrical signals sent by your nerves into the muscles.  Nerve conduction study. This test measures how well electrical signals pass through your nerves.  Imaging tests, such as X-rays, ultrasound, and MRI. These tests check for possible causes of your condition. How is this treated? This condition may be treated by:  Stopping the activities that are causing your symptoms to get worse.  Icing and taking medicines to reduce pain and swelling.  Wearing a splint to prevent your elbow from bending, or wearing an elbow pad where the ulnar nerve is closest to the skin.  Working with a physical therapist in less severe cases. This may help to: ? Decrease your symptoms. ? Improve the strength and range of motion of your elbow, forearm, and hand. If these treatments do not help, surgery may be needed. Follow these instructions at home: If you have a splint:  Wear the splint as told by your health care provider. Remove it only as told by your health care provider.  Loosen the splint if your fingers tingle, become numb, or turn cold and blue.  Keep the splint clean.  If the splint is not waterproof: ? Do not let it get wet. ? Cover it with a watertight covering when you take a bath  or shower. Managing pain, stiffness, and swelling   If directed, put ice on the injured area: ? Put ice in a plastic bag. ? Place a towel between your skin and the bag. ? Leave the ice on for 20 minutes, 2-3 times a day.  Move your fingers often to avoid stiffness and to lessen swelling.  Raise (elevate) the injured area above the level of your heart while you are sitting or lying  down. General instructions  Take over-the-counter and prescription medicines only as told by your health care provider.  Do any exercise or physical therapy as told by your health care provider.  Do not drive or use heavy machinery while taking prescription pain medicine.  If you were given an elbow pad, wear it as told by your health care provider.  Keep all follow-up visits as told by your health care provider. This is important. Contact a health care provider if:  Your symptoms get worse.  Your symptoms do not get better with treatment.  You have new pain.  Your hand on the injured side feels numb or cold. Summary  Cubital tunnel syndrome is a condition that causes pain and weakness of the forearm and hand.  You are more likely to develop this condition if you do work or play sports that involve repeated arm movements.  This condition is often treated by stopping repetitive activities, applying ice, and using anti-inflammatory medicines.  In rare cases, surgery may be needed. This information is not intended to replace advice given to you by your health care provider. Make sure you discuss any questions you have with your health care provider. Document Revised: 05/19/2017 Document Reviewed: 05/19/2017 Elsevier Patient Education  [place].

## 2019-08-12 ENCOUNTER — Encounter: Payer: Self-pay | Admitting: Family Medicine

## 2019-08-16 ENCOUNTER — Ambulatory Visit (INDEPENDENT_AMBULATORY_CARE_PROVIDER_SITE_OTHER): Payer: Medicare HMO | Admitting: Internal Medicine

## 2019-08-16 ENCOUNTER — Encounter: Payer: Self-pay | Admitting: Internal Medicine

## 2019-08-16 ENCOUNTER — Other Ambulatory Visit: Payer: Self-pay

## 2019-08-16 VITALS — BP 116/80 | HR 84 | Ht 61.0 in | Wt 195.4 lb

## 2019-08-16 DIAGNOSIS — E1142 Type 2 diabetes mellitus with diabetic polyneuropathy: Secondary | ICD-10-CM

## 2019-08-16 DIAGNOSIS — E1165 Type 2 diabetes mellitus with hyperglycemia: Secondary | ICD-10-CM | POA: Diagnosis not present

## 2019-08-16 DIAGNOSIS — Z794 Long term (current) use of insulin: Secondary | ICD-10-CM | POA: Diagnosis not present

## 2019-08-16 LAB — POCT GLYCOSYLATED HEMOGLOBIN (HGB A1C): Hemoglobin A1C: 8.1 % — AB (ref 4.0–5.6)

## 2019-08-16 NOTE — Patient Instructions (Addendum)
-   CONGRATULATIONS! You have done an amazing work !! - Continue Metformin 500 mg 1 tablet with breakfast - Continue  lantus 26 units daily         HOW TO TREAT LOW BLOOD SUGARS (Blood sugar LESS THAN 70 MG/DL)  Please follow the RULE OF 15 for the treatment of hypoglycemia treatment (when your (blood sugars are less than 70 mg/dL)    STEP 1: Take 15 grams of carbohydrates when your blood sugar is low, which includes:   3-4 GLUCOSE TABS  OR  3-4 OZ OF JUICE OR REGULAR SODA OR  ONE TUBE OF GLUCOSE GEL     STEP 2: RECHECK blood sugar in 15 MINUTES STEP 3: If your blood sugar is still low at the 15 minute recheck --> then, go back to STEP 1 and treat AGAIN with another 15 grams of carbohydrates.

## 2019-08-16 NOTE — Progress Notes (Signed)
Name: Autumn Valenzuela  Age/ Sex: 59 y.o., female   MRN/ DOB: [id number], September 26, 1960     PCP: Elby Beck, FNP   Reason for Endocrinology Evaluation: Type 2 Diabetes Mellitus  Initial Endocrine Consultative Visit: 03/08/2019    PATIENT IDENTIFIER: Autumn Valenzuela is a 59 y.o. female with a past medical history of .T2Dm, bipolar disorder,migrane headaches  and dyslipidemia  The patient has followed with Endocrinology clinic since 03/08/2019 for consultative assistance with management of her diabetes.  DIABETIC HISTORY:  Autumn Valenzuela was diagnosed with T2DM in 2018.  She has been tried on oral glycemic agents such as glipizide and Metformin, she is intolerant to higher doses of Metformin.  She has been on insulin since 2018.  Her hemoglobin A1c has ranged from  6.7% in 2018, peaking at 14.6% in 2021   On her initial visit to our clinic she had an A1c of 14.6%, she was on metformin and Lantus.  SUBJECTIVE:   During the last visit (05/17/2019): A1c of 14.4 %.  We continued metformin and increased Lantus     Today (08/16/2019): Autumn Valenzuela is here for follow-up on diabetes management. She is accompanied by her daughter who has been a great support and motivator to the pt .She has been checking glucose at much better rate than in the past     HOME DIABETES REGIMEN:  Metformin 500 mg daily Lantus 26 units daily   Statin: Yes ACE-I/ARB: No     METER DOWNLOAD SUMMARY: 7/20-08/16/2019 Fingerstick Blood Glucose Tests = 5 Average Number Tests/Day = 0.4 Overall Mean FS Glucose = 146   BG Ranges: Low = 112 High = 188   Hypoglycemic Events/30 Days: BG < 50 = 0 Episodes of symptomatic severe hypoglycemia = 0       DIABETIC COMPLICATIONS: Microvascular complications:   Neuropathy  Denies: CKD, retinopathy   Last eye exam: Completed 2020  Macrovascular complications:   Denies: CAD, PVD, CVA    HISTORY:  Past Medical History:  Past Medical History:   Diagnosis Date  . Allergy   . Anemia   . Anxiety   . Arthritis   . Back pain   . Bipolar disorder ([place])   . CVA (cerebral infarction)   . Depression   . Diabetes mellitus without complication ([place])   . High cholesterol   . Hypertension   . Left leg pain   . Migraine   . Sleep apnea   . Stroke [facility])    Past Surgical History:  Past Surgical History:  Procedure Laterality Date  . ABDOMINAL HYSTERECTOMY    . ANTERIOR LATERAL LUMBAR FUSION WITH PERCUTANEOUS SCREW 1 LEVEL Left 02/11/2018   Procedure: LEFT LATERAL LUMBAR  FOUR-FIVE INTERBODY FUSION WITH INSTRUMENTATION AND ALLOGRAFT;  Surgeon: Phylliss Bob, MD;  Location: [facility];  Service: Orthopedics;  Laterality: Left;  LEFT LATERAL LUMBAR  FOUR-FIVE INTERBODY FUSION WITH INSTRUMENTATION AND ALLOGRAFT  . BACK SURGERY     Lumbar fusion L5-S1  . DIABETES    . EYE SURGERY Bilateral   . KNEE SURGERY    . l 4-l5 status post lateral posterior fusion January 30,2020    . NEUROFORAMINAL STENOSIS Left    Severe L4-L5, involving the level above previous fusion.  Marland Kitchen PARTIAL HYSTERECTOMY    . RADICULOPATHY Left    L4, Secondary to an L4-L5 Spondylolisthesis  . SHOULDER SURGERY      Social History:  reports that she quit smoking about 3 years ago. She has  never used smokeless tobacco. She reports current alcohol use. She reports previous drug use. Drug: Marijuana. Family History:  Family History  Problem Relation Age of Onset  . Diabetes Mother   . Stroke Father   . Cancer Paternal Aunt   . Cancer Paternal Aunt   . Diabetes Brother   . Colon cancer Neg Hx   . Esophageal cancer Neg Hx   . Rectal cancer Neg Hx   . Stomach cancer Neg Hx      HOME MEDICATIONS: Allergies as of 08/16/2019      Reactions   Cefazolin Swelling, Other (See Comments)   Rapid iv infusion caused swelling and burning at the iv site      Medication List       Accurate as of August 16, 2019  3:59 PM. If you have any questions, ask your nurse or doctor.         Accu-Chek FastClix Lancets Misc TEST 4 TIMES DAILY. DX E11.9   Accu-Chek Guide Control Liqd 1 each by In Vitro route every 30 (thirty) days. Needs Accu-check guide monitor system. DX. E11.9 What changed: additional instructions   Accu-Chek Guide test strip Generic drug: glucose blood Use to test blood sugar 4 times a day.  Dx: E11.9   acetaminophen 325 MG tablet Commonly known as: Tylenol Take 2 tablets (650 mg total) by mouth every 6 (six) hours as needed.   albuterol 108 (90 Base) MCG/ACT inhaler Commonly known as: VENTOLIN HFA Inhale 2 puffs into the lungs daily as needed for wheezing or shortness of breath.   albuterol (2.5 MG/3ML) 0.083% nebulizer solution Commonly known as: PROVENTIL Take 2.5 mg by nebulization at bedtime as needed for wheezing.   ALPRAZolam 0.5 MG tablet Commonly known as: XANAX Take 1 tablet (0.5 mg total) by mouth daily as needed for anxiety.   amitriptyline 25 MG tablet Commonly known as: ELAVIL TAKE 1 TABLET(25 MG) BY MOUTH AT BEDTIME   atorvastatin 20 MG tablet Commonly known as: LIPITOR Take 1 tablet (20 mg total) by mouth daily.   Botox 100 units Solr injection Generic drug: botulinum toxin Type A PROVIDER TO INJECT 155 UNITS INTO THE MUSCLES OF THE HEAD AND NECK EVERY 3 MONTHS. DISCARD REMAINDER.   calcium-vitamin D 500-200 MG-UNIT tablet Commonly known as: OSCAL WITH D Take 1 tablet by mouth daily with breakfast.   cyclobenzaprine 10 MG tablet Commonly known as: FLEXERIL Take 10 mg by mouth every other day.   diclofenac Sodium 1 % Gel Commonly known as: Voltaren Apply 4 g topically 2 (two) times daily as needed.   DULoxetine 60 MG capsule Commonly known as: CYMBALTA Take 1 capsule (60 mg total) by mouth at bedtime.   ferrous sulfate 325 (65 FE) MG tablet Take 1 tablet (325 mg total) by mouth every other day.   fluticasone 50 MCG/ACT nasal spray Commonly known as: FLONASE Place 2 sprays into both nostrils daily. What  changed:   when to take this  reasons to take this   gabapentin 300 MG capsule Commonly known as: NEURONTIN 1 capsule twice dailty   insulin glargine 100 UNIT/ML Solostar Pen Commonly known as: LANTUS Inject 26 Units into the skin daily.   Insulin Pen Needle 31G X 5 MM Misc 1 Units by Does not apply route at bedtime.   ketorolac 0.4 % Soln Commonly known as: ACULAR Place 1 drop into both eyes daily as needed (eye irritation).   levocetirizine 5 MG tablet Commonly known as: XYZAL Take 1  tablet (5 mg total) by mouth every evening.   loratadine 10 MG tablet Commonly known as: CLARITIN Take 1 tablet (10 mg total) by mouth daily. What changed:   when to take this  reasons to take this   meloxicam 15 MG tablet Commonly known as: MOBIC One tablet every 24 hours as needed for inflammation.   metFORMIN 500 MG 24 hr tablet Commonly known as: GLUCOPHAGE-XR Take 1 tablet (500 mg total) by mouth 2 (two) times daily with a meal.   naproxen 500 MG tablet Commonly known as: NAPROSYN Take 1 tablet (500 mg total) by mouth 2 (two) times daily.   tiZANidine 4 MG tablet Commonly known as: ZANAFLEX START 1 TABLET BY MOUTH AT NIGHT FOR 1 WEEK THEN 2 TIMES DAILY FOR 1 WEEK AND THEN TAKE 1 TABLET 3 TIMES DAILY THEREAFTER   topiramate 100 MG tablet Commonly known as: TOPAMAX Take 1 tablet (100 mg total) by mouth 2 (two) times daily.   traMADol 50 MG tablet Commonly known as: ULTRAM TAKE 1 TABLET BY MOUTH EVERY 8 HOURS AS NEEDED   triamcinolone ointment 0.1 % Commonly known as: KENALOG Apply topically 2 (two) times daily. For no more than 10 days.   Vitamin D (Ergocalciferol) 1.25 MG (50000 UNIT) Caps capsule Commonly known as: DRISDOL Take 1 capsule (50,000 Units total) by mouth every 7 (seven) days.   Vitamin D3 1.25 MG (50000 UT) Tabs Take 1 tablet by mouth every 7 (seven) days.        OBJECTIVE:   Vital Signs: BP 116/80 (BP Location: Left Arm, Patient Position:  Sitting, Cuff Size: Normal)   Pulse 84   Ht 5\' 1"  (1.549 m)   Wt 195 lb 6.4 oz (88.6 kg)   SpO2 98%   BMI 36.92 kg/m   Wt Readings from Last 3 Encounters:  08/16/19 195 lb 6.4 oz (88.6 kg)  08/04/19 197 lb (89.4 kg)  06/14/19 200 lb (90.7 kg)     Exam: General: Pt appears well and is in NAD  Lungs: Clear with good BS bilat with no rales, rhonchi, or wheezes  Heart: RRR with normal S1 and S2 and no gallops; no murmurs; no rub  Extremities: No pretibial edema.   Neuro: MS is good with appropriate affect, pt is alert and Ox3      DM Foot Exam 08/16/2019  The skin of the feet is intact without sores or ulcerations. The pedal pulses are 2+ on right and 2+ on left. The sensation is intact to a screening 5.07, 10 gram monofilament bilaterally    DATA REVIEWED:  Lab Results  Component Value Date   HGBA1C 8.1 (A) 08/16/2019   HGBA1C 14.4 (A) 05/03/2019   HGBA1C 14.6 (H) 02/04/2019   Lab Results  Component Value Date   MICROALBUR 1.3 10/07/2018   LDLCALC 133 (H) 10/07/2018   CREATININE 0.62 10/07/2018   Lab Results  Component Value Date   MICRALBCREAT 0.6 10/07/2018     Lab Results  Component Value Date   CHOL 202 (H) 10/07/2018   HDL 43.80 10/07/2018   LDLCALC 133 (H) 10/07/2018   TRIG 128.0 10/07/2018   CHOLHDL 5 10/07/2018        ASSESSMENT / PLAN / RECOMMENDATIONS:   1) Type 2 Diabetes Mellitus, with Improving glucose control, With neuropathic complications - Most recent A1c of 8.1 %. Goal A1c <7.0%.     - A1c down from 14.4%  - I have congratulated her on the great improvement in glucose  control and encouraged her to continue with lifestyle changes and compliance.   - They continue to decline add-on therapy at this time, they would like to continue current regimen - They are interested in a CGM, a prescription was sent     MEDICATIONS: Continue Metformin 500 mg 1 tablet with breakfast Continue  lantus  26 units daily    EDUCATION /  INSTRUCTIONS:  BG monitoring instructions: Patient is instructed to check her blood sugars 2 times a day, fasting and bedtime as much as possible  Call [facility] if: BG persistently  > 300. . I reviewed the Rule of 15 for the treatment of hypoglycemia in detail with the patient. Literature supplied.    2) Diabetic complications:   Eye: Does not have known diabetic retinopathy.   Neuro/ Feet: Does have known diabetic peripheral neuropathy.  Renal: Patient does not have known baseline CKD. She is not an ACEI/ARB at present      F/U in 4 months    Signed electronically by: Mack Guise, MD  [facility]  [address] [place], [place] [address] Phone: [phone] FAX: [phone]   CC: Elby Beck, [place] [address] Phone: [phone]  Fax: [phone]  Return to Endocrinology clinic as below: Future Appointments  Date Time Provider [place]  08/20/2019  9:45 AM Gardiner Barefoot, DPM [facility]  09/01/2019  1:00 PM Lomax, Amy, NP [facility] None

## 2019-08-17 MED ORDER — FREESTYLE LIBRE 2 SENSOR MISC: 1.0000 | 11 refills | Status: DC

## 2019-08-17 MED ORDER — FREESTYLE LIBRE 2 READER DEVI: 1.0000 | 0 refills | Status: DC

## 2019-08-19 ENCOUNTER — Telehealth: Payer: Self-pay | Admitting: Family Medicine

## 2019-08-19 ENCOUNTER — Other Ambulatory Visit: Payer: Self-pay | Admitting: Family Medicine

## 2019-08-19 DIAGNOSIS — F3170 Bipolar disorder, currently in remission, most recent episode unspecified: Secondary | ICD-10-CM

## 2019-08-19 DIAGNOSIS — G8929 Other chronic pain: Secondary | ICD-10-CM | POA: Diagnosis not present

## 2019-08-19 DIAGNOSIS — F4323 Adjustment disorder with mixed anxiety and depressed mood: Secondary | ICD-10-CM | POA: Diagnosis not present

## 2019-08-19 DIAGNOSIS — E1165 Type 2 diabetes mellitus with hyperglycemia: Secondary | ICD-10-CM | POA: Diagnosis not present

## 2019-08-19 DIAGNOSIS — E1142 Type 2 diabetes mellitus with diabetic polyneuropathy: Secondary | ICD-10-CM | POA: Diagnosis not present

## 2019-08-19 DIAGNOSIS — J45909 Unspecified asthma, uncomplicated: Secondary | ICD-10-CM | POA: Diagnosis not present

## 2019-08-19 DIAGNOSIS — D649 Anemia, unspecified: Secondary | ICD-10-CM | POA: Diagnosis not present

## 2019-08-19 DIAGNOSIS — G43909 Migraine, unspecified, not intractable, without status migrainosus: Secondary | ICD-10-CM | POA: Diagnosis not present

## 2019-08-19 DIAGNOSIS — R69 Illness, unspecified: Secondary | ICD-10-CM | POA: Diagnosis not present

## 2019-08-19 DIAGNOSIS — Z794 Long term (current) use of insulin: Secondary | ICD-10-CM | POA: Diagnosis not present

## 2019-08-19 DIAGNOSIS — F419 Anxiety disorder, unspecified: Secondary | ICD-10-CM

## 2019-08-19 MED ORDER — DULOXETINE HCL 60 MG PO CPEP: 60.0000 mg | ORAL_CAPSULE | Freq: Every day | ORAL | 1 refills | Status: DC

## 2019-08-19 MED ORDER — ALPRAZOLAM 0.5 MG PO TABS: 0.5000 mg | ORAL_TABLET | Freq: Every day | ORAL | 0 refills | Status: DC | PRN

## 2019-08-19 NOTE — Telephone Encounter (Signed)
Please call patient and let her know that I have sent in her medications. She is due follow up with me. Please schedule her within 4 weeks.

## 2019-08-19 NOTE — Telephone Encounter (Signed)
Patient's POA notified as instructed by telephone.  They will call back to schedule office visit.

## 2019-08-19 NOTE — Telephone Encounter (Signed)
Patient called stating her pharamacy at [facility] on [facility]. Doesn't have her Xanax and Duloxetine medication. Please call patient at [phone] when medication is sent over.

## 2019-08-20 ENCOUNTER — Encounter: Payer: Self-pay | Admitting: Podiatry

## 2019-08-20 ENCOUNTER — Other Ambulatory Visit: Payer: Self-pay

## 2019-08-20 ENCOUNTER — Ambulatory Visit (INDEPENDENT_AMBULATORY_CARE_PROVIDER_SITE_OTHER): Payer: Medicare HMO | Admitting: Podiatry

## 2019-08-20 DIAGNOSIS — M79675 Pain in left toe(s): Secondary | ICD-10-CM

## 2019-08-20 DIAGNOSIS — B351 Tinea unguium: Secondary | ICD-10-CM

## 2019-08-20 DIAGNOSIS — M79674 Pain in right toe(s): Secondary | ICD-10-CM | POA: Diagnosis not present

## 2019-08-20 DIAGNOSIS — E1140 Type 2 diabetes mellitus with diabetic neuropathy, unspecified: Secondary | ICD-10-CM | POA: Diagnosis not present

## 2019-08-20 DIAGNOSIS — L608 Other nail disorders: Secondary | ICD-10-CM

## 2019-08-20 DIAGNOSIS — E1149 Type 2 diabetes mellitus with other diabetic neurological complication: Secondary | ICD-10-CM

## 2019-08-20 NOTE — Progress Notes (Signed)
This patient returns to my office for at risk foot care.  This patient requires this care by a professional since this patient will be at risk due to having  Diabetes.  This patient is unable to cut nails herself since the patient cannot reach her nails.These nails are painful walking and wearing shoes.  This patient presents for at risk foot care today.  General Appearance  Alert, conversant and in no acute stress.  Vascular  Dorsalis pedis and posterior tibial  pulses are palpable  bilaterally.  Capillary return is within normal limits  bilaterally. Temperature is within normal limits  bilaterally.  Neurologic  Senn-Weinstein monofilament wire test within normal limits  bilaterally. Muscle power within normal limits bilaterally.  Nails Thick disfigured discolored nails with subungual debris  from hallux to fifth toes bilaterally.  Pincer nails left foot.   No evidence of bacterial infection or drainage bilaterally.  Orthopedic  No limitations of motion  feet .  No crepitus or effusions noted.  No bony pathology or digital deformities noted.  Skin  normotropic skin with no porokeratosis noted bilaterally.  No signs of infections or ulcers noted.     Onychomycosis  Pain in right toes  Pain in left toes  Consent was obtained for treatment procedures.   Mechanical debridement of nails 1-5  bilaterally performed with a nail nipper.  Filed with dremel without incident.    Return office visit  3 months                    Told patient to return for periodic foot care and evaluation due to potential at risk complications.   Gardiner Barefoot DPM

## 2019-08-30 ENCOUNTER — Telehealth: Payer: Self-pay | Admitting: Family Medicine

## 2019-08-30 NOTE — Telephone Encounter (Signed)
Patent has an appointment on 8/18 for Botox. I called [employer] [phone]) and spoke with Otila Kluver to see if M1470 and [phone] require PA. Otila Kluver states [phone] does not require PA but J0585 does. She provided me with the number to the specialty pharmacy department [phone]) to get PA on J0585. Reference #[id number]. I spoke with Delilah who was able to get J0585 approved. (08/30/19- 08/29/20) PA #[id number]. Patient is B/B because she is Medicare.

## 2019-09-01 ENCOUNTER — Ambulatory Visit: Payer: Medicare HMO | Admitting: Family Medicine

## 2019-09-01 NOTE — Progress Notes (Deleted)
 Consent Form Botulism Toxin Injection For Chronic Migraine    Reviewed orally with patient, additionally signature is on file:  Botulism toxin has been approved by the Federal drug administration for treatment of chronic migraine. Botulism toxin does not cure chronic migraine and it may not be effective in some patients.  The administration of botulism toxin is accomplished by injecting a small amount of toxin into the muscles of the neck and head. Dosage must be titrated for each individual. Any benefits resulting from botulism toxin tend to wear off after 3 months with a repeat injection required if benefit is to be maintained. Injections are usually done every 3-4 months with maximum effect peak achieved by about 2 or 3 weeks. Botulism toxin is expensive and you should be sure of what costs you will incur resulting from the injection.  The side effects of botulism toxin use for chronic migraine may include:   -Transient, and usually mild, facial weakness with facial injections  -Transient, and usually mild, head or neck weakness with head/neck injections  -Reduction or loss of forehead facial animation due to forehead muscle weakness  -Eyelid drooping  -Dry eye  -Pain at the site of injection or bruising at the site of injection  -Double vision  -Potential unknown long term risks   Contraindications: You should not have Botox if you are pregnant, nursing, allergic to albumin, have an infection, skin condition, or muscle weakness at the site of the injection, or have myasthenia gravis, Lambert-Eaton syndrome, or ALS.  It is also possible that as with any injection, there may be an allergic reaction or no effect from the medication. Reduced effectiveness after repeated injections is sometimes seen and rarely infection at the injection site may occur. All care will be taken to prevent these side effects. If therapy is given over a long time, atrophy and wasting in the muscle injected may  occur. Occasionally the patient's become refractory to treatment because they develop antibodies to the toxin. In this event, therapy needs to be modified.  I have read the above information and consent to the administration of botulism toxin.    BOTOX PROCEDURE NOTE FOR MIGRAINE HEADACHE  Contraindications and precautions discussed with patient(above). Aseptic procedure was observed and patient tolerated procedure. Procedure performed by Walker Paddack, FNP-C.   The condition has existed for more than 6 months, and pt does not have a diagnosis of ALS, Myasthenia Gravis or Lambert-Eaton Syndrome.  Risks and benefits of injections discussed and pt agrees to proceed with the procedure.  Written consent obtained  These injections are medically necessary. Pt  receives good benefits from these injections. These injections do not cause sedations or hallucinations which the oral therapies may cause.   Description of procedure:  The patient was placed in a sitting position. The standard protocol was used for Botox as follows, with 5 units of Botox injected at each site:  -Procerus muscle, midline injection  -Corrugator muscle, bilateral injection  -Frontalis muscle, bilateral injection, with 2 sites each side, medial injection was performed in the upper one third of the frontalis muscle, in the region vertical from the medial inferior edge of the superior orbital rim. The lateral injection was again in the upper one third of the forehead vertically above the lateral limbus of the cornea, 1.5 cm lateral to the medial injection site.  -Temporalis muscle injection, 4 sites, bilaterally. The first injection was 3 cm above the tragus of the ear, second injection site was 1.5 cm to   3 cm up from the first injection site in line with the tragus of the ear. The third injection site was 1.5-3 cm forward between the first 2 injection sites. The fourth injection site was 1.5 cm posterior to the second injection  site. 5th site laterally in the temporalis  muscleat the level of the outer canthus.  -Occipitalis muscle injection, 3 sites, bilaterally. The first injection was done one half way between the occipital protuberance and the tip of the mastoid process behind the ear. The second injection site was done lateral and superior to the first, 1 fingerbreadth from the first injection. The third injection site was 1 fingerbreadth superiorly and medially from the first injection site.  -Cervical paraspinal muscle injection, 2 sites, bilaterally. The first injection site was 1 cm from the midline of the cervical spine, 3 cm inferior to the lower border of the occipital protuberance. The second injection site was 1.5 cm superiorly and laterally to the first injection site.  -Trapezius muscle injection was performed at 3 sites, bilaterally. The first injection site was in the upper trapezius muscle halfway between the inflection point of the neck, and the acromion. The second injection site was one half way between the acromion and the first injection site. The third injection was done between the first injection site and the inflection point of the neck.   Will return for repeat injection in 3 months.   A total of 200 units of Botox was prepared, 155 units of Botox was injected as documented above, any Botox not injected was wasted. The patient tolerated the procedure well, there were no complications of the above procedure.   

## 2019-09-02 ENCOUNTER — Ambulatory Visit: Payer: Medicare HMO | Admitting: Family Medicine

## 2019-09-09 ENCOUNTER — Other Ambulatory Visit: Payer: Self-pay | Admitting: Family Medicine

## 2019-09-09 DIAGNOSIS — G8929 Other chronic pain: Secondary | ICD-10-CM

## 2019-09-09 NOTE — Telephone Encounter (Signed)
Last office visit 02/26/2019 for right arm pain. Last refill 08/06/2019 #20. No future appts with PCP.

## 2019-09-15 ENCOUNTER — Ambulatory Visit: Payer: Medicare HMO | Admitting: Family Medicine

## 2019-09-17 ENCOUNTER — Other Ambulatory Visit: Payer: Self-pay

## 2019-09-17 ENCOUNTER — Ambulatory Visit: Payer: Medicare HMO | Admitting: Family Medicine

## 2019-09-22 ENCOUNTER — Ambulatory Visit: Payer: Medicare HMO | Admitting: Family Medicine

## 2019-09-22 ENCOUNTER — Encounter: Payer: Self-pay | Admitting: Family Medicine

## 2019-09-22 ENCOUNTER — Ambulatory Visit (INDEPENDENT_AMBULATORY_CARE_PROVIDER_SITE_OTHER): Payer: Medicare HMO | Admitting: Family Medicine

## 2019-09-22 ENCOUNTER — Other Ambulatory Visit: Payer: Self-pay

## 2019-09-22 VITALS — BP 122/80 | HR 100 | Temp 97.9°F | Ht 61.0 in | Wt 203.8 lb

## 2019-09-22 DIAGNOSIS — R69 Illness, unspecified: Secondary | ICD-10-CM | POA: Diagnosis not present

## 2019-09-22 DIAGNOSIS — Z794 Long term (current) use of insulin: Secondary | ICD-10-CM

## 2019-09-22 DIAGNOSIS — M25511 Pain in right shoulder: Secondary | ICD-10-CM | POA: Diagnosis not present

## 2019-09-22 DIAGNOSIS — J309 Allergic rhinitis, unspecified: Secondary | ICD-10-CM | POA: Diagnosis not present

## 2019-09-22 DIAGNOSIS — F32A Depression, unspecified: Secondary | ICD-10-CM

## 2019-09-22 DIAGNOSIS — R232 Flushing: Secondary | ICD-10-CM | POA: Diagnosis not present

## 2019-09-22 DIAGNOSIS — G8929 Other chronic pain: Secondary | ICD-10-CM

## 2019-09-22 DIAGNOSIS — E1159 Type 2 diabetes mellitus with other circulatory complications: Secondary | ICD-10-CM

## 2019-09-22 DIAGNOSIS — E559 Vitamin D deficiency, unspecified: Secondary | ICD-10-CM

## 2019-09-22 DIAGNOSIS — F329 Major depressive disorder, single episode, unspecified: Secondary | ICD-10-CM

## 2019-09-22 LAB — LIPID PANEL
Cholesterol: 189 mg/dL (ref 0–200)
HDL: 44.2 mg/dL (ref >39.00)
LDL Cholesterol: 116 mg/dL — ABNORMAL HIGH (ref 0–99)
NonHDL: 145.11
Total CHOL/HDL Ratio: 4
Triglycerides: 147 mg/dL (ref 0.0–149.0)
VLDL: 29.4 mg/dL (ref 0.0–40.0)

## 2019-09-22 LAB — COMPREHENSIVE METABOLIC PANEL
ALT: 8 U/L (ref 0–35)
AST: 11 U/L (ref 0–37)
Albumin: 4 g/dL (ref 3.5–5.2)
Alkaline Phosphatase: 58 U/L (ref 39–117)
BUN: 14 mg/dL (ref 6–23)
CO2: 29 mEq/L (ref 19–32)
Calcium: 9.2 mg/dL (ref 8.4–10.5)
Chloride: 104 mEq/L (ref 96–112)
Creatinine, Ser: 0.78 mg/dL (ref 0.40–1.20)
GFR: 92.1 mL/min (ref >60.00)
Glucose, Bld: 168 mg/dL — ABNORMAL HIGH (ref 70–99)
Potassium: 4.4 mEq/L (ref 3.5–5.1)
Sodium: 139 mEq/L (ref 135–145)
Total Bilirubin: 0.3 mg/dL (ref 0.2–1.2)
Total Protein: 6.8 g/dL (ref 6.0–8.3)

## 2019-09-22 LAB — MICROALBUMIN / CREATININE URINE RATIO
Creatinine,U: 136 mg/dL
Microalb Creat Ratio: 0.5 mg/g (ref 0.0–30.0)
Microalb, Ur: <0.7 mg/dL (ref 0.0–1.9)

## 2019-09-22 LAB — VITAMIN D 25 HYDROXY (VIT D DEFICIENCY, FRACTURES): VITD: 47.98 ng/mL (ref 30.00–100.00)

## 2019-09-22 MED ORDER — TRAMADOL HCL 50 MG PO TABS: 50.0000 mg | ORAL_TABLET | Freq: Three times a day (TID) | ORAL | 0 refills | Status: DC | PRN

## 2019-09-22 MED ORDER — FLUTICASONE PROPIONATE 50 MCG/ACT NA SUSP: 2.0000 | Freq: Every day | NASAL | 5 refills | Status: DC | PRN

## 2019-09-22 MED ORDER — FLUTICASONE PROPIONATE 50 MCG/ACT NA SUSP: 2.0000 | Freq: Every day | NASAL | 5 refills | Status: AC | PRN

## 2019-09-22 NOTE — Progress Notes (Signed)
Subjective:    Patient ID: Autumn Valenzuela, female    DOB: 02-25-1960, 59 y.o.   MRN: [id number]  HPI Chief Complaint  Patient presents with  . Medication Refill    tramadol and flonase    This is a 59 yo female who presents today for follow up of chronic pain, requests refill of tramadol.   Continues to have pain in left arm, takes occasional tramadol. Sees neurology. Using 3x/ week. Night worse. Sleep is difficult. Has been wearing splint.   Fully vaccinated for Covid. Will provider her dates.   Sneezing and runny nose two days. No cough, fever, or SOB today. Used her albuterol inhaler yesterday. Taking some Tylenol. Grandchildren have had colds.   Hot flashes- every day, 2 or more a day. Does not bother her too much but people around her notice her sweating.  This has been ongoing symptom for several years.  She is currently on gabapentin and duloxetine.  She is unable to identify triggers.  Poor sleep which is chronic.  Sometimes disturbed by pain.  Sometimes just difficulty falling asleep.  Feels rested.  Increased stress.  She helps out with her 8 grandchildren.  She does find enjoyment in doing things around her house and yard.  Some increased financial stressors recently.  Not currently seeing a therapist or psychiatrist.  Review of Systems Per HPI    Objective:   Physical Exam Vitals reviewed.  Constitutional:      General: She is not in acute distress.    Appearance: Normal appearance. She is obese. She is not ill-appearing, toxic-appearing or diaphoretic.  HENT:     Head: Normocephalic and atraumatic.     Right Ear: Tympanic membrane, ear canal and external ear normal.     Left Ear: Tympanic membrane, ear canal and external ear normal.     Nose: Congestion and rhinorrhea present.     Mouth/Throat:     Mouth: Mucous membranes are moist.     Pharynx: Oropharynx is clear.  Eyes:     Conjunctiva/sclera: Conjunctivae normal.  Cardiovascular:     Rate and Rhythm:  Normal rate and regular rhythm.     Pulses: Normal pulses.     Heart sounds: Normal heart sounds.  Pulmonary:     Effort: Pulmonary effort is normal.     Breath sounds: Normal breath sounds.  Skin:    General: Skin is warm and dry.  Neurological:     Mental Status: She is alert and oriented to person, place, and time.  Psychiatric:        Behavior: Behavior normal.        Thought Content: Thought content normal.        Judgment: Judgment normal.     Comments: Tearful at times during office visit.       BP 122/80   Pulse 100   Temp 97.9 F (36.6 C) (Temporal)   Ht 5\' 1"  (1.549 m)   Wt 203 lb 12 oz (92.4 kg)   SpO2 98%   BMI 38.50 kg/m  Wt Readings from Last 3 Encounters:  09/22/19 203 lb 12 oz (92.4 kg)  08/16/19 195 lb 6.4 oz (88.6 kg)  08/04/19 197 lb (89.4 kg)   Depression screen [facility] 2/9 09/22/2019 02/01/2019 08/21/2018 08/25/2017 08/18/2017  Decreased Interest 1 0 0 0 0  Down, Depressed, Hopeless 1 0 1 1 0  PHQ - 2 Score 2 0 1 1 0  Altered sleeping 0 - - 2 0  Tired, decreased energy 1 - - 3 0  Change in appetite 2 - - 1 0  Feeling bad or failure about yourself  1 - - 3 0  Trouble concentrating 1 - - 1 0  Moving slowly or fidgety/restless 1 - - 1 0  Suicidal thoughts 2 - - 1 0  PHQ-9 Score 10 - - 13 0  Difficult doing work/chores Somewhat difficult - - - Not difficult at all  Some recent data might be hidden       Assessment & Plan:  1. Vitamin D deficiency - Vitamin D, 25-hydroxy  2. Type 2 diabetes mellitus with other circulatory complication, with long-term current use of insulin ([place]) - followed by endocrinology - Lipid Panel - Microalbumin / creatinine urine ratio - Comprehensive metabolic panel  3. Chronic right shoulder pain - continues to be followed by neurology - traMADol (ULTRAM) 50 MG tablet; Take 1 tablet (50 mg total) by mouth every 8 (eight) hours as needed.  Dispense: 20 tablet; Refill: 0  4. Allergic rhinitis, unspecified seasonality,  unspecified trigger - refill fluticasone nasal spray  5. Hot flashes - after reviewing her chart and treatment options, will recommend referral to gyn. She is not a good candidate for hormone therapy and is already on duloxetine and gabapentin  6. Depression, unspecified depression type - multiple life stressors, offered contact information to schedule counseling which she accepted  - follow up in 6 months  This visit occurred during the SARS-CoV-2 public health emergency.  Safety protocols were in place, including screening questions prior to the visit, additional usage of staff PPE, and extensive cleaning of exam room while observing appropriate contact time as indicated for disinfecting solutions.     Clarene Reamer, FNP-BC  [facility], [place], [facility] [place] [facility]  09/23/2019 8:55 PM

## 2019-09-22 NOTE — Patient Instructions (Addendum)
  Good to see you today  Follow up in 6 months

## 2019-09-27 ENCOUNTER — Other Ambulatory Visit: Payer: Self-pay | Admitting: Family Medicine

## 2019-09-27 DIAGNOSIS — R232 Flushing: Secondary | ICD-10-CM

## 2019-10-05 ENCOUNTER — Other Ambulatory Visit: Payer: Self-pay | Admitting: Family Medicine

## 2019-10-05 NOTE — Telephone Encounter (Signed)
Last office visit 09/22/2019 for med refill.  Last refilled??  Listed as a historical medication.  No future appointments.  Refill?

## 2019-10-06 ENCOUNTER — Telehealth: Payer: Self-pay | Admitting: Family Medicine

## 2019-10-06 NOTE — Telephone Encounter (Signed)
Noted  

## 2019-10-06 NOTE — Telephone Encounter (Signed)
Appointment not scheduled "10-01-19 Patient will call back to schedule appointment. " [facility] said on Oct 01, 2019 9:08 AM

## 2019-10-19 ENCOUNTER — Other Ambulatory Visit: Payer: Self-pay | Admitting: Family Medicine

## 2019-10-19 DIAGNOSIS — G8929 Other chronic pain: Secondary | ICD-10-CM

## 2019-10-20 NOTE — Telephone Encounter (Signed)
Last office visit 09/08/20201 for med refill.  Last refilled 09/22/2019 for #20 with no refills.  No future appointments.

## 2019-10-27 ENCOUNTER — Other Ambulatory Visit: Payer: Self-pay | Admitting: Family Medicine

## 2019-10-27 DIAGNOSIS — F419 Anxiety disorder, unspecified: Secondary | ICD-10-CM

## 2019-10-29 NOTE — Telephone Encounter (Signed)
No upcoming appt. Last office visit 09/22/2019.Last refill 08/19/2019

## 2019-11-18 ENCOUNTER — Other Ambulatory Visit: Payer: Self-pay | Admitting: Family Medicine

## 2019-11-18 DIAGNOSIS — G8929 Other chronic pain: Secondary | ICD-10-CM

## 2019-11-24 ENCOUNTER — Ambulatory Visit (INDEPENDENT_AMBULATORY_CARE_PROVIDER_SITE_OTHER): Payer: Medicare HMO | Admitting: Podiatry

## 2019-11-24 ENCOUNTER — Encounter: Payer: Self-pay | Admitting: Podiatry

## 2019-11-24 ENCOUNTER — Other Ambulatory Visit: Payer: Self-pay

## 2019-11-24 DIAGNOSIS — M79674 Pain in right toe(s): Secondary | ICD-10-CM | POA: Diagnosis not present

## 2019-11-24 DIAGNOSIS — L608 Other nail disorders: Secondary | ICD-10-CM

## 2019-11-24 DIAGNOSIS — B351 Tinea unguium: Secondary | ICD-10-CM

## 2019-11-24 DIAGNOSIS — E1140 Type 2 diabetes mellitus with diabetic neuropathy, unspecified: Secondary | ICD-10-CM | POA: Diagnosis not present

## 2019-11-24 DIAGNOSIS — E1149 Type 2 diabetes mellitus with other diabetic neurological complication: Secondary | ICD-10-CM

## 2019-11-24 DIAGNOSIS — M79675 Pain in left toe(s): Secondary | ICD-10-CM

## 2019-11-24 NOTE — Progress Notes (Signed)
This patient returns to my office for at risk foot care.  This patient requires this care by a professional since this patient will be at risk due to having  Diabetes.  This patient is unable to cut nails herself since the patient cannot reach her nails.These nails are painful walking and wearing shoes.  This patient presents for at risk foot care today.  General Appearance  Alert, conversant and in no acute stress.  Vascular  Dorsalis pedis and posterior tibial  pulses are palpable  bilaterally.  Capillary return is within normal limits  bilaterally. Temperature is within normal limits  bilaterally.  Neurologic  Senn-Weinstein monofilament wire test within normal limits  bilaterally. Muscle power within normal limits bilaterally.  Nails Thick disfigured discolored nails with subungual debris  from hallux to fifth toes bilaterally.  Pincer nails second toes  B/L.   No evidence of bacterial infection or drainage bilaterally.  Orthopedic  No limitations of motion  feet .  No crepitus or effusions noted.  No bony pathology or digital deformities noted.  Skin  normotropic skin with no porokeratosis noted bilaterally.  No signs of infections or ulcers noted.     Onychomycosis  Pain in right toes  Pain in left toes  Consent was obtained for treatment procedures.   Mechanical debridement of nails 1-5  bilaterally performed with a nail nipper.  Filed with dremel without incident.    Return office visit   10 weeks.                   Told patient to return for periodic foot care and evaluation due to potential at risk complications.   Gardiner Barefoot DPM

## 2019-12-06 ENCOUNTER — Ambulatory Visit: Payer: Medicare HMO | Admitting: Family Medicine

## 2019-12-22 ENCOUNTER — Other Ambulatory Visit: Payer: Self-pay | Admitting: Family Medicine

## 2019-12-22 DIAGNOSIS — F419 Anxiety disorder, unspecified: Secondary | ICD-10-CM

## 2019-12-22 DIAGNOSIS — G8929 Other chronic pain: Secondary | ICD-10-CM

## 2019-12-27 ENCOUNTER — Ambulatory Visit: Payer: Medicare HMO | Admitting: Internal Medicine

## 2019-12-27 NOTE — Progress Notes (Deleted)
Name: Autumn Valenzuela  Age/ Sex: 59 y.o., female   MRN/ DOB: [id number], February 28, 1960     PCP: Elby Beck, FNP   Reason for Endocrinology Evaluation: Type 2 Diabetes Mellitus  Initial Endocrine Consultative Visit: 03/08/2019    PATIENT IDENTIFIER: Autumn Valenzuela is a 59 y.o. female with a past medical history of .T2Dm, bipolar disorder,migrane headaches  and dyslipidemia  The patient has followed with Endocrinology clinic since 03/08/2019 for consultative assistance with management of her diabetes.  DIABETIC HISTORY:  Autumn Valenzuela was diagnosed with T2DM in 2018.  She has been tried on oral glycemic agents such as glipizide and Metformin, she is intolerant to higher doses of Metformin.  She has been on insulin since 2018.  Her hemoglobin A1c has ranged from  6.7% in 2018, peaking at 14.6% in 2021   On her initial visit to our clinic she had an A1c of 14.6%, she was on metformin and Lantus.  SUBJECTIVE:   During the last visit (08/16/2019): A1c of 8.1 %.  We continued metformin and increased Lantus     Today (12/27/2019): Autumn Valenzuela is here for follow-up on diabetes management. She is accompanied by her daughter who has been a great support and motivator to the pt .She has been checking glucose at much better rate than in the past     HOME DIABETES REGIMEN:  Metformin 500 mg daily Lantus 26 units daily   Statin: Yes ACE-I/ARB: No     METER DOWNLOAD SUMMARY: 7/20-08/16/2019 Fingerstick Blood Glucose Tests = 5 Average Number Tests/Day = 0.4 Overall Mean FS Glucose = 146   BG Ranges: Low = 112 High = 188   Hypoglycemic Events/30 Days: BG < 50 = 0 Episodes of symptomatic severe hypoglycemia = 0       DIABETIC COMPLICATIONS: Microvascular complications:   Neuropathy  Denies: CKD, retinopathy   Last eye exam: Completed 2020  Macrovascular complications:   Denies: CAD, PVD, CVA    HISTORY:  Past Medical History:  Past Medical  History:  Diagnosis Date  . Allergy   . Anemia   . Anxiety   . Arthritis   . Back pain   . Bipolar disorder ([place])   . CVA (cerebral infarction)   . Depression   . Diabetes mellitus without complication ([place])   . High cholesterol   . Hypertension   . Left leg pain   . Migraine   . Sleep apnea   . Stroke [facility])    Past Surgical History:  Past Surgical History:  Procedure Laterality Date  . ABDOMINAL HYSTERECTOMY    . ANTERIOR LATERAL LUMBAR FUSION WITH PERCUTANEOUS SCREW 1 LEVEL Left 02/11/2018   Procedure: LEFT LATERAL LUMBAR  FOUR-FIVE INTERBODY FUSION WITH INSTRUMENTATION AND ALLOGRAFT;  Surgeon: Phylliss Bob, MD;  Location: [facility];  Service: Orthopedics;  Laterality: Left;  LEFT LATERAL LUMBAR  FOUR-FIVE INTERBODY FUSION WITH INSTRUMENTATION AND ALLOGRAFT  . BACK SURGERY     Lumbar fusion L5-S1  . DIABETES    . EYE SURGERY Bilateral   . KNEE SURGERY    . l 4-l5 status post lateral posterior fusion January 30,2020    . NEUROFORAMINAL STENOSIS Left    Severe L4-L5, involving the level above previous fusion.  Marland Kitchen PARTIAL HYSTERECTOMY    . RADICULOPATHY Left    L4, Secondary to an L4-L5 Spondylolisthesis  . SHOULDER SURGERY      Social History:  reports that she quit smoking about 4 years ago. She has  never used smokeless tobacco. She reports current alcohol use. She reports previous drug use. Drug: Marijuana. Family History:  Family History  Problem Relation Age of Onset  . Diabetes Mother   . Stroke Father   . Cancer Paternal Aunt   . Cancer Paternal Aunt   . Diabetes Brother   . Colon cancer Neg Hx   . Esophageal cancer Neg Hx   . Rectal cancer Neg Hx   . Stomach cancer Neg Hx      HOME MEDICATIONS: Allergies as of 12/27/2019      Reactions   Cefazolin Swelling, Other (See Comments)   Rapid iv infusion caused swelling and burning at the iv site      Medication List       Accurate as of December 27, 2019 12:33 PM. If you have any questions, ask your nurse  or doctor.        Accu-Chek FastClix Lancets Misc TEST 4 TIMES DAILY. DX E11.9   Accu-Chek Guide Control Liqd 1 each by In Vitro route every 30 (thirty) days. Needs Accu-check guide monitor system. DX. E11.9 What changed: additional instructions   Accu-Chek Guide test strip Generic drug: glucose blood Use to test blood sugar 4 times a day.  Dx: E11.9   acetaminophen 325 MG tablet Commonly known as: Tylenol Take 2 tablets (650 mg total) by mouth every 6 (six) hours as needed.   albuterol 108 (90 Base) MCG/ACT inhaler Commonly known as: VENTOLIN HFA Inhale 2 puffs into the lungs daily as needed for wheezing or shortness of breath.   albuterol (2.5 MG/3ML) 0.083% nebulizer solution Commonly known as: PROVENTIL Take 2.5 mg by nebulization at bedtime as needed for wheezing.   ALPRAZolam 0.5 MG tablet Commonly known as: XANAX TAKE 1 TABLET BY MOUTH DAILY AS NEEDED FOR ANXIETY   amitriptyline 25 MG tablet Commonly known as: ELAVIL TAKE 1 TABLET(25 MG) BY MOUTH AT BEDTIME   atorvastatin 20 MG tablet Commonly known as: LIPITOR Take 1 tablet (20 mg total) by mouth daily.   Botox 100 units Solr injection Generic drug: botulinum toxin Type A PROVIDER TO INJECT 155 UNITS INTO THE MUSCLES OF THE HEAD AND NECK EVERY 3 MONTHS. DISCARD REMAINDER.   calcium-vitamin D 500-200 MG-UNIT tablet Commonly known as: OSCAL WITH D Take 1 tablet by mouth daily with breakfast.   cyclobenzaprine 10 MG tablet Commonly known as: FLEXERIL TAKE 1/2 TO 1 TABLET(5 TO 10 MG) BY MOUTH AT BEDTIME AS NEEDED FOR MUSCLE SPASMS   diclofenac Sodium 1 % Gel Commonly known as: Voltaren Apply 4 g topically 2 (two) times daily as needed.   DULoxetine 60 MG capsule Commonly known as: CYMBALTA Take 1 capsule (60 mg total) by mouth at bedtime.   ferrous sulfate 325 (65 FE) MG tablet Take 1 tablet (325 mg total) by mouth every other day.   fluticasone 50 MCG/ACT nasal spray Commonly known as:  FLONASE Place 2 sprays into both nostrils daily as needed for allergies.   FreeStyle Libre 2 Reader Devi 1 Device by Does not apply route as directed.   FreeStyle Libre 2 Sensor Misc 1 Device by Does not apply route as directed.   gabapentin 300 MG capsule Commonly known as: NEURONTIN 1 capsule twice dailty   insulin glargine 100 UNIT/ML Solostar Pen Commonly known as: LANTUS Inject 26 Units into the skin daily.   Insulin Pen Needle 31G X 5 MM Misc 1 Units by Does not apply route at bedtime.   ketorolac 0.4 %  Soln Commonly known as: ACULAR Place 1 drop into both eyes daily as needed (eye irritation).   levocetirizine 5 MG tablet Commonly known as: XYZAL Take 1 tablet (5 mg total) by mouth every evening.   loratadine 10 MG tablet Commonly known as: CLARITIN Take 1 tablet (10 mg total) by mouth daily. What changed:   when to take this  reasons to take this   meloxicam 15 MG tablet Commonly known as: MOBIC One tablet every 24 hours as needed for inflammation.   metFORMIN 500 MG 24 hr tablet Commonly known as: GLUCOPHAGE-XR Take 1 tablet (500 mg total) by mouth 2 (two) times daily with a meal.   naproxen 500 MG tablet Commonly known as: NAPROSYN Take 1 tablet (500 mg total) by mouth 2 (two) times daily.   tiZANidine 4 MG tablet Commonly known as: ZANAFLEX START 1 TABLET BY MOUTH AT NIGHT FOR 1 WEEK THEN 2 TIMES DAILY FOR 1 WEEK AND THEN TAKE 1 TABLET 3 TIMES DAILY THEREAFTER   topiramate 50 MG tablet Commonly known as: TOPAMAX Take 50 mg by mouth 2 (two) times daily.   traMADol 50 MG tablet Commonly known as: ULTRAM TAKE 1 TABLET(50 MG) BY MOUTH EVERY 8 HOURS AS NEEDED   triamcinolone ointment 0.1 % Commonly known as: KENALOG Apply topically 2 (two) times daily. For no more than 10 days.   Vitamin D3 1.25 MG (50000 UT) Caps Take 1 capsule by mouth once a week.        OBJECTIVE:   Vital Signs: There were no vitals taken for this visit.  Wt  Readings from Last 3 Encounters:  09/22/19 203 lb 12 oz (92.4 kg)  08/16/19 195 lb 6.4 oz (88.6 kg)  08/04/19 197 lb (89.4 kg)     Exam: General: Pt appears well and is in NAD  Lungs: Clear with good BS bilat with no rales, rhonchi, or wheezes  Heart: RRR with normal S1 and S2 and no gallops; no murmurs; no rub  Extremities: No pretibial edema.   Neuro: MS is good with appropriate affect, pt is alert and Ox3      DM Foot Exam 08/16/2019  The skin of the feet is intact without sores or ulcerations. The pedal pulses are 2+ on right and 2+ on left. The sensation is intact to a screening 5.07, 10 gram monofilament bilaterally    DATA REVIEWED:  Lab Results  Component Value Date   HGBA1C 8.1 (A) 08/16/2019   HGBA1C 14.4 (A) 05/03/2019   HGBA1C 14.6 (H) 02/04/2019   Lab Results  Component Value Date   MICROALBUR <0.7 09/22/2019   LDLCALC 116 (H) 09/22/2019   CREATININE 0.78 09/22/2019   Lab Results  Component Value Date   MICRALBCREAT 0.5 09/22/2019     Lab Results  Component Value Date   CHOL 189 09/22/2019   HDL 44.20 09/22/2019   LDLCALC 116 (H) 09/22/2019   TRIG 147.0 09/22/2019   CHOLHDL 4 09/22/2019        ASSESSMENT / PLAN / RECOMMENDATIONS:   1) Type 2 Diabetes Mellitus, with Improving glucose control, With neuropathic complications - Most recent A1c of 8.1 %. Goal A1c <7.0%.     - A1c down from 14.4%  - I have congratulated her on the great improvement in glucose control and encouraged her to continue with lifestyle changes and compliance.   - They continue to decline add-on therapy at this time, they would like to continue current regimen - They are interested in  a CGM, a prescription was sent     MEDICATIONS: Continue Metformin 500 mg 1 tablet with breakfast Continue  lantus  26 units daily    EDUCATION / INSTRUCTIONS:  BG monitoring instructions: Patient is instructed to check her blood sugars 2 times a day, fasting and bedtime as much as  possible  Call [facility] if: BG persistently  > 300. . I reviewed the Rule of 15 for the treatment of hypoglycemia in detail with the patient. Literature supplied.    2) Diabetic complications:   Eye: Does not have known diabetic retinopathy.   Neuro/ Feet: Does have known diabetic peripheral neuropathy.  Renal: Patient does not have known baseline CKD. She is not an ACEI/ARB at present      F/U in 4 months    Signed electronically by: Mack Guise, MD  [facility]  [facility] [address] [place], [place] [address] Phone: [phone] FAX: [phone]   CC: Elby Beck, [place] [address] Phone: [phone]  Fax: [phone]  Return to Endocrinology clinic as below: Future Appointments  Date Time Provider Friend  12/27/2019  3:40 PM Skyelyn Scruggs, Melanie Crazier, MD [facility] None  02/02/2020  8:15 AM Gardiner Barefoot, DPM [facility]

## 2020-01-13 ENCOUNTER — Telehealth: Payer: Self-pay | Admitting: Family Medicine

## 2020-01-13 NOTE — Telephone Encounter (Signed)
Patient is requesting a TOC. Her PCP is retiring. Please advise.

## 2020-01-16 NOTE — Telephone Encounter (Signed)
Yes, I will see her 

## 2020-01-19 NOTE — Telephone Encounter (Signed)
Noted  

## 2020-02-02 ENCOUNTER — Ambulatory Visit: Payer: Medicare HMO | Admitting: Podiatry

## 2020-02-10 ENCOUNTER — Telehealth: Payer: Self-pay

## 2020-02-10 DIAGNOSIS — G8929 Other chronic pain: Secondary | ICD-10-CM

## 2020-02-10 MED ORDER — VITAMIN D3 1.25 MG (50000 UT) PO CAPS: 1.0000 | ORAL_CAPSULE | ORAL | 0 refills | Status: AC

## 2020-02-10 NOTE — Telephone Encounter (Signed)
Pt called in wanted to get her tramadol and metformin refilled

## 2020-02-10 NOTE — Telephone Encounter (Signed)
Pharmacy requests refill on: Vitamin D3 50,000 IU   LAST REFILL: 11/15/2019 LAST OV: 09/22/2019 NEXT OV: 03/01/2020 PHARMACY: [place], [place]

## 2020-02-11 MED ORDER — TRAMADOL HCL 50 MG PO TABS: ORAL_TABLET | ORAL | 0 refills | Status: DC

## 2020-02-11 MED ORDER — METFORMIN HCL ER 500 MG PO TB24: 500.0000 mg | ORAL_TABLET | Freq: Two times a day (BID) | ORAL | 0 refills | Status: DC

## 2020-02-11 NOTE — Telephone Encounter (Signed)
I sent the prescription for tramadol and Metformin.  Her last vitamin D was normal and I think it makes sense to recheck her vitamin D before she continues high-dose replacement.  She has an appointment scheduled to follow-up with Dr. Bryan Lemma.  She going to get her labs rechecked there?  If so then I think it makes sense to cancel out her lab appointment that is scheduled here on 02/18/2020.

## 2020-02-11 NOTE — Telephone Encounter (Signed)
Spoke with patient and she is aware of rxs that were sent. She is going to check with Dr. Bryan Lemma office and see if they will do labs or not and call back to let us know.

## 2020-02-11 NOTE — Addendum Note (Signed)
Addended by: Tonia Ghent on: 02/11/2020 06:15 AM   Modules accepted: Orders

## 2020-02-18 ENCOUNTER — Ambulatory Visit: Payer: Medicare HMO

## 2020-03-01 ENCOUNTER — Encounter: Payer: Self-pay | Admitting: Gastroenterology

## 2020-03-01 ENCOUNTER — Encounter: Payer: Medicare HMO | Admitting: Family Medicine

## 2020-03-10 ENCOUNTER — Ambulatory Visit (HOSPITAL_COMMUNITY): Payer: Self-pay

## 2020-03-10 ENCOUNTER — Ambulatory Visit (HOSPITAL_COMMUNITY)
Admission: EM | Admit: 2020-03-10 | Discharge: 2020-03-10 | Disposition: A | Discharge status: Home / Self Care | Payer: Medicare HMO | Attending: Emergency Medicine | Admitting: Emergency Medicine

## 2020-03-10 ENCOUNTER — Other Ambulatory Visit: Payer: Self-pay

## 2020-03-10 ENCOUNTER — Encounter (HOSPITAL_COMMUNITY): Payer: Self-pay | Admitting: Emergency Medicine

## 2020-03-10 DIAGNOSIS — M25562 Pain in left knee: Secondary | ICD-10-CM | POA: Diagnosis not present

## 2020-03-10 DIAGNOSIS — I10 Essential (primary) hypertension: Secondary | ICD-10-CM

## 2020-03-10 MED ORDER — IBUPROFEN 800 MG PO TABS: 800.0000 mg | ORAL_TABLET | Freq: Three times a day (TID) | ORAL | 0 refills | Status: DC | PRN

## 2020-03-10 NOTE — Discharge Instructions (Addendum)
Take the ibuprofen as prescribed.  Rest and elevate your knee.  Apply ice packs 2-3 times a day for up to 20 minutes each.     Follow up with an orthopedist if your symptoms are not improving.    Your blood pressure is elevated today at 126/107.  Please have this rechecked by your primary care provider in 2-4 weeks.

## 2020-03-10 NOTE — ED Triage Notes (Signed)
Pt presents with left knee pain xs 3 days. States has knee replacement in left knee around 2008.  States has been using muscle rub, tylenol extra strength, and ibuprofen with some relief.   Describes the pain as a burning and numbness sensation.

## 2020-03-10 NOTE — ED Provider Notes (Signed)
[place]    CSN: [id number] Arrival date & time: 03/10/20  0802      History   Chief Complaint Chief Complaint  Patient presents with  . Knee Pain    Left    HPI Autumn Valenzuela is a 60 y.o. female.  Patient presents with left knee pain x3 days. No falls or injury. Patient has history of left knee replacement in 2008. She has chronic neuropathy from diabetes but denies numbness or weakness in her lower legs. She denies rash, redness, bruising, lesions, or other symptoms. Treatment attempted at home with muscle cream, Tylenol, ibuprofen. Her medical history includes hypertension, diabetes, diabetic neuropathy, CVA, chronic migraines, bipolar disorder.  The history is provided by the patient and medical records.    Past Medical History:  Diagnosis Date  . Allergy   . Anemia   . Anxiety   . Arthritis   . Back pain   . Bipolar disorder ([place])   . CVA (cerebral infarction)   . Depression   . Diabetes mellitus without complication ([place])   . High cholesterol   . Hypertension   . Left leg pain   . Migraine   . Sleep apnea   . Stroke [facility])     Patient Active Problem List   Diagnosis Date Noted  . Ulnar 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 of left upper extremity 08/09/2019  . Pincer nail deformity 05/19/2019  . Excessive daytime sleepiness 03/15/2019  . Type 2 diabetes mellitus with hyperglycemia, with long-term current use of insulin ([place]) 03/08/2019  . Type 2 diabetes mellitus with diabetic polyneuropathy, with long-term current use of insulin ([place]) 03/08/2019  . Diabetic neuropathy with neurologic complication ([place]) 78/29/5621  . Claudication of both lower extremities ([place]) 02/04/2019  . RLS (restless legs syndrome) 02/04/2019  . Sleep deprivation 02/04/2019  . Hot flashes 02/04/2019  . Neck pain 02/04/2019  . Chronic migraine without aura, with intractable migraine, so stated, with status migrainosus 01/27/2019  . Acute pain of both knees 06/18/2018  .  Radiculopathy 02/11/2018  . Bipolar disorder ([place]) 03/29/2016  . Diabetic ketoacidosis ([place]) 03/24/2016  . Dermatitis 03/24/2016  . HTN (hypertension) 03/24/2016  . HLD (hyperlipidemia) 03/24/2016  . DM (diabetes mellitus) ([place]) 03/24/2016  . Hyperglycemia   . Chronic daily headache 11/23/2012  . 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 Depression 11/23/2012    Past Surgical History:  Procedure Laterality Date  . ABDOMINAL HYSTERECTOMY    . ANTERIOR LATERAL LUMBAR FUSION WITH PERCUTANEOUS SCREW 1 LEVEL Left 02/11/2018   Procedure: LEFT LATERAL LUMBAR  FOUR-FIVE INTERBODY FUSION WITH INSTRUMENTATION AND ALLOGRAFT;  Surgeon: Phylliss Bob, MD;  Location: [place];  Service: Orthopedics;  Laterality: Left;  LEFT LATERAL LUMBAR  FOUR-FIVE INTERBODY FUSION WITH INSTRUMENTATION AND ALLOGRAFT  . BACK SURGERY     Lumbar fusion L5-S1  . DIABETES    . EYE SURGERY Bilateral   . KNEE SURGERY    . l 4-l5 status post lateral posterior fusion January 30,2020    . NEUROFORAMINAL STENOSIS Left    Severe L4-L5, involving the level above previous fusion.  Marland Kitchen PARTIAL HYSTERECTOMY    . RADICULOPATHY Left    L4, Secondary to an L4-L5 Spondylolisthesis  . SHOULDER SURGERY      OB History    Gravida  3   Para  3   Term      Preterm      AB      Living  3     SAB  IAB      Ectopic      Multiple      Live Births               Home Medications    Prior to Admission medications   Medication Sig Start Date End Date Taking? Authorizing Provider  ibuprofen (ADVIL) 800 MG tablet Take 1 tablet (800 mg total) by mouth every 8 (eight) hours as needed. 03/10/20  Yes Sharion Balloon, NP  Accu-Chek FastClix Lancets MISC TEST 4 TIMES DAILY. DX E11.9 07/16/18   Elby Beck, FNP  acetaminophen (TYLENOL) 325 MG tablet Take 2 tablets (650 mg total) by mouth every 6 (six) hours as needed. 06/14/19   Darr, Edison Nasuti, PA-C  albuterol (PROVENTIL) (2.5 MG/3ML) 0.083%  nebulizer solution Take 2.5 mg by nebulization at bedtime as needed for wheezing.    [provider]  albuterol (VENTOLIN HFA) 108 (90 Base) MCG/ACT inhaler Inhale 2 puffs into the lungs daily as needed for wheezing or shortness of breath. 02/19/19   Elby Beck, FNP  ALPRAZolam Duanne Moron) 0.5 MG tablet TAKE 1 TABLET BY MOUTH DAILY AS NEEDED FOR ANXIETY 12/24/19   Elby Beck, FNP  amitriptyline (ELAVIL) 25 MG tablet TAKE 1 TABLET(25 MG) BY MOUTH AT BEDTIME 07/15/18   Elby Beck, FNP  atorvastatin (LIPITOR) 20 MG tablet Take 1 tablet (20 mg total) by mouth daily. 10/08/18   Elby Beck, FNP  Blood Glucose Calibration (ACCU-CHEK GUIDE CONTROL) LIQD 1 each by In Vitro route every 30 (thirty) days. Needs Accu-check guide monitor system. DX. E11.9 Patient taking differently: 1 each by In Vitro route every 30 (thirty) days. Needs Accu-check guide monitor system. DX. E11.9 2 times daily 07/15/18   Elby Beck, FNP  botulinum toxin Type A (BOTOX) 100 units SOLR injection PROVIDER TO INJECT 155 UNITS INTO THE MUSCLES OF THE HEAD AND NECK EVERY 3 MONTHS. DISCARD REMAINDER. 02/17/19   Melvenia Beam, MD  calcium-vitamin D (OSCAL WITH D) 500-200 MG-UNIT per tablet Take 1 tablet by mouth daily with breakfast.    [provider]  Cholecalciferol (VITAMIN D3) 1.25 MG (50000 UT) CAPS Take 1 capsule by mouth once a week. 02/10/20   Tonia Ghent, MD  Continuous Blood Gluc Receiver (FREESTYLE LIBRE 2 READER) DEVI 1 Device by Does not apply route as directed. 08/17/19   Shamleffer, Melanie Crazier, MD  Continuous Blood Gluc Sensor (FREESTYLE LIBRE 2 SENSOR) MISC 1 Device by Does not apply route as directed. 08/17/19   Shamleffer, Melanie Crazier, MD  cyclobenzaprine (FLEXERIL) 10 MG tablet TAKE 1/2 TO 1 TABLET(5 TO 10 MG) BY MOUTH AT BEDTIME AS NEEDED FOR MUSCLE SPASMS 10/06/19   Elby Beck, FNP  DULoxetine (CYMBALTA) 60 MG capsule Take 1 capsule (60 mg total) by mouth at  bedtime. 08/19/19   Elby Beck, FNP  ferrous sulfate 325 (65 FE) MG tablet Take 1 tablet (325 mg total) by mouth every other day. 02/08/19   Elby Beck, FNP  fluticasone (FLONASE) 50 MCG/ACT nasal spray Place 2 sprays into both nostrils daily as needed for allergies. 09/22/19   Elby Beck, FNP  gabapentin (NEURONTIN) 300 MG capsule 1 capsule twice dailty 10/20/18   Garvin Fila, MD  glucose blood (ACCU-CHEK GUIDE) test strip Use to test blood sugar 4 times a day.  Dx: E11.9 07/16/18   Elby Beck, FNP  insulin glargine (LANTUS) 100 UNIT/ML Solostar Pen Inject 26 Units into the skin  daily. 05/17/19   Shamleffer, Melanie Crazier, MD  Insulin Pen Needle 31G X 5 MM MISC 1 Units by Does not apply route at bedtime. 05/14/19   Elby Beck, FNP  ketorolac (ACULAR) 0.4 % SOLN Place 1 drop into both eyes daily as needed (eye irritation).  04/08/16   [provider]  levocetirizine (XYZAL) 5 MG tablet Take 1 tablet (5 mg total) by mouth every evening. 04/20/18   Elby Beck, FNP  loratadine (CLARITIN) 10 MG tablet Take 1 tablet (10 mg total) by mouth daily. Patient taking differently: Take 10 mg by mouth daily as needed for allergies.  03/31/16   Julianne Rice, MD  metFORMIN (GLUCOPHAGE-XR) 500 MG 24 hr tablet Take 1 tablet (500 mg total) by mouth 2 (two) times daily with a meal. 02/11/20   Tonia Ghent, MD  tiZANidine (ZANAFLEX) 4 MG tablet START 1 TABLET BY MOUTH AT NIGHT FOR 1 WEEK THEN 2 TIMES DAILY FOR 1 WEEK AND THEN TAKE 1 TABLET 3 TIMES DAILY THEREAFTER 04/06/19   Garvin Fila, MD  topiramate (TOPAMAX) 50 MG tablet Take 50 mg by mouth 2 (two) times daily. 04/27/19   [provider]  traMADol (ULTRAM) 50 MG tablet TAKE 1 TABLET(50 MG) BY MOUTH EVERY 8 HOURS AS NEEDED 02/11/20   Tonia Ghent, MD  triamcinolone ointment (KENALOG) 0.1 % Apply topically 2 (two) times daily. For no more than 10 days. 06/22/18   Elby Beck, FNP    Family  History Family History  Problem Relation Age of Onset  . Diabetes Mother   . Stroke Father   . Cancer Paternal Aunt   . Cancer Paternal Aunt   . Diabetes Brother   . Colon cancer Neg Hx   . Esophageal cancer Neg Hx   . Rectal cancer Neg Hx   . Stomach cancer Neg Hx     Social History Social History   Tobacco Use  . Smoking status: Former Smoker    Quit date: 12/13/2015    Years since quitting: 4.2  . Smokeless tobacco: Never Used  Vaping Use  . Vaping Use: Never used  Substance Use Topics  . Alcohol use: Yes    Comment: Occas  . Drug use: Not Currently    Types: Marijuana    Comment: last weed use 26 Dec 2018     Allergies   Cefazolin   Review of Systems Review of Systems  Constitutional: Negative for chills and fever.  HENT: Negative for ear pain and sore throat.   Eyes: Negative for pain and visual disturbance.  Respiratory: Negative for cough and shortness of breath.   Cardiovascular: Negative for chest pain and palpitations.  Gastrointestinal: Negative for abdominal pain and vomiting.  Genitourinary: Negative for dysuria and hematuria.  Musculoskeletal: Positive for arthralgias. Negative for back pain.  Skin: Negative for color change and rash.  Neurological: Negative for syncope, weakness and numbness.  All other systems reviewed and are negative.    Physical Exam Triage Vital Signs ED Triage Vitals  Enc Vitals Group     BP      Pulse      Resp      Temp      Temp src      SpO2      Weight      Height      Head Circumference      Peak Flow      Pain Score      Pain  Loc      Pain Edu?      Excl. in [place]?    No data found.  Updated Vital Signs BP (!) 126/107 (BP Location: Left Arm)   Pulse 84   Temp 98.2 F (36.8 C) (Oral)   Resp 17   SpO2 99%   Visual Acuity Right Eye Distance:   Left Eye Distance:   Bilateral Distance:    Right Eye Near:   Left Eye Near:    Bilateral Near:     Physical Exam Vitals and nursing note  reviewed.  Constitutional:      General: She is not in acute distress.    Appearance: She is well-developed and well-nourished. She is not ill-appearing.  HENT:     Head: Normocephalic and atraumatic.     Mouth/Throat:     Mouth: Mucous membranes are moist.  Eyes:     Conjunctiva/sclera: Conjunctivae normal.  Cardiovascular:     Rate and Rhythm: Normal rate and regular rhythm.     Heart sounds: Normal heart sounds.  Pulmonary:     Effort: Pulmonary effort is normal. No respiratory distress.     Breath sounds: Normal breath sounds.  Abdominal:     Palpations: Abdomen is soft.     Tenderness: There is no abdominal tenderness.  Musculoskeletal:        General: Swelling and tenderness present. No deformity or edema. Normal range of motion.     Cervical back: Neck supple.       Legs:     Comments: Mild tenderness and mild edema of left medial anterior knee. No wounds, erythema, ecchymosis.  Skin:    General: Skin is warm and dry.     Findings: No bruising, erythema, lesion or rash.  Neurological:     General: No focal deficit present.     Mental Status: She is alert and oriented to person, place, and time.     Sensory: No sensory deficit.     Motor: No weakness.     Gait: Gait normal.  Psychiatric:        Mood and Affect: Mood and affect and mood normal.        Behavior: Behavior normal.      UC Treatments / Results  Labs (all labs ordered are listed, but only abnormal results are displayed) Labs Reviewed - No data to display  EKG   Radiology No results found.  Procedures Procedures (including critical care time)  Medications Ordered in UC Medications - No data to display  Initial Impression / Assessment and Plan / UC Course  I have reviewed the triage vital signs and the nursing notes.  Pertinent labs & imaging results that were available during my care of the patient were reviewed by me and considered in my medical decision making (see chart for details).    Acute left knee pain. Elevated blood pressure. Treating with ibuprofen, rest, elevation, ice packs. Instructed patient to follow-up with her orthopedist if her symptoms are not improving. Discussed that her blood pressure is elevated today needs to be rechecked by her PCP in 2 to 4 weeks. She agrees to plan of care.      Final Clinical Impressions(s) / UC Diagnoses   Final diagnoses:  Acute pain of left knee  Elevated blood pressure reading in office with diagnosis of hypertension     Discharge Instructions     Take the ibuprofen as prescribed.  Rest and elevate your knee.  Apply ice packs 2-3 times  a day for up to 20 minutes each.     Follow up with an orthopedist if your symptoms are not improving.    Your blood pressure is elevated today at 126/107.  Please have this rechecked by your primary care provider in 2-4 weeks.         ED Prescriptions    Medication Sig Dispense Auth. Provider   ibuprofen (ADVIL) 800 MG tablet Take 1 tablet (800 mg total) by mouth every 8 (eight) hours as needed. 21 tablet Sharion Balloon, NP     I have reviewed the PDMP during this encounter.   Sharion Balloon, NP 03/10/20 [phone]

## 2020-03-16 ENCOUNTER — Other Ambulatory Visit: Payer: Self-pay | Admitting: Family Medicine

## 2020-03-16 DIAGNOSIS — G8929 Other chronic pain: Secondary | ICD-10-CM

## 2020-03-16 NOTE — Telephone Encounter (Signed)
Refill request for Tramadol 50 mg tablets  LOV - 09/22/19 Next OV - not scheduled Last refilled - 02/11/20 #20/0

## 2020-03-17 NOTE — Telephone Encounter (Signed)
Sent. Thanks.  Needs TOC this spring with a provider here.

## 2020-04-05 ENCOUNTER — Encounter: Payer: Medicare HMO | Admitting: Family Medicine

## 2020-04-05 NOTE — Progress Notes (Deleted)
Autumn Valenzuela is a 60 y.o. female  No chief complaint on file.   HPI: Autumn Valenzuela is a 60 y.o. female patient previously seen by Aurora Mask at [facility] at [facility] here today to establish care with our office. She is accompanied by her daughter/POA. Pt complains of     Past Medical History:  Diagnosis Date  . Allergy   . Anemia   . Anxiety   . Arthritis   . Back pain   . Bipolar disorder ([place])   . CVA (cerebral infarction)   . Depression   . Diabetes mellitus without complication ([place])   . High cholesterol   . Hypertension   . Left leg pain   . Migraine   . Sleep apnea   . Stroke [facility])     Past Surgical History:  Procedure Laterality Date  . ABDOMINAL HYSTERECTOMY    . ANTERIOR LATERAL LUMBAR FUSION WITH PERCUTANEOUS SCREW 1 LEVEL Left 02/11/2018   Procedure: LEFT LATERAL LUMBAR  FOUR-FIVE INTERBODY FUSION WITH INSTRUMENTATION AND ALLOGRAFT;  Surgeon: Phylliss Bob, MD;  Location: [facility];  Service: Orthopedics;  Laterality: Left;  LEFT LATERAL LUMBAR  FOUR-FIVE INTERBODY FUSION WITH INSTRUMENTATION AND ALLOGRAFT  . BACK SURGERY     Lumbar fusion L5-S1  . DIABETES    . EYE SURGERY Bilateral   . KNEE SURGERY    . l 4-l5 status post lateral posterior fusion January 30,2020    . NEUROFORAMINAL STENOSIS Left    Severe L4-L5, involving the level above previous fusion.  Marland Kitchen PARTIAL HYSTERECTOMY    . RADICULOPATHY Left    L4, Secondary to an L4-L5 Spondylolisthesis  . SHOULDER SURGERY      Social History   Socioeconomic History  . Marital status: Single    Spouse name: Not on file  . Number of children: 3  . Years of education: 90  . Highest education level: Not on file  Occupational History  . Not on file  Tobacco Use  . Smoking status: Former Smoker    Quit date: 12/13/2015    Years since quitting: 4.3  . Smokeless tobacco: Never Used  Vaping Use  . Vaping Use: Never used  Substance and Sexual Activity  . Alcohol use: Yes    Comment: Occas   . Drug use: Not Currently    Types: Marijuana    Comment: last weed use 26 Dec 2018  . Sexual activity: Not Currently    Comment: 1st intercourse 60 yo-Fewer than 5 partners  Other Topics Concern  . Not on file  Social History Narrative   Patient is single with 3 children.   Patient is right handed.   Patient has a high school education.   Patient drinks at least 3 cups of caffeine daily.   Social Determinants of Health   Financial Resource Strain: Not on file  Food Insecurity: Not on file  Transportation Needs: Not on file  Physical Activity: Not on file  Stress: Not on file  Social Connections: Not on file  Intimate Partner Violence: Not on file    Family History  Problem Relation Age of Onset  . Diabetes Mother   . Stroke Father   . Cancer Paternal Aunt   . Cancer Paternal Aunt   . Diabetes Brother   . Colon cancer Neg Hx   . Esophageal cancer Neg Hx   . Rectal cancer Neg Hx   . Stomach cancer Neg Hx      Immunization History  Administered Date(s) Administered  . Moderna Sars-Covid-2 Vaccination 07/20/2019, 08/20/2019  . Pneumococcal Polysaccharide-23 08/25/2017    Outpatient Encounter Medications as of 04/05/2020  Medication Sig  . Accu-Chek FastClix Lancets MISC TEST 4 TIMES DAILY. DX E11.9  . acetaminophen (TYLENOL) 325 MG tablet Take 2 tablets (650 mg total) by mouth every 6 (six) hours as needed.  Marland Kitchen albuterol (PROVENTIL) (2.5 MG/3ML) 0.083% nebulizer solution Take 2.5 mg by nebulization at bedtime as needed for wheezing.  Marland Kitchen albuterol (VENTOLIN HFA) 108 (90 Base) MCG/ACT inhaler Inhale 2 puffs into the lungs daily as needed for wheezing or shortness of breath.  . ALPRAZolam (XANAX) 0.5 MG tablet TAKE 1 TABLET BY MOUTH DAILY AS NEEDED FOR ANXIETY  . amitriptyline (ELAVIL) 25 MG tablet TAKE 1 TABLET(25 MG) BY MOUTH AT BEDTIME  . atorvastatin (LIPITOR) 20 MG tablet Take 1 tablet (20 mg total) by mouth daily.  . Blood Glucose Calibration (ACCU-CHEK GUIDE  CONTROL) LIQD 1 each by In Vitro route every 30 (thirty) days. Needs Accu-check guide monitor system. DX. E11.9 (Patient taking differently: 1 each by In Vitro route every 30 (thirty) days. Needs Accu-check guide monitor system. DX. E11.9 2 times daily)  . botulinum toxin Type A (BOTOX) 100 units SOLR injection PROVIDER TO INJECT 155 UNITS INTO THE MUSCLES OF THE HEAD AND NECK EVERY 3 MONTHS. DISCARD REMAINDER.  . calcium-vitamin D (OSCAL WITH D) 500-200 MG-UNIT per tablet Take 1 tablet by mouth daily with breakfast.  . Cholecalciferol (VITAMIN D3) 1.25 MG (50000 UT) CAPS Take 1 capsule by mouth once a week.  . Continuous Blood Gluc Receiver (FREESTYLE LIBRE 2 READER) DEVI 1 Device by Does not apply route as directed.  . Continuous Blood Gluc Sensor (FREESTYLE LIBRE 2 SENSOR) MISC 1 Device by Does not apply route as directed.  . cyclobenzaprine (FLEXERIL) 10 MG tablet TAKE 1/2 TO 1 TABLET(5 TO 10 MG) BY MOUTH AT BEDTIME AS NEEDED FOR MUSCLE SPASMS  . DULoxetine (CYMBALTA) 60 MG capsule Take 1 capsule (60 mg total) by mouth at bedtime.  . ferrous sulfate 325 (65 FE) MG tablet Take 1 tablet (325 mg total) by mouth every other day.  . fluticasone (FLONASE) 50 MCG/ACT nasal spray Place 2 sprays into both nostrils daily as needed for allergies.  Marland Kitchen gabapentin (NEURONTIN) 300 MG capsule 1 capsule twice dailty  . glucose blood (ACCU-CHEK GUIDE) test strip Use to test blood sugar 4 times a day.  Dx: E11.9  . ibuprofen (ADVIL) 800 MG tablet Take 1 tablet (800 mg total) by mouth every 8 (eight) hours as needed.  . insulin glargine (LANTUS) 100 UNIT/ML Solostar Pen Inject 26 Units into the skin daily.  . Insulin Pen Needle 31G X 5 MM MISC 1 Units by Does not apply route at bedtime.  Marland Kitchen ketorolac (ACULAR) 0.4 % SOLN Place 1 drop into both eyes daily as needed (eye irritation).   Marland Kitchen levocetirizine (XYZAL) 5 MG tablet Take 1 tablet (5 mg total) by mouth every evening.  . loratadine (CLARITIN) 10 MG tablet Take 1  tablet (10 mg total) by mouth daily. (Patient taking differently: Take 10 mg by mouth daily as needed for allergies. )  . metFORMIN (GLUCOPHAGE-XR) 500 MG 24 hr tablet Take 1 tablet (500 mg total) by mouth 2 (two) times daily with a meal.  . tiZANidine (ZANAFLEX) 4 MG tablet START 1 TABLET BY MOUTH AT NIGHT FOR 1 WEEK THEN 2 TIMES DAILY FOR 1 WEEK AND THEN TAKE 1 TABLET 3 TIMES DAILY THEREAFTER  .  topiramate (TOPAMAX) 50 MG tablet Take 50 mg by mouth 2 (two) times daily.  . traMADol (ULTRAM) 50 MG tablet TAKE 1 TABLET(50 MG) BY MOUTH EVERY 8 HOURS AS NEEDED  . triamcinolone ointment (KENALOG) 0.1 % Apply topically 2 (two) times daily. For no more than 10 days.   No facility-administered encounter medications on file as of 04/05/2020.     ROS: Pertinent positives and negatives noted in HPI. Remainder of ROS non-contributory    Allergies  Allergen Reactions  . Cefazolin Swelling and Other (See Comments)    Rapid iv infusion caused swelling and burning at the iv site    There were no vitals taken for this visit.  Wt Readings from Last 3 Encounters:  09/22/19 203 lb 12 oz (92.4 kg)  08/16/19 195 lb 6.4 oz (88.6 kg)  08/04/19 197 lb (89.4 kg)   Temp Readings from Last 3 Encounters:  03/10/20 98.2 F (36.8 C) (Oral)  09/22/19 97.9 F (36.6 C) (Temporal)  06/14/19 98.1 F (36.7 C) (Oral)   BP Readings from Last 3 Encounters:  03/10/20 (!) 126/107  09/22/19 122/80  08/16/19 116/80   Pulse Readings from Last 3 Encounters:  03/10/20 84  09/22/19 100  08/16/19 84     Physical Exam   A/P:     This visit occurred during the SARS-CoV-2 public health emergency.  Safety protocols were in place, including screening questions prior to the visit, additional usage of staff PPE, and extensive cleaning of exam room while observing appropriate contact time as indicated for disinfecting solutions.

## 2020-04-06 ENCOUNTER — Other Ambulatory Visit: Payer: Self-pay

## 2020-04-06 ENCOUNTER — Other Ambulatory Visit: Payer: Self-pay | Admitting: Nurse Practitioner

## 2020-04-06 ENCOUNTER — Telehealth: Payer: Self-pay | Admitting: Neurology

## 2020-04-06 ENCOUNTER — Ambulatory Visit (INDEPENDENT_AMBULATORY_CARE_PROVIDER_SITE_OTHER): Payer: Medicare HMO | Admitting: Nurse Practitioner

## 2020-04-06 ENCOUNTER — Telehealth: Payer: Self-pay | Admitting: Nurse Practitioner

## 2020-04-06 ENCOUNTER — Encounter: Payer: Self-pay | Admitting: Nurse Practitioner

## 2020-04-06 ENCOUNTER — Other Ambulatory Visit: Payer: Self-pay | Admitting: Family Medicine

## 2020-04-06 VITALS — BP 126/86 | HR 90 | Temp 96.5°F | Ht 61.0 in | Wt 206.6 lb

## 2020-04-06 DIAGNOSIS — I10 Essential (primary) hypertension: Secondary | ICD-10-CM

## 2020-04-06 DIAGNOSIS — F3170 Bipolar disorder, currently in remission, most recent episode unspecified: Secondary | ICD-10-CM | POA: Diagnosis not present

## 2020-04-06 DIAGNOSIS — E1140 Type 2 diabetes mellitus with diabetic neuropathy, unspecified: Secondary | ICD-10-CM | POA: Diagnosis not present

## 2020-04-06 DIAGNOSIS — G2581 Restless legs syndrome: Secondary | ICD-10-CM | POA: Diagnosis not present

## 2020-04-06 DIAGNOSIS — G8929 Other chronic pain: Secondary | ICD-10-CM

## 2020-04-06 DIAGNOSIS — R69 Illness, unspecified: Secondary | ICD-10-CM | POA: Diagnosis not present

## 2020-04-06 DIAGNOSIS — G5622 Lesion of ulnar nerve, left upper limb: Secondary | ICD-10-CM

## 2020-04-06 DIAGNOSIS — E1149 Type 2 diabetes mellitus with other diabetic neurological complication: Secondary | ICD-10-CM

## 2020-04-06 DIAGNOSIS — Z1231 Encounter for screening mammogram for malignant neoplasm of breast: Secondary | ICD-10-CM

## 2020-04-06 MED ORDER — DULOXETINE HCL 60 MG PO CPEP: 60.0000 mg | ORAL_CAPSULE | Freq: Every day | ORAL | 3 refills | Status: DC

## 2020-04-06 MED ORDER — GABAPENTIN 300 MG PO CAPS
ORAL_CAPSULE | ORAL | 3 refills | Status: DC
Start: 2020-04-06 — End: 2021-01-17

## 2020-04-06 NOTE — Telephone Encounter (Signed)
Called pt back and LVM with office number for call back.

## 2020-04-06 NOTE — Patient Instructions (Addendum)
Schedule appt with [facility] ([phone]) and [facility] ([phone])

## 2020-04-06 NOTE — Telephone Encounter (Signed)
Pt called and said she talked to her neurologist and they said they dont do the palm of the hand, They said you would need to put in a referral for someone. Please advise

## 2020-04-06 NOTE — Telephone Encounter (Signed)
Pt asking for a call to discuss knot in right hand she has had for 2 months.  Pt also wants to discuss fingers on left hand causing problems where she is unable to wear her brace. Pt has scheduled a f/u and is on wait list, please call.

## 2020-04-06 NOTE — Assessment & Plan Note (Addendum)
Current use of cymbalta and gabapentin: refill sent Under Care of Dr. Prudence Davidson for neuropathy and toenail trimming. Hx of plantar fasicitis Quit tobacco use 67yrs ago per patient Denies any claudication, no LE edema, normal pedal pulses.

## 2020-04-06 NOTE — Assessment & Plan Note (Signed)
Under care of Dr. Jaynee Eagles for ulnar neuropathy. botox injection was recommended and approved by her insurance per telephone note on 08/30/2019

## 2020-04-06 NOTE — Progress Notes (Signed)
Subjective:  Patient ID: Autumn Valenzuela, female    DOB: 01-Jul-1960  Age: 60 y.o. MRN: [id number]  CC: transfer of care (Pt is here for transfer of care, knot in right hand, in left hand pen and needles going through middle finger, and knuckles, has wrist brace and elbow pad. Pt denies any injury to either hand to cause knot or discomfort )  HPI Transfer from [facility] Under care of Dr. Magda Bernheim for DM management Under Care of Dr. Prudence Davidson for neuropathy and toenail trimming. Under care of Dr. Jaynee Eagles for ulnar neuropathy.  HTN (hypertension) BP at goal No medication needed at this time BP Readings from Last 3 Encounters:  04/06/20 126/86  03/10/20 (!) 126/107  09/22/19 122/80    Diabetic neuropathy with neurologic complication (HCC) Current use of cymbalta and gabapentin: refill sent Under Care of Dr. Prudence Davidson for neuropathy and toenail trimming. Hx of plantar fasicitis Quit tobacco use 36yrs ago per patient Denies any claudication, no LE edema, normal pedal pulses.  Ulnar 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 of left upper extremity Under care of Dr. Jaynee Eagles for ulnar neuropathy. botox injection was recommended and approved by her insurance per telephone note on 08/30/2019  Reviewed past Medical, Social and Family history today.  Outpatient Medications Prior to Visit  Medication Sig Dispense Refill  . Accu-Chek FastClix Lancets MISC TEST 4 TIMES DAILY. DX E11.9 306 each 3  . acetaminophen (TYLENOL) 325 MG tablet Take 2 tablets (650 mg total) by mouth every 6 (six) hours as needed. 30 tablet 0  . albuterol (PROVENTIL) (2.5 MG/3ML) 0.083% nebulizer solution Take 2.5 mg by nebulization at bedtime as needed for wheezing.    Marland Kitchen albuterol (VENTOLIN HFA) 108 (90 Base) MCG/ACT inhaler Inhale 2 puffs into the lungs daily as needed for wheezing or shortness of breath. 18 g 2  . ALPRAZolam (XANAX) 0.5 MG tablet TAKE 1 TABLET BY MOUTH DAILY AS NEEDED FOR ANXIETY 30 tablet 1  . atorvastatin (LIPITOR) 20  MG tablet Take 1 tablet (20 mg total) by mouth daily. 90 tablet 3  . Blood Glucose Calibration (ACCU-CHEK GUIDE CONTROL) LIQD 1 each by In Vitro route every 30 (thirty) days. Needs Accu-check guide monitor system. DX. E11.9 (Patient taking differently: 1 each by In Vitro route every 30 (thirty) days. Needs Accu-check guide monitor system. DX. E11.9 2 times daily) 3 each 3  . calcium-vitamin D (OSCAL WITH D) 500-200 MG-UNIT per tablet Take 1 tablet by mouth daily with breakfast.    . Cholecalciferol (VITAMIN D3) 1.25 MG (50000 UT) CAPS Take 1 capsule by mouth once a week. 4 capsule 0  . Continuous Blood Gluc Receiver (FREESTYLE LIBRE 2 READER) DEVI 1 Device by Does not apply route as directed. 1 each 0  . Continuous Blood Gluc Sensor (FREESTYLE LIBRE 2 SENSOR) MISC 1 Device by Does not apply route as directed. 2 each 11  . cyclobenzaprine (FLEXERIL) 10 MG tablet TAKE 1/2 TO 1 TABLET(5 TO 10 MG) BY MOUTH AT BEDTIME AS NEEDED FOR MUSCLE SPASMS 30 tablet 1  . ferrous sulfate 325 (65 FE) MG tablet Take 1 tablet (325 mg total) by mouth every other day. 45 tablet 3  . fluticasone (FLONASE) 50 MCG/ACT nasal spray Place 2 sprays into both nostrils daily as needed for allergies. 16 g 5  . glucose blood (ACCU-CHEK GUIDE) test strip Use to test blood sugar 4 times a day.  Dx: E11.9 400 each 3  . ibuprofen (ADVIL) 800 MG tablet Take 1 tablet (800  mg total) by mouth every 8 (eight) hours as needed. 21 tablet 0  . insulin glargine (LANTUS) 100 UNIT/ML Solostar Pen Inject 26 Units into the skin daily. 15 mL 6  . Insulin Pen Needle 31G X 5 MM MISC 1 Units by Does not apply route at bedtime. 90 each 3  . ketorolac (ACULAR) 0.4 % SOLN Place 1 drop into both eyes daily as needed (eye irritation).     Marland Kitchen levocetirizine (XYZAL) 5 MG tablet Take 1 tablet (5 mg total) by mouth every evening. 30 tablet 2  . loratadine (CLARITIN) 10 MG tablet Take 1 tablet (10 mg total) by mouth daily. (Patient taking differently: Take 10 mg by  mouth daily as needed for allergies.) 30 tablet 0  . metFORMIN (GLUCOPHAGE-XR) 500 MG 24 hr tablet Take 1 tablet (500 mg total) by mouth 2 (two) times daily with a meal. 180 tablet 0  . tiZANidine (ZANAFLEX) 4 MG tablet START 1 TABLET BY MOUTH AT NIGHT FOR 1 WEEK THEN 2 TIMES DAILY FOR 1 WEEK AND THEN TAKE 1 TABLET 3 TIMES DAILY THEREAFTER 90 tablet 2  . topiramate (TOPAMAX) 50 MG tablet Take 50 mg by mouth 2 (two) times daily.    . traMADol (ULTRAM) 50 MG tablet TAKE 1 TABLET(50 MG) BY MOUTH EVERY 8 HOURS AS NEEDED 20 tablet 0  . triamcinolone ointment (KENALOG) 0.1 % Apply topically 2 (two) times daily. For no more than 10 days. 30 g 1  . amitriptyline (ELAVIL) 25 MG tablet TAKE 1 TABLET(25 MG) BY MOUTH AT BEDTIME 90 tablet 1  . DULoxetine (CYMBALTA) 60 MG capsule Take 1 capsule (60 mg total) by mouth at bedtime. 90 capsule 1  . gabapentin (NEURONTIN) 300 MG capsule 1 capsule twice dailty 60 capsule 1  . botulinum toxin Type A (BOTOX) 100 units SOLR injection PROVIDER TO INJECT 155 UNITS INTO THE MUSCLES OF THE HEAD AND NECK EVERY 3 MONTHS. DISCARD REMAINDER. (Patient not taking: Reported on 04/06/2020) 2 each 1   No facility-administered medications prior to visit.    ROS See HPI  Objective:  BP 126/86 (BP Location: Left Arm, Patient Position: Sitting, Cuff Size: Large)   Pulse 90   Temp (!) 96.5 F (35.8 C) (Temporal)   Ht 5\' 1"  (1.549 m)   Wt 206 lb 9.6 oz (93.7 kg)   SpO2 96%   BMI 39.04 kg/m   Physical Exam Cardiovascular:     Rate and Rhythm: Normal rate and regular rhythm.     Pulses: Normal pulses.          Dorsalis pedis pulses are 2+ on the right side and 2+ on the left side.       Posterior tibial pulses are 2+ on the right side and 2+ on the left side.     Heart sounds: Normal heart sounds.  Pulmonary:     Effort: Pulmonary effort is normal.     Breath sounds: Normal breath sounds.  Musculoskeletal:     Right lower leg: No edema.     Left lower leg: No edema.      Right foot: Normal range of motion. Prominent metatarsal heads present. No bunion.     Left foot: Normal range of motion. Prominent metatarsal heads present. No bunion.  Feet:     Right foot:     Protective Sensation: 10 sites tested. 10 sites sensed.     Skin integrity: Callus and dry skin present. No ulcer, blister, skin breakdown, erythema, warmth or fissure.  Toenail Condition: Right toenails are abnormally thick and long.     Left foot:     Protective Sensation: 10 sites tested. 10 sites sensed.     Skin integrity: Callus and dry skin present. No ulcer, blister, skin breakdown, erythema, warmth or fissure.     Toenail Condition: Left toenails are abnormally thick and long.  Skin:    Findings: No erythema or rash.  Neurological:     Mental Status: She is alert and oriented to person, place, and time.    Assessment & Plan:  This visit occurred during the SARS-CoV-2 public health emergency.  Safety protocols were in place, including screening questions prior to the visit, additional usage of staff PPE, and extensive cleaning of exam room while observing appropriate contact time as indicated for disinfecting solutions.   Kegan was seen today for transfer of care.  Diagnoses and all orders for this visit:  Ulnar 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 of left upper extremity  Diabetic neuropathy with neurologic complication (HCC) -     gabapentin (NEURONTIN) 300 MG capsule; 1 capsule twice dailty  Primary hypertension  Bipolar disorder in full remission, most recent episode unspecified type ([place]) -     DULoxetine (CYMBALTA) 60 MG capsule; Take 1 capsule (60 mg total) by mouth at bedtime.  RLS (restless legs syndrome) -     gabapentin (NEURONTIN) 300 MG capsule; 1 capsule twice dailty   Problem List Items Addressed This Visit      Cardiovascular and Mediastinum   HTN (hypertension)    BP at goal No medication needed at this time BP Readings from Last 3 Encounters:  04/06/20 126/86   03/10/20 (!) 126/107  09/22/19 122/80          Endocrine   Diabetic neuropathy with neurologic complication (HCC)    Current use of cymbalta and gabapentin: refill sent Under Care of Dr. Prudence Davidson for neuropathy and toenail trimming. Hx of plantar fasicitis Quit tobacco use 42yrs ago per patient Denies any claudication, no LE edema, normal pedal pulses.      Relevant Medications   gabapentin (NEURONTIN) 300 MG capsule     Nervous and Auditory   Ulnar 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 of left upper extremity - Primary    Under care of Dr. Jaynee Eagles for ulnar neuropathy. botox injection was recommended and approved by her insurance per telephone note on 08/30/2019      Relevant Medications   DULoxetine (CYMBALTA) 60 MG capsule   gabapentin (NEURONTIN) 300 MG capsule     Other   Bipolar disorder (HCC) (Chronic)   Relevant Medications   DULoxetine (CYMBALTA) 60 MG capsule   RLS (restless legs syndrome)   Relevant Medications   gabapentin (NEURONTIN) 300 MG capsule      Follow-up: Return in about 3 months (around 07/07/2020) for HTN and , hyperlipidemia, depression (fasting, complete PHQ/GAD).  Wilfred Lacy, NP

## 2020-04-06 NOTE — Telephone Encounter (Signed)
The patient returned my call.  She is wearing the brace on the left elbow while she sleeps at night. She also has a brace for the hand.  The patient stated her middle finger on the L hand has started having a shocking feeling when she straightens it out.  She balls her fist to try to help and also soaks her hands in epsom salt baths.  She has also had a knot in her right hand for 2 months.  She saw her new primary care provider today and she said she was told the suggestion to follow up with Dr Jaynee Eagles.  Patient is fine waiting for an appointment with Dr. Jaynee Eagles so that this can be evaluated in person.  She is appreciative for being on the wait list.  She says she will continue to do what she has been doing until then.  Patient verbalized appreciation for the call.

## 2020-04-06 NOTE — Assessment & Plan Note (Signed)
BP at goal No medication needed at this time BP Readings from Last 3 Encounters:  04/06/20 126/86  03/10/20 (!) 126/107  09/22/19 122/80

## 2020-04-07 ENCOUNTER — Other Ambulatory Visit: Payer: Self-pay | Admitting: Neurology

## 2020-04-07 DIAGNOSIS — R200 Anesthesia of skin: Secondary | ICD-10-CM

## 2020-04-07 DIAGNOSIS — G5623 Lesion of ulnar nerve, bilateral upper limbs: Secondary | ICD-10-CM

## 2020-04-07 DIAGNOSIS — M79643 Pain in unspecified hand: Secondary | ICD-10-CM

## 2020-04-07 NOTE — Telephone Encounter (Signed)
Pt daughter notified per HIPAA and confirmed understanding.

## 2020-04-07 NOTE — Telephone Encounter (Signed)
Dr. Jaynee Eagles has already entered a referral to ortho. i also refilled her gabapentin and cymbalta

## 2020-04-07 NOTE — Telephone Encounter (Signed)
At emg/ncs I specified I would send her to orthopaedics next, I don;t have anything else for her but she needs to see orthopaedics I will place referral and she can cancel our appointment thanks

## 2020-04-09 ENCOUNTER — Other Ambulatory Visit: Payer: Self-pay | Admitting: Neurology

## 2020-04-09 DIAGNOSIS — M79602 Pain in left arm: Secondary | ICD-10-CM

## 2020-04-11 ENCOUNTER — Ambulatory Visit (INDEPENDENT_AMBULATORY_CARE_PROVIDER_SITE_OTHER): Payer: Medicare HMO | Admitting: Podiatry

## 2020-04-11 ENCOUNTER — Ambulatory Visit (INDEPENDENT_AMBULATORY_CARE_PROVIDER_SITE_OTHER): Payer: Medicare HMO

## 2020-04-11 ENCOUNTER — Other Ambulatory Visit: Payer: Self-pay

## 2020-04-11 DIAGNOSIS — M2141 Flat foot [pes planus] (acquired), right foot: Secondary | ICD-10-CM

## 2020-04-11 DIAGNOSIS — M76821 Posterior tibial tendinitis, right leg: Secondary | ICD-10-CM

## 2020-04-11 DIAGNOSIS — E1149 Type 2 diabetes mellitus with other diabetic neurological complication: Secondary | ICD-10-CM

## 2020-04-11 DIAGNOSIS — E1140 Type 2 diabetes mellitus with diabetic neuropathy, unspecified: Secondary | ICD-10-CM | POA: Diagnosis not present

## 2020-04-11 DIAGNOSIS — E1165 Type 2 diabetes mellitus with hyperglycemia: Secondary | ICD-10-CM

## 2020-04-11 DIAGNOSIS — M2142 Flat foot [pes planus] (acquired), left foot: Secondary | ICD-10-CM | POA: Diagnosis not present

## 2020-04-11 DIAGNOSIS — M79671 Pain in right foot: Secondary | ICD-10-CM

## 2020-04-11 DIAGNOSIS — M79672 Pain in left foot: Secondary | ICD-10-CM

## 2020-04-11 DIAGNOSIS — Z794 Long term (current) use of insulin: Secondary | ICD-10-CM

## 2020-04-11 NOTE — Telephone Encounter (Signed)
Spoke with patient. She has an appointment with orthopedics, Dr Erlinda Hong, on 4/5. Pt aware per Dr Jaynee Eagles pt has been referred there as she had nothing further after EMG/NCS. Pt agreed to cancel appt with Dr Jaynee Eagles for July. She verbalized appreciation for the call.

## 2020-04-11 NOTE — Progress Notes (Signed)
  Subjective:  Patient ID: Autumn Valenzuela, female    DOB: December 18, 1960,  MRN: [id number]  Chief Complaint  Patient presents with  . Foot Pain    Bilateral foot pain right is worse than left    60 y.o. female presents with the above complaint. History confirmed with patient.   Objective:  Physical Exam: warm, good capillary refill, no trophic changes or ulcerative lesions, normal DP and PT pulses and normal sensory exam. Left Foot: She has semi-reducible hammertoe contractures, pes planus Right Foot: Pes planus deformity, she has pain on palpation of the posterior tibial tendon along its course and at the insertion of the navicular, pain with resisted eversion.  She is unable to do a single heel rise.   Radiographs: X-ray of both feet: no fracture, dislocation, swelling or degenerative changes noted and pes planus Assessment:   1. Pain in both feet   2. Pes planus of both feet   3. Posterior tibial tendon dysfunction (PTTD) of right lower extremity   4. Diabetic neuropathy with neurologic complication ([place])   5. Type 2 diabetes mellitus with hyperglycemia, with long-term current use of insulin ([place])      Plan:  Patient was evaluated and treated and all questions answered.  Discussed the etiology and treatment options for posterior tibial tendinitis including stretching, formal physical therapy, supportive shoegears such as a running shoe or sneaker, pre fabricated orthoses, injection therapy, and oral medications. We also discussed the role of surgical treatment of this for patients who do not improve after exhausting non-surgical treatment options.  -XR reviewed with patient -Educated patient on stretching and icing of the affected limb  -OTC Aleve or Motrin for pain -Consider CAM boot or Tri-Lock ankle brace immobilization and/or MRI with physical therapy referral at next visit if not improving  Return in about 2 months (around 06/11/2020).

## 2020-04-11 NOTE — Patient Instructions (Signed)
Look for Voltaren gel at the pharmacy over the counter or online (also known as diclofenac 1% gel). Apply to the painful areas 3-4x daily with the supplied dosing card. Allow to dry for 10 minutes before going into socks/shoes    Posterior Tibial Tendinitis  Posterior tibial tendinitis is irritation of a tendon called the posterior tibial tendon. Your posterior tibial tendon is a cord-like tissue that connects bones of your lower leg and foot to a muscle that: 1. Supports your arch. 2. Helps you raise up on your toes. 3. Helps you turn your foot down and in. This condition causes foot and ankle pain. It can also lead to a flat foot. What are the causes? This condition is most often caused by repeated stress to the tendon (overuse injury). It can also be caused by a sudden injury that stresses the tendon, such as landing on your foot after jumping or falling. What increases the risk? This condition is more likely to develop in: 1. People who play a sport that involves putting a lot of pressure on the feet, such as: 1. Basketball. 2. Tennis. 3. Soccer. 4. Hockey. 2. Runners. 3. Females who are older than 58 years of age and are overweight. 4. People with diabetes. 5. People with decreased foot stability. 6. People with flat feet. What are the signs or symptoms? Symptoms include: 1. Pain in the inner ankle. 2. Pain at the arch of your foot. 3. Pain that gets worse with running, walking, or standing. 4. Swelling on the inside of your ankle and foot. 5. Weakness in your ankle or foot. 6. Inability to stand up on tiptoe. 7. Flattening of the arch of your foot. How is this diagnosed? This condition may be diagnosed based on: 1. Your symptoms. 2. Your medical history. 3. A physical exam. 4. Tests, such as: 1. X-ray. 2. MRI. 3. Ultrasound. How is this treated? This condition may be treated by: 1. Putting ice to the injured area. 2. Taking NSAIDs, such as ibuprofen, to reduce pain  and swelling. 3. Wearing a special shoe or shoe insert to support your arch (orthotic). 4. Having physical therapy. 5. Replacing high-impact exercise with low-impact exercise, such as swimming or cycling. If your symptoms do not improve with these treatments, you may need to wear a splint, removable walking boot, or short leg cast for 6-8 weeks to keep your foot and ankle still (immobilized). Follow these instructions at home: If you have a cast, splint, or boot:  Keep it clean and dry.  Check the skin around it every day. Tell your health care provider about any concerns. If you have a cast:  Do not stick anything inside it to scratch your skin. Doing that increases your risk of infection.  You may put lotion on dry skin around the edges of the cast. Do not put lotion on the skin underneath the cast. If you have a splint or boot:  Wear it as told by your health care provider. Remove it only as told by your health care provider.  Loosen it if your toes tingle, become numb, or turn cold and blue. Bathing 1. Do not take baths, swim, or use a hot tub until your health care provider approves. Ask your health care provider if you may take showers. 2. If your cast, splint, or boot is not waterproof: ? Do not let it get wet. ? Cover it with a waterproof covering while you take a bath or a shower. Managing pain and  swelling   1. If directed, put ice on the injured area. ? If you have a removable splint or boot, remove it as told by your health care provider. ? Put ice in a plastic bag. ? Place a towel between your skin and the bag or between your cast and the bag. ? Leave the ice on for 20 minutes, 2-3 times a day. 2. Move your toes often to reduce stiffness and swelling. 3. Raise (elevate) the injured area above the level of your heart while you are sitting or lying down. Activity  Do not use the injured foot to support your body weight until your health care provider says that you  can. Use crutches as told by your health care provider.  Do not do activities that make pain or swelling worse.  Ask your health care provider when it is safe to drive if you have a cast, splint, or boot on your foot.  Return to your normal activities as told by your health care provider. Ask your health care provider what activities are safe for you.  Do exercises as told by your health care provider. General instructions  Take over-the-counter and prescription medicines only as told by your health care provider.  If you have an orthotic, use it as told by your health care provider.  Keep all follow-up visits as told by your health care provider. This is important. How is this prevented?  Wear footwear that is appropriate to your athletic activity.  Avoid athletic activities that cause pain or swelling in your ankle or foot.  Before being active, do range-of-motion and stretching exercises.  If you develop pain or swelling while training, stop training.  If you have pain or swelling that does not improve after a few days of rest, see your health care provider.  If you start a new athletic activity, start gradually so you can build up your strength and flexibility. Contact a health care provider if:  Your symptoms get worse.  Your symptoms do not improve in 6-8 weeks.  You develop new, unexplained symptoms.  Your splint, boot, or cast gets damaged. Summary  Posterior tibial tendinitis is irritation of a tendon called the posterior tibial tendon.  This condition is most often caused by repeated stress to the tendon (overuse injury).  This condition causes foot pain and ankle pain. It can also lead to a flat foot.  This condition may be treated by not doing high-impact activities, applying ice, having physical therapy, wearing orthotics, and wearing a cast, splint, or boot if needed. This information is not intended to replace advice given to you by your health care  provider. Make sure you discuss any questions you have with your health care provider. Document Revised: 04/28/2018 Document Reviewed: 03/05/2018 Elsevier Patient Education  [place].  Posterior Tibial Tendinitis Rehab Ask your health care provider which exercises are safe for you. Do exercises exactly as told by your health care provider and adjust them as directed. It is normal to feel mild stretching, pulling, tightness, or discomfort as you do these exercises. Stop right away if you feel sudden pain or your pain gets worse. Do not begin these exercises until told by your health care provider. Stretching and range-of-motion exercises These exercises warm up your muscles and joints and improve the movement and flexibility in your ankle and foot. These exercises may also help to relieve pain. Standing wall calf stretch, knee straight   4. Stand with your hands against a wall.  5. Extend your left / right leg behind you, and bend your front knee slightly. If directed, place a folded washcloth under the arch of your foot for support. 6. Point the toes of your back foot slightly inward. 7. Keeping your heels on the floor and your back knee straight, shift your weight toward the wall. Do not allow your back to arch. You should feel a gentle stretch in your upper left / right calf. 8. Hold this position for 10 seconds. Repeat 10 times. Complete this exercise 2 times a day. Standing wall calf stretch, knee bent 7. Stand with your hands against a wall. 8. Extend your left / right leg behind you, and bend your front knee slightly. If directed, place a folded washcloth under the arch of your foot for support. 9. Point the toes of your back foot slightly inward. 10. Unlock your back knee so it is bent. Keep your heels on the floor. You should feel a gentle stretch deep in your lower left / right calf. 11. Hold this position for 10 seconds. Repeat 10 times. Complete this exercise 2 times a  day. Strengthening exercises These exercises build strength and endurance in your ankle and foot. Endurance is the ability to use your muscles for a long time, even after they get tired. Ankle inversion with band 8. Secure one end of a rubber exercise band or tubing to a fixed object, such as a table leg or a pole, that will stay still when the band is pulled. 9. Loop the other end of the band around the middle of your left / right foot. 10. Sit on the floor facing the object with your left / right leg extended. The band or tube should be slightly tense when your foot is relaxed. 83. Leading with your big toe, slowly bring your left / right foot and ankle inward, toward your other foot (inversion). 12. Hold this position for 10 seconds. 13. Slowly return your foot to the starting position. Repeat 10 times. Complete this exercise 2 times a day. Towel curls   5. Sit in a chair on a non-carpeted surface, and put your feet on the floor. 6. Place a towel in front of your feet. 7. Keeping your heel on the floor, put your left / right foot on the towel. 8. Pull the towel toward you by grabbing the towel with your toes and curling them under. Keep your heel on the floor while you do this. 9. Let your toes relax. 10. Grab the towel with your toes again. Keep going until the towel is completely underneath your foot. Repeat 10 times. Complete this exercise 2 times a day. Balance exercise This exercise improves or maintains your balance. Balance is important in preventing falls. Single leg stand 6. Without wearing shoes, stand near a railing or in a doorway. You may hold on to the railing or door frame as needed for balance. 7. Stand on your left / right foot. Keep your big toe down on the floor and try to keep your arch lifted. ? If balancing in this position is too easy, try the exercise with your eyes closed or while standing on a pillow. 8. Hold this position for 10 seconds. Repeat 10 times.  Complete this exercise 2 times a day. This information is not intended to replace advice given to you by your health care provider. Make sure you discuss any questions you have with your health care provider.

## 2020-04-17 ENCOUNTER — Telehealth: Payer: Self-pay | Admitting: Neurology

## 2020-04-17 NOTE — Telephone Encounter (Signed)
Spoke with patient. Autumn Valenzuela could not recall why the July appt was canceled.  We discussed that Dr. Jaynee Eagles had said Autumn Valenzuela did not have anything further for the patient and Autumn Valenzuela needed to see orthopedics.  Patient has an appointment tomorrow and Autumn Valenzuela will address her issues with them during the appt.

## 2020-04-17 NOTE — Telephone Encounter (Signed)
Pt called, would like to discuss with someone about scheduling my cancelled appt. Because of my right I had to see a different physician. Would like a call from the nurse.

## 2020-04-18 ENCOUNTER — Ambulatory Visit (INDEPENDENT_AMBULATORY_CARE_PROVIDER_SITE_OTHER): Payer: Medicare HMO | Admitting: Orthopaedic Surgery

## 2020-04-18 ENCOUNTER — Ambulatory Visit (INDEPENDENT_AMBULATORY_CARE_PROVIDER_SITE_OTHER): Payer: Medicare HMO

## 2020-04-18 DIAGNOSIS — M79642 Pain in left hand: Secondary | ICD-10-CM

## 2020-04-18 NOTE — Addendum Note (Signed)
Addended by: Lendon Collar on: 04/18/2020 10:47 AM   Modules accepted: Orders

## 2020-04-18 NOTE — Progress Notes (Signed)
Office Visit Note   Patient: Autumn Valenzuela           Date of Birth: 10/01/1960           MRN: [id number] Visit Date: 04/18/2020              Requested by: Melvenia Beam, [place],  [place] [address] PCP: Flossie Buffy, NP   Assessment & Plan: Visit Diagnoses:  1. Pain in left hand     Plan: Impression is left hand and wrist pain.  I suspect that she may have combination of carpal tunnel and cubital tunnel and possibly osteoarthritis as well.  I have recommended nerve conduction studies with Dr. Ernestina Valenzuela to assess for this.  Follow-up after the nerve conduction studies.  Follow-Up Instructions: Return if symptoms worsen or fail to improve.   Orders:  Orders Placed This Encounter  Procedures  . XR Hand Complete Left   No orders of the defined types were placed in this encounter.     Procedures: No procedures performed   Clinical Data: No additional findings.   Subjective: Chief Complaint  Patient presents with  . Left Hand - Pain    Autumn Valenzuela is a 60 year old female comes in for evaluation of chronic left hand and elbow pain for a little over a year.  She feels that the hand is tight and her fingers are popping.  She takes ibuprofen and gabapentin every day which helps only temporarily.  She feels numbness and tingling in all the fingertips.  She is right-hand dominant.  She is unemployed.  Denies any injuries to her left upper extremity.  Denies any radicular symptoms.   Review of Systems  Constitutional: Negative.   HENT: Negative.   Eyes: Negative.   Respiratory: Negative.   Cardiovascular: Negative.   Endocrine: Negative.   Musculoskeletal: Negative.   Neurological: Negative.   Hematological: Negative.   Psychiatric/Behavioral: Negative.   All other systems reviewed and are negative.    Objective: Vital Signs: There were no vitals taken for this visit.  Physical Exam Vitals and nursing note reviewed.   Constitutional:      Appearance: She is well-developed.  HENT:     Head: Normocephalic and atraumatic.  Pulmonary:     Effort: Pulmonary effort is normal.  Abdominal:     Palpations: Abdomen is soft.  Musculoskeletal:     Cervical back: Neck supple.  Skin:    General: Skin is warm.     Capillary Refill: Capillary refill takes less than 2 seconds.  Neurological:     Mental Status: She is alert and oriented to person, place, and time.  Psychiatric:        Thought Content: Thought content normal.        Judgment: Judgment normal.     Ortho Exam Left hand shows no atrophy.  She is able to make a full composite fist.  Negative carpal tunnel compressive signs.  Mildly positive Tinel's at the cubital tunnel.  She has subjective decreased sensation in all 5 fingers.  Negative Spurling's. Specialty Comments:  No specialty comments available.  Imaging: XR Hand Complete Left  Result Date: 04/18/2020 No acute or structural abnormalities    PMFS History: Patient Active Problem List   Diagnosis Date Noted  . Ulnar 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 of left upper extremity 08/09/2019  . Pincer nail deformity 05/19/2019  . Excessive daytime sleepiness 03/15/2019  . Type 2 diabetes mellitus with hyperglycemia, with long-term  current use of insulin ([place]) 03/08/2019  . Type 2 diabetes mellitus with diabetic polyneuropathy, with long-term current use of insulin ([place]) 03/08/2019  . Diabetic neuropathy with neurologic complication ([place]) 84/66/5993  . Claudication of both lower extremities ([place]) 02/04/2019  . RLS (restless legs syndrome) 02/04/2019  . Sleep deprivation 02/04/2019  . Hot flashes 02/04/2019  . Neck pain 02/04/2019  . Chronic migraine without aura, with intractable migraine, so stated, with status migrainosus 01/27/2019  . Acute pain of both knees 06/18/2018  . Radiculopathy 02/11/2018  . Bipolar disorder ([place]) 03/29/2016  . Diabetic ketoacidosis ([place]) 03/24/2016  . Dermatitis 03/24/2016   . HTN (hypertension) 03/24/2016  . HLD (hyperlipidemia) 03/24/2016  . DM (diabetes mellitus) ([place]) 03/24/2016  . Hyperglycemia   . Chronic daily headache 11/23/2012  . 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 Depression 11/23/2012   Past Medical History:  Diagnosis Date  . Allergy   . Anemia   . Anxiety   . Arthritis   . Back pain   . Bipolar disorder ([place])   . CVA (cerebral infarction)   . Depression   . Diabetes mellitus without complication ([place])   . High cholesterol   . Hypertension   . Left leg pain   . Migraine   . Sleep apnea   . Stroke [facility])     Family History  Problem Relation Age of Onset  . Diabetes Mother   . Stroke Father   . Cancer Paternal Aunt   . Cancer Paternal Aunt   . Diabetes Brother   . Colon cancer Neg Hx   . Esophageal cancer Neg Hx   . Rectal cancer Neg Hx   . Stomach cancer Neg Hx     Past Surgical History:  Procedure Laterality Date  . ABDOMINAL HYSTERECTOMY    . ANTERIOR LATERAL LUMBAR FUSION WITH PERCUTANEOUS SCREW 1 LEVEL Left 02/11/2018   Procedure: LEFT LATERAL LUMBAR  FOUR-FIVE INTERBODY FUSION WITH INSTRUMENTATION AND ALLOGRAFT;  Surgeon: Phylliss Bob, MD;  Location: [place];  Service: Orthopedics;  Laterality: Left;  LEFT LATERAL LUMBAR  FOUR-FIVE INTERBODY FUSION WITH INSTRUMENTATION AND ALLOGRAFT  . BACK SURGERY     Lumbar fusion L5-S1  . DIABETES    . EYE SURGERY Bilateral   . KNEE SURGERY    . l 4-l5 status post lateral posterior fusion January 30,2020    . NEUROFORAMINAL STENOSIS Left    Severe L4-L5, involving the level above previous fusion.  Marland Kitchen PARTIAL HYSTERECTOMY    . RADICULOPATHY Left    L4, Secondary to an L4-L5 Spondylolisthesis  . SHOULDER SURGERY     Social History   Occupational History  . Not on file  Tobacco Use  . Smoking status: Former Smoker    Quit date: 12/13/2015    Years since quitting: 4.3  . Smokeless tobacco: Never Used  Vaping Use  . Vaping Use: Never used   Substance and Sexual Activity  . Alcohol use: Yes    Comment: Occas  . Drug use: Not Currently    Types: Marijuana    Comment: last weed use 26 Dec 2018  . Sexual activity: Not Currently    Comment: 1st intercourse 60 yo-Fewer than 5 partners

## 2020-05-02 ENCOUNTER — Ambulatory Visit (INDEPENDENT_AMBULATORY_CARE_PROVIDER_SITE_OTHER): Payer: Medicare HMO | Admitting: Podiatry

## 2020-05-02 ENCOUNTER — Other Ambulatory Visit: Payer: Self-pay

## 2020-05-02 DIAGNOSIS — M722 Plantar fascial fibromatosis: Secondary | ICD-10-CM

## 2020-05-02 DIAGNOSIS — M2142 Flat foot [pes planus] (acquired), left foot: Secondary | ICD-10-CM

## 2020-05-02 DIAGNOSIS — E1140 Type 2 diabetes mellitus with diabetic neuropathy, unspecified: Secondary | ICD-10-CM

## 2020-05-02 DIAGNOSIS — E1149 Type 2 diabetes mellitus with other diabetic neurological complication: Secondary | ICD-10-CM

## 2020-05-02 DIAGNOSIS — M2141 Flat foot [pes planus] (acquired), right foot: Secondary | ICD-10-CM

## 2020-05-02 NOTE — Progress Notes (Signed)
Patient presented for foam casting for 3 pair custom diabetic shoe inserts. Patient is measured with a [place] Device to be a size 9 wide  Diabetic shoes are chosen from the [employer] Catalog.   The shoes chosen are [id number]  The patient will be contacted when the shoes and inserts are ready to be picked up

## 2020-05-15 ENCOUNTER — Other Ambulatory Visit: Payer: Self-pay | Admitting: Family Medicine

## 2020-05-15 DIAGNOSIS — G8929 Other chronic pain: Secondary | ICD-10-CM

## 2020-05-19 ENCOUNTER — Other Ambulatory Visit: Payer: Self-pay | Admitting: Nurse Practitioner

## 2020-05-19 DIAGNOSIS — M25511 Pain in right shoulder: Secondary | ICD-10-CM

## 2020-05-19 DIAGNOSIS — G8929 Other chronic pain: Secondary | ICD-10-CM

## 2020-05-19 MED ORDER — METFORMIN HCL ER 500 MG PO TB24: 500.0000 mg | ORAL_TABLET | Freq: Two times a day (BID) | ORAL | 0 refills | Status: DC

## 2020-05-19 NOTE — Telephone Encounter (Signed)
Pt called to f/u on metformin refill. RX was sent to Dr. Damita Dunnings in error. Pt has 5 days left. 90 day supply requested.  Pt also asking for Tramadol - pt is out. Dr. Damita Dunnings would not refill at last request.  [facility] [place], [address] AT [address] Phone:  [phone]  Fax:  [phone]

## 2020-05-19 NOTE — Addendum Note (Signed)
Addended by: Renette Butters on: 05/19/2020 05:00 PM   Modules accepted: Orders

## 2020-05-19 NOTE — Telephone Encounter (Signed)
Please advise 

## 2020-05-23 ENCOUNTER — Other Ambulatory Visit: Payer: Self-pay

## 2020-05-23 ENCOUNTER — Ambulatory Visit (INDEPENDENT_AMBULATORY_CARE_PROVIDER_SITE_OTHER): Payer: Medicare HMO | Admitting: Podiatry

## 2020-05-23 DIAGNOSIS — M2142 Flat foot [pes planus] (acquired), left foot: Secondary | ICD-10-CM | POA: Diagnosis not present

## 2020-05-23 DIAGNOSIS — M2141 Flat foot [pes planus] (acquired), right foot: Secondary | ICD-10-CM

## 2020-05-23 DIAGNOSIS — E1149 Type 2 diabetes mellitus with other diabetic neurological complication: Secondary | ICD-10-CM

## 2020-05-23 DIAGNOSIS — E1165 Type 2 diabetes mellitus with hyperglycemia: Secondary | ICD-10-CM | POA: Diagnosis not present

## 2020-05-23 DIAGNOSIS — E1140 Type 2 diabetes mellitus with diabetic neuropathy, unspecified: Secondary | ICD-10-CM

## 2020-05-23 NOTE — Progress Notes (Signed)
The patient presented to the office today to pick up diabetic shoes and 3 pair diabetic custom inserts.  1 pair of inserts were put in the shoes and the shoes were fitted to the patient. The patient states they are comfortable and free of defect. She was satisfied with the fit of the shoe. Instructions for break in and wear were dispensed. The patient signed the delivery documentation and break in instruction form  If any concerns or questions arise, she is instructed to call 

## 2020-05-27 ENCOUNTER — Emergency Department (HOSPITAL_COMMUNITY)
Admission: EM | Admit: 2020-05-27 | Discharge: 2020-05-27 | Disposition: A | Discharge status: Home / Self Care | Payer: Medicare HMO | Attending: Emergency Medicine | Admitting: Emergency Medicine

## 2020-05-27 ENCOUNTER — Other Ambulatory Visit: Payer: Self-pay

## 2020-05-27 ENCOUNTER — Emergency Department (HOSPITAL_COMMUNITY): Payer: Medicare HMO

## 2020-05-27 DIAGNOSIS — Z87891 Personal history of nicotine dependence: Secondary | ICD-10-CM | POA: Diagnosis not present

## 2020-05-27 DIAGNOSIS — E1140 Type 2 diabetes mellitus with diabetic neuropathy, unspecified: Secondary | ICD-10-CM | POA: Diagnosis not present

## 2020-05-27 DIAGNOSIS — M7918 Myalgia, other site: Secondary | ICD-10-CM | POA: Diagnosis not present

## 2020-05-27 DIAGNOSIS — Z79899 Other long term (current) drug therapy: Secondary | ICD-10-CM | POA: Insufficient documentation

## 2020-05-27 DIAGNOSIS — R519 Headache, unspecified: Secondary | ICD-10-CM | POA: Diagnosis not present

## 2020-05-27 DIAGNOSIS — I10 Essential (primary) hypertension: Secondary | ICD-10-CM | POA: Diagnosis not present

## 2020-05-27 DIAGNOSIS — Z794 Long term (current) use of insulin: Secondary | ICD-10-CM | POA: Insufficient documentation

## 2020-05-27 DIAGNOSIS — M791 Myalgia, unspecified site: Secondary | ICD-10-CM

## 2020-05-27 DIAGNOSIS — E1110 Type 2 diabetes mellitus with ketoacidosis without coma: Secondary | ICD-10-CM | POA: Insufficient documentation

## 2020-05-27 LAB — COMPREHENSIVE METABOLIC PANEL
ALT: 9 U/L (ref 0–44)
AST: 11 U/L — ABNORMAL LOW (ref 15–41)
Albumin: 3.6 g/dL (ref 3.5–5.0)
Alkaline Phosphatase: 60 U/L (ref 38–126)
Anion gap: 8 (ref 5–15)
BUN: 13 mg/dL (ref 6–20)
CO2: 23 mmol/L (ref 22–32)
Calcium: 9.3 mg/dL (ref 8.9–10.3)
Chloride: 109 mmol/L (ref 98–111)
Creatinine, Ser: 0.75 mg/dL (ref 0.44–1.00)
GFR, Estimated: >60 mL/min (ref >60)
Glucose, Bld: 164 mg/dL — ABNORMAL HIGH (ref 70–99)
Potassium: 4.7 mmol/L (ref 3.5–5.1)
Sodium: 140 mmol/L (ref 135–145)
Total Bilirubin: 0.4 mg/dL (ref 0.3–1.2)
Total Protein: 7 g/dL (ref 6.5–8.1)

## 2020-05-27 LAB — CBC WITH DIFFERENTIAL/PLATELET
Abs Immature Granulocytes: 0.06 10*3/uL (ref 0.00–0.07)
Basophils Absolute: 0 10*3/uL (ref 0.0–0.1)
Basophils Relative: 0 %
Eosinophils Absolute: 0.4 10*3/uL (ref 0.0–0.5)
Eosinophils Relative: 3 %
HCT: 41.2 % (ref 36.0–46.0)
Hemoglobin: 13.1 g/dL (ref 12.0–15.0)
Immature Granulocytes: 1 %
Lymphocytes Relative: 23 %
Lymphs Abs: 2.7 10*3/uL (ref 0.7–4.0)
MCH: 28.7 pg (ref 26.0–34.0)
MCHC: 31.8 g/dL (ref 30.0–36.0)
MCV: 90.4 fL (ref 80.0–100.0)
Monocytes Absolute: 0.7 10*3/uL (ref 0.1–1.0)
Monocytes Relative: 6 %
Neutro Abs: 8 10*3/uL — ABNORMAL HIGH (ref 1.7–7.7)
Neutrophils Relative %: 67 %
Platelets: 199 10*3/uL (ref 150–400)
RBC: 4.56 MIL/uL (ref 3.87–5.11)
RDW: 15.2 % (ref 11.5–15.5)
WBC: 11.9 10*3/uL — ABNORMAL HIGH (ref 4.0–10.5)
nRBC: 0 % (ref 0.0–0.2)

## 2020-05-27 LAB — URINALYSIS, ROUTINE W REFLEX MICROSCOPIC
Bacteria, UA: NONE SEEN
Bilirubin Urine: NEGATIVE
Glucose, UA: >=500 mg/dL — AB
Hgb urine dipstick: NEGATIVE
Ketones, ur: NEGATIVE mg/dL
Leukocytes,Ua: NEGATIVE
Nitrite: NEGATIVE
Protein, ur: NEGATIVE mg/dL
Specific Gravity, Urine: 1.028 (ref 1.005–1.030)
pH: 5 (ref 5.0–8.0)

## 2020-05-27 MED ORDER — HYDROCODONE-ACETAMINOPHEN 5-325 MG PO TABS: 1.0000 | ORAL_TABLET | ORAL | 0 refills | Status: DC | PRN

## 2020-05-27 NOTE — ED Notes (Addendum)
Pt states was having numbness in right fingers yesterday w/HA, but it has gone away. Pt denies N/V with HA, but states has dizziness, light headedness, seeing spots in eyes intermittently started today.
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
## 2020-05-27 NOTE — ED Notes (Signed)
Patient transported to CT 

## 2020-05-27 NOTE — Discharge Instructions (Signed)
Return if any problems.
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
## 2020-05-30 ENCOUNTER — Ambulatory Visit
Admission: RE | Admit: 2020-05-30 | Discharge: 2020-05-30 | Disposition: A | Discharge status: Home / Self Care | Payer: Medicare HMO | Source: Ambulatory Visit | Attending: Nurse Practitioner | Admitting: Nurse Practitioner

## 2020-05-30 ENCOUNTER — Other Ambulatory Visit: Payer: Self-pay

## 2020-05-30 DIAGNOSIS — Z1231 Encounter for screening mammogram for malignant neoplasm of breast: Secondary | ICD-10-CM | POA: Diagnosis not present

## 2020-05-31 ENCOUNTER — Ambulatory Visit (INDEPENDENT_AMBULATORY_CARE_PROVIDER_SITE_OTHER): Payer: Medicare HMO | Admitting: Physical Medicine and Rehabilitation

## 2020-05-31 ENCOUNTER — Encounter: Payer: Self-pay | Admitting: Physical Medicine and Rehabilitation

## 2020-05-31 DIAGNOSIS — R202 Paresthesia of skin: Secondary | ICD-10-CM | POA: Diagnosis not present

## 2020-05-31 NOTE — Procedures (Signed)
EMG & NCV Findings: Evaluation of the left ulnar motor nerve showed decreased conduction velocity (B Elbow-Wrist, 50 m/s) and decreased conduction velocity (A Elbow-B Elbow, 37 m/s).  The left median (across palm) sensory nerve showed prolonged distal peak latency (Wrist, 4.0 ms) and prolonged distal peak latency (Palm, 2.1 ms).  The left ulnar sensory nerve showed prolonged distal peak latency (3.9 ms) and decreased conduction velocity (Wrist-5th Digit, 36 m/s).  All remaining nerves (as indicated in the following tables) were within normal limits.    All examined muscles (as indicated in the following table) showed no evidence of electrical instability.    Impression: The above electrodiagnostic study is ABNORMAL and reveals evidence of:  1. 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  This would fit with her symptoms more of some numbness in the middle digit.  However this is a fairly mild finding with borderline slowing of the motor nerve consistent with median nerve neuropathy but could also be some underlying temperature artifact or underlying peripheral polyneuropathy.  2. 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  Essentially the same finding that Dr. Sarina Ill had on her prior study last year.   There is no significant electrodiagnostic evidence of any other focal nerve entrapment or cervical radiculopathy.    Recommendations: 1.  Follow-up with referring physician. 2.  Continue current management of symptoms.  ___________________________ Laurence Spates FAAPMR Board Certified, American Board of Physical Medicine and Rehabilitation    Nerve Conduction Studies Anti Sensory Summary Table   Stim Site NR Peak (ms) Norm Peak (ms) P-T Amp (V) Norm P-T Amp Site1 Site2 Delta-P (ms) Dist (cm) Vel (m/s) Norm Vel (m/s)  Left Median Acr Palm Anti  Sensory (2nd Digit)  30.6C  Wrist    *4.0 <3.6 30.7 >10 Wrist Palm 1.9 0.0    Palm    *2.1 <2.0 20.4         Left Radial Anti Sensory (Base 1st Digit)  30C  Wrist    2.4 <3.1 34.1  Wrist Base 1st Digit 2.4 0.0    Left Ulnar Anti Sensory (5th Digit)  30.6C  Wrist    *3.9 <3.7 28.3 >15.0 Wrist 5th Digit 3.9 14.0 *36 >38   Motor Summary Table   Stim Site NR Onset (ms) Norm Onset (ms) O-P Amp (mV) Norm O-P Amp Site1 Site2 Delta-0 (ms) Dist (cm) Vel (m/s) Norm Vel (m/s)  Left Median Motor (Abd Poll Brev)  29.9C  Wrist    4.2 <4.2 5.9 >5 Elbow Wrist 4.2 21.0 50 >50  Elbow    8.4  5.6         Left Ulnar Motor (Abd Dig Min)  30.1C  Wrist    3.8 <4.2 7.6 >3 B Elbow Wrist 3.7 18.5 *50 >53  B Elbow    7.5  6.5  A Elbow B Elbow 2.7 10.0 *37 >53  A Elbow    10.2  6.2          EMG   Side Muscle Nerve Root Ins Act Fibs Psw Amp Dur Poly Recrt Int Fraser Din Comment  Left Abd Poll Brev Median C8-T1 Nml Nml Nml Nml Nml 0 Nml Nml   Left 1stDorInt Ulnar C8-T1 Nml Nml Nml Nml Nml 0 Nml Nml   Left PronatorTeres Median C6-7 Nml Nml Nml Nml Nml 0 Nml Nml     Nerve Conduction Studies Anti  Sensory Left/Right Comparison   Stim Site L Lat (ms) R Lat (ms) L-R Lat (ms) L Amp (V) R Amp (V) L-R Amp (%) Site1 Site2 L Vel (m/s) R Vel (m/s) L-R Vel (m/s)  Median Acr Palm Anti Sensory (2nd Digit)  30.6C  Wrist *4.0   30.7   Wrist Palm     Palm *2.1   20.4         Radial Anti Sensory (Base 1st Digit)  30C  Wrist 2.4   34.1   Wrist Base 1st Digit     Ulnar Anti Sensory (5th Digit)  30.6C  Wrist *3.9   28.3   Wrist 5th Digit *36     Motor Left/Right Comparison   Stim Site L Lat (ms) R Lat (ms) L-R Lat (ms) L Amp (mV) R Amp (mV) L-R Amp (%) Site1 Site2 L Vel (m/s) R Vel (m/s) L-R Vel (m/s)  Median Motor (Abd Poll Brev)  29.9C  Wrist 4.2   5.9   Elbow Wrist 50    Elbow 8.4   5.6         Ulnar Motor (Abd Dig Min)  30.1C  Wrist 3.8   7.6   B Elbow Wrist *50    B Elbow 7.5   6.5   A Elbow B Elbow *37    A  Elbow 10.2   6.2            Waveforms:

## 2020-05-31 NOTE — Progress Notes (Signed)
Autumn Valenzuela - 60 y.o. female MRN [id number]  Date of birth: 10-28-1960  Office Visit Note: Visit Date: 05/31/2020 PCP: Flossie Buffy, NP Referred by: Flossie Buffy, NP  Subjective: Chief Complaint  Patient presents with  . Left Wrist - Pain  . Left Hand - Pain   HPI:  Autumn Valenzuela is a 60 y.o. female who comes in today at the request of Dr. Eduard Roux for electrodiagnostic study of the Left upper extremities.  Patient is Right hand dominant.  She describes 10 out of 10 pain in the left hand particularly with numbness into the middle finger and a lot of pain in the wrist.  She reports that she has a hard time lifting anything and holding items and making a fist that seems like her hand really hurts when that happens.  She sometimes will feel a mechanical pop in her wrist.  She does report middle finger locking or trigger finger.  She has been under the care of Dr. 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  That specific study did not indicate any median nerve neuropathy.  She reports to me that she is undergoing some work-up for her feet and legs for neuropathy.  She carries a diagnosis of polyneuropathy.  She is insulin-dependent diabetic.  Last hemoglobin A1c was a little over 8 but over the last couple years it was as high as 14.  He denies any frank radicular symptoms.  ROS Otherwise per HPI.  Assessment & Plan: Visit Diagnoses:    ICD-10-CM   1. Paresthesia of skin  R20.2 NCV with EMG (electromyography)    Plan: Impression: The above electrodiagnostic study is ABNORMAL and reveals evidence of:  1. 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  This would fit with her symptoms more of some numbness in the middle digit.  However this is a fairly mild finding with borderline slowing of the motor nerve consistent  with median nerve neuropathy but could also be some underlying temperature artifact or underlying peripheral polyneuropathy.  2. 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  Essentially the same finding that Dr. Sarina Ill had on her prior study last year.   There is no significant electrodiagnostic evidence of any other focal nerve entrapment or cervical radiculopathy.    Recommendations: 1.  Follow-up with referring physician. 2.  Continue current management of symptoms.  Meds & Orders: No orders of the defined types were placed in this encounter.   Orders Placed This Encounter  Procedures  . NCV with EMG (electromyography)    Follow-up: Return in about 2 weeks (around 06/14/2020) for  Eduard Roux, MD.   Procedures: No procedures performed  EMG & NCV Findings: Evaluation of the left ulnar motor nerve showed decreased conduction velocity (B Elbow-Wrist, 50 m/s) and decreased conduction velocity (A Elbow-B Elbow, 37 m/s).  The left median (across palm) sensory nerve showed prolonged distal peak latency (Wrist, 4.0 ms) and prolonged distal peak latency (Palm, 2.1 ms).  The left ulnar sensory nerve showed prolonged distal peak latency (3.9 ms) and decreased conduction velocity (Wrist-5th Digit, 36 m/s).  All remaining nerves (as indicated in the following tables) were within normal limits.    All examined muscles (as indicated in the following table) showed no evidence of electrical instability.  Impression: The above electrodiagnostic study is ABNORMAL and reveals evidence of:  1. 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  This would fit with her symptoms more of some numbness in the middle digit.  However this is a fairly mild finding with borderline slowing of the motor nerve consistent with median nerve neuropathy but could also be some  underlying temperature artifact or underlying peripheral polyneuropathy.  2. 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  Essentially the same finding that Dr. Sarina Ill had on her prior study last year.   There is no significant electrodiagnostic evidence of any other focal nerve entrapment or cervical radiculopathy.    Recommendations: 1.  Follow-up with referring physician. 2.  Continue current management of symptoms.  ___________________________ Laurence Spates FAAPMR Board Certified, American Board of Physical Medicine and Rehabilitation    Nerve Conduction Studies Anti Sensory Summary Table   Stim Site NR Peak (ms) Norm Peak (ms) P-T Amp (V) Norm P-T Amp Site1 Site2 Delta-P (ms) Dist (cm) Vel (m/s) Norm Vel (m/s)  Left Median Acr Palm Anti Sensory (2nd Digit)  30.6C  Wrist    *4.0 <3.6 30.7 >10 Wrist Palm 1.9 0.0    Palm    *2.1 <2.0 20.4         Left Radial Anti Sensory (Base 1st Digit)  30C  Wrist    2.4 <3.1 34.1  Wrist Base 1st Digit 2.4 0.0    Left Ulnar Anti Sensory (5th Digit)  30.6C  Wrist    *3.9 <3.7 28.3 >15.0 Wrist 5th Digit 3.9 14.0 *36 >38   Motor Summary Table   Stim Site NR Onset (ms) Norm Onset (ms) O-P Amp (mV) Norm O-P Amp Site1 Site2 Delta-0 (ms) Dist (cm) Vel (m/s) Norm Vel (m/s)  Left Median Motor (Abd Poll Brev)  29.9C  Wrist    4.2 <4.2 5.9 >5 Elbow Wrist 4.2 21.0 50 >50  Elbow    8.4  5.6         Left Ulnar Motor (Abd Dig Min)  30.1C  Wrist    3.8 <4.2 7.6 >3 B Elbow Wrist 3.7 18.5 *50 >53  B Elbow    7.5  6.5  A Elbow B Elbow 2.7 10.0 *37 >53  A Elbow    10.2  6.2          EMG   Side Muscle Nerve Root Ins Act Fibs Psw Amp Dur Poly Recrt Int Fraser Din Comment  Left Abd Poll Brev Median C8-T1 Nml Nml Nml Nml Nml 0 Nml Nml   Left 1stDorInt Ulnar C8-T1 Nml Nml Nml Nml Nml 0 Nml Nml   Left PronatorTeres Median C6-7 Nml Nml Nml Nml Nml 0 Nml Nml     Nerve  Conduction Studies Anti Sensory Left/Right Comparison   Stim Site L Lat (ms) R Lat (ms) L-R Lat (ms) L Amp (V) R Amp (V) L-R Amp (%) Site1 Site2 L Vel (m/s) R Vel (m/s) L-R Vel (m/s)  Median Acr Palm Anti Sensory (2nd Digit)  30.6C  Wrist *4.0   30.7   Wrist Palm     Palm *2.1   20.4         Radial Anti Sensory (Base 1st Digit)  30C  Wrist 2.4   34.1   Wrist Base 1st Digit     Ulnar Anti Sensory (5th Digit)  30.6C  Wrist *3.9  28.3   Wrist 5th Digit *36     Motor Left/Right Comparison   Stim Site L Lat (ms) R Lat (ms) L-R Lat (ms) L Amp (mV) R Amp (mV) L-R Amp (%) Site1 Site2 L Vel (m/s) R Vel (m/s) L-R Vel (m/s)  Median Motor (Abd Poll Brev)  29.9C  Wrist 4.2   5.9   Elbow Wrist 50    Elbow 8.4   5.6         Ulnar Motor (Abd Dig Min)  30.1C  Wrist 3.8   7.6   B Elbow Wrist *50    B Elbow 7.5   6.5   A Elbow B Elbow *37    A Elbow 10.2   6.2            Waveforms:             Clinical History: 08/09/2019 Electrodiagnostic test History: Numbness and tingling in the fingers, left > right. Left arm hurts at the elbow with radiation down to the fingers.     Summary: The left Ulnar motor nerve showed a 75m/s drop(N<10) in conduction velocity across the left elbow. All remaining nerves (as indicated in the following tables) were within normal limits.  All muscles (as indicated in the following tables) were within normal limits.       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  No suggestion of polyneuropathy or cervical radiculopathy.   Sarina Ill M.D.  [facility]     Objective:  VS:  HT:    WT:   BMI:     BP:   HR: bpm  TEMP: ( )  RESP:  Physical Exam Musculoskeletal:        General: No swelling, tenderness or deformity.     Comments: Inspection reveals mild atrophy of the bilateral APB or FDI or hand intrinsics. There is no swelling, color changes, allodynia or  dystrophic changes. There is 5 out of 5 strength in the bilateral wrist extension, finger abduction and long finger flexion. There is intact sensation to light touch in all dermatomal and peripheral nerve distributions.  There is a negative Hoffmann's test bilaterally.  Skin:    General: Skin is warm and dry.     Findings: No erythema or rash.  Neurological:     General: No focal deficit present.     Mental Status: She is alert and oriented to person, place, and time.     Motor: No weakness or abnormal muscle tone.     Coordination: Coordination normal.  Psychiatric:        Mood and Affect: Mood normal.        Behavior: Behavior normal.      Imaging: No results found.

## 2020-05-31 NOTE — Progress Notes (Signed)
Pt state pain in her left hand, middle finger and wrist. Pt state she has hard time lifting and hold items and makes a fist. Pt state when she use her hand she feels a pop in her wrist. Pt state her middle finger get stuck sometimes. Pt state she take over the counter meds to help ease her pain. Pt state she is right handed.  Numeric Pain Rating Scale and Functional Assessment Average Pain 10   In the last MONTH (on 0-10 scale) has pain interfered with the following?  1. General activity like being  able to carry out your everyday physical activities such as walking, climbing stairs, carrying groceries, or moving a chair?  Rating(10)   +Driver, -BT, -Dye Allergies.

## 2020-06-06 NOTE — Telephone Encounter (Signed)
Pt transferred to [facility]

## 2020-06-13 ENCOUNTER — Encounter: Payer: Self-pay | Admitting: Podiatry

## 2020-06-13 ENCOUNTER — Other Ambulatory Visit: Payer: Self-pay

## 2020-06-13 ENCOUNTER — Ambulatory Visit (INDEPENDENT_AMBULATORY_CARE_PROVIDER_SITE_OTHER): Payer: Medicare HMO | Admitting: Podiatry

## 2020-06-13 DIAGNOSIS — M76821 Posterior tibial tendinitis, right leg: Secondary | ICD-10-CM

## 2020-06-13 DIAGNOSIS — M76822 Posterior tibial tendinitis, left leg: Secondary | ICD-10-CM | POA: Diagnosis not present

## 2020-06-13 NOTE — Progress Notes (Signed)
  Subjective:  Patient ID: Autumn Valenzuela, female    DOB: 1960/01/26,  MRN: [id number]  Chief Complaint  Patient presents with  . Peripheral Neuropathy    Bilateral foot pain - doing much better    60 y.o. female returns for follow-up with the above complaint. History confirmed with patient.  The right side is doing much better and the left side is hurting more than the right side ever did  Objective:  Physical Exam: warm, good capillary refill, no trophic changes or ulcerative lesions, normal DP and PT pulses and normal sensory exam. Left Foot: She has semi-reducible hammertoe contractures, pes planus, she has pain on palpation of the posterior tibial tendon along its course and at the insertion of the navicular, pain with resisted inversion Right Foot: Pes planus deformity, no pain on palpation today.  She is unable to do a single heel rise.   Radiographs: X-ray of both feet: no fracture, dislocation, swelling or degenerative changes noted and pes planus Assessment:   1. Posterior tibial tendinitis of left leg   2. Posterior tibial tendon dysfunction (PTTD) of right lower extremity      Plan:  Patient was evaluated and treated and all questions answered.  Discussed the etiology and treatment options for posterior tibial tendinitis including stretching, formal physical therapy, supportive shoegears such as a running shoe or sneaker, pre fabricated orthoses, injection therapy, and oral medications. We also discussed the role of surgical treatment of this for patients who do not improve after exhausting non-surgical treatment options.  So far has improved on the right side but now is having significant pain in the left side as well.  I think she should begin physical therapy which I sent a referral to [facility] for.  Also dispensed Tri-Lock ankle brace for support  Return in about 6 weeks (around 07/25/2020) for re-check posterior tibial tendinitis.

## 2020-06-14 DIAGNOSIS — H04123 Dry eye syndrome of bilateral lacrimal glands: Secondary | ICD-10-CM | POA: Diagnosis not present

## 2020-06-14 DIAGNOSIS — H10413 Chronic giant papillary conjunctivitis, bilateral: Secondary | ICD-10-CM | POA: Diagnosis not present

## 2020-06-14 DIAGNOSIS — H40013 Open angle with borderline findings, low risk, bilateral: Secondary | ICD-10-CM | POA: Diagnosis not present

## 2020-06-14 DIAGNOSIS — Z961 Presence of intraocular lens: Secondary | ICD-10-CM | POA: Diagnosis not present

## 2020-06-14 DIAGNOSIS — E119 Type 2 diabetes mellitus without complications: Secondary | ICD-10-CM | POA: Diagnosis not present

## 2020-06-14 DIAGNOSIS — H26491 Other secondary cataract, right eye: Secondary | ICD-10-CM | POA: Diagnosis not present

## 2020-06-14 LAB — HM DIABETES EYE EXAM

## 2020-06-19 ENCOUNTER — Encounter: Payer: Self-pay | Admitting: Nurse Practitioner

## 2020-06-19 ENCOUNTER — Telehealth: Payer: Self-pay

## 2020-06-19 NOTE — Telephone Encounter (Signed)
Pt c/o lt knee pain and would like a referral.  Pt is scheduled to come in on Thursday at 2:15

## 2020-06-21 ENCOUNTER — Ambulatory Visit (INDEPENDENT_AMBULATORY_CARE_PROVIDER_SITE_OTHER): Payer: Medicare HMO | Admitting: Orthopaedic Surgery

## 2020-06-21 ENCOUNTER — Other Ambulatory Visit: Payer: Self-pay

## 2020-06-21 ENCOUNTER — Encounter: Payer: Self-pay | Admitting: Orthopaedic Surgery

## 2020-06-21 DIAGNOSIS — G5622 Lesion of ulnar nerve, left upper limb: Secondary | ICD-10-CM

## 2020-06-21 DIAGNOSIS — G5602 Carpal tunnel syndrome, left upper limb: Secondary | ICD-10-CM

## 2020-06-21 DIAGNOSIS — E1142 Type 2 diabetes mellitus with diabetic polyneuropathy: Secondary | ICD-10-CM | POA: Diagnosis not present

## 2020-06-21 NOTE — Progress Notes (Signed)
Office Visit Note   Patient: Autumn Valenzuela           Date of Birth: 06/03/1960           MRN: [id number] Visit Date: 06/21/2020              Requested by: Flossie Buffy, NP [place],  [place] [id number] PCP: Flossie Buffy, NP   Assessment & Plan: Visit Diagnoses:  1. Cubital tunnel syndrome on left   2. Carpal tunnel syndrome, left upper limb     Plan: Recent nerve conduction study left upper extremity shows mild compression of the median nerve in addition to mild to moderate compression of the ulnar nerve at the elbow consistent with left carpal tunnel and cubital tunnel syndrome.  I have discussed these results thoroughly with the patient.  She has tried wrist and elbow splints in addition to gabapentin without relief in symptoms.  She has not previously had cortisone injections to the left wrist.  I recommended surgical intervention to the left elbow to include ulnar nerve decompression for which she would like to proceed.  We have also discussed left carpal tunnel injection at the time of ulnar nerve decompression.  She would like to proceed with this as well.  Risk, benefits and possible complications reviewed.  Rehab recovery time discussed.  All questions were answered.  The patient is a type II diabetic and we will need to repeat her hemoglobin A1c today.  Should this be below 7.8, we will call her to further discuss and schedule surgery.  If A1c is too high then this will need to be repeated in about 2 to 3 months.  Follow-Up Instructions: Return for post-op if proceeding with sx.   Orders:  No orders of the defined types were placed in this encounter.  No orders of the defined types were placed in this encounter.     Procedures: No procedures performed   Clinical Data: No additional findings.   Subjective: Chief Complaint  Patient presents with  . Left Hand - Pain, Follow-up    HPI patient is a pleasant 60 year old female who  comes in today to discuss nerve conduction study/EMG left upper extremity.  She has been complaining of left hand and elbow pain and paresthesias for over a year.  Symptoms are worse at night as well as when she is making biscuits.  She complains only of paresthesias but stiffness to the entire hand which causes her hand to cramp into a clenched fist.  She has been taking gabapentin and using wrist and elbow braces without relief.  Recent nerve conduction study left upper extremity shows mild compression of the median nerve in addition to mild to moderate compression of the ulnar nerve at the elbow.  Review of Systems as detailed in HPI.  All others reviewed and are negative.   Objective: Vital Signs: There were no vitals taken for this visit.  Physical Exam well-developed well-nourished female in no acute distress.  Alert and oriented x3.  Ortho Exam unchanged left upper extremity exam  Specialty Comments:  No specialty comments available.  Imaging: No new imaging   PMFS History: Patient Active Problem List   Diagnosis Date Noted  . Ulnar 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 of left upper extremity 08/09/2019  . Pincer nail deformity 05/19/2019  . Excessive daytime sleepiness 03/15/2019  . Type 2 diabetes mellitus with hyperglycemia, with long-term current use of insulin ([place]) 03/08/2019  . Type 2 diabetes mellitus  with diabetic polyneuropathy, with long-term current use of insulin ([place]) 03/08/2019  . Diabetic neuropathy with neurologic complication ([place]) 32/99/2426  . Claudication of both lower extremities ([place]) 02/04/2019  . RLS (restless legs syndrome) 02/04/2019  . Sleep deprivation 02/04/2019  . Hot flashes 02/04/2019  . Neck pain 02/04/2019  . Chronic migraine without aura, with intractable migraine, so stated, with status migrainosus 01/27/2019  . Acute pain of both knees 06/18/2018  . Radiculopathy 02/11/2018  . Bipolar disorder ([place]) 03/29/2016  . Diabetic ketoacidosis ([place]) 03/24/2016   . Dermatitis 03/24/2016  . HTN (hypertension) 03/24/2016  . HLD (hyperlipidemia) 03/24/2016  . DM (diabetes mellitus) ([place]) 03/24/2016  . Hyperglycemia   . Chronic daily headache 11/23/2012  . 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 Depression 11/23/2012   Past Medical History:  Diagnosis Date  . Allergy   . Anemia   . Anxiety   . Arthritis   . Back pain   . Bipolar disorder ([place])   . CVA (cerebral infarction)   . Depression   . Diabetes mellitus without complication ([place])   . High cholesterol   . Hypertension   . Left leg pain   . Migraine   . Sleep apnea   . Stroke [facility])     Family History  Problem Relation Age of Onset  . Diabetes Mother   . Stroke Father   . Cancer Paternal Aunt   . Cancer Paternal Aunt   . Diabetes Brother   . Colon cancer Neg Hx   . Esophageal cancer Neg Hx   . Rectal cancer Neg Hx   . Stomach cancer Neg Hx     Past Surgical History:  Procedure Laterality Date  . ABDOMINAL HYSTERECTOMY    . ANTERIOR LATERAL LUMBAR FUSION WITH PERCUTANEOUS SCREW 1 LEVEL Left 02/11/2018   Procedure: LEFT LATERAL LUMBAR  FOUR-FIVE INTERBODY FUSION WITH INSTRUMENTATION AND ALLOGRAFT;  Surgeon: Phylliss Bob, MD;  Location: [place];  Service: Orthopedics;  Laterality: Left;  LEFT LATERAL LUMBAR  FOUR-FIVE INTERBODY FUSION WITH INSTRUMENTATION AND ALLOGRAFT  . BACK SURGERY     Lumbar fusion L5-S1  . DIABETES    . EYE SURGERY Bilateral   . KNEE SURGERY    . l 4-l5 status post lateral posterior fusion January 30,2020    . NEUROFORAMINAL STENOSIS Left    Severe L4-L5, involving the level above previous fusion.  Marland Kitchen PARTIAL HYSTERECTOMY    . RADICULOPATHY Left    L4, Secondary to an L4-L5 Spondylolisthesis  . SHOULDER SURGERY     Social History   Occupational History  . Not on file  Tobacco Use  . Smoking status: Former Smoker    Quit date: 12/13/2015    Years since quitting: 4.5  . Smokeless tobacco: Never Used  Vaping Use  .  Vaping Use: Never used  Substance and Sexual Activity  . Alcohol use: Yes    Comment: Occas  . Drug use: Not Currently    Types: Marijuana    Comment: last weed use 26 Dec 2018  . Sexual activity: Not Currently    Comment: 1st intercourse 60 yo-Fewer than 5 partners

## 2020-06-22 LAB — HEMOGLOBIN A1C
Hgb A1c MFr Bld: 8.6 % of total Hgb — ABNORMAL HIGH (ref <5.7)
Mean Plasma Glucose: 200 mg/dL
eAG (mmol/L): 11.1 mmol/L

## 2020-06-22 LAB — EXTRA SPECIMEN

## 2020-06-22 NOTE — Progress Notes (Signed)
Called patient; aware. 

## 2020-06-22 NOTE — Progress Notes (Signed)
Ok, thanks.

## 2020-06-22 NOTE — Progress Notes (Signed)
A1c too high for surgery.  Please let patient know to see PCP to get diabetes under better control.  Recheck in 2 months.  Thanks.

## 2020-06-27 ENCOUNTER — Other Ambulatory Visit: Payer: Self-pay

## 2020-06-27 ENCOUNTER — Ambulatory Visit: Payer: Medicare HMO | Attending: Podiatry

## 2020-06-27 DIAGNOSIS — M76822 Posterior tibial tendinitis, left leg: Secondary | ICD-10-CM

## 2020-06-27 DIAGNOSIS — M5442 Lumbago with sciatica, left side: Secondary | ICD-10-CM | POA: Insufficient documentation

## 2020-06-27 DIAGNOSIS — G8929 Other chronic pain: Secondary | ICD-10-CM | POA: Diagnosis not present

## 2020-06-27 DIAGNOSIS — M6281 Muscle weakness (generalized): Secondary | ICD-10-CM

## 2020-06-27 DIAGNOSIS — M546 Pain in thoracic spine: Secondary | ICD-10-CM | POA: Insufficient documentation

## 2020-06-27 DIAGNOSIS — M25572 Pain in left ankle and joints of left foot: Secondary | ICD-10-CM | POA: Diagnosis not present

## 2020-06-27 DIAGNOSIS — M5441 Lumbago with sciatica, right side: Secondary | ICD-10-CM | POA: Insufficient documentation

## 2020-06-27 DIAGNOSIS — R262 Difficulty in walking, not elsewhere classified: Secondary | ICD-10-CM | POA: Diagnosis not present

## 2020-06-28 ENCOUNTER — Other Ambulatory Visit: Payer: Self-pay

## 2020-06-28 ENCOUNTER — Telehealth: Payer: Self-pay | Admitting: Nurse Practitioner

## 2020-06-28 NOTE — Telephone Encounter (Signed)
ERROR

## 2020-06-28 NOTE — Therapy (Signed)
[place], [place], [address] Phone: [phone]   Fax:  [phone]  Physical Therapy Evaluation  Patient Details  Name: Autumn Valenzuela MRN: [id number] Date of Birth: 1960/02/16 Referring Provider (PT): [name], DPM``````````````````````````````````````````````````````````````````````````````````````````````````   Encounter Date: 06/27/2020   PT End of Session - 06/28/20 2218     Visit Number 1    Number of Visits 13    Date for PT Re-Evaluation 08/19/20    Authorization Type [employer] MEDICARE HMO/PPO    Progress Note Due on Visit 10    PT Start Time 0800    PT Stop Time 0848    PT Time Calculation (min) 48 min    Equipment Utilized During Treatment Other (comment)   L ankle brace and shoe orthotics   Activity Tolerance Patient tolerated treatment well    Behavior During Therapy WFL for tasks assessed/performed             Past Medical History:  Diagnosis Date   Allergy    Anemia    Anxiety    Arthritis    Back pain    Bipolar disorder (HCC)    CVA (cerebral infarction)    Depression    Diabetes mellitus without complication (HCC)    High cholesterol    Hypertension    Left leg pain    Migraine    Sleep apnea    Stroke [facility])     Past Surgical History:  Procedure Laterality Date   ABDOMINAL HYSTERECTOMY     ANTERIOR LATERAL LUMBAR FUSION WITH PERCUTANEOUS SCREW 1 LEVEL Left 02/11/2018   Procedure: LEFT LATERAL LUMBAR  FOUR-FIVE INTERBODY FUSION WITH INSTRUMENTATION AND ALLOGRAFT;  Surgeon: Phylliss Bob, MD;  Location: [place];  Service: Orthopedics;  Laterality: Left;  LEFT LATERAL LUMBAR  FOUR-FIVE INTERBODY FUSION WITH INSTRUMENTATION AND ALLOGRAFT   BACK SURGERY     Lumbar fusion L5-S1   DIABETES     EYE SURGERY Bilateral    KNEE SURGERY     l 4-l5 status post lateral posterior fusion January 30,2020     NEUROFORAMINAL STENOSIS Left    Severe L4-L5, involving the  level above previous fusion.   PARTIAL HYSTERECTOMY     RADICULOPATHY Left    L4, Secondary to an L4-L5 Spondylolisthesis   SHOULDER SURGERY      There were no vitals filed for this visit.    Subjective Assessment - 06/28/20 2300     Subjective Pt reports L ankle/foot pain; medial, lateral and dorsal. It has bothered her for 2 years and has become wrose about a year ago. Pt states she takes short steps and takes her time when walking to decrease pain. I'm on my feet alot. I don't go out of the house much. Pt reports pins/needles/burning of both feet as well.    Pertinent History DM, CVA,arthritis, L knee TKR    How long can you sit comfortably? 10 mins, elevates    How long can you stand comfortably? 15 mins    How long can you walk comfortably? 48mins, then uses a cart    Patient Stated Goals To be comfortable with shopping and caring for my grandkids    Currently in Pain? Yes    Pain Score 8    range 4-10/10   Pain Location Ankle    Pain Orientation Left    Pain Descriptors / Indicators Discomfort;Burning;Pins and needles    Pain Type Chronic pain    Pain Onset More  than a month ago    Pain Frequency Constant    Aggravating Factors  standing and walking; cutting grass    Pain Relieving Factors epson salt baths, tylenol, rubbing it                [facility] PT Assessment - 06/28/20 0001       Assessment   Medical Diagnosis Posterior tibial tendinitis of left leg    Referring Provider (PT) McDonald, Stephan Minister, DPM``````````````````````````````````````````````````````````````````````````````````````````````````    Onset Date/Surgical Date --   a year   Hand Dominance Right    Prior Therapy No      Precautions   Precautions None    Required Braces or Orthoses Other Brace/Splint   L ankle   Other Brace/Splint shoe orthotics      Restrictions   Weight Bearing Restrictions No      Balance Screen   Has the patient fallen in the past 6 months No      [place] residence    Living Arrangements Children    Type of [place] to enter    Entrance Stairs-Number of Steps 6    Entrance Stairs-Rails Can reach both    Home Layout One level      Prior Function   Level of Independence Independent      Cognition   Overall Cognitive Status Within Functional Limits for tasks assessed      Observation/Other Assessments-Edema    Edema --   No swelling of the L ankle/foot     Sensation   Light Touch Appears Intact      Posture/Postural Control   Posture Comments neutral arches bilat      ROM / Strength   AROM / PROM / Strength AROM;Strength      AROM   Overall AROM Comments L and R ankle PF, Inv,and Ev demonstrated similar AROMs. All L ankle AROMs were painful    AROM Assessment Site Ankle    Right/Left Ankle Right;Left    Right Ankle Dorsiflexion 15    Left Ankle Dorsiflexion 14      Strength   Overall Strength Comments All L ankle resited movements were painful    Strength Assessment Site Ankle    Right/Left Ankle Left    Left Ankle Dorsiflexion 4/5    Left Ankle Plantar Flexion 4/5    Left Ankle Inversion 3/5    Left Ankle Eversion 3/5      Palpation   Palpation comment TTP of the L peri medial and lateral malleoli and dorsum of the L foot      Special Tests   Other special tests SLS: L and R demonstrated 20 sec SLS c eyes open      Transfers   Transfers Sit to Stand;Stand to Sit    Sit to Stand 7: Independent      Ambulation/Gait   Ambulation/Gait No    Gait Pattern Within Functional Limits;Step-through pattern   Neutral heel to toe gait pattern                       Objective measurements completed on examination: See above findings.               PT Education - 06/28/20 2217     Education Details Eavl fuindings, POC, RICE for pain management    Person(s) Educated Patient    Methods Explanation    Comprehension  Verbalized understanding               PT Short Term Goals - 06/28/20 2232       PT SHORT TERM GOAL #1   Title Pt will be ind in an initial HEP    Baseline Started on eval    Status New    Target Date 07/19/20      PT SHORT TERM GOAL #2   Title Pt will voice understanding of measures to assist in the management and reduction of L ankle/foot pain    Status New    Target Date 07/19/20               PT Long Term Goals - 06/28/20 2236       PT LONG TERM GOAL #1   Title Pt will be Ind in an advnced HEP to maintain or progress achived LOF    Status New    Target Date 08/19/20      PT LONG TERM GOAL #2   Title Pt's FOTO score will improve to the predicted value of 40% functional ability    Baseline 36% functional ability    Status New    Target Date 08/19/20      PT LONG TERM GOAL #3   Title Pt will report a decrease in her L ankle/foot range of pain to 3-7/10 with daily activities including prolonged walking associated with shopping and cutting the grass    Baseline 4-10/10    Status New    Target Date 08/19/20      PT LONG TERM GOAL #4   Title Complete 6MWT and set goal    Status New    Target Date 08/19/20                    Plan - 06/28/20 2226     Clinical Impression Statement Pt presents with a chronic Hx of L ankle and foot pain which has progressively wrosened over the past year. Pt reports prolonged standing and walking aggrevates the L ankle/foot most. A differential Dx was difficult to determine with all active and resisted L ankle movements reproducing pain. L ankle AROMs were found WFLs, while strength was limited by pain. Pt may benefit from PT 2w6 to address L ankle strength, decrease pain and optimize functional use of the L ankle/foot.    Personal Factors and Comorbidities Comorbidity 1;Comorbidity 3+;Comorbidity 2    Comorbidities DM, CVA, arthritis, Hx of L knee TKR    Examination-Activity Limitations Locomotion Level;Stand;Squat    Examination-Participation  Restrictions Other   ADLs   Stability/Clinical Decision Making Evolving/Moderate complexity    Clinical Decision Making Moderate    Rehab Potential Good    PT Frequency 2x / week    PT Duration 6 weeks    PT Treatment/Interventions ADLs/Self Care Home Management;Cryotherapy;Electrical Stimulation;Iontophoresis 4mg /ml Dexamethasone;Moist Heat;Ultrasound;Therapeutic exercise;Gait training;Stair training;Functional mobility training;Patient/family education;Manual techniques;Joint Manipulations;Taping;Passive range of motion    PT Next Visit Plan Develop HEP. Complete 6 min walking test and set goal.    PT Home Exercise Plan Yet to be developed    Consulted and Agree with Plan of Care Patient             Patient will benefit from skilled therapeutic intervention in order to improve the following deficits and impairments:  Difficulty walking, Decreased activity tolerance, Pain, Decreased strength  Visit Diagnosis: Posterior tibial tendinitis of left leg  Muscle weakness (generalized)  Pain in left ankle and joints of  left foot  Difficulty in walking, not elsewhere classified     Problem List Patient Active Problem List   Diagnosis Date Noted   Ulnar 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 of left upper extremity 08/09/2019   Pincer nail deformity 05/19/2019   Excessive daytime sleepiness 03/15/2019   Type 2 diabetes mellitus with hyperglycemia, with long-term current use of insulin ([place]) 03/08/2019   Type 2 diabetes mellitus with diabetic polyneuropathy, with long-term current use of insulin ([place]) 03/08/2019   Diabetic neuropathy with neurologic complication ([place]) 67/20/9470   Claudication of both lower extremities ([place]) 02/04/2019   RLS (restless legs syndrome) 02/04/2019   Sleep deprivation 02/04/2019   Hot flashes 02/04/2019   Neck pain 02/04/2019   Chronic migraine without aura, with intractable migraine, so stated, with status migrainosus 01/27/2019   Acute pain of both knees 06/18/2018    Radiculopathy 02/11/2018   Bipolar disorder ([place]) 03/29/2016   Diabetic ketoacidosis ([place]) 03/24/2016   Dermatitis 03/24/2016   HTN (hypertension) 03/24/2016   HLD (hyperlipidemia) 03/24/2016   DM (diabetes mellitus) ([place]) 03/24/2016   Hyperglycemia    Chronic daily headache 11/23/2012   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   Depression 11/23/2012    Gar Ponto MS, PT 06/28/20 11:03 PM   [facility] [address] [place], [place], [address] Phone: [phone]   Fax:  [phone]  Name: Pari Lombard MRN: [id number] Date of Birth: February 22, 1960

## 2020-06-29 ENCOUNTER — Encounter: Payer: Self-pay | Admitting: Nurse Practitioner

## 2020-06-29 ENCOUNTER — Ambulatory Visit (INDEPENDENT_AMBULATORY_CARE_PROVIDER_SITE_OTHER): Payer: Medicare HMO | Admitting: Nurse Practitioner

## 2020-06-29 VITALS — BP 128/84 | HR 81 | Temp 98.3°F | Ht 61.0 in | Wt 203.6 lb

## 2020-06-29 DIAGNOSIS — Z96652 Presence of left artificial knee joint: Secondary | ICD-10-CM

## 2020-06-29 DIAGNOSIS — M25562 Pain in left knee: Secondary | ICD-10-CM

## 2020-06-29 DIAGNOSIS — G8929 Other chronic pain: Secondary | ICD-10-CM | POA: Diagnosis not present

## 2020-06-29 MED ORDER — DICLOFENAC SODIUM 1 % EX GEL
2.0000 g | Freq: Four times a day (QID) | CUTANEOUS | 1 refills | Status: DC
Start: 2020-06-29 — End: 2023-02-23

## 2020-06-29 NOTE — Patient Instructions (Signed)
Use knee brace daily Use voltaren gel and tylenol to manage pain. You will be contacted to schedule appt with ortho

## 2020-06-29 NOTE — Progress Notes (Signed)
Subjective:  Patient ID: Autumn Valenzuela, female    DOB: [birth year]  Age: 60 y.o. MRN: [id number]  CC: Knee Pain (Left knee pain- onset for 4 weeks, pain getting worst its a 10/10. Pain at rest & with activity)  Knee Pain  The incident occurred more than 1 week ago. There was no injury mechanism. The pain is present in the left knee. The quality of the pain is described as aching and stabbing. The pain is severe. The pain has been Constant since onset. Pertinent negatives include no inability to bear weight, muscle weakness, numbness or tingling. She reports no foreign bodies present. The symptoms are aggravated by weight bearing and movement. She has tried NSAIDs, acetaminophen and ice for the symptoms. The treatment provided mild relief.  S/p total L. Knee arthroplasty in 2012.  Reviewed past Medical, Social and Family history today.  Outpatient Medications Prior to Visit  Medication Sig Dispense Refill   Accu-Chek FastClix Lancets MISC TEST 4 TIMES DAILY. DX E11.9 306 each 3   acetaminophen (TYLENOL) 325 MG tablet Take 2 tablets (650 mg total) by mouth every 6 (six) hours as needed. 30 tablet 0   albuterol (PROVENTIL) (2.5 MG/3ML) 0.083% nebulizer solution Take 2.5 mg by nebulization at bedtime as needed for wheezing.     albuterol (VENTOLIN HFA) 108 (90 Base) MCG/ACT inhaler Inhale 2 puffs into the lungs daily as needed for wheezing or shortness of breath. 18 g 2   ALPRAZolam (XANAX) 0.5 MG tablet TAKE 1 TABLET BY MOUTH DAILY AS NEEDED FOR ANXIETY 30 tablet 1   atorvastatin (LIPITOR) 20 MG tablet Take 1 tablet (20 mg total) by mouth daily. 90 tablet 3   Blood Glucose Calibration (ACCU-CHEK GUIDE CONTROL) LIQD 1 each by In Vitro route every 30 (thirty) days. Needs Accu-check guide monitor system. DX. E11.9 (Patient taking differently: 1 each by In Vitro route every 30 (thirty) days. Needs Accu-check guide monitor system. DX. E11.9 2 times daily) 3 each 3   botulinum toxin Type A (BOTOX)  100 units SOLR injection PROVIDER TO INJECT 155 UNITS INTO THE MUSCLES OF THE HEAD AND NECK EVERY 3 MONTHS. DISCARD REMAINDER. 2 each 1   calcium-vitamin D (OSCAL WITH D) 500-200 MG-UNIT per tablet Take 1 tablet by mouth daily with breakfast.     Cholecalciferol (VITAMIN D3) 1.25 MG (50000 UT) CAPS Take 1 capsule by mouth once a week. 4 capsule 0   Continuous Blood Gluc Receiver (FREESTYLE LIBRE 2 READER) DEVI 1 Device by Does not apply route as directed. 1 each 0   Continuous Blood Gluc Sensor (FREESTYLE LIBRE 2 SENSOR) MISC 1 Device by Does not apply route as directed. 2 each 11   cyclobenzaprine (FLEXERIL) 10 MG tablet TAKE 1/2 TO 1 TABLET(5 TO 10 MG) BY MOUTH AT BEDTIME AS NEEDED FOR MUSCLE SPASMS 30 tablet 1   DULoxetine (CYMBALTA) 60 MG capsule Take 1 capsule (60 mg total) by mouth at bedtime. 90 capsule 3   ferrous sulfate 325 (65 FE) MG tablet Take 1 tablet (325 mg total) by mouth every other day. 45 tablet 3   fluticasone (FLONASE) 50 MCG/ACT nasal spray Place 2 sprays into both nostrils daily as needed for allergies. 16 g 5   gabapentin (NEURONTIN) 300 MG capsule 1 capsule twice dailty 180 capsule 3   glucose blood (ACCU-CHEK GUIDE) test strip Use to test blood sugar 4 times a day.  Dx: E11.9 400 each 3   ibuprofen (ADVIL) 800 MG tablet Take 1  tablet (800 mg total) by mouth every 8 (eight) hours as needed. 21 tablet 0   insulin glargine (LANTUS) 100 UNIT/ML Solostar Pen Inject 26 Units into the skin daily. 15 mL 6   Insulin Pen Needle 31G X 5 MM MISC 1 Units by Does not apply route at bedtime. 90 each 3   ketorolac (ACULAR) 0.4 % SOLN Place 1 drop into both eyes daily as needed (eye irritation).      levocetirizine (XYZAL) 5 MG tablet Take 1 tablet (5 mg total) by mouth every evening. 30 tablet 2   loratadine (CLARITIN) 10 MG tablet Take 1 tablet (10 mg total) by mouth daily. (Patient taking differently: Take 10 mg by mouth daily as needed for allergies.) 30 tablet 0   metFORMIN  (GLUCOPHAGE-XR) 500 MG 24 hr tablet Take 1 tablet (500 mg total) by mouth 2 (two) times daily with a meal. 180 tablet 0   tiZANidine (ZANAFLEX) 4 MG tablet START 1 TABLET BY MOUTH AT NIGHT FOR 1 WEEK THEN 2 TIMES DAILY FOR 1 WEEK AND THEN TAKE 1 TABLET 3 TIMES DAILY THEREAFTER 90 tablet 2   topiramate (TOPAMAX) 50 MG tablet Take 50 mg by mouth 2 (two) times daily.     triamcinolone ointment (KENALOG) 0.1 % Apply topically 2 (two) times daily. For no more than 10 days. 30 g 1   HYDROcodone-acetaminophen (NORCO/VICODIN) 5-325 MG tablet Take 1 tablet by mouth every 4 (four) hours as needed for moderate pain. (Patient not taking: Reported on 06/29/2020) 15 tablet 0   traMADol (ULTRAM) 50 MG tablet TAKE 1 TABLET(50 MG) BY MOUTH EVERY 8 HOURS AS NEEDED (Patient not taking: Reported on 06/29/2020) 20 tablet 0   No facility-administered medications prior to visit.    ROS See HPI  Objective:  BP 128/84   Pulse 81   Temp 98.3 F (36.8 C)   Ht 5\' 1"  (1.549 m)   Wt 203 lb 9.6 oz (92.4 kg)   SpO2 98%   BMI 38.47 kg/m   Physical Exam Cardiovascular:     Rate and Rhythm: Normal rate.     Pulses: Normal pulses.  Pulmonary:     Effort: Pulmonary effort is normal.  Musculoskeletal:        General: Swelling and tenderness present.     Right knee: Normal.     Left knee: Swelling, effusion and crepitus present. No erythema. Normal range of motion. Tenderness present over the medial joint line. No patellar tendon tenderness.     Right lower leg: Normal. No edema.     Left lower leg: Normal. No edema.  Skin:    Findings: No erythema.  Neurological:     Mental Status: She is alert and oriented to person, place, and time.    Assessment & Plan:  This visit occurred during the SARS-CoV-2 public health emergency.  Safety protocols were in place, including screening questions prior to the visit, additional usage of staff PPE, and extensive cleaning of exam room while observing appropriate contact time as  indicated for disinfecting solutions.   Autumn Valenzuela was seen today for knee pain.  Diagnoses and all orders for this visit:  Chronic pain of left knee -     diclofenac Sodium (VOLTAREN) 1 % GEL; Apply 2 g topically 4 (four) times daily. -     AMB referral to orthopedics  S/P total knee arthroplasty, left -     diclofenac Sodium (VOLTAREN) 1 % GEL; Apply 2 g topically 4 (four) times daily. -  AMB referral to orthopedics  Use knee brace daily Use voltaren gel and tylenol to manage pain. You will be contacted to schedule appt with ortho.  Problem List Items Addressed This Visit   None Visit Diagnoses     Chronic pain of left knee    -  Primary   Relevant Medications   diclofenac Sodium (VOLTAREN) 1 % GEL   Other Relevant Orders   AMB referral to orthopedics   S/P total knee arthroplasty, left       Relevant Medications   diclofenac Sodium (VOLTAREN) 1 % GEL   Other Relevant Orders   AMB referral to orthopedics       Follow-up: Return if symptoms worsen or fail to improve.  Wilfred Lacy, NP

## 2020-07-04 ENCOUNTER — Ambulatory Visit: Payer: Medicare HMO | Admitting: Orthopaedic Surgery

## 2020-07-05 ENCOUNTER — Ambulatory Visit: Payer: Medicare HMO

## 2020-07-05 ENCOUNTER — Other Ambulatory Visit: Payer: Self-pay

## 2020-07-05 DIAGNOSIS — G8929 Other chronic pain: Secondary | ICD-10-CM | POA: Diagnosis not present

## 2020-07-05 DIAGNOSIS — M25572 Pain in left ankle and joints of left foot: Secondary | ICD-10-CM | POA: Diagnosis not present

## 2020-07-05 DIAGNOSIS — R262 Difficulty in walking, not elsewhere classified: Secondary | ICD-10-CM | POA: Diagnosis not present

## 2020-07-05 DIAGNOSIS — M5442 Lumbago with sciatica, left side: Secondary | ICD-10-CM | POA: Diagnosis not present

## 2020-07-05 DIAGNOSIS — M546 Pain in thoracic spine: Secondary | ICD-10-CM | POA: Diagnosis not present

## 2020-07-05 DIAGNOSIS — M6281 Muscle weakness (generalized): Secondary | ICD-10-CM

## 2020-07-05 DIAGNOSIS — M76822 Posterior tibial tendinitis, left leg: Secondary | ICD-10-CM | POA: Diagnosis not present

## 2020-07-05 DIAGNOSIS — M5441 Lumbago with sciatica, right side: Secondary | ICD-10-CM | POA: Diagnosis not present

## 2020-07-05 NOTE — Therapy (Addendum)
[place], [place], [address] Phone: [phone]   Fax:  [phone]  Physical Therapy Treatment  Patient Details  Name: Autumn Valenzuela MRN: [id number] Date of Birth: December 07, 1960 Referring Provider (PT): [name], DPM``````````````````````````````````````````````````````````````````````````````````````````````````   Encounter Date: 07/05/2020   PT End of Session - 07/05/20 1300     Visit Number 2    Number of Visits 13    Date for PT Re-Evaluation 08/19/20    Authorization Type [employer] MEDICARE HMO/PPO    Progress Note Due on Visit 10    PT Start Time 0806    PT Stop Time 0849    PT Time Calculation (min) 43 min    Equipment Utilized During Treatment Other (comment)   L wrist brace, L knee brace, L ankle brace, L orthotic   Activity Tolerance Patient tolerated treatment well    Behavior During Therapy [facility] for tasks assessed/performed             Past Medical History:  Diagnosis Date   Allergy    Anemia    Anxiety    Arthritis    Back pain    Bipolar disorder (HCC)    CVA (cerebral infarction)    Depression    Diabetes mellitus without complication (HCC)    High cholesterol    Hypertension    Left leg pain    Migraine    Sleep apnea    Stroke [facility])     Past Surgical History:  Procedure Laterality Date   ABDOMINAL HYSTERECTOMY     ANTERIOR LATERAL LUMBAR FUSION WITH PERCUTANEOUS SCREW 1 LEVEL Left 02/11/2018   Procedure: LEFT LATERAL LUMBAR  FOUR-FIVE INTERBODY FUSION WITH INSTRUMENTATION AND ALLOGRAFT;  Surgeon: Phylliss Bob, MD;  Location: [place];  Service: Orthopedics;  Laterality: Left;  LEFT LATERAL LUMBAR  FOUR-FIVE INTERBODY FUSION WITH INSTRUMENTATION AND ALLOGRAFT   BACK SURGERY     Lumbar fusion L5-S1   DIABETES     EYE SURGERY Bilateral    KNEE SURGERY     l 4-l5 status post lateral posterior fusion January 30,2020     NEUROFORAMINAL STENOSIS Left    Severe  L4-L5, involving the level above previous fusion.   PARTIAL HYSTERECTOMY     RADICULOPATHY Left    L4, Secondary to an L4-L5 Spondylolisthesis   SHOULDER SURGERY      There were no vitals filed for this visit.   Subjective Assessment - 07/05/20 0814     Subjective Pt reports her L knee has been bothering her more. Her L ankle is about the same. She states she is active all day long    Patient Stated Goals To be comfortable with shopping and caring for my grandkids    Currently in Pain? Yes    Pain Score 8     Pain Location Ankle    Pain Orientation Left    Pain Descriptors / Indicators Other (Comment);Discomfort   a pain that's hard to discribe and weak   Pain Type Chronic pain    Pain Onset More than a month ago    Pain Frequency Constant                               [facility] Adult PT Treatment/Exercise - 07/05/20 0001       Exercises   Exercises Ankle;Knee/Hip      Knee/Hip Exercises: Supine   Bridges Both;2 sets;10 reps  Straight Leg Raises Left;2 sets;10 reps    Straight Leg Raises Limitations quad set prior to SLR      Knee/Hip Exercises: Sidelying   Hip ABduction Left;2 sets;10 reps    Clams L, 10x2      Ankle Exercises: Aerobic   Stationary Bike 3 mins; L3   limited by l LE fatigue     Ankle Exercises: Sidelying   Other Sidelying Ankle Exercises 4 way ankle c red tband; 15x2                    PT Education - 07/05/20 1300     Education Details Initial HEP    Person(s) Educated Patient    Methods Explanation;Demonstration;Tactile cues;Verbal cues;Handout    Comprehension Verbalized understanding;Returned demonstration;Verbal cues required;Tactile cues required              PT Short Term Goals - 06/28/20 2232       PT SHORT TERM GOAL #1   Title Pt will be ind in an initial HEP    Baseline Started on eval    Status New    Target Date 07/19/20      PT SHORT TERM GOAL #2   Title Pt will voice understanding of  measures to assist in the management and reduction of L ankle/foot pain    Status New    Target Date 07/19/20               PT Long Term Goals - 06/28/20 2236       PT LONG TERM GOAL #1   Title Pt will be Ind in an advnced HEP to maintain or progress achived LOF    Status New    Target Date 08/19/20      PT LONG TERM GOAL #2   Title Pt's FOTO score will improve to the predicted value of 40% functional ability    Baseline 36% functional ability    Status New    Target Date 08/19/20      PT LONG TERM GOAL #3   Title Pt will report a decrease in her L ankle/foot range of pain to 3-7/10 with daily activities including prolonged walking associated with shopping and cutting the grass    Baseline 4-10/10    Status New    Target Date 08/19/20      PT LONG TERM GOAL #4   Title Complete 6MWT and set goal    Status New    Target Date 08/19/20                   Plan - 07/05/20 1303     Clinical Impression Statement PT was completed for L ankle and lower body strengthening for support and stability of the L ankle. Pt returned demonstration of the these exs and a written HEP was provided. Pt tolerated today's PT session without adverse effects.    Personal Factors and Comorbidities Comorbidity 1;Comorbidity 3+;Comorbidity 2    Comorbidities DM, CVA, arthritis, Hx of L knee TKR    Examination-Activity Limitations Locomotion Level;Stand;Squat    Examination-Participation Restrictions Other    Stability/Clinical Decision Making Evolving/Moderate complexity    Clinical Decision Making Moderate    Rehab Potential Good    PT Frequency 2x / week    PT Duration 6 weeks    PT Treatment/Interventions ADLs/Self Care Home Management;Cryotherapy;Electrical Stimulation;Iontophoresis 4mg /ml Dexamethasone;Moist Heat;Ultrasound;Therapeutic exercise;Gait training;Stair training;Functional mobility training;Patient/family education;Manual techniques;Joint Manipulations;Taping;Passive range  of motion    PT Next Visit Plan  Complete 6 min walking test and set goal. Progress therex for L ankle as inidicated    PT Home Exercise Plan [id number]    Consulted and Agree with Plan of Care Patient             Patient will benefit from skilled therapeutic intervention in order to improve the following deficits and impairments:  Difficulty walking, Decreased activity tolerance, Pain, Decreased strength  Visit Diagnosis: Posterior tibial tendinitis of left leg  Pain in left ankle and joints of left foot  Muscle weakness (generalized)  Difficulty in walking, not elsewhere classified  Chronic bilateral low back pain with bilateral sciatica  Pain in thoracic spine     Problem List Patient Active Problem List   Diagnosis Date Noted   Ulnar 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 of left upper extremity 08/09/2019   Pincer nail deformity 05/19/2019   Excessive daytime sleepiness 03/15/2019   Type 2 diabetes mellitus with hyperglycemia, with long-term current use of insulin ([place]) 03/08/2019   Type 2 diabetes mellitus with diabetic polyneuropathy, with long-term current use of insulin ([place]) 03/08/2019   Diabetic neuropathy with neurologic complication ([place]) 51/02/5850   Claudication of both lower extremities ([place]) 02/04/2019   RLS (restless legs syndrome) 02/04/2019   Sleep deprivation 02/04/2019   Hot flashes 02/04/2019   Neck pain 02/04/2019   Chronic migraine without aura, with intractable migraine, so stated, with status migrainosus 01/27/2019   Acute pain of both knees 06/18/2018   Radiculopathy 02/11/2018   Bipolar disorder ([place]) 03/29/2016   Diabetic ketoacidosis ([place]) 03/24/2016   Dermatitis 03/24/2016   HTN (hypertension) 03/24/2016   HLD (hyperlipidemia) 03/24/2016   DM (diabetes mellitus) ([place]) 03/24/2016   Hyperglycemia    Chronic daily headache 11/23/2012   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   Depression 11/23/2012   Gar Ponto MS, PT 07/05/20  1:34 PM   [facility] [address] [place], [place], [address] Phone: [phone]   Fax:  [phone]  Name: Autumn Valenzuela MRN: [id number] Date of Birth: 15-Mar-1960

## 2020-07-07 ENCOUNTER — Encounter: Payer: Self-pay | Admitting: Nurse Practitioner

## 2020-07-07 ENCOUNTER — Other Ambulatory Visit: Payer: Self-pay

## 2020-07-07 ENCOUNTER — Ambulatory Visit (INDEPENDENT_AMBULATORY_CARE_PROVIDER_SITE_OTHER): Payer: Medicare HMO | Admitting: Nurse Practitioner

## 2020-07-07 ENCOUNTER — Telehealth: Payer: Self-pay

## 2020-07-07 ENCOUNTER — Ambulatory Visit: Payer: Medicare HMO

## 2020-07-07 VITALS — BP 98/70 | HR 92 | Temp 97.8°F | Ht 61.0 in | Wt 204.0 lb

## 2020-07-07 DIAGNOSIS — M546 Pain in thoracic spine: Secondary | ICD-10-CM | POA: Diagnosis not present

## 2020-07-07 DIAGNOSIS — R262 Difficulty in walking, not elsewhere classified: Secondary | ICD-10-CM

## 2020-07-07 DIAGNOSIS — M6281 Muscle weakness (generalized): Secondary | ICD-10-CM | POA: Diagnosis not present

## 2020-07-07 DIAGNOSIS — E1159 Type 2 diabetes mellitus with other circulatory complications: Secondary | ICD-10-CM

## 2020-07-07 DIAGNOSIS — Z794 Long term (current) use of insulin: Secondary | ICD-10-CM | POA: Diagnosis not present

## 2020-07-07 DIAGNOSIS — M5442 Lumbago with sciatica, left side: Secondary | ICD-10-CM | POA: Diagnosis not present

## 2020-07-07 DIAGNOSIS — M76822 Posterior tibial tendinitis, left leg: Secondary | ICD-10-CM | POA: Diagnosis not present

## 2020-07-07 DIAGNOSIS — E782 Mixed hyperlipidemia: Secondary | ICD-10-CM

## 2020-07-07 DIAGNOSIS — M25572 Pain in left ankle and joints of left foot: Secondary | ICD-10-CM | POA: Diagnosis not present

## 2020-07-07 DIAGNOSIS — M5441 Lumbago with sciatica, right side: Secondary | ICD-10-CM | POA: Diagnosis not present

## 2020-07-07 DIAGNOSIS — G8929 Other chronic pain: Secondary | ICD-10-CM | POA: Diagnosis not present

## 2020-07-07 LAB — LIPID PANEL
Cholesterol: 214 mg/dL — ABNORMAL HIGH (ref 0–200)
HDL: 47.7 mg/dL (ref >39.00)
LDL Cholesterol: 134 mg/dL — ABNORMAL HIGH (ref 0–99)
NonHDL: 165.93
Total CHOL/HDL Ratio: 4
Triglycerides: 161 mg/dL — ABNORMAL HIGH (ref 0.0–149.0)
VLDL: 32.2 mg/dL (ref 0.0–40.0)

## 2020-07-07 NOTE — Progress Notes (Addendum)
Subjective:  Patient ID: Autumn Valenzuela, female    DOB: 1960/09/12  Age: 60 y.o. MRN: [id number]  CC: Follow-up (Pt here for 3 month f/u, pt is fasting for lab work.  Pt need a referral for lt knee pain.  She is also asking for refills for most medications, as I marked them as not taking.)  HPI  DM (diabetes mellitus) ([place]) Uncontrolled current use of metformin and lantus She is not making and medication changes today Last appt with Dr. Magda Bernheim 09/2019 Advised to schedule another appt  HLD (hyperlipidemia) Repeat lipid panel Current use of atorvastatin 20mg  Lipid Panel     Component Value Date/Time   CHOL 214 (H) 07/07/2020 1227   TRIG 161.0 (H) 07/07/2020 1227   HDL 47.70 07/07/2020 1227   CHOLHDL 4 07/07/2020 1227   VLDL 32.2 07/07/2020 1227   LDLCALC 134 (H) 07/07/2020 1227   Switch atorvastatin to crestor 20mg   Reviewed past Medical, Social and Family history today.  Outpatient Medications Prior to Visit  Medication Sig Dispense Refill   Accu-Chek FastClix Lancets MISC TEST 4 TIMES DAILY. DX E11.9 306 each 3   acetaminophen (TYLENOL) 325 MG tablet Take 2 tablets (650 mg total) by mouth every 6 (six) hours as needed. 30 tablet 0   albuterol (PROVENTIL) (2.5 MG/3ML) 0.083% nebulizer solution Take 2.5 mg by nebulization at bedtime as needed for wheezing.     albuterol (VENTOLIN HFA) 108 (90 Base) MCG/ACT inhaler Inhale 2 puffs into the lungs daily as needed for wheezing or shortness of breath. 18 g 2   Blood Glucose Calibration (ACCU-CHEK GUIDE CONTROL) LIQD 1 each by In Vitro route every 30 (thirty) days. Needs Accu-check guide monitor system. DX. E11.9 (Patient taking differently: 1 each by In Vitro route every 30 (thirty) days. Needs Accu-check guide monitor system. DX. E11.9 2 times daily) 3 each 3   calcium-vitamin D (OSCAL WITH D) 500-200 MG-UNIT per tablet Take 1 tablet by mouth daily with breakfast.     Cholecalciferol (VITAMIN D3) 1.25 MG (50000 UT) CAPS  Take 1 capsule by mouth once a week. 4 capsule 0   Continuous Blood Gluc Receiver (FREESTYLE LIBRE 2 READER) DEVI 1 Device by Does not apply route as directed. 1 each 0   Continuous Blood Gluc Sensor (FREESTYLE LIBRE 2 SENSOR) MISC 1 Device by Does not apply route as directed. 2 each 11   cyclobenzaprine (FLEXERIL) 10 MG tablet TAKE 1/2 TO 1 TABLET(5 TO 10 MG) BY MOUTH AT BEDTIME AS NEEDED FOR MUSCLE SPASMS 30 tablet 1   DULoxetine (CYMBALTA) 60 MG capsule Take 1 capsule (60 mg total) by mouth at bedtime. 90 capsule 3   ferrous sulfate 325 (65 FE) MG tablet Take 1 tablet (325 mg total) by mouth every other day. 45 tablet 3   fluticasone (FLONASE) 50 MCG/ACT nasal spray Place 2 sprays into both nostrils daily as needed for allergies. 16 g 5   gabapentin (NEURONTIN) 300 MG capsule 1 capsule twice dailty 180 capsule 3   glucose blood (ACCU-CHEK GUIDE) test strip Use to test blood sugar 4 times a day.  Dx: E11.9 400 each 3   insulin glargine (LANTUS) 100 UNIT/ML Solostar Pen Inject 26 Units into the skin daily. 15 mL 6   Insulin Pen Needle 31G X 5 MM MISC 1 Units by Does not apply route at bedtime. 90 each 3   metFORMIN (GLUCOPHAGE-XR) 500 MG 24 hr tablet Take 1 tablet (500 mg total) by mouth 2 (two) times  daily with a meal. 180 tablet 0   atorvastatin (LIPITOR) 20 MG tablet Take 1 tablet (20 mg total) by mouth daily. 90 tablet 3   diclofenac Sodium (VOLTAREN) 1 % GEL Apply 2 g topically 4 (four) times daily. (Patient not taking: Reported on 07/07/2020) 100 g 1   ALPRAZolam (XANAX) 0.5 MG tablet TAKE 1 TABLET BY MOUTH DAILY AS NEEDED FOR ANXIETY (Patient not taking: Reported on 07/07/2020) 30 tablet 1   botulinum toxin Type A (BOTOX) 100 units SOLR injection PROVIDER TO INJECT 155 UNITS INTO THE MUSCLES OF THE HEAD AND NECK EVERY 3 MONTHS. DISCARD REMAINDER. (Patient not taking: Reported on 07/07/2020) 2 each 1   HYDROcodone-acetaminophen (NORCO/VICODIN) 5-325 MG tablet Take 1 tablet by mouth every 4 (four)  hours as needed for moderate pain. (Patient not taking: No sig reported) 15 tablet 0   ibuprofen (ADVIL) 800 MG tablet Take 1 tablet (800 mg total) by mouth every 8 (eight) hours as needed. (Patient not taking: Reported on 07/07/2020) 21 tablet 0   ketorolac (ACULAR) 0.4 % SOLN Place 1 drop into both eyes daily as needed (eye irritation).  (Patient not taking: Reported on 07/07/2020)     levocetirizine (XYZAL) 5 MG tablet Take 1 tablet (5 mg total) by mouth every evening. (Patient not taking: Reported on 07/07/2020) 30 tablet 2   loratadine (CLARITIN) 10 MG tablet Take 1 tablet (10 mg total) by mouth daily. (Patient not taking: Reported on 07/07/2020) 30 tablet 0   tiZANidine (ZANAFLEX) 4 MG tablet START 1 TABLET BY MOUTH AT NIGHT FOR 1 WEEK THEN 2 TIMES DAILY FOR 1 WEEK AND THEN TAKE 1 TABLET 3 TIMES DAILY THEREAFTER (Patient not taking: Reported on 07/07/2020) 90 tablet 2   topiramate (TOPAMAX) 50 MG tablet Take 50 mg by mouth 2 (two) times daily. (Patient not taking: Reported on 07/07/2020)     traMADol (ULTRAM) 50 MG tablet TAKE 1 TABLET(50 MG) BY MOUTH EVERY 8 HOURS AS NEEDED (Patient not taking: No sig reported) 20 tablet 0   triamcinolone ointment (KENALOG) 0.1 % Apply topically 2 (two) times daily. For no more than 10 days. (Patient not taking: Reported on 07/07/2020) 30 g 1   No facility-administered medications prior to visit.   ROS See HPI  Objective:  BP 98/70 (BP Location: Right Arm, Patient Position: Sitting, Cuff Size: Normal)   Pulse 92   Temp 97.8 F (36.6 C) (Temporal)   Ht 5\' 1"  (1.549 m)   Wt 204 lb (92.5 kg)   SpO2 97%   BMI 38.55 kg/m   Physical Exam Cardiovascular:     Rate and Rhythm: Normal rate.     Pulses: Normal pulses.  Neurological:     Mental Status: She is alert and oriented to person, place, and time.   Assessment & Plan:  This visit occurred during the SARS-CoV-2 public health emergency.  Safety protocols were in place, including screening questions prior  to the visit, additional usage of staff PPE, and extensive cleaning of exam room while observing appropriate contact time as indicated for disinfecting solutions.   Autumn Valenzuela was seen today for follow-up.  Diagnoses and all orders for this visit:  Type 2 diabetes mellitus with other circulatory complication, with long-term current use of insulin (HCC) -     Discontinue: rosuvastatin (CRESTOR) 40 MG tablet; Take 1 tablet (40 mg total) by mouth daily. -     rosuvastatin (CRESTOR) 20 MG tablet; Take 1 tablet (20 mg total) by mouth daily.  Mixed  hyperlipidemia -     Lipid panel -     Discontinue: rosuvastatin (CRESTOR) 40 MG tablet; Take 1 tablet (40 mg total) by mouth daily. -     rosuvastatin (CRESTOR) 20 MG tablet; Take 1 tablet (20 mg total) by mouth daily.   Problem List Items Addressed This Visit       Endocrine   DM (diabetes mellitus) ([place]) - Primary    Uncontrolled current use of metformin and lantus She is not making and medication changes today Last appt with Dr. Magda Bernheim 09/2019 Advised to schedule another appt       Relevant Medications   rosuvastatin (CRESTOR) 20 MG tablet     Other   HLD (hyperlipidemia)    Repeat lipid panel Current use of atorvastatin 20mg  Lipid Panel     Component Value Date/Time   CHOL 214 (H) 07/07/2020 1227   TRIG 161.0 (H) 07/07/2020 1227   HDL 47.70 07/07/2020 1227   CHOLHDL 4 07/07/2020 1227   VLDL 32.2 07/07/2020 1227   LDLCALC 134 (H) 07/07/2020 1227   Switch atorvastatin to crestor 20mg        Relevant Medications   rosuvastatin (CRESTOR) 20 MG tablet   Other Relevant Orders   Lipid panel (Completed)    Follow-up: Return in about 6 months (around 01/06/2021) for DM and , hyperlipidemia (fasting, 61mins).  Wilfred Lacy, NP

## 2020-07-07 NOTE — Patient Instructions (Signed)
Schedule appt with endocrinology and ortho  Go to lab for blood draw.

## 2020-07-07 NOTE — Telephone Encounter (Signed)
PA for Diclofenac Sodium Gel1% faxed to [facility] @ [phone]. Waiting on response. Dm/cma

## 2020-07-07 NOTE — Assessment & Plan Note (Signed)
Uncontrolled current use of metformin and lantus She is not making and medication changes today Last appt with Dr. Magda Bernheim 09/2019 Advised to schedule another appt

## 2020-07-07 NOTE — Assessment & Plan Note (Addendum)
Repeat lipid panel Current use of atorvastatin 20mg  Lipid Panel     Component Value Date/Time   CHOL 214 (H) 07/07/2020 1227   TRIG 161.0 (H) 07/07/2020 1227   HDL 47.70 07/07/2020 1227   CHOLHDL 4 07/07/2020 1227   VLDL 32.2 07/07/2020 1227   LDLCALC 134 (H) 07/07/2020 1227   Switch atorvastatin to crestor 20mg 

## 2020-07-07 NOTE — Therapy (Signed)
[place], [place], [address] Phone: [phone]   Fax:  [phone]  Physical Therapy Treatment  Patient Details  Name: Autumn Valenzuela MRN: [id number] Date of Birth: [birth year] Referring Provider (PT): [name], DPM``````````````````````````````````````````````````````````````````````````````````````````````````   Encounter Date: 07/07/2020   PT End of Session - 07/07/20 0756     Visit Number 3    Number of Visits 13    Date for PT Re-Evaluation 08/19/20    Authorization Type [employer] MEDICARE HMO/PPO    Progress Note Due on Visit 10    PT Start Time 0800    PT Stop Time 0843    PT Time Calculation (min) 43 min             Past Medical History:  Diagnosis Date   Allergy    Anemia    Anxiety    Arthritis    Back pain    Bipolar disorder (HCC)    CVA (cerebral infarction)    Depression    Diabetes mellitus without complication (HCC)    High cholesterol    Hypertension    Left leg pain    Migraine    Sleep apnea    Stroke [facility])     Past Surgical History:  Procedure Laterality Date   ABDOMINAL HYSTERECTOMY     ANTERIOR LATERAL LUMBAR FUSION WITH PERCUTANEOUS SCREW 1 LEVEL Left 02/11/2018   Procedure: LEFT LATERAL LUMBAR  FOUR-FIVE INTERBODY FUSION WITH INSTRUMENTATION AND ALLOGRAFT;  Surgeon: Phylliss Bob, MD;  Location: [place];  Service: Orthopedics;  Laterality: Left;  LEFT LATERAL LUMBAR  FOUR-FIVE INTERBODY FUSION WITH INSTRUMENTATION AND ALLOGRAFT   BACK SURGERY     Lumbar fusion L5-S1   DIABETES     EYE SURGERY Bilateral    KNEE SURGERY     l 4-l5 status post lateral posterior fusion January 30,2020     NEUROFORAMINAL STENOSIS Left    Severe L4-L5, involving the level above previous fusion.   PARTIAL HYSTERECTOMY     RADICULOPATHY Left    L4, Secondary to an L4-L5 Spondylolisthesis   SHOULDER SURGERY      There were no vitals filed for this visit.   Subjective  Assessment - 07/07/20 0800     Subjective The patient reports that she is doing okay today.  She feels weaker with walking.  She gets tired with walking between rooms in her house.  She feels like her knee replacement is causing the ankle pain.    Pertinent History DM, CVA,arthritis, L knee TKR    Patient Stated Goals To be comfortable with shopping and caring for my grandkids    Currently in Pain? Yes    Pain Score 8     Pain Location Ankle    Pain Orientation Left                [facility] PT Assessment - 07/07/20 0001       Assessment   Medical Diagnosis Posterior tibial tendinitis of left leg    Referring Provider (PT) McDonald, Stephan Minister, DPM``````````````````````````````````````````````````````````````````````````````````````````````````      Precautions   Required Braces or Orthoses Other Brace/Splint   L ankle   Other Brace/Splint shoe orthotics      6 Minute Walk- Baseline   6 Minute Walk- Baseline yes    BP (mmHg) 152/98    HR (bpm) 104    02 Sat (%RA) 99 %    Modified Borg Scale for Dyspnea 0.5- Very, very slight shortness  of breath      6 Minute walk- Post Test   BP (mmHg) 152/103    HR (bpm) 103    02 Sat (%RA) 97 %    Modified Borg Scale for Dyspnea 6-      6 minute walk test results    Aerobic Endurance Distance Walked 408    Endurance additional comments pt did not sit, but lean against the wall to rest twice, decrease of left foot clearance for last 10 feet; Pain rated at 9/10                           [facility] Adult PT Treatment/Exercise - 07/07/20 0001       Knee/Hip Exercises: Aerobic   Stationary Bike level 1 x 5 min with breaks      Knee/Hip Exercises: Supine   Bridges Both;2 sets;10 reps      Manual Therapy   Manual Therapy Soft tissue mobilization    Soft tissue mobilization STM to L post tibialis muscle and tendon      Ankle Exercises: Supine   T-Band 4 way RTB 15 x2                      PT Short Term Goals -  07/07/20 1275       PT SHORT TERM GOAL #1   Title Pt will be ind in an initial HEP    Baseline Started on eval    Status New    Target Date 07/19/20      PT SHORT TERM GOAL #2   Title Pt will voice understanding of measures to assist in the management and reduction of L ankle/foot pain    Status New    Target Date 07/19/20               PT Long Term Goals - 07/07/20 0828       PT LONG TERM GOAL #1   Title Pt will be Ind in an advnced HEP to maintain or progress achived LOF    Status New      PT LONG TERM GOAL #2   Title Pt's FOTO score will improve to the predicted value of 40% functional ability    Baseline 36% functional ability    Status New      PT LONG TERM GOAL #3   Title Pt will report a decrease in her L ankle/foot range of pain to 3-7/10 with daily activities including prolonged walking associated with shopping and cutting the grass    Baseline 4-10/10    Status New      PT LONG TERM GOAL #4   Title The patient will be able to ambulate 500 feet in the 6 minute walk test with Borg at 4.    Baseline 408 Borg at 6    Time 8    Period Weeks    Status New    Target Date 08/19/20                   Plan - 07/07/20 0757     Clinical Impression Statement The patient arrived to therapy wiht reports of pain at a 8/10.  We completed the 6 minute walk test.  The patient was able to walk for 408 feet.  Her Borg was at a 6-7.  The patient reported her pain at a 9/10 at the end of the session.  She was also also able  to complete 5 minutes on the bike with breaks.  Continued with ankle strengthening.  Added STM to the left posterior tibialis muscle and tendon.  Recommend continued therapy for strengthening and improvement of functional mobility/endurance.    Personal Factors and Comorbidities Comorbidity 1;Comorbidity 3+;Comorbidity 2    Comorbidities DM, CVA, arthritis, Hx of L knee TKR    Examination-Activity Limitations Locomotion Level;Stand;Squat     Examination-Participation Restrictions Other    Stability/Clinical Decision Making Evolving/Moderate complexity    Rehab Potential Good    PT Frequency 2x / week    PT Duration 6 weeks    PT Treatment/Interventions ADLs/Self Care Home Management;Cryotherapy;Electrical Stimulation;Iontophoresis 4mg /ml Dexamethasone;Moist Heat;Ultrasound;Therapeutic exercise;Gait training;Stair training;Functional mobility training;Patient/family education;Manual techniques;Joint Manipulations;Taping;Passive range of motion    PT Next Visit Plan Progress therex for L ankle as inidicated, core stabilization, hip strengthening, balance training.    PT Home Exercise Plan [email or web address]    Consulted and Agree with Plan of Care Patient             Patient will benefit from skilled therapeutic intervention in order to improve the following deficits and impairments:  Difficulty walking, Decreased activity tolerance, Pain, Decreased strength  Visit Diagnosis: Posterior tibial tendinitis of left leg  Pain in left ankle and joints of left foot  Difficulty in walking, not elsewhere classified  Muscle weakness (generalized)     Problem List Patient Active Problem List   Diagnosis Date Noted   Ulnar 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 of left upper extremity 08/09/2019   Pincer nail deformity 05/19/2019   Excessive daytime sleepiness 03/15/2019   Type 2 diabetes mellitus with hyperglycemia, with long-term current use of insulin ([place]) 03/08/2019   Type 2 diabetes mellitus with diabetic polyneuropathy, with long-term current use of insulin ([place]) 03/08/2019   Diabetic neuropathy with neurologic complication ([place]) 92/44/6286   Claudication of both lower extremities ([place]) 02/04/2019   RLS (restless legs syndrome) 02/04/2019   Sleep deprivation 02/04/2019   Hot flashes 02/04/2019   Neck pain 02/04/2019   Chronic migraine without aura, with intractable migraine, so stated, with status migrainosus 01/27/2019   Acute pain of both  knees 06/18/2018   Radiculopathy 02/11/2018   Bipolar disorder ([place]) 03/29/2016   Diabetic ketoacidosis ([place]) 03/24/2016   Dermatitis 03/24/2016   HTN (hypertension) 03/24/2016   HLD (hyperlipidemia) 03/24/2016   DM (diabetes mellitus) ([place]) 03/24/2016   Hyperglycemia    Chronic daily headache 11/23/2012   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   Depression 11/23/2012   Rich Number, PT, DPT, OCS, Crt. DN  Bethena Midget 07/07/2020, 8:44 AM  [facility] [address] [place], [place], [address] Phone: [phone]   Fax:  [phone]  Name: Autumn Valenzuela MRN: [id number] Date of Birth: [birth year]

## 2020-07-08 MED ORDER — ROSUVASTATIN CALCIUM 20 MG PO TABS
20.0000 mg | ORAL_TABLET | Freq: Every day | ORAL | 3 refills | Status: DC
Start: 2020-07-08 — End: 2021-03-20

## 2020-07-08 MED ORDER — ROSUVASTATIN CALCIUM 40 MG PO TABS: 40.0000 mg | ORAL_TABLET | Freq: Every day | ORAL | 3 refills | Status: DC

## 2020-07-08 NOTE — Addendum Note (Signed)
Addended by: Wilfred Lacy L on: 07/08/2020 12:05 AM   Modules accepted: Orders

## 2020-07-10 ENCOUNTER — Encounter: Payer: Self-pay | Admitting: Internal Medicine

## 2020-07-10 ENCOUNTER — Ambulatory Visit (INDEPENDENT_AMBULATORY_CARE_PROVIDER_SITE_OTHER): Payer: Medicare HMO | Admitting: Internal Medicine

## 2020-07-10 ENCOUNTER — Other Ambulatory Visit: Payer: Self-pay

## 2020-07-10 ENCOUNTER — Telehealth: Payer: Self-pay | Admitting: Nurse Practitioner

## 2020-07-10 VITALS — BP 110/72 | HR 87 | Ht 61.0 in | Wt 205.0 lb

## 2020-07-10 DIAGNOSIS — Z794 Long term (current) use of insulin: Secondary | ICD-10-CM

## 2020-07-10 DIAGNOSIS — E1165 Type 2 diabetes mellitus with hyperglycemia: Secondary | ICD-10-CM

## 2020-07-10 DIAGNOSIS — E1142 Type 2 diabetes mellitus with diabetic polyneuropathy: Secondary | ICD-10-CM

## 2020-07-10 MED ORDER — EMPAGLIFLOZIN 10 MG PO TABS: 10.0000 mg | ORAL_TABLET | Freq: Every day | ORAL | 6 refills | Status: DC

## 2020-07-10 NOTE — Telephone Encounter (Signed)
[facility] is calling to get clarification on patients Rosuvastatin. Please call them back at [phone].

## 2020-07-10 NOTE — Progress Notes (Signed)
Name: Autumn Valenzuela  Age/ Sex: 60 y.o., female   MRN/ DOB: [id number], Aug 13, 1960     PCP: Flossie Buffy, NP   Reason for Endocrinology Evaluation: Type 2 Diabetes Mellitus  Initial Endocrine Consultative Visit: 03/08/2019    PATIENT IDENTIFIER: Autumn Valenzuela is a 60 y.o. female with a past medical history of .T2Dm, bipolar disorder,migrane headaches  and dyslipidemia  The patient has followed with Endocrinology clinic since 03/08/2019 for consultative assistance with management of her diabetes.  DIABETIC HISTORY:  Autumn Valenzuela was diagnosed with T2DM in 2018.  She has been tried on oral glycemic agents such as glipizide and Metformin, she is intolerant to higher doses of Metformin.  She has been on insulin since 2018.  Her hemoglobin A1c has ranged from  6.7% in 2018, peaking at 14.6% in 2021   On her initial visit to our clinic she had an A1c of 14.6%, she was on metformin and Lantus.  SUBJECTIVE:   During the last visit (08/16/2019): A1c of 8.2%.  We continued metformin and Lantus     Today (07/10/2020): Autumn Valenzuela is here for follow-up on diabetes management. She has not been to our clinic in 10 months. .She has been checking glucose occasionally but she did not bring her meter today.  She has pending left carpal tunnel surgery but this had to be postponed due to an A1c of 8.6%    Has occasional nausea but no diarrhea   Adjusts her insulin based on her glucose readings      HOME DIABETES REGIMEN:  Metformin 500 mg BID  Lantus 26 units daily- adjusts based on sliding scale ?       Statin: Yes ACE-I/ARB: No     METER DOWNLOAD SUMMARY: Did not bring        DIABETIC COMPLICATIONS: Microvascular complications:  Neuropathy Denies: CKD, retinopathy  Last eye exam: Completed 05/2020   Macrovascular complications:  Denies: CAD, PVD, CVA     HISTORY:  Past Medical History:  Past Medical History:  Diagnosis Date   Allergy    Anemia     Anxiety    Arthritis    Back pain    Bipolar disorder (HCC)    CVA (cerebral infarction)    Depression    Diabetes mellitus without complication (HCC)    High cholesterol    Hypertension    Left leg pain    Migraine    Sleep apnea    Stroke [facility])    Past Surgical History:  Past Surgical History:  Procedure Laterality Date   ABDOMINAL HYSTERECTOMY     ANTERIOR LATERAL LUMBAR FUSION WITH PERCUTANEOUS SCREW 1 LEVEL Left 02/11/2018   Procedure: LEFT LATERAL LUMBAR  FOUR-FIVE INTERBODY FUSION WITH INSTRUMENTATION AND ALLOGRAFT;  Surgeon: Phylliss Bob, MD;  Location: [place];  Service: Orthopedics;  Laterality: Left;  LEFT LATERAL LUMBAR  FOUR-FIVE INTERBODY FUSION WITH INSTRUMENTATION AND ALLOGRAFT   BACK SURGERY     Lumbar fusion L5-S1   DIABETES     EYE SURGERY Bilateral    KNEE SURGERY     l 4-l5 status post lateral posterior fusion January 30,2020     NEUROFORAMINAL STENOSIS Left    Severe L4-L5, involving the level above previous fusion.   PARTIAL HYSTERECTOMY     RADICULOPATHY Left    L4, Secondary to an L4-L5 Spondylolisthesis   SHOULDER SURGERY     Social History:  reports that she quit smoking about 4 years ago. Her smoking use  included cigarettes. She has never used smokeless tobacco. She reports current alcohol use. She reports previous drug use. Drug: Marijuana. Family History:  Family History  Problem Relation Age of Onset   Diabetes Mother    Stroke Father    Cancer Paternal Aunt    Cancer Paternal Aunt    Diabetes Brother    Colon cancer Neg Hx    Esophageal cancer Neg Hx    Rectal cancer Neg Hx    Stomach cancer Neg Hx      HOME MEDICATIONS: Allergies as of 07/10/2020       Reactions   Cefazolin Swelling, Other (See Comments)   Rapid iv infusion caused swelling and burning at the iv site        Medication List        Accurate as of July 10, 2020 12:39 PM. If you have any questions, ask your nurse or doctor.          Accu-Chek FastClix  Lancets Misc TEST 4 TIMES DAILY. DX E11.9   Accu-Chek Guide Control Liqd 1 each by In Vitro route every 30 (thirty) days. Needs Accu-check guide monitor system. DX. E11.9 What changed: additional instructions   Accu-Chek Guide test strip Generic drug: glucose blood Use to test blood sugar 4 times a day.  Dx: E11.9   acetaminophen 325 MG tablet Commonly known as: Tylenol Take 2 tablets (650 mg total) by mouth every 6 (six) hours as needed.   albuterol 108 (90 Base) MCG/ACT inhaler Commonly known as: VENTOLIN HFA Inhale 2 puffs into the lungs daily as needed for wheezing or shortness of breath.   albuterol (2.5 MG/3ML) 0.083% nebulizer solution Commonly known as: PROVENTIL Take 2.5 mg by nebulization at bedtime as needed for wheezing.   calcium-vitamin D 500-200 MG-UNIT tablet Commonly known as: OSCAL WITH D Take 1 tablet by mouth daily with breakfast.   cyclobenzaprine 10 MG tablet Commonly known as: FLEXERIL TAKE 1/2 TO 1 TABLET(5 TO 10 MG) BY MOUTH AT BEDTIME AS NEEDED FOR MUSCLE SPASMS   diclofenac Sodium 1 % Gel Commonly known as: Voltaren Apply 2 g topically 4 (four) times daily.   DULoxetine 60 MG capsule Commonly known as: CYMBALTA Take 1 capsule (60 mg total) by mouth at bedtime.   empagliflozin 10 MG Tabs tablet Commonly known as: Jardiance Take 1 tablet (10 mg total) by mouth daily before breakfast. Started by: Dorita Sciara, MD   ferrous sulfate 325 (65 FE) MG tablet Take 1 tablet (325 mg total) by mouth every other day.   fluticasone 50 MCG/ACT nasal spray Commonly known as: FLONASE Place 2 sprays into both nostrils daily as needed for allergies.   FreeStyle Libre 2 Reader Devi 1 Device by Does not apply route as directed.   FreeStyle Libre 2 Sensor Misc 1 Device by Does not apply route as directed.   gabapentin 300 MG capsule Commonly known as: NEURONTIN 1 capsule twice dailty   insulin glargine 100 UNIT/ML Solostar Pen Commonly known  as: LANTUS Inject 26 Units into the skin daily.   Insulin Pen Needle 31G X 5 MM Misc 1 Units by Does not apply route at bedtime.   metFORMIN 500 MG 24 hr tablet Commonly known as: GLUCOPHAGE-XR Take 1 tablet (500 mg total) by mouth 2 (two) times daily with a meal.   rosuvastatin 20 MG tablet Commonly known as: Crestor Take 1 tablet (20 mg total) by mouth daily.   Vitamin D3 1.25 MG (50000 UT) Caps Take 1  capsule by mouth once a week.         OBJECTIVE:   Vital Signs: BP 110/72   Pulse 87   Ht 5\' 1"  (1.549 m)   Wt 205 lb (93 kg)   SpO2 99%   BMI 38.73 kg/m   Wt Readings from Last 3 Encounters:  07/10/20 205 lb (93 kg)  07/07/20 204 lb (92.5 kg)  06/29/20 203 lb 9.6 oz (92.4 kg)     Exam: General: Autumn Valenzuela appears well and is in NAD  Lungs: Clear with good BS bilat with no rales, rhonchi, or wheezes  Heart: RRR with normal S1 and S2 and no gallops; no murmurs; no rub  Extremities: No pretibial edema.   Neuro: MS is good with appropriate affect, Autumn Valenzuela is alert and Ox3      DATA REVIEWED:  Lab Results  Component Value Date   HGBA1C 8.6 (H) 06/21/2020   HGBA1C 8.1 (A) 08/16/2019   HGBA1C 14.4 (A) 05/03/2019   Lab Results  Component Value Date   MICROALBUR <0.7 09/22/2019   LDLCALC 134 (H) 07/07/2020   CREATININE 0.75 05/27/2020   Lab Results  Component Value Date   MICRALBCREAT 0.5 09/22/2019     Lab Results  Component Value Date   CHOL 214 (H) 07/07/2020   HDL 47.70 07/07/2020   LDLCALC 134 (H) 07/07/2020   TRIG 161.0 (H) 07/07/2020   CHOLHDL 4 07/07/2020        ASSESSMENT / PLAN / RECOMMENDATIONS:   1) Type 2 Diabetes Mellitus, Poorly Controlled , With neuropathic complications - Most recent A1c of 8.6 %. Goal A1c <7.0%.      - Pending left carpal tunnel arm sx , A1c goal for the surgery is <7.5% - She was advised to keep her basal dose of insulin steady and NOT to use the scale as the scale is for prandial use only  - Discussed adding SGLT-2  inhibitors, cautioned against genital infections   MEDICATIONS: Continue Metformin 500 mg 1 tablet with breakfast Continue  lantus  26 units daily  Start Jardiance 10 mg daily  EDUCATION / INSTRUCTIONS: BG monitoring instructions: Patient is instructed to check her blood sugars 2 times a day, fasting and bedtime as much as possible Call [facility] [facility] if: BG persistently  <70 I reviewed the Rule of 15 for the treatment of hypoglycemia in detail with the patient. Literature supplied.    2) Diabetic complications:  Eye: Does not have known diabetic retinopathy.  Neuro/ Feet: Does have known diabetic peripheral neuropathy. Renal: Patient does not have known baseline CKD. She is not an ACEI/ARB at present      F/U in 2 months    Signed electronically by: Mack Guise, MD  [facility] Endocrinology  [facility] [address] [place], [place] [address] Phone: [phone] FAX: [phone]   CC: Flossie Buffy, [place] [address] Phone: [phone]  Fax: [phone]  Return to Endocrinology clinic as below: Future Appointments  Date Time Provider [place]  07/11/2020  8:00 AM Angeline Slim [facility]  07/13/2020  8:00 AM Angeline Slim [facility]  07/18/2020  8:45 AM Angeline Slim [facility]  07/20/2020  8:45 AM Angeline Slim [facility]  07/21/2020  9:15 AM Leandrew Koyanagi, MD [facility] None  07/25/2020  8:00 AM Gar Ponto, Autumn Valenzuela [facility]  07/25/2020 10:45 AM [name], DPM [facility]  07/27/2020  8:00 AM  Gar Ponto, Autumn Valenzuela [facility]  08/01/2020  8:00 AM Ralls, Laurin Coder [facility]  08/03/2020  8:00 AM Angeline Slim [facility]  08/08/2020  8:00 AM Angeline Slim [facility]  08/10/2020  8:00 AM Angeline Slim [facility]  09/15/2020 11:10 AM Camryn Lampson, Melanie Crazier, MD [facility] None

## 2020-07-10 NOTE — Patient Instructions (Addendum)
-   Continue Metformin 500 mg 1 tablet Twice a day  - Continue  lantus 26 units daily  - Start Jardiance ( Or Wilder Glade- which ever insurance covers) , 1 tablet daily        HOW TO TREAT LOW BLOOD SUGARS (Blood sugar LESS THAN 70 MG/DL) Please follow the RULE OF 15 for the treatment of hypoglycemia treatment (when your (blood sugars are less than 70 mg/dL)   STEP 1: Take 15 grams of carbohydrates when your blood sugar is low, which includes:  3-4 GLUCOSE TABS  OR 3-4 OZ OF JUICE OR REGULAR SODA OR ONE TUBE OF GLUCOSE GEL    STEP 2: RECHECK blood sugar in 15 MINUTES STEP 3: If your blood sugar is still low at the 15 minute recheck --> then, go back to STEP 1 and treat AGAIN with another 15 grams of carbohydrates.

## 2020-07-11 ENCOUNTER — Ambulatory Visit: Payer: Medicare HMO

## 2020-07-11 ENCOUNTER — Other Ambulatory Visit: Payer: Self-pay

## 2020-07-11 DIAGNOSIS — M25572 Pain in left ankle and joints of left foot: Secondary | ICD-10-CM | POA: Diagnosis not present

## 2020-07-11 DIAGNOSIS — M5441 Lumbago with sciatica, right side: Secondary | ICD-10-CM | POA: Diagnosis not present

## 2020-07-11 DIAGNOSIS — M76822 Posterior tibial tendinitis, left leg: Secondary | ICD-10-CM

## 2020-07-11 DIAGNOSIS — R262 Difficulty in walking, not elsewhere classified: Secondary | ICD-10-CM | POA: Diagnosis not present

## 2020-07-11 DIAGNOSIS — M546 Pain in thoracic spine: Secondary | ICD-10-CM | POA: Diagnosis not present

## 2020-07-11 DIAGNOSIS — G8929 Other chronic pain: Secondary | ICD-10-CM | POA: Diagnosis not present

## 2020-07-11 DIAGNOSIS — M6281 Muscle weakness (generalized): Secondary | ICD-10-CM

## 2020-07-11 DIAGNOSIS — M5442 Lumbago with sciatica, left side: Secondary | ICD-10-CM | POA: Diagnosis not present

## 2020-07-11 NOTE — Telephone Encounter (Signed)
PA denied due to not receiving all of the information needed.  Dm/cma

## 2020-07-12 ENCOUNTER — Telehealth: Payer: Self-pay

## 2020-07-12 NOTE — Telephone Encounter (Signed)
Spoke to patient's POA, [place], regarding wanting a handicap placard. She states that patient has a hard time walking long distances due to neuropathy/DM. Advised that I would send a message to Baldo Ash to address this when she returns on July 11th.    Please review and advise.  Thanks. Dm/cma

## 2020-07-12 NOTE — Therapy (Signed)
[place], [place], [address] Phone: [phone]   Fax:  [phone]  Physical Therapy Treatment  Patient Details  Name: Autumn Valenzuela MRN: [id number] Date of Birth: 11-22-1960 Referring Provider (PT): [name], DPM``````````````````````````````````````````````````````````````````````````````````````````````````   Encounter Date: 07/11/2020   PT End of Session - 07/11/20 0840     Visit Number 4    Number of Visits 13    Date for PT Re-Evaluation 08/19/20    Authorization Type AETNA MEDICARE HMO/PPO    Progress Note Due on Visit 10    PT Start Time 0807    PT Stop Time 0846    PT Time Calculation (min) 39 min    Equipment Utilized During Treatment Other (comment)   L wrist brace, L knee brace, L ankle brace, L orthotic   Activity Tolerance Patient tolerated treatment well    Behavior During Therapy [facility] for tasks assessed/performed             Past Medical History:  Diagnosis Date   Allergy    Anemia    Anxiety    Arthritis    Back pain    Bipolar disorder (HCC)    CVA (cerebral infarction)    Depression    Diabetes mellitus without complication (HCC)    High cholesterol    Hypertension    Left leg pain    Migraine    Sleep apnea    Stroke [facility])     Past Surgical History:  Procedure Laterality Date   ABDOMINAL HYSTERECTOMY     ANTERIOR LATERAL LUMBAR FUSION WITH PERCUTANEOUS SCREW 1 LEVEL Left 02/11/2018   Procedure: LEFT LATERAL LUMBAR  FOUR-FIVE INTERBODY FUSION WITH INSTRUMENTATION AND ALLOGRAFT;  Surgeon: Phylliss Bob, MD;  Location: [facility];  Service: Orthopedics;  Laterality: Left;  LEFT LATERAL LUMBAR  FOUR-FIVE INTERBODY FUSION WITH INSTRUMENTATION AND ALLOGRAFT   BACK SURGERY     Lumbar fusion L5-S1   DIABETES     EYE SURGERY Bilateral    KNEE SURGERY     l 4-l5 status post lateral posterior fusion January 30,2020     NEUROFORAMINAL STENOSIS Left    Severe  L4-L5, involving the level above previous fusion.   PARTIAL HYSTERECTOMY     RADICULOPATHY Left    L4, Secondary to an L4-L5 Spondylolisthesis   SHOULDER SURGERY      There were no vitals filed for this visit.   Subjective Assessment - 07/11/20 0810     Subjective Pt reports her L  heel has been bothering the past couple of days. Pt is to see Dr. Latanya Maudlin next month re: her L old knee replacement. Pt notes L heel pain has occured in the past and she received injections which where helpful. Pt reports her L forefoot is painful as well from walking on her toes.    Pertinent History DM, CVA,arthritis, L knee TKR    Patient Stated Goals To be comfortable with shopping and caring for my grandkids    Currently in Pain? Yes    Pain Score 9     Pain Location Foot    Pain Orientation Distal   forefoor   Pain Descriptors / Indicators Constant    Pain Type Chronic pain    Pain Onset More than a month ago    Pain Frequency Constant  [facility] Adult PT Treatment/Exercise - 07/11/20 0001       Self-Care   Self-Care Other Self-Care Comments    Other Self-Care Comments  Tennis ball massage to the plantar surface of the foot for 5 mins and roller massage to posterior calf      Exercises   Exercises Ankle;Knee/Hip      Knee/Hip Exercises: Standing   Hip Flexion Left;15 reps;Knee straight    Hip Abduction Left;15 reps;Knee straight    Hip Extension Left;15 reps;Knee straight      Knee/Hip Exercises: Supine   Hip Adduction Isometric Both;2 sets;10 reps    Straight Leg Raises Left;2 sets;10 reps    Straight Leg Raises Limitations quad set prior to SLR    Other Supine Knee/Hip Exercises Clams 10x2, green theraband      Ankle Exercises: Stretches   Gastroc Stretch 3 reps;30 seconds      Ankle Exercises: Aerobic   Stationary Bike L2; 3 mins      Ankle Exercises: Seated   Towel Crunch 5 reps   3 sets   Other Seated Ankle Exercises Arch lifts,  10x2                      PT Short Term Goals - 07/07/20 8786       PT SHORT TERM GOAL #1   Title Pt will be ind in an initial HEP    Baseline Started on eval    Status New    Target Date 07/19/20      PT SHORT TERM GOAL #2   Title Pt will voice understanding of measures to assist in the management and reduction of L ankle/foot pain    Status New    Target Date 07/19/20               PT Long Term Goals - 07/07/20 7672       PT LONG TERM GOAL #1   Title Pt will be Ind in an advnced HEP to maintain or progress achived LOF    Status New      PT LONG TERM GOAL #2   Title Pt's FOTO score will improve to the predicted value of 40% functional ability    Baseline 36% functional ability    Status New      PT LONG TERM GOAL #3   Title Pt will report a decrease in her L ankle/foot range of pain to 3-7/10 with daily activities including prolonged walking associated with shopping and cutting the grass    Baseline 4-10/10    Status New      PT LONG TERM GOAL #4   Title The patient will be able to ambulate 500 feet in the 6 minute walk test with Borg at 4.    Baseline 408 Borg at 6    Time 8    Period Weeks    Status New    Target Date 08/19/20                   Plan - 07/11/20 0841     Clinical Impression Statement Pt presents to PT with wrosen L foot pain with new areas of pain with the heel and forefoot. Pt indicates having issues with her heel in the past and received injections which helped. PT was provided for toe and plantar foot strengthening exs, as well as for hip strength to help support the L ankle. Pt's L ankle/foot pain persisted. Use of a cold pack  was recommended, but pt declined stating she was planning to go home and complete an epson salt bath for her L foot. Pt has multiple areas of pain with her L foot, knee and hip limiting functional improvement.    Personal Factors and Comorbidities Comorbidity 1;Comorbidity 3+;Comorbidity 2     Comorbidities DM, CVA, arthritis, Hx of L knee TKR    Examination-Activity Limitations Locomotion Level;Stand;Squat    Examination-Participation Restrictions Other    Stability/Clinical Decision Making Evolving/Moderate complexity    Clinical Decision Making Moderate    Rehab Potential Good    PT Frequency 2x / week    PT Duration 6 weeks    PT Treatment/Interventions ADLs/Self Care Home Management;Cryotherapy;Electrical Stimulation;Iontophoresis 4mg /ml Dexamethasone;Moist Heat;Ultrasound;Therapeutic exercise;Gait training;Stair training;Functional mobility training;Patient/family education;Manual techniques;Joint Manipulations;Taping;Passive range of motion    PT Next Visit Plan Progress therex for L ankle as inidicated, core stabilization, hip strengthening, balance training.    PT Home Exercise Plan [email or web address]    Consulted and Agree with Plan of Care Patient             Patient will benefit from skilled therapeutic intervention in order to improve the following deficits and impairments:  Difficulty walking, Decreased activity tolerance, Pain, Decreased strength  Visit Diagnosis: Posterior tibial tendinitis of left leg  Pain in left ankle and joints of left foot  Muscle weakness (generalized)  Difficulty in walking, not elsewhere classified     Problem List Patient Active Problem List   Diagnosis Date Noted   Ulnar 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 of left upper extremity 08/09/2019   Pincer nail deformity 05/19/2019   Excessive daytime sleepiness 03/15/2019   Type 2 diabetes mellitus with hyperglycemia, with long-term current use of insulin ([place]) 03/08/2019   Type 2 diabetes mellitus with diabetic polyneuropathy, with long-term current use of insulin ([place]) 03/08/2019   Diabetic neuropathy with neurologic complication ([place]) 17/51/0258   Claudication of both lower extremities ([place]) 02/04/2019   RLS (restless legs syndrome) 02/04/2019   Sleep deprivation 02/04/2019   Hot flashes  02/04/2019   Neck pain 02/04/2019   Chronic migraine without aura, with intractable migraine, so stated, with status migrainosus 01/27/2019   Acute pain of both knees 06/18/2018   Radiculopathy 02/11/2018   Bipolar disorder ([place]) 03/29/2016   Diabetic ketoacidosis ([place]) 03/24/2016   Dermatitis 03/24/2016   HTN (hypertension) 03/24/2016   HLD (hyperlipidemia) 03/24/2016   DM (diabetes mellitus) ([place]) 03/24/2016   Chronic daily headache 11/23/2012   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   Depression 11/23/2012    Gar Ponto MS, PT 07/12/20 12:23 AM   [facility] [address] [place], [place], [address] Phone: [phone]   Fax:  [phone]  Name: Kenyetta Wimbish MRN: [id number] Date of Birth: July 14, 1960

## 2020-07-12 NOTE — Telephone Encounter (Signed)
Clarification was faxed back to them on 07/11/20. Dm/cma

## 2020-07-12 NOTE — Telephone Encounter (Signed)
Pt calling to ask nurse how to get a handicap sticker.  Pt would like a call back CB#[phone].  Pt aware that Baldo Ash is out of office.

## 2020-07-13 ENCOUNTER — Other Ambulatory Visit: Payer: Self-pay

## 2020-07-13 ENCOUNTER — Ambulatory Visit: Payer: Medicare HMO

## 2020-07-13 DIAGNOSIS — M5441 Lumbago with sciatica, right side: Secondary | ICD-10-CM

## 2020-07-13 DIAGNOSIS — M5442 Lumbago with sciatica, left side: Secondary | ICD-10-CM | POA: Diagnosis not present

## 2020-07-13 DIAGNOSIS — M6281 Muscle weakness (generalized): Secondary | ICD-10-CM

## 2020-07-13 DIAGNOSIS — R262 Difficulty in walking, not elsewhere classified: Secondary | ICD-10-CM

## 2020-07-13 DIAGNOSIS — G8929 Other chronic pain: Secondary | ICD-10-CM

## 2020-07-13 DIAGNOSIS — M546 Pain in thoracic spine: Secondary | ICD-10-CM

## 2020-07-13 DIAGNOSIS — M76822 Posterior tibial tendinitis, left leg: Secondary | ICD-10-CM | POA: Diagnosis not present

## 2020-07-13 DIAGNOSIS — M25572 Pain in left ankle and joints of left foot: Secondary | ICD-10-CM | POA: Diagnosis not present

## 2020-07-13 NOTE — Therapy (Signed)
[place], [place], [address] Phone: [phone]   Fax:  [phone]  Physical Therapy Treatment  Patient Details  Name: Autumn Valenzuela MRN: [id number] Date of Birth: 1960-11-17 Referring Provider (PT): [name], DPM``````````````````````````````````````````````````````````````````````````````````````````````````   Encounter Date: 07/13/2020   PT End of Session - 07/13/20 4401     Visit Number 5    Number of Visits 13    Date for PT Re-Evaluation 08/19/20    Authorization Type [employer] MEDICARE HMO/PPO    Progress Note Due on Visit 10    PT Start Time 0805    PT Stop Time 0845    PT Time Calculation (min) 40 min    Equipment Utilized During Treatment Other (comment)   L wrist brace, L knee brace, L ankle brace, L orthotic   Activity Tolerance Patient tolerated treatment well    Behavior During Therapy [facility] for tasks assessed/performed             Past Medical History:  Diagnosis Date   Allergy    Anemia    Anxiety    Arthritis    Back pain    Bipolar disorder (HCC)    CVA (cerebral infarction)    Depression    Diabetes mellitus without complication (HCC)    High cholesterol    Hypertension    Left leg pain    Migraine    Sleep apnea    Stroke [facility])     Past Surgical History:  Procedure Laterality Date   ABDOMINAL HYSTERECTOMY     ANTERIOR LATERAL LUMBAR FUSION WITH PERCUTANEOUS SCREW 1 LEVEL Left 02/11/2018   Procedure: LEFT LATERAL LUMBAR  FOUR-FIVE INTERBODY FUSION WITH INSTRUMENTATION AND ALLOGRAFT;  Surgeon: Phylliss Bob, MD;  Location: [place];  Service: Orthopedics;  Laterality: Left;  LEFT LATERAL LUMBAR  FOUR-FIVE INTERBODY FUSION WITH INSTRUMENTATION AND ALLOGRAFT   BACK SURGERY     Lumbar fusion L5-S1   DIABETES     EYE SURGERY Bilateral    KNEE SURGERY     l 4-l5 status post lateral posterior fusion January 30,2020     NEUROFORAMINAL STENOSIS Left    Severe  L4-L5, involving the level above previous fusion.   PARTIAL HYSTERECTOMY     RADICULOPATHY Left    L4, Secondary to an L4-L5 Spondylolisthesis   SHOULDER SURGERY      There were no vitals filed for this visit.   Subjective Assessment - 07/13/20 0814     Subjective Mostly L ankle pain today, ant/lat.  Heel pain is better.    Patient Stated Goals To be comfortable with shopping and caring for my grandkids    Currently in Pain? Yes    Pain Score 6     Pain Location Ankle    Pain Orientation Left;Anterior;Lateral    Pain Descriptors / Indicators Aching    Pain Type Chronic pain    Pain Onset More than a month ago    Pain Frequency Constant                               [facility] Adult PT Treatment/Exercise - 07/13/20 0001       Ambulation/Gait   Gait Comments Pt walks c a heel to toe gait pattern each LE, but at slower pace   Ankle support brace     Ankle Exercises: Seated   Towel Crunch 5 reps   3 sets  Other Seated Ankle Exercises Arch lifts, 20    Other Seated Ankle Exercises Tennis ball massage to the plantar surface of foot      Ankle Exercises: Supine   T-Band 4 way RTB 10x      Ankle Exercises: Standing   Rocker Board 1 minute   forward and laterally   Heel Raises 20 reps    Toe Raise 20 reps    Other Standing Ankle Exercises LAteral and forward step ups to airex mat 20x each                 Balance Exercises - 07/13/20 0001       Balance Exercises: Standing   Tandem Stance Eyes open;2 reps;Upper extremity support 1   UE support as needed                PT Short Term Goals - 07/07/20 0828       PT SHORT TERM GOAL #1   Title Pt will be ind in an initial HEP    Baseline Started on eval    Status New    Target Date 07/19/20      PT SHORT TERM GOAL #2   Title Pt will voice understanding of measures to assist in the management and reduction of L ankle/foot pain    Status New    Target Date 07/19/20                PT Long Term Goals - 07/07/20 0828       PT LONG TERM GOAL #1   Title Pt will be Ind in an advnced HEP to maintain or progress achived LOF    Status New      PT LONG TERM GOAL #2   Title Pt's FOTO score will improve to the predicted value of 40% functional ability    Baseline 36% functional ability    Status New      PT LONG TERM GOAL #3   Title Pt will report a decrease in her L ankle/foot range of pain to 3-7/10 with daily activities including prolonged walking associated with shopping and cutting the grass    Baseline 4-10/10    Status New      PT LONG TERM GOAL #4   Title The patient will be able to ambulate 500 feet in the 6 minute walk test with Borg at 4.    Baseline 408 Borg at 6    Time 8    Period Weeks    Status New    Target Date 08/19/20                   Plan - 07/13/20 1962     Clinical Impression Statement Pt presents with decreased L heel pain and an improved gait pattern, heel to toe, but at a decreased pace. PT was provided for L ankle/calf stretching, strengthening, balance/stability exs. L foot/ankle pain is better today, but pt has multiple areas of pain with the foot/ankle/knee/hip which affect her activity level and functional ability.    Personal Factors and Comorbidities Comorbidity 1;Comorbidity 3+;Comorbidity 2    Comorbidities DM, CVA, arthritis, Hx of L knee TKR    Examination-Activity Limitations Locomotion Level;Stand;Squat    Examination-Participation Restrictions Other    Stability/Clinical Decision Making Evolving/Moderate complexity    Clinical Decision Making Moderate    Rehab Potential Good    PT Frequency 2x / week    PT Duration 6 weeks    PT Treatment/Interventions  ADLs/Self Care Home Management;Cryotherapy;Electrical Stimulation;Iontophoresis 4mg /ml Dexamethasone;Moist Heat;Ultrasound;Therapeutic exercise;Gait training;Stair training;Functional mobility training;Patient/family education;Manual techniques;Joint  Manipulations;Taping;Passive range of motion    PT Next Visit Plan Progress therex for L ankle as inidicated, core stabilization, hip strengthening, balance training. Assess STGs and SLS balance.    PT Home Exercise Plan [id number]    Consulted and Agree with Plan of Care Patient             Patient will benefit from skilled therapeutic intervention in order to improve the following deficits and impairments:  Difficulty walking, Decreased activity tolerance, Pain, Decreased strength  Visit Diagnosis: Posterior tibial tendinitis of left leg  Pain in left ankle and joints of left foot  Muscle weakness (generalized)  Difficulty in walking, not elsewhere classified  Chronic bilateral low back pain with bilateral sciatica  Pain in thoracic spine     Problem List Patient Active Problem List   Diagnosis Date Noted   Ulnar 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 of left upper extremity 08/09/2019   Pincer nail deformity 05/19/2019   Excessive daytime sleepiness 03/15/2019   Type 2 diabetes mellitus with hyperglycemia, with long-term current use of insulin ([place]) 03/08/2019   Type 2 diabetes mellitus with diabetic polyneuropathy, with long-term current use of insulin ([place]) 03/08/2019   Diabetic neuropathy with neurologic complication ([place]) 33/82/5053   Claudication of both lower extremities ([place]) 02/04/2019   RLS (restless legs syndrome) 02/04/2019   Sleep deprivation 02/04/2019   Hot flashes 02/04/2019   Neck pain 02/04/2019   Chronic migraine without aura, with intractable migraine, so stated, with status migrainosus 01/27/2019   Acute pain of both knees 06/18/2018   Radiculopathy 02/11/2018   Bipolar disorder ([place]) 03/29/2016   Diabetic ketoacidosis ([place]) 03/24/2016   Dermatitis 03/24/2016   HTN (hypertension) 03/24/2016   HLD (hyperlipidemia) 03/24/2016   DM (diabetes mellitus) ([place]) 03/24/2016   Chronic daily headache 11/23/2012   Other generalized ischemic cerebrovascular disease  11/23/2012   Depression 11/23/2012   Gar Ponto MS, PT 07/13/20 9:00 AM   [facility] [address] [place], [place], [address] Phone: [phone]   Fax:  [phone]  Name: Autumn Valenzuela MRN: [id number] Date of Birth: 1960-07-01

## 2020-07-18 ENCOUNTER — Ambulatory Visit: Payer: Medicare HMO

## 2020-07-21 ENCOUNTER — Ambulatory Visit (INDEPENDENT_AMBULATORY_CARE_PROVIDER_SITE_OTHER): Payer: Medicare HMO | Admitting: Orthopaedic Surgery

## 2020-07-21 ENCOUNTER — Ambulatory Visit (INDEPENDENT_AMBULATORY_CARE_PROVIDER_SITE_OTHER): Payer: Medicare HMO

## 2020-07-21 ENCOUNTER — Encounter: Payer: Self-pay | Admitting: Orthopaedic Surgery

## 2020-07-21 DIAGNOSIS — G8929 Other chronic pain: Secondary | ICD-10-CM

## 2020-07-21 DIAGNOSIS — M25562 Pain in left knee: Secondary | ICD-10-CM | POA: Diagnosis not present

## 2020-07-21 NOTE — Progress Notes (Signed)
Office Visit Note   Patient: Autumn Valenzuela           Date of Birth: December 12, 1960           MRN: [id number] Visit Date: 07/21/2020              Requested by: Flossie Buffy, NP [place],  [place] [address] PCP: Flossie Buffy, NP   Assessment & Plan: Visit Diagnoses:  1. Chronic pain of left knee     Plan: Based on findings my impression is that she has instability both varus valgus and anterior posterior.  I think the probably the need to be increased to a larger size and possibly the femoral component may need to be revised to a PS component.  We will get a bone scan to make sure there is no loosening inflammatory labs to rule out infection.  We will have her come back after the bone scan.  I can tell that the knee is causing her a fair amount of pain and distress and depression to her life.  Her recent A1c was 8.6 which will need to be corrected prior to undergoing revision surgery.  Total face to face encounter time was greater than 25 minutes and over half of this time was spent in counseling and/or coordination of care.  Follow-Up Instructions: Return for After bone scan.   Orders:  Orders Placed This Encounter  Procedures   XR Knee 1-2 Views Left   NM Bone Scan 3 Phase   C-reactive protein   Sed Rate (ESR)   CBC   No orders of the defined types were placed in this encounter.     Procedures: No procedures performed   Clinical Data: No additional findings.   Subjective: Chief Complaint  Patient presents with   Left Knee - Pain    Ms. Mitcheltree is a 60 year old female who comes in for evaluation of chronic left knee pain for about 10 years.  She had a knee replacement at that time by a colleague of mine in this community.  Unfortunately she never felt pain relief from the surgery.  She states she continues to have pain throughout the knee and it feels like it wants to give out on her therefore she has not been able to do much  walking because she is afraid of falling.  She drags her feet because the knee feels unstable.  She is currently using a single-point cane to help with ambulation.  The chronic pain has caused significant depression.  She has taken gabapentin for about 5 years for the pain.  She did not have any postoperative complications.   Review of Systems  Constitutional: Negative.   HENT: Negative.    Eyes: Negative.   Respiratory: Negative.    Cardiovascular: Negative.   Endocrine: Negative.   Musculoskeletal: Negative.   Neurological: Negative.   Hematological: Negative.   Psychiatric/Behavioral: Negative.    All other systems reviewed and are negative.   Objective: Vital Signs: There were no vitals taken for this visit.  Physical Exam Vitals and nursing note reviewed.  Constitutional:      Appearance: She is well-developed.  HENT:     Head: Normocephalic and atraumatic.  Pulmonary:     Effort: Pulmonary effort is normal.  Abdominal:     Palpations: Abdomen is soft.  Musculoskeletal:     Cervical back: Neck supple.  Skin:    General: Skin is warm.     Capillary Refill:  Capillary refill takes less than 2 seconds.  Neurological:     Mental Status: She is alert and oriented to person, place, and time.  Psychiatric:        Behavior: Behavior normal.        Thought Content: Thought content normal.        Judgment: Judgment normal.    Ortho Exam Left knee shows a fully healed surgical scar.  Range of motion is appropriate.  She has moderate laxity to varus valgus stress as well as to anterior drawer and Lachman test.  There is a small joint effusion. Specialty Comments:  No specialty comments available.  Imaging: XR Knee 1-2 Views Left  Result Date: 07/21/2020 Status post left total knee replacement with a long stemmed tibial component.  There is no obvious evidence of loosening of implants.    PMFS History: Patient Active Problem List   Diagnosis Date Noted   Ulnar  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 of left upper extremity 08/09/2019   Pincer nail deformity 05/19/2019   Excessive daytime sleepiness 03/15/2019   Type 2 diabetes mellitus with hyperglycemia, with long-term current use of insulin ([place]) 03/08/2019   Type 2 diabetes mellitus with diabetic polyneuropathy, with long-term current use of insulin ([place]) 03/08/2019   Diabetic neuropathy with neurologic complication ([place]) 07/86/7544   Claudication of both lower extremities ([place]) 02/04/2019   RLS (restless legs syndrome) 02/04/2019   Sleep deprivation 02/04/2019   Hot flashes 02/04/2019   Neck pain 02/04/2019   Chronic migraine without aura, with intractable migraine, so stated, with status migrainosus 01/27/2019   Acute pain of both knees 06/18/2018   Radiculopathy 02/11/2018   Bipolar disorder ([place]) 03/29/2016   Diabetic ketoacidosis ([place]) 03/24/2016   Dermatitis 03/24/2016   HTN (hypertension) 03/24/2016   HLD (hyperlipidemia) 03/24/2016   DM (diabetes mellitus) ([place]) 03/24/2016   Chronic daily headache 11/23/2012   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   Depression 11/23/2012   Past Medical History:  Diagnosis Date   Allergy    Anemia    Anxiety    Arthritis    Back pain    Bipolar disorder (HCC)    CVA (cerebral infarction)    Depression    Diabetes mellitus without complication (HCC)    High cholesterol    Hypertension    Left leg pain    Migraine    Sleep apnea    Stroke ([place])     Family History  Problem Relation Age of Onset   Diabetes Mother    Stroke Father    Cancer Paternal Aunt    Cancer Paternal Aunt    Diabetes Brother    Colon cancer Neg Hx    Esophageal cancer Neg Hx    Rectal cancer Neg Hx    Stomach cancer Neg Hx     Past Surgical History:  Procedure Laterality Date   ABDOMINAL HYSTERECTOMY     ANTERIOR LATERAL LUMBAR FUSION WITH PERCUTANEOUS SCREW 1 LEVEL Left 02/11/2018   Procedure: LEFT LATERAL LUMBAR  FOUR-FIVE INTERBODY FUSION WITH  INSTRUMENTATION AND ALLOGRAFT;  Surgeon: Phylliss Bob, MD;  Location: [facility];  Service: Orthopedics;  Laterality: Left;  LEFT LATERAL LUMBAR  FOUR-FIVE INTERBODY FUSION WITH INSTRUMENTATION AND ALLOGRAFT   BACK SURGERY     Lumbar fusion L5-S1   DIABETES     EYE SURGERY Bilateral    KNEE SURGERY     l 4-l5 status post lateral posterior fusion January 30,2020     NEUROFORAMINAL STENOSIS Left  Severe L4-L5, involving the level above previous fusion.   PARTIAL HYSTERECTOMY     RADICULOPATHY Left    L4, Secondary to an L4-L5 Spondylolisthesis   SHOULDER SURGERY     Social History   Occupational History   Not on file  Tobacco Use   Smoking status: Former    Pack years: 0.00    Types: Cigarettes    Quit date: 12/13/2015    Years since quitting: 4.6   Smokeless tobacco: Never  Vaping Use   Vaping Use: Never used  Substance and Sexual Activity   Alcohol use: Yes    Comment: Occas   Drug use: Not Currently    Types: Marijuana    Comment: last weed use 26 Dec 2018   Sexual activity: Not Currently    Comment: 1st intercourse 60 yo-Fewer than 5 partners

## 2020-07-22 LAB — CBC
HCT: 37.8 % (ref 35.0–45.0)
Hemoglobin: 12.1 g/dL (ref 11.7–15.5)
MCH: 28 pg (ref 27.0–33.0)
MCHC: 32 g/dL (ref 32.0–36.0)
MCV: 87.5 fL (ref 80.0–100.0)
MPV: 10.6 fL (ref 7.5–12.5)
Platelets: 270 10*3/uL (ref 140–400)
RBC: 4.32 10*6/uL (ref 3.80–5.10)
RDW: 13.9 % (ref 11.0–15.0)
WBC: 13.3 10*3/uL — ABNORMAL HIGH (ref 3.8–10.8)

## 2020-07-22 LAB — SEDIMENTATION RATE: Sed Rate: 34 mm/h — ABNORMAL HIGH (ref 0–30)

## 2020-07-22 LAB — C-REACTIVE PROTEIN: CRP: 14.6 mg/L — ABNORMAL HIGH (ref <8.0)

## 2020-07-25 ENCOUNTER — Other Ambulatory Visit: Payer: Self-pay

## 2020-07-25 ENCOUNTER — Ambulatory Visit (INDEPENDENT_AMBULATORY_CARE_PROVIDER_SITE_OTHER): Payer: Medicare HMO | Admitting: Podiatry

## 2020-07-25 ENCOUNTER — Ambulatory Visit: Payer: Medicare HMO | Attending: Podiatry

## 2020-07-25 DIAGNOSIS — M722 Plantar fascial fibromatosis: Secondary | ICD-10-CM

## 2020-07-25 DIAGNOSIS — M2141 Flat foot [pes planus] (acquired), right foot: Secondary | ICD-10-CM | POA: Diagnosis not present

## 2020-07-25 DIAGNOSIS — M5441 Lumbago with sciatica, right side: Secondary | ICD-10-CM | POA: Diagnosis not present

## 2020-07-25 DIAGNOSIS — G8929 Other chronic pain: Secondary | ICD-10-CM | POA: Diagnosis not present

## 2020-07-25 DIAGNOSIS — M2142 Flat foot [pes planus] (acquired), left foot: Secondary | ICD-10-CM

## 2020-07-25 DIAGNOSIS — M5442 Lumbago with sciatica, left side: Secondary | ICD-10-CM | POA: Insufficient documentation

## 2020-07-25 DIAGNOSIS — M25572 Pain in left ankle and joints of left foot: Secondary | ICD-10-CM | POA: Diagnosis not present

## 2020-07-25 DIAGNOSIS — M76822 Posterior tibial tendinitis, left leg: Secondary | ICD-10-CM | POA: Insufficient documentation

## 2020-07-25 DIAGNOSIS — R262 Difficulty in walking, not elsewhere classified: Secondary | ICD-10-CM | POA: Insufficient documentation

## 2020-07-25 DIAGNOSIS — M6281 Muscle weakness (generalized): Secondary | ICD-10-CM | POA: Insufficient documentation

## 2020-07-25 DIAGNOSIS — M546 Pain in thoracic spine: Secondary | ICD-10-CM | POA: Diagnosis not present

## 2020-07-25 NOTE — Therapy (Signed)
[place], [place], [address] Phone: [phone]   Fax:  [phone]  Physical Therapy Treatment  Patient Details  Name: Autumn Valenzuela MRN: [id number] Date of Birth: [birth year] Referring Provider (PT): [name], DPM``````````````````````````````````````````````````````````````````````````````````````````````````   Encounter Date: 07/25/2020   PT End of Session - 07/25/20 0804     Visit Number 6    Number of Visits 13    Date for PT Re-Evaluation 08/19/20    Authorization Type [employer]ET[employer] MEDICARE HMO/PPO    Progress Note Due on Visit 10    PT Start Time 0803    PT Stop Time 0844    PT Time Calculation (min) 41 min    Equipment Utilized During Treatment Other (comment)    Activity Tolerance Patient tolerated treatment well             Past Medical History:  Diagnosis Date   Allergy    Anemia    Anxiety    Arthritis    Back pain    Bipolar disorder (HCC)    CVA (cerebral infarction)    Depression    Diabetes mellitus without complication (HCC)    High cholesterol    Hypertension    Left leg pain    Migraine    Sleep apnea    Stroke [facility])     Past Surgical History:  Procedure Laterality Date   ABDOMINAL HYSTERECTOMY     ANTERIOR LATERAL LUMBAR FUSION WITH PERCUTANEOUS SCREW 1 LEVEL Left 02/11/2018   Procedure: LEFT LATERAL LUMBAR  FOUR-FIVE INTERBODY FUSION WITH INSTRUMENTATION AND ALLOGRAFT;  Surgeon: Phylliss Bob, MD;  Location: [place];  Service: Orthopedics;  Laterality: Left;  LEFT LATERAL LUMBAR  FOUR-FIVE INTERBODY FUSION WITH INSTRUMENTATION AND ALLOGRAFT   BACK SURGERY     Lumbar fusion L5-S1   DIABETES     EYE SURGERY Bilateral    KNEE SURGERY     l 4-l5 status post lateral posterior fusion January 30,2020     NEUROFORAMINAL STENOSIS Left    Severe L4-L5, involving the level above previous fusion.   PARTIAL HYSTERECTOMY     RADICULOPATHY Left    L4, Secondary to  an L4-L5 Spondylolisthesis   SHOULDER SURGERY      There were no vitals filed for this visit.   Subjective Assessment - 07/25/20 0807     Subjective I'm starting to develop joint pain with the R UE amd LE as well. Pt staetes she saw an ortho MD about her knee and she did blood work and is to have a bone density test on 7/20. Pt reports she continues to be active to take care of her family.    Currently in Pain? Yes    Pain Score 8     Pain Location Ankle    Pain Orientation Left;Anterior;Lateral    Pain Descriptors / Indicators Aching    Pain Type Chronic pain    Pain Onset More than a month ago    Pain Frequency Constant                               [facility] Adult PT Treatment/Exercise - 07/25/20 0001       Knee/Hip Exercises: Standing   Hip Flexion Right;Left;10 reps;Knee straight;2 sets    Hip Abduction Right;Left;10 reps;2 sets    Hip Extension Right;Left;10 reps;2 sets    Lateral Step Up Right;Left;10 reps;Hand Hold: 1    Wall  Squat 5 reps      Knee/Hip Exercises: Seated   Long Arc Quad Right;Left;2 sets;10 reps    Long Arc Quad Weight 3 lbs.      Ankle Exercises: Standing   Heel Raises Both;10 reps   2 sets   Toe Raise 10 reps   2 sets     Ankle Exercises: Stretches   Gastroc Stretch 3 reps;2 reps;20 seconds   L and R                     PT Short Term Goals - 07/25/20 0841       PT SHORT TERM GOAL #1   Title Pt will be ind in an initial HEP    Baseline Started on eval    Time 3    Period Weeks    Status Achieved    Target Date 07/25/20      PT SHORT TERM GOAL #2   Title Pt will voice understanding of measures to assist in the management and reduction of L ankle/foot pain. 07/25/20: Pt reports she uses a variety of methods to mange her pain: RICE, heat, epson salt bath, tennis bal and roller massage, and a massage gun.    Time 3    Period Weeks    Status Achieved    Target Date 07/25/20               PT Long Term  Goals - 07/07/20 0828       PT LONG TERM GOAL #1   Title Pt will be Ind in an advnced HEP to maintain or progress achived LOF    Status New      PT LONG TERM GOAL #2   Title Pt's FOTO score will improve to the predicted value of 40% functional ability    Baseline 36% functional ability    Status New      PT LONG TERM GOAL #3   Title Pt will report a decrease in her L ankle/foot range of pain to 3-7/10 with daily activities including prolonged walking associated with shopping and cutting the grass    Baseline 4-10/10    Status New      PT LONG TERM GOAL #4   Title The patient will be able to ambulate 500 feet in the 6 minute walk test with Borg at 4.    Baseline 408 Borg at 6    Time 8    Period Weeks    Status New    Target Date 08/19/20                   Plan - 07/25/20 0805     Clinical Impression Statement Pt was completed for ankle stretching and strengthening for both LEs (hips, knees, and ankles) to optimize her function. Set STGs have been met for understanding of a HEP and for measures to assist with pain management. Pain for L the L LE continues to be elevated with the joints or the R UE and LE starting to bother her as well. Pt reports PT is helping her to improve her ability to function.    Personal Factors and Comorbidities Comorbidity 1;Comorbidity 3+;Comorbidity 2    Comorbidities DM, CVA, arthritis, Hx of L knee TKR    Examination-Activity Limitations Locomotion Level;Stand;Squat    Examination-Participation Restrictions Other    Stability/Clinical Decision Making Evolving/Moderate complexity    Clinical Decision Making Moderate    Rehab Potential Good    PT Frequency  2x / week    PT Duration 6 weeks    PT Treatment/Interventions ADLs/Self Care Home Management;Cryotherapy;Electrical Stimulation;Iontophoresis 57m/ml Dexamethasone;Moist Heat;Ultrasound;Therapeutic exercise;Gait training;Stair training;Functional mobility training;Patient/family  education;Manual techniques;Joint Manipulations;Taping;Passive range of motion    PT Next Visit Plan Progress therex for L ankle as inidicated, core stabilization, hip strengthening, balance training. Assess balance.    PT Home Exercise Plan [id number]   Consulted and Agree with Plan of Care Patient             Patient will benefit from skilled therapeutic intervention in order to improve the following deficits and impairments:  Difficulty walking, Decreased activity tolerance, Pain, Decreased strength  Visit Diagnosis: Posterior tibial tendinitis of left leg  Pain in left ankle and joints of left foot  Muscle weakness (generalized)  Difficulty in walking, not elsewhere classified     Problem List Patient Active Problem List   Diagnosis Date Noted   Ulnar 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 of left upper extremity 08/09/2019   Pincer nail deformity 05/19/2019   Excessive daytime sleepiness 03/15/2019   Type 2 diabetes mellitus with hyperglycemia, with long-term current use of insulin ([place] 03/08/2019   Type 2 diabetes mellitus with diabetic polyneuropathy, with long-term current use of insulin ([facility] 03/08/2019   Diabetic neuropathy with neurologic complication ([facility] 012/19/7588  Claudication of both lower extremities ([facility] 02/04/2019   RLS (restless legs syndrome) 02/04/2019   Sleep deprivation 02/04/2019   Hot flashes 02/04/2019   Neck pain 02/04/2019   Chronic migraine without aura, with intractable migraine, so stated, with status migrainosus 01/27/2019   Acute pain of both knees 06/18/2018   Radiculopathy 02/11/2018   Bipolar disorder ([facility] 03/29/2016   Diabetic ketoacidosis ([facility] 03/24/2016   Dermatitis 03/24/2016   HTN (hypertension) 03/24/2016   HLD (hyperlipidemia) 03/24/2016   DM (diabetes mellitus) ([place] 03/24/2016   Chronic daily headache 11/23/2012   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   Depression 11/23/2012    AGar PontoMS, PT 07/25/20  8:58 AM   [facility] [address]Phone: [phone]  Fax:  [phone] Name: Autumn EscutiaMRN: [id number] of Birth: 8Feb 04, 1962

## 2020-07-25 NOTE — Telephone Encounter (Signed)
Not sure.  She wants a handicap placard.  Or would it be best to have her wait till Autumn Valenzuela is back? Dm/cma

## 2020-07-25 NOTE — Progress Notes (Signed)
  Subjective:  Patient ID: Autumn Valenzuela, female    DOB: August 12, 1960,  MRN: [id number]  Chief Complaint  Patient presents with   Tendonitis     Return in about 6 weeks (around 07/25/2020) for re-check posterior tibial tendinitis    60 y.o. female returns for follow-up with the above complaint. History confirmed with patient.  Left side continues to worsen the brace has not been helpful and she is unable to do therapy exercises  Objective:  Physical Exam: warm, good capillary refill, no trophic changes or ulcerative lesions, normal DP and PT pulses and normal sensory exam. Left Foot: She has semi-reducible hammertoe contractures, pes planus, she has pain on palpation of the posterior tibial tendon along its course and at the insertion of the navicular, pain with resisted inversion , plantar heel pain on the insertion of the plantar fascia on the medial calcaneal tubercle Right Foot: Pes planus deformity, no pain on palpation today.  She is unable to do a single heel rise.   Radiographs: X-ray of both feet: no fracture, dislocation, swelling or degenerative changes noted and pes planus Assessment:   1. Posterior tibial tendinitis of left leg   2. Plantar fasciitis   3. Pes planus of both feet      Plan:  Patient was evaluated and treated and all questions answered.  Discussed the etiology and treatment options for posterior tibial tendinitis including stretching, formal physical therapy, supportive shoegears such as a running shoe or sneaker, pre fabricated orthoses, injection therapy, and oral medications. We also discussed the role of surgical treatment of this for patients who do not improve after exhausting non-surgical treatment options.  Continues to worsen.  Continue therapy and bracing for now.  MRI ordered to evaluate tendon for tearing and degenerative changes and possible surgical planning  Today she did have plantar heel pain on the insertion of the plantar fashion on  the medial calcaneal tubercle.  Recommended injection of this today with corticosteroid.  She is unable to take anti-inflammatories.  After sterile prep with povidone-iodine solution and alcohol, the left heel was injected with 0.5cc 2% xylocaine plain, 0.5cc 0.5% marcaine plain, 5mg  triamcinolone acetonide, and 2mg  dexamethasone was injected along the medial plantar fascia at the insertion on the plantar calcaneus. The patient tolerated the procedure well without complication.   Return in about 1 month (around 08/25/2020) for after MRI to review.

## 2020-07-26 ENCOUNTER — Ambulatory Visit: Payer: Medicare HMO | Admitting: Neurology

## 2020-07-26 ENCOUNTER — Telehealth: Payer: Self-pay | Admitting: Podiatry

## 2020-07-26 MED ORDER — MELOXICAM 15 MG PO TABS: 15.0000 mg | ORAL_TABLET | Freq: Every day | ORAL | 0 refills | Status: DC

## 2020-07-26 NOTE — Addendum Note (Signed)
Addended bySherryle Lis, Roena Sassaman R on: 07/26/2020 02:19 PM   Modules accepted: Orders

## 2020-07-26 NOTE — Telephone Encounter (Signed)
Pt called and stated her left foot is still in severe pain. Pt requested pain meds. Please advise   [facility] on [address]

## 2020-07-27 ENCOUNTER — Other Ambulatory Visit: Payer: Self-pay

## 2020-07-27 ENCOUNTER — Ambulatory Visit: Payer: Medicare HMO

## 2020-07-27 DIAGNOSIS — M76822 Posterior tibial tendinitis, left leg: Secondary | ICD-10-CM

## 2020-07-27 DIAGNOSIS — R262 Difficulty in walking, not elsewhere classified: Secondary | ICD-10-CM

## 2020-07-27 DIAGNOSIS — M25572 Pain in left ankle and joints of left foot: Secondary | ICD-10-CM

## 2020-07-27 DIAGNOSIS — M6281 Muscle weakness (generalized): Secondary | ICD-10-CM

## 2020-07-27 NOTE — Therapy (Signed)
[place], [place], [address] Phone: [phone]   Fax:  [phone]  Physical Therapy Treatment  Patient Details  Name: Autumn Valenzuela MRN: [id number] Date of Birth: 07/16/1960 Referring Provider (PT): [name], DPM``````````````````````````````````````````````````````````````````````````````````````````````````   Encounter Date: 07/27/2020   PT End of Session - 07/27/20 0825     Visit Number 7    Number of Visits 13    Date for PT Re-Evaluation 08/19/20    Authorization Type [employer] MEDICARE HMO/PPO    Progress Note Due on Visit 10    PT Start Time 0806    PT Stop Time 0846    PT Time Calculation (min) 40 min    Equipment Utilized During Treatment Other (comment)   L ankle brace   Activity Tolerance Patient tolerated treatment well    Behavior During Therapy [facility] for tasks assessed/performed             Past Medical History:  Diagnosis Date   Allergy    Anemia    Anxiety    Arthritis    Back pain    Bipolar disorder (HCC)    CVA (cerebral infarction)    Depression    Diabetes mellitus without complication (HCC)    High cholesterol    Hypertension    Left leg pain    Migraine    Sleep apnea    Stroke [facility])     Past Surgical History:  Procedure Laterality Date   ABDOMINAL HYSTERECTOMY     ANTERIOR LATERAL LUMBAR FUSION WITH PERCUTANEOUS SCREW 1 LEVEL Left 02/11/2018   Procedure: LEFT LATERAL LUMBAR  FOUR-FIVE INTERBODY FUSION WITH INSTRUMENTATION AND ALLOGRAFT;  Surgeon: Phylliss Bob, MD;  Location: [place];  Service: Orthopedics;  Laterality: Left;  LEFT LATERAL LUMBAR  FOUR-FIVE INTERBODY FUSION WITH INSTRUMENTATION AND ALLOGRAFT   BACK SURGERY     Lumbar fusion L5-S1   DIABETES     EYE SURGERY Bilateral    KNEE SURGERY     l 4-l5 status post lateral posterior fusion January 30,2020     NEUROFORAMINAL STENOSIS Left    Severe L4-L5, involving the level above previous  fusion.   PARTIAL HYSTERECTOMY     RADICULOPATHY Left    L4, Secondary to an L4-L5 Spondylolisthesis   SHOULDER SURGERY      There were no vitals filed for this visit.   Subjective Assessment - 07/27/20 0811     Subjective Pt reports receiving a cortisone shot in her L heel 2 days ago which usually helps in the long run, but it is still sore. Pt reports her L ankle feels stronger and more stable, but her pain level is not improved.    Pertinent History DM, CVA,arthritis, L knee TKR    How long can you stand comfortably? 15 mins    How long can you walk comfortably? 5 mins    Patient Stated Goals To be comfortable with shopping and caring for my grandkids    Currently in Pain? Yes    Pain Score 7     Pain Location Ankle    Pain Orientation Left;Anterior;Lateral    Pain Descriptors / Indicators Aching    Pain Type Chronic pain    Pain Onset More than a month ago    Pain Frequency Constant    Aggravating Factors  standing and walking; cutting grass    Pain Relieving Factors epson salt baths, tylenol, rubbing it  [facility] PT Assessment - 07/27/20 0001       Observation/Other Assessments   Focus on Therapeutic Outcomes (FOTO)  36%                           [facility] Adult PT Treatment/Exercise - 07/27/20 0001       Ambulation/Gait   Gait Comments Pt walks at a decreased pace, but with a heel/toe pattern and equal step length      Knee/Hip Exercises: Supine   Hip Adduction Isometric Both;2 sets;10 reps    Bridges Both;2 sets;10 reps    Other Supine Knee/Hip Exercises Clams c green Tband, 10x2      Ankle Exercises: Aerobic   Stationary Bike 5 mins; L1      Ankle Exercises: Supine   T-Band 4 way RTB 10x2                      PT Short Term Goals - 07/25/20 0841       PT SHORT TERM GOAL #1   Title Pt will be ind in an initial HEP    Baseline Started on eval    Time 3    Period Weeks    Status Achieved    Target Date  07/25/20      PT SHORT TERM GOAL #2   Title Pt will voice understanding of measures to assist in the management and reduction of L ankle/foot pain. 07/25/20: Pt reports she uses a variety of methods to mange her pain: RICE, heat, epson salt bath, tennis bal and roller massage, and a massage gun.    Time 3    Period Weeks    Status Achieved    Target Date 07/25/20               PT Long Term Goals - 07/27/20 1421       PT LONG TERM GOAL #2   Title Pt's FOTO score will improve to the predicted value of 40% functional ability.07/27/20: 36% functional ability    Baseline 36% functional ability    Status On-going    Target Date 08/19/20                   Plan - 07/27/20 0825     Clinical Impression Statement Re-assess of pt's FOTO revealed pt's perceived level of function to be the same as on the initial eval. Pt reports her L ankle feels stronger and more stable, but the pain is the same making functional activities difficult. Pt indicates she does what she needs to do for her family even when she is experiencing pain Pt's walks at a decreased pace, but with a heel to toe gait pattern and equal step lengths.    Personal Factors and Comorbidities Comorbidity 1;Comorbidity 3+;Comorbidity 2    Comorbidities DM, CVA, arthritis, Hx of L knee TKR    Examination-Activity Limitations Locomotion Level;Stand;Squat    Examination-Participation Restrictions Other    Stability/Clinical Decision Making Evolving/Moderate complexity    Clinical Decision Making Moderate    Rehab Potential Good    PT Frequency 2x / week    PT Duration 6 weeks    PT Treatment/Interventions ADLs/Self Care Home Management;Cryotherapy;Electrical Stimulation;Iontophoresis 4mg /ml Dexamethasone;Moist Heat;Ultrasound;Therapeutic exercise;Gait training;Stair training;Functional mobility training;Patient/family education;Manual techniques;Joint Manipulations;Taping;Passive range of motion    PT Next Visit Plan Progress  therex for L ankle as inidicated, hip strengthening, balance training. Assess balance.    PT Home Exercise  Plan [id number]    Consulted and Agree with Plan of Care Patient             Patient will benefit from skilled therapeutic intervention in order to improve the following deficits and impairments:  Difficulty walking, Decreased activity tolerance, Pain, Decreased strength  Visit Diagnosis: Posterior tibial tendinitis of left leg  Pain in left ankle and joints of left foot  Muscle weakness (generalized)  Difficulty in walking, not elsewhere classified     Problem List Patient Active Problem List   Diagnosis Date Noted   Ulnar 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 of left upper extremity 08/09/2019   Pincer nail deformity 05/19/2019   Excessive daytime sleepiness 03/15/2019   Type 2 diabetes mellitus with hyperglycemia, with long-term current use of insulin ([place]) 03/08/2019   Type 2 diabetes mellitus with diabetic polyneuropathy, with long-term current use of insulin ([place]) 03/08/2019   Diabetic neuropathy with neurologic complication ([place]) 09/32/3557   Claudication of both lower extremities ([place]) 02/04/2019   RLS (restless legs syndrome) 02/04/2019   Sleep deprivation 02/04/2019   Hot flashes 02/04/2019   Neck pain 02/04/2019   Chronic migraine without aura, with intractable migraine, so stated, with status migrainosus 01/27/2019   Acute pain of both knees 06/18/2018   Radiculopathy 02/11/2018   Bipolar disorder ([place]) 03/29/2016   Diabetic ketoacidosis ([place]) 03/24/2016   Dermatitis 03/24/2016   HTN (hypertension) 03/24/2016   HLD (hyperlipidemia) 03/24/2016   DM (diabetes mellitus) ([place] 03/24/2016   Chronic daily headache 11/23/2012   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   Depression 11/23/2012    Gar Ponto MS, PT 07/27/20 2:48 PM   [facility] [address] [place], [place], [address] Phone:  [phone]   Fax:  [phone]  Name: Autumn Valenzuela MRN: [id number] Date of Birth: 05-15-1960

## 2020-08-01 ENCOUNTER — Encounter (HOSPITAL_COMMUNITY): Payer: Medicare HMO

## 2020-08-01 ENCOUNTER — Encounter (HOSPITAL_COMMUNITY): Admission: RE | Admit: 2020-08-01 | Payer: Medicare HMO | Source: Ambulatory Visit

## 2020-08-01 ENCOUNTER — Other Ambulatory Visit: Payer: Self-pay

## 2020-08-01 ENCOUNTER — Ambulatory Visit: Payer: Medicare HMO

## 2020-08-01 DIAGNOSIS — M5442 Lumbago with sciatica, left side: Secondary | ICD-10-CM | POA: Diagnosis not present

## 2020-08-01 DIAGNOSIS — M5441 Lumbago with sciatica, right side: Secondary | ICD-10-CM | POA: Diagnosis not present

## 2020-08-01 DIAGNOSIS — M25572 Pain in left ankle and joints of left foot: Secondary | ICD-10-CM | POA: Diagnosis not present

## 2020-08-01 DIAGNOSIS — M546 Pain in thoracic spine: Secondary | ICD-10-CM | POA: Diagnosis not present

## 2020-08-01 DIAGNOSIS — G8929 Other chronic pain: Secondary | ICD-10-CM | POA: Diagnosis not present

## 2020-08-01 DIAGNOSIS — M6281 Muscle weakness (generalized): Secondary | ICD-10-CM

## 2020-08-01 DIAGNOSIS — R262 Difficulty in walking, not elsewhere classified: Secondary | ICD-10-CM | POA: Diagnosis not present

## 2020-08-01 DIAGNOSIS — M76822 Posterior tibial tendinitis, left leg: Secondary | ICD-10-CM | POA: Diagnosis not present

## 2020-08-01 NOTE — Telephone Encounter (Signed)
Please review and advise on getting a handicap placard.   Thanks. Dm/cma

## 2020-08-01 NOTE — Therapy (Signed)
[place], [place], [address] Phone: [phone]   Fax:  [phone]  Physical Therapy Treatment  Patient Details  Name: Autumn Valenzuela MRN: [id number] Date of Birth: 06/17/1960 Referring Provider (PT): [name], DPM``````````````````````````````````````````````````````````````````````````````````````````````````   Encounter Date: 08/01/2020   PT End of Session - 08/01/20 0816     Visit Number 8    Number of Visits 13    Date for PT Re-Evaluation 08/19/20    Authorization Type [employer] MEDICARE HMO/PPO    Progress Note Due on Visit 10    PT Start Time 0803    PT Stop Time 0844    PT Time Calculation (min) 41 min    Equipment Utilized During Treatment Other (comment)    Activity Tolerance Patient tolerated treatment well    Behavior During Therapy [facility] for tasks assessed/performed             Past Medical History:  Diagnosis Date   Allergy    Anemia    Anxiety    Arthritis    Back pain    Bipolar disorder (HCC)    CVA (cerebral infarction)    Depression    Diabetes mellitus without complication (HCC)    High cholesterol    Hypertension    Left leg pain    Migraine    Sleep apnea    Stroke [facility])     Past Surgical History:  Procedure Laterality Date   ABDOMINAL HYSTERECTOMY     ANTERIOR LATERAL LUMBAR FUSION WITH PERCUTANEOUS SCREW 1 LEVEL Left 02/11/2018   Procedure: LEFT LATERAL LUMBAR  FOUR-FIVE INTERBODY FUSION WITH INSTRUMENTATION AND ALLOGRAFT;  Surgeon: Phylliss Bob, MD;  Location: [place];  Service: Orthopedics;  Laterality: Left;  LEFT LATERAL LUMBAR  FOUR-FIVE INTERBODY FUSION WITH INSTRUMENTATION AND ALLOGRAFT   BACK SURGERY     Lumbar fusion L5-S1   DIABETES     EYE SURGERY Bilateral    KNEE SURGERY     l 4-l5 status post lateral posterior fusion January 30,2020     NEUROFORAMINAL STENOSIS Left    Severe L4-L5, involving the level above previous fusion.    PARTIAL HYSTERECTOMY     RADICULOPATHY Left    L4, Secondary to an L4-L5 Spondylolisthesis   SHOULDER SURGERY      There were no vitals filed for this visit.   Subjective Assessment - 08/01/20 0806     Subjective My heel and lateral ankle are better since the injection last week.    Patient Stated Goals To be comfortable with shopping and caring for my grandkids    Currently in Pain? Yes    Pain Score 6     Pain Location Ankle    Pain Descriptors / Indicators Aching    Pain Type Chronic pain    Pain Onset More than a month ago    Pain Frequency Constant                               [facility] Adult PT Treatment/Exercise - 08/01/20 0001       Knee/Hip Exercises: Standing   Knee Flexion Right;Left;15 reps    Knee Flexion Limitations 2 hand assist    Hip Flexion Right;Left;15 reps    Hip Flexion Limitations 2 hand assist    Hip Abduction Left;15 reps    Abduction Limitations 2 hand assist    Hip Extension Right;Left    Extension Limitations 2  hand assist    Lateral Step Up Right;Left;15 reps;Hand Hold: 1    Lateral Step Up Limitations --    Rocker Board 1 minute      Modalities   Modalities Iontophoresis      Iontophoresis   Type of Iontophoresis Dexamethasone    Location L lateral ankle    Dose 4mg /ml; 1 ml      Manual Therapy   Manual Therapy Soft tissue mobilization    Soft tissue mobilization Gentle STM to the L calf and ligth cross friction massage to the lateral ankle (peroneal tendons)      Ankle Exercises: Aerobic   Stationary Bike 5 mins; L1                      PT Short Term Goals - 07/25/20 0841       PT SHORT TERM GOAL #1   Title Pt will be ind in an initial HEP    Baseline Started on eval    Time 3    Period Weeks    Status Achieved    Target Date 07/25/20      PT SHORT TERM GOAL #2   Title Pt will voice understanding of measures to assist in the management and reduction of L ankle/foot pain. 07/25/20: Pt reports  she uses a variety of methods to mange her pain: RICE, heat, epson salt bath, tennis bal and roller massage, and a massage gun.    Time 3    Period Weeks    Status Achieved    Target Date 07/25/20               PT Long Term Goals - 07/27/20 1421       PT LONG TERM GOAL #2   Title Pt's FOTO score will improve to the predicted value of 40% functional ability.07/27/20: 36% functional ability    Baseline 36% functional ability    Status On-going    Target Date 08/19/20                   Plan - 08/01/20 2694     Clinical Impression Statement Pt reports a reduction in her L heel and medial ankle pain following an injection from the podiatrist. PT continues to ankle bilat LE strength and balance. Gentle STM was completed for the L calf and light cross friction massge to the peroneal tendons. Adiitonally, iontophoresis was applied to the L lateral ankle to address pain. Pt tolerated the session without adverse effects.    Personal Factors and Comorbidities Comorbidity 1;Comorbidity 3+;Comorbidity 2;Fitness    Comorbidities DM, CVA, arthritis, Hx of L knee TKR    Examination-Activity Limitations Locomotion Level;Stand;Squat    Examination-Participation Restrictions Other    Stability/Clinical Decision Making Evolving/Moderate complexity    Clinical Decision Making Moderate    Rehab Potential Good    PT Frequency 2x / week    PT Duration 6 weeks    PT Treatment/Interventions ADLs/Self Care Home Management;Cryotherapy;Electrical Stimulation;Iontophoresis 4mg /ml Dexamethasone;Moist Heat;Ultrasound;Therapeutic exercise;Gait training;Stair training;Functional mobility training;Patient/family education;Manual techniques;Joint Manipulations;Taping;Passive range of motion    PT Next Visit Plan Progress therex for L ankle as inidicated, hip strengthening, balance training. Assess balance/function.    PT Home Exercise Plan [id number]    Consulted and Agree with Plan of Care Patient              Patient will benefit from skilled therapeutic intervention in order to improve the following deficits and impairments:  Difficulty walking,  Decreased activity tolerance, Pain, Decreased strength  Visit Diagnosis: Posterior tibial tendinitis of left leg  Pain in left ankle and joints of left foot  Muscle weakness (generalized)  Difficulty in walking, not elsewhere classified     Problem List Patient Active Problem List   Diagnosis Date Noted   Ulnar 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 of left upper extremity 08/09/2019   Pincer nail deformity 05/19/2019   Excessive daytime sleepiness 03/15/2019   Type 2 diabetes mellitus with hyperglycemia, with long-term current use of insulin ([place]) 03/08/2019   Type 2 diabetes mellitus with diabetic polyneuropathy, with long-term current use of insulin ([place]) 03/08/2019   Diabetic neuropathy with neurologic complication ([place]) 99/14/4458   Claudication of both lower extremities ([place]) 02/04/2019   RLS (restless legs syndrome) 02/04/2019   Sleep deprivation 02/04/2019   Hot flashes 02/04/2019   Neck pain 02/04/2019   Chronic migraine without aura, with intractable migraine, so stated, with status migrainosus 01/27/2019   Acute pain of both knees 06/18/2018   Radiculopathy 02/11/2018   Bipolar disorder ([place]) 03/29/2016   Diabetic ketoacidosis ([place]) 03/24/2016   Dermatitis 03/24/2016   HTN (hypertension) 03/24/2016   HLD (hyperlipidemia) 03/24/2016   DM (diabetes mellitus) ([place]) 03/24/2016   Chronic daily headache 11/23/2012   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   Depression 11/23/2012   Gar Ponto MS, PT 08/01/20 9:31 AM   [facility] [address] [place], [place], [address] Phone: [phone]   Fax:  [phone]  Name: Autumn Valenzuela MRN: [id number] Date of Birth: [birth year]

## 2020-08-02 NOTE — Telephone Encounter (Signed)
Patient notified and verbalized understanding. 

## 2020-08-03 ENCOUNTER — Other Ambulatory Visit: Payer: Self-pay

## 2020-08-03 ENCOUNTER — Ambulatory Visit: Payer: Medicare HMO

## 2020-08-03 DIAGNOSIS — M546 Pain in thoracic spine: Secondary | ICD-10-CM | POA: Diagnosis not present

## 2020-08-03 DIAGNOSIS — M5442 Lumbago with sciatica, left side: Secondary | ICD-10-CM | POA: Diagnosis not present

## 2020-08-03 DIAGNOSIS — M25572 Pain in left ankle and joints of left foot: Secondary | ICD-10-CM | POA: Diagnosis not present

## 2020-08-03 DIAGNOSIS — M76822 Posterior tibial tendinitis, left leg: Secondary | ICD-10-CM | POA: Diagnosis not present

## 2020-08-03 DIAGNOSIS — G8929 Other chronic pain: Secondary | ICD-10-CM | POA: Diagnosis not present

## 2020-08-03 DIAGNOSIS — R262 Difficulty in walking, not elsewhere classified: Secondary | ICD-10-CM

## 2020-08-03 DIAGNOSIS — M5441 Lumbago with sciatica, right side: Secondary | ICD-10-CM | POA: Diagnosis not present

## 2020-08-03 DIAGNOSIS — M6281 Muscle weakness (generalized): Secondary | ICD-10-CM

## 2020-08-03 NOTE — Therapy (Signed)
[place], [place], [address] Phone: [phone]   Fax:  [phone]  Physical Therapy Treatment  Patient Details  Name: Autumn Valenzuela MRN: [id number] Date of Birth: 08-Sep-1960 Referring Provider (PT): [name], DPM``````````````````````````````````````````````````````````````````````````````````````````````````   Encounter Date: 08/03/2020   PT End of Session - 08/03/20 0843     Visit Number 9    Number of Visits 13    Date for PT Re-Evaluation 08/19/20    Authorization Type [employer] MEDICARE HMO/PPO    Progress Note Due on Visit 10    PT Start Time 0805    PT Stop Time 0847    PT Time Calculation (min) 42 min    Equipment Utilized During Treatment Other (comment)   knee and ankle brace   Activity Tolerance Patient tolerated treatment well    Behavior During Therapy [facility] for tasks assessed/performed             Past Medical History:  Diagnosis Date   Allergy    Anemia    Anxiety    Arthritis    Back pain    Bipolar disorder (HCC)    CVA (cerebral infarction)    Depression    Diabetes mellitus without complication (HCC)    High cholesterol    Hypertension    Left leg pain    Migraine    Sleep apnea    Stroke [facility])     Past Surgical History:  Procedure Laterality Date   ABDOMINAL HYSTERECTOMY     ANTERIOR LATERAL LUMBAR FUSION WITH PERCUTANEOUS SCREW 1 LEVEL Left 02/11/2018   Procedure: LEFT LATERAL LUMBAR  FOUR-FIVE INTERBODY FUSION WITH INSTRUMENTATION AND ALLOGRAFT;  Surgeon: Phylliss Bob, MD;  Location: [place];  Service: Orthopedics;  Laterality: Left;  LEFT LATERAL LUMBAR  FOUR-FIVE INTERBODY FUSION WITH INSTRUMENTATION AND ALLOGRAFT   BACK SURGERY     Lumbar fusion L5-S1   DIABETES     EYE SURGERY Bilateral    KNEE SURGERY     l 4-l5 status post lateral posterior fusion January 30,2020     NEUROFORAMINAL STENOSIS Left    Severe L4-L5, involving the level above  previous fusion.   PARTIAL HYSTERECTOMY     RADICULOPATHY Left    L4, Secondary to an L4-L5 Spondylolisthesis   SHOULDER SURGERY      There were no vitals filed for this visit.   Subjective Assessment - 08/03/20 0808     Subjective My ankle is doing good today, I haven't done too much yet. The patch was helpful and I've been using heat, ice, massage and the tennis ball massage for the bottom of my foot. Pt reports a pain range of 5-9/10 for the l ankle and foot.    Pertinent History DM, CVA,arthritis, L knee TKR    Patient Stated Goals To be comfortable with shopping and caring for my grandkids    Currently in Pain? Yes    Pain Score 6     Pain Location Ankle    Pain Orientation Left;Anterior;Lateral    Pain Descriptors / Indicators Aching    Pain Type Chronic pain    Pain Onset More than a month ago    Pain Frequency Constant                               [facility] Adult PT Treatment/Exercise - 08/03/20 0001       Knee/Hip Exercises: Supine  Straight Leg Raises Right;Left;2 sets      Manual Therapy   Manual Therapy Soft tissue mobilization    Soft tissue mobilization Gentle STM to the L calf and ligth cross friction massage to the lateral ankle (peroneal tendons)      Ankle Exercises: Seated   Towel Crunch --   20x   Towel Inversion/Eversion --   15x   Other Seated Ankle Exercises arch lifts, 20x      Ankle Exercises: Aerobic   Stationary Bike 5 mins; L1                      PT Short Term Goals - 07/25/20 0841       PT SHORT TERM GOAL #1   Title Pt will be ind in an initial HEP    Baseline Started on eval    Time 3    Period Weeks    Status Achieved    Target Date 07/25/20      PT SHORT TERM GOAL #2   Title Pt will voice understanding of measures to assist in the management and reduction of L ankle/foot pain. 07/25/20: Pt reports she uses a variety of methods to mange her pain: RICE, heat, epson salt bath, tennis bal and roller  massage, and a massage gun.    Time 3    Period Weeks    Status Achieved    Target Date 07/25/20               PT Long Term Goals - 08/03/20 0910       PT LONG TERM GOAL #3   Title Pt will report a decrease in her L ankle/foot range of pain to 3-7/10 with daily activities including prolonged walking associated with shopping and cutting the grass. 08/03/20-5-9/10    Baseline 4-10/10    Status On-going    Target Date 08/03/20                   Plan - 08/03/20 0836     Clinical Impression Statement PT was completed for L foot, and bilat knee, hip strengthening with the knee and hip strengthening for support of the L foot/ankle. Continued STM of the L calf and cross friction massage to the peroneal tendons. Pt's report of pain has not changed over course PT. Pt does report using various measures which help to manage her pain and stay active with the care of her family and home.    Personal Factors and Comorbidities Comorbidity 1;Comorbidity 3+;Comorbidity 2;Fitness    Comorbidities DM, CVA, arthritis, Hx of L knee TKR    Examination-Activity Limitations Locomotion Level;Stand;Squat    Examination-Participation Restrictions Other    Stability/Clinical Decision Making Evolving/Moderate complexity    Clinical Decision Making Moderate    Rehab Potential Good    PT Frequency 2x / week    PT Duration 6 weeks    PT Treatment/Interventions ADLs/Self Care Home Management;Cryotherapy;Electrical Stimulation;Iontophoresis '4mg'$ /ml Dexamethasone;Moist Heat;Ultrasound;Therapeutic exercise;Gait training;Stair training;Functional mobility training;Patient/family education;Manual techniques;Joint Manipulations;Taping;Passive range of motion    PT Next Visit Plan Progress therex for L ankle as inidicated, hip strengthening, balance training. Assess balance/function.    PT Home Exercise Plan [id number]    Consulted and Agree with Plan of Care Patient             Patient will benefit from  skilled therapeutic intervention in order to improve the following deficits and impairments:  Difficulty walking, Decreased activity tolerance, Pain, Decreased strength  Visit  Diagnosis: Posterior tibial tendinitis of left leg  Pain in left ankle and joints of left foot  Muscle weakness (generalized)  Difficulty in walking, not elsewhere classified  Pain in thoracic spine     Problem List Patient Active Problem List   Diagnosis Date Noted   Pain in thoracic spine 08/03/2020   Ulnar 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 of left upper extremity 08/09/2019   Pincer nail deformity 05/19/2019   Excessive daytime sleepiness 03/15/2019   Type 2 diabetes mellitus with hyperglycemia, with long-term current use of insulin ([place]) 03/08/2019   Type 2 diabetes mellitus with diabetic polyneuropathy, with long-term current use of insulin ([place]) 03/08/2019   Diabetic neuropathy with neurologic complication ([place]) [id number]   Claudication of both lower extremities ([place]) 02/04/2019   RLS (restless legs syndrome) 02/04/2019   Sleep deprivation 02/04/2019   Hot flashes 02/04/2019   Neck pain 02/04/2019   Chronic migraine without aura, with intractable migraine, so stated, with status migrainosus 01/27/2019   Acute pain of both knees 06/18/2018   Radiculopathy 02/11/2018   Bipolar disorder ([place]) 03/29/2016   Diabetic ketoacidosis ([place]) 03/24/2016   Dermatitis 03/24/2016   HTN (hypertension) 03/24/2016   HLD (hyperlipidemia) 03/24/2016   DM (diabetes mellitus) ([place]) 03/24/2016   Chronic daily headache 11/23/2012   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   Depression 11/23/2012    Gar Ponto MS, PT 08/03/20 9:11 AM   [facility] [address] [place], [place], [address] Phone: [phone]   Fax:  [phone]  Name: Autumn Valenzuela MRN: [id number] Date of Birth: [birth year]

## 2020-08-04 ENCOUNTER — Ambulatory Visit
Admission: RE | Admit: 2020-08-04 | Discharge: 2020-08-04 | Disposition: A | Discharge status: Home / Self Care | Payer: Medicare HMO | Source: Ambulatory Visit | Attending: Podiatry | Admitting: Podiatry

## 2020-08-04 ENCOUNTER — Encounter (HOSPITAL_COMMUNITY)
Admission: RE | Admit: 2020-08-04 | Discharge: 2020-08-04 | Disposition: A | Discharge status: Home / Self Care | Payer: Medicare HMO | Source: Ambulatory Visit | Attending: Orthopaedic Surgery | Admitting: Orthopaedic Surgery

## 2020-08-04 DIAGNOSIS — M2142 Flat foot [pes planus] (acquired), left foot: Secondary | ICD-10-CM

## 2020-08-04 DIAGNOSIS — M25562 Pain in left knee: Secondary | ICD-10-CM | POA: Insufficient documentation

## 2020-08-04 DIAGNOSIS — G8929 Other chronic pain: Secondary | ICD-10-CM | POA: Diagnosis not present

## 2020-08-04 DIAGNOSIS — M2141 Flat foot [pes planus] (acquired), right foot: Secondary | ICD-10-CM

## 2020-08-04 DIAGNOSIS — M1711 Unilateral primary osteoarthritis, right knee: Secondary | ICD-10-CM | POA: Diagnosis not present

## 2020-08-04 DIAGNOSIS — M25472 Effusion, left ankle: Secondary | ICD-10-CM | POA: Diagnosis not present

## 2020-08-04 DIAGNOSIS — M76822 Posterior tibial tendinitis, left leg: Secondary | ICD-10-CM

## 2020-08-04 DIAGNOSIS — M722 Plantar fascial fibromatosis: Secondary | ICD-10-CM

## 2020-08-04 DIAGNOSIS — M7732 Calcaneal spur, left foot: Secondary | ICD-10-CM | POA: Diagnosis not present

## 2020-08-04 MED ORDER — TECHNETIUM TC 99M MEDRONATE IV KIT
20.0000 | PACK | Freq: Once | INTRAVENOUS | Status: AC | PRN
  Administered 2020-08-04: 20.9 via INTRAVENOUS

## 2020-08-08 ENCOUNTER — Ambulatory Visit: Payer: Medicare HMO

## 2020-08-08 ENCOUNTER — Other Ambulatory Visit: Payer: Self-pay

## 2020-08-08 ENCOUNTER — Encounter: Payer: Self-pay | Admitting: Orthopaedic Surgery

## 2020-08-08 ENCOUNTER — Ambulatory Visit (INDEPENDENT_AMBULATORY_CARE_PROVIDER_SITE_OTHER): Payer: Medicare HMO | Admitting: Orthopaedic Surgery

## 2020-08-08 VITALS — Ht 61.0 in | Wt 205.0 lb

## 2020-08-08 DIAGNOSIS — M546 Pain in thoracic spine: Secondary | ICD-10-CM

## 2020-08-08 DIAGNOSIS — M25572 Pain in left ankle and joints of left foot: Secondary | ICD-10-CM | POA: Diagnosis not present

## 2020-08-08 DIAGNOSIS — M25562 Pain in left knee: Secondary | ICD-10-CM

## 2020-08-08 DIAGNOSIS — M6281 Muscle weakness (generalized): Secondary | ICD-10-CM

## 2020-08-08 DIAGNOSIS — G8929 Other chronic pain: Secondary | ICD-10-CM | POA: Diagnosis not present

## 2020-08-08 DIAGNOSIS — M5441 Lumbago with sciatica, right side: Secondary | ICD-10-CM | POA: Diagnosis not present

## 2020-08-08 DIAGNOSIS — M5442 Lumbago with sciatica, left side: Secondary | ICD-10-CM

## 2020-08-08 DIAGNOSIS — R262 Difficulty in walking, not elsewhere classified: Secondary | ICD-10-CM | POA: Diagnosis not present

## 2020-08-08 DIAGNOSIS — M76822 Posterior tibial tendinitis, left leg: Secondary | ICD-10-CM

## 2020-08-08 NOTE — Progress Notes (Signed)
Patient: Autumn Valenzuela           Date of Birth: 03-20-1960           MRN: [id number] Visit Date: 08/08/2020 PCP: Flossie Buffy, NP   Assessment & Plan:  Chief Complaint:  Chief Complaint  Patient presents with   Left Knee - Follow-up    Bone scan review   Visit Diagnoses:  1. Chronic pain of left knee     Plan: Autumn Valenzuela returns today for a bone scan review.  Continues to have symptoms of pain and instability with increase activity.  Left knee exam is unchanged.  Bone scan is consistent with loosening of the femoral and the tibial component.  Her inflammatory labs are elevated.  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  Knee replacement was performed by Dr. Eddie Dibbles in 2008.  I have recommended referral to either Dr. Mayer Camel or Dr. Lyla Glassing for further evaluation and treatment likely revision surgery.     Follow-Up Instructions: No follow-ups on file.   Orders:  No orders of the defined types were placed in this encounter.  No orders of the defined types were placed in this encounter.   Imaging: No results found.  PMFS History: Patient Active Problem List   Diagnosis Date Noted   Pain in thoracic spine 08/03/2020   Ulnar 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 of left upper extremity 08/09/2019   Pincer nail deformity 05/19/2019   Excessive daytime sleepiness 03/15/2019   Type 2 diabetes mellitus with hyperglycemia, with long-term current use of insulin ([place]) 03/08/2019   Type 2 diabetes mellitus with diabetic polyneuropathy, with long-term current use of insulin ([place]) 03/08/2019   Diabetic neuropathy with neurologic complication ([place]) [id number]   Claudication of both lower extremities ([place]) 02/04/2019   RLS (restless legs syndrome) 02/04/2019   Sleep deprivation 02/04/2019   Hot flashes 02/04/2019   Neck pain 02/04/2019   Chronic migraine without aura, with intractable migraine, so stated, with status migrainosus 01/27/2019   Acute  pain of both knees 06/18/2018   Radiculopathy 02/11/2018   Bipolar disorder ([place]) 03/29/2016   Diabetic ketoacidosis ([place]) 03/24/2016   Dermatitis 03/24/2016   HTN (hypertension) 03/24/2016   HLD (hyperlipidemia) 03/24/2016   DM (diabetes mellitus) ([place]) 03/24/2016   Chronic daily headache 11/23/2012   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   Depression 11/23/2012   Past Medical History:  Diagnosis Date   Allergy    Anemia    Anxiety    Arthritis    Back pain    Bipolar disorder (HCC)    CVA (cerebral infarction)    Depression    Diabetes mellitus without complication (HCC)    High cholesterol    Hypertension    Left leg pain    Migraine    Sleep apnea    Stroke ([place])     Family History  Problem Relation Age of Onset   Diabetes Mother    Stroke Father    Cancer Paternal Aunt    Cancer Paternal Aunt    Diabetes Brother    Colon cancer Neg Hx    Esophageal cancer Neg Hx    Rectal cancer Neg Hx    Stomach cancer Neg Hx     Past Surgical History:  Procedure Laterality Date   ABDOMINAL HYSTERECTOMY     ANTERIOR LATERAL LUMBAR FUSION WITH PERCUTANEOUS SCREW 1 LEVEL Left 02/11/2018   Procedure: LEFT LATERAL LUMBAR  FOUR-FIVE INTERBODY FUSION WITH INSTRUMENTATION  AND ALLOGRAFT;  Surgeon: Phylliss Bob, MD;  Location: [place];  Service: Orthopedics;  Laterality: Left;  LEFT LATERAL LUMBAR  FOUR-FIVE INTERBODY FUSION WITH INSTRUMENTATION AND ALLOGRAFT   BACK SURGERY     Lumbar fusion L5-S1   DIABETES     EYE SURGERY Bilateral    KNEE SURGERY     l 4-l5 status post lateral posterior fusion January 30,2020     NEUROFORAMINAL STENOSIS Left    Severe L4-L5, involving the level above previous fusion.   PARTIAL HYSTERECTOMY     RADICULOPATHY Left    L4, Secondary to an L4-L5 Spondylolisthesis   SHOULDER SURGERY     Social History   Occupational History   Not on file  Tobacco Use   Smoking status: Former    Types: Cigarettes    Quit date:  12/13/2015    Years since quitting: 4.6   Smokeless tobacco: Never  Vaping Use   Vaping Use: Never used  Substance and Sexual Activity   Alcohol use: Yes    Comment: Occas   Drug use: Not Currently    Types: Marijuana    Comment: last weed use 26 Dec 2018   Sexual activity: Not Currently    Comment: 1st intercourse 60 yo-Fewer than 5 partners

## 2020-08-08 NOTE — Addendum Note (Signed)
Addended by: Precious Bard on: 08/08/2020 02:28 PM   Modules accepted: Orders

## 2020-08-08 NOTE — Therapy (Signed)
[place], [place], [address] Phone: [phone]   Fax:  [phone]  Physical Therapy Treatment/[place] Note  Patient Details  Name: Autumn Valenzuela MRN: [id number] Date of Birth: [birth year] Referring Provider (PT): [name], DPM``````````````````````````````````````````````````````````````````````````````````````````````````  Progress Note Reporting Period 06/27/20 to 08/08/20  See note below for Objective Data and Assessment of Progress/Goals.      Encounter Date: 08/08/2020   PT End of Session - 08/08/20 0816     Visit Number 10    Number of Visits 13    Date for PT Re-Evaluation 08/19/20    Authorization Type AET[employer] MEDICARE HMO/PPO    Progress Note Due on Visit 10    PT Start Time 0806    PT Stop Time 0846    PT Time Calculation (min) 40 min    Activity Tolerance Patient tolerated treatment well    Behavior During Therapy WFL for tasks assessed/performed             Past Medical History:  Diagnosis Date   Allergy    Anemia    Anxiety    Arthritis    Back pain    Bipolar disorder (HCC)    CVA (cerebral infarction)    Depression    Diabetes mellitus without complication (HCC)    High cholesterol    Hypertension    Left leg pain    Migraine    Sleep apnea    Stroke [facility])     Past Surgical History:  Procedure Laterality Date   ABDOMINAL HYSTERECTOMY     ANTERIOR LATERAL LUMBAR FUSION WITH PERCUTANEOUS SCREW 1 LEVEL Left 02/11/2018   Procedure: LEFT LATERAL LUMBAR  FOUR-FIVE INTERBODY FUSION WITH INSTRUMENTATION AND ALLOGRAFT;  Surgeon: Phylliss Bob, MD;  Location: [place];  Service: Orthopedics;  Laterality: Left;  LEFT LATERAL LUMBAR  FOUR-FIVE INTERBODY FUSION WITH INSTRUMENTATION AND ALLOGRAFT   BACK SURGERY     Lumbar fusion L5-S1   DIABETES     EYE SURGERY Bilateral    KNEE SURGERY     l 4-l5 status post lateral posterior fusion January 30,2020      NEUROFORAMINAL STENOSIS Left    Severe L4-L5, involving the level above previous fusion.   PARTIAL HYSTERECTOMY     RADICULOPATHY Left    L4, Secondary to an L4-L5 Spondylolisthesis   SHOULDER SURGERY      There were no vitals filed for this visit.   Subjective Assessment - 08/08/20 0826     Subjective Today, is not a great day. Overll, I feel like I'm managing my pain better, so when I'm active, I tolerate it the activity better. I'm trying not to over do it.    Patient Stated Goals To be comfortable with shopping and caring for my grandkids    Currently in Pain? Yes    Pain Score 8     Pain Location Ankle    Pain Orientation Left;Anterior;Lateral    Pain Descriptors / Indicators Aching    Pain Onset More than a month ago    Pain Frequency Constant    Aggravating Factors  standing and walking; cutting grass    Pain Relieving Factors epson salt baths, tylenol, rubbing it                [facility] PT Assessment - 08/08/20 0001       Observation/Other Assessments   Focus on Therapeutic Outcomes (FOTO)  47%  [place] Adult PT Treatment/Exercise - 08/08/20 0001       Knee/Hip Exercises: Standing   Hip Flexion Right;Left;15 reps    Hip Flexion Limitations 2 hand assist    Hip Abduction Left;15 reps    Abduction Limitations 2 hand assist    Hip Extension Right;Left    Extension Limitations 2 hand assist    Lateral Step Up Right;Left;15 reps;Hand Hold: 1      Manual Therapy   Manual Therapy Soft tissue mobilization    Soft tissue mobilization Gentle STM to the L calf and ligth cross friction massage to the lateral ankle (peroneal tendons)      Ankle Exercises: Aerobic   Stationary Bike 5 mins; L1      Ankle Exercises: Seated   Towel Crunch --   20x   Towel Inversion/Eversion --   15x   Other Seated Ankle Exercises arch lifts, 20x                      PT Short Term Goals - 07/25/20 0841       PT SHORT TERM GOAL  #1   Title Pt will be ind in an initial HEP    Baseline Started on eval    Time 3    Period Weeks    Status Achieved    Target Date 07/25/20      PT SHORT TERM GOAL #2   Title Pt will voice understanding of measures to assist in the management and reduction of L ankle/foot pain. 07/25/20: Pt reports she uses a variety of methods to mange her pain: RICE, heat, epson salt bath, tennis bal and roller massage, and a massage gun.    Time 3    Period Weeks    Status Achieved    Target Date 07/25/20               PT Long Term Goals - 08/08/20 1322       PT LONG TERM GOAL #2   Title Pt's FOTO score will improve to the predicted value of 40% functional ability.07/27/20: 36% functional ability. 08/08/20: 47% functional mobility    Baseline 36% functional ability    Status On-going    Target Date 08/19/20                   Plan - 08/08/20 1321     Clinical Impression Statement Continued to PT to address L ankle foot pain through STM, cross friction massage, and ther exs for ROM and strengthening. Pt's re-assessment of her FOTO indicates pt's perceived level to complete functional activities is improved from 36% to 47%. Pt will continue to benefit from PT to address pain and deficits to optimize functional use of the L ankle.    Personal Factors and Comorbidities Comorbidity 1;Comorbidity 3+;Comorbidity 2;Fitness    Comorbidities DM, CVA, arthritis, Hx of L knee TKR    Examination-Activity Limitations Locomotion Level;Stand;Squat    Examination-Participation Restrictions Other   ADLs   Stability/Clinical Decision Making Evolving/Moderate complexity    Clinical Decision Making Moderate    Rehab Potential Good    PT Frequency 2x / week    PT Duration 6 weeks    PT Treatment/Interventions ADLs/Self Care Home Management;Cryotherapy;Electrical Stimulation;Iontophoresis '4mg'$ /ml Dexamethasone;Moist Heat;Ultrasound;Therapeutic exercise;Gait training;Stair training;Functional mobility  training;Patient/family education;Manual techniques;Joint Manipulations;Taping;Passive range of motion    PT Next Visit Plan Progress therex for L ankle as inidicated, hip strengthening, balance training. Assess balance/function.    PT Home Exercise Plan  [id number]    Consulted and Agree with Plan of Care Patient             Patient will benefit from skilled therapeutic intervention in order to improve the following deficits and impairments:  Difficulty walking, Decreased activity tolerance, Pain, Decreased strength  Visit Diagnosis: Posterior tibial tendinitis of left leg  Pain in left ankle and joints of left foot  Muscle weakness (generalized)  Pain in thoracic spine  Chronic bilateral low back pain with bilateral sciatica  Difficulty in walking, not elsewhere classified     Problem List Patient Active Problem List   Diagnosis Date Noted   Pain in thoracic spine 08/03/2020   Ulnar 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 of left upper extremity 08/09/2019   Pincer nail deformity 05/19/2019   Excessive daytime sleepiness 03/15/2019   Type 2 diabetes mellitus with hyperglycemia, with long-term current use of insulin ([place]) 03/08/2019   Type 2 diabetes mellitus with diabetic polyneuropathy, with long-term current use of insulin ([place]) 03/08/2019   Diabetic neuropathy with neurologic complication ([place]) [id number]   Claudication of both lower extremities ([place]) 02/04/2019   RLS (restless legs syndrome) 02/04/2019   Sleep deprivation 02/04/2019   Hot flashes 02/04/2019   Neck pain 02/04/2019   Chronic migraine without aura, with intractable migraine, so stated, with status migrainosus 01/27/2019   Acute pain of both knees 06/18/2018   Radiculopathy 02/11/2018   Bipolar disorder ([place]) 03/29/2016   Diabetic ketoacidosis ([place]) 03/24/2016   Dermatitis 03/24/2016   HTN (hypertension) 03/24/2016   HLD (hyperlipidemia) 03/24/2016   DM (diabetes mellitus) ([place]) 03/24/2016   Chronic daily headache  11/23/2012   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   Depression 11/23/2012    Gar Ponto MS, PT 08/08/20 1:35 PM   [facility] [address] [place], [place], [address] Phone: [phone]   Fax:  [phone]  Name: Leverta Schrader MRN: [id number] Date of Birth: 1961/01/04

## 2020-08-09 ENCOUNTER — Telehealth: Payer: Self-pay | Admitting: Nurse Practitioner

## 2020-08-09 DIAGNOSIS — G8929 Other chronic pain: Secondary | ICD-10-CM

## 2020-08-09 NOTE — Telephone Encounter (Signed)
Pt states the doctor she went to see for her knee pain referred her to another provider because the type of surgery she needs would have to be done by a different doctor. Pt states she would like to know if she could get something for pain until she gets in with the other doctor because she is still in a lot of pain.

## 2020-08-09 NOTE — Telephone Encounter (Signed)
Pt wanted to know if she can get something for pain for her left knee and ankle. She requested call back from nurse as well. Please advise

## 2020-08-10 ENCOUNTER — Other Ambulatory Visit: Payer: Self-pay

## 2020-08-10 ENCOUNTER — Telehealth: Payer: Self-pay | Admitting: Orthopaedic Surgery

## 2020-08-10 ENCOUNTER — Other Ambulatory Visit: Payer: Self-pay | Admitting: Physician Assistant

## 2020-08-10 ENCOUNTER — Ambulatory Visit: Payer: Medicare HMO

## 2020-08-10 DIAGNOSIS — M5441 Lumbago with sciatica, right side: Secondary | ICD-10-CM | POA: Diagnosis not present

## 2020-08-10 DIAGNOSIS — M25572 Pain in left ankle and joints of left foot: Secondary | ICD-10-CM

## 2020-08-10 DIAGNOSIS — R262 Difficulty in walking, not elsewhere classified: Secondary | ICD-10-CM | POA: Diagnosis not present

## 2020-08-10 DIAGNOSIS — M76822 Posterior tibial tendinitis, left leg: Secondary | ICD-10-CM

## 2020-08-10 DIAGNOSIS — M546 Pain in thoracic spine: Secondary | ICD-10-CM | POA: Diagnosis not present

## 2020-08-10 DIAGNOSIS — M6281 Muscle weakness (generalized): Secondary | ICD-10-CM

## 2020-08-10 DIAGNOSIS — G8929 Other chronic pain: Secondary | ICD-10-CM | POA: Diagnosis not present

## 2020-08-10 DIAGNOSIS — M5442 Lumbago with sciatica, left side: Secondary | ICD-10-CM | POA: Diagnosis not present

## 2020-08-10 MED ORDER — HYDROCODONE-ACETAMINOPHEN 5-325 MG PO TABS: 1.0000 | ORAL_TABLET | Freq: Four times a day (QID) | ORAL | 0 refills | Status: AC | PRN

## 2020-08-10 NOTE — Telephone Encounter (Signed)
Tramadol rx sent. Entered referral to pain clinic for chronic pain management.

## 2020-08-10 NOTE — Telephone Encounter (Signed)
Pt called requesting pain medication for her left knee pain. Pt states she made an appt with Dr. Paulo Fruit w/ referral but cant be seen until 2 nd wk of Aug . Asking for meds up until appt date. Please send to pharmacy on file pt phone number is [phone].

## 2020-08-10 NOTE — Telephone Encounter (Signed)
Looks like someone sent in meds today

## 2020-08-10 NOTE — Therapy (Signed)
[place], [place], [address] Phone: [phone]   Fax:  [phone]  Physical Therapy Treatment  Patient Details  Name: Autumn Valenzuela MRN: [id number] Date of Birth: February 28, 1960 Referring Provider (PT): [name], DPM``````````````````````````````````````````````````````````````````````````````````````````````````   Encounter Date: 08/10/2020   PT End of Session - 08/10/20 0829     Visit Number 11    Number of Visits 13    Date for PT Re-Evaluation 08/19/20    Authorization Type AET[employer] MEDICARE HMO/PPO    Progress Note Due on Visit 10    PT Start Time 0805    PT Stop Time 0845    PT Time Calculation (min) 40 min    Equipment Utilized During Treatment Other (comment)   knee and ankle brace   Activity Tolerance Patient tolerated treatment well    Behavior During Therapy [facility] for tasks assessed/performed             Past Medical History:  Diagnosis Date   Allergy    Anemia    Anxiety    Arthritis    Back pain    Bipolar disorder (HCC)    CVA (cerebral infarction)    Depression    Diabetes mellitus without complication (HCC)    High cholesterol    Hypertension    Left leg pain    Migraine    Sleep apnea    Stroke [facility])     Past Surgical History:  Procedure Laterality Date   ABDOMINAL HYSTERECTOMY     ANTERIOR LATERAL LUMBAR FUSION WITH PERCUTANEOUS SCREW 1 LEVEL Left 02/11/2018   Procedure: LEFT LATERAL LUMBAR  FOUR-FIVE INTERBODY FUSION WITH INSTRUMENTATION AND ALLOGRAFT;  Surgeon: Phylliss Bob, MD;  Location: [place];  Service: Orthopedics;  Laterality: Left;  LEFT LATERAL LUMBAR  FOUR-FIVE INTERBODY FUSION WITH INSTRUMENTATION AND ALLOGRAFT   BACK SURGERY     Lumbar fusion L5-S1   DIABETES     EYE SURGERY Bilateral    KNEE SURGERY     l 4-l5 status post lateral posterior fusion January 30,2020     NEUROFORAMINAL STENOSIS Left    Severe L4-L5, involving the level above  previous fusion.   PARTIAL HYSTERECTOMY     RADICULOPATHY Left    L4, Secondary to an L4-L5 Spondylolisthesis   SHOULDER SURGERY      There were no vitals filed for this visit.   Subjective Assessment - 08/10/20 0810     Subjective Pt reports she has been referred to another orthopedic MD for assessment of her R knee for revision of TKR c loosening haedware and concern for infection. Pt continues to report R lateral ankle and lower leg pain pain. Pt expressed being down with surgery for her L knee being possible    Pertinent History DM, CVA,arthritis, L knee TKR    Patient Stated Goals To be comfortable with shopping and caring for my grandkids    Currently in Pain? Yes    Pain Score 7     Pain Location [place]    Pain Orientation Left;Anterior;Lateral    Pain Descriptors / Indicators Aching    Pain Type Chronic pain    Pain Onset More than a month ago    Pain Frequency Constant                               [facility] Adult PT Treatment/Exercise - 08/10/20 0001       Manual  Therapy   Manual Therapy Soft tissue mobilization    Soft tissue mobilization Gentle STM to the L calf and light cross friction massage to the lateral ankle (peroneal tendons)      Ankle Exercises: Supine   T-Band 4 way RTB 10x3      Ankle Exercises: Seated   Towel Crunch --   20x   Towel Inversion/Eversion --   15x   Other Seated Ankle Exercises arch lifts, 20x                      PT Short Term Goals - 07/25/20 0841       PT SHORT TERM GOAL #1   Title Pt will be ind in an initial HEP    Baseline Started on eval    Time 3    Period Weeks    Status Achieved    Target Date 07/25/20      PT SHORT TERM GOAL #2   Title Pt will voice understanding of measures to assist in the management and reduction of L ankle/foot pain. 07/25/20: Pt reports she uses a variety of methods to mange her pain: RICE, heat, epson salt bath, tennis bal and roller massage, and a massage gun.     Time 3    Period Weeks    Status Achieved    Target Date 07/25/20               PT Long Term Goals - 08/08/20 1322       PT LONG TERM GOAL #2   Title Pt's FOTO score will improve to the predicted value of 40% functional ability.07/27/20: 36% functional ability. 08/08/20: 47% functional mobility    Baseline 36% functional ability    Status On-going    Target Date 08/19/20                   Plan - 08/10/20 1407     Clinical Impression Statement Pt has been refered to another orthopedist on 8/4 with possible revison of the L TKR. MD note indicates loosing of hardware and concern for infection. PT was continue to address L lateral calf and ankle pain per STM and cross friction massage. Additionally, strengthening of the L ankle and foot was completed to improve function and for prep with the possibility of L knee surgery. Pt has 2 more PT visits. Pt's situation re: L knee surgery should be known at that time.             Patient will benefit from skilled therapeutic intervention in order to improve the following deficits and impairments:     Visit Diagnosis: Posterior tibial tendinitis of left leg  Pain in left ankle and joints of left foot  Muscle weakness (generalized)  Difficulty in walking, not elsewhere classified     Problem List Patient Active Problem List   Diagnosis Date Noted   Pain in thoracic spine 08/03/2020   Ulnar 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 of left upper extremity 08/09/2019   Pincer nail deformity 05/19/2019   Excessive daytime sleepiness 03/15/2019   Type 2 diabetes mellitus with hyperglycemia, with long-term current use of insulin ([place]) 03/08/2019   Type 2 diabetes mellitus with diabetic polyneuropathy, with long-term current use of insulin ([place]) 03/08/2019   Diabetic neuropathy with neurologic complication ([place]) [id number]   Claudication of both lower extremities ([place]) 02/04/2019   RLS (restless legs syndrome) 02/04/2019   Sleep deprivation  02/04/2019   Hot flashes 02/04/2019   Neck  pain 02/04/2019   Chronic migraine without aura, with intractable migraine, so stated, with status migrainosus 01/27/2019   Acute pain of both knees 06/18/2018   Radiculopathy 02/11/2018   Bipolar disorder ([place]) 03/29/2016   Diabetic ketoacidosis ([place]) 03/24/2016   Dermatitis 03/24/2016   HTN (hypertension) 03/24/2016   HLD (hyperlipidemia) 03/24/2016   DM (diabetes mellitus) ([place]) 03/24/2016   Chronic daily headache 11/23/2012   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   Depression 11/23/2012    Gar Ponto 08/10/2020, 2:15 PM  [facility] [address] [place], [place], [address] Phone: [phone]   Fax:  [phone]  Name: Autumn Valenzuela MRN: [id number] Date of Birth: [birth year]

## 2020-08-11 NOTE — Telephone Encounter (Signed)
She got pain meds elsewhere.

## 2020-08-17 ENCOUNTER — Other Ambulatory Visit: Payer: Self-pay

## 2020-08-17 ENCOUNTER — Ambulatory Visit: Payer: Medicare HMO | Attending: Podiatry

## 2020-08-17 DIAGNOSIS — Z471 Aftercare following joint replacement surgery: Secondary | ICD-10-CM | POA: Diagnosis not present

## 2020-08-17 DIAGNOSIS — M25572 Pain in left ankle and joints of left foot: Secondary | ICD-10-CM | POA: Diagnosis not present

## 2020-08-17 DIAGNOSIS — M76822 Posterior tibial tendinitis, left leg: Secondary | ICD-10-CM

## 2020-08-17 DIAGNOSIS — M6281 Muscle weakness (generalized): Secondary | ICD-10-CM | POA: Diagnosis not present

## 2020-08-17 DIAGNOSIS — R262 Difficulty in walking, not elsewhere classified: Secondary | ICD-10-CM | POA: Diagnosis not present

## 2020-08-17 DIAGNOSIS — Z96652 Presence of left artificial knee joint: Secondary | ICD-10-CM | POA: Diagnosis not present

## 2020-08-17 NOTE — Telephone Encounter (Signed)
The second provider she has seen let her know -he can not do anything for her knee pain. She would like a call back from Naomi. She was very upset on the phone and started crying about everything going on. Please advise pt

## 2020-08-17 NOTE — Therapy (Addendum)
[place], [place], [address] Phone: [phone]   Fax:  [phone]  Physical Therapy Treatment/Discharge  Patient Details  Name: Autumn Valenzuela MRN: [id number] Date of Birth: 19-Jan-1960 Referring Provider (PT): [name], DPM``````````````````````````````````````````````````````````````````````````````````````````````````   Encounter Date: 08/17/2020   PT End of Session - 08/17/20 0802     Visit Number 12    Number of Visits 13    Date for PT Re-Evaluation 08/19/20    Authorization Type AETNA MEDICARE HMO/PPO    PT Start Time 0802    PT Stop Time 0835   pt needed to leave early for orthopedic appt   PT Time Calculation (min) 33 min    Equipment Utilized During Treatment Other (comment)    Activity Tolerance Patient tolerated treatment well;Patient limited by pain    Behavior During Therapy [facility] for tasks assessed/performed             Past Medical History:  Diagnosis Date   Allergy    Anemia    Anxiety    Arthritis    Back pain    Bipolar disorder ([place])    CVA (cerebral infarction)    Depression    Diabetes mellitus without complication (HCC)    High cholesterol    Hypertension    Left leg pain    Migraine    Sleep apnea    Stroke [facility])     Past Surgical History:  Procedure Laterality Date   ABDOMINAL HYSTERECTOMY     ANTERIOR LATERAL LUMBAR FUSION WITH PERCUTANEOUS SCREW 1 LEVEL Left 02/11/2018   Procedure: LEFT LATERAL LUMBAR  FOUR-FIVE INTERBODY FUSION WITH INSTRUMENTATION AND ALLOGRAFT;  Surgeon: Phylliss Bob, MD;  Location: [place];  Service: Orthopedics;  Laterality: Left;  LEFT LATERAL LUMBAR  FOUR-FIVE INTERBODY FUSION WITH INSTRUMENTATION AND ALLOGRAFT   BACK SURGERY     Lumbar fusion L5-S1   DIABETES     EYE SURGERY Bilateral    KNEE SURGERY     l 4-l5 status post lateral posterior fusion January 30,2020     NEUROFORAMINAL STENOSIS Left    Severe L4-L5,  involving the level above previous fusion.   PARTIAL HYSTERECTOMY     RADICULOPATHY Left    L4, Secondary to an L4-L5 Spondylolisthesis   SHOULDER SURGERY      There were no vitals filed for this visit.   Subjective Assessment - 08/17/20 0813     Subjective The L ankle and foot is not bothering too bad this AM, because she has not done too much activity as of yet    Pertinent History DM, CVA,arthritis, L knee TKR    Patient Stated Goals To be comfortable with shopping and caring for my grandkids    Currently in Pain? Yes    Pain Score 5     Pain Location Ankle    Pain Orientation Left;Anterior;Lateral    Pain Descriptors / Indicators Aching    Pain Type Chronic pain    Pain Onset More than a month ago    Pain Frequency Constant                               [facility] Adult PT Treatment/Exercise - 08/17/20 0001       Knee/Hip Exercises: Standing   Other Standing Knee Exercises Marching 20x2, airex, light 2 handed      Knee/Hip Exercises: Seated   Long Arc Quad Left;15 reps  Long Arc Quad Weight 2 lbs.    Sit to [employer] 10 reps      Knee/Hip Exercises: Supine   Straight Leg Raises Left;10 reps    Straight Leg Raises Limitations 2#      Knee/Hip Exercises: Sidelying   Hip ABduction Left;10 reps    Hip ABduction Limitations 2 lbs    Clams L, 15x      Ankle Exercises: Stretches   Soleus Stretch 2 reps;30 seconds    Gastroc Stretch 2 reps;30 seconds                      PT Short Term Goals - 07/25/20 0841       PT SHORT TERM GOAL #1   Title Pt will be ind in an initial HEP    Baseline Started on eval    Time 3    Period Weeks    Status Achieved    Target Date 07/25/20      PT SHORT TERM GOAL #2   Title Pt will voice understanding of measures to assist in the management and reduction of L ankle/foot pain. 07/25/20: Pt reports she uses a variety of methods to mange her pain: RICE, heat, epson salt bath, tennis bal and roller massage,  and a massage gun.    Time 3    Period Weeks    Status Achieved    Target Date 07/25/20               PT Long Term Goals - 08/08/20 1322       PT LONG TERM GOAL #2   Title Pt's FOTO score will improve to the predicted value of 40% functional ability.07/27/20: 36% functional ability. 08/08/20: 47% functional mobility    Baseline 36% functional ability    Status On-going    Target Date 08/19/20                   Plan - 08/17/20 0803     Clinical Impression Statement PT was completed for L ankle/LE ROM and strengthening/stability. pt tolerated wihtout adverse effects. Pt has an appt c an orthopedist today to determine course of care c TKR with loosing components. Pt has 1 more PT appt. and anticipate DC at that time. Will assess [place]/foot and review HEP to continue rehab at home.    Personal Factors and Comorbidities Comorbidity 1;Comorbidity 3+;Comorbidity 2;Fitness    Comorbidities DM, CVA, arthritis, Hx of L knee TKR    Examination-Activity Limitations Locomotion Level;Stand;Squat    Examination-Participation Restrictions Other    Stability/Clinical Decision Making Evolving/Moderate complexity    Clinical Decision Making Moderate    Rehab Potential Good    PT Frequency 2x / week    PT Duration 6 weeks    PT Treatment/Interventions ADLs/Self Care Home Management;Cryotherapy;Electrical Stimulation;Iontophoresis 51m/ml Dexamethasone;Moist Heat;Ultrasound;Therapeutic exercise;Gait training;Stair training;Functional mobility training;Patient/family education;Manual techniques;Joint Manipulations;Taping;Passive range of motion    PT Next Visit Plan Progress therex for L ankle as inidicated, hip strengthening, balance training. Assess balance/function.    PT Home Exercise Plan [email or web address]   Consulted and Agree with Plan of Care Patient             Patient will benefit from skilled therapeutic intervention in order to improve the following deficits and impairments:   Difficulty walking, Decreased activity tolerance, Pain, Decreased strength  Visit Diagnosis: Posterior tibial tendinitis of left leg  Pain in left ankle and joints of left foot  Muscle weakness (generalized)  Difficulty  in walking, not elsewhere classified     Problem List Patient Active Problem List   Diagnosis Date Noted   Pain in thoracic spine 08/03/2020   Ulnar 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 of left upper extremity 08/09/2019   Pincer nail deformity 05/19/2019   Excessive daytime sleepiness 03/15/2019   Type 2 diabetes mellitus with hyperglycemia, with long-term current use of insulin ([place]) 03/08/2019   Type 2 diabetes mellitus with diabetic polyneuropathy, with long-term current use of insulin ([place]) 03/08/2019   Diabetic neuropathy with neurologic complication ([place]) 44/92/0100   Claudication of both lower extremities ([place]) 02/04/2019   RLS (restless legs syndrome) 02/04/2019   Sleep deprivation 02/04/2019   Hot flashes 02/04/2019   Neck pain 02/04/2019   Chronic migraine without aura, with intractable migraine, so stated, with status migrainosus 01/27/2019   Acute pain of both knees 06/18/2018   Radiculopathy 02/11/2018   Bipolar disorder ([place]) 03/29/2016   Diabetic ketoacidosis ([place]) 03/24/2016   Dermatitis 03/24/2016   HTN (hypertension) 03/24/2016   HLD (hyperlipidemia) 03/24/2016   DM (diabetes mellitus) ([place]) 03/24/2016   Chronic daily headache 11/23/2012   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   Depression 11/23/2012    Gar Ponto MS, PT 08/17/20 8:48 AM   [facility] [address] [place], [place], [address] Phone: [phone]   Fax:  [phone]  Name: Autumn Valenzuela MRN: [id number] Date of Birth: [birth year]  PHYSICAL THERAPY DISCHARGE SUMMARY  Visits from Start of Care: 12  Current functional level related to goals / functional outcomes: See above   Remaining  deficits: See above   Education / Equipment: HEP  Patient agrees to discharge. Patient goals were partially met. Patient is being discharged due to not returning since the last visit.  Amandajo Gonder MS, PT 09/28/20 1:12 PM

## 2020-08-22 NOTE — Telephone Encounter (Signed)
Patient notified and verbalized understanding. 

## 2020-08-24 ENCOUNTER — Other Ambulatory Visit: Payer: Self-pay | Admitting: Nurse Practitioner

## 2020-08-24 ENCOUNTER — Other Ambulatory Visit: Payer: Self-pay | Admitting: Internal Medicine

## 2020-08-24 DIAGNOSIS — E1165 Type 2 diabetes mellitus with hyperglycemia: Secondary | ICD-10-CM

## 2020-08-24 DIAGNOSIS — Z794 Long term (current) use of insulin: Secondary | ICD-10-CM

## 2020-08-25 ENCOUNTER — Other Ambulatory Visit: Payer: Self-pay

## 2020-08-25 ENCOUNTER — Telehealth: Payer: Self-pay | Admitting: Nurse Practitioner

## 2020-08-25 ENCOUNTER — Telehealth: Payer: Self-pay

## 2020-08-25 MED ORDER — BASAGLAR KWIKPEN 100 UNIT/ML ~~LOC~~ SOPN: 100.0000 [IU] | PEN_INJECTOR | Freq: Every day | SUBCUTANEOUS | 3 refills | Status: DC

## 2020-08-25 NOTE — Telephone Encounter (Signed)
Pharmacy [facility] sent a message stating that the her insurance will no longer cover Lantus Solostar Pen 3ML 26 units daily  however they will cover Basaglar 100units is this okay to switch this , please advise

## 2020-08-25 NOTE — Telephone Encounter (Signed)
Sent to pharmacy 

## 2020-08-25 NOTE — Telephone Encounter (Signed)
What is the name of the medication? HYDROcodone-acetaminophen (NORCO/VICODIN) 5-325 MG tablet [id number]  Have you contacted your pharmacy to request a refill? Pt is wanting a refill on this script  Which pharmacy would you like this sent to? [facility] [phone] - [place], [place] [address] [place]  [address] [place] [address]  Phone:  [phone]  Fax:  [phone]  DEA #:  [id number]  Patient notified that their request is being sent to the clinical staff for review and that they should receive a call once it is complete. If they do not receive a call within 72 hours they can check with their pharmacy or our office.

## 2020-08-29 ENCOUNTER — Other Ambulatory Visit: Payer: Self-pay

## 2020-08-29 ENCOUNTER — Ambulatory Visit (INDEPENDENT_AMBULATORY_CARE_PROVIDER_SITE_OTHER): Payer: Medicare HMO | Admitting: Podiatry

## 2020-08-29 DIAGNOSIS — E1140 Type 2 diabetes mellitus with diabetic neuropathy, unspecified: Secondary | ICD-10-CM | POA: Diagnosis not present

## 2020-08-29 DIAGNOSIS — E1149 Type 2 diabetes mellitus with other diabetic neurological complication: Secondary | ICD-10-CM

## 2020-08-29 DIAGNOSIS — M5417 Radiculopathy, lumbosacral region: Secondary | ICD-10-CM

## 2020-08-30 NOTE — Progress Notes (Signed)
  Subjective:  Patient ID: Autumn Valenzuela, female    DOB: [birth year],  MRN: [id number]  Chief Complaint  Patient presents with   Tendonitis     4 week follow up,  after mri    60 y.o. female returns for follow-up with the above complaint. History confirmed with patient.  She completed the MRI feels about the same  Objective:  Physical Exam: warm, good capillary refill, no trophic changes or ulcerative lesions, normal DP and PT pulses and normal sensory exam. Left Foot: She has semi-reducible hammertoe contractures, pes planus,  Right Foot: Pes planus deformity, no pain on palpation today.  She is unable to do a single heel rise.   Radiographs: X-ray of both feet: no fracture, dislocation, swelling or degenerative changes noted and pes planus  Study Result  Narrative & Impression  CLINICAL DATA:  Left ankle pain for 3 months   EXAM: MRI OF THE LEFT ANKLE WITHOUT CONTRAST   TECHNIQUE: Multiplanar, multisequence MR imaging of the ankle was performed. No intravenous contrast was administered.   COMPARISON:  None.   FINDINGS: TENDONS   Peroneal: Peroneal longus tendon intact. Peroneal brevis intact.   Posteromedial: Posterior tibial tendon intact. Flexor hallucis longus tendon intact. Flexor digitorum longus tendon intact.   Anterior: Tibialis anterior tendon intact. Extensor hallucis longus tendon intact Extensor digitorum longus tendon intact.   Achilles:  Intact.   Plantar Fascia: Intact. Tiny plantar calcaneal spur.   LIGAMENTS   Lateral: Anterior talofibular ligament intact. Calcaneofibular ligament intact. Posterior talofibular ligament intact. Anterior and posterior tibiofibular ligaments intact.   Medial: Deltoid ligament intact. Spring ligament intact.   CARTILAGE   Ankle Joint: Small joint effusion. Normal ankle mortise. No chondral defect.   Subtalar Joints/Sinus Tarsi: Normal subtalar joints. No subtalar joint effusion. Normal sinus tarsi.    Bones: No marrow signal abnormality.  No fracture or dislocation.   Soft Tissue: No fluid collection or hematoma. Muscles are normal without edema or atrophy. Tarsal tunnel is normal.   IMPRESSION: 1. No internal derangement of the left ankle. 2.  No acute osseous injury of the left ankle.     Electronically Signed   By: Kathreen Devoid   On: 08/05/2020 07:44   Assessment:   1. Lumbosacral radiculopathy      Plan:  Patient was evaluated and treated and all questions answered.   Reviewed my MRI findings with the patient.  Discussed with her this is likely diabetic neuropathy and lumbosacral radiculopathy.  She will talk to her primary care doctor about treatment for her spine low back and radiculopathy.  Also discussed treatment for neuropathy and she will discuss this further with her PCP as well.  Return to see me as needed for other issues.  No indication for further procedures in the foot or ankle right now  No follow-ups on file.

## 2020-08-30 NOTE — Telephone Encounter (Signed)
Chart supports Rx Last seen 07/07/20 Next OV 09/05/20

## 2020-09-05 ENCOUNTER — Encounter: Payer: Self-pay | Admitting: Nurse Practitioner

## 2020-09-05 ENCOUNTER — Other Ambulatory Visit: Payer: Self-pay

## 2020-09-05 ENCOUNTER — Ambulatory Visit (INDEPENDENT_AMBULATORY_CARE_PROVIDER_SITE_OTHER): Payer: Medicare HMO | Admitting: Nurse Practitioner

## 2020-09-05 VITALS — BP 118/84 | HR 84 | Temp 97.2°F | Ht 61.0 in | Wt 207.3 lb

## 2020-09-05 DIAGNOSIS — M25562 Pain in left knee: Secondary | ICD-10-CM | POA: Diagnosis not present

## 2020-09-05 DIAGNOSIS — M25511 Pain in right shoulder: Secondary | ICD-10-CM | POA: Diagnosis not present

## 2020-09-05 DIAGNOSIS — M549 Dorsalgia, unspecified: Secondary | ICD-10-CM | POA: Diagnosis not present

## 2020-09-05 DIAGNOSIS — G8929 Other chronic pain: Secondary | ICD-10-CM

## 2020-09-05 DIAGNOSIS — M48061 Spinal stenosis, lumbar region without neurogenic claudication: Secondary | ICD-10-CM

## 2020-09-05 LAB — BASIC METABOLIC PANEL
BUN: 14 mg/dL (ref 6–23)
CO2: 29 mEq/L (ref 19–32)
Calcium: 9.7 mg/dL (ref 8.4–10.5)
Chloride: 105 mEq/L (ref 96–112)
Creatinine, Ser: 0.72 mg/dL (ref 0.40–1.20)
GFR: 92.44 mL/min (ref >60.00)
Glucose, Bld: 138 mg/dL — ABNORMAL HIGH (ref 70–99)
Potassium: 5.1 mEq/L (ref 3.5–5.1)
Sodium: 141 mEq/L (ref 135–145)

## 2020-09-05 LAB — CK: Total CK: 79 U/L (ref 7–177)

## 2020-09-05 MED ORDER — HYDROCODONE-ACETAMINOPHEN 5-325 MG PO TABS: 1.0000 | ORAL_TABLET | Freq: Two times a day (BID) | ORAL | 0 refills | Status: AC | PRN

## 2020-09-05 MED ORDER — MELOXICAM 15 MG PO TABS: 15.0000 mg | ORAL_TABLET | Freq: Every day | ORAL | 0 refills | Status: DC

## 2020-09-05 NOTE — Patient Instructions (Signed)
Go to lab for blood draw  Sign medical release to get records from [facility].  Use hydocodone for severe pain Continue gabapentin, cymbalta, and mobic daily. These all help to manage chronic pain.  You will be contacted to schedule appt with pain clinic.

## 2020-09-05 NOTE — Progress Notes (Signed)
Subjective:  Patient ID: Autumn Valenzuela, female    DOB: [birth year]  Age: 60 y.o. MRN: [id number]  CC: Acute Visit (Pt would like referral to a spine specialist due to back issues. Pt states she also has shoulder and knee pain.)  HPI Other chronic pain She wants referral to Autumn Valenzuela for a second opinion. She was not pleased with recommendation from Autumn Valenzuela. She has hx of chronic back pain secondary to spinal stenosis. She has spinal surgery last year by Autumn Valenzuela ortho provider but no improvement in back pain. She thinks this is related to leg and foot pain. 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 She was referred to ortho surgeon: Autumn Valenzuela, but she is not pleased with recommendation. Pain does not improve with tramadol, tylenol or meloxicam or gabapentin or cymbalta. Previous use of hydocodone with some relief. Last filled 08/10/2020, take 1tab EOD prn for severe pain. She is limited with ADLs (cleaning, grocery shopping and taking care of her grandchildren) due to pain. PMP database review: tramadol filled 12/24/19, 02/11/20, 03/17/20 #20 each time Hydrocodone 5/'325mg'$  filled 05/27/20 #15, 08/10/20 #12.  Advised to schedule appt with pain clinic when contacted Entered referral to Autumn Valenzuela as she requested Refilled hydrocodone #14, use for severe pain Continue gabapentin, meloxicam, and cymbalta at current dose. advised about the risk of drowsiness, dependence and constipation Additional hydrocodone refills from pain clinic   Reviewed past Medical, Social and Family history today.  Outpatient Medications Prior to Visit  Medication Sig Dispense Refill   Accu-Chek FastClix Lancets MISC TEST 4 TIMES DAILY. DX E11.9 306 each 3   acetaminophen (TYLENOL) 325 MG tablet Take 2 tablets (650 mg total) by mouth every 6 (six) hours as needed. 30 tablet 0   albuterol (PROVENTIL) (2.5 MG/3ML) 0.083% nebulizer solution Take 2.5 mg by  nebulization at bedtime as needed for wheezing.     albuterol (VENTOLIN HFA) 108 (90 Base) MCG/ACT inhaler Inhale 2 puffs into the lungs daily as needed for wheezing or shortness of breath. 18 g 2   Blood Glucose Calibration (ACCU-CHEK GUIDE CONTROL) LIQD 1 each by In Vitro route every 30 (thirty) days. Needs Accu-check guide monitor system. DX. E11.9 (Patient taking differently: 1 each by In Vitro route every 30 (thirty) days. Needs Accu-check guide monitor system. DX. E11.9 2 times daily) 3 each 3   calcium-vitamin D (OSCAL WITH D) 500-200 MG-UNIT per tablet Take 1 tablet by mouth daily with breakfast.     Cholecalciferol (VITAMIN D3) 1.25 MG (50000 UT) CAPS Take 1 capsule by mouth once a week. 4 capsule 0   Continuous Blood Gluc Receiver (FREESTYLE LIBRE 2 READER) DEVI 1 Device by Does not apply route as directed. 1 each 0   Continuous Blood Gluc Sensor (FREESTYLE LIBRE 2 SENSOR) MISC 1 Device by Does not apply route as directed. 2 each 11   diclofenac Sodium (VOLTAREN) 1 % GEL Apply 2 g topically 4 (four) times daily. 100 g 1   DULoxetine (CYMBALTA) 60 MG capsule Take 1 capsule (60 mg total) by mouth at bedtime. 90 capsule 3   empagliflozin (JARDIANCE) 10 MG TABS tablet Take 1 tablet (10 mg total) by mouth daily before breakfast. 30 tablet 6   ferrous sulfate 325 (65 FE) MG tablet Take 1 tablet (325 mg total) by mouth every other day. 45 tablet 3   fluticasone (FLONASE) 50 MCG/ACT nasal spray Place 2 sprays into both nostrils daily as needed for  allergies. 16 g 5   gabapentin (NEURONTIN) 300 MG capsule 1 capsule twice dailty 180 capsule 3   glucose blood (ACCU-CHEK GUIDE) test strip Use to test blood sugar 4 times a day.  Dx: E11.9 400 each 3   Insulin Glargine (BASAGLAR KWIKPEN) 100 UNIT/ML Inject 100 Units into the skin daily. 26 mL 3   Insulin Pen Needle 31G X 5 MM MISC 1 Units by Does not apply route at bedtime. 90 each 3   LANTUS SOLOSTAR 100 UNIT/ML Solostar Pen ADMINISTER 26 UNITS UNDER  THE SKIN DAILY 15 mL 6   metFORMIN (GLUCOPHAGE-XR) 500 MG 24 hr tablet TAKE 1 TABLET(500 MG) BY MOUTH TWICE DAILY WITH A MEAL 180 tablet 0   rosuvastatin (CRESTOR) 20 MG tablet Take 1 tablet (20 mg total) by mouth daily. 90 tablet 3   cyclobenzaprine (FLEXERIL) 10 MG tablet TAKE 1/2 TO 1 TABLET(5 TO 10 MG) BY MOUTH AT BEDTIME AS NEEDED FOR MUSCLE SPASMS 30 tablet 1   meloxicam (MOBIC) 15 MG tablet Take 1 tablet (15 mg total) by mouth daily. 30 tablet 0   No facility-administered medications prior to visit.    ROS See HPI  Objective:  BP 118/84 (BP Location: Left Arm, Patient Position: Sitting, Cuff Size: Normal)   Pulse 84   Temp (!) 97.2 F (36.2 C) (Temporal)   Ht '5\' 1"'$  (1.549 m)   Wt 207 lb 4.8 oz (94 kg)   SpO2 100%   BMI 39.17 kg/m   Physical Exam  Assessment & Plan:  This visit occurred during the SARS-CoV-2 public health emergency.  Safety protocols were in place, including screening questions prior to the visit, additional usage of staff PPE, and extensive cleaning of exam room while observing appropriate contact time as indicated for disinfecting solutions.   Autumn Valenzuela was seen today for acute visit.  Diagnoses and all orders for this visit:  Other chronic pain -     HYDROcodone-acetaminophen (NORCO/VICODIN) 5-325 MG tablet; Take 1 tablet by mouth every 12 (twelve) hours as needed for up to 7 days for severe pain. -     Ambulatory referral to Orthopedic Surgery -     meloxicam (MOBIC) 15 MG tablet; Take 1 tablet (15 mg total) by mouth daily. With meals -     Basic metabolic panel -     CK (Creatine Kinase)  Chronic pain of left knee -     HYDROcodone-acetaminophen (NORCO/VICODIN) 5-325 MG tablet; Take 1 tablet by mouth every 12 (twelve) hours as needed for up to 7 days for severe pain. -     Ambulatory referral to Orthopedic Surgery  Chronic right shoulder pain -     HYDROcodone-acetaminophen (NORCO/VICODIN) 5-325 MG tablet; Take 1 tablet by mouth every 12 (twelve)  hours as needed for up to 7 days for severe pain. -     Ambulatory referral to Orthopedic Surgery  Chronic bilateral back pain, unspecified back location -     HYDROcodone-acetaminophen (NORCO/VICODIN) 5-325 MG tablet; Take 1 tablet by mouth every 12 (twelve) hours as needed for up to 7 days for severe pain. -     Ambulatory referral to Orthopedic Surgery  Neuroforaminal stenosis of lumbar spine -     HYDROcodone-acetaminophen (NORCO/VICODIN) 5-325 MG tablet; Take 1 tablet by mouth every 12 (twelve) hours as needed for up to 7 days for severe pain. -     Ambulatory referral to Orthopedic Surgery   Problem List Items Addressed This Visit  Other   Other chronic pain - Primary    She wants referral to Autumn Valenzuela for a second opinion. She was not pleased with recommendation from [facility]. She has hx of chronic back pain secondary to spinal stenosis. She has spinal surgery last year by [facility] ortho provider but no improvement in back pain. She thinks this is related to leg and foot pain. 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 She was referred to ortho surgeon: Autumn Valenzuela, but she is not pleased with recommendation. Pain does not improve with tramadol, tylenol or meloxicam or gabapentin or cymbalta. Previous use of hydocodone with some relief. Last filled 08/10/2020, take 1tab EOD prn for severe pain. She is limited with ADLs (cleaning, grocery shopping and taking care of her grandchildren) due to pain. PMP database review: tramadol filled 12/24/19, 02/11/20, 03/17/20 #20 each time Hydrocodone 5/'325mg'$  filled 05/27/20 #15, 08/10/20 #12.  Advised to schedule appt with pain clinic when contacted Entered referral to Autumn Valenzuela as she requested Refilled hydrocodone #14, use for severe pain Continue gabapentin, meloxicam, and cymbalta at current dose. advised about the risk of drowsiness, dependence and constipation Additional hydrocodone  refills from pain clinic      Relevant Medications   HYDROcodone-acetaminophen (NORCO/VICODIN) 5-325 MG tablet   meloxicam (MOBIC) 15 MG tablet   Other Relevant Orders   Ambulatory referral to Orthopedic Surgery   Basic metabolic panel (Completed)   CK (Creatine Kinase) (Completed)   Other Visit Diagnoses     Chronic pain of left knee       Relevant Medications   HYDROcodone-acetaminophen (NORCO/VICODIN) 5-325 MG tablet   meloxicam (MOBIC) 15 MG tablet   Other Relevant Orders   Ambulatory referral to Orthopedic Surgery   Chronic right shoulder pain       Relevant Medications   HYDROcodone-acetaminophen (NORCO/VICODIN) 5-325 MG tablet   meloxicam (MOBIC) 15 MG tablet   Other Relevant Orders   Ambulatory referral to Orthopedic Surgery   Chronic bilateral back pain, unspecified back location       Relevant Medications   HYDROcodone-acetaminophen (NORCO/VICODIN) 5-325 MG tablet   meloxicam (MOBIC) 15 MG tablet   Other Relevant Orders   Ambulatory referral to Orthopedic Surgery   Neuroforaminal stenosis of lumbar spine       Relevant Medications   HYDROcodone-acetaminophen (NORCO/VICODIN) 5-325 MG tablet   Other Relevant Orders   Ambulatory referral to Orthopedic Surgery       Follow-up: Return in about 3 months (around 12/06/2020) for HTN and , hyperlipidemia (fasting, 43mns).  CWilfred Lacy NP

## 2020-09-06 ENCOUNTER — Encounter: Payer: Self-pay | Admitting: Nurse Practitioner

## 2020-09-06 DIAGNOSIS — G8929 Other chronic pain: Secondary | ICD-10-CM | POA: Insufficient documentation

## 2020-09-06 DIAGNOSIS — M545 Low back pain, unspecified: Secondary | ICD-10-CM | POA: Insufficient documentation

## 2020-09-06 NOTE — Assessment & Plan Note (Signed)
She wants referral to Autumn Valenzuela for a second opinion. She was not pleased with recommendation from [facility]. She has hx of chronic back pain secondary to spinal stenosis. She has spinal surgery last year by [facility] ortho provider but no improvement in back pain. She thinks this is related to leg and foot pain. 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 She was referred to ortho surgeon: Dr. Mayer Camel, but she is not pleased with recommendation. Pain does not improve with tramadol, tylenol or meloxicam or gabapentin or cymbalta. Previous use of hydocodone with some relief. Last filled 08/10/2020, take 1tab EOD prn for severe pain. She is limited with ADLs (cleaning, grocery shopping and taking care of her grandchildren) due to pain. PMP database review: tramadol filled 12/24/19, 02/11/20, 03/17/20 #20 each time Hydrocodone 5/'325mg'$  filled 05/27/20 #15, 08/10/20 #12.  Advised to schedule appt with pain clinic when contacted Entered referral to Autumn Valenzuela as she requested Refilled hydrocodone #14, use for severe pain Continue gabapentin, meloxicam, and cymbalta at current dose. advised about the risk of drowsiness, dependence and constipation Additional hydrocodone refills from pain clinic

## 2020-09-08 NOTE — Telephone Encounter (Signed)
Medication filled on 09/05/20

## 2020-09-14 DIAGNOSIS — M545 Low back pain, unspecified: Secondary | ICD-10-CM | POA: Diagnosis not present

## 2020-09-15 ENCOUNTER — Encounter: Payer: Self-pay | Admitting: Internal Medicine

## 2020-09-15 ENCOUNTER — Telehealth: Payer: Self-pay

## 2020-09-15 ENCOUNTER — Other Ambulatory Visit: Payer: Self-pay

## 2020-09-15 ENCOUNTER — Ambulatory Visit (INDEPENDENT_AMBULATORY_CARE_PROVIDER_SITE_OTHER): Payer: Medicare HMO | Admitting: Internal Medicine

## 2020-09-15 VITALS — BP 122/80 | HR 80 | Ht 61.0 in | Wt 208.0 lb

## 2020-09-15 DIAGNOSIS — E1165 Type 2 diabetes mellitus with hyperglycemia: Secondary | ICD-10-CM | POA: Diagnosis not present

## 2020-09-15 DIAGNOSIS — E1159 Type 2 diabetes mellitus with other circulatory complications: Secondary | ICD-10-CM

## 2020-09-15 DIAGNOSIS — E1142 Type 2 diabetes mellitus with diabetic polyneuropathy: Secondary | ICD-10-CM | POA: Diagnosis not present

## 2020-09-15 DIAGNOSIS — Z794 Long term (current) use of insulin: Secondary | ICD-10-CM | POA: Diagnosis not present

## 2020-09-15 LAB — POCT GLYCOSYLATED HEMOGLOBIN (HGB A1C): Hemoglobin A1C: 7.5 % — AB (ref 4.0–5.6)

## 2020-09-15 MED ORDER — DEXCOM G6 SENSOR MISC: 1.0000 | 3 refills | Status: DC

## 2020-09-15 MED ORDER — INSULIN PEN NEEDLE 31G X 5 MM MISC: 1.0000 [IU] | Freq: Four times a day (QID) | 1 refills | Status: DC

## 2020-09-15 MED ORDER — NOVOLOG FLEXPEN 100 UNIT/ML ~~LOC~~ SOPN: PEN_INJECTOR | SUBCUTANEOUS | 6 refills | Status: DC

## 2020-09-15 MED ORDER — DEXCOM G6 TRANSMITTER MISC: 1.0000 | 3 refills | Status: DC

## 2020-09-15 MED ORDER — LANTUS SOLOSTAR 100 UNIT/ML ~~LOC~~ SOPN: 26.0000 [IU] | PEN_INJECTOR | Freq: Every day | SUBCUTANEOUS | 3 refills | Status: DC

## 2020-09-15 NOTE — Patient Instructions (Signed)
-   Keep Up the Good Work ! - Continue Metformin 500 mg 1 tablet Twice a day  - Continue  lantus 26 units daily  - Increase Jardiance to 25 mg , 1 tablet daily   - While on Prednisone check your sugar before each meal and use Novolog as below   Novolog correctional insulin: Use the scale below to help guide you:   Blood sugar before meal Number of units to inject  Less than 150 0 unit  151 -  170 1 units  171 -  190 2 units  191 -  210 3 units  211 -  230 4 units  231 -  250 5 units  251 -  270 6 units  271 -  290 7 units  291 -  310 8 units  311 - 330 9 units   331 -  350 10 units         HOW TO TREAT LOW BLOOD SUGARS (Blood sugar LESS THAN 70 MG/DL) Please follow the RULE OF 15 for the treatment of hypoglycemia treatment (when your (blood sugars are less than 70 mg/dL)   STEP 1: Take 15 grams of carbohydrates when your blood sugar is low, which includes:  3-4 GLUCOSE TABS  OR 3-4 OZ OF JUICE OR REGULAR SODA OR ONE TUBE OF GLUCOSE GEL    STEP 2: RECHECK blood sugar in 15 MINUTES STEP 3: If your blood sugar is still low at the 15 minute recheck --> then, go back to STEP 1 and treat AGAIN with another 15 grams of carbohydrates.

## 2020-09-15 NOTE — Progress Notes (Signed)
Name: Autumn Valenzuela  Age/ Sex: 60 y.o., female   MRN/ DOB: [id number], [birth year]     PCP: Flossie Buffy, NP   Reason for Endocrinology Evaluation: Type 2 Diabetes Mellitus  Initial Endocrine Consultative Visit: 03/08/2019    PATIENT IDENTIFIER: Autumn Valenzuela is a 60 y.o. female with a past medical history of .T2Dm, bipolar disorder,migrane headaches  and dyslipidemia  The patient has followed with Endocrinology clinic since 03/08/2019 for consultative assistance with management of her diabetes.  DIABETIC HISTORY:  Autumn Valenzuela was diagnosed with T2DM in 2018.  She has been tried on oral glycemic agents such as glipizide and Metformin, she is intolerant to higher doses of Metformin.  She has been on insulin since 2018.  Her hemoglobin A1c has ranged from  6.7% in 2018, peaking at 14.6% in 2021   On her initial visit to our clinic she had an A1c of 14.6%, she was on metformin and Lantus.  SUBJECTIVE:   During the last visit (07/10/2020): A1c of 8.6 %.  We continued metformin and Lantus and started Jardiance     Today (09/15/2020): Autumn Valenzuela is here for follow-up on diabetes management. .She has been checking glucose occasionally but she did not bring her meter today.  She has pending left carpal tunnel surgery but this had to be postponed due to an A1c of 8.6%    Saw her PCP for back pain and has requested a second opinion from Ortho   She has sciatica and started on Prednisone for 12 days    HOME DIABETES REGIMEN:  Metformin 500 mg BID  Lantus 26 units daily Jardiance 10 mg daily      Statin: Yes ACE-I/ARB: No   METER DOWNLOAD SUMMARY: 8/19-09/15/2020  Average Number Tests/Day = 1.1 Overall Mean FS Glucose = 106 Standard Deviation = 22  BG Ranges: Low = 73 High = 141   Hypoglycemic Events/30 Days: BG < 50 = 0 Episodes of symptomatic severe hypoglycemia = 0     DIABETIC COMPLICATIONS: Microvascular complications:  Neuropathy Denies:  CKD, retinopathy  Last eye exam: Completed 05/2020   Macrovascular complications:  Denies: CAD, PVD, CVA     HISTORY:  Past Medical History:  Past Medical History:  Diagnosis Date   Allergy    Anemia    Anxiety    Arthritis    Back pain    Bipolar disorder ([place])    CVA (cerebral infarction)    Depression    Diabetes mellitus without complication (HCC)    High cholesterol    Hypertension    Left leg pain    Migraine    Sleep apnea    Stroke [facility])    Past Surgical History:  Past Surgical History:  Procedure Laterality Date   ABDOMINAL HYSTERECTOMY     ANTERIOR LATERAL LUMBAR FUSION WITH PERCUTANEOUS SCREW 1 LEVEL Left 02/11/2018   Procedure: LEFT LATERAL LUMBAR  FOUR-FIVE INTERBODY FUSION WITH INSTRUMENTATION AND ALLOGRAFT;  Surgeon: Phylliss Bob, MD;  Location: [facility];  Service: Orthopedics;  Laterality: Left;  LEFT LATERAL LUMBAR  FOUR-FIVE INTERBODY FUSION WITH INSTRUMENTATION AND ALLOGRAFT   BACK SURGERY     Lumbar fusion L5-S1   DIABETES     EYE SURGERY Bilateral    KNEE SURGERY     l 4-l5 status post lateral posterior fusion January 30,2020     NEUROFORAMINAL STENOSIS Left    Severe L4-L5, involving the level above previous fusion.   PARTIAL HYSTERECTOMY  RADICULOPATHY Left    L4, Secondary to an L4-L5 Spondylolisthesis   SHOULDER SURGERY     Social History:  reports that she quit smoking about 4 years ago. Her smoking use included cigarettes. She has never used smokeless tobacco. She reports current alcohol use. She reports current drug use. Drug: Marijuana. Family History:  Family History  Problem Relation Age of Onset   Diabetes Mother    Stroke Father    Cancer Paternal Aunt    Cancer Paternal Aunt    Diabetes Brother    Colon cancer Neg Hx    Esophageal cancer Neg Hx    Rectal cancer Neg Hx    Stomach cancer Neg Hx      HOME MEDICATIONS: Allergies as of 09/15/2020       Reactions   Cefazolin Swelling, Other (See Comments)   Rapid iv  infusion caused swelling and burning at the iv site        Medication List        Accurate as of September 15, 2020 11:11 AM. If you have any questions, ask your nurse or doctor.          Accu-Chek FastClix Lancets Misc TEST 4 TIMES DAILY. DX E11.9   Accu-Chek Guide Control Liqd 1 each by In Vitro route every 30 (thirty) days. Needs Accu-check guide monitor system. DX. E11.9 What changed: additional instructions   Accu-Chek Guide test strip Generic drug: glucose blood Use to test blood sugar 4 times a day.  Dx: E11.9   acetaminophen 325 MG tablet Commonly known as: Tylenol Take 2 tablets (650 mg total) by mouth every 6 (six) hours as needed.   albuterol 108 (90 Base) MCG/ACT inhaler Commonly known as: VENTOLIN HFA Inhale 2 puffs into the lungs daily as needed for wheezing or shortness of breath.   albuterol (2.5 MG/3ML) 0.083% nebulizer solution Commonly known as: PROVENTIL Take 2.5 mg by nebulization at bedtime as needed for wheezing.   calcium-vitamin D 500-200 MG-UNIT tablet Commonly known as: OSCAL WITH D Take 1 tablet by mouth daily with breakfast.   diclofenac Sodium 1 % Gel Commonly known as: Voltaren Apply 2 g topically 4 (four) times daily.   DULoxetine 60 MG capsule Commonly known as: CYMBALTA Take 1 capsule (60 mg total) by mouth at bedtime.   empagliflozin 10 MG Tabs tablet Commonly known as: Jardiance Take 1 tablet (10 mg total) by mouth daily before breakfast.   ferrous sulfate 325 (65 FE) MG tablet Take 1 tablet (325 mg total) by mouth every other day.   fluticasone 50 MCG/ACT nasal spray Commonly known as: FLONASE Place 2 sprays into both nostrils daily as needed for allergies.   FreeStyle Libre 2 Reader Devi 1 Device by Does not apply route as directed.   FreeStyle Libre 2 Sensor Misc 1 Device by Does not apply route as directed.   gabapentin 300 MG capsule Commonly known as: NEURONTIN 1 capsule twice dailty   Insulin Pen Needle  31G X 5 MM Misc 1 Units by Does not apply route at bedtime.   Lantus SoloStar 100 UNIT/ML Solostar Pen Generic drug: insulin glargine ADMINISTER 26 UNITS UNDER THE SKIN DAILY   Basaglar KwikPen 100 UNIT/ML Inject 100 Units into the skin daily.   meloxicam 15 MG tablet Commonly known as: Mobic Take 1 tablet (15 mg total) by mouth daily. With meals   metFORMIN 500 MG 24 hr tablet Commonly known as: GLUCOPHAGE-XR TAKE 1 TABLET(500 MG) BY MOUTH TWICE DAILY WITH A  MEAL   rosuvastatin 20 MG tablet Commonly known as: Crestor Take 1 tablet (20 mg total) by mouth daily.   Vitamin D3 1.25 MG (50000 UT) Caps Take 1 capsule by mouth once a week.         OBJECTIVE:   Vital Signs: BP 122/80 (BP Location: Left Arm, Patient Position: Sitting, Cuff Size: Normal)   Pulse 80   Ht '5\' 1"'$  (1.549 m)   Wt 208 lb (94.3 kg)   SpO2 99%   BMI 39.30 kg/m   Wt Readings from Last 3 Encounters:  09/15/20 208 lb (94.3 kg)  09/05/20 207 lb 4.8 oz (94 kg)  08/08/20 205 lb (93 kg)     Exam: General: Pt appears well and is in NAD  Lungs: Clear with good BS bilat with no rales, rhonchi, or wheezes  Heart: RRR with normal S1 and S2 and no gallops; no murmurs; no rub  Extremities: No pretibial edema.   Neuro: MS is good with appropriate affect, pt is alert and Ox3      DATA REVIEWED:  Lab Results  Component Value Date   HGBA1C 8.6 (H) 06/21/2020   HGBA1C 8.1 (A) 08/16/2019   HGBA1C 14.4 (A) 05/03/2019   Lab Results  Component Value Date   MICROALBUR <0.7 09/22/2019   LDLCALC 134 (H) 07/07/2020   CREATININE 0.72 09/05/2020   Lab Results  Component Value Date   MICRALBCREAT 0.5 09/22/2019     Lab Results  Component Value Date   CHOL 214 (H) 07/07/2020   HDL 47.70 07/07/2020   LDLCALC 134 (H) 07/07/2020   TRIG 161.0 (H) 07/07/2020   CHOLHDL 4 07/07/2020       Results for Autumn Valenzuela, Autumn Valenzuela (MRN [id number]) as of 09/15/2020 11:20  Ref. Range 09/05/2020 09:39  Sodium Latest Ref  Range: 135 - 145 mEq/L 141  Potassium Latest Ref Range: 3.5 - 5.1 mEq/L 5.1  Chloride Latest Ref Range: 96 - 112 mEq/L 105  CO2 Latest Ref Range: 19 - 32 mEq/L 29  Glucose Latest Ref Range: 70 - 99 mg/dL 138 (H)  BUN Latest Ref Range: 6 - 23 mg/dL 14  Creatinine Latest Ref Range: 0.40 - 1.20 mg/dL 0.72  Calcium Latest Ref Range: 8.4 - 10.5 mg/dL 9.7  GFR Latest Ref Range: >60.00 mL/min 92.44   ASSESSMENT / PLAN / RECOMMENDATIONS:   1) Type 2 Diabetes Mellitus, with improving glycemic control , With neuropathic complications - Most recent A1c of 7.5  %. Goal A1c <7.0%.      - Pending left carpal tunnel arm sx , A1c goal for the surgery is <8.0% - She is cleared at this time for carpal tunnel sx  - Will increase Jardiance as below as she is tolerating it well - Due to starting prednisone for sciatica, I am going to start her on Novolog per correction scale as below. She was provided with a novolog sample   MEDICATIONS: Continue Metformin 500 mg 1 tablet with breakfast Continue  lantus  26 units daily  Increase Jardiance 25 mg daily Correction scale : Novolog (BG- 130/20)   EDUCATION / INSTRUCTIONS: BG monitoring instructions: Patient is instructed to check her blood sugars 2 times a day, fasting and bedtime as much as possible Call [facility] if: BG persistently  <70 I reviewed the Rule of 15 for the treatment of hypoglycemia in detail with the patient. Literature supplied.    2) Diabetic complications:  Eye: Does not have known diabetic retinopathy.  Neuro/  Feet: Does have known diabetic peripheral neuropathy. Renal: Patient does not have known baseline CKD. She is not an ACEI/ARB at present      F/U in 4 months    Signed electronically by: Mack Guise, MD  [facility]  [address] [place], [place] [address] Phone: [phone] FAX: [phone]   CC: Flossie Buffy, [place] [address] Phone: [phone]  Fax: [phone]  Return to Endocrinology clinic as below: Future Appointments  Date Time Provider [place]  10/05/2020 10:45 AM [place] [facility]  12/06/2020  8:30 AM [place], Charlene Brooke, NP [facility]

## 2020-09-15 NOTE — Telephone Encounter (Signed)
Patient given 1 pen of Novolog Flex pen 100units/ml (u-100 at today visit.  [facility]- [phone]

## 2020-09-20 ENCOUNTER — Telehealth: Payer: Self-pay | Admitting: Nurse Practitioner

## 2020-09-20 NOTE — Telephone Encounter (Signed)
What is the name of the medication? rosuvastatin (CRESTOR) 20 MG tablet [id number]   Have you contacted your pharmacy to request a refill? [facility] is requesting a 100 tablets script for insurance to pay.   Which pharmacy would you like this sent to? Pharmacy  [facility] [id number] - [place], [place] - [address]  [address] [place] [address]  Phone:  [phone]  Fax:  [phone]  DEA #:  [id number]     Patient notified that their request is being sent to the clinical staff for review and that they should receive a call once it is complete. If they do not receive a call within 72 hours they can check with their pharmacy or our office.

## 2020-09-22 ENCOUNTER — Telehealth: Payer: Self-pay | Admitting: Nurse Practitioner

## 2020-09-22 DIAGNOSIS — M5440 Lumbago with sciatica, unspecified side: Secondary | ICD-10-CM | POA: Diagnosis not present

## 2020-09-22 DIAGNOSIS — G8929 Other chronic pain: Secondary | ICD-10-CM

## 2020-09-22 MED ORDER — HYDROCODONE-ACETAMINOPHEN 5-325 MG PO TABS: 1.0000 | ORAL_TABLET | Freq: Every day | ORAL | 0 refills | Status: AC | PRN

## 2020-09-22 NOTE — Telephone Encounter (Signed)
Pt requesting refill on Hydrocodone

## 2020-09-22 NOTE — Telephone Encounter (Signed)
Spoke with pharmacy and they states the current Rx is fine and will contact patient to let her know she can get her refill.

## 2020-09-22 NOTE — Telephone Encounter (Signed)
Patient states she has not heard from a Pain Clinic and has not seen one. I have contacted the referrals coordinator to look into this.   PMP Database last 3 entries  09/05/20 Qty 14 08/10/20  Qty 12 05/27/20  Qty 15  Please advise

## 2020-09-25 ENCOUNTER — Other Ambulatory Visit: Payer: Self-pay | Admitting: Neurosurgery

## 2020-09-25 DIAGNOSIS — M5440 Lumbago with sciatica, unspecified side: Secondary | ICD-10-CM

## 2020-10-03 ENCOUNTER — Inpatient Hospital Stay
Admission: RE | Admit: 2020-10-03 | Discharge: 2020-10-03 | Disposition: A | Discharge status: Home / Self Care | Payer: Medicare HMO | Source: Ambulatory Visit | Attending: Neurosurgery | Admitting: Neurosurgery

## 2020-10-03 ENCOUNTER — Other Ambulatory Visit: Payer: Medicaid Other

## 2020-10-03 MED ORDER — MEPERIDINE HCL 50 MG/ML IJ SOLN
50.0000 mg | Freq: Once | INTRAMUSCULAR | Status: DC | PRN
Start: 2020-10-03 — End: 2020-10-10

## 2020-10-03 MED ORDER — DIAZEPAM 5 MG PO TABS: 10.0000 mg | ORAL_TABLET | Freq: Once | ORAL | Status: DC

## 2020-10-03 MED ORDER — ONDANSETRON HCL 4 MG/2ML IJ SOLN: 4.0000 mg | Freq: Once | INTRAMUSCULAR | Status: DC | PRN

## 2020-10-03 NOTE — Discharge Instructions (Signed)

## 2020-10-05 ENCOUNTER — Ambulatory Visit: Payer: Medicaid Other

## 2020-10-11 ENCOUNTER — Other Ambulatory Visit: Payer: Self-pay

## 2020-10-11 ENCOUNTER — Ambulatory Visit
Admission: RE | Admit: 2020-10-11 | Discharge: 2020-10-11 | Disposition: A | Discharge status: Home / Self Care | Payer: Medicaid Other | Source: Ambulatory Visit | Attending: Neurosurgery | Admitting: Neurosurgery

## 2020-10-11 ENCOUNTER — Ambulatory Visit
Admission: RE | Admit: 2020-10-11 | Discharge: 2020-10-11 | Disposition: A | Discharge status: Home / Self Care | Payer: Medicare HMO | Source: Ambulatory Visit | Attending: Neurosurgery | Admitting: Neurosurgery

## 2020-10-11 DIAGNOSIS — M5440 Lumbago with sciatica, unspecified side: Secondary | ICD-10-CM

## 2020-10-11 DIAGNOSIS — M4326 Fusion of spine, lumbar region: Secondary | ICD-10-CM | POA: Diagnosis not present

## 2020-10-11 DIAGNOSIS — M5126 Other intervertebral disc displacement, lumbar region: Secondary | ICD-10-CM | POA: Diagnosis not present

## 2020-10-11 DIAGNOSIS — M5416 Radiculopathy, lumbar region: Secondary | ICD-10-CM | POA: Diagnosis not present

## 2020-10-11 MED ORDER — MEPERIDINE HCL 50 MG/ML IJ SOLN: 50.0000 mg | Freq: Once | INTRAMUSCULAR | Status: DC | PRN

## 2020-10-11 MED ORDER — IOPAMIDOL (ISOVUE-M 200) INJECTION 41%
20.0000 mL | Freq: Once | INTRAMUSCULAR | Status: AC
  Administered 2020-10-11: 20 mL via INTRATHECAL

## 2020-10-11 MED ORDER — DIAZEPAM 5 MG PO TABS
10.0000 mg | ORAL_TABLET | Freq: Once | ORAL | Status: AC
  Administered 2020-10-11: 10 mg via ORAL

## 2020-10-11 MED ORDER — ONDANSETRON HCL 4 MG/2ML IJ SOLN: 4.0000 mg | Freq: Once | INTRAMUSCULAR | Status: DC | PRN

## 2020-10-11 NOTE — Discharge Instructions (Signed)

## 2020-10-24 ENCOUNTER — Ambulatory Visit (INDEPENDENT_AMBULATORY_CARE_PROVIDER_SITE_OTHER): Payer: Medicare HMO | Admitting: *Deleted

## 2020-10-24 DIAGNOSIS — Z0000 Encounter for general adult medical examination without abnormal findings: Secondary | ICD-10-CM

## 2020-10-24 NOTE — Patient Instructions (Signed)
Ms. Cotrell , Thank you for taking time to come for your Medicare Wellness Visit. I appreciate your ongoing commitment to your health goals. Please review the following plan we discussed and let me know if I can assist you in the future.   Screening recommendations/referrals: Colonoscopy: up to date Mammogram: up to date Bone Density: not indicated Recommended yearly ophthalmology/optometry visit for glaucoma screening and checkup Recommended yearly dental visit for hygiene and checkup  Vaccinations: Influenza vaccine: Education provided Pneumococcal vaccine: Education provided Tdap vaccine: Education provided Shingles vaccine: Education provided    Advanced directives:   Education provided  Conditions/risks identified:   Next appointment: 12-06-2020 @ 10:00 Dr. Lorayne Marek   Preventive Care 58 Years and Older, Female Preventive care refers to lifestyle choices and visits with your health care provider that can promote health and wellness. What does preventive care include? A yearly physical exam. This is also called an annual well check. Dental exams once or twice a year. Routine eye exams. Ask your health care provider how often you should have your eyes checked. Personal lifestyle choices, including: Daily care of your teeth and gums. Regular physical activity. Eating a healthy diet. Avoiding tobacco and drug use. Limiting alcohol use. Practicing safe sex. Taking low-dose aspirin every day. Taking vitamin and mineral supplements as recommended by your health care provider. What happens during an annual well check? The services and screenings done by your health care provider during your annual well check will depend on your age, overall health, lifestyle risk factors, and family history of disease. Counseling  Your health care provider may ask you questions about your: Alcohol use. Tobacco use. Drug use. Emotional well-being. Home and relationship well-being. Sexual  activity. Eating habits. History of falls. Memory and ability to understand (cognition). Work and work Statistician. Reproductive health. Screening  You may have the following tests or measurements: Height, weight, and BMI. Blood pressure. Lipid and cholesterol levels. These may be checked every 5 years, or more frequently if you are over 81 years old. Skin check. Lung cancer screening. You may have this screening every year starting at age 58 if you have a 30-pack-year history of smoking and currently smoke or have quit within the past 15 years. Fecal occult blood test (FOBT) of the stool. You may have this test every year starting at age 58. Flexible sigmoidoscopy or colonoscopy. You may have a sigmoidoscopy every 5 years or a colonoscopy every 10 years starting at age 58. Hepatitis C blood test. Hepatitis B blood test. Sexually transmitted disease (STD) testing. Diabetes screening. This is done by checking your blood sugar (glucose) after you have not eaten for a while (fasting). You may have this done every 1-3 years. Bone density scan. This is done to screen for osteoporosis. You may have this done starting at age 58. Mammogram. This may be done every 1-2 years. Talk to your health care provider about how often you should have regular mammograms. Talk with your health care provider about your test results, treatment options, and if necessary, the need for more tests. Vaccines  Your health care provider may recommend certain vaccines, such as: Influenza vaccine. This is recommended every year. Tetanus, diphtheria, and acellular pertussis (Tdap, Td) vaccine. You may need a Td booster every 10 years. Zoster vaccine. You may need this after age 58. Pneumococcal 13-valent conjugate (PCV13) vaccine. One dose is recommended after age 26. Pneumococcal polysaccharide (PPSV23) vaccine. One dose is recommended after age 24. Talk to your health care provider about  which screenings and vaccines  you need and how often you need them. This information is not intended to replace advice given to you by your health care provider. Make sure you discuss any questions you have with your health care provider. Document Released: 01/27/2015 Document Revised: 09/20/2015 Document Reviewed: 11/01/2014 Elsevier Interactive Patient Education  2017 [employer] Prevention in the Home Falls can cause injuries. They can happen to people of all ages. There are many things you can do to make your home safe and to help prevent falls. What can I do on the outside of my home? Regularly fix the edges of walkways and driveways and fix any cracks. Remove anything that might make you trip as you walk through a door, such as a raised step or threshold. Trim any bushes or trees on the path to your home. Use bright outdoor lighting. Clear any walking paths of anything that might make someone trip, such as rocks or tools. Regularly check to see if handrails are loose or broken. Make sure that both sides of any steps have handrails. Any raised decks and porches should have guardrails on the edges. Have any leaves, snow, or ice cleared regularly. Use sand or salt on walking paths during winter. Clean up any spills in your garage right away. This includes oil or grease spills. What can I do in the bathroom? Use night lights. Install grab bars by the toilet and in the tub and shower. Do not use towel bars as grab bars. Use non-skid mats or decals in the tub or shower. If you need to sit down in the shower, use a plastic, non-slip stool. Keep the floor dry. Clean up any water that spills on the floor as soon as it happens. Remove soap buildup in the tub or shower regularly. Attach bath mats securely with double-sided non-slip rug tape. Do not have throw rugs and other things on the floor that can make you trip. What can I do in the bedroom? Use night lights. Make sure that you have a light by your bed that  is easy to reach. Do not use any sheets or blankets that are too big for your bed. They should not hang down onto the floor. Have a firm chair that has side arms. You can use this for support while you get dressed. Do not have throw rugs and other things on the floor that can make you trip. What can I do in the kitchen? Clean up any spills right away. Avoid walking on wet floors. Keep items that you use a lot in easy-to-reach places. If you need to reach something above you, use a strong step stool that has a grab bar. Keep electrical cords out of the way. Do not use floor polish or wax that makes floors slippery. If you must use wax, use non-skid floor wax. Do not have throw rugs and other things on the floor that can make you trip. What can I do with my stairs? Do not leave any items on the stairs. Make sure that there are handrails on both sides of the stairs and use them. Fix handrails that are broken or loose. Make sure that handrails are as long as the stairways. Check any carpeting to make sure that it is firmly attached to the stairs. Fix any carpet that is loose or worn. Avoid having throw rugs at the top or bottom of the stairs. If you do have throw rugs, attach them to the floor with  carpet tape. Make sure that you have a light switch at the top of the stairs and the bottom of the stairs. If you do not have them, ask someone to add them for you. What else can I do to help prevent falls? Wear shoes that: Do not have high heels. Have rubber bottoms. Are comfortable and fit you well. Are closed at the toe. Do not wear sandals. If you use a stepladder: Make sure that it is fully opened. Do not climb a closed stepladder. Make sure that both sides of the stepladder are locked into place. Ask someone to hold it for you, if possible. Clearly mark and make sure that you can see: Any grab bars or handrails. First and last steps. Where the edge of each step is. Use tools that help you  move around (mobility aids) if they are needed. These include: Canes. Walkers. Scooters. Crutches. Turn on the lights when you go into a dark area. Replace any light bulbs as soon as they burn out. Set up your furniture so you have a clear path. Avoid moving your furniture around. If any of your floors are uneven, fix them. If there are any pets around you, be aware of where they are. Review your medicines with your doctor. Some medicines can make you feel dizzy. This can increase your chance of falling. Ask your doctor what other things that you can do to help prevent falls. This information is not intended to replace advice given to you by your health care provider. Make sure you discuss any questions you have with your health care provider. Document Released: 10/27/2008 Document Revised: 06/08/2015 Document Reviewed: 02/04/2014 Elsevier Interactive Patient Education  2017 [employer].

## 2020-10-24 NOTE — Progress Notes (Signed)
Subjective:   Autumn Valenzuela is a 60 y.o. female who presents for Medicare Annual (Subsequent) preventive examination.  I connected with  [name] on 10/24/20 by a telephone enabled telemedicine application and verified that I am speaking with the correct person using two identifiers.   I discussed the limitations of evaluation and management by telemedicine. The patient expressed understanding and agreed to proceed.   Review of Systems     Cardiac Risk Factors include: advanced age (>38men, >41 women);diabetes mellitus;hypertension;obesity (BMI >30kg/m2);sedentary lifestyle     Objective:    Today's Vitals   10/24/20 0828  PainSc: 4    There is no height or weight on file to calculate BMI.  Advanced Directives 10/24/2020 06/27/2020 06/14/2019 08/25/2018 02/13/2018 02/09/2018 10/14/2017  Does Patient Have a Medical Advance Directive? No No;Yes No No No No No  Type of Advance Directive - [place]  Does patient want to make changes to medical advance directive? - No - Patient declined - - - - -  Copy of [place] in Chart? - No - copy requested - - - - -  Would patient like information on creating a medical advance directive? No - Patient declined No - Patient declined No - Patient declined No - Patient declined No - Patient declined No - Patient declined -    Current Medications (verified) Outpatient Encounter Medications as of 10/24/2020  Medication Sig   Accu-Chek FastClix Lancets MISC TEST 4 TIMES DAILY. DX E11.9   acetaminophen (TYLENOL) 325 MG tablet Take 2 tablets (650 mg total) by mouth every 6 (six) hours as needed.   albuterol (PROVENTIL) (2.5 MG/3ML) 0.083% nebulizer solution Take 2.5 mg by nebulization at bedtime as needed for wheezing.   albuterol (VENTOLIN HFA) 108 (90 Base) MCG/ACT inhaler Inhale 2 puffs into the lungs daily as needed for wheezing or shortness of breath.   Blood Glucose Calibration  (ACCU-CHEK GUIDE CONTROL) LIQD 1 each by In Vitro route every 30 (thirty) days. Needs Accu-check guide monitor system. DX. E11.9 (Patient taking differently: 1 each by In Vitro route every 30 (thirty) days. Needs Accu-check guide monitor system. DX. E11.9 2 times daily)   calcium-vitamin D (OSCAL WITH D) 500-200 MG-UNIT per tablet Take 1 tablet by mouth daily with breakfast.   Cholecalciferol (VITAMIN D3) 1.25 MG (50000 UT) CAPS Take 1 capsule by mouth once a week.   Continuous Blood Gluc Sensor (DEXCOM G6 SENSOR) MISC 1 Device by Does not apply route as directed.   Continuous Blood Gluc Transmit (DEXCOM G6 TRANSMITTER) MISC 1 Device by Does not apply route as directed.   diclofenac Sodium (VOLTAREN) 1 % GEL Apply 2 g topically 4 (four) times daily.   DULoxetine (CYMBALTA) 60 MG capsule Take 1 capsule (60 mg total) by mouth at bedtime.   empagliflozin (JARDIANCE) 10 MG TABS tablet Take 1 tablet (10 mg total) by mouth daily before breakfast.   ferrous sulfate 325 (65 FE) MG tablet Take 1 tablet (325 mg total) by mouth every other day.   fluticasone (FLONASE) 50 MCG/ACT nasal spray Place 2 sprays into both nostrils daily as needed for allergies.   gabapentin (NEURONTIN) 300 MG capsule 1 capsule twice dailty   glucose blood (ACCU-CHEK GUIDE) test strip Use to test blood sugar 4 times a day.  Dx: E11.9   insulin aspart (NOVOLOG FLEXPEN) 100 UNIT/ML FlexPen Max daily 30 units   insulin glargine (LANTUS SOLOSTAR) 100 UNIT/ML Solostar  Pen Inject 26 Units into the skin daily.   Insulin Pen Needle 31G X 5 MM MISC 1 Units by Does not apply route in the morning, at noon, in the evening, and at bedtime.   meloxicam (MOBIC) 15 MG tablet Take 1 tablet (15 mg total) by mouth daily. With meals   metFORMIN (GLUCOPHAGE-XR) 500 MG 24 hr tablet TAKE 1 TABLET(500 MG) BY MOUTH TWICE DAILY WITH A MEAL   rosuvastatin (CRESTOR) 20 MG tablet Take 1 tablet (20 mg total) by mouth daily.   No facility-administered encounter  medications on file as of 10/24/2020.    Allergies (verified) Cefazolin   History: Past Medical History:  Diagnosis Date   Allergy    Anemia    Anxiety    Arthritis    Back pain    Bipolar disorder (HCC)    CVA (cerebral infarction)    Depression    Diabetes mellitus without complication (HCC)    High cholesterol    Hypertension    Left leg pain    Migraine    Sleep apnea    Stroke [facility])    Past Surgical History:  Procedure Laterality Date   ABDOMINAL HYSTERECTOMY     ANTERIOR LATERAL LUMBAR FUSION WITH PERCUTANEOUS SCREW 1 LEVEL Left 02/11/2018   Procedure: LEFT LATERAL LUMBAR  FOUR-FIVE INTERBODY FUSION WITH INSTRUMENTATION AND ALLOGRAFT;  Surgeon: Phylliss Bob, MD;  Location: [place];  Service: Orthopedics;  Laterality: Left;  LEFT LATERAL LUMBAR  FOUR-FIVE INTERBODY FUSION WITH INSTRUMENTATION AND ALLOGRAFT   BACK SURGERY     Lumbar fusion L5-S1   DIABETES     EYE SURGERY Bilateral    KNEE SURGERY     l 4-l5 status post lateral posterior fusion January 30,2020     NEUROFORAMINAL STENOSIS Left    Severe L4-L5, involving the level above previous fusion.   PARTIAL HYSTERECTOMY     RADICULOPATHY Left    L4, Secondary to an L4-L5 Spondylolisthesis   SHOULDER SURGERY     Family History  Problem Relation Age of Onset   Diabetes Mother    Stroke Father    Cancer Paternal Aunt    Cancer Paternal Aunt    Diabetes Brother    Colon cancer Neg Hx    Esophageal cancer Neg Hx    Rectal cancer Neg Hx    Stomach cancer Neg Hx    Social History   Socioeconomic History   Marital status: Single    Spouse name: Not on file   Number of children: 3   Years of education: 12   Highest education level: Not on file  Occupational History   Not on file  Tobacco Use   Smoking status: Former    Types: Cigarettes    Quit date: 12/13/2015    Years since quitting: 4.8   Smokeless tobacco: Never  Vaping Use   Vaping Use: Never used  Substance and Sexual Activity   Alcohol  use: Yes    Comment: Occas   Drug use: Yes    Types: Marijuana    Comment: last weed use 26 Dec 2018   Sexual activity: Not Currently    Comment: 1st intercourse 60 yo-Fewer than 5 partners  Other Topics Concern   Not on file  Social History Narrative   Patient is single with 3 children.   Patient is right handed.   Patient has a high school education.   Patient drinks at least 3 cups of caffeine daily.   Social Determinants of  Health   Financial Resource Strain: Medium Risk   Difficulty of Paying Living Expenses: Somewhat hard  Food Insecurity: No Food Insecurity   Worried About Running Out of Food in the Last Year: Never true   Ran Out of Food in the Last Year: Never true  Transportation Needs: No Transportation Needs   Lack of Transportation (Medical): No   Lack of Transportation (Non-Medical): No  Physical Activity: Inactive   Days of Exercise per Week: 0 days   Minutes of Exercise per Session: 0 min  Stress: Stress Concern Present   Feeling of Stress : To some extent  Social Connections: Socially Isolated   Frequency of Communication with Friends and Family: Three times a week   Frequency of Social Gatherings with Friends and Family: More than three times a week   Attends Religious Services: Never   Marine scientist or Organizations: No   Attends Music therapist: Never   Marital Status: Separated    Tobacco Counseling Counseling given: Not Answered   Clinical Intake:  Pre-visit preparation completed: Yes  Pain : 0-10 Pain Score: 4  Pain Type: Chronic pain Pain Location: Back Pain Descriptors / Indicators: Burning, Constant Pain Onset: More than a month ago Pain Relieving Factors: gabepentin Effect of Pain on Daily Activities: sometimes  Pain Relieving Factors: gabepentin  Nutritional Risks: None Diabetes: Yes CBG done?: No Did pt. bring in CBG monitor from home?: No  How often do you need to have someone help you when you read  instructions, pamphlets, or other written materials from your doctor or pharmacy?: 1 - Never  Diabetic?yes  Nutrition Risk Assessment:  Has the patient had any N/V/D within the last 2 months?  No  Does the patient have any non-healing wounds?  No  Has the patient had any unintentional weight loss or weight gain?  No   Diabetes:  Is the patient diabetic?  Yes  If diabetic, was a CBG obtained today?  No  Did the patient bring in their glucometer from home?  No  How often do you monitor your CBG's? 2 x daily.   Financial Strains and Diabetes Management:  Are you having any financial strains with the device, your supplies or your medication? No .  Does the patient want to be seen by Chronic Care Management for management of their diabetes?  Yes  Would the patient like to be referred to a Nutritionist or for Diabetic Management?  No   Diabetic Exams:  Diabetic Eye Exam: Completed . Overdue for diabetic eye exam. Pt has been advised about the importance in completing this exam.  Diabetic Foot Exam: Completed . Pt has been advised about the importance in completing this exam.   Interpreter Needed?: No  Information entered by :: Leroy Kennedy LPN   Activities of Daily Living In your present state of health, do you have any difficulty performing the following activities: 10/24/2020  Hearing? N  Vision? N  Difficulty concentrating or making decisions? N  Walking or climbing stairs? N  Dressing or bathing? N  Doing errands, shopping? N  Preparing Food and eating ? N  Using the Toilet? N  In the past six months, have you accidently leaked urine? N  Do you have problems with loss of bowel control? N  Managing your Medications? N  Managing your Finances? N  Some recent data might be hidden    Patient Care Team: Nche, Charlene Brooke, NP as PCP - General (Internal Medicine)  Indicate  any recent Medical Services you may have received from other than [facility] providers in the past year  (date may be approximate).     Assessment:   This is a routine wellness examination for [place].  Hearing/Vision screen Hearing Screening - Comments:: No trouble hearing Vision Screening - Comments:: Up to date Dr. Katy Fitch  Dietary issues and exercise activities discussed: Current Exercise Habits: The patient does not participate in regular exercise at present, Exercise limited by: orthopedic condition(s)   Goals Addressed             This Visit's Progress    Increase physical activity       Patient Stated   On track    Starting 08/18/2017, I will continue to take medications as prescribed.       Depression Screen PHQ 2/9 Scores 10/24/2020 09/22/2019 02/01/2019 08/21/2018 08/25/2017 08/18/2017 04/22/2016  PHQ - 2 Score 3 2 0 1 1 0 0  PHQ- 9 Score 9 10 - - 13 0 -    Fall Risk Fall Risk  10/24/2020 06/29/2020 04/06/2020 02/01/2019 10/20/2018  Falls in the past year? 0 0 0 1 0  Number falls in past yr: 0 0 0 0 -  Injury with Fall? 0 - 0 1 -  Follow up Falls evaluation completed;Falls prevention discussed - - - -    FALL RISK PREVENTION PERTAINING TO THE HOME:  Any stairs in or around the home? No  If so, are there any without handrails? No  Home free of loose throw rugs in walkways, pet beds, electrical cords, etc? Yes  Adequate lighting in your home to reduce risk of falls? Yes   ASSISTIVE DEVICES UTILIZED TO PREVENT FALLS:  Life alert? No  Use of a cane, walker or w/c? Yes  Grab bars in the bathroom? Yes  Shower chair or bench in shower? No  Elevated toilet seat or a handicapped toilet? No   TIMED UP AND GO:  Was the test performed? No .    Cognitive Function:  Normal cognitive status assessed by direct observation by this Nurse Health Advisor. No abnormalities found.   MMSE - Mini Mental State Exam 08/18/2017  Orientation to time 5  Orientation to Place 5  Registration 3  Attention/ Calculation 0  Recall 2  Recall-comments unable to recall 1 of 3 words  Language- name  2 objects 0  Language- repeat 1  Language- follow 3 step command 3  Language- read & follow direction 0  Write a sentence 0  Copy design 0  Total score 19        Immunizations Immunization History  Administered Date(s) Administered   Moderna Sars-Covid-2 Vaccination 07/20/2019, 08/20/2019   Pneumococcal Polysaccharide-23 08/25/2017    TDAP status: Due, Education has been provided regarding the importance of this vaccine. Advised may receive this vaccine at local pharmacy or Health Dept. Aware to provide a copy of the vaccination record if obtained from local pharmacy or Health Dept. Verbalized acceptance and understanding.  Flu Vaccine status: Due, Education has been provided regarding the importance of this vaccine. Advised may receive this vaccine at local pharmacy or Health Dept. Aware to provide a copy of the vaccination record if obtained from local pharmacy or Health Dept. Verbalized acceptance and understanding.  Pneumococcal vaccine status: Due, Education has been provided regarding the importance of this vaccine. Advised may receive this vaccine at local pharmacy or Health Dept. Aware to provide a copy of the vaccination record if obtained from local pharmacy or  Health Dept. Verbalized acceptance and understanding.  Covid-19 vaccine status: Information provided on how to obtain vaccines.   Qualifies for Shingles Vaccine? Yes   Zostavax completed No   Shingrix Completed?: No.    Education has been provided regarding the importance of this vaccine. Patient has been advised to call insurance company to determine out of pocket expense if they have not yet received this vaccine. Advised may also receive vaccine at local pharmacy or Health Dept. Verbalized acceptance and understanding.  Screening Tests Health Maintenance  Topic Date Due   TETANUS/TDAP  Never done   Zoster Vaccines- Shingrix (1 of 2) Never done   COVID-19 Vaccine (3 - Booster for Moderna series) 01/20/2020    INFLUENZA VACCINE  Never done   URINE MICROALBUMIN  09/21/2020   HEMOGLOBIN A1C  03/15/2021   FOOT EXAM  04/06/2021   OPHTHALMOLOGY EXAM  06/14/2021   MAMMOGRAM  05/31/2022   COLONOSCOPY (Pts 45-41yrs Insurance coverage will need to be confirmed)  11/29/2028   Hepatitis C Screening  Completed   HIV Screening  Completed   HPV VACCINES  Aged Out   PAP SMEAR-Modifier  Discontinued    Health Maintenance  Health Maintenance Due  Topic Date Due   TETANUS/TDAP  Never done   Zoster Vaccines- Shingrix (1 of 2) Never done   COVID-19 Vaccine (3 - Booster for Moderna series) 01/20/2020   INFLUENZA VACCINE  Never done   URINE MICROALBUMIN  09/21/2020    Colorectal cancer screening: Type of screening: Colonoscopy. Completed 2020. Repeat every 5 years  Mammogram status: Completed  . Repeat every year    Lung Cancer Screening: (Low Dose CT Chest recommended if Age 18-80 years, 30 pack-year currently smoking OR have quit w/in 15years.) does not qualify.   Lung Cancer Screening Referral:   Additional Screening:  Hepatitis C Screening: does not qualify; Completed 2049  Vision Screening: Recommended annual ophthalmology exams for early detection of glaucoma and other disorders of the eye. Is the patient up to date with their annual eye exam?  Yes  Who is the provider or what is the name of the office in which the patient attends annual eye exams? Dr. Katy Fitch If pt is not established with a provider, would they like to be referred to a provider to establish care? No .   Dental Screening: Recommended annual dental exams for proper oral hygiene  Community Resource Referral / Chronic Care Management: CRR required this visit?  No   CCM required this visit?  No      Plan:     I have personally reviewed and noted the following in the patient's chart:   Medical and social history Use of alcohol, tobacco or illicit drugs  Current medications and supplements including opioid prescriptions.   Functional ability and status Nutritional status Physical activity Advanced directives List of other physicians Hospitalizations, surgeries, and ER visits in previous 12 months Vitals Screenings to include cognitive, depression, and falls Referrals and appointments  In addition, I have reviewed and discussed with patient certain preventive protocols, quality metrics, and best practice recommendations. A written personalized care plan for preventive services as well as general preventive health recommendations were provided to patient.     Leroy Kennedy, LPN   [id number]   Nurse Notes: patient stated she did not want a referral to Phychiatric services yet.    She has an appointment with pain management and would like to start that first.   Will call if she decides she would  like to start services .

## 2020-10-26 ENCOUNTER — Telehealth: Payer: Self-pay | Admitting: Nurse Practitioner

## 2020-10-26 NOTE — Telephone Encounter (Signed)
Pt called requesting refill on Abuterol and Hydrocodone

## 2020-10-30 ENCOUNTER — Telehealth: Payer: Self-pay | Admitting: Nurse Practitioner

## 2020-10-30 DIAGNOSIS — G8929 Other chronic pain: Secondary | ICD-10-CM

## 2020-10-30 DIAGNOSIS — M25562 Pain in left knee: Secondary | ICD-10-CM

## 2020-11-01 NOTE — Telephone Encounter (Signed)
Closing due to duplicate encounter

## 2020-11-01 NOTE — Telephone Encounter (Signed)
Pt state she does not have appointment with pain clinic until end of November 2022. Pt states next appointment with the spine doctor to review her results is scheduled for 11/05/20. She would like a refill until she can be seen by the pain clinic if possible. Please advise

## 2020-11-02 MED ORDER — HYDROCODONE-ACETAMINOPHEN 5-325 MG PO TABS: 1.0000 | ORAL_TABLET | Freq: Every day | ORAL | 0 refills | Status: AC | PRN

## 2020-11-03 NOTE — Telephone Encounter (Signed)
Patient notified and verbalized understanding. 

## 2020-11-06 ENCOUNTER — Encounter (HOSPITAL_COMMUNITY): Payer: Self-pay

## 2020-11-06 ENCOUNTER — Emergency Department (HOSPITAL_COMMUNITY)
Admission: EM | Admit: 2020-11-06 | Discharge: 2020-11-06 | Disposition: A | Discharge status: Home / Self Care | Payer: Medicare HMO | Attending: Emergency Medicine | Admitting: Emergency Medicine

## 2020-11-06 DIAGNOSIS — M79602 Pain in left arm: Secondary | ICD-10-CM | POA: Insufficient documentation

## 2020-11-06 DIAGNOSIS — Z87891 Personal history of nicotine dependence: Secondary | ICD-10-CM | POA: Diagnosis not present

## 2020-11-06 DIAGNOSIS — Z794 Long term (current) use of insulin: Secondary | ICD-10-CM | POA: Diagnosis not present

## 2020-11-06 DIAGNOSIS — I10 Essential (primary) hypertension: Secondary | ICD-10-CM | POA: Diagnosis not present

## 2020-11-06 DIAGNOSIS — Z7984 Long term (current) use of oral hypoglycemic drugs: Secondary | ICD-10-CM | POA: Insufficient documentation

## 2020-11-06 DIAGNOSIS — E119 Type 2 diabetes mellitus without complications: Secondary | ICD-10-CM | POA: Insufficient documentation

## 2020-11-06 MED ORDER — KETOROLAC TROMETHAMINE 30 MG/ML IJ SOLN
30.0000 mg | Freq: Once | INTRAMUSCULAR | Status: AC
  Administered 2020-11-06: 30 mg via INTRAMUSCULAR
  Filled 2020-11-06: qty 1

## 2020-11-06 MED ORDER — NAPROXEN 500 MG PO TABS: 500.0000 mg | ORAL_TABLET | Freq: Two times a day (BID) | ORAL | 0 refills | Status: DC

## 2020-11-06 MED ORDER — METHOCARBAMOL 500 MG PO TABS: 500.0000 mg | ORAL_TABLET | Freq: Two times a day (BID) | ORAL | 0 refills | Status: DC

## 2020-11-06 NOTE — Discharge Instructions (Addendum)
It was pleasure taking care of you today.  As discussed, I suspect yours symptoms are related to carpal tunnel.  I am sending you home with pain medication.  Take as needed.  I have included the number of a different orthopedic surgeon.  Call to schedule an appointment for further evaluation.  Return to the ER for new or worsening symptoms.

## 2020-11-06 NOTE — ED Provider Notes (Addendum)
[facility] DEPT Provider Note   CSN: [id number] Arrival date & time: 11/06/20  1111     History Chief Complaint  Patient presents with   Arm Pain    Autumn Valenzuela is a 60 y.o. female with a past medical history as noted below who presents to the ED due to left arm pain.  Patient states she has been diagnosed with carpal tunnel and states that the pain has progressively worsened over the past few days.  Patient states pain is worse at night.  She currently wears a wrist brace and elbow brace.  She has been evaluated by Dr. Erlinda Hong with orthopedics who confirmed carpal tunnel however, she has not received a phone call back for further follow-up and intervention.  Patient has been taking gabapentin, tramadol, Tylenol, and ibuprofen with no relief.  Patient denies any recent injuries.  Patient is right-hand dominant.  Denies associated edema, erythema, and warmth.  No fever or chills.  Patient states pain radiates from left hand up to elbow.   History obtained from patient and past medical records. No interpreter used during encounter.       Past Medical History:  Diagnosis Date   Allergy    Anemia    Anxiety    Arthritis    Back pain    Bipolar disorder ([place])    CVA (cerebral infarction)    Depression    Diabetes mellitus without complication (HCC)    High cholesterol    Hypertension    Left leg pain    Migraine    Sleep apnea    Stroke [facility])     Patient Active Problem List   Diagnosis Date Noted   Other chronic pain 09/06/2020   Pain in thoracic spine 08/03/2020   Ulnar 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 of left upper extremity 08/09/2019   Pincer nail deformity 05/19/2019   Excessive daytime sleepiness 03/15/2019   Type 2 diabetes mellitus with hyperglycemia, with long-term current use of insulin ([place]) 03/08/2019   Type 2 diabetes mellitus with diabetic polyneuropathy, with long-term current use of insulin ([place]) 03/08/2019   Diabetic neuropathy with  neurologic complication ([place]) 30/09/2328   Claudication of both lower extremities ([place]) 02/04/2019   RLS (restless legs syndrome) 02/04/2019   Sleep deprivation 02/04/2019   Hot flashes 02/04/2019   Neck pain 02/04/2019   Chronic migraine without aura, with intractable migraine, so stated, with status migrainosus 01/27/2019   Acute pain of both knees 06/18/2018   Radiculopathy 02/11/2018   Bipolar disorder ([place]) 03/29/2016   Diabetic ketoacidosis ([place]) 03/24/2016   Dermatitis 03/24/2016   HTN (hypertension) 03/24/2016   HLD (hyperlipidemia) 03/24/2016   DM (diabetes mellitus) ([place]) 03/24/2016   Chronic daily headache 11/23/2012   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   Depression 11/23/2012    Past Surgical History:  Procedure Laterality Date   ABDOMINAL HYSTERECTOMY     ANTERIOR LATERAL LUMBAR FUSION WITH PERCUTANEOUS SCREW 1 LEVEL Left 02/11/2018   Procedure: LEFT LATERAL LUMBAR  FOUR-FIVE INTERBODY FUSION WITH INSTRUMENTATION AND ALLOGRAFT;  Surgeon: Phylliss Bob, MD;  Location: [facility];  Service: Orthopedics;  Laterality: Left;  LEFT LATERAL LUMBAR  FOUR-FIVE INTERBODY FUSION WITH INSTRUMENTATION AND ALLOGRAFT   BACK SURGERY     Lumbar fusion L5-S1   DIABETES     EYE SURGERY Bilateral    KNEE SURGERY     l 4-l5 status post lateral posterior fusion January 30,2020     NEUROFORAMINAL STENOSIS Left    Severe L4-L5,  involving the level above previous fusion.   PARTIAL HYSTERECTOMY     RADICULOPATHY Left    L4, Secondary to an L4-L5 Spondylolisthesis   SHOULDER SURGERY       OB History     Gravida  3   Para  3   Term      Preterm      AB      Living  3      SAB      IAB      Ectopic      Multiple      Live Births              Family History  Problem Relation Age of Onset   Diabetes Mother    Stroke Father    Cancer Paternal Aunt    Cancer Paternal Aunt    Diabetes Brother    Colon cancer Neg Hx    Esophageal cancer  Neg Hx    Rectal cancer Neg Hx    Stomach cancer Neg Hx     Social History   Tobacco Use   Smoking status: Former    Types: Cigarettes    Quit date: 12/13/2015    Years since quitting: 4.9   Smokeless tobacco: Never  Vaping Use   Vaping Use: Never used  Substance Use Topics   Alcohol use: Yes    Comment: Occas   Drug use: Yes    Types: Marijuana    Comment: last weed use 26 Dec 2018    Home Medications Prior to Admission medications   Medication Sig Start Date End Date Taking? Authorizing Provider  Accu-Chek FastClix Lancets MISC TEST 4 TIMES DAILY. DX E11.9 07/16/18   Elby Beck, FNP  acetaminophen (TYLENOL) 325 MG tablet Take 2 tablets (650 mg total) by mouth every 6 (six) hours as needed. 06/14/19   Darr, Edison Nasuti, PA-C  albuterol (PROVENTIL) (2.5 MG/3ML) 0.083% nebulizer solution Take 2.5 mg by nebulization at bedtime as needed for wheezing.    [provider]  albuterol (VENTOLIN HFA) 108 (90 Base) MCG/ACT inhaler Inhale 2 puffs into the lungs daily as needed for wheezing or shortness of breath. 02/19/19   Elby Beck, FNP  Blood Glucose Calibration (ACCU-CHEK GUIDE CONTROL) LIQD 1 each by In Vitro route every 30 (thirty) days. Needs Accu-check guide monitor system. DX. E11.9 Patient taking differently: 1 each by In Vitro route every 30 (thirty) days. Needs Accu-check guide monitor system. DX. E11.9 2 times daily 07/15/18   Elby Beck, FNP  calcium-vitamin D (OSCAL WITH D) 500-200 MG-UNIT per tablet Take 1 tablet by mouth daily with breakfast.    [provider]  Cholecalciferol (VITAMIN D3) 1.25 MG (50000 UT) CAPS Take 1 capsule by mouth once a week. 02/10/20   Tonia Ghent, MD  Continuous Blood Gluc Sensor (DEXCOM G6 SENSOR) MISC 1 Device by Does not apply route as directed. 09/15/20   Shamleffer, Melanie Crazier, MD  Continuous Blood Gluc Transmit (DEXCOM G6 TRANSMITTER) MISC 1 Device by Does not apply route as directed. 09/15/20   Shamleffer,  Melanie Crazier, MD  diclofenac Sodium (VOLTAREN) 1 % GEL Apply 2 g topically 4 (four) times daily. 06/29/20   Nche, Charlene Brooke, NP  DULoxetine (CYMBALTA) 60 MG capsule Take 1 capsule (60 mg total) by mouth at bedtime. 04/06/20   Nche, Charlene Brooke, NP  empagliflozin (JARDIANCE) 10 MG TABS tablet Take 1 tablet (10 mg total) by mouth daily before breakfast. 07/10/20  Shamleffer, Melanie Crazier, MD  ferrous sulfate 325 (65 FE) MG tablet Take 1 tablet (325 mg total) by mouth every other day. 02/08/19   Elby Beck, FNP  fluticasone (FLONASE) 50 MCG/ACT nasal spray Place 2 sprays into both nostrils daily as needed for allergies. 09/22/19   Elby Beck, FNP  gabapentin (NEURONTIN) 300 MG capsule 1 capsule twice dailty 04/06/20   Nche, Charlene Brooke, NP  glucose blood (ACCU-CHEK GUIDE) test strip Use to test blood sugar 4 times a day.  Dx: E11.9 07/16/18   Elby Beck, FNP  HYDROcodone-acetaminophen (NORCO/VICODIN) 5-325 MG tablet Take 1 tablet by mouth daily as needed for up to 7 days for severe pain. Additional refills from pain clinic 11/02/20 11/09/20  Nche, Charlene Brooke, NP  insulin aspart (NOVOLOG FLEXPEN) 100 UNIT/ML FlexPen Max daily 30 units 09/15/20   Shamleffer, Melanie Crazier, MD  insulin glargine (LANTUS SOLOSTAR) 100 UNIT/ML Solostar Pen Inject 26 Units into the skin daily. 09/15/20   Shamleffer, Melanie Crazier, MD  Insulin Pen Needle 31G X 5 MM MISC 1 Units by Does not apply route in the morning, at noon, in the evening, and at bedtime. 09/15/20   Shamleffer, Melanie Crazier, MD  meloxicam (MOBIC) 15 MG tablet Take 1 tablet (15 mg total) by mouth daily. With meals 09/05/20   Nche, Charlene Brooke, NP  metFORMIN (GLUCOPHAGE-XR) 500 MG 24 hr tablet TAKE 1 TABLET(500 MG) BY MOUTH TWICE DAILY WITH A MEAL 08/30/20   Nche, Charlene Brooke, NP  rosuvastatin (CRESTOR) 20 MG tablet Take 1 tablet (20 mg total) by mouth daily. 07/08/20   Nche, Charlene Brooke, NP    Allergies     Cefazolin  Review of Systems   Review of Systems  Constitutional:  Negative for appetite change and fever.  Musculoskeletal:  Positive for arthralgias.  All other systems reviewed and are negative.  Physical Exam Updated Vital Signs BP (!) 151/97 (BP Location: Left Arm)   Pulse 92   Temp 98.4 F (36.9 C) (Oral)   Resp 17   SpO2 99%   Physical Exam Vitals and nursing note reviewed.  Constitutional:      General: She is not in acute distress.    Appearance: She is not ill-appearing.  HENT:     Head: Normocephalic.  Eyes:     Pupils: Pupils are equal, round, and reactive to light.  Neck:     Comments: No cervical midline tenderness Cardiovascular:     Rate and Rhythm: Normal rate and regular rhythm.     Pulses: Normal pulses.     Heart sounds: Normal heart sounds. No murmur heard.   No friction rub. No gallop.  Pulmonary:     Effort: Pulmonary effort is normal.     Breath sounds: Normal breath sounds.  Abdominal:     General: Abdomen is flat. There is no distension.     Palpations: Abdomen is soft.     Tenderness: There is no abdominal tenderness. There is no guarding or rebound.  Musculoskeletal:        General: Normal range of motion.     Cervical back: Neck supple.     Comments: +phalen's test. Radial pulse intact. Soft compartments. No erythema, edema, or warmth.  Skin:    General: Skin is warm and dry.  Neurological:     General: No focal deficit present.     Mental Status: She is alert.  Psychiatric:        Mood and Affect: Mood normal.  Behavior: Behavior normal.    ED Results / Procedures / Treatments   Labs (all labs ordered are listed, but only abnormal results are displayed) Labs Reviewed - No data to display  EKG None  Radiology No results found.  Procedures Procedures   Medications Ordered in ED Medications  ketorolac (TORADOL) 30 MG/ML injection 30 mg (has no administration in time range)    ED Course  I have reviewed the  triage vital signs and the nursing notes.  Pertinent labs & imaging results that were available during my care of the patient were reviewed by me and considered in my medical decision making (see chart for details).    MDM Rules/Calculators/A&P                           60 year old female presents to the ED due to worsening carpal tunnel symptoms in left upper extremity.  Patient has been evaluated by Dr. Erlinda Hong with orthopedics however, has not received follow-up for intervention.  No recent injury.  Patient is right-hand dominant.  Upon arrival, stable vitals.  Patient is afebrile, not tachycardic or hypoxic.  Patient in no acute distress.  Left upper extremity neurovascularly intact with soft compartments.  Low suspicion for compartment syndrome.  No signs of infection.  Low suspicion for cellulitis or septic joint.  Positive Phalen's test. Suspect symptoms related to carpal tunnel.  No cervical midline tenderness. Equal grip strength. Low suspicion for central cord compression. Patient given IM Toradol.  Patient requesting referral to a different orthopedic surgeon.  Referral given.  Advised patient to call today to schedule appoint for further evaluation. Strict ED precautions discussed with patient. Patient states understanding and agrees to plan. Patient discharged home in no acute distress and stable vitals  Final Clinical Impression(s) / ED Diagnoses Final diagnoses:  Left arm pain    Rx / DC Orders ED Discharge Orders     None        Suzy Bouchard, PA-C 11/06/20 1233    Suzy Bouchard, PA-C 11/06/20 1235    Lorelle Gibbs, DO 11/06/20 1509

## 2020-11-06 NOTE — ED Triage Notes (Signed)
Pt presents with c/o left arm pain. Pt reports that she has a hx of carpal tunnel in that left hand and it has gotten worse and is now causing pain all the way up her left arm.

## 2020-11-07 DIAGNOSIS — Z6839 Body mass index (BMI) 39.0-39.9, adult: Secondary | ICD-10-CM | POA: Diagnosis not present

## 2020-11-07 DIAGNOSIS — S32009K Unspecified fracture of unspecified lumbar vertebra, subsequent encounter for fracture with nonunion: Secondary | ICD-10-CM | POA: Diagnosis not present

## 2020-11-08 ENCOUNTER — Other Ambulatory Visit: Payer: Self-pay | Admitting: Neurosurgery

## 2020-11-08 DIAGNOSIS — G5602 Carpal tunnel syndrome, left upper limb: Secondary | ICD-10-CM | POA: Diagnosis not present

## 2020-11-08 DIAGNOSIS — G5622 Lesion of ulnar nerve, left upper limb: Secondary | ICD-10-CM | POA: Diagnosis not present

## 2020-11-14 ENCOUNTER — Ambulatory Visit: Payer: Medicaid Other

## 2020-11-20 DIAGNOSIS — G5602 Carpal tunnel syndrome, left upper limb: Secondary | ICD-10-CM | POA: Diagnosis not present

## 2020-11-20 DIAGNOSIS — M19022 Primary osteoarthritis, left elbow: Secondary | ICD-10-CM | POA: Diagnosis not present

## 2020-11-20 DIAGNOSIS — G5622 Lesion of ulnar nerve, left upper limb: Secondary | ICD-10-CM | POA: Diagnosis not present

## 2020-11-27 ENCOUNTER — Telehealth: Payer: Self-pay | Admitting: Internal Medicine

## 2020-11-27 MED ORDER — ACCU-CHEK GUIDE VI STRP: ORAL_STRIP | 3 refills | Status: DC

## 2020-11-27 NOTE — Telephone Encounter (Signed)
Script sent  

## 2020-11-27 NOTE — Progress Notes (Signed)
Surgical Instructions    Your procedure is scheduled on 12/01/20.  Report to [facility] Main Entrance "A" at 5:30 A.M., then check in with the Admitting office.  Call this number if you have problems the morning of surgery:  [phone]   If you have any questions prior to your surgery date call [phone]: Open Monday-Friday 8am-4pm    Remember:  Do not eat or drink after midnight the night before your surgery      Take these medicines the morning of surgery with A SIP OF WATER  gabapentin (NEURONTIN)  rosuvastatin (CRESTOR)  IF NEEDED: albuterol (PROVENTIL)nebulizer or inhaler (bring inhaler with you the day of surgery) fluticasone (FLONASE)   As of today, STOP taking any Aspirin (unless otherwise instructed by your surgeon) Aleve, Naproxen, Ibuprofen, Motrin, Advil, Goody's, BC's, all herbal medications, fish oil, diclofenac Sodium (VOLTAREN) gel and all vitamins.  WHAT DO I DO ABOUT MY DIABETES MEDICATION?   Do not take oral diabetes medicines (pills) the morning of surgery.  THE NIGHT BEFORE SURGERY, take 13 units (50%) of Insulin Glargine [facility]).      THE MORNING OF SURGERY, do not take metFORMIN (GLUCOPHAGE-XR)   The day of surgery, do not take other diabetes injectables, including Byetta (exenatide), Bydureon (exenatide ER), Victoza (liraglutide), or Trulicity (dulaglutide).  If your CBG is greater than 220 mg/dL, you may take  of your sliding scale (Novolog) dose of insulin.   HOW TO MANAGE YOUR DIABETES BEFORE AND AFTER SURGERY  Why is it important to control my blood sugar before and after surgery? Improving blood sugar levels before and after surgery helps healing and can limit problems. A way of improving blood sugar control is eating a healthy diet by:  Eating less sugar and carbohydrates  Increasing activity/exercise  Talking with your doctor about reaching your blood sugar goals High blood sugars (greater than 180 mg/dL) can raise  your risk of infections and slow your recovery, so you will need to focus on controlling your diabetes during the weeks before surgery. Make sure that the doctor who takes care of your diabetes knows about your planned surgery including the date and location.  How do I manage my blood sugar before surgery? Check your blood sugar at least 4 times a day, starting 2 days before surgery, to make sure that the level is not too high or low.  Check your blood sugar the morning of your surgery when you wake up and every 2 hours until you get to the Short Stay unit.  If your blood sugar is less than 70 mg/dL, you will need to treat for low blood sugar: Do not take insulin. Treat a low blood sugar (less than 70 mg/dL) with  cup of clear juice (cranberry or apple), 4 glucose tablets, OR glucose gel. Recheck blood sugar in 15 minutes after treatment (to make sure it is greater than 70 mg/dL). If your blood sugar is not greater than 70 mg/dL on recheck, call [phone] for further instructions. Report your blood sugar to the short stay nurse when you get to Short Stay.  If you are admitted to the hospital after surgery: Your blood sugar will be checked by the staff and you will probably be given insulin after surgery (instead of oral diabetes medicines) to make sure you have good blood sugar levels. The goal for blood sugar control after surgery is 80-180 mg/dL.    After your COVID test   You are not required to quarantine however you  are required to wear a well-fitting mask when you are out and around people not in your household.  If your mask becomes wet or soiled, replace with a new one.  Wash your hands often with soap and water for 20 seconds or clean your hands with an alcohol-based hand sanitizer that contains at least 60% alcohol.  Do not share personal items.  Notify your provider: if you are in close contact with someone who has COVID  or if you develop a fever of 100.4 or greater,  sneezing, cough, sore throat, shortness of breath or body aches.             Do not wear jewelry or makeup Do not wear lotions, powders, perfumes/colognes, or deodorant. Do not shave 48 hours prior to surgery.   Do not bring valuables to the hospital.  DO Not wear nail polish, gel polish, artificial nails, or any other type of covering on natural nails including finger and toenails. If patients have artificial nails, gel coating, etc. that need to be removed by a nail salon, please have this removed prior to surgery or surgery may need to be canceled/delayed if the surgeon/ anesthesia feels like the patient is unable to be adequately monitored.             [facility] is not responsible for any belongings or valuables.  Do NOT Smoke (Tobacco/Vaping)  24 hours prior to your procedure  If you use a CPAP at night, you may bring your mask for your overnight stay.   Contacts, glasses, hearing aids, dentures or partials may not be worn into surgery, please bring cases for these belongings   For patients admitted to the hospital, discharge time will be determined by your treatment team.   Patients discharged the day of surgery will not be allowed to drive home, and someone needs to stay with them for 24 hours.  NO VISITORS WILL BE ALLOWED IN PRE-OP WHERE PATIENTS ARE PREPPED FOR SURGERY.  ONLY 1 SUPPORT PERSON MAY BE PRESENT IN THE WAITING ROOM WHILE YOU ARE IN SURGERY.  IF YOU ARE TO BE ADMITTED, ONCE YOU ARE IN YOUR ROOM YOU WILL BE ALLOWED TWO (2) VISITORS. 1 (ONE) VISITOR MAY STAY OVERNIGHT BUT MUST ARRIVE TO THE ROOM BY 8pm.  Minor children may have two parents present. Special consideration for safety and communication needs will be reviewed on a case by case basis.  Special instructions:    Oral Hygiene is also important to reduce your risk of infection.  Remember - BRUSH YOUR TEETH THE MORNING OF SURGERY WITH YOUR REGULAR TOOTHPASTE   - Preparing For Surgery  Before  surgery, you can play an important role. Because skin is not sterile, your skin needs to be as free of germs as possible. You can reduce the number of germs on your skin by washing with CHG (chlorahexidine gluconate) Soap before surgery.  CHG is an antiseptic cleaner which kills germs and bonds with the skin to continue killing germs even after washing.     Please do not use if you have an allergy to CHG or antibacterial soaps. If your skin becomes reddened/irritated stop using the CHG.  Do not shave (including legs and underarms) for at least 48 hours prior to first CHG shower. It is OK to shave your face.  Please follow these instructions carefully.     Shower the NIGHT BEFORE SURGERY and the MORNING OF SURGERY with CHG Soap.   If you chose to  wash your hair, wash your hair first as usual with your normal shampoo. After you shampoo, rinse your hair and body thoroughly to remove the shampoo.  Then [employer] and genitals (private parts) with your normal soap and rinse thoroughly to remove soap.  After that Use CHG Soap as you would any other liquid soap. You can apply CHG directly to the skin and wash gently with a scrungie or a clean washcloth.   Apply the CHG Soap to your body ONLY FROM THE NECK DOWN.  Do not use on open wounds or open sores. Avoid contact with your eyes, ears, mouth and genitals (private parts). Wash Face and genitals (private parts)  with your normal soap.   Wash thoroughly, paying special attention to the area where your surgery will be performed.  Thoroughly rinse your body with warm water from the neck down.  DO NOT shower/wash with your normal soap after using and rinsing off the CHG Soap.  Pat yourself dry with a CLEAN TOWEL.  Wear CLEAN PAJAMAS to bed the night before surgery  Place CLEAN SHEETS on your bed the night before your surgery  DO NOT SLEEP WITH PETS.   Day of Surgery: Take a shower with CHG soap. Wear Clean/Comfortable clothing the morning of  surgery Do not apply any deodorants/lotions.   Remember to brush your teeth WITH YOUR REGULAR TOOTHPASTE.   Please read over the following fact sheets that you were given.

## 2020-11-27 NOTE — Telephone Encounter (Signed)
Pt calling in to request refill of glucose blood (ACCU-CHEK GUIDE) test strip   [facility] - [address], [address]  [place], [place] [address]   Pt contact [phone]

## 2020-11-28 ENCOUNTER — Encounter (HOSPITAL_COMMUNITY)
Admission: RE | Admit: 2020-11-28 | Discharge: 2020-11-28 | Disposition: A | Discharge status: Home / Self Care | Payer: Medicare HMO | Source: Ambulatory Visit | Attending: Neurosurgery | Admitting: Neurosurgery

## 2020-11-28 ENCOUNTER — Encounter (HOSPITAL_COMMUNITY): Payer: Self-pay

## 2020-11-28 ENCOUNTER — Other Ambulatory Visit: Payer: Self-pay

## 2020-11-28 VITALS — BP 122/78 | HR 96 | Temp 98.1°F | Resp 18 | Ht 61.0 in | Wt 208.8 lb

## 2020-11-28 DIAGNOSIS — Z20822 Contact with and (suspected) exposure to covid-19: Secondary | ICD-10-CM | POA: Insufficient documentation

## 2020-11-28 DIAGNOSIS — Z79899 Other long term (current) drug therapy: Secondary | ICD-10-CM | POA: Insufficient documentation

## 2020-11-28 DIAGNOSIS — Z01812 Encounter for preprocedural laboratory examination: Secondary | ICD-10-CM | POA: Insufficient documentation

## 2020-11-28 DIAGNOSIS — Z794 Long term (current) use of insulin: Secondary | ICD-10-CM

## 2020-11-28 DIAGNOSIS — I10 Essential (primary) hypertension: Secondary | ICD-10-CM

## 2020-11-28 DIAGNOSIS — E1165 Type 2 diabetes mellitus with hyperglycemia: Secondary | ICD-10-CM

## 2020-11-28 DIAGNOSIS — Z01818 Encounter for other preprocedural examination: Secondary | ICD-10-CM

## 2020-11-28 LAB — TYPE AND SCREEN
ABO/RH(D): A NEG
Antibody Screen: NEGATIVE

## 2020-11-28 LAB — HEMOGLOBIN A1C
Hgb A1c MFr Bld: 7.9 % — ABNORMAL HIGH (ref 4.8–5.6)
Mean Plasma Glucose: 180.03 mg/dL

## 2020-11-28 LAB — GLUCOSE, CAPILLARY: Glucose-Capillary: 200 mg/dL — ABNORMAL HIGH (ref 70–99)

## 2020-11-28 LAB — CBC
HCT: 41.1 % (ref 36.0–46.0)
Hemoglobin: 12.9 g/dL (ref 12.0–15.0)
MCH: 27.8 pg (ref 26.0–34.0)
MCHC: 31.4 g/dL (ref 30.0–36.0)
MCV: 88.6 fL (ref 80.0–100.0)
Platelets: 260 10*3/uL (ref 150–400)
RBC: 4.64 MIL/uL (ref 3.87–5.11)
RDW: 15 % (ref 11.5–15.5)
WBC: 11.5 10*3/uL — ABNORMAL HIGH (ref 4.0–10.5)
nRBC: 0 % (ref 0.0–0.2)

## 2020-11-28 LAB — BASIC METABOLIC PANEL
Anion gap: 8 (ref 5–15)
BUN: 9 mg/dL (ref 6–20)
CO2: 28 mmol/L (ref 22–32)
Calcium: 9.3 mg/dL (ref 8.9–10.3)
Chloride: 103 mmol/L (ref 98–111)
Creatinine, Ser: 0.63 mg/dL (ref 0.44–1.00)
GFR, Estimated: >60 mL/min (ref >60)
Glucose, Bld: 197 mg/dL — ABNORMAL HIGH (ref 70–99)
Potassium: 4.8 mmol/L (ref 3.5–5.1)
Sodium: 139 mmol/L (ref 135–145)

## 2020-11-28 LAB — SURGICAL PCR SCREEN
MRSA, PCR: NEGATIVE
Staphylococcus aureus: NEGATIVE

## 2020-11-28 LAB — SARS CORONAVIRUS 2 (TAT 6-24 HRS): SARS Coronavirus 2: NEGATIVE

## 2020-11-28 NOTE — Progress Notes (Signed)
PCP - Wilfred Lacy, NP Cardiologist - Denies  Chest x-ray - Not indicated EKG - 05/29/20 Stress Test - Denies ECHO - 07/08/05 Cardiac Cath - Denies  Sleep Study - 02/26/19 No OSA  DM - Type II Fasting Blood Sugar - 81-121 CBG @ PAT appt 200 with coffee Checks Blood Sugar ___4__ times a day  COVID TEST- 11/28/20  Anesthesia review: No  Patient denies shortness of breath, fever, cough and chest pain at PAT appointment   All instructions explained to the patient, with a verbal understanding of the material. Patient agrees to go over the instructions while at home for a better understanding. Patient also instructed to wear a mask while in public after being tested for COVID-19. The opportunity to ask questions was provided.

## 2020-11-30 NOTE — Anesthesia Preprocedure Evaluation (Addendum)
Anesthesia Evaluation  Patient identified by MRN, date of birth, ID band Patient awake    Reviewed: Allergy & Precautions, NPO status , Patient's Chart, lab work & pertinent test results  History of Anesthesia Complications Negative for: history of anesthetic complications  Airway Mallampati: III  TM Distance: >3 FB Neck ROM: Full    Dental  (+) Dental Advisory Given   Pulmonary sleep apnea , former smoker,    Pulmonary exam normal        Cardiovascular hypertension (no meds), Normal cardiovascular exam     Neuro/Psych  Headaches, PSYCHIATRIC DISORDERS Anxiety Depression Bipolar Disorder  Ulnar neuropathy left arm RLS TIA Neuromuscular disease    GI/Hepatic negative GI ROS, (+)     substance abuse  marijuana use,   Endo/Other  diabetes, Type 2, Oral Hypoglycemic Agents, Insulin DependentMorbid obesity  Renal/GU negative Renal ROS     Musculoskeletal  (+) Arthritis ,   Abdominal (+) + obese,   Peds  Hematology negative hematology ROS (+)   Anesthesia Other Findings   Reproductive/Obstetrics                           Anesthesia Physical Anesthesia Plan  ASA: 3  Anesthesia Plan: General   Post-op Pain Management:    Induction: Intravenous  PONV Risk Score and Plan: 3 and Treatment may vary due to age or medical condition, Ondansetron, Dexamethasone and Midazolam  Airway Management Planned: Oral ETT  Additional Equipment: None  Intra-op Plan:   Post-operative Plan: Extubation in OR  Informed Consent: I have reviewed the patients History and Physical, chart, labs and discussed the procedure including the risks, benefits and alternatives for the proposed anesthesia with the patient or authorized representative who has indicated his/her understanding and acceptance.     Dental advisory given  Plan Discussed with: CRNA and Anesthesiologist  Anesthesia Plan Comments:         Anesthesia Quick Evaluation

## 2020-12-01 ENCOUNTER — Inpatient Hospital Stay (HOSPITAL_COMMUNITY): Payer: Medicare HMO

## 2020-12-01 ENCOUNTER — Other Ambulatory Visit: Payer: Self-pay

## 2020-12-01 ENCOUNTER — Inpatient Hospital Stay (HOSPITAL_COMMUNITY)
Admission: RE | Admit: 2020-12-01 | Discharge: 2020-12-02 | DRG: 460 | Disposition: A | Discharge status: Home / Self Care | Payer: Medicare HMO | Attending: Neurosurgery | Admitting: Neurosurgery

## 2020-12-01 ENCOUNTER — Encounter (HOSPITAL_COMMUNITY)
Admission: RE | Disposition: A | Discharge status: Home / Self Care | Payer: Self-pay | Source: Home / Self Care | Attending: Neurosurgery

## 2020-12-01 ENCOUNTER — Encounter (HOSPITAL_COMMUNITY): Payer: Self-pay | Admitting: Neurosurgery

## 2020-12-01 ENCOUNTER — Inpatient Hospital Stay (HOSPITAL_COMMUNITY): Payer: Medicare HMO | Admitting: Certified Registered"

## 2020-12-01 DIAGNOSIS — M4326 Fusion of spine, lumbar region: Secondary | ICD-10-CM | POA: Diagnosis not present

## 2020-12-01 DIAGNOSIS — T84226A Displacement of internal fixation device of vertebrae, initial encounter: Secondary | ICD-10-CM | POA: Diagnosis present

## 2020-12-01 DIAGNOSIS — M5416 Radiculopathy, lumbar region: Secondary | ICD-10-CM | POA: Diagnosis present

## 2020-12-01 DIAGNOSIS — G43711 Chronic migraine without aura, intractable, with status migrainosus: Secondary | ICD-10-CM | POA: Diagnosis not present

## 2020-12-01 DIAGNOSIS — S32009K Unspecified fracture of unspecified lumbar vertebra, subsequent encounter for fracture with nonunion: Secondary | ICD-10-CM | POA: Diagnosis present

## 2020-12-01 DIAGNOSIS — G4730 Sleep apnea, unspecified: Secondary | ICD-10-CM | POA: Diagnosis present

## 2020-12-01 DIAGNOSIS — E119 Type 2 diabetes mellitus without complications: Secondary | ICD-10-CM | POA: Diagnosis present

## 2020-12-01 DIAGNOSIS — Y831 Surgical operation with implant of artificial internal device as the cause of abnormal reaction of the patient, or of later complication, without mention of misadventure at the time of the procedure: Secondary | ICD-10-CM | POA: Diagnosis present

## 2020-12-01 DIAGNOSIS — Z981 Arthrodesis status: Secondary | ICD-10-CM | POA: Diagnosis not present

## 2020-12-01 DIAGNOSIS — M960 Pseudarthrosis after fusion or arthrodesis: Secondary | ICD-10-CM | POA: Diagnosis not present

## 2020-12-01 DIAGNOSIS — Z833 Family history of diabetes mellitus: Secondary | ICD-10-CM

## 2020-12-01 DIAGNOSIS — Z823 Family history of stroke: Secondary | ICD-10-CM | POA: Diagnosis not present

## 2020-12-01 DIAGNOSIS — Z87891 Personal history of nicotine dependence: Secondary | ICD-10-CM

## 2020-12-01 DIAGNOSIS — Z419 Encounter for procedure for purposes other than remedying health state, unspecified: Secondary | ICD-10-CM

## 2020-12-01 DIAGNOSIS — T84296A Other mechanical complication of internal fixation device of vertebrae, initial encounter: Secondary | ICD-10-CM | POA: Diagnosis not present

## 2020-12-01 DIAGNOSIS — Z881 Allergy status to other antibiotic agents status: Secondary | ICD-10-CM

## 2020-12-01 DIAGNOSIS — Z20822 Contact with and (suspected) exposure to covid-19: Secondary | ICD-10-CM | POA: Diagnosis present

## 2020-12-01 DIAGNOSIS — Z7984 Long term (current) use of oral hypoglycemic drugs: Secondary | ICD-10-CM

## 2020-12-01 DIAGNOSIS — Z794 Long term (current) use of insulin: Secondary | ICD-10-CM | POA: Diagnosis not present

## 2020-12-01 DIAGNOSIS — E7800 Pure hypercholesterolemia, unspecified: Secondary | ICD-10-CM | POA: Diagnosis not present

## 2020-12-01 LAB — GLUCOSE, CAPILLARY
Glucose-Capillary: 196 mg/dL — ABNORMAL HIGH (ref 70–99)
Glucose-Capillary: 176 mg/dL — ABNORMAL HIGH (ref 70–99)
Glucose-Capillary: 257 mg/dL — ABNORMAL HIGH (ref 70–99)
Glucose-Capillary: 244 mg/dL — ABNORMAL HIGH (ref 70–99)

## 2020-12-01 SURGERY — POSTERIOR LUMBAR FUSION 1 WITH HARDWARE REMOVAL
Anesthesia: General | Site: Spine Lumbar

## 2020-12-01 MED ORDER — LACTATED RINGERS IV SOLN: INTRAVENOUS | Status: DC

## 2020-12-01 MED ORDER — LIDOCAINE-EPINEPHRINE 1 %-1:100000 IJ SOLN
INTRAMUSCULAR | Status: AC
  Filled 2020-12-01: qty 1

## 2020-12-01 MED ORDER — FERROUS SULFATE 325 (65 FE) MG PO TABS: 325.0000 mg | ORAL_TABLET | ORAL | Status: DC

## 2020-12-01 MED ORDER — DEXMEDETOMIDINE (PRECEDEX) IN NS 20 MCG/5ML (4 MCG/ML) IV SYRINGE
PREFILLED_SYRINGE | INTRAVENOUS | Status: DC | PRN
  Administered 2020-12-01: 20 ug via INTRAVENOUS

## 2020-12-01 MED ORDER — DULOXETINE HCL 30 MG PO CPEP: 60.0000 mg | ORAL_CAPSULE | Freq: Every day | ORAL | Status: DC

## 2020-12-01 MED ORDER — DEXCOM G6 SENSOR MISC: 1.0000 | Status: DC

## 2020-12-01 MED ORDER — FENTANYL CITRATE (PF) 250 MCG/5ML IJ SOLN
INTRAMUSCULAR | Status: AC
  Filled 2020-12-01: qty 5

## 2020-12-01 MED ORDER — OXYCODONE HCL 5 MG PO TABS: 5.0000 mg | ORAL_TABLET | Freq: Once | ORAL | Status: AC | PRN

## 2020-12-01 MED ORDER — ROCURONIUM BROMIDE 100 MG/10ML IV SOLN
INTRAVENOUS | Status: DC | PRN
  Administered 2020-12-01: 60 mg via INTRAVENOUS
  Administered 2020-12-01 (×2): 20 mg via INTRAVENOUS

## 2020-12-01 MED ORDER — SUCCINYLCHOLINE CHLORIDE 200 MG/10ML IV SOSY
PREFILLED_SYRINGE | INTRAVENOUS | Status: AC
  Filled 2020-12-01: qty 10

## 2020-12-01 MED ORDER — ONDANSETRON HCL 4 MG/2ML IJ SOLN
INTRAMUSCULAR | Status: DC | PRN
  Administered 2020-12-01: 4 mg via INTRAVENOUS

## 2020-12-01 MED ORDER — SODIUM CHLORIDE 0.9% FLUSH: 3.0000 mL | INTRAVENOUS | Status: DC | PRN

## 2020-12-01 MED ORDER — PROMETHAZINE HCL 25 MG/ML IJ SOLN: 6.2500 mg | INTRAMUSCULAR | Status: DC | PRN

## 2020-12-01 MED ORDER — HYDROMORPHONE HCL 1 MG/ML IJ SOLN
INTRAMUSCULAR | Status: AC
  Filled 2020-12-01: qty 0.5

## 2020-12-01 MED ORDER — FENTANYL CITRATE (PF) 100 MCG/2ML IJ SOLN
INTRAMUSCULAR | Status: AC
  Filled 2020-12-01: qty 2

## 2020-12-01 MED ORDER — ACCU-CHEK GUIDE CONTROL VI LIQD: 1.0000 | Status: DC

## 2020-12-01 MED ORDER — GABAPENTIN 300 MG PO CAPS
300.0000 mg | ORAL_CAPSULE | Freq: Two times a day (BID) | ORAL | Status: DC
  Administered 2020-12-01 – 2020-12-02 (×2): 300 mg via ORAL
  Filled 2020-12-01 (×2): qty 1

## 2020-12-01 MED ORDER — FENTANYL CITRATE (PF) 100 MCG/2ML IJ SOLN
INTRAMUSCULAR | Status: AC
  Administered 2020-12-01: 50 ug via INTRAVENOUS
  Filled 2020-12-01: qty 2

## 2020-12-01 MED ORDER — CHLORHEXIDINE GLUCONATE CLOTH 2 % EX PADS: 6.0000 | MEDICATED_PAD | Freq: Once | CUTANEOUS | Status: DC

## 2020-12-01 MED ORDER — PANTOPRAZOLE SODIUM 40 MG PO TBEC
40.0000 mg | DELAYED_RELEASE_TABLET | Freq: Every day | ORAL | Status: DC
  Administered 2020-12-01: 40 mg via ORAL
  Filled 2020-12-01: qty 1

## 2020-12-01 MED ORDER — FLUTICASONE PROPIONATE 50 MCG/ACT NA SUSP
2.0000 | Freq: Every day | NASAL | Status: DC | PRN
  Filled 2020-12-01: qty 16

## 2020-12-01 MED ORDER — CALCIUM CARBONATE-VITAMIN D 500-200 MG-UNIT PO TABS: 1.0000 | ORAL_TABLET | ORAL | Status: DC

## 2020-12-01 MED ORDER — ROCURONIUM BROMIDE 10 MG/ML (PF) SYRINGE
PREFILLED_SYRINGE | INTRAVENOUS | Status: AC
  Filled 2020-12-01: qty 10

## 2020-12-01 MED ORDER — BUPIVACAINE LIPOSOME 1.3 % IJ SUSP
INTRAMUSCULAR | Status: DC | PRN
  Administered 2020-12-01: 20 mL

## 2020-12-01 MED ORDER — THROMBIN 20000 UNITS EX SOLR
CUTANEOUS | Status: AC
  Filled 2020-12-01: qty 20000

## 2020-12-01 MED ORDER — DEXAMETHASONE SODIUM PHOSPHATE 10 MG/ML IJ SOLN
INTRAMUSCULAR | Status: DC | PRN
  Administered 2020-12-01: 10 mg via INTRAVENOUS

## 2020-12-01 MED ORDER — THROMBIN 20000 UNITS EX SOLR: CUTANEOUS | Status: DC | PRN

## 2020-12-01 MED ORDER — PANTOPRAZOLE SODIUM 40 MG IV SOLR: 40.0000 mg | Freq: Every day | INTRAVENOUS | Status: DC

## 2020-12-01 MED ORDER — OYSTER SHELL CALCIUM/D3 500-5 MG-MCG PO TABS
1.0000 | ORAL_TABLET | ORAL | Status: DC
Start: 2020-12-07 — End: 2020-12-02

## 2020-12-01 MED ORDER — LIDOCAINE-EPINEPHRINE 1 %-1:100000 IJ SOLN
INTRAMUSCULAR | Status: DC | PRN
  Administered 2020-12-01: 10 mL

## 2020-12-01 MED ORDER — LIDOCAINE 2% (20 MG/ML) 5 ML SYRINGE
INTRAMUSCULAR | Status: DC | PRN
  Administered 2020-12-01: 60 mg via INTRAVENOUS

## 2020-12-01 MED ORDER — BUPIVACAINE LIPOSOME 1.3 % IJ SUSP
INTRAMUSCULAR | Status: AC
  Filled 2020-12-01: qty 20

## 2020-12-01 MED ORDER — SODIUM CHLORIDE 0.9 % IV SOLN
250.0000 mL | INTRAVENOUS | Status: DC
  Administered 2020-12-01: 250 mL via INTRAVENOUS

## 2020-12-01 MED ORDER — PROPOFOL 10 MG/ML IV BOLUS
INTRAVENOUS | Status: AC
  Filled 2020-12-01: qty 20

## 2020-12-01 MED ORDER — ACETAMINOPHEN 500 MG PO TABS
1000.0000 mg | ORAL_TABLET | Freq: Once | ORAL | Status: AC
  Administered 2020-12-01: 1000 mg via ORAL
  Filled 2020-12-01: qty 2

## 2020-12-01 MED ORDER — ORAL CARE MOUTH RINSE: 15.0000 mL | Freq: Once | OROMUCOSAL | Status: AC

## 2020-12-01 MED ORDER — ACETAMINOPHEN 325 MG PO TABS
650.0000 mg | ORAL_TABLET | ORAL | Status: DC | PRN
  Administered 2020-12-01 – 2020-12-02 (×2): 650 mg via ORAL
  Filled 2020-12-01 (×2): qty 2

## 2020-12-01 MED ORDER — SUGAMMADEX SODIUM 200 MG/2ML IV SOLN
INTRAVENOUS | Status: DC | PRN
  Administered 2020-12-01: 200 mg via INTRAVENOUS

## 2020-12-01 MED ORDER — BASAGLAR KWIKPEN 100 UNIT/ML ~~LOC~~ SOPN: 26.0000 [IU] | PEN_INJECTOR | Freq: Every day | SUBCUTANEOUS | Status: DC

## 2020-12-01 MED ORDER — ALBUTEROL SULFATE (2.5 MG/3ML) 0.083% IN NEBU: 2.5000 mg | INHALATION_SOLUTION | Freq: Four times a day (QID) | RESPIRATORY_TRACT | Status: DC | PRN

## 2020-12-01 MED ORDER — OXYCODONE HCL 5 MG/5ML PO SOLN: 5.0000 mg | Freq: Once | ORAL | Status: AC | PRN

## 2020-12-01 MED ORDER — OXYCODONE HCL 5 MG PO TABS
ORAL_TABLET | ORAL | Status: AC
  Administered 2020-12-01: 5 mg via ORAL
  Filled 2020-12-01: qty 1

## 2020-12-01 MED ORDER — ONDANSETRON HCL 4 MG/2ML IJ SOLN: 4.0000 mg | Freq: Four times a day (QID) | INTRAMUSCULAR | Status: DC | PRN

## 2020-12-01 MED ORDER — ROSUVASTATIN CALCIUM 20 MG PO TABS: 20.0000 mg | ORAL_TABLET | Freq: Every day | ORAL | Status: DC

## 2020-12-01 MED ORDER — OXYCODONE HCL 5 MG PO TABS
10.0000 mg | ORAL_TABLET | ORAL | Status: DC | PRN
  Administered 2020-12-01 – 2020-12-02 (×5): 10 mg via ORAL
  Filled 2020-12-01 (×5): qty 2

## 2020-12-01 MED ORDER — METFORMIN HCL ER 500 MG PO TB24
500.0000 mg | ORAL_TABLET | Freq: Two times a day (BID) | ORAL | Status: DC
  Administered 2020-12-01 – 2020-12-02 (×2): 500 mg via ORAL
  Filled 2020-12-01 (×2): qty 1

## 2020-12-01 MED ORDER — CYCLOBENZAPRINE HCL 10 MG PO TABS
10.0000 mg | ORAL_TABLET | Freq: Three times a day (TID) | ORAL | Status: DC | PRN
  Administered 2020-12-01: 10 mg via ORAL
  Filled 2020-12-01: qty 1

## 2020-12-01 MED ORDER — CEFAZOLIN SODIUM-DEXTROSE 2-3 GM-%(50ML) IV SOLR
INTRAVENOUS | Status: DC | PRN
  Administered 2020-12-01: 2 g via INTRAVENOUS

## 2020-12-01 MED ORDER — PHENYLEPHRINE 40 MCG/ML (10ML) SYRINGE FOR IV PUSH (FOR BLOOD PRESSURE SUPPORT)
PREFILLED_SYRINGE | INTRAVENOUS | Status: DC | PRN
  Administered 2020-12-01: 120 ug via INTRAVENOUS
  Administered 2020-12-01 (×3): 80 ug via INTRAVENOUS

## 2020-12-01 MED ORDER — HYDROMORPHONE HCL 1 MG/ML IJ SOLN
0.5000 mg | INTRAMUSCULAR | Status: DC | PRN
  Administered 2020-12-01: 0.5 mg via INTRAVENOUS
  Filled 2020-12-01: qty 0.5

## 2020-12-01 MED ORDER — LIDOCAINE 2% (20 MG/ML) 5 ML SYRINGE
INTRAMUSCULAR | Status: AC
  Filled 2020-12-01: qty 5

## 2020-12-01 MED ORDER — DEXCOM G6 TRANSMITTER MISC: 1.0000 | Status: DC

## 2020-12-01 MED ORDER — VANCOMYCIN HCL IN DEXTROSE 1-5 GM/200ML-% IV SOLN
1000.0000 mg | INTRAVENOUS | Status: AC
  Administered 2020-12-01: 1000 mg via INTRAVENOUS
  Filled 2020-12-01: qty 200

## 2020-12-01 MED ORDER — 0.9 % SODIUM CHLORIDE (POUR BTL) OPTIME
TOPICAL | Status: DC | PRN
  Administered 2020-12-01: 1000 mL

## 2020-12-01 MED ORDER — SODIUM CHLORIDE 0.9% FLUSH
3.0000 mL | Freq: Two times a day (BID) | INTRAVENOUS | Status: DC
  Administered 2020-12-01 (×2): 3 mL via INTRAVENOUS

## 2020-12-01 MED ORDER — PHENOL 1.4 % MT LIQD: 1.0000 | OROMUCOSAL | Status: DC | PRN

## 2020-12-01 MED ORDER — DICLOFENAC SODIUM 1 % EX GEL
2.0000 g | Freq: Four times a day (QID) | CUTANEOUS | Status: DC | PRN
  Filled 2020-12-01: qty 100

## 2020-12-01 MED ORDER — CEFAZOLIN SODIUM-DEXTROSE 2-4 GM/100ML-% IV SOLN
2.0000 g | Freq: Three times a day (TID) | INTRAVENOUS | Status: AC
  Administered 2020-12-01 – 2020-12-02 (×2): 2 g via INTRAVENOUS
  Filled 2020-12-01 (×2): qty 100

## 2020-12-01 MED ORDER — DEXAMETHASONE SODIUM PHOSPHATE 10 MG/ML IJ SOLN
INTRAMUSCULAR | Status: AC
  Filled 2020-12-01: qty 1

## 2020-12-01 MED ORDER — INSULIN GLARGINE-YFGN 100 UNIT/ML ~~LOC~~ SOLN
26.0000 [IU] | Freq: Every day | SUBCUTANEOUS | Status: DC
  Administered 2020-12-01: 26 [IU] via SUBCUTANEOUS
  Filled 2020-12-01 (×2): qty 0.26

## 2020-12-01 MED ORDER — ALUM & MAG HYDROXIDE-SIMETH 200-200-20 MG/5ML PO SUSP: 30.0000 mL | Freq: Four times a day (QID) | ORAL | Status: DC | PRN

## 2020-12-01 MED ORDER — ONDANSETRON HCL 4 MG/2ML IJ SOLN
INTRAMUSCULAR | Status: AC
  Filled 2020-12-01: qty 2

## 2020-12-01 MED ORDER — CHLORHEXIDINE GLUCONATE 0.12 % MT SOLN
15.0000 mL | Freq: Once | OROMUCOSAL | Status: AC
  Administered 2020-12-01: 15 mL via OROMUCOSAL
  Filled 2020-12-01: qty 15

## 2020-12-01 MED ORDER — FENTANYL CITRATE (PF) 100 MCG/2ML IJ SOLN
INTRAMUSCULAR | Status: DC | PRN
  Administered 2020-12-01 (×3): 50 ug via INTRAVENOUS
  Administered 2020-12-01: 100 ug via INTRAVENOUS

## 2020-12-01 MED ORDER — PROPOFOL 10 MG/ML IV BOLUS
INTRAVENOUS | Status: DC | PRN
  Administered 2020-12-01: 160 mg via INTRAVENOUS

## 2020-12-01 MED ORDER — ACETAMINOPHEN 650 MG RE SUPP: 650.0000 mg | RECTAL | Status: DC | PRN

## 2020-12-01 MED ORDER — EPHEDRINE SULFATE-NACL 50-0.9 MG/10ML-% IV SOSY
PREFILLED_SYRINGE | INTRAVENOUS | Status: DC | PRN
  Administered 2020-12-01 (×2): 10 mg via INTRAVENOUS

## 2020-12-01 MED ORDER — ALBUTEROL SULFATE HFA 108 (90 BASE) MCG/ACT IN AERS: 2.0000 | INHALATION_SPRAY | Freq: Every day | RESPIRATORY_TRACT | Status: DC | PRN

## 2020-12-01 MED ORDER — FENTANYL CITRATE (PF) 100 MCG/2ML IJ SOLN
25.0000 ug | INTRAMUSCULAR | Status: DC | PRN
  Administered 2020-12-01 (×2): 50 ug via INTRAVENOUS

## 2020-12-01 MED ORDER — MIDAZOLAM HCL 5 MG/5ML IJ SOLN
INTRAMUSCULAR | Status: DC | PRN
  Administered 2020-12-01: 2 mg via INTRAVENOUS

## 2020-12-01 MED ORDER — MIDAZOLAM HCL 2 MG/2ML IJ SOLN
INTRAMUSCULAR | Status: AC
  Filled 2020-12-01: qty 2

## 2020-12-01 MED ORDER — MENTHOL 3 MG MT LOZG: 1.0000 | LOZENGE | OROMUCOSAL | Status: DC | PRN

## 2020-12-01 MED ORDER — PHENYLEPHRINE HCL-NACL 20-0.9 MG/250ML-% IV SOLN
INTRAVENOUS | Status: DC | PRN
  Administered 2020-12-01: 30 ug/min via INTRAVENOUS

## 2020-12-01 MED ORDER — PHENYLEPHRINE 40 MCG/ML (10ML) SYRINGE FOR IV PUSH (FOR BLOOD PRESSURE SUPPORT)
PREFILLED_SYRINGE | INTRAVENOUS | Status: AC
  Filled 2020-12-01: qty 10

## 2020-12-01 MED ORDER — ONDANSETRON HCL 4 MG PO TABS: 4.0000 mg | ORAL_TABLET | Freq: Four times a day (QID) | ORAL | Status: DC | PRN

## 2020-12-01 SURGICAL SUPPLY — 69 items
BAG COUNTER SPONGE SURGICOUNT (BAG) ×2 IMPLANT
BASKET BONE COLLECTION (BASKET) ×2 IMPLANT
BENZOIN TINCTURE PRP APPL 2/3 (GAUZE/BANDAGES/DRESSINGS) ×2 IMPLANT
BLADE CLIPPER SURG (BLADE) IMPLANT
BLADE SURG 11 STRL SS (BLADE) ×2 IMPLANT
BONE CANC CHIPS 20CC PCAN1/4 (Bone Implant) ×2 IMPLANT
BONE VIVIGEN FORMABLE 5.4CC (Bone Implant) ×2 IMPLANT
BUR CUTTER 7.0 ROUND (BURR) ×2 IMPLANT
BUR MATCHSTICK NEURO 3.0 LAGG (BURR) ×2 IMPLANT
CANISTER SUCT 3000ML PPV (MISCELLANEOUS) ×2 IMPLANT
CARTRIDGE OIL MAESTRO DRILL (MISCELLANEOUS) ×1 IMPLANT
CHIPS CANC BONE 20CC PCAN1/4 (Bone Implant) ×1 IMPLANT
CNTNR URN SCR LID CUP LEK RST (MISCELLANEOUS) ×1 IMPLANT
CONT SPEC 4OZ STRL OR WHT (MISCELLANEOUS) ×2
COVER BACK TABLE 60X90IN (DRAPES) ×2 IMPLANT
DECANTER SPIKE VIAL GLASS SM (MISCELLANEOUS) ×2 IMPLANT
DERMABOND ADVANCED (GAUZE/BANDAGES/DRESSINGS) ×1
DERMABOND ADVANCED .7 DNX12 (GAUZE/BANDAGES/DRESSINGS) ×1 IMPLANT
DIFFUSER DRILL AIR PNEUMATIC (MISCELLANEOUS) ×2 IMPLANT
DRAPE C-ARM 42X72 X-RAY (DRAPES) ×4 IMPLANT
DRAPE C-ARMOR (DRAPES) IMPLANT
DRAPE HALF SHEET 40X57 (DRAPES) IMPLANT
DRAPE LAPAROTOMY 100X72X124 (DRAPES) ×2 IMPLANT
DRAPE SURG 17X23 STRL (DRAPES) ×2 IMPLANT
DRSG OPSITE 4X5.5 SM (GAUZE/BANDAGES/DRESSINGS) ×2 IMPLANT
DRSG OPSITE POSTOP 4X6 (GAUZE/BANDAGES/DRESSINGS) ×2 IMPLANT
DURAPREP 26ML APPLICATOR (WOUND CARE) ×2 IMPLANT
ELECT REM PT RETURN 9FT ADLT (ELECTROSURGICAL) ×2
ELECTRODE REM PT RTRN 9FT ADLT (ELECTROSURGICAL) ×1 IMPLANT
EVACUATOR 3/16  PVC DRAIN (DRAIN) ×2
EVACUATOR 3/16 PVC DRAIN (DRAIN) ×1 IMPLANT
GAUZE 4X4 16PLY ~~LOC~~+RFID DBL (SPONGE) ×4 IMPLANT
GAUZE SPONGE 4X4 12PLY STRL (GAUZE/BANDAGES/DRESSINGS) ×2 IMPLANT
GLOVE EXAM NITRILE XL STR (GLOVE) IMPLANT
GLOVE SURG ENC MOIS LTX SZ7 (GLOVE) IMPLANT
GLOVE SURG ENC MOIS LTX SZ8 (GLOVE) ×4 IMPLANT
GLOVE SURG UNDER LTX SZ8.5 (GLOVE) ×4 IMPLANT
GLOVE SURG UNDER POLY LF SZ7 (GLOVE) IMPLANT
GOWN STRL REUS W/ TWL LRG LVL3 (GOWN DISPOSABLE) IMPLANT
GOWN STRL REUS W/ TWL XL LVL3 (GOWN DISPOSABLE) ×2 IMPLANT
GOWN STRL REUS W/TWL 2XL LVL3 (GOWN DISPOSABLE) IMPLANT
GOWN STRL REUS W/TWL LRG LVL3 (GOWN DISPOSABLE)
GOWN STRL REUS W/TWL XL LVL3 (GOWN DISPOSABLE) ×4
KIT BASIN OR (CUSTOM PROCEDURE TRAY) ×2 IMPLANT
KIT GRAFTMAG DEL NEURO DISP (NEUROSURGERY SUPPLIES) IMPLANT
KIT INFUSE X SMALL 1.4CC (Orthopedic Implant) ×2 IMPLANT
KIT TURNOVER KIT B (KITS) ×2 IMPLANT
MILL MEDIUM DISP (BLADE) ×2 IMPLANT
NEEDLE HYPO 21X1.5 SAFETY (NEEDLE) ×2 IMPLANT
NEEDLE HYPO 25X1 1.5 SAFETY (NEEDLE) ×2 IMPLANT
NS IRRIG 1000ML POUR BTL (IV SOLUTION) ×4 IMPLANT
OIL CARTRIDGE MAESTRO DRILL (MISCELLANEOUS) ×2
PACK LAMINECTOMY NEURO (CUSTOM PROCEDURE TRAY) ×2 IMPLANT
PAD ARMBOARD 7.5X6 YLW CONV (MISCELLANEOUS) ×6 IMPLANT
ROD PRE LORDOSED 5.5X45 (Rod) ×4 IMPLANT
SCREW SET SINGLE INNER (Screw) ×8 IMPLANT
SCREW VIPER 8X40 TI CORTI (Screw) ×4 IMPLANT
SPONGE SURGIFOAM ABS GEL 100 (HEMOSTASIS) ×2 IMPLANT
SPONGE T-LAP 4X18 ~~LOC~~+RFID (SPONGE) ×4 IMPLANT
STRIP CLOSURE SKIN 1/2X4 (GAUZE/BANDAGES/DRESSINGS) ×4 IMPLANT
SUT VIC AB 0 CT1 18XCR BRD8 (SUTURE) ×2 IMPLANT
SUT VIC AB 0 CT1 8-18 (SUTURE) ×4
SUT VIC AB 2-0 CT1 18 (SUTURE) ×2 IMPLANT
SUT VIC AB 4-0 PS2 27 (SUTURE) ×2 IMPLANT
SYR 20ML LL LF (SYRINGE) IMPLANT
TOWEL GREEN STERILE (TOWEL DISPOSABLE) ×2 IMPLANT
TOWEL GREEN STERILE FF (TOWEL DISPOSABLE) ×2 IMPLANT
TRAY FOLEY MTR SLVR 16FR STAT (SET/KITS/TRAYS/PACK) ×2 IMPLANT
WATER STERILE IRR 1000ML POUR (IV SOLUTION) ×2 IMPLANT

## 2020-12-01 NOTE — Op Note (Signed)
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  So due to the patient's failed conservative treatment imaging findings and progression of clinical syndrome I recommended reexploration fusion move of hardware with replacement of bilateral loose L4 screws redo posterior lateral arthrodesis.  Extensive went over the risks and benefits of that procedure with her as well as perioperative course expectations of outcome and alternatives of surgery and she understood and agreed to proceed forward.  Operative procedure: Patient was brought into the OR was [facility] general anesthesia positioned prone Wilson frame her back was prepped and draped in routine sterile fashion.  Roll incision was opened up the scar tissue was dissected free and subperiosteal dissection carried lamina of L3 I then identified the L4 pedicle screw dissected out the hardware at L4-5 identified the L4 and L5 TPs lateral facet joints and pars.  I then disconnected the knots remove the rods and removed bilateral L4 screws which were noted to be loose.  In addition the right L4 screw was cross threaded so it stripped the threads off the inside of the screw.  After both screws removed I dissected out and identified the PA TPs lateral facet joints and pars at L4 and L5.  Aggressively irrigated the wound and aggressively decorticated the TPs at both levels at both sides as well as  lateral pars and lateral facet joints with then I placed the BMP and vivid gin and cancellous chips posterior laterally TP to TP and along the lateral facet joints and lateral pars.  Then under fluoroscopy I replaced both L4 screws with 1 size larger 8 oh by 40s.  The screws had excellent purchase then connected the rod anchored everything in place it placed some additional bone graft posterior laterally injected Exparel in the fascia placed a medium Hemovac drain and closed the wound in layers with interrupted Vicryl and a running 4 subcuticular.  Dermabond benzoin Steri-Strips and a sterile dressing was applied patient recovery in stable condition.  At the end the case all needle counts and sponge counts were correct.

## 2020-12-01 NOTE — Anesthesia Procedure Notes (Signed)
Procedure Name: Intubation Date/Time: 12/01/2020 7:50 AM Performed by: Sharee Pimple, CRNA Pre-anesthesia Checklist: Patient identified Patient Re-evaluated:Patient Re-evaluated prior to induction Oxygen Delivery Method: Circle system utilized Preoxygenation: Pre-oxygenation with 100% oxygen Induction Type: IV induction Ventilation: Mask ventilation without difficulty Laryngoscope Size: Mac and 3 Grade View: Grade II Tube type: Oral Tube size: 7.5 mm Number of attempts: 1 Airway Equipment and Method: Stylet Placement Confirmation: ETT inserted through vocal cords under direct vision, positive ETCO2, CO2 detector and breath sounds checked- equal and bilateral Secured at: 22 cm Dental Injury: Teeth and Oropharynx as per pre-operative assessment

## 2020-12-01 NOTE — Anesthesia Postprocedure Evaluation (Signed)
Anesthesia Post Note  Patient: Autumn Valenzuela  Procedure(s) Performed: POSTERIOR LATERAL FUSION LUMBAR FOUR-FIVE, REDO ARTHRODESIS WITH REPLACEMENT OF BILATERAL LUMBAR FOUR PEDICLE SCREWS (Spine Lumbar)     Patient location during evaluation: PACU Anesthesia Type: General Level of consciousness: awake and alert Pain management: pain level controlled Vital Signs Assessment: post-procedure vital signs reviewed and stable Respiratory status: spontaneous breathing, nonlabored ventilation and respiratory function stable Cardiovascular status: stable and blood pressure returned to baseline Anesthetic complications: no   No notable events documented.  Last Vitals:  Vitals:   12/01/20 1117 12/01/20 1135  BP: 116/83 121/77  Pulse: 67 64  Resp: 18 20  Temp:  36.6 C  SpO2: 96% 100%    Last Pain:  Vitals:   12/01/20 1135  TempSrc: Oral  PainSc:                  [name]

## 2020-12-01 NOTE — Transfer of Care (Signed)
Immediate Anesthesia Transfer of Care Note  Patient: Autumn Valenzuela  Procedure(s) Performed: POSTERIOR LATERAL FUSION LUMBAR FOUR-FIVE, REDO ARTHRODESIS WITH REPLACEMENT OF BILATERAL LUMBAR FOUR PEDICLE SCREWS (Spine Lumbar)  Patient Location: PACU  Anesthesia Type:General  Level of Consciousness: awake, alert  and oriented  Airway & Oxygen Therapy: Patient Spontanous Breathing and Patient connected to nasal cannula oxygen  Post-op Assessment: Report given to RN, Post -op Vital signs reviewed and stable and Patient moving all extremities  Post vital signs: Reviewed and stable  Last Vitals:  Vitals Value Taken Time  BP 111/73 12/01/20 1025  Temp 37 C 12/01/20 1025  Pulse 63 12/01/20 1025  Resp 11 12/01/20 1025  SpO2 100 % 12/01/20 1025  Vitals shown include unvalidated device data.  Last Pain:  Vitals:   12/01/20 1025  TempSrc: Tympanic  PainSc:       Patients Stated Pain Goal: 3 (08/65/78 4696)  Complications: No notable events documented.

## 2020-12-01 NOTE — Evaluation (Signed)
Occupational Therapy Evaluation and Discharge Patient Details Name: Autumn Valenzuela MRN: [id number] DOB: December 19, 1960 Today's Date: 12/01/2020   History of Present Illness This 59 yo femael s/p reexploration fusion move of hardware L4-5, replacement bilateral L4 pedicle screws, and posterior lateral arthrodesis L4-5.   Clinical Impression   This 59 yo female admitted and underwent above presents to acute OT with all education completed, we will D/C from acute OT.      Recommendations for follow up therapy are one component of a multi-disciplinary discharge planning process, led by the attending physician.  Recommendations may be updated based on patient status, additional functional criteria and insurance authorization.   Follow Up Recommendations  No OT follow up    Assistance Recommended at Discharge Intermittent Supervision/Assistance  Functional Status Assessment  Patient has had a recent decline in their functional status and demonstrates the ability to make significant improvements in function in a reasonable and predictable amount of time.  Equipment Recommendations  Tub/shower seat       Precautions / Restrictions Precautions Precautions: Back Precaution Booklet Issued: Yes (comment) Precaution Comments: No brace needed per orders Restrictions Weight Bearing Restrictions: No      Mobility Bed Mobility Overal bed mobility: Modified Independent             General bed mobility comments: Educated on proper technique    Transfers Overall transfer level: Needs assistance Equipment used: Rolling walker (2 wheels) Transfers: Sit to/from Stand Sit to Stand: Supervision                  Balance Overall balance assessment: Mild deficits observed, not formally tested                                         ADL either performed or assessed with clinical judgement   ADL Overall ADL's : Needs assistance/impaired Eating/Feeding:  Independent;Sitting     Grooming Details (indicate cue type and reason): Educated on using 2 cups for brushing teeth to avoid bending over the sink Upper Body Bathing: Set up;Sitting   Lower Body Bathing: Set up;Sit to/from stand Lower Body Bathing Details (indicate cue type and reason): Can cross legs to get to feet, but struggles with LLE, does have long handled sponge to use Upper Body Dressing : Set up;Sitting   Lower Body Dressing: Set up;Sit to/from stand Lower Body Dressing Details (indicate cue type and reason): Can cross legs to get to feet, but struggles with LLE, does have sock aid and reacher Toilet Transfer: Supervision/safety;Ambulation;Rolling walker (2 wheels) Toilet Transfer Details (indicate cue type and reason): simulated bed>out and down hall>back to room to sit on EOB   Toileting - Clothing Manipulation Details (indicate cue type and reason): Educated on use of wet wipes for back peri care   Tub/Shower Transfer Details (indicate cue type and reason): Educated on side stepping into tub   General ADL Comments: Educated on sit<>stand stance that helps to keep back more straight as well as not sitting for more than 20-30 mintues (building up to 1 hour)     Vision Patient Visual Report: No change from baseline              Pertinent Vitals/Pain Pain Assessment: 0-10 Pain Score: 9  Pain Location: incisional Pain Descriptors / Indicators: Aching;Sore Pain Intervention(s): Limited activity within patient's tolerance;Monitored during session;Premedicated before session     Hand  Dominance Right   Extremity/Trunk Assessment Upper Extremity Assessment Upper Extremity Assessment: Overall WFL for tasks assessed           Communication Communication Communication: No difficulties   Cognition Arousal/Alertness: Awake/alert Behavior During Therapy: WFL for tasks assessed/performed Overall Cognitive Status: Within Functional Limits for tasks assessed                                                   Home Living Family/patient expects to be discharged to:: Private residence Living Arrangements: Alone Available Help at Discharge: Family;Available 24 hours/day (initially --mom) Type of Home: House Home Access: Stairs to enter [employer] of Steps: 2 Entrance Stairs-Rails: Right;Left;Can reach both Home Layout: One level     Bathroom Shower/Tub: Tub/shower unit;Curtain   Biochemist, clinical: Standard     Home Equipment: Conservation officer, nature (2 wheels);Cane - single point;Adaptive equipment;Grab bars - tub/shower Adaptive Equipment: Long-handled shoe horn;Sock aid;Reacher;Long-handled sponge        Prior Functioning/Environment Prior Level of Function : Independent/Modified Independent                        OT Problem List: Decreased range of motion;Impaired balance (sitting and/or standing);Pain         OT Goals(Current goals can be found in the care plan section) Acute Rehab OT Goals Patient Stated Goal: to go home tomorrow OT Goal Formulation: With patient                AM-PAC OT "6 Clicks" Daily Activity     Outcome Measure Help from another person eating meals?: None Help from another person taking care of personal grooming?: None Help from another person toileting, which includes using toliet, bedpan, or urinal?: None Help from another person bathing (including washing, rinsing, drying)?: None Help from another person to put on and taking off regular upper body clothing?: None Help from another person to put on and taking off regular lower body clothing?: None 6 Click Score: 24   End of Session Equipment Utilized During Treatment: Rolling walker (2 wheels) Nurse Communication:  (no further OT needs, need for tub seat for home via chat text)  Activity Tolerance: Patient tolerated treatment well Patient left:  (sitting EOB)  OT Visit Diagnosis: Pain Pain - part of body:  (incisional)                 Time: 1962-2297 OT Time Calculation (min): 17 min Charges:  OT General Charges $OT Visit: 1 Visit OT Evaluation $OT Eval Moderate Complexity: 1 Mod  Golden Circle, OTR/L Acute [employer] Pager [phone] Office [phone]    Almon Register 12/01/2020, 3:42 PM

## 2020-12-01 NOTE — H&P (Signed)
Autumn Valenzuela is an 60 y.o. female.   Chief Complaint: Back left hip and leg pain HPI: 60 year old female has previously had an L4-S1 fusion over successive operations and most recent one was L4-5.  Patient has had persistent back and left L4-L5 radicular symptoms work-up has revealed pseudoarthrosis at L4-5 possible loosening of the left L4 screw.  Due to patient's progression of clinical syndrome imaging findings and failed conservative treatment I recommended reexploration fusion removal of hardware possible removal of both L4 screws but definitely the left.  Redo posterior lateral arthrodesis at L4-5 and replacement of left L4 screw.  I have extensively gone over the risks and benefits of the operation with her as well as perioperative course expectations of outcome and alternatives of surgery and she understands and agrees to proceed forward.  Past Medical History:  Diagnosis Date   Allergy    Anemia    Anxiety    Arthritis    Back pain    Bipolar disorder (HCC)    CVA (cerebral infarction)    Depression    Diabetes mellitus without complication (HCC)    High cholesterol    Hypertension    Left leg pain    Migraine    Sleep apnea    Stroke [facility])     Past Surgical History:  Procedure Laterality Date   ABDOMINAL HYSTERECTOMY     ANTERIOR LATERAL LUMBAR FUSION WITH PERCUTANEOUS SCREW 1 LEVEL Left 02/11/2018   Procedure: LEFT LATERAL LUMBAR  FOUR-FIVE INTERBODY FUSION WITH INSTRUMENTATION AND ALLOGRAFT;  Surgeon: Phylliss Bob, MD;  Location: [facility];  Service: Orthopedics;  Laterality: Left;  LEFT LATERAL LUMBAR  FOUR-FIVE INTERBODY FUSION WITH INSTRUMENTATION AND ALLOGRAFT   BACK SURGERY     Lumbar fusion L5-S1   DIABETES     EYE SURGERY Bilateral    KNEE SURGERY     l 4-l5 status post lateral posterior fusion January 30,2020     NEUROFORAMINAL STENOSIS Left    Severe L4-L5, involving the level above previous fusion.   PARTIAL HYSTERECTOMY     RADICULOPATHY Left    L4,  Secondary to an L4-L5 Spondylolisthesis   SHOULDER SURGERY      Family History  Problem Relation Age of Onset   Diabetes Mother    Stroke Father    Cancer Paternal Aunt    Cancer Paternal Aunt    Diabetes Brother    Colon cancer Neg Hx    Esophageal cancer Neg Hx    Rectal cancer Neg Hx    Stomach cancer Neg Hx    Social History:  reports that she quit smoking about 4 years ago. Her smoking use included cigarettes. She has never used smokeless tobacco. She reports current alcohol use. She reports current drug use. Drug: Marijuana.  Allergies:  Allergies  Allergen Reactions   Cefazolin Swelling and Other (See Comments)    Rapid iv infusion caused swelling and burning at the iv site    Medications Prior to Admission  Medication Sig Dispense Refill   albuterol (PROVENTIL) (2.5 MG/3ML) 0.083% nebulizer solution Take 2.5 mg by nebulization every 6 (six) hours as needed for wheezing.     albuterol (VENTOLIN HFA) 108 (90 Base) MCG/ACT inhaler Inhale 2 puffs into the lungs daily as needed for wheezing or shortness of breath. 18 g 2   calcium-vitamin D (OSCAL WITH D) 500-200 MG-UNIT per tablet Take 1 tablet by mouth once a week.     diclofenac Sodium (VOLTAREN) 1 % GEL Apply 2  g topically 4 (four) times daily. (Patient taking differently: Apply 2 g topically 4 (four) times daily as needed (pain).) 100 g 1   DULoxetine (CYMBALTA) 60 MG capsule Take 1 capsule (60 mg total) by mouth at bedtime. 90 capsule 3   ferrous sulfate 325 (65 FE) MG tablet Take 1 tablet (325 mg total) by mouth every other day. (Patient taking differently: Take 325 mg by mouth once a week.) 45 tablet 3   fluticasone (FLONASE) 50 MCG/ACT nasal spray Place 2 sprays into both nostrils daily as needed for allergies. 16 g 5   gabapentin (NEURONTIN) 300 MG capsule 1 capsule twice dailty (Patient taking differently: Take 300 mg by mouth 2 (two) times daily.) 180 capsule 3   insulin aspart (NOVOLOG FLEXPEN) 100 UNIT/ML FlexPen  Max daily 30 units (Patient taking differently: Inject 1-4 Units into the skin 3 (three) times daily as needed (blood sugar over 150). Max daily 30 units) 15 mL 6   Insulin Glargine (BASAGLAR KWIKPEN) 100 UNIT/ML Inject 26 Units into the skin at bedtime.     metFORMIN (GLUCOPHAGE-XR) 500 MG 24 hr tablet TAKE 1 TABLET(500 MG) BY MOUTH TWICE DAILY WITH A MEAL 180 tablet 0   rosuvastatin (CRESTOR) 20 MG tablet Take 1 tablet (20 mg total) by mouth daily. 90 tablet 3   Accu-Chek FastClix Lancets MISC TEST 4 TIMES DAILY. DX E11.9 306 each 3   Blood Glucose Calibration (ACCU-CHEK GUIDE CONTROL) LIQD 1 each by In Vitro route every 30 (thirty) days. Needs Accu-check guide monitor system. DX. E11.9 (Patient taking differently: 1 each by In Vitro route every 30 (thirty) days. Needs Accu-check guide monitor system. DX. E11.9 2 times daily) 3 each 3   Cholecalciferol (VITAMIN D3) 1.25 MG (50000 UT) CAPS Take 1 capsule by mouth once a week. (Patient not taking: No sig reported) 4 capsule 0   Continuous Blood Gluc Sensor (DEXCOM G6 SENSOR) MISC 1 Device by Does not apply route as directed. 9 each 3   Continuous Blood Gluc Transmit (DEXCOM G6 TRANSMITTER) MISC 1 Device by Does not apply route as directed. 1 each 3   empagliflozin (JARDIANCE) 10 MG TABS tablet Take 1 tablet (10 mg total) by mouth daily before breakfast. (Patient not taking: No sig reported) 30 tablet 6   glucose blood (ACCU-CHEK GUIDE) test strip Use to test blood sugar 4 times a day.  Dx: E11.9 400 each 3   insulin glargine (LANTUS SOLOSTAR) 100 UNIT/ML Solostar Pen Inject 26 Units into the skin daily. (Patient not taking: No sig reported) 30 mL 3   Insulin Pen Needle 31G X 5 MM MISC 1 Units by Does not apply route in the morning, at noon, in the evening, and at bedtime. 400 each 1   meloxicam (MOBIC) 15 MG tablet Take 1 tablet (15 mg total) by mouth daily. With meals (Patient not taking: No sig reported) 90 tablet 0   methocarbamol (ROBAXIN) 500 MG  tablet Take 1 tablet (500 mg total) by mouth 2 (two) times daily. (Patient not taking: No sig reported) 20 tablet 0   naproxen (NAPROSYN) 500 MG tablet Take 1 tablet (500 mg total) by mouth 2 (two) times daily. (Patient not taking: No sig reported) 30 tablet 0    Results for orders placed or performed during the hospital encounter of 12/01/20 (from the past 48 hour(s))  Glucose, capillary     Status: Abnormal   Collection Time: 12/01/20  5:51 AM  Result Value Ref Range   Glucose-Capillary  196 (H) 70 - 99 mg/dL    Comment: Glucose reference range applies only to samples taken after fasting for at least 8 hours.   No results found.  Review of Systems  Musculoskeletal:  Positive for back pain.  Neurological:  Positive for numbness.   Blood pressure 122/72, pulse 80, temperature 98.4 F (36.9 C), temperature source Oral, resp. rate 18, height 5\' 1"  (1.549 m), weight 94.3 kg, SpO2 100 %. Physical Exam HENT:     Head: Normocephalic.     Right Ear: Tympanic membrane normal.     Nose: Nose normal.  Eyes:     Pupils: Pupils are equal, round, and reactive to light.  Cardiovascular:     Rate and Rhythm: Normal rate.  Pulmonary:     Effort: Pulmonary effort is normal.  Abdominal:     General: Abdomen is flat.  Musculoskeletal:        General: Normal range of motion.  Skin:    General: Skin is warm.  Neurological:     General: No focal deficit present.     Mental Status: She is alert.     Comments: Strength is 5 and 5 iliopsoas, quads, hamstrings, gastrocs, into tibialis, and EHL.     Assessment/Plan 60 year old presents for redo posterior lateral arthrodesis at L4-5 with removal and replacement hardware  Elaina Hoops, MD 12/01/2020, 7:29 AM

## 2020-12-02 LAB — GLUCOSE, CAPILLARY: Glucose-Capillary: 176 mg/dL — ABNORMAL HIGH (ref 70–99)

## 2020-12-02 MED ORDER — CYCLOBENZAPRINE HCL 10 MG PO TABS: 10.0000 mg | ORAL_TABLET | Freq: Three times a day (TID) | ORAL | 0 refills | Status: DC | PRN

## 2020-12-02 MED ORDER — OXYCODONE HCL 10 MG PO TABS: 10.0000 mg | ORAL_TABLET | ORAL | 0 refills | Status: DC | PRN

## 2020-12-02 NOTE — Plan of Care (Signed)
Patient alert and oriented, mae's well, voiding adequate amount of urine, swallowing without difficulty, no c/o pain at time of discharge. Patient discharged home with family. Script and discharged instructions given to patient. Patient and family stated understanding of instructions given. Patient has an appointment with Dr. Cram 

## 2020-12-02 NOTE — Discharge Summary (Signed)
Physician Discharge Summary  Patient ID: Autumn Valenzuela MRN: [id number] DOB/AGE: September 27, 1960 60 y.o.  Admit date: 12/01/2020 Discharge date: 12/02/2020  Admission Diagnoses: Pseudoarthrosis L4-L5 with lumbar radiculopathy, back pain  Discharge Diagnoses: Pseudoarthrosis L4-L5 with lumbar radiculopathy.  Back pain. Principal Problem:   Pseudoarthrosis of lumbar spine   Discharged Condition: good  Hospital Course: Patient was admitted to undergo surgical revision of pseudoarthrosis at L4-L5.  Patient tolerated surgery well.  She is ambulatory.  She feels better in regards to his back pain.  Discharged home.  Consults: None  Significant Diagnostic Studies: None  Treatments: surgery: See op note  Discharge Exam: Blood pressure 127/75, pulse 74, temperature 98.2 F (36.8 C), temperature source Oral, resp. rate 16, height 5\' 1"  (1.549 m), weight 94.3 kg, SpO2 94 %. Incision is clean and dry.  Station and gait are intact.  Disposition: Discharge disposition: 01-Home or Self Care       Discharge Instructions     Call MD for:  redness, tenderness, or signs of infection (pain, swelling, redness, odor or green/yellow discharge around incision site)   Complete by: As directed    Call MD for:  severe uncontrolled pain   Complete by: As directed    Call MD for:  temperature >100.4   Complete by: As directed    Diet - low sodium heart healthy   Complete by: As directed    Discharge wound care:   Complete by: As directed    Per Dr.   Kalman Drape activity slowly   Complete by: As directed       Allergies as of 12/02/2020   No Active Allergies      Medication List     TAKE these medications    Accu-Chek FastClix Lancets Misc TEST 4 TIMES DAILY. DX E11.9   Accu-Chek Guide Control Liqd 1 each by In Vitro route every 30 (thirty) days. Needs Accu-check guide monitor system. DX. E11.9 What changed: additional instructions   Accu-Chek Guide test strip Generic drug: glucose  blood Use to test blood sugar 4 times a day.  Dx: E11.9   albuterol 108 (90 Base) MCG/ACT inhaler Commonly known as: VENTOLIN HFA Inhale 2 puffs into the lungs daily as needed for wheezing or shortness of breath.   albuterol (2.5 MG/3ML) 0.083% nebulizer solution Commonly known as: PROVENTIL Take 2.5 mg by nebulization every 6 (six) hours as needed for wheezing.   calcium-vitamin D 500-200 MG-UNIT tablet Commonly known as: OSCAL WITH D Take 1 tablet by mouth once a week.   cyclobenzaprine 10 MG tablet Commonly known as: FLEXERIL Take 1 tablet (10 mg total) by mouth 3 (three) times daily as needed for muscle spasms.   Dexcom G6 Sensor Misc 1 Device by Does not apply route as directed.   Dexcom G6 Transmitter Misc 1 Device by Does not apply route as directed.   diclofenac Sodium 1 % Gel Commonly known as: Voltaren Apply 2 g topically 4 (four) times daily. What changed:  when to take this reasons to take this   DULoxetine 60 MG capsule Commonly known as: CYMBALTA Take 1 capsule (60 mg total) by mouth at bedtime.   empagliflozin 10 MG Tabs tablet Commonly known as: Jardiance Take 1 tablet (10 mg total) by mouth daily before breakfast.   ferrous sulfate 325 (65 FE) MG tablet Take 1 tablet (325 mg total) by mouth every other day. What changed: when to take this   fluticasone 50 MCG/ACT nasal spray Commonly known as: [place]  2 sprays into both nostrils daily as needed for allergies.   gabapentin 300 MG capsule Commonly known as: NEURONTIN 1 capsule twice dailty What changed:  how much to take how to take this when to take this additional instructions   Insulin Pen Needle 31G X 5 MM Misc 1 Units by Does not apply route in the morning, at noon, in the evening, and at bedtime.   Basaglar KwikPen 100 UNIT/ML Inject 26 Units into the skin at bedtime.   Lantus SoloStar 100 UNIT/ML Solostar Pen Generic drug: insulin glargine Inject 26 Units into the skin  daily.   meloxicam 15 MG tablet Commonly known as: Mobic Take 1 tablet (15 mg total) by mouth daily. With meals   metFORMIN 500 MG 24 hr tablet Commonly known as: GLUCOPHAGE-XR TAKE 1 TABLET(500 MG) BY MOUTH TWICE DAILY WITH A MEAL   methocarbamol 500 MG tablet Commonly known as: ROBAXIN Take 1 tablet (500 mg total) by mouth 2 (two) times daily.   naproxen 500 MG tablet Commonly known as: NAPROSYN Take 1 tablet (500 mg total) by mouth 2 (two) times daily.   NovoLOG FlexPen 100 UNIT/ML FlexPen Generic drug: insulin aspart Max daily 30 units What changed:  how much to take how to take this when to take this reasons to take this   Oxycodone HCl 10 MG Tabs Take 1 tablet (10 mg total) by mouth every 3 (three) hours as needed for severe pain ((score 7 to 10)).   rosuvastatin 20 MG tablet Commonly known as: Crestor Take 1 tablet (20 mg total) by mouth daily.   Vitamin D3 1.25 MG (50000 UT) Caps Take 1 capsule by mouth once a week.               Discharge Care Instructions  (From admission, onward)           Start     Ordered   12/02/20 0000  Discharge wound care:       Comments: Per Dr.   12/02/20 0911             Signed: Blanchie Dessert Adanna Zuckerman 12/02/2020, 9:11 AM

## 2020-12-02 NOTE — Evaluation (Signed)
Physical Therapy Evaluation and Discharge Patient Details Name: Autumn Valenzuela MRN: [id number] DOB: [birth year] Today's Date: 12/02/2020  History of Present Illness  Pt presents s/p reexploration fusion move of hardware L4-5, replacement bilateral L4 pedicle screws, and posterior lateral arthrodesis L4-5 on 11/30/2020. Significant PMH: prior back sx, bipolar disorder, DM2.  Clinical Impression  Patient evaluated by Physical Therapy with no further acute PT needs identified. Pt reports resolution of radicular pain and denies numbness/tingling. Displays full, equal strength in bilateral lower extremities. Ambulating hallway distances with a walker and negotiated a half flight of stairs with right railing, without physical difficulty. Education reinforced regarding spinal precautions, activity recommendations, car transfer technique. All education has been completed and the patient has no further questions. No follow-up Physical Therapy or equipment needs. PT is signing off. Thank you for this referral.      Recommendations for follow up therapy are one component of a multi-disciplinary discharge planning process, led by the attending physician.  Recommendations may be updated based on patient status, additional functional criteria and insurance authorization.  Follow Up Recommendations No PT follow up    Assistance Recommended at Discharge Intermittent Supervision/Assistance  Functional Status Assessment Patient has had a recent decline in their functional status and demonstrates the ability to make significant improvements in function in a reasonable and predictable amount of time.  Equipment Recommendations  None recommended by PT    Recommendations for Other Services       Precautions / Restrictions Precautions Precautions: Back Precaution Booklet Issued: Yes (comment) Precaution Comments: No brace needed per orders Restrictions Weight Bearing Restrictions: No      Mobility  Bed  Mobility               General bed mobility comments: Sitting EOB upon arrival    Transfers Overall transfer level: Modified independent Equipment used: Rolling walker (2 wheels);None                    Ambulation/Gait Ambulation/Gait assistance: Modified independent (Device/Increase time) Gait Distance (Feet): 100 Feet Assistive device: Rolling walker (2 wheels) Gait Pattern/deviations: WFL(Within Functional Limits)       General Gait Details: Good posture, pace, overall steady.  Stairs Stairs: Yes Stairs assistance: Modified independent (Device/Increase time) Stair Management: One rail Right Number of Stairs: 12 General stair comments: step over step pattern  Wheelchair Mobility    Modified Rankin (Stroke Patients Only)       Balance Overall balance assessment: No apparent balance deficits (not formally assessed)                                           Pertinent Vitals/Pain Pain Assessment: Faces Faces Pain Scale: Hurts a little bit Pain Location: incisional Pain Descriptors / Indicators: Aching;Sore Pain Intervention(s): Monitored during session    Home Living Family/patient expects to be discharged to:: Private residence Living Arrangements: Alone Available Help at Discharge: Family;Available 24 hours/day Type of Home: House Home Access: Stairs to enter Entrance Stairs-Rails: Right;Left;Can reach both Entrance Stairs-Number of Steps: 2   Home Layout: One level Home Equipment: Conservation officer, nature (2 wheels);Cane - single point;Adaptive equipment;Grab bars - tub/shower      Prior Function Prior Level of Function : Independent/Modified Independent                     Hand Dominance   Dominant Hand:  Right    Extremity/Trunk Assessment   Upper Extremity Assessment Upper Extremity Assessment: Overall WFL for tasks assessed    Lower Extremity Assessment Lower Extremity Assessment: RLE deficits/detail;LLE  deficits/detail RLE Deficits / Details: Strength 5/5 LLE Deficits / Details: Strength 5/5    Cervical / Trunk Assessment Cervical / Trunk Assessment: Back Surgery  Communication   Communication: No difficulties  Cognition Arousal/Alertness: Awake/alert Behavior During Therapy: WFL for tasks assessed/performed Overall Cognitive Status: Within Functional Limits for tasks assessed                                          General Comments      Exercises     Assessment/Plan    PT Assessment Patient does not need any further PT services  PT Problem List         PT Treatment Interventions      PT Goals (Current goals can be found in the Care Plan section)  Acute Rehab PT Goals Patient Stated Goal: go home PT Goal Formulation: All assessment and education complete, DC therapy    Frequency     Barriers to discharge        Co-evaluation               AM-PAC PT "6 Clicks" Mobility  Outcome Measure Help needed turning from your back to your side while in a flat bed without using bedrails?: None Help needed moving from lying on your back to sitting on the side of a flat bed without using bedrails?: None Help needed moving to and from a bed to a chair (including a wheelchair)?: None Help needed standing up from a chair using your arms (e.g., wheelchair or bedside chair)?: None Help needed to walk in hospital room?: None Help needed climbing 3-5 steps with a railing? : None 6 Click Score: 24    End of Session   Activity Tolerance: Patient tolerated treatment well Patient left: in bed;with call bell/phone within reach Nurse Communication: Mobility status PT Visit Diagnosis: Pain Pain - part of body:  (back)    Time: 9323-5573 PT Time Calculation (min) (ACUTE ONLY): 9 min   Charges:   PT Evaluation $PT Eval Low Complexity: [place], PT, DPT Acute Rehabilitation Services Pager [phone] Office  [phone]   Deno Etienne 12/02/2020, 7:42 AM

## 2020-12-02 NOTE — Discharge Instructions (Signed)

## 2020-12-06 ENCOUNTER — Telehealth: Payer: Self-pay | Admitting: Nurse Practitioner

## 2020-12-06 ENCOUNTER — Ambulatory Visit: Payer: Medicaid Other | Admitting: Nurse Practitioner

## 2020-12-06 DIAGNOSIS — G5602 Carpal tunnel syndrome, left upper limb: Secondary | ICD-10-CM | POA: Diagnosis not present

## 2020-12-06 DIAGNOSIS — E118 Type 2 diabetes mellitus with unspecified complications: Secondary | ICD-10-CM

## 2020-12-06 DIAGNOSIS — G5622 Lesion of ulnar nerve, left upper limb: Secondary | ICD-10-CM | POA: Diagnosis not present

## 2020-12-06 MED ORDER — ACCU-CHEK FASTCLIX LANCETS MISC: 3 refills | Status: AC

## 2020-12-06 MED ORDER — ACCU-CHEK GUIDE VI STRP: ORAL_STRIP | 3 refills | Status: DC

## 2020-12-06 NOTE — Telephone Encounter (Signed)
Rx sent 

## 2020-12-11 ENCOUNTER — Ambulatory Visit: Payer: Medicaid Other | Admitting: Pain Medicine

## 2021-01-02 ENCOUNTER — Other Ambulatory Visit: Payer: Self-pay | Admitting: Nurse Practitioner

## 2021-01-11 ENCOUNTER — Telehealth: Payer: Self-pay | Admitting: Nurse Practitioner

## 2021-01-11 NOTE — Telephone Encounter (Signed)
Pt came by and picked up Medical Certification for Application and Renewal of Disability Parking Placard. I made a copy and placed in scan box.

## 2021-01-17 ENCOUNTER — Encounter: Payer: Self-pay | Admitting: Nurse Practitioner

## 2021-01-17 ENCOUNTER — Other Ambulatory Visit: Payer: Self-pay

## 2021-01-17 ENCOUNTER — Ambulatory Visit (INDEPENDENT_AMBULATORY_CARE_PROVIDER_SITE_OTHER): Payer: Medicare HMO | Admitting: Nurse Practitioner

## 2021-01-17 VITALS — BP 106/70 | HR 76 | Temp 96.1°F | Ht 61.0 in | Wt 205.8 lb

## 2021-01-17 DIAGNOSIS — D509 Iron deficiency anemia, unspecified: Secondary | ICD-10-CM | POA: Diagnosis not present

## 2021-01-17 DIAGNOSIS — I10 Essential (primary) hypertension: Secondary | ICD-10-CM | POA: Diagnosis not present

## 2021-01-17 DIAGNOSIS — R350 Frequency of micturition: Secondary | ICD-10-CM

## 2021-01-17 DIAGNOSIS — E1159 Type 2 diabetes mellitus with other circulatory complications: Secondary | ICD-10-CM | POA: Diagnosis not present

## 2021-01-17 DIAGNOSIS — E559 Vitamin D deficiency, unspecified: Secondary | ICD-10-CM

## 2021-01-17 DIAGNOSIS — E782 Mixed hyperlipidemia: Secondary | ICD-10-CM | POA: Diagnosis not present

## 2021-01-17 DIAGNOSIS — F3170 Bipolar disorder, currently in remission, most recent episode unspecified: Secondary | ICD-10-CM

## 2021-01-17 DIAGNOSIS — E1140 Type 2 diabetes mellitus with diabetic neuropathy, unspecified: Secondary | ICD-10-CM | POA: Diagnosis not present

## 2021-01-17 DIAGNOSIS — E1149 Type 2 diabetes mellitus with other diabetic neurological complication: Secondary | ICD-10-CM

## 2021-01-17 DIAGNOSIS — G2581 Restless legs syndrome: Secondary | ICD-10-CM

## 2021-01-17 DIAGNOSIS — R69 Illness, unspecified: Secondary | ICD-10-CM | POA: Diagnosis not present

## 2021-01-17 DIAGNOSIS — F319 Bipolar disorder, unspecified: Secondary | ICD-10-CM | POA: Insufficient documentation

## 2021-01-17 DIAGNOSIS — Z794 Long term (current) use of insulin: Secondary | ICD-10-CM

## 2021-01-17 LAB — CBC WITH DIFFERENTIAL/PLATELET
Basophils Absolute: 0 10*3/uL (ref 0.0–0.1)
Basophils Relative: 0.2 % (ref 0.0–3.0)
Eosinophils Absolute: 0.2 10*3/uL (ref 0.0–0.7)
Eosinophils Relative: 2.5 % (ref 0.0–5.0)
HCT: 38.7 % (ref 36.0–46.0)
Hemoglobin: 12.1 g/dL (ref 12.0–15.0)
Lymphocytes Relative: 24.8 % (ref 12.0–46.0)
Lymphs Abs: 2.2 10*3/uL (ref 0.7–4.0)
MCHC: 31.4 g/dL (ref 30.0–36.0)
MCV: 87 fl (ref 78.0–100.0)
Monocytes Absolute: 0.8 10*3/uL (ref 0.1–1.0)
Monocytes Relative: 9.2 % (ref 3.0–12.0)
Neutro Abs: 5.7 10*3/uL (ref 1.4–7.7)
Neutrophils Relative %: 63.3 % (ref 43.0–77.0)
Platelets: 224 10*3/uL (ref 150.0–400.0)
RBC: 4.45 Mil/uL (ref 3.87–5.11)
RDW: 15.1 % (ref 11.5–15.5)
WBC: 8.9 10*3/uL (ref 4.0–10.5)

## 2021-01-17 LAB — HEPATIC FUNCTION PANEL
ALT: 9 U/L (ref 0–35)
AST: 10 U/L (ref 0–37)
Albumin: 4 g/dL (ref 3.5–5.2)
Alkaline Phosphatase: 63 U/L (ref 39–117)
Bilirubin, Direct: 0.1 mg/dL (ref 0.0–0.3)
Total Bilirubin: 0.4 mg/dL (ref 0.2–1.2)
Total Protein: 7.1 g/dL (ref 6.0–8.3)

## 2021-01-17 LAB — BASIC METABOLIC PANEL
BUN: 12 mg/dL (ref 6–23)
CO2: 29 mEq/L (ref 19–32)
Calcium: 9.5 mg/dL (ref 8.4–10.5)
Chloride: 104 mEq/L (ref 96–112)
Creatinine, Ser: 0.69 mg/dL (ref 0.40–1.20)
GFR: 95.7 mL/min (ref >60.00)
Glucose, Bld: 259 mg/dL — ABNORMAL HIGH (ref 70–99)
Potassium: 5.2 mEq/L — ABNORMAL HIGH (ref 3.5–5.1)
Sodium: 139 mEq/L (ref 135–145)

## 2021-01-17 LAB — VITAMIN D 25 HYDROXY (VIT D DEFICIENCY, FRACTURES): VITD: 31.23 ng/mL (ref 30.00–100.00)

## 2021-01-17 LAB — LIPID PANEL
Cholesterol: 153 mg/dL (ref 0–200)
HDL: 39 mg/dL — ABNORMAL LOW (ref >39.00)
LDL Cholesterol: 95 mg/dL (ref 0–99)
NonHDL: 113.93
Total CHOL/HDL Ratio: 4
Triglycerides: 95 mg/dL (ref 0.0–149.0)
VLDL: 19 mg/dL (ref 0.0–40.0)

## 2021-01-17 LAB — MICROALBUMIN / CREATININE URINE RATIO
Creatinine,U: 164.1 mg/dL
Microalb Creat Ratio: 0.8 mg/g (ref 0.0–30.0)
Microalb, Ur: 1.4 mg/dL (ref 0.0–1.9)

## 2021-01-17 MED ORDER — GABAPENTIN 300 MG PO CAPS: 300.0000 mg | ORAL_CAPSULE | Freq: Two times a day (BID) | ORAL | 3 refills | Status: DC

## 2021-01-17 MED ORDER — DULOXETINE HCL 60 MG PO CPEP: 60.0000 mg | ORAL_CAPSULE | Freq: Every day | ORAL | 3 refills | Status: DC

## 2021-01-17 NOTE — Progress Notes (Signed)
Subjective:  Patient ID: Autumn Valenzuela, female    DOB: 1960/02/09  Age: 61 y.o. MRN: [id number]  CC: Follow-up (3 month f/u on HTN and cholesterol. /Pt is fasting. )  HPI  HTN (hypertension) BP continues to stay at goal BP Readings from Last 3 Encounters:  01/17/21 106/70  12/02/20 127/75  11/28/20 122/78    DM (diabetes mellitus) ([place]) appt with endocrinology 01/19/2021 States she has not checked glucose in last 68months due to lack of CGM device and glucometer strips. Advised to contact endocrinologist office before upcoming appt. Current use of novolog SSI, lantus and jardiance Reports increased urinary frequency, worse at bedtime. Last HgbA1c of 7.9% 11/2020 Up to date with foot and eye exam Repeat urine microalbumin today With current use of jardiance, repeat urinalysis. Advised to maintain adequate oral hydration with water and avoid fluid intake within 2hrs of bedtime.  HLD (hyperlipidemia) Current use of crestor Repeat lipid panel today  Bipolar disorder in full remission ([place]) Stable mood with cymbalta Refill sent  Diabetic neuropathy with neurologic complication ([place]) Controlled with gabapentin   Reviewed past Medical, Social and Family history today.  Outpatient Medications Prior to Visit  Medication Sig Dispense Refill   Accu-Chek FastClix Lancets MISC TEST 4 TIMES DAILY. DX E11.9 306 each 3   albuterol (PROVENTIL) (2.5 MG/3ML) 0.083% nebulizer solution Take 2.5 mg by nebulization every 6 (six) hours as needed for wheezing.     albuterol (VENTOLIN HFA) 108 (90 Base) MCG/ACT inhaler Inhale 2 puffs into the lungs daily as needed for wheezing or shortness of breath. 18 g 2   Blood Glucose Calibration (ACCU-CHEK GUIDE CONTROL) LIQD 1 each by In Vitro route every 30 (thirty) days. Needs Accu-check guide monitor system. DX. E11.9 (Patient taking differently: 1 each by In Vitro route every 30 (thirty) days. Needs Accu-check guide monitor system. DX. E11.9 2 times  daily) 3 each 3   calcium-vitamin D (OSCAL WITH D) 500-200 MG-UNIT per tablet Take 1 tablet by mouth once a week.     Cholecalciferol (VITAMIN D3) 1.25 MG (50000 UT) CAPS Take 1 capsule by mouth once a week. 4 capsule 0   Continuous Blood Gluc Sensor (DEXCOM G6 SENSOR) MISC 1 Device by Does not apply route as directed. 9 each 3   Continuous Blood Gluc Transmit (DEXCOM G6 TRANSMITTER) MISC 1 Device by Does not apply route as directed. 1 each 3   diclofenac Sodium (VOLTAREN) 1 % GEL Apply 2 g topically 4 (four) times daily. (Patient taking differently: Apply 2 g topically 4 (four) times daily as needed (pain).) 100 g 1   empagliflozin (JARDIANCE) 10 MG TABS tablet Take 1 tablet (10 mg total) by mouth daily before breakfast. 30 tablet 6   ferrous sulfate 325 (65 FE) MG tablet Take 1 tablet (325 mg total) by mouth every other day. (Patient taking differently: Take 325 mg by mouth once a week.) 45 tablet 3   fluticasone (FLONASE) 50 MCG/ACT nasal spray Place 2 sprays into both nostrils daily as needed for allergies. 16 g 5   glucose blood (ACCU-CHEK GUIDE) test strip Use to test blood sugar 4 times a day.  Dx: E11.9 400 each 3   insulin aspart (NOVOLOG FLEXPEN) 100 UNIT/ML FlexPen Max daily 30 units (Patient taking differently: Inject 1-4 Units into the skin 3 (three) times daily as needed (blood sugar over 150). Max daily 30 units) 15 mL 6   insulin glargine (LANTUS SOLOSTAR) 100 UNIT/ML Solostar Pen Inject 26 Units into  the skin daily. 30 mL 3   Insulin Pen Needle 31G X 5 MM MISC 1 Units by Does not apply route in the morning, at noon, in the evening, and at bedtime. 400 each 1   meloxicam (MOBIC) 15 MG tablet Take 1 tablet (15 mg total) by mouth daily. With meals 90 tablet 0   metFORMIN (GLUCOPHAGE-XR) 500 MG 24 hr tablet TAKE 1 TABLET(500 MG) BY MOUTH TWICE DAILY WITH A MEAL 180 tablet 0   rosuvastatin (CRESTOR) 20 MG tablet Take 1 tablet (20 mg total) by mouth daily. 90 tablet 3   cyclobenzaprine  (FLEXERIL) 10 MG tablet Take 1 tablet (10 mg total) by mouth 3 (three) times daily as needed for muscle spasms. 30 tablet 0   DULoxetine (CYMBALTA) 60 MG capsule Take 1 capsule (60 mg total) by mouth at bedtime. 90 capsule 3   gabapentin (NEURONTIN) 300 MG capsule 1 capsule twice dailty (Patient taking differently: Take 300 mg by mouth 2 (two) times daily.) 180 capsule 3   Insulin Glargine (BASAGLAR KWIKPEN) 100 UNIT/ML Inject 26 Units into the skin at bedtime.     methocarbamol (ROBAXIN) 500 MG tablet Take 1 tablet (500 mg total) by mouth 2 (two) times daily. 20 tablet 0   naproxen (NAPROSYN) 500 MG tablet Take 1 tablet (500 mg total) by mouth 2 (two) times daily. 30 tablet 0   oxyCODONE 10 MG TABS Take 1 tablet (10 mg total) by mouth every 3 (three) hours as needed for severe pain ((score 7 to 10)). 30 tablet 0   No facility-administered medications prior to visit.    ROS See HPI  Objective:  BP 106/70 (BP Location: Left Arm, Patient Position: Sitting, Cuff Size: Large)    Pulse 76    Temp (!) 96.1 F (35.6 C) (Temporal)    Ht 5\' 1"  (1.549 m)    Wt 205 lb 12.8 oz (93.4 kg)    SpO2 98%    BMI 38.89 kg/m   Physical Exam Vitals reviewed.  Constitutional:      General: She is not in acute distress.    Appearance: She is well-developed.  Cardiovascular:     Rate and Rhythm: Normal rate and regular rhythm.     Pulses: Normal pulses.     Heart sounds: Normal heart sounds.  Pulmonary:     Effort: Pulmonary effort is normal.     Breath sounds: Normal breath sounds.  Musculoskeletal:     Right lower leg: No edema.     Left lower leg: No edema.  Neurological:     Mental Status: She is alert and oriented to person, place, and time.     Deep Tendon Reflexes: Reflexes are normal and symmetric.  Psychiatric:        Mood and Affect: Mood normal.        Behavior: Behavior normal.        Thought Content: Thought content normal.    Assessment & Plan:  This visit occurred during the  SARS-CoV-2 public health emergency.  Safety protocols were in place, including screening questions prior to the visit, additional usage of staff PPE, and extensive cleaning of exam room while observing appropriate contact time as indicated for disinfecting solutions.   Sanai was seen today for follow-up.  Diagnoses and all orders for this visit:  Primary hypertension -     CBC w/Diff -     Basic metabolic panel  Urinary frequency -     Urinalysis w microscopic + reflex  cultur  Mixed hyperlipidemia -     Lipid panel -     Hepatic function panel  Type 2 diabetes mellitus with other circulatory complication, with long-term current use of insulin (HCC) -     Microalbumin / creatinine urine ratio  Vitamin D deficiency -     Vitamin D (25 hydroxy)  Iron deficiency anemia, unspecified iron deficiency anemia type -     Iron, TIBC and Ferritin Panel  Bipolar disorder in full remission, most recent episode unspecified type (HCC) -     DULoxetine (CYMBALTA) 60 MG capsule; Take 1 capsule (60 mg total) by mouth at bedtime.  Diabetic neuropathy with neurologic complication (HCC) -     gabapentin (NEURONTIN) 300 MG capsule; Take 1 capsule (300 mg total) by mouth 2 (two) times daily.  RLS (restless legs syndrome) -     gabapentin (NEURONTIN) 300 MG capsule; Take 1 capsule (300 mg total) by mouth 2 (two) times daily.   Problem List Items Addressed This Visit       Cardiovascular and Mediastinum   HTN (hypertension) - Primary    BP continues to stay at goal BP Readings from Last 3 Encounters:  01/17/21 106/70  12/02/20 127/75  11/28/20 122/78        Relevant Orders   CBC w/Diff   Basic metabolic panel     Endocrine   Diabetic neuropathy with neurologic complication (HCC)    Controlled with gabapentin      Relevant Medications   gabapentin (NEURONTIN) 300 MG capsule   DM (diabetes mellitus) ([place])    appt with endocrinology 01/19/2021 States she has not checked glucose in  last 51months due to lack of CGM device and glucometer strips. Advised to contact endocrinologist office before upcoming appt. Current use of novolog SSI, lantus and jardiance Reports increased urinary frequency, worse at bedtime. Last HgbA1c of 7.9% 11/2020 Up to date with foot and eye exam Repeat urine microalbumin today With current use of jardiance, repeat urinalysis. Advised to maintain adequate oral hydration with water and avoid fluid intake within 2hrs of bedtime.      Relevant Orders   Microalbumin / creatinine urine ratio     Other   Bipolar disorder in full remission (HCC)    Stable mood with cymbalta Refill sent      Relevant Medications   DULoxetine (CYMBALTA) 60 MG capsule   HLD (hyperlipidemia)    Current use of crestor Repeat lipid panel today      Relevant Orders   Lipid panel   Hepatic function panel   RLS (restless legs syndrome)   Relevant Medications   gabapentin (NEURONTIN) 300 MG capsule   Other Visit Diagnoses     Urinary frequency       Relevant Orders   Urinalysis w microscopic + reflex cultur   Vitamin D deficiency       Relevant Orders   Vitamin D (25 hydroxy)   Iron deficiency anemia, unspecified iron deficiency anemia type       Relevant Orders   Iron, TIBC and Ferritin Panel       Follow-up: Return in about 6 months (around 07/17/2021) for DM and HTN, hyperlipidemia (fasting).  Wilfred Lacy, NP

## 2021-01-17 NOTE — Assessment & Plan Note (Signed)
BP continues to stay at goal BP Readings from Last 3 Encounters:  01/17/21 106/70  12/02/20 127/75  11/28/20 122/78

## 2021-01-17 NOTE — Patient Instructions (Signed)
Go to lab for urine collection and blood draw.  Maintain f/up appt with .  Schedule appt with retail pharmacy for TDAP and zoster vaccine.

## 2021-01-17 NOTE — Assessment & Plan Note (Signed)
Stable mood with cymbalta Refill sent

## 2021-01-17 NOTE — Assessment & Plan Note (Signed)
Current use of crestor Repeat lipid panel today

## 2021-01-17 NOTE — Assessment & Plan Note (Signed)
Controlled with gabapentin

## 2021-01-17 NOTE — Assessment & Plan Note (Addendum)
appt with endocrinology 01/19/2021 States she has not checked glucose in last 51months due to lack of CGM device and glucometer strips. Advised to contact endocrinologist office before upcoming appt. Current use of novolog SSI, lantus and jardiance Reports increased urinary frequency, worse at bedtime. Last HgbA1c of 7.9% 11/2020 Up to date with foot and eye exam Repeat urine microalbumin today With current use of jardiance, repeat urinalysis. Advised to maintain adequate oral hydration with water and avoid fluid intake within 2hrs of bedtime.

## 2021-01-19 ENCOUNTER — Telehealth: Payer: Self-pay | Admitting: Internal Medicine

## 2021-01-19 ENCOUNTER — Ambulatory Visit (INDEPENDENT_AMBULATORY_CARE_PROVIDER_SITE_OTHER): Payer: Medicare HMO | Admitting: Internal Medicine

## 2021-01-19 ENCOUNTER — Encounter: Payer: Self-pay | Admitting: Internal Medicine

## 2021-01-19 ENCOUNTER — Other Ambulatory Visit: Payer: Self-pay

## 2021-01-19 VITALS — BP 136/84 | HR 92 | Ht 61.0 in | Wt 206.8 lb

## 2021-01-19 DIAGNOSIS — E1165 Type 2 diabetes mellitus with hyperglycemia: Secondary | ICD-10-CM

## 2021-01-19 DIAGNOSIS — R0989 Other specified symptoms and signs involving the circulatory and respiratory systems: Secondary | ICD-10-CM | POA: Diagnosis not present

## 2021-01-19 DIAGNOSIS — E1142 Type 2 diabetes mellitus with diabetic polyneuropathy: Secondary | ICD-10-CM

## 2021-01-19 DIAGNOSIS — E1159 Type 2 diabetes mellitus with other circulatory complications: Secondary | ICD-10-CM | POA: Diagnosis not present

## 2021-01-19 DIAGNOSIS — Z794 Long term (current) use of insulin: Secondary | ICD-10-CM

## 2021-01-19 DIAGNOSIS — E119 Type 2 diabetes mellitus without complications: Secondary | ICD-10-CM | POA: Insufficient documentation

## 2021-01-19 LAB — IRON,TIBC AND FERRITIN PANEL
%SAT: 21 % (calc) (ref 16–45)
Ferritin: 92 ng/mL (ref 16–232)
Iron: 60 ug/dL (ref 45–160)
TIBC: 289 mcg/dL (calc) (ref 250–450)

## 2021-01-19 LAB — CULTURE INDICATED

## 2021-01-19 LAB — URINALYSIS W MICROSCOPIC + REFLEX CULTURE
Bilirubin Urine: NEGATIVE
Glucose, UA: NEGATIVE
Hyaline Cast: NONE SEEN /LPF
Ketones, ur: NEGATIVE
Nitrites, Initial: NEGATIVE
Protein, ur: NEGATIVE
RBC / HPF: NONE SEEN /HPF (ref 0–2)
Specific Gravity, Urine: 1.023 (ref 1.001–1.035)
WBC, UA: NONE SEEN /HPF (ref 0–5)
pH: 5.5 (ref 5.0–8.0)

## 2021-01-19 LAB — URINE CULTURE
MICRO NUMBER:: 12829952
SPECIMEN QUALITY:: ADEQUATE

## 2021-01-19 MED ORDER — ACCU-CHEK GUIDE VI STRP: 1.0000 | ORAL_STRIP | Freq: Four times a day (QID) | 3 refills | Status: DC

## 2021-01-19 MED ORDER — EMPAGLIFLOZIN 25 MG PO TABS: 25.0000 mg | ORAL_TABLET | Freq: Every day | ORAL | 3 refills | Status: DC

## 2021-01-19 MED ORDER — BLOOD GLUCOSE METER KIT: PACK | 0 refills | Status: AC

## 2021-01-19 MED ORDER — DEXCOM G6 SENSOR MISC: 1.0000 | 3 refills | Status: DC

## 2021-01-19 MED ORDER — DEXCOM G6 TRANSMITTER MISC: 1.0000 | 3 refills | Status: DC

## 2021-01-19 MED ORDER — METFORMIN HCL ER 500 MG PO TB24: 500.0000 mg | ORAL_TABLET | Freq: Two times a day (BID) | ORAL | 3 refills | Status: DC

## 2021-01-19 NOTE — Telephone Encounter (Signed)
Patient called re: Patient's insurance will not cover Accuchek Guide, however, Patient states her insurance will cover Accuchek 1 Meter, test strips and needles.  Patient requests new RX's for the  Accuchek 1 system and supplies (Meter, Test strips and needles) be sent to:  [facility] [phone] - [address], [place] [address] Phone:  [phone]  Fax:  [phone]

## 2021-01-19 NOTE — Progress Notes (Signed)
Name: Autumn Valenzuela  Age/ Sex: 61 y.o., female   MRN/ DOB: [id number], 05/29/1960     PCP: Flossie Buffy, NP   Reason for Endocrinology Evaluation: Type 2 Diabetes Mellitus  Initial Endocrine Consultative Visit: 03/08/2019    PATIENT IDENTIFIER: Autumn Valenzuela is a 61 y.o. female with a past medical history of .T2Dm, bipolar disorder,migrane headaches  and dyslipidemia  The patient has followed with Endocrinology clinic since 03/08/2019 for consultative assistance with management of her diabetes.  DIABETIC HISTORY:  Autumn Valenzuela was diagnosed with T2DM in 2018.  She has been tried on oral glycemic agents such as glipizide and Metformin, she is intolerant to higher doses of Metformin.  She has been on insulin since 2018.  Her hemoglobin A1c has ranged from  6.7% in 2018, peaking at 14.6% in 2021   On her initial visit to our clinic she had an A1c of 14.6%, she was on metformin and Lantus. We continued that and started Jardiance. By 09/2020 we provided her with novolog per correction scale   SUBJECTIVE:   During the last visit (09/15/2020): A1c of 7.5 %.  We continued metformin and Lantus and increased Jardiance     Today (01/19/2021): Autumn Valenzuela is here for follow-up on diabetes management. .She has not checked  glucose since 11/2020 due to running out of the strips and has not been able to get a refill.  In review of her refill history the patient has been prescribed test strips and 11/2020  She has not been able to get her Dexcom either   She is S/P back sx 11/2020-healing well She has a left hip  pain  She does have occasional leg cramps     HOME DIABETES REGIMEN:  Metformin 500 mg BID  Lantus 26 units daily Jardiance 25 mg daily  Correction scale : Novolog (BG- 130/20)     Statin: Yes ACE-I/ARB: No   METER DOWNLOAD SUMMARY: Unable to download   109-200 mg/dL    DIABETIC COMPLICATIONS: Microvascular complications:  Neuropathy Denies: CKD, retinopathy   Last eye exam: Completed 06/2020   Macrovascular complications:  Denies: CAD, PVD, CVA     HISTORY:  Past Medical History:  Past Medical History:  Diagnosis Date   Allergy    Anemia    Anxiety    Arthritis    Back pain    Bipolar disorder (HCC)    CVA (cerebral infarction)    Depression    Diabetes mellitus without complication (HCC)    High cholesterol    Hypertension    Left leg pain    Migraine    Sleep apnea    Stroke [facility])    Past Surgical History:  Past Surgical History:  Procedure Laterality Date   ABDOMINAL HYSTERECTOMY     ANTERIOR LATERAL LUMBAR FUSION WITH PERCUTANEOUS SCREW 1 LEVEL Left 02/11/2018   Procedure: LEFT LATERAL LUMBAR  FOUR-FIVE INTERBODY FUSION WITH INSTRUMENTATION AND ALLOGRAFT;  Surgeon: Phylliss Bob, MD;  Location: [facility];  Service: Orthopedics;  Laterality: Left;  LEFT LATERAL LUMBAR  FOUR-FIVE INTERBODY FUSION WITH INSTRUMENTATION AND ALLOGRAFT   BACK SURGERY     Lumbar fusion L5-S1   DIABETES     EYE SURGERY Bilateral    KNEE SURGERY     l 4-l5 status post lateral posterior fusion January 30,2020     NEUROFORAMINAL STENOSIS Left    Severe L4-L5, involving the level above previous fusion.   PARTIAL HYSTERECTOMY     RADICULOPATHY Left  L4, Secondary to an L4-L5 Spondylolisthesis   SHOULDER SURGERY     Social History:  reports that she quit smoking about 5 years ago. Her smoking use included cigarettes. She has never used smokeless tobacco. She reports current alcohol use. She reports current drug use. Drug: Marijuana. Family History:  Family History  Problem Relation Age of Onset   Diabetes Mother    Stroke Father    Cancer Paternal Aunt    Cancer Paternal Aunt    Diabetes Brother    Colon cancer Neg Hx    Esophageal cancer Neg Hx    Rectal cancer Neg Hx    Stomach cancer Neg Hx      HOME MEDICATIONS: Allergies as of 01/19/2021   No Known Allergies      Medication List        Accurate as of January 19, 2021 12:35 PM.  If you have any questions, ask your nurse or doctor.          Accu-Chek FastClix Lancets Misc TEST 4 TIMES DAILY. DX E11.9   Accu-Chek Guide Control Liqd 1 each by In Vitro route every 30 (thirty) days. Needs Accu-check guide monitor system. DX. E11.9 What changed: additional instructions   Accu-Chek Guide test strip Generic drug: glucose blood 1 each by Other route in the morning, at noon, in the evening, and at bedtime. What changed:  how much to take how to take this when to take this additional instructions Changed by: Dorita Sciara, MD   albuterol 108 (90 Base) MCG/ACT inhaler Commonly known as: VENTOLIN HFA Inhale 2 puffs into the lungs daily as needed for wheezing or shortness of breath.   albuterol (2.5 MG/3ML) 0.083% nebulizer solution Commonly known as: PROVENTIL Take 2.5 mg by nebulization every 6 (six) hours as needed for wheezing.   calcium-vitamin D 500-200 MG-UNIT tablet Commonly known as: OSCAL WITH D Take 1 tablet by mouth once a week.   Dexcom G6 Sensor Misc 1 Device by Does not apply route as directed.   Dexcom G6 Transmitter Misc 1 Device by Does not apply route as directed.   diclofenac Sodium 1 % Gel Commonly known as: Voltaren Apply 2 g topically 4 (four) times daily. What changed:  when to take this reasons to take this   DULoxetine 60 MG capsule Commonly known as: CYMBALTA Take 1 capsule (60 mg total) by mouth at bedtime.   empagliflozin 25 MG Tabs tablet Commonly known as: Jardiance Take 1 tablet (25 mg total) by mouth daily before breakfast. What changed:  medication strength how much to take Changed by: Dorita Sciara, MD   ferrous sulfate 325 (65 FE) MG tablet Take 1 tablet (325 mg total) by mouth every other day. What changed: when to take this   fluticasone 50 MCG/ACT nasal spray Commonly known as: FLONASE Place 2 sprays into both nostrils daily as needed for allergies.   gabapentin 300 MG  capsule Commonly known as: NEURONTIN Take 1 capsule (300 mg total) by mouth 2 (two) times daily.   Insulin Pen Needle 31G X 5 MM Misc 1 Units by Does not apply route in the morning, at noon, in the evening, and at bedtime.   Lantus SoloStar 100 UNIT/ML Solostar Pen Generic drug: insulin glargine Inject 26 Units into the skin daily.   meloxicam 15 MG tablet Commonly known as: Mobic Take 1 tablet (15 mg total) by mouth daily. With meals   metFORMIN 500 MG 24 hr tablet Commonly known as: GLUCOPHAGE-XR  Take 1 tablet (500 mg total) by mouth in the morning and at bedtime. What changed: See the new instructions. Changed by: Dorita Sciara, MD   NovoLOG FlexPen 100 UNIT/ML FlexPen Generic drug: insulin aspart Max daily 30 units What changed:  how much to take how to take this when to take this reasons to take this   rosuvastatin 20 MG tablet Commonly known as: Crestor Take 1 tablet (20 mg total) by mouth daily.   Vitamin D3 1.25 MG (50000 UT) Caps Take 1 capsule by mouth once a week.         OBJECTIVE:   Vital Signs: BP 136/84 (BP Location: Left Arm, Patient Position: Sitting, Cuff Size: Small)    Pulse 92    Ht 5\' 1"  (1.549 m)    Wt 206 lb 12.8 oz (93.8 kg)    SpO2 99%    BMI 39.07 kg/m   Wt Readings from Last 3 Encounters:  01/19/21 206 lb 12.8 oz (93.8 kg)  01/17/21 205 lb 12.8 oz (93.4 kg)  12/01/20 208 lb (94.3 kg)     Exam: General: Pt appears well and is in NAD  Lungs: Clear with good BS bilat with no rales, rhonchi, or wheezes  Heart: RRR with normal S1 and S2 and no gallops; no murmurs; no rub  Extremities: No pretibial edema.   Neuro: MS is good with appropriate affect, pt is alert and Ox3   DM Foot Exam 01/19/2021    The skin of the feet is intact without sores or ulcerations. The pedal pulses are undetectable  The sensation is absent to a screening 5.07, 10 gram monofilament bilaterally    DATA REVIEWED:  Lab Results  Component Value  Date   HGBA1C 7.9 (H) 11/28/2020   HGBA1C 7.5 (A) 09/15/2020   HGBA1C 8.6 (H) 06/21/2020   Lab Results  Component Value Date   MICROALBUR 1.4 01/17/2021   LDLCALC 95 01/17/2021   CREATININE 0.69 01/17/2021   Lab Results  Component Value Date   MICRALBCREAT 0.8 01/17/2021    Latest Reference Range & Units 01/17/21 08:36  Sodium 135 - 145 mEq/L 139  Potassium 3.5 - 5.1 mEq/L 5.2 No hemolysis seen (H)  Chloride 96 - 112 mEq/L 104  CO2 19 - 32 mEq/L 29  Glucose 70 - 99 mg/dL 259 (H)  BUN 6 - 23 mg/dL 12  Creatinine 0.40 - 1.20 mg/dL 0.69  Calcium 8.4 - 10.5 mg/dL 9.5  Alkaline Phosphatase 39 - 117 U/L 63  Albumin 3.5 - 5.2 g/dL 4.0  AST 0 - 37 U/L 10  ALT 0 - 35 U/L 9  Total Protein 6.0 - 8.3 g/dL 7.1  Bilirubin, Direct 0.0 - 0.3 mg/dL 0.1  Total Bilirubin 0.2 - 1.2 mg/dL 0.4  GFR >60.00 mL/min 95.70     ASSESSMENT / PLAN / RECOMMENDATIONS:   1) Type 2 Diabetes Mellitus, with improving glycemic control , With neuropathic complications - Most recent A1c of 7.9  %. Goal A1c <7.0%.    -A1c increased from 7.5% to 7.9%, this is due to lack of glucose checks hands lack of NovoLog intake per correction scale -I have refilled her test strips as well as her Dexcom -Patient advised to follow-up with pharmacy on these -No changes at this time, encouraged the patient to continue glucose checks before meals and to take NovoLog accordingly   MEDICATIONS: Continue Metformin 500 mg 1 tablet with breakfast Continue  lantus  26 units daily  Continue Jardiance 25 mg  daily Correction scale : Novolog (BG- 130/20)   EDUCATION / INSTRUCTIONS: BG monitoring instructions: Patient is instructed to check her blood sugars 2 times a day, fasting and bedtime as much as possible Call [facility] if: BG persistently  <70 I reviewed the Rule of 15 for the treatment of hypoglycemia in detail with the patient. Literature supplied.    2) Diabetic complications:  Eye: Does not have  known diabetic retinopathy.  Neuro/ Feet: Does have known diabetic peripheral neuropathy. Renal: Patient does not have known baseline CKD. She is not an ACEI/ARB at present    3) Decreased dorsalis pedis pulses:  -I was unable to palpate dorsalis pedis pulse today -Patient endorses occasional leg cramps -We will proceed with ABI of lower extremities  F/U in 4 months    Signed electronically by: Mack Guise, MD  [facility] Endocrinology  [facility] [address] [place], [place] [address] Phone: [phone] FAX: [phone]   CC: Flossie Buffy, NP [place] [address] Phone: [phone]  Fax: [phone]  Return to Endocrinology clinic as below: Future Appointments  Date Time Provider [place]  02/28/2021  9:00 AM Milinda Pointer, MD [facility] None  05/18/2021  8:10 AM Elizardo Chilson, Melanie Crazier, MD [facility] None

## 2021-01-19 NOTE — Patient Instructions (Addendum)
°-   Continue Metformin 500 mg 1 tablet Twice a day  - Continue  lantus 26 units daily  - Continue Jardiance to 25 mg , 1 tablet daily  Novolog correctional insulin: Use the scale below to help guide you:   Blood sugar before meal Number of units to inject  Less than 150 0 unit  151 -  170 1 units  171 -  190 2 units  191 -  210 3 units  211 -  230 4 units  231 -  250 5 units  251 -  270 6 units  271 -  290 7 units  291 -  310 8 units  311 - 330 9 units   331 -  350 10 units         HOW TO TREAT LOW BLOOD SUGARS (Blood sugar LESS THAN 70 MG/DL) Please follow the RULE OF 15 for the treatment of hypoglycemia treatment (when your (blood sugars are less than 70 mg/dL)   STEP 1: Take 15 grams of carbohydrates when your blood sugar is low, which includes:  3-4 GLUCOSE TABS  OR 3-4 OZ OF JUICE OR REGULAR SODA OR ONE TUBE OF GLUCOSE GEL    STEP 2: RECHECK blood sugar in 15 MINUTES STEP 3: If your blood sugar is still low at the 15 minute recheck --> then, go back to STEP 1 and treat AGAIN with another 15 grams of carbohydrates.

## 2021-01-19 NOTE — Telephone Encounter (Signed)
Generic script sent in for the to fill which ever meter insurance will cover.

## 2021-01-22 ENCOUNTER — Ambulatory Visit (HOSPITAL_COMMUNITY)
Admission: RE | Admit: 2021-01-22 | Discharge: 2021-01-22 | Disposition: A | Discharge status: Home / Self Care | Payer: Medicare HMO | Source: Ambulatory Visit | Attending: Internal Medicine | Admitting: Internal Medicine

## 2021-01-22 ENCOUNTER — Other Ambulatory Visit: Payer: Self-pay

## 2021-01-22 DIAGNOSIS — R0989 Other specified symptoms and signs involving the circulatory and respiratory systems: Secondary | ICD-10-CM | POA: Insufficient documentation

## 2021-01-23 DIAGNOSIS — S32009K Unspecified fracture of unspecified lumbar vertebra, subsequent encounter for fracture with nonunion: Secondary | ICD-10-CM | POA: Diagnosis not present

## 2021-01-23 DIAGNOSIS — M25552 Pain in left hip: Secondary | ICD-10-CM | POA: Diagnosis not present

## 2021-01-24 ENCOUNTER — Telehealth: Payer: Self-pay | Admitting: Internal Medicine

## 2021-01-24 DIAGNOSIS — R0989 Other specified symptoms and signs involving the circulatory and respiratory systems: Secondary | ICD-10-CM

## 2021-01-24 NOTE — Telephone Encounter (Signed)
Please let the pt know that her leg ultrasound showed she has slight circulation issue on the left side.  I have put in a referral to vascular department and they should contact her within the next few weeks.     Thanks

## 2021-01-25 NOTE — Telephone Encounter (Signed)
Patient notified and will wait to here from vascular department.

## 2021-01-26 DIAGNOSIS — G5602 Carpal tunnel syndrome, left upper limb: Secondary | ICD-10-CM | POA: Diagnosis not present

## 2021-01-26 DIAGNOSIS — R29898 Other symptoms and signs involving the musculoskeletal system: Secondary | ICD-10-CM | POA: Diagnosis not present

## 2021-01-26 DIAGNOSIS — M25632 Stiffness of left wrist, not elsewhere classified: Secondary | ICD-10-CM | POA: Diagnosis not present

## 2021-01-26 DIAGNOSIS — G5622 Lesion of ulnar nerve, left upper limb: Secondary | ICD-10-CM | POA: Diagnosis not present

## 2021-01-26 DIAGNOSIS — T7840XS Allergy, unspecified, sequela: Secondary | ICD-10-CM | POA: Diagnosis not present

## 2021-02-01 ENCOUNTER — Other Ambulatory Visit: Payer: Self-pay | Admitting: Internal Medicine

## 2021-02-06 ENCOUNTER — Other Ambulatory Visit: Payer: Self-pay

## 2021-02-06 ENCOUNTER — Ambulatory Visit (INDEPENDENT_AMBULATORY_CARE_PROVIDER_SITE_OTHER): Payer: Medicare HMO | Admitting: Vascular Surgery

## 2021-02-06 ENCOUNTER — Encounter: Payer: Self-pay | Admitting: Vascular Surgery

## 2021-02-06 DIAGNOSIS — I739 Peripheral vascular disease, unspecified: Secondary | ICD-10-CM | POA: Diagnosis not present

## 2021-02-06 NOTE — Progress Notes (Signed)
Patient name: Autumn Valenzuela MRN: [id number] DOB: [birth year] Sex: female  REASON FOR CONSULT: Evaluate PAD and decreased pulse in the left leg  HPI: Autumn Valenzuela is a 61 y.o. female, with history of hypertension, hyperlipidemia, diabetes that presents for evaluation of left leg pain with decreased pulse.  Patient describes pain in the left calf when walking that has been worsening over the last several years.  She used to be able to go to the mall and at least walk around part of the third floor.  Now she is unable to get to her car and has since stopped going to the grocery store and to [employer].  She denies any tobacco abuse.  She is on statin.  Previously was seen by Dr. Donnetta Hutching in 2020 and at that time had normal ABIs with palpable pulses and he did not feel she had significant arterial insufficiency.  Past Medical History:  Diagnosis Date   Allergy    Anemia    Anxiety    Arthritis    Back pain    Bipolar disorder (HCC)    CVA (cerebral infarction)    Depression    Diabetes mellitus without complication (HCC)    High cholesterol    Hypertension    Left leg pain    Migraine    Sleep apnea    Stroke [facility])     Past Surgical History:  Procedure Laterality Date   ABDOMINAL HYSTERECTOMY     ANTERIOR LATERAL LUMBAR FUSION WITH PERCUTANEOUS SCREW 1 LEVEL Left 02/11/2018   Procedure: LEFT LATERAL LUMBAR  FOUR-FIVE INTERBODY FUSION WITH INSTRUMENTATION AND ALLOGRAFT;  Surgeon: Phylliss Bob, MD;  Location: [place];  Service: Orthopedics;  Laterality: Left;  LEFT LATERAL LUMBAR  FOUR-FIVE INTERBODY FUSION WITH INSTRUMENTATION AND ALLOGRAFT   BACK SURGERY     Lumbar fusion L5-S1   DIABETES     EYE SURGERY Bilateral    KNEE SURGERY     l 4-l5 status post lateral posterior fusion January 30,2020     NEUROFORAMINAL STENOSIS Left    Severe L4-L5, involving the level above previous fusion.   PARTIAL HYSTERECTOMY     RADICULOPATHY Left    L4, Secondary to an L4-L5 Spondylolisthesis    SHOULDER SURGERY      Family History  Problem Relation Age of Onset   Diabetes Mother    Stroke Father    Cancer Paternal Aunt    Cancer Paternal Aunt    Diabetes Brother    Colon cancer Neg Hx    Esophageal cancer Neg Hx    Rectal cancer Neg Hx    Stomach cancer Neg Hx     SOCIAL HISTORY: Social History   Socioeconomic History   Marital status: Single    Spouse name: Not on file   Number of children: 3   Years of education: 12   Highest education level: Not on file  Occupational History   Not on file  Tobacco Use   Smoking status: Former    Types: Cigarettes    Quit date: 12/13/2015    Years since quitting: 5.1   Smokeless tobacco: Never  Vaping Use   Vaping Use: Never used  Substance and Sexual Activity   Alcohol use: Yes    Comment: Occas   Drug use: Yes    Types: Marijuana   Sexual activity: Not Currently    Comment: 1st intercourse 61 yo-Fewer than 5 partners  Other Topics Concern   Not on file  Social History Narrative   Patient is single with 3 children.   Patient is right handed.   Patient has a high school education.   Patient drinks at least 3 cups of caffeine daily.   Social Determinants of Health   Financial Resource Strain: Medium Risk   Difficulty of Paying Living Expenses: Somewhat hard  Food Insecurity: No Food Insecurity   Worried About Programme researcher, broadcasting/film/video in the Last Year: Never true   Ran Out of Food in the Last Year: Never true  Transportation Needs: No Transportation Needs   Lack of Transportation (Medical): No   Lack of Transportation (Non-Medical): No  Physical Activity: Inactive   Days of Exercise per Week: 0 days   Minutes of Exercise per Session: 0 min  Stress: Stress Concern Present   Feeling of Stress : To some extent  Social Connections: Socially Isolated   Frequency of Communication with Friends and Family: Three times a week   Frequency of Social Gatherings with Friends and Family: More than three times a week    Attends Religious Services: Never   Database administrator or Organizations: No   Attends Engineer, structural: Never   Marital Status: Separated  Intimate Partner Violence: Not At Risk   Fear of Current or Ex-Partner: No   Emotionally Abused: No   Physically Abused: No   Sexually Abused: No    No Known Allergies  Current Outpatient Medications  Medication Sig Dispense Refill   Accu-Chek FastClix Lancets MISC TEST 4 TIMES DAILY. DX E11.9 306 each 3   albuterol (PROVENTIL) (2.5 MG/3ML) 0.083% nebulizer solution Take 2.5 mg by nebulization every 6 (six) hours as needed for wheezing.     albuterol (VENTOLIN HFA) 108 (90 Base) MCG/ACT inhaler Inhale 2 puffs into the lungs daily as needed for wheezing or shortness of breath. 18 g 2   Blood Glucose Calibration (ACCU-CHEK GUIDE CONTROL) LIQD 1 each by In Vitro route every 30 (thirty) days. Needs Accu-check guide monitor system. DX. E11.9 (Patient taking differently: 1 each by In Vitro route every 30 (thirty) days. Needs Accu-check guide monitor system. DX. E11.9 2 times daily) 3 each 3   blood glucose meter kit and supplies Dispense based on patient and insurance preference. Use up to four times daily as directed. (FOR ICD-10 E10.9, E11.9). 1 each 0   calcium-vitamin D (OSCAL WITH D) 500-200 MG-UNIT per tablet Take 1 tablet by mouth once a week.     Cholecalciferol (VITAMIN D3) 1.25 MG (50000 UT) CAPS Take 1 capsule by mouth once a week. 4 capsule 0   Continuous Blood Gluc Sensor (DEXCOM G6 SENSOR) MISC 1 Device by Does not apply route as directed. 9 each 3   Continuous Blood Gluc Transmit (DEXCOM G6 TRANSMITTER) MISC 1 Device by Does not apply route as directed. 1 each 3   diclofenac Sodium (VOLTAREN) 1 % GEL Apply 2 g topically 4 (four) times daily. (Patient taking differently: Apply 2 g topically 4 (four) times daily as needed (pain).) 100 g 1   DULoxetine (CYMBALTA) 60 MG capsule Take 1 capsule (60 mg total) by mouth at bedtime. 90  capsule 3   empagliflozin (JARDIANCE) 25 MG TABS tablet Take 1 tablet (25 mg total) by mouth daily before breakfast. 90 tablet 3   ferrous sulfate 325 (65 FE) MG tablet Take 1 tablet (325 mg total) by mouth every other day. (Patient taking differently: Take 325 mg by mouth once a week.) 45 tablet 3  fluticasone (FLONASE) 50 MCG/ACT nasal spray Place 2 sprays into both nostrils daily as needed for allergies. 16 g 5   gabapentin (NEURONTIN) 300 MG capsule Take 1 capsule (300 mg total) by mouth 2 (two) times daily. 180 capsule 3   insulin aspart (NOVOLOG FLEXPEN) 100 UNIT/ML FlexPen Max daily 30 units (Patient taking differently: Inject 1-4 Units into the skin 3 (three) times daily as needed (blood sugar over 150). Max daily 30 units) 15 mL 6   insulin glargine (LANTUS SOLOSTAR) 100 UNIT/ML Solostar Pen Inject 26 Units into the skin daily. 30 mL 3   Insulin Pen Needle 31G X 5 MM MISC 1 Units by Does not apply route in the morning, at noon, in the evening, and at bedtime. 400 each 1   meloxicam (MOBIC) 15 MG tablet Take 1 tablet (15 mg total) by mouth daily. With meals 90 tablet 0   metFORMIN (GLUCOPHAGE-XR) 500 MG 24 hr tablet Take 1 tablet (500 mg total) by mouth in the morning and at bedtime. 180 tablet 3   ONETOUCH VERIO test strip CHECK UP TO 4 TIMES A DAY AS DIRECTED 100 strip 2   rosuvastatin (CRESTOR) 20 MG tablet Take 1 tablet (20 mg total) by mouth daily. 90 tablet 3   No current facility-administered medications for this visit.    REVIEW OF SYSTEMS:  $RemoveB'[X]'joxsmxSM$  denotes positive finding, $RemoveBeforeDEI'[ ]'[id number]$  denotes negative finding Cardiac  Comments:  Chest pain or chest pressure:    Shortness of breath upon exertion:    Short of breath when lying flat:    Irregular heart rhythm:        Vascular    Pain in calf, thigh, or hip brought on by ambulation: x left  Pain in feet at night that wakes you up from your sleep:     Blood clot in your veins:    Leg swelling:         Pulmonary    Oxygen at home:     Productive cough:     Wheezing:         Neurologic    Sudden weakness in arms or legs:     Sudden numbness in arms or legs:     Sudden onset of difficulty speaking or slurred speech:    Temporary loss of vision in one eye:     Problems with dizziness:         Gastrointestinal    Blood in stool:     Vomited blood:         Genitourinary    Burning when urinating:     Blood in urine:        Psychiatric    Major depression:         Hematologic    Bleeding problems:    Problems with blood clotting too easily:        Skin    Rashes or ulcers:        Constitutional    Fever or chills:      PHYSICAL EXAM: Vitals:   02/06/21 0932  BP: 120/86  Pulse: 79  Resp: 16  Temp: 97.8 F (36.6 C)  TempSrc: Temporal  SpO2: 97%  Weight: 205 lb (93 kg)  Height: $Remove'5\' 1"'QbtPRho$  (1.549 m)    GENERAL: The patient is a well-nourished female, in no acute distress. The vital signs are documented above. CARDIAC: There is a regular rate and rhythm.  VASCULAR:  Palpable femoral pulses bilaterally Right DP palpable No palpable left pedal  pulses No tissue loss PULMONARY: No respiratory distress ABDOMEN: Soft and non-tender. MUSCULOSKELETAL: There are no major deformities or cyanosis. NEUROLOGIC: No focal weakness or paresthesias are detected. SKIN: There are no ulcers or rashes noted. PSYCHIATRIC: The patient has a normal affect.  DATA:   ABIs 01/22/2021 1.07 on the right biphasic and 0.85 on the left monophasic with a toe pressure of 0 (previous ABI >1 triphasic in 2020)  Assessment/Plan:  61 year old female presents for evaluation of peripheral arterial disease.  Her symptoms are consistent with lifestyle limiting claudication in the left lower extremity.  I discussed claudication is not typically limb threatening situation and we manage this conservatively with exercise and walking therapies as well as statin and aspirin therapy.  She has made an attempt to walk and used to go to the mall on  a daily basis.  She's had significant progression of symptoms and now is really unable to walk to the car or go to the grocery store with left calf cramping.  We discussed alternative would be lower extremity arteriogram with possible intervention if she has endovascular options.  She wishes to proceed with intervention as she feels that she is really unable to do her activities of daily living and care for her grandchildren. Discussed may need open surgical bypass.  We will get her scheduled in the Cath Lab this week.  Risk benefits discussed.   Marty Heck, MD [facility] Office: [phone]

## 2021-02-07 DIAGNOSIS — G5611 Other lesions of median nerve, right upper limb: Secondary | ICD-10-CM | POA: Diagnosis not present

## 2021-02-08 ENCOUNTER — Ambulatory Visit (HOSPITAL_COMMUNITY)
Admission: RE | Admit: 2021-02-08 | Discharge: 2021-02-08 | Disposition: A | Discharge status: Home / Self Care | Payer: Medicare HMO | Source: Ambulatory Visit | Attending: Vascular Surgery | Admitting: Vascular Surgery

## 2021-02-08 ENCOUNTER — Encounter (HOSPITAL_COMMUNITY)
Admission: RE | Disposition: A | Discharge status: Home / Self Care | Payer: Self-pay | Source: Ambulatory Visit | Attending: Vascular Surgery

## 2021-02-08 ENCOUNTER — Other Ambulatory Visit: Payer: Self-pay

## 2021-02-08 DIAGNOSIS — E1151 Type 2 diabetes mellitus with diabetic peripheral angiopathy without gangrene: Secondary | ICD-10-CM | POA: Insufficient documentation

## 2021-02-08 DIAGNOSIS — I70212 Atherosclerosis of native arteries of extremities with intermittent claudication, left leg: Secondary | ICD-10-CM | POA: Insufficient documentation

## 2021-02-08 DIAGNOSIS — I10 Essential (primary) hypertension: Secondary | ICD-10-CM | POA: Insufficient documentation

## 2021-02-08 DIAGNOSIS — E785 Hyperlipidemia, unspecified: Secondary | ICD-10-CM | POA: Insufficient documentation

## 2021-02-08 DIAGNOSIS — Z20822 Contact with and (suspected) exposure to covid-19: Secondary | ICD-10-CM | POA: Insufficient documentation

## 2021-02-08 DIAGNOSIS — Z87891 Personal history of nicotine dependence: Secondary | ICD-10-CM | POA: Diagnosis not present

## 2021-02-08 HISTORY — PX: ABDOMINAL AORTOGRAM W/LOWER EXTREMITY: CATH118223

## 2021-02-08 LAB — GLUCOSE, CAPILLARY: Glucose-Capillary: 135 mg/dL — ABNORMAL HIGH (ref 70–99)

## 2021-02-08 LAB — POCT I-STAT, CHEM 8
BUN: 11 mg/dL (ref 6–20)
Calcium, Ion: 1.19 mmol/L (ref 1.15–1.40)
Chloride: 104 mmol/L (ref 98–111)
Creatinine, Ser: 0.5 mg/dL (ref 0.44–1.00)
Glucose, Bld: 191 mg/dL — ABNORMAL HIGH (ref 70–99)
HCT: 41 % (ref 36.0–46.0)
Hemoglobin: 13.9 g/dL (ref 12.0–15.0)
Potassium: 4.3 mmol/L (ref 3.5–5.1)
Sodium: 140 mmol/L (ref 135–145)
TCO2: 25 mmol/L (ref 22–32)

## 2021-02-08 SURGERY — ABDOMINAL AORTOGRAM W/LOWER EXTREMITY
Anesthesia: LOCAL

## 2021-02-08 MED ORDER — FENTANYL CITRATE (PF) 100 MCG/2ML IJ SOLN
INTRAMUSCULAR | Status: DC | PRN
  Administered 2021-02-08: 25 ug via INTRAVENOUS

## 2021-02-08 MED ORDER — ONDANSETRON HCL 4 MG/2ML IJ SOLN: 4.0000 mg | Freq: Four times a day (QID) | INTRAMUSCULAR | Status: DC | PRN

## 2021-02-08 MED ORDER — HEPARIN (PORCINE) IN NACL 1000-0.9 UT/500ML-% IV SOLN
INTRAVENOUS | Status: AC
  Filled 2021-02-08: qty 500

## 2021-02-08 MED ORDER — LIDOCAINE HCL (PF) 1 % IJ SOLN
INTRAMUSCULAR | Status: AC
  Filled 2021-02-08: qty 30

## 2021-02-08 MED ORDER — FENTANYL CITRATE (PF) 100 MCG/2ML IJ SOLN
INTRAMUSCULAR | Status: AC
  Filled 2021-02-08: qty 2

## 2021-02-08 MED ORDER — HEPARIN (PORCINE) IN NACL 1000-0.9 UT/500ML-% IV SOLN
INTRAVENOUS | Status: DC | PRN
  Administered 2021-02-08 (×2): 500 mL

## 2021-02-08 MED ORDER — LABETALOL HCL 5 MG/ML IV SOLN: 10.0000 mg | INTRAVENOUS | Status: DC | PRN

## 2021-02-08 MED ORDER — ASPIRIN EC 81 MG PO TBEC: 81.0000 mg | DELAYED_RELEASE_TABLET | Freq: Every day | ORAL | Status: DC

## 2021-02-08 MED ORDER — SODIUM CHLORIDE 0.9 % IV SOLN: INTRAVENOUS | Status: DC

## 2021-02-08 MED ORDER — SODIUM CHLORIDE 0.9% FLUSH: 3.0000 mL | INTRAVENOUS | Status: DC | PRN

## 2021-02-08 MED ORDER — SODIUM CHLORIDE 0.9% FLUSH: 3.0000 mL | Freq: Two times a day (BID) | INTRAVENOUS | Status: DC

## 2021-02-08 MED ORDER — MIDAZOLAM HCL 2 MG/2ML IJ SOLN
INTRAMUSCULAR | Status: DC | PRN
  Administered 2021-02-08: 2 mg via INTRAVENOUS

## 2021-02-08 MED ORDER — SODIUM CHLORIDE 0.9 % IV SOLN: 250.0000 mL | INTRAVENOUS | Status: DC | PRN

## 2021-02-08 MED ORDER — IODIXANOL 320 MG/ML IV SOLN
INTRAVENOUS | Status: DC | PRN
  Administered 2021-02-08: 140 mL via INTRA_ARTERIAL

## 2021-02-08 MED ORDER — ACETAMINOPHEN 325 MG PO TABS: 650.0000 mg | ORAL_TABLET | ORAL | Status: DC | PRN

## 2021-02-08 MED ORDER — MIDAZOLAM HCL 2 MG/2ML IJ SOLN
INTRAMUSCULAR | Status: AC
  Filled 2021-02-08: qty 2

## 2021-02-08 MED ORDER — LIDOCAINE HCL (PF) 1 % IJ SOLN
INTRAMUSCULAR | Status: DC | PRN
  Administered 2021-02-08: 18 mL via INTRADERMAL

## 2021-02-08 MED ORDER — HYDRALAZINE HCL 20 MG/ML IJ SOLN: 5.0000 mg | INTRAMUSCULAR | Status: DC | PRN

## 2021-02-08 SURGICAL SUPPLY — 12 items
CATH OMNI FLUSH 5F 65CM (CATHETERS) ×1 IMPLANT
CATH SOFT-VU 4F 65 STRAIGHT (CATHETERS) IMPLANT
CATH SOFT-VU STRAIGHT 4F 65CM (CATHETERS) ×2
DEVICE CLOSURE MYNXGRIP 5F (Vascular Products) ×1 IMPLANT
GUIDEWIRE ANGLED .035X150CM (WIRE) ×1 IMPLANT
KIT MICROPUNCTURE NIT STIFF (SHEATH) ×1 IMPLANT
KIT PV (KITS) ×2 IMPLANT
SHEATH PINNACLE 5F 10CM (SHEATH) ×1 IMPLANT
SHEATH PROBE COVER 6X72 (BAG) ×1 IMPLANT
TRANSDUCER W/STOPCOCK (MISCELLANEOUS) ×2 IMPLANT
TRAY PV CATH (CUSTOM PROCEDURE TRAY) ×2 IMPLANT
WIRE BENTSON .035X145CM (WIRE) ×1 IMPLANT

## 2021-02-08 NOTE — Op Note (Signed)
° ° °  Patient name: Autumn Valenzuela MRN: [id number] DOB: November 18, 1960 Sex: female  02/08/2021 Pre-operative Diagnosis: Lifestyle limiting claudication left lower extremity Post-operative diagnosis:  Same Surgeon:  Marty Heck, MD Procedure Performed: 1.  Ultrasound-guided access right common femoral artery 2.  Aortogram with catheter selection of aorta 3.  Bilateral lower extremity arteriogram including selection of second-order branches in the left lower extremity 4.  Mynx closure of the right common femoral artery 5.  43 minutes of monitored moderate conscious sedation time  Indications: Patient is a 61 year old female seen with lifestyle limiting claudication in the left lower extremity.  Her ABI was 0.85 on the left but with a blunted monophasic waveforms.  She presents today for aortogram, lower extremity arteriogram, and possible intervention with initial focus on the left leg.  Risks benefits discussed.  Findings:   Aortogram showed patent renal arteries bilaterally and a widely patent aortoiliac segment with no flow-limiting stenosis.  On the left, she has a high-grade greater than 80% focal left common femoral stenosis with a widely patent profunda and widely patent SFA, above and below-knee popliteal artery, and two-vessel runoff in the anterior tibial and posterior tibial artery.  On the right, she has a patent common femoral, profunda, SFA, popliteal and only runoff in the anterior tibial that appears sluggish.   Procedure:  The patient was identified in the holding area and taken to room 8.  The patient was then placed supine on the table and prepped and draped in the usual sterile fashion.  A time out was called.  Ultrasound was used to evaluate the right common femoral artery.  It was patent .  A digital ultrasound image was acquired.  A micropuncture needle was used to access the right common femoral artery under ultrasound guidance.  An 018 wire was advanced without  resistance and a micropuncture sheath was placed.  The 018 wire was removed and a benson wire was placed.  The micropuncture sheath was exchanged for a 5 french sheath.  An omniflush catheter was advanced over the wire to the level of L-1.  An abdominal angiogram was obtained.  We then pulled the catheter down and shot bilateral lower extremity runoff.  The catheter was pulled down below the bifurcation and we did not get good images of the left side.  I then elected to cross the bifurcation with a Omni Flush catheter and Bentson wire but, due to steep bifurcation I did have to use a Glidewire with a 4 French straight catheter to cross the bifurcation.  We then got staged imaging of the left lower extremity including the popliteal artery where she had knee hardware.  After evaluating images I thought her best option was a left common femoral endarterectomy.  Wires and catheters were removed.  A mynx closure device was placed in the right common femoral artery.  Taken to holding in stable condition.  Plan: Patient be scheduled for left common femoral endarterectomy on Monday with me.   Marty Heck, MD [facility] [place] Office: [phone]

## 2021-02-08 NOTE — Progress Notes (Signed)
Pt ambulated without difficulty or bleeding.   Discharged home with her mother who will drive and stay with pt x 24 hrs.

## 2021-02-08 NOTE — H&P (Signed)
History and Physical Interval Note:  02/08/2021 1:38 PM  Autumn Valenzuela  has presented today for surgery, with the diagnosis of pad.  The various methods of treatment have been discussed with the patient and family. After consideration of risks, benefits and other options for treatment, the patient has consented to  Procedure(s): ABDOMINAL AORTOGRAM W/LOWER EXTREMITY (N/A) as a surgical intervention.  The patient's history has been reviewed, patient examined, no change in status, stable for surgery.  I have reviewed the patient's chart and labs.  Questions were answered to the patient's satisfaction.     Marty Heck  Patient name: Autumn Valenzuela         MRN: [id number]        DOB: 03-Sep-1960          Sex: female   REASON FOR CONSULT: Evaluate PAD and decreased pulse in the left leg   HPI: Autumn Valenzuela is a 61 y.o. female, with history of hypertension, hyperlipidemia, diabetes that presents for evaluation of left leg pain with decreased pulse.  Patient describes pain in the left calf when walking that has been worsening over the last several years.  She used to be able to go to the mall and at least walk around part of the third floor.  Now she is unable to get to her car and has since stopped going to the grocery store and to [employer].  She denies any tobacco abuse.  She is on statin.  Previously was seen by Dr. Donnetta Hutching in 2020 and at that time had normal ABIs with palpable pulses and he did not feel she had significant arterial insufficiency.       Past Medical History:  Diagnosis Date   Allergy     Anemia     Anxiety     Arthritis     Back pain     Bipolar disorder (HCC)     CVA (cerebral infarction)     Depression     Diabetes mellitus without complication (HCC)     High cholesterol     Hypertension     Left leg pain     Migraine     Sleep apnea     Stroke [facility])             Past Surgical History:  Procedure Laterality Date   ABDOMINAL HYSTERECTOMY       ANTERIOR  LATERAL LUMBAR FUSION WITH PERCUTANEOUS SCREW 1 LEVEL Left 02/11/2018    Procedure: LEFT LATERAL LUMBAR  FOUR-FIVE INTERBODY FUSION WITH INSTRUMENTATION AND ALLOGRAFT;  Surgeon: Phylliss Bob, MD;  Location: [facility];  Service: Orthopedics;  Laterality: Left;  LEFT LATERAL LUMBAR  FOUR-FIVE INTERBODY FUSION WITH INSTRUMENTATION AND ALLOGRAFT   BACK SURGERY        Lumbar fusion L5-S1   DIABETES       EYE SURGERY Bilateral     KNEE SURGERY       l 4-l5 status post lateral posterior fusion January 30,2020       NEUROFORAMINAL STENOSIS Left      Severe L4-L5, involving the level above previous fusion.   PARTIAL HYSTERECTOMY       RADICULOPATHY Left      L4, Secondary to an L4-L5 Spondylolisthesis   SHOULDER SURGERY               Family History  Problem Relation Age of Onset   Diabetes Mother     Stroke Father     Cancer  Paternal Aunt     Cancer Paternal Aunt     Diabetes Brother     Colon cancer Neg Hx     Esophageal cancer Neg Hx     Rectal cancer Neg Hx     Stomach cancer Neg Hx        SOCIAL HISTORY: Social History         Socioeconomic History   Marital status: Single      Spouse name: Not on file   Number of children: 3   Years of education: 12   Highest education level: Not on file  Occupational History   Not on file  Tobacco Use   Smoking status: Former      Types: Cigarettes      Quit date: 12/13/2015      Years since quitting: 5.1   Smokeless tobacco: Never  Vaping Use   Vaping Use: Never used  Substance and Sexual Activity   Alcohol use: Yes      Comment: Occas   Drug use: Yes      Types: Marijuana   Sexual activity: Not Currently      Comment: 1st intercourse 61 yo-Fewer than 5 partners  Other Topics Concern   Not on file  Social History Narrative    Patient is single with 3 children.    Patient is right handed.    Patient has a high school education.    Patient drinks at least 3 cups of caffeine daily.    Social Determinants of Health        Financial Resource Strain: Medium Risk   Difficulty of Paying Living Expenses: Somewhat hard  Food Insecurity: No Food Insecurity   Worried About Charity fundraiser in the Last Year: Never true   Ran Out of Food in the Last Year: Never true  Transportation Needs: No Transportation Needs   Lack of Transportation (Medical): No   Lack of Transportation (Non-Medical): No  Physical Activity: Inactive   Days of Exercise per Week: 0 days   Minutes of Exercise per Session: 0 min  Stress: Stress Concern Present   Feeling of Stress : To some extent  Social Connections: Socially Isolated   Frequency of Communication with Friends and Family: Three times a week   Frequency of Social Gatherings with Friends and Family: More than three times a week   Attends Religious Services: Never   Marine scientist or Organizations: No   Attends Music therapist: Never   Marital Status: Separated  Intimate Partner Violence: Not At Risk   Fear of Current or Ex-Partner: No   Emotionally Abused: No   Physically Abused: No   Sexually Abused: No      No Known Allergies         Current Outpatient Medications  Medication Sig Dispense Refill   Accu-Chek FastClix Lancets MISC TEST 4 TIMES DAILY. DX E11.9 306 each 3   albuterol (PROVENTIL) (2.5 MG/3ML) 0.083% nebulizer solution Take 2.5 mg by nebulization every 6 (six) hours as needed for wheezing.       albuterol (VENTOLIN HFA) 108 (90 Base) MCG/ACT inhaler Inhale 2 puffs into the lungs daily as needed for wheezing or shortness of breath. 18 g 2   Blood Glucose Calibration (ACCU-CHEK GUIDE CONTROL) LIQD 1 each by In Vitro route every 30 (thirty) days. Needs Accu-check guide monitor system. DX. E11.9 (Patient taking differently: 1 each by In Vitro route every 30 (thirty) days. Needs Accu-check guide monitor system.  DX. E11.9 2 times daily) 3 each 3   blood glucose meter kit and supplies Dispense based on patient and insurance preference. Use up  to four times daily as directed. (FOR ICD-10 E10.9, E11.9). 1 each 0   calcium-vitamin D (OSCAL WITH D) 500-200 MG-UNIT per tablet Take 1 tablet by mouth once a week.       Cholecalciferol (VITAMIN D3) 1.25 MG (50000 UT) CAPS Take 1 capsule by mouth once a week. 4 capsule 0   Continuous Blood Gluc Sensor (DEXCOM G6 SENSOR) MISC 1 Device by Does not apply route as directed. 9 each 3   Continuous Blood Gluc Transmit (DEXCOM G6 TRANSMITTER) MISC 1 Device by Does not apply route as directed. 1 each 3   diclofenac Sodium (VOLTAREN) 1 % GEL Apply 2 g topically 4 (four) times daily. (Patient taking differently: Apply 2 g topically 4 (four) times daily as needed (pain).) 100 g 1   DULoxetine (CYMBALTA) 60 MG capsule Take 1 capsule (60 mg total) by mouth at bedtime. 90 capsule 3   empagliflozin (JARDIANCE) 25 MG TABS tablet Take 1 tablet (25 mg total) by mouth daily before breakfast. 90 tablet 3   ferrous sulfate 325 (65 FE) MG tablet Take 1 tablet (325 mg total) by mouth every other day. (Patient taking differently: Take 325 mg by mouth once a week.) 45 tablet 3   fluticasone (FLONASE) 50 MCG/ACT nasal spray Place 2 sprays into both nostrils daily as needed for allergies. 16 g 5   gabapentin (NEURONTIN) 300 MG capsule Take 1 capsule (300 mg total) by mouth 2 (two) times daily. 180 capsule 3   insulin aspart (NOVOLOG FLEXPEN) 100 UNIT/ML FlexPen Max daily 30 units (Patient taking differently: Inject 1-4 Units into the skin 3 (three) times daily as needed (blood sugar over 150). Max daily 30 units) 15 mL 6   insulin glargine (LANTUS SOLOSTAR) 100 UNIT/ML Solostar Pen Inject 26 Units into the skin daily. 30 mL 3   Insulin Pen Needle 31G X 5 MM MISC 1 Units by Does not apply route in the morning, at noon, in the evening, and at bedtime. 400 each 1   meloxicam (MOBIC) 15 MG tablet Take 1 tablet (15 mg total) by mouth daily. With meals 90 tablet 0   metFORMIN (GLUCOPHAGE-XR) 500 MG 24 hr tablet Take 1 tablet (500  mg total) by mouth in the morning and at bedtime. 180 tablet 3   ONETOUCH VERIO test strip CHECK UP TO 4 TIMES A DAY AS DIRECTED 100 strip 2   rosuvastatin (CRESTOR) 20 MG tablet Take 1 tablet (20 mg total) by mouth daily. 90 tablet 3    No current facility-administered medications for this visit.      REVIEW OF SYSTEMS:  [X]  denotes positive finding, [ ]  denotes negative finding Cardiac   Comments:  Chest pain or chest pressure:      Shortness of breath upon exertion:      Short of breath when lying flat:      Irregular heart rhythm:             Vascular      Pain in calf, thigh, or hip brought on by ambulation: x left  Pain in feet at night that wakes you up from your sleep:       Blood clot in your veins:      Leg swelling:              Pulmonary  Oxygen at home:      Productive cough:       Wheezing:              Neurologic      Sudden weakness in arms or legs:       Sudden numbness in arms or legs:       Sudden onset of difficulty speaking or slurred speech:      Temporary loss of vision in one eye:       Problems with dizziness:              Gastrointestinal      Blood in stool:       Vomited blood:              Genitourinary      Burning when urinating:       Blood in urine:             Psychiatric      Major depression:              Hematologic      Bleeding problems:      Problems with blood clotting too easily:             Skin      Rashes or ulcers:             Constitutional      Fever or chills:          PHYSICAL EXAM:    Vitals:    02/06/21 0932  BP: 120/86  Pulse: 79  Resp: 16  Temp: 97.8 F (36.6 C)  TempSrc: Temporal  SpO2: 97%  Weight: 205 lb (93 kg)  Height: _0  (1.549 m)      GENERAL: The patient is a well-nourished female, in no acute distress. The vital signs are documented above. CARDIAC: There is a regular rate and rhythm.  VASCULAR:  Palpable femoral pulses bilaterally Right DP palpable No palpable left pedal  pulses No tissue loss PULMONARY: No respiratory distress ABDOMEN: Soft and non-tender. MUSCULOSKELETAL: There are no major deformities or cyanosis. NEUROLOGIC: No focal weakness or paresthesias are detected. SKIN: There are no ulcers or rashes noted. PSYCHIATRIC: The patient has a normal affect.   DATA:    ABIs 01/22/2021 1.07 on the right biphasic and 0.85 on the left monophasic with a toe pressure of 0 (previous ABI >1 triphasic in 2020)   Assessment/Plan:   61 year old female presents for evaluation of peripheral arterial disease.  Her symptoms are consistent with lifestyle limiting claudication in the left lower extremity.  I discussed claudication is not typically limb threatening situation and we manage this conservatively with exercise and walking therapies as well as statin and aspirin therapy.  She has made an attempt to walk and used to go to the mall on a daily basis.  She's had significant progression of symptoms and now is really unable to walk to the car or go to the grocery store with left calf cramping.  We discussed alternative would be lower extremity arteriogram with possible intervention if she has endovascular options.  She wishes to proceed with intervention as she feels that she is really unable to do her activities of daily living and care for her grandchildren. Discussed may need open surgical bypass.  We will get her scheduled in the Cath Lab this week.  Risk benefits discussed.     Marty Heck, MD [facility] Office: [phone]

## 2021-02-09 ENCOUNTER — Other Ambulatory Visit: Payer: Self-pay

## 2021-02-09 ENCOUNTER — Encounter (HOSPITAL_COMMUNITY): Payer: Self-pay | Admitting: Vascular Surgery

## 2021-02-09 DIAGNOSIS — M25632 Stiffness of left wrist, not elsewhere classified: Secondary | ICD-10-CM | POA: Diagnosis not present

## 2021-02-09 DIAGNOSIS — G5602 Carpal tunnel syndrome, left upper limb: Secondary | ICD-10-CM | POA: Diagnosis not present

## 2021-02-09 DIAGNOSIS — R29898 Other symptoms and signs involving the musculoskeletal system: Secondary | ICD-10-CM | POA: Diagnosis not present

## 2021-02-09 DIAGNOSIS — G5622 Lesion of ulnar nerve, left upper limb: Secondary | ICD-10-CM | POA: Diagnosis not present

## 2021-02-09 LAB — SARS CORONAVIRUS 2 (TAT 6-24 HRS): SARS Coronavirus 2: NEGATIVE

## 2021-02-09 NOTE — Progress Notes (Signed)
Spoke with pt for pre-op call. Pt denies cardiac history. Pt has hx of HTN but not on any medications. Pt is a type 2 diabetic. Last A1C was 7.9 on 11/28/20. Instructed pt to hold her Jardiance Sunday, 02/11/21 and Monday AM. Pt is to hold her Metformin Monday AM. Instructed pt to take 1/2 of her regular dose of Basaglar insulin Sunday PM, she will take 13 units. Instructed pt to check her blood sugar when she wakes up Monday morning and every 2 hours until she leaves for the hospital. If blood sugar is >220 take 1/2 of usual correction dose of Novolog insulin. If blood sugar is 70 or below, treat with 1/2 cup of clear juice (apple or cranberry) and recheck blood sugar 15 minutes after drinking juice. If blood sugar continues to be 70 or below, call the Short Stay department and ask to speak to a nurse. Pt voiced understanding.   Covid test done 02/08/21 and it was negative.

## 2021-02-12 ENCOUNTER — Inpatient Hospital Stay (HOSPITAL_COMMUNITY)
Admission: RE | Admit: 2021-02-12 | Discharge: 2021-02-13 | DRG: 254 | Disposition: A | Discharge status: Home / Self Care | Payer: Medicare HMO | Source: Ambulatory Visit | Attending: Vascular Surgery | Admitting: Vascular Surgery

## 2021-02-12 ENCOUNTER — Other Ambulatory Visit: Payer: Self-pay

## 2021-02-12 ENCOUNTER — Encounter (HOSPITAL_COMMUNITY)
Admission: RE | Disposition: A | Discharge status: Home / Self Care | Payer: Self-pay | Source: Ambulatory Visit | Attending: Vascular Surgery

## 2021-02-12 ENCOUNTER — Inpatient Hospital Stay (HOSPITAL_COMMUNITY): Payer: Medicare HMO

## 2021-02-12 ENCOUNTER — Encounter (HOSPITAL_COMMUNITY): Payer: Self-pay | Admitting: Vascular Surgery

## 2021-02-12 DIAGNOSIS — Z8673 Personal history of transient ischemic attack (TIA), and cerebral infarction without residual deficits: Secondary | ICD-10-CM

## 2021-02-12 DIAGNOSIS — Z823 Family history of stroke: Secondary | ICD-10-CM

## 2021-02-12 DIAGNOSIS — Z809 Family history of malignant neoplasm, unspecified: Secondary | ICD-10-CM

## 2021-02-12 DIAGNOSIS — Z87891 Personal history of nicotine dependence: Secondary | ICD-10-CM

## 2021-02-12 DIAGNOSIS — Z794 Long term (current) use of insulin: Secondary | ICD-10-CM

## 2021-02-12 DIAGNOSIS — I70202 Unspecified atherosclerosis of native arteries of extremities, left leg: Secondary | ICD-10-CM | POA: Diagnosis not present

## 2021-02-12 DIAGNOSIS — I70212 Atherosclerosis of native arteries of extremities with intermittent claudication, left leg: Secondary | ICD-10-CM | POA: Diagnosis not present

## 2021-02-12 DIAGNOSIS — E7800 Pure hypercholesterolemia, unspecified: Secondary | ICD-10-CM | POA: Diagnosis not present

## 2021-02-12 DIAGNOSIS — E785 Hyperlipidemia, unspecified: Secondary | ICD-10-CM | POA: Diagnosis not present

## 2021-02-12 DIAGNOSIS — Z7984 Long term (current) use of oral hypoglycemic drugs: Secondary | ICD-10-CM

## 2021-02-12 DIAGNOSIS — I10 Essential (primary) hypertension: Secondary | ICD-10-CM | POA: Diagnosis present

## 2021-02-12 DIAGNOSIS — E1151 Type 2 diabetes mellitus with diabetic peripheral angiopathy without gangrene: Secondary | ICD-10-CM | POA: Diagnosis not present

## 2021-02-12 DIAGNOSIS — I70222 Atherosclerosis of native arteries of extremities with rest pain, left leg: Secondary | ICD-10-CM | POA: Diagnosis present

## 2021-02-12 DIAGNOSIS — Z833 Family history of diabetes mellitus: Secondary | ICD-10-CM

## 2021-02-12 DIAGNOSIS — Z791 Long term (current) use of non-steroidal anti-inflammatories (NSAID): Secondary | ICD-10-CM

## 2021-02-12 DIAGNOSIS — Z79899 Other long term (current) drug therapy: Secondary | ICD-10-CM

## 2021-02-12 DIAGNOSIS — Z981 Arthrodesis status: Secondary | ICD-10-CM

## 2021-02-12 DIAGNOSIS — M1991 Primary osteoarthritis, unspecified site: Secondary | ICD-10-CM | POA: Diagnosis not present

## 2021-02-12 DIAGNOSIS — I739 Peripheral vascular disease, unspecified: Secondary | ICD-10-CM

## 2021-02-12 DIAGNOSIS — G4730 Sleep apnea, unspecified: Secondary | ICD-10-CM | POA: Diagnosis not present

## 2021-02-12 DIAGNOSIS — R69 Illness, unspecified: Secondary | ICD-10-CM | POA: Diagnosis not present

## 2021-02-12 HISTORY — PX: ENDARTERECTOMY FEMORAL: SHX5804

## 2021-02-12 HISTORY — DX: Unspecified asthma, uncomplicated: J45.909

## 2021-02-12 LAB — URINALYSIS, MICROSCOPIC (REFLEX)

## 2021-02-12 LAB — CBC
HCT: 37 % (ref 36.0–46.0)
Hemoglobin: 11.4 g/dL — ABNORMAL LOW (ref 12.0–15.0)
MCH: 26.9 pg (ref 26.0–34.0)
MCHC: 30.8 g/dL (ref 30.0–36.0)
MCV: 87.3 fL (ref 80.0–100.0)
Platelets: 238 10*3/uL (ref 150–400)
RBC: 4.24 MIL/uL (ref 3.87–5.11)
RDW: 15.9 % — ABNORMAL HIGH (ref 11.5–15.5)
WBC: 11.8 10*3/uL — ABNORMAL HIGH (ref 4.0–10.5)
nRBC: 0 % (ref 0.0–0.2)

## 2021-02-12 LAB — URINALYSIS, ROUTINE W REFLEX MICROSCOPIC
Bilirubin Urine: NEGATIVE
Glucose, UA: 100 mg/dL — AB
Ketones, ur: NEGATIVE mg/dL
Leukocytes,Ua: NEGATIVE
Nitrite: NEGATIVE
Protein, ur: NEGATIVE mg/dL
Specific Gravity, Urine: 1.025 (ref 1.005–1.030)
pH: 5.5 (ref 5.0–8.0)

## 2021-02-12 LAB — COMPREHENSIVE METABOLIC PANEL
ALT: 10 U/L (ref 0–44)
AST: 10 U/L — ABNORMAL LOW (ref 15–41)
Albumin: 3.4 g/dL — ABNORMAL LOW (ref 3.5–5.0)
Alkaline Phosphatase: 57 U/L (ref 38–126)
Anion gap: 9 (ref 5–15)
BUN: 11 mg/dL (ref 6–20)
CO2: 24 mmol/L (ref 22–32)
Calcium: 9.2 mg/dL (ref 8.9–10.3)
Chloride: 105 mmol/L (ref 98–111)
Creatinine, Ser: 0.66 mg/dL (ref 0.44–1.00)
GFR, Estimated: >60 mL/min (ref >60)
Glucose, Bld: 271 mg/dL — ABNORMAL HIGH (ref 70–99)
Potassium: 4.2 mmol/L (ref 3.5–5.1)
Sodium: 138 mmol/L (ref 135–145)
Total Bilirubin: 0.3 mg/dL (ref 0.3–1.2)
Total Protein: 6.6 g/dL (ref 6.5–8.1)

## 2021-02-12 LAB — GLUCOSE, CAPILLARY
Glucose-Capillary: 273 mg/dL — ABNORMAL HIGH (ref 70–99)
Glucose-Capillary: 227 mg/dL — ABNORMAL HIGH (ref 70–99)
Glucose-Capillary: 175 mg/dL — ABNORMAL HIGH (ref 70–99)
Glucose-Capillary: 131 mg/dL — ABNORMAL HIGH (ref 70–99)
Glucose-Capillary: 156 mg/dL — ABNORMAL HIGH (ref 70–99)

## 2021-02-12 LAB — HEMOGLOBIN A1C
Hgb A1c MFr Bld: 8.7 % — ABNORMAL HIGH (ref 4.8–5.6)
Mean Plasma Glucose: 202.99 mg/dL

## 2021-02-12 LAB — SURGICAL PCR SCREEN
MRSA, PCR: NEGATIVE
Staphylococcus aureus: NEGATIVE

## 2021-02-12 LAB — TYPE AND SCREEN
ABO/RH(D): A NEG
Antibody Screen: NEGATIVE

## 2021-02-12 LAB — PROTIME-INR
INR: 1 (ref 0.8–1.2)
Prothrombin Time: 13 seconds (ref 11.4–15.2)

## 2021-02-12 LAB — APTT: aPTT: 26 seconds (ref 24–36)

## 2021-02-12 SURGERY — ENDARTERECTOMY, FEMORAL
Anesthesia: General | Site: Groin | Laterality: Left

## 2021-02-12 MED ORDER — INSULIN GLARGINE-YFGN 100 UNIT/ML ~~LOC~~ SOLN
26.0000 [IU] | Freq: Every day | SUBCUTANEOUS | Status: DC
  Administered 2021-02-12: 26 [IU] via SUBCUTANEOUS
  Filled 2021-02-12 (×2): qty 0.26

## 2021-02-12 MED ORDER — PROPOFOL 10 MG/ML IV BOLUS
INTRAVENOUS | Status: AC
  Filled 2021-02-12: qty 20

## 2021-02-12 MED ORDER — METOPROLOL TARTRATE 5 MG/5ML IV SOLN: 2.0000 mg | INTRAVENOUS | Status: DC | PRN

## 2021-02-12 MED ORDER — HEPARIN SODIUM (PORCINE) 5000 UNIT/ML IJ SOLN
5000.0000 [IU] | Freq: Three times a day (TID) | INTRAMUSCULAR | Status: DC
  Administered 2021-02-13: 5000 [IU] via SUBCUTANEOUS
  Filled 2021-02-12: qty 1

## 2021-02-12 MED ORDER — SODIUM CHLORIDE 0.9 % IV SOLN: 500.0000 mL | Freq: Once | INTRAVENOUS | Status: DC | PRN

## 2021-02-12 MED ORDER — PANTOPRAZOLE SODIUM 40 MG PO TBEC
40.0000 mg | DELAYED_RELEASE_TABLET | Freq: Every day | ORAL | Status: DC
  Administered 2021-02-12 – 2021-02-13 (×2): 40 mg via ORAL
  Filled 2021-02-12 (×2): qty 1

## 2021-02-12 MED ORDER — MIDAZOLAM HCL 2 MG/2ML IJ SOLN
INTRAMUSCULAR | Status: DC | PRN
  Administered 2021-02-12: 2 mg via INTRAVENOUS

## 2021-02-12 MED ORDER — BISACODYL 10 MG RE SUPP: 10.0000 mg | Freq: Every day | RECTAL | Status: DC | PRN

## 2021-02-12 MED ORDER — ACETAMINOPHEN 500 MG PO TABS: 1000.0000 mg | ORAL_TABLET | Freq: Once | ORAL | Status: AC

## 2021-02-12 MED ORDER — DOCUSATE SODIUM 100 MG PO CAPS
100.0000 mg | ORAL_CAPSULE | Freq: Every day | ORAL | Status: DC
  Administered 2021-02-13: 100 mg via ORAL
  Filled 2021-02-12: qty 1

## 2021-02-12 MED ORDER — METFORMIN HCL ER 500 MG PO TB24
500.0000 mg | ORAL_TABLET | Freq: Two times a day (BID) | ORAL | Status: DC
  Administered 2021-02-12 – 2021-02-13 (×2): 500 mg via ORAL
  Filled 2021-02-12 (×2): qty 1

## 2021-02-12 MED ORDER — INSULIN ASPART 100 UNIT/ML IJ SOLN: 8.0000 [IU] | Freq: Once | INTRAMUSCULAR | Status: AC

## 2021-02-12 MED ORDER — POTASSIUM CHLORIDE CRYS ER 20 MEQ PO TBCR: 20.0000 meq | EXTENDED_RELEASE_TABLET | Freq: Every day | ORAL | Status: DC | PRN

## 2021-02-12 MED ORDER — FENTANYL CITRATE (PF) 100 MCG/2ML IJ SOLN
INTRAMUSCULAR | Status: AC
  Filled 2021-02-12: qty 4

## 2021-02-12 MED ORDER — MIDAZOLAM HCL 2 MG/2ML IJ SOLN
INTRAMUSCULAR | Status: AC
  Filled 2021-02-12: qty 2

## 2021-02-12 MED ORDER — LIDOCAINE 2% (20 MG/ML) 5 ML SYRINGE
INTRAMUSCULAR | Status: AC
  Filled 2021-02-12: qty 5

## 2021-02-12 MED ORDER — PHENYLEPHRINE HCL-NACL 20-0.9 MG/250ML-% IV SOLN
INTRAVENOUS | Status: DC | PRN
Start: 2021-02-12 — End: 2021-02-12
  Administered 2021-02-12: 25 ug/min via INTRAVENOUS

## 2021-02-12 MED ORDER — DICLOFENAC SODIUM 1 % EX GEL: 2.0000 g | Freq: Four times a day (QID) | CUTANEOUS | Status: DC | PRN

## 2021-02-12 MED ORDER — PROPOFOL 10 MG/ML IV BOLUS
INTRAVENOUS | Status: DC | PRN
  Administered 2021-02-12: 130 mg via INTRAVENOUS

## 2021-02-12 MED ORDER — PROMETHAZINE HCL 25 MG/ML IJ SOLN: 6.2500 mg | INTRAMUSCULAR | Status: DC | PRN

## 2021-02-12 MED ORDER — FLUTICASONE PROPIONATE 50 MCG/ACT NA SUSP: 2.0000 | Freq: Every day | NASAL | Status: DC | PRN

## 2021-02-12 MED ORDER — POLYETHYLENE GLYCOL 3350 17 G PO PACK: 17.0000 g | PACK | Freq: Every day | ORAL | Status: DC | PRN

## 2021-02-12 MED ORDER — CEFAZOLIN SODIUM-DEXTROSE 2-4 GM/100ML-% IV SOLN
INTRAVENOUS | Status: AC
  Filled 2021-02-12: qty 100

## 2021-02-12 MED ORDER — DEXAMETHASONE SODIUM PHOSPHATE 10 MG/ML IJ SOLN
INTRAMUSCULAR | Status: AC
  Filled 2021-02-12: qty 1

## 2021-02-12 MED ORDER — GUAIFENESIN-DM 100-10 MG/5ML PO SYRP: 15.0000 mL | ORAL_SOLUTION | ORAL | Status: DC | PRN

## 2021-02-12 MED ORDER — LACTATED RINGERS IV SOLN: INTRAVENOUS | Status: DC

## 2021-02-12 MED ORDER — ALBUTEROL SULFATE HFA 108 (90 BASE) MCG/ACT IN AERS: 2.0000 | INHALATION_SPRAY | Freq: Every day | RESPIRATORY_TRACT | Status: DC | PRN

## 2021-02-12 MED ORDER — PHENYLEPHRINE 40 MCG/ML (10ML) SYRINGE FOR IV PUSH (FOR BLOOD PRESSURE SUPPORT)
PREFILLED_SYRINGE | INTRAVENOUS | Status: DC | PRN
  Administered 2021-02-12: 80 ug via INTRAVENOUS

## 2021-02-12 MED ORDER — 0.9 % SODIUM CHLORIDE (POUR BTL) OPTIME
TOPICAL | Status: DC | PRN
  Administered 2021-02-12: 2000 mL

## 2021-02-12 MED ORDER — GABAPENTIN 300 MG PO CAPS
300.0000 mg | ORAL_CAPSULE | Freq: Two times a day (BID) | ORAL | Status: DC
  Administered 2021-02-12 – 2021-02-13 (×2): 300 mg via ORAL
  Filled 2021-02-12 (×2): qty 1

## 2021-02-12 MED ORDER — HEPARIN SODIUM (PORCINE) 1000 UNIT/ML IJ SOLN
INTRAMUSCULAR | Status: DC | PRN
  Administered 2021-02-12: 10000 [IU] via INTRAVENOUS

## 2021-02-12 MED ORDER — GLYCOPYRROLATE PF 0.2 MG/ML IJ SOSY
PREFILLED_SYRINGE | INTRAMUSCULAR | Status: DC | PRN
  Administered 2021-02-12: .2 mg via INTRAVENOUS

## 2021-02-12 MED ORDER — ONDANSETRON HCL 4 MG/2ML IJ SOLN
INTRAMUSCULAR | Status: DC | PRN
  Administered 2021-02-12: 4 mg via INTRAVENOUS

## 2021-02-12 MED ORDER — INSULIN ASPART 100 UNIT/ML IJ SOLN
INTRAMUSCULAR | Status: AC
  Administered 2021-02-12: 8 [IU] via SUBCUTANEOUS
  Filled 2021-02-12: qty 1

## 2021-02-12 MED ORDER — ALBUTEROL SULFATE (2.5 MG/3ML) 0.083% IN NEBU: 2.5000 mg | INHALATION_SOLUTION | Freq: Four times a day (QID) | RESPIRATORY_TRACT | Status: DC | PRN

## 2021-02-12 MED ORDER — TRAMADOL HCL 50 MG PO TABS: 50.0000 mg | ORAL_TABLET | Freq: Four times a day (QID) | ORAL | Status: DC | PRN

## 2021-02-12 MED ORDER — CEFAZOLIN SODIUM-DEXTROSE 2-4 GM/100ML-% IV SOLN
2.0000 g | INTRAVENOUS | Status: AC
  Administered 2021-02-12: 2 g via INTRAVENOUS

## 2021-02-12 MED ORDER — OYSTER SHELL CALCIUM/D3 500-5 MG-MCG PO TABS
1.0000 | ORAL_TABLET | Freq: Every day | ORAL | Status: DC
  Administered 2021-02-13: 1 via ORAL
  Filled 2021-02-12: qty 1

## 2021-02-12 MED ORDER — ALUM & MAG HYDROXIDE-SIMETH 200-200-20 MG/5ML PO SUSP: 15.0000 mL | ORAL | Status: DC | PRN

## 2021-02-12 MED ORDER — OXYCODONE-ACETAMINOPHEN 5-325 MG PO TABS
1.0000 | ORAL_TABLET | ORAL | Status: DC | PRN
  Administered 2021-02-12 – 2021-02-13 (×3): 2 via ORAL
  Filled 2021-02-12 (×2): qty 2

## 2021-02-12 MED ORDER — SODIUM CHLORIDE 0.9 % IV SOLN: INTRAVENOUS | Status: DC

## 2021-02-12 MED ORDER — INSULIN ASPART 100 UNIT/ML FLEXPEN: 1.0000 [IU] | PEN_INJECTOR | Freq: Three times a day (TID) | SUBCUTANEOUS | Status: DC | PRN

## 2021-02-12 MED ORDER — CHLORHEXIDINE GLUCONATE 0.12 % MT SOLN
OROMUCOSAL | Status: AC
  Administered 2021-02-12: 15 mL
  Filled 2021-02-12: qty 15

## 2021-02-12 MED ORDER — FENTANYL CITRATE (PF) 100 MCG/2ML IJ SOLN
25.0000 ug | INTRAMUSCULAR | Status: DC | PRN
  Administered 2021-02-12 (×3): 50 ug via INTRAVENOUS

## 2021-02-12 MED ORDER — LIDOCAINE 2% (20 MG/ML) 5 ML SYRINGE
INTRAMUSCULAR | Status: DC | PRN
  Administered 2021-02-12: 100 mg via INTRAVENOUS

## 2021-02-12 MED ORDER — MORPHINE SULFATE (PF) 2 MG/ML IV SOLN: 2.0000 mg | INTRAVENOUS | Status: DC | PRN

## 2021-02-12 MED ORDER — ROCURONIUM BROMIDE 10 MG/ML (PF) SYRINGE
PREFILLED_SYRINGE | INTRAVENOUS | Status: AC
  Filled 2021-02-12: qty 10

## 2021-02-12 MED ORDER — PHENOL 1.4 % MT LIQD: 1.0000 | OROMUCOSAL | Status: DC | PRN

## 2021-02-12 MED ORDER — ASPIRIN EC 81 MG PO TBEC
81.0000 mg | DELAYED_RELEASE_TABLET | Freq: Every day | ORAL | Status: DC
  Administered 2021-02-13: 81 mg via ORAL
  Filled 2021-02-12: qty 1

## 2021-02-12 MED ORDER — MAGNESIUM SULFATE 2 GM/50ML IV SOLN: 2.0000 g | Freq: Every day | INTRAVENOUS | Status: DC | PRN

## 2021-02-12 MED ORDER — ACETAMINOPHEN 500 MG PO TABS
ORAL_TABLET | ORAL | Status: AC
  Administered 2021-02-12: 1000 mg via ORAL
  Filled 2021-02-12: qty 2

## 2021-02-12 MED ORDER — CHLORHEXIDINE GLUCONATE CLOTH 2 % EX PADS: 6.0000 | MEDICATED_PAD | Freq: Once | CUTANEOUS | Status: DC

## 2021-02-12 MED ORDER — FENTANYL CITRATE (PF) 100 MCG/2ML IJ SOLN
INTRAMUSCULAR | Status: DC | PRN
  Administered 2021-02-12: 50 ug via INTRAVENOUS
  Administered 2021-02-12: 100 ug via INTRAVENOUS
  Administered 2021-02-12 (×2): 50 ug via INTRAVENOUS

## 2021-02-12 MED ORDER — PROTAMINE SULFATE 10 MG/ML IV SOLN
INTRAVENOUS | Status: DC | PRN
  Administered 2021-02-12: 50 mg via INTRAVENOUS

## 2021-02-12 MED ORDER — CEFAZOLIN SODIUM-DEXTROSE 2-4 GM/100ML-% IV SOLN
2.0000 g | Freq: Three times a day (TID) | INTRAVENOUS | Status: AC
  Administered 2021-02-12 – 2021-02-13 (×2): 2 g via INTRAVENOUS
  Filled 2021-02-12 (×2): qty 100

## 2021-02-12 MED ORDER — HEPARIN 6000 UNIT IRRIGATION SOLUTION
Status: AC
  Filled 2021-02-12: qty 500

## 2021-02-12 MED ORDER — FENTANYL CITRATE (PF) 250 MCG/5ML IJ SOLN
INTRAMUSCULAR | Status: AC
  Filled 2021-02-12: qty 5

## 2021-02-12 MED ORDER — FERROUS SULFATE 325 (65 FE) MG PO TABS
325.0000 mg | ORAL_TABLET | Freq: Every day | ORAL | Status: DC
  Administered 2021-02-13: 325 mg via ORAL
  Filled 2021-02-12: qty 1

## 2021-02-12 MED ORDER — ACETAMINOPHEN 325 MG PO TABS: 325.0000 mg | ORAL_TABLET | ORAL | Status: DC | PRN

## 2021-02-12 MED ORDER — HYDRALAZINE HCL 20 MG/ML IJ SOLN: 5.0000 mg | INTRAMUSCULAR | Status: DC | PRN

## 2021-02-12 MED ORDER — OXYCODONE-ACETAMINOPHEN 5-325 MG PO TABS
ORAL_TABLET | ORAL | Status: AC
  Filled 2021-02-12: qty 2

## 2021-02-12 MED ORDER — ACETAMINOPHEN 650 MG RE SUPP: 325.0000 mg | RECTAL | Status: DC | PRN

## 2021-02-12 MED ORDER — INSULIN ASPART 100 UNIT/ML IJ SOLN
0.0000 [IU] | Freq: Three times a day (TID) | INTRAMUSCULAR | Status: DC
  Administered 2021-02-12: 2 [IU] via SUBCUTANEOUS
  Administered 2021-02-13: 3 [IU] via SUBCUTANEOUS

## 2021-02-12 MED ORDER — DULOXETINE HCL 60 MG PO CPEP
60.0000 mg | ORAL_CAPSULE | Freq: Every day | ORAL | Status: DC
  Administered 2021-02-12: 60 mg via ORAL
  Filled 2021-02-12: qty 1

## 2021-02-12 MED ORDER — ROSUVASTATIN CALCIUM 20 MG PO TABS
20.0000 mg | ORAL_TABLET | ORAL | Status: DC
  Administered 2021-02-12: 20 mg via ORAL
  Filled 2021-02-12: qty 1

## 2021-02-12 MED ORDER — ONDANSETRON HCL 4 MG/2ML IJ SOLN
INTRAMUSCULAR | Status: AC
  Filled 2021-02-12: qty 2

## 2021-02-12 MED ORDER — ONDANSETRON HCL 4 MG/2ML IJ SOLN: 4.0000 mg | Freq: Four times a day (QID) | INTRAMUSCULAR | Status: DC | PRN

## 2021-02-12 MED ORDER — PROTAMINE SULFATE 10 MG/ML IV SOLN
INTRAVENOUS | Status: AC
  Filled 2021-02-12: qty 5

## 2021-02-12 MED ORDER — HEPARIN SODIUM (PORCINE) 1000 UNIT/ML IJ SOLN
INTRAMUSCULAR | Status: AC
  Filled 2021-02-12: qty 10

## 2021-02-12 MED ORDER — HEPARIN 6000 UNIT IRRIGATION SOLUTION
Status: DC | PRN
Start: 2021-02-12 — End: 2021-02-12
  Administered 2021-02-12: 1

## 2021-02-12 MED ORDER — LABETALOL HCL 5 MG/ML IV SOLN: 10.0000 mg | INTRAVENOUS | Status: DC | PRN

## 2021-02-12 MED ORDER — SUGAMMADEX SODIUM 200 MG/2ML IV SOLN
INTRAVENOUS | Status: DC | PRN
  Administered 2021-02-12: 200 mg via INTRAVENOUS

## 2021-02-12 MED ORDER — ROCURONIUM BROMIDE 10 MG/ML (PF) SYRINGE
PREFILLED_SYRINGE | INTRAVENOUS | Status: DC | PRN
  Administered 2021-02-12: 60 mg via INTRAVENOUS
  Administered 2021-02-12: 20 mg via INTRAVENOUS

## 2021-02-12 SURGICAL SUPPLY — 37 items
BAG COUNTER SPONGE SURGICOUNT (BAG) ×2 IMPLANT
CANISTER SUCT 3000ML PPV (MISCELLANEOUS) ×2 IMPLANT
CLIP VESOCCLUDE MED 24/CT (CLIP) ×2 IMPLANT
CLIP VESOCCLUDE SM WIDE 24/CT (CLIP) ×2 IMPLANT
DERMABOND ADVANCED (GAUZE/BANDAGES/DRESSINGS) ×1
DERMABOND ADVANCED .7 DNX12 (GAUZE/BANDAGES/DRESSINGS) ×1 IMPLANT
DRAIN CHANNEL 15F RND FF W/TCR (WOUND CARE) IMPLANT
ELECT REM PT RETURN 9FT ADLT (ELECTROSURGICAL) ×2
ELECTRODE REM PT RTRN 9FT ADLT (ELECTROSURGICAL) ×1 IMPLANT
EVACUATOR SILICONE 100CC (DRAIN) IMPLANT
GLOVE SRG 8 PF TXTR STRL LF DI (GLOVE) ×1 IMPLANT
GLOVE SURG ENC MOIS LTX SZ7.5 (GLOVE) ×2 IMPLANT
GLOVE SURG UNDER POLY LF SZ8 (GLOVE) ×2
GOWN STRL REUS W/ TWL LRG LVL3 (GOWN DISPOSABLE) ×3 IMPLANT
GOWN STRL REUS W/ TWL XL LVL3 (GOWN DISPOSABLE) ×2 IMPLANT
GOWN STRL REUS W/TWL LRG LVL3 (GOWN DISPOSABLE) ×6
GOWN STRL REUS W/TWL XL LVL3 (GOWN DISPOSABLE) ×4
HEMOSTAT SPONGE AVITENE ULTRA (HEMOSTASIS) IMPLANT
KIT BASIN OR (CUSTOM PROCEDURE TRAY) ×2 IMPLANT
KIT TURNOVER KIT B (KITS) ×2 IMPLANT
NS IRRIG 1000ML POUR BTL (IV SOLUTION) ×4 IMPLANT
PACK PERIPHERAL VASCULAR (CUSTOM PROCEDURE TRAY) ×2 IMPLANT
PAD ARMBOARD 7.5X6 YLW CONV (MISCELLANEOUS) ×4 IMPLANT
PATCH VASC XENOSURE 1CMX6CM (Vascular Products) ×2 IMPLANT
PATCH VASC XENOSURE 1X6 (Vascular Products) IMPLANT
PENCIL BUTTON HOLSTER BLD 10FT (ELECTRODE) ×1 IMPLANT
SUT MNCRL AB 4-0 PS2 18 (SUTURE) ×2 IMPLANT
SUT PROLENE 5 0 C 1 24 (SUTURE) ×4 IMPLANT
SUT PROLENE 6 0 BV (SUTURE) ×4 IMPLANT
SUT VIC AB 2-0 CT1 27 (SUTURE) ×4
SUT VIC AB 2-0 CT1 TAPERPNT 27 (SUTURE) ×1 IMPLANT
SUT VIC AB 3-0 SH 27 (SUTURE) ×2
SUT VIC AB 3-0 SH 27X BRD (SUTURE) ×1 IMPLANT
TOWEL GREEN STERILE (TOWEL DISPOSABLE) ×2 IMPLANT
TRAY FOLEY MTR SLVR 16FR STAT (SET/KITS/TRAYS/PACK) IMPLANT
UNDERPAD 30X36 HEAVY ABSORB (UNDERPADS AND DIAPERS) ×2 IMPLANT
WATER STERILE IRR 1000ML POUR (IV SOLUTION) ×2 IMPLANT

## 2021-02-12 NOTE — Discharge Instructions (Signed)
 [facility]  Discharge instructions  Lower Extremity Bypass Surgery  Please refer to the following instruction for your post-procedure care. Your surgeon or physician assistant will discuss any changes with you.  Activity  You are encouraged to walk as much as you can. You can slowly return to normal activities during the month after your surgery. Avoid strenuous activity and heavy lifting until your doctor tells you it's OK. Avoid activities such as vacuuming or swinging a golf club. Do not drive until your doctor give the OK and you are no longer taking prescription pain medications. It is also normal to have difficulty with sleep habits, eating and bowel movement after surgery. These will go away with time.  Bathing/Showering  Shower daily after you go home. Do not soak in a bathtub, hot tub, or swim until the incision heals completely.  Incision Care  Clean your incision with mild soap and water. Shower every day. Pat the area dry with a clean towel. You do not need a bandage unless otherwise instructed. Do not apply any ointments or creams to your incision. If you have open wounds you will be instructed how to care for them or a visiting nurse may be arranged for you. If you have staples or sutures along your incision they will be removed at your post-op appointment. You may have skin glue on your incision. Do not peel it off. It will come off on its own in about one week.  Wash the groin wound with soap and water daily and pat dry. (No tub bath-only shower)  Then put a dry gauze or washcloth in the groin to keep this area dry to help prevent wound infection.  Do this daily and as needed.  Do not use Vaseline or neosporin on your incisions.  Only use soap and water on your incisions and then protect and keep dry.  Diet  Resume your normal diet. There are no special food restrictions following this procedure. A low fat/ low cholesterol diet is  recommended for all patients with vascular disease. In order to heal from your surgery, it is CRITICAL to get adequate nutrition. Your body requires vitamins, minerals, and protein. Vegetables are the best source of vitamins and minerals. Vegetables also provide the perfect balance of protein. Processed food has little nutritional value, so try to avoid this.  Medications  Resume taking all your medications unless your doctor or physician assistant tells you not to. If your incision is causing pain, you may take over-the-counter pain relievers such as acetaminophen (Tylenol). If you were prescribed a stronger pain medication, please aware these medication can cause nausea and constipation. Prevent nausea by taking the medication with a snack or meal. Avoid constipation by drinking plenty of fluids and eating foods with high amount of fiber, such as fruits, vegetables, and grains. Take Colace 100 mg (an over-the-counter stool softener) twice a day as needed for constipation.  Do not take Tylenol if you are taking prescription pain medications.  Follow Up  Our office will schedule a follow up appointment 2-3 weeks following discharge.  Please call us immediately for any of the following conditions  Severe or worsening pain in your legs or feet while at rest or while walking Increase pain, redness, warmth, or drainage (pus) from your incision site(s) Fever of 101 degree or higher The swelling in your leg with the bypass suddenly worsens and becomes more painful than when you were in the hospital If you have   been instructed to feel your graft pulse then you should do so every day. If you can no longer feel this pulse, call the office immediately. Not all patients are given this instruction.  Leg swelling is common after leg bypass surgery.  The swelling should improve over a few months following surgery. To improve the swelling, you may elevate your legs above the level of your heart while you are  sitting or resting. Your surgeon or physician assistant may ask you to apply an ACE wrap or wear compression (TED) stockings to help to reduce swelling.  Reduce your risk of vascular disease  Stop smoking. If you would like help call [facility] at 1-800-QUIT-NOW (1-800-784-8669) or [place] at [phone].  Manage your cholesterol Maintain a desired weight Control your diabetes weight Control your diabetes Keep your blood pressure down  If you have any questions, please call the office at [phone]  

## 2021-02-12 NOTE — Op Note (Signed)
Date: February 12, 2021  Preoperative diagnosis: Lifestyle limiting claudication left lower extremity with high-grade common femoral artery stenosis  Postoperative diagnosis: Same  Procedure: Left common femoral endarterectomy with bovine pericardial patch angioplasty  Surgeon: Dr. Marty Heck, MD  Assistant: Leontine Locket, PA  Indications: 61 year old female seen with worsening lifestyle limiting claudication left lower extremity with monophasic waveforms at the ankle.  Angiogram confirmed high-grade left common femoral stenosis with a focal plaque.  She presents today for left common femoral endarterectomy after risk benefits discussed.  An assistant was needed for exposure and to expedite the case.  Findings: The left common femoral artery had a posterior plaque and also appeared to have a web within the artery and endarterectomy was completed with excellent inflow and I stopped the endarterectomy just before the takeoff of the SFA and profunda and the distal plaque was tacked down with 6-0 Prolene.  Bovine pericardial patch was sewn with excellent femoral pulse.  Anesthesia: General  Details: Patient was taken to the operating room after informed consent was obtained.  Placed on the operative table supine position.  General endotracheal anesthesia was induced.  The left groin was then prepped and draped in standard sterile fashion.  Timeout was performed.  Antibiotics were given.  Initially made a horizontal groin incision in the left groin above the inguinal crease.  Dissected down with Bovie cautery through the subcutaneous tissue and the femoral sheath was opened longitudinally.  Common femoral artery was quite small as demonstrated on arteriogram and this was dissected out up under the inguinal ligament including the epigastric branches and down to the SFA and profunda and all these branches were controlled with Vesseloops.  Patient was given 100 units/kg IV heparin.  I then used  Vesseloops on SFA and profunda branches.  Henly clamp was used on the distal external iliac artery.  The left common femoral was opened with 11 blade scalpel extended with Potts scissors.  We visualized this large posterior plaque in the midportion of the left common femoral artery as demonstrated on arteriogram with an associated web.  Penfield elevator was used and endarterectomy was performed and I truncated the plaque at the takeoff of the SFA and profunda given these vessels were widely patent on arteriogram.  We then had excellent inflow.  Bovine pericardial patch was sewn with 5-0 Prolene parachute technique to the left common femoral artery.  We de-aired the artery prior to completion.  Checked the foot and patient had a palpable pulse.  Protamine was given for reversal.  The groin was irrigated out with saline until the effluent was clear.  We then closed the groin incision multiple layers of 2-0 Vicryl, 3-0 Vicryl, 4-0 Monocryl and Dermabond.  Taken to recovery in stable condition.  Complication: None  Condition: Stable  Marty Heck, MD [facility] [place] Office: [place]

## 2021-02-12 NOTE — Anesthesia Preprocedure Evaluation (Addendum)
Anesthesia Evaluation  Patient identified by MRN, date of birth, ID band Patient awake    Reviewed: Allergy & Precautions, NPO status , Patient's Chart, lab work & pertinent test results  Airway Mallampati: III  TM Distance: >3 FB Neck ROM: Full    Dental  (+) Teeth Intact, Dental Advisory Given   Pulmonary asthma , sleep apnea , former smoker,    Pulmonary exam normal breath sounds clear to auscultation       Cardiovascular hypertension, + Peripheral Vascular Disease (Stenosis of left common femoral artery)  Normal cardiovascular exam Rhythm:Regular Rate:Normal     Neuro/Psych  Headaches, PSYCHIATRIC DISORDERS Anxiety Depression Bipolar Disorder CVA    GI/Hepatic negative GI ROS, Neg liver ROS,   Endo/Other  diabetes, Type 2, Insulin Dependent, Oral Hypoglycemic AgentsObesity   Renal/GU negative Renal ROS     Musculoskeletal  (+) Arthritis ,   Abdominal   Peds  Hematology negative hematology ROS (+)   Anesthesia Other Findings Day of surgery medications reviewed with the patient.  Reproductive/Obstetrics                            Anesthesia Physical Anesthesia Plan  ASA: 3  Anesthesia Plan: General   Post-op Pain Management: Tylenol PO (pre-op)   Induction: Intravenous  PONV Risk Score and Plan: 3 and Midazolam, Dexamethasone and Ondansetron  Airway Management Planned: Oral ETT  Additional Equipment:   Intra-op Plan:   Post-operative Plan: Extubation in OR  Informed Consent: I have reviewed the patients History and Physical, chart, labs and discussed the procedure including the risks, benefits and alternatives for the proposed anesthesia with the patient or authorized representative who has indicated his/her understanding and acceptance.     Dental advisory given  Plan Discussed with: CRNA  Anesthesia Plan Comments:         Anesthesia Quick Evaluation

## 2021-02-12 NOTE — Transfer of Care (Signed)
Immediate Anesthesia Transfer of Care Note  Patient: Autumn Valenzuela  Procedure(s) Performed: LEFT COMMON FEMORAL ENDARTERECTOMY WITH BOVINE PATCH (Left: Groin)  Patient Location: PACU  Anesthesia Type:General  Level of Consciousness: awake, alert  and oriented  Airway & Oxygen Therapy: Patient Spontanous Breathing and Patient connected to face mask oxygen  Post-op Assessment: Report given to RN, Post -op Vital signs reviewed and stable and Patient moving all extremities  Post vital signs: Reviewed and stable  Last Vitals:  Vitals Value Taken Time  BP 135/91   Temp    Pulse 82 02/12/21 1214  Resp 13 02/12/21 1214  SpO2 100 % 02/12/21 1214  Vitals shown include unvalidated device data.  Last Pain:  Vitals:   02/12/21 0846  TempSrc:   PainSc: 5       Patients Stated Pain Goal: 2 (16/10/96 0454)  Complications: No notable events documented.

## 2021-02-12 NOTE — Anesthesia Postprocedure Evaluation (Signed)
Anesthesia Post Note  Patient: Autumn Valenzuela  Procedure(s) Performed: LEFT COMMON FEMORAL ENDARTERECTOMY WITH BOVINE PATCH (Left: Groin)     Patient location during evaluation: PACU Anesthesia Type: General Level of consciousness: awake and alert Pain management: pain level controlled Vital Signs Assessment: post-procedure vital signs reviewed and stable Respiratory status: spontaneous breathing, nonlabored ventilation, respiratory function stable and patient connected to nasal cannula oxygen Cardiovascular status: blood pressure returned to baseline and stable Postop Assessment: no apparent nausea or vomiting Anesthetic complications: no   No notable events documented.  Last Vitals:  Vitals:   02/12/21 1236 02/12/21 1245  BP:  114/79  Pulse: 80 73  Resp: (!) 21 (!) 21  Temp:    SpO2: 95% 97%    Last Pain:  Vitals:   02/12/21 1236  TempSrc:   PainSc: [place]

## 2021-02-12 NOTE — Progress Notes (Signed)
°  Day of Surgery Note    Subjective:  no complaints   Vitals:   02/12/21 1236 02/12/21 1245  BP:  114/79  Pulse: 80 73  Resp: (!) 21 (!) 21  Temp:    SpO2: 95% 97%    Incisions:   left groin is clean and dry Extremities:  palpable left DP pulse Cardiac:  regular Lungs:  non labored Abdomen:  soft   Assessment/Plan:  This is a 61 y.o. female who is s/p  Left common femoral endarterectomy with bovine pericardial patch angioplasty  -pt with palpable left DP pulse -discussed groin wound care with pt - will keep dry gauze to left groin to wick moisture to help prevent wound infection -to 4 east this afternoon   Leontine Locket, PA-C 02/12/2021 12:56 PM [phone]

## 2021-02-12 NOTE — Progress Notes (Signed)
Pt arrived to 4e from PACU. Pt oriented to room and staff. CHG bath done. Vitals obtained. Left groin incision clean, dry, and intact. Palpable pedal pulses bilaterally. Pt denies needs at this time.

## 2021-02-12 NOTE — H&P (Signed)
History and Physical Interval Note:  02/12/2021 9:49 AM  Autumn Valenzuela  has presented today for surgery, with the diagnosis of Stenosis of common femoral artery.  The various methods of treatment have been discussed with the patient and family. After consideration of risks, benefits and other options for treatment, the patient has consented to  Procedure(s): LEFT COMMON FEMORAL ENDARTERECTOMY (Left) as a surgical intervention.  The patient's history has been reviewed, patient examined, no change in status, stable for surgery.  I have reviewed the patient's chart and labs.  Questions were answered to the patient's satisfaction.    Plan left common femoral endarterectomy for lifestyle limiting claudication.  Marty Heck    Patient name: Autumn Valenzuela         MRN: [id number]        DOB: 1960-02-02          Sex: female   REASON FOR CONSULT: Evaluate PAD and decreased pulse in the left leg   HPI: Autumn Valenzuela is a 61 y.o. female, with history of hypertension, hyperlipidemia, diabetes that presents for evaluation of left leg pain with decreased pulse.  Patient describes pain in the left calf when walking that has been worsening over the last several years.  She used to be able to go to the mall and at least walk around part of the third floor.  Now she is unable to get to her car and has since stopped going to the grocery store and to [employer].  She denies any tobacco abuse.  She is on statin.  Previously was seen by Dr. Donnetta Hutching in 2020 and at that time had normal ABIs with palpable pulses and he did not feel she had significant arterial insufficiency.       Past Medical History:  Diagnosis Date   Allergy     Anemia     Anxiety     Arthritis     Back pain     Bipolar disorder (HCC)     CVA (cerebral infarction)     Depression     Diabetes mellitus without complication (HCC)     High cholesterol     Hypertension     Left leg pain     Migraine     Sleep apnea     Stroke [facility])              Past Surgical History:  Procedure Laterality Date   ABDOMINAL HYSTERECTOMY       ANTERIOR LATERAL LUMBAR FUSION WITH PERCUTANEOUS SCREW 1 LEVEL Left 02/11/2018    Procedure: LEFT LATERAL LUMBAR  FOUR-FIVE INTERBODY FUSION WITH INSTRUMENTATION AND ALLOGRAFT;  Surgeon: Phylliss Bob, MD;  Location: [place];  Service: Orthopedics;  Laterality: Left;  LEFT LATERAL LUMBAR  FOUR-FIVE INTERBODY FUSION WITH INSTRUMENTATION AND ALLOGRAFT   BACK SURGERY        Lumbar fusion L5-S1   DIABETES       EYE SURGERY Bilateral     KNEE SURGERY       l 4-l5 status post lateral posterior fusion January 30,2020       NEUROFORAMINAL STENOSIS Left      Severe L4-L5, involving the level above previous fusion.   PARTIAL HYSTERECTOMY       RADICULOPATHY Left      L4, Secondary to an L4-L5 Spondylolisthesis   SHOULDER SURGERY               Family History  Problem Relation Age of Onset  Diabetes Mother     Stroke Father     Cancer Paternal Aunt     Cancer Paternal Aunt     Diabetes Brother     Colon cancer Neg Hx     Esophageal cancer Neg Hx     Rectal cancer Neg Hx     Stomach cancer Neg Hx        SOCIAL HISTORY: Social History         Socioeconomic History   Marital status: Single      Spouse name: Not on file   Number of children: 3   Years of education: 12   Highest education level: Not on file  Occupational History   Not on file  Tobacco Use   Smoking status: Former      Types: Cigarettes      Quit date: 12/13/2015      Years since quitting: 5.1   Smokeless tobacco: Never  Vaping Use   Vaping Use: Never used  Substance and Sexual Activity   Alcohol use: Yes      Comment: Occas   Drug use: Yes      Types: Marijuana   Sexual activity: Not Currently      Comment: 1st intercourse 61 yo-Fewer than 5 partners  Other Topics Concern   Not on file  Social History Narrative    Patient is single with 3 children.    Patient is right handed.    Patient has a high school  education.    Patient drinks at least 3 cups of caffeine daily.    Social Determinants of Health       Financial Resource Strain: Medium Risk   Difficulty of Paying Living Expenses: Somewhat hard  Food Insecurity: No Food Insecurity   Worried About Charity fundraiser in the Last Year: Never true   Ran Out of Food in the Last Year: Never true  Transportation Needs: No Transportation Needs   Lack of Transportation (Medical): No   Lack of Transportation (Non-Medical): No  Physical Activity: Inactive   Days of Exercise per Week: 0 days   Minutes of Exercise per Session: 0 min  Stress: Stress Concern Present   Feeling of Stress : To some extent  Social Connections: Socially Isolated   Frequency of Communication with Friends and Family: Three times a week   Frequency of Social Gatherings with Friends and Family: More than three times a week   Attends Religious Services: Never   Marine scientist or Organizations: No   Attends Music therapist: Never   Marital Status: Separated  Intimate Partner Violence: Not At Risk   Fear of Current or Ex-Partner: No   Emotionally Abused: No   Physically Abused: No   Sexually Abused: No      No Known Allergies         Current Outpatient Medications  Medication Sig Dispense Refill   Accu-Chek FastClix Lancets MISC TEST 4 TIMES DAILY. DX E11.9 306 each 3   albuterol (PROVENTIL) (2.5 MG/3ML) 0.083% nebulizer solution Take 2.5 mg by nebulization every 6 (six) hours as needed for wheezing.       albuterol (VENTOLIN HFA) 108 (90 Base) MCG/ACT inhaler Inhale 2 puffs into the lungs daily as needed for wheezing or shortness of breath. 18 g 2   Blood Glucose Calibration (ACCU-CHEK GUIDE CONTROL) LIQD 1 each by In Vitro route every 30 (thirty) days. Needs Accu-check guide monitor system. DX. E11.9 (Patient taking differently: 1 each  by In Vitro route every 30 (thirty) days. Needs Accu-check guide monitor system. DX. E11.9 2 times daily) 3  each 3   blood glucose meter kit and supplies Dispense based on patient and insurance preference. Use up to four times daily as directed. (FOR ICD-10 E10.9, E11.9). 1 each 0   calcium-vitamin D (OSCAL WITH D) 500-200 MG-UNIT per tablet Take 1 tablet by mouth once a week.       Cholecalciferol (VITAMIN D3) 1.25 MG (50000 UT) CAPS Take 1 capsule by mouth once a week. 4 capsule 0   Continuous Blood Gluc Sensor (DEXCOM G6 SENSOR) MISC 1 Device by Does not apply route as directed. 9 each 3   Continuous Blood Gluc Transmit (DEXCOM G6 TRANSMITTER) MISC 1 Device by Does not apply route as directed. 1 each 3   diclofenac Sodium (VOLTAREN) 1 % GEL Apply 2 g topically 4 (four) times daily. (Patient taking differently: Apply 2 g topically 4 (four) times daily as needed (pain).) 100 g 1   DULoxetine (CYMBALTA) 60 MG capsule Take 1 capsule (60 mg total) by mouth at bedtime. 90 capsule 3   empagliflozin (JARDIANCE) 25 MG TABS tablet Take 1 tablet (25 mg total) by mouth daily before breakfast. 90 tablet 3   ferrous sulfate 325 (65 FE) MG tablet Take 1 tablet (325 mg total) by mouth every other day. (Patient taking differently: Take 325 mg by mouth once a week.) 45 tablet 3   fluticasone (FLONASE) 50 MCG/ACT nasal spray Place 2 sprays into both nostrils daily as needed for allergies. 16 g 5   gabapentin (NEURONTIN) 300 MG capsule Take 1 capsule (300 mg total) by mouth 2 (two) times daily. 180 capsule 3   insulin aspart (NOVOLOG FLEXPEN) 100 UNIT/ML FlexPen Max daily 30 units (Patient taking differently: Inject 1-4 Units into the skin 3 (three) times daily as needed (blood sugar over 150). Max daily 30 units) 15 mL 6   insulin glargine (LANTUS SOLOSTAR) 100 UNIT/ML Solostar Pen Inject 26 Units into the skin daily. 30 mL 3   Insulin Pen Needle 31G X 5 MM MISC 1 Units by Does not apply route in the morning, at noon, in the evening, and at bedtime. 400 each 1   meloxicam (MOBIC) 15 MG tablet Take 1 tablet (15 mg total) by  mouth daily. With meals 90 tablet 0   metFORMIN (GLUCOPHAGE-XR) 500 MG 24 hr tablet Take 1 tablet (500 mg total) by mouth in the morning and at bedtime. 180 tablet 3   ONETOUCH VERIO test strip CHECK UP TO 4 TIMES A DAY AS DIRECTED 100 strip 2   rosuvastatin (CRESTOR) 20 MG tablet Take 1 tablet (20 mg total) by mouth daily. 90 tablet 3    No current facility-administered medications for this visit.      REVIEW OF SYSTEMS:  _0  denotes positive finding, _1  denotes negative finding Cardiac   Comments:  Chest pain or chest pressure:      Shortness of breath upon exertion:      Short of breath when lying flat:      Irregular heart rhythm:             Vascular      Pain in calf, thigh, or hip brought on by ambulation: x left  Pain in feet at night that wakes you up from your sleep:       Blood clot in your veins:      Leg swelling:  Pulmonary      Oxygen at home:      Productive cough:       Wheezing:              Neurologic      Sudden weakness in arms or legs:       Sudden numbness in arms or legs:       Sudden onset of difficulty speaking or slurred speech:      Temporary loss of vision in one eye:       Problems with dizziness:              Gastrointestinal      Blood in stool:       Vomited blood:              Genitourinary      Burning when urinating:       Blood in urine:             Psychiatric      Major depression:              Hematologic      Bleeding problems:      Problems with blood clotting too easily:             Skin      Rashes or ulcers:             Constitutional      Fever or chills:          PHYSICAL EXAM:    Vitals:    02/06/21 0932  BP: 120/86  Pulse: 79  Resp: 16  Temp: 97.8 F (36.6 C)  TempSrc: Temporal  SpO2: 97%  Weight: 205 lb (93 kg)  Height: _0  (1.549 m)      GENERAL: The patient is a well-nourished female, in no acute distress. The vital signs are documented above. CARDIAC: There is a regular rate and  rhythm.  VASCULAR:  Palpable femoral pulses bilaterally Right DP palpable No palpable left pedal pulses No tissue loss PULMONARY: No respiratory distress ABDOMEN: Soft and non-tender. MUSCULOSKELETAL: There are no major deformities or cyanosis. NEUROLOGIC: No focal weakness or paresthesias are detected. SKIN: There are no ulcers or rashes noted. PSYCHIATRIC: The patient has a normal affect.   DATA:    ABIs 01/22/2021 1.07 on the right biphasic and 0.85 on the left monophasic with a toe pressure of 0 (previous ABI >1 triphasic in 2020)   Assessment/Plan:   61 year old female presents for evaluation of peripheral arterial disease.  Her symptoms are consistent with lifestyle limiting claudication in the left lower extremity.  I discussed claudication is not typically limb threatening situation and we manage this conservatively with exercise and walking therapies as well as statin and aspirin therapy.  She has made an attempt to walk and used to go to the mall on a daily basis.  She's had significant progression of symptoms and now is really unable to walk to the car or go to the grocery store with left calf cramping.  We discussed alternative would be lower extremity arteriogram with possible intervention if she has endovascular options.  She wishes to proceed with intervention as she feels that she is really unable to do her activities of daily living and care for her grandchildren. Discussed may need open surgical bypass.  We will get her scheduled in the Cath Lab this week.  Risk benefits discussed.     Marty Heck, MD [facility] [place] Office: [phone]

## 2021-02-12 NOTE — Anesthesia Procedure Notes (Addendum)
Procedure Name: Intubation Date/Time: 02/12/2021 10:29 AM Performed by: Barrington Ellison, CRNA Pre-anesthesia Checklist: Patient identified, Emergency Drugs available, Suction available and Patient being monitored Patient Re-evaluated:Patient Re-evaluated prior to induction Oxygen Delivery Method: Circle System Utilized Preoxygenation: Pre-oxygenation with 100% oxygen Induction Type: IV induction Ventilation: Mask ventilation without difficulty Laryngoscope Size: Mac and 3 Grade View: Grade I Tube type: Oral Tube size: 7.0 mm Number of attempts: 1 Airway Equipment and Method: Stylet and Oral airway Placement Confirmation: ETT inserted through vocal cords under direct vision, positive ETCO2 and breath sounds checked- equal and bilateral Secured at: 22 cm Tube secured with: Tape Dental Injury: Teeth and Oropharynx as per pre-operative assessment  Comments: Placed by Lucita Ferrara, SRNA under supervision of MDA and CRNA. Small chip noted on right front tooth prior to DL

## 2021-02-13 ENCOUNTER — Encounter (HOSPITAL_COMMUNITY): Payer: Self-pay | Admitting: Vascular Surgery

## 2021-02-13 LAB — BASIC METABOLIC PANEL
Anion gap: 9 (ref 5–15)
BUN: 8 mg/dL (ref 6–20)
CO2: 23 mmol/L (ref 22–32)
Calcium: 8.6 mg/dL — ABNORMAL LOW (ref 8.9–10.3)
Chloride: 107 mmol/L (ref 98–111)
Creatinine, Ser: 0.65 mg/dL (ref 0.44–1.00)
GFR, Estimated: >60 mL/min (ref >60)
Glucose, Bld: 148 mg/dL — ABNORMAL HIGH (ref 70–99)
Potassium: 3.7 mmol/L (ref 3.5–5.1)
Sodium: 139 mmol/L (ref 135–145)

## 2021-02-13 LAB — LIPID PANEL
Cholesterol: 175 mg/dL (ref 0–200)
HDL: 37 mg/dL — ABNORMAL LOW (ref >40)
LDL Cholesterol: 123 mg/dL — ABNORMAL HIGH (ref 0–99)
Total CHOL/HDL Ratio: 4.7 RATIO
Triglycerides: 75 mg/dL (ref <150)
VLDL: 15 mg/dL (ref 0–40)

## 2021-02-13 LAB — CBC
HCT: 32.4 % — ABNORMAL LOW (ref 36.0–46.0)
Hemoglobin: 10.5 g/dL — ABNORMAL LOW (ref 12.0–15.0)
MCH: 27.7 pg (ref 26.0–34.0)
MCHC: 32.4 g/dL (ref 30.0–36.0)
MCV: 85.5 fL (ref 80.0–100.0)
Platelets: 197 10*3/uL (ref 150–400)
RBC: 3.79 MIL/uL — ABNORMAL LOW (ref 3.87–5.11)
RDW: 15.8 % — ABNORMAL HIGH (ref 11.5–15.5)
WBC: 12.7 10*3/uL — ABNORMAL HIGH (ref 4.0–10.5)
nRBC: 0 % (ref 0.0–0.2)

## 2021-02-13 LAB — GLUCOSE, CAPILLARY: Glucose-Capillary: 157 mg/dL — ABNORMAL HIGH (ref 70–99)

## 2021-02-13 MED ORDER — HYDROCODONE-ACETAMINOPHEN 5-325 MG PO TABS: 1.0000 | ORAL_TABLET | Freq: Four times a day (QID) | ORAL | 0 refills | Status: DC | PRN

## 2021-02-13 MED ORDER — ASPIRIN 81 MG PO TBEC: 81.0000 mg | DELAYED_RELEASE_TABLET | Freq: Every day | ORAL | 11 refills | Status: AC

## 2021-02-13 NOTE — Progress Notes (Signed)
[facility] [place]  Subjective  -states her calf claudication has resolved.  She has ambulated.  Wants to go home today.   Objective 104/63 80 98.3 F (36.8 C) (Oral) 20 97%  Intake/Output Summary (Last 24 hours) at 02/13/2021 0703 Last data filed at 02/13/2021 0600 Gross per 24 hour  Intake 2013.69 ml  Output 1130 ml  Net 883.69 ml    Left groin incision clean dry and intact with no hematoma Left DP palpable  Laboratory Lab Results: Recent Labs    02/12/21 0821 02/13/21 0250  WBC 11.8* 12.7*  HGB 11.4* 10.5*  HCT 37.0 32.4*  PLT 238 197   BMET Recent Labs    02/12/21 0821 02/13/21 0250  NA 138 139  K 4.2 3.7  CL 105 107  CO2 24 23  GLUCOSE 271* 148*  BUN 11 8  CREATININE 0.66 0.65  CALCIUM 9.2 8.6*    COAG Lab Results  Component Value Date   INR 1.0 02/12/2021   INR 0.99 02/09/2018   INR 1.02 12/29/2010   No results found for: PTT  Assessment/Planning:  Postop day 1 status post left common femoral endarterectomy with bovine patch for lifestyle limiting claudication with high-grade left common femoral stenosis.  Left groin looks good and she has a palpable pedal pulse.  Claudication symptoms resolved.  She wants to go home today.  Discussed aspirin statin at discharge.  We will arrange follow-up in 2 to 3 weeks for wound check.  Marty Heck 02/13/2021 7:03 AM --

## 2021-02-13 NOTE — Discharge Summary (Addendum)
Bypass Discharge Summary Patient ID: Autumn Valenzuela [id number] y.o. 1960-05-21  Admit date: 02/12/2021  Discharge date and time: 02/13/2021 11:30 AM   Admitting Physician: Marty Heck, MD   Discharge Physician: Monica Martinez, MD  Admission Diagnoses: PAD (peripheral artery disease) [facility]) [I73.9]  Discharge Diagnoses: PAD  Admission Condition: fair  Discharged Condition: good  Indication for Admission: 61 year old female seen with worsening lifestyle limiting claudication left lower extremity with monophasic waveforms at the ankle.  Angiogram confirmed high-grade left common femoral stenosis with a focal plaque.  She presents today for left common femoral endarterectomy after risk benefits discussed.  An assistant was needed for exposure and to expedite the case.   Hospital Course: Patient was admitted on 02/12/21 and underwent left common femoral endarterectomy with bovine pericardial patch angioplasty by Dr. Carlis Abbott.  She tolerated the procedure well and was taken to the recovery room in stable condition.   By POD#1, claudication pain resolved. Left groin incisions well appearing, intact, clean and dry. Pain overall well controlled. Tolerating ambulation with walker. Tolerating diet. Hemodynamically stable. Remained stable for discharge home. PDMP reviewed. Patient given #8 hydrocodone- Acetaminophen 0 refills. Prescription also sent to her pharmacy for Aspirin 81 mg. She will continue statin as well as her other home medications. She has follow up arranged in our office in 2-3 weeks for incision check.   Consults: None  Treatments: analgesia: oxycodone-Acetaminophen 5-325 mg and surgery: left common femoral endarterectomy with bovine pericardial patch angioplasty   Disposition: Discharge disposition: 01-Home or Self Care       - For VQI Registry use ---  Post-op:  Wound infection: No  Graft infection: No  Transfusion: No  If yes, 0 units given New  Arrhythmia: No Patency judged by: '[ ]'  Dopper only, '[ ]'  Palpable graft pulse, '[ ]'  Palpable distal pulse, '[ ]'  ABI inc. > 0.15, '[ ]'  Duplex D/C Ambulatory Status: Ambulatory with Assistance  Complications: MI: [ X] No, '[ ]'  Troponin only, '[ ]'  EKG or Clinical CHF: No Resp failure: [ X] none, '[ ]'  Pneumonia, '[ ]'  Ventilator Chg in renal function: Valu.Nieves ] none, '[ ]'  Inc. Cr > 0.5, '[ ]'  Temp. Dialysis, '[ ]'  Permanent dialysis Stroke: Valu.Nieves ] None, '[ ]'  Minor, '[ ]'  Major Return to OR: No  Reason for return to OR: '[ ]'  Bleeding, '[ ]'  Infection, '[ ]'  Thrombosis, '[ ]'  Revision  Discharge medications: Statin use:  Yes ASA use:  Yes Plavix use:  No  for medical reason not indicated Beta blocker use: No  for medical reason not indicated Coumadin use: No  for medical reason not indicated    Patient Instructions:  Allergies as of 02/13/2021   No Known Allergies      Medication List     TAKE these medications    Accu-Chek FastClix Lancets Misc TEST 4 TIMES DAILY. DX E11.9   Accu-Chek Guide Control Liqd 1 each by In Vitro route every 30 (thirty) days. Needs Accu-check guide monitor system. DX. E11.9 What changed: additional instructions   albuterol 108 (90 Base) MCG/ACT inhaler Commonly known as: VENTOLIN HFA Inhale 2 puffs into the lungs daily as needed for wheezing or shortness of breath.   albuterol (2.5 MG/3ML) 0.083% nebulizer solution Commonly known as: PROVENTIL Take 2.5 mg by nebulization every 6 (six) hours as needed for wheezing.   aspirin 81 MG EC tablet Take 1 tablet (81 mg total) by mouth daily at 6 (six) AM. Swallow whole.   Basaglar KwikPen  100 UNIT/ML Inject 26 Units into the skin at bedtime.   blood glucose meter kit and supplies Dispense based on patient and insurance preference. Use up to four times daily as directed. (FOR ICD-10 E10.9, E11.9).   calcium-vitamin D 500-200 MG-UNIT tablet Commonly known as: OSCAL WITH D Take 1 tablet by mouth daily.   Dexcom G6 Sensor Misc 1  Device by Does not apply route as directed.   Dexcom G6 Transmitter Misc 1 Device by Does not apply route as directed.   diclofenac Sodium 1 % Gel Commonly known as: Voltaren Apply 2 g topically 4 (four) times daily. What changed:  when to take this reasons to take this   DULoxetine 60 MG capsule Commonly known as: CYMBALTA Take 1 capsule (60 mg total) by mouth at bedtime.   empagliflozin 25 MG Tabs tablet Commonly known as: Jardiance Take 1 tablet (25 mg total) by mouth daily before breakfast.   ferrous sulfate 325 (65 FE) MG tablet Take 1 tablet (325 mg total) by mouth every other day. What changed: when to take this   fluticasone 50 MCG/ACT nasal spray Commonly known as: FLONASE Place 2 sprays into both nostrils daily as needed for allergies.   gabapentin 300 MG capsule Commonly known as: NEURONTIN Take 1 capsule (300 mg total) by mouth 2 (two) times daily.   HYDROcodone-acetaminophen 5-325 MG tablet Commonly known as: NORCO/VICODIN Take 1 tablet by mouth every 6 (six) hours as needed for moderate pain.   Insulin Pen Needle 31G X 5 MM Misc 1 Units by Does not apply route in the morning, at noon, in the evening, and at bedtime.   meloxicam 15 MG tablet Commonly known as: Mobic Take 1 tablet (15 mg total) by mouth daily. With meals   metFORMIN 500 MG 24 hr tablet Commonly known as: GLUCOPHAGE-XR Take 1 tablet (500 mg total) by mouth in the morning and at bedtime.   NovoLOG FlexPen 100 UNIT/ML FlexPen Generic drug: insulin aspart Max daily 30 units What changed:  how much to take how to take this when to take this reasons to take this   OneTouch Verio test strip Generic drug: glucose blood CHECK UP TO 4 TIMES A DAY AS DIRECTED   rosuvastatin 20 MG tablet Commonly known as: Crestor Take 1 tablet (20 mg total) by mouth daily. What changed: when to take this Notes to patient: Take daily to reduce progression of vascular disease   traMADol 50 MG  tablet Commonly known as: ULTRAM Take 50 mg by mouth every 6 (six) hours as needed for moderate pain.   Vitamin D3 1.25 MG (50000 UT) Caps Take 1 capsule by mouth once a week.               Discharge Care Instructions  (From admission, onward)           Start     Ordered   02/13/21 0000  Discharge wound care:       Comments: Keep groin incision clean and dry. Wash with mild soap and water, pat dry. Do not soak in bathtub   02/13/21 0721           Activity: activity as tolerated, no driving while on analgesics, and no heavy lifting for 6 weeks Diet: diabetic diet Wound Care: keep wound clean and dry and wash with mild soap and water, pat dry. Do not soak in bathtub  Follow-up with Dr. Carlis Abbott in  2-3   weeks.  Signed: Karoline Caldwell 02/14/2021  1:01 PM

## 2021-02-13 NOTE — Progress Notes (Signed)
PT Cancellation Note  Patient Details Name: BAELYN DORING MRN: [id number] DOB: 05-03-1960   Cancelled Treatment:    Reason Eval/Treat Not Completed: PT screened, no needs identified, will sign off. Patient ambulated on unit and on steps with OT this am. No mobility needs identified. Completing order at this time.    Chelesa Weingartner 02/13/2021, 9:35 AM

## 2021-02-13 NOTE — Evaluation (Signed)
Occupational Therapy Evaluation Patient Details Name: Autumn Valenzuela MRN: [id number] DOB: 05-06-1960 Today's Date: 02/13/2021   History of Present Illness 61 yo s/p L common femoral endarterectomy with bovine pericardial patch angioplasty PMH: anxiety bipolar arthritis CVA depression DM Sleep apnea Shoulder surgery, back L5-s1   Clinical Impression   Patient evaluated by Occupational Therapy with no further acute OT needs identified. All education has been completed and the patient has no further questions. See below for any follow-up Occupational Therapy or equipment needs. OT to sign off. Thank you for referral.        Recommendations for follow up therapy are one component of a multi-disciplinary discharge planning process, led by the attending physician.  Recommendations may be updated based on patient status, additional functional criteria and insurance authorization.   Follow Up Recommendations  No OT follow up    Assistance Recommended at Discharge None  Patient can return home with the following      Functional Status Assessment  Patient has had a recent decline in their functional status and demonstrates the ability to make significant improvements in function in a reasonable and predictable amount of time.  Equipment Recommendations       Recommendations for Other Services       Precautions / Restrictions Precautions Precautions: None      Mobility Bed Mobility               General bed mobility comments: oob on arrival    Transfers Overall transfer level: Independent                        Balance                                           ADL either performed or assessed with clinical judgement   ADL Overall ADL's : Modified independent Eating/Feeding: Independent   Grooming: Wash/dry hands   Upper Body Bathing: Independent               Toilet Transfer: Independent           Functional mobility during  ADLs: Independent General ADL Comments: does not require RW but has it in the room. pt dressed on arrival. discussed positioning for dressing , tub transfer. plans to use a chair in bath tub. pt has AE from previous admission.     Vision Baseline Vision/History: 1 Wears glasses (just for reading)       Perception     Praxis      Pertinent Vitals/Pain Pain Assessment Pain Assessment: No/denies pain     Hand Dominance Right   Extremity/Trunk Assessment Upper Extremity Assessment Upper Extremity Assessment: Overall WFL for tasks assessed   Lower Extremity Assessment Lower Extremity Assessment: Overall WFL for tasks assessed   Cervical / Trunk Assessment Cervical / Trunk Assessment: Back Surgery   Communication Communication Communication: No difficulties   Cognition Arousal/Alertness: Awake/alert Behavior During Therapy: WFL for tasks assessed/performed Overall Cognitive Status: Within Functional Limits for tasks assessed                                       General Comments  educated on clean wash cloth for each and every shower.    Exercises     Shoulder  Instructions      Home Living Family/patient expects to be discharged to:: Private residence Living Arrangements: Alone   Type of Home: House Home Access: Stairs to enter [employer] of Steps: 5 Entrance Stairs-Rails: Right;Left;Can reach both Home Layout: One level     Bathroom Shower/Tub: Tub/shower unit;Curtain   Biochemist, clinical: [place]: Conservation officer, nature (2 wheels);Cane - single point;Adaptive equipment;Grab bars - tub/shower Adaptive Equipment: Long-handled shoe horn;Sock aid;Reacher;Long-handled sponge        Prior Functioning/Environment Prior Level of Function : Independent/Modified Independent                        OT Problem List:        OT Treatment/Interventions:      OT Goals(Current goals can be found in the care plan  section) Acute Rehab OT Goals Patient Stated Goal: to go home today Potential to Achieve Goals: Good  OT Frequency:      Co-evaluation              AM-PAC OT "6 Clicks" Daily Activity     Outcome Measure Help from another person eating meals?: None Help from another person taking care of personal grooming?: None Help from another person toileting, which includes using toliet, bedpan, or urinal?: None Help from another person bathing (including washing, rinsing, drying)?: None Help from another person to put on and taking off regular upper body clothing?: None Help from another person to put on and taking off regular lower body clothing?: None 6 Click Score: 24   End of Session Nurse Communication: Mobility status;Precautions  Activity Tolerance: Patient tolerated treatment well Patient left: in chair;with call bell/phone within reach  OT Visit Diagnosis: Unsteadiness on feet (R26.81)                Time: 8657-8469 OT Time Calculation (min): 33 min Charges:  OT General Charges $OT Visit: 1 Visit OT Evaluation $OT Eval Moderate Complexity: 1 Mod   Brynn, OTR/L  Acute Rehabilitation Services Pager: [phone] Office: [phone] .   Jeri Modena 02/13/2021, 9:31 AM

## 2021-02-13 NOTE — Progress Notes (Signed)
Patient discharging home. Tele removed and CCMD notified. IV removed without complications. Discharge instructions and medication administration discussed. All questions answered. Volunteers called to take patient down.  [place] N Jordana Dugue

## 2021-02-15 DIAGNOSIS — M25552 Pain in left hip: Secondary | ICD-10-CM | POA: Diagnosis not present

## 2021-02-15 DIAGNOSIS — M1612 Unilateral primary osteoarthritis, left hip: Secondary | ICD-10-CM | POA: Diagnosis not present

## 2021-02-27 DIAGNOSIS — G5601 Carpal tunnel syndrome, right upper limb: Secondary | ICD-10-CM | POA: Diagnosis not present

## 2021-02-27 NOTE — Progress Notes (Signed)
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 Coronal and sagittal reconstructions were obtained of the axial image sets.  COMPARISON:  Lumbar spine MRI 07/10/2018  FINDINGS: LUMBAR MYELOGRAM FINDINGS:  There are 5 non rib-bearing lumbar type vertebrae. Vertebral alignment is normal, and no abnormal motion is evident on flexion or extension radiographs. Prior fusion is noted at L4-5 and L5-S1. There is some distortion of the ventral and right aspects of the thecal sac at L5-S1, however no significant spinal stenosis is evident at any level.  CT LUMBAR MYELOGRAM FINDINGS:  Vertebral alignment is normal. No fracture or suspicious osseous lesion is evident. Sequelae of posterior and interbody fusion are again identified at L4-5 and L5-S1. Pedicle screws remain in place bilaterally at L4 and L5 without evidence of loosening. Old screw tracks are noted at S1. There is solid interbody and posterolateral osseous fusion at L5-S1, however solid fusion is not evident at L4-5. The conus medullaris terminates at L1. A small  amount of ventral epidural contrast from L2-L4 and a small amount of dorsal subdural contrast at L1-2 are attributed to the injection. There is abdominal aortic atherosclerosis without aneurysm.  T12-L1: Negative.  L1-2: Unchanged tiny central disc protrusion and minimal facet arthrosis without stenosis.  L2-3: Mild facet arthrosis without disc herniation or stenosis.  L3-4: Minimal disc bulging, mild ligamentum flavum hypertrophy, and moderate right and mild left facet arthrosis without stenosis, unchanged.  L4-5: Prior posterior decompression  and fusion.  No stenosis.  L5-S1: Prior posterior decompression and fusion.  No stenosis.  IMPRESSION: 1. Prior posterior decompression and fusion at L4-5 and L5-S1 without residual stenosis. No solid osseous fusion at L4-5. 2. Mild spondylosis and up to moderate facet arthrosis elsewhere without stenosis. 3. Aortic Atherosclerosis (ICD10-I70.0).   Electronically Signed By: Logan Bores M.D. On: 10/11/2020 15:36  Lumbar DG 2-3 views: Results for orders placed during the hospital encounter of 12/01/20 DG Lumbar Spine 2-3 Views  Narrative CLINICAL DATA:  L4-L5 fusion  EXAM: LUMBAR SPINE - 2-3 VIEW  COMPARISON:  Lumbar spine radiographs 09/14/2020, CT lumbar spine 10/11/2020  FINDINGS: Two C-arm fluoroscopic images were obtained intraoperatively and submitted for post operative interpretation. Bilateral pedicle screws are seen at L4 and L5 with evidence of posterior decompression. The intervening rods have been removed. The spacer appears in stable position. Fluoro time 8.9 seconds. Please see the performing provider's procedural report for further detail.  IMPRESSION: Bilateral pedicle screws at L4-L5. The intervening rods have been removed at the time of image acquisition.   Electronically Signed By: Valetta Mole M.D. On: 12/01/2020 10:55  Lumbar DG (Complete) 4+V: Results for orders placed during the hospital  encounter of 06/22/18 DG Lumbar Spine Complete  Narrative CLINICAL DATA:  Back pain status post fall.  EXAM: LUMBAR SPINE - COMPLETE 4+ VIEW  COMPARISON:  03/23/2018  FINDINGS: There is no evidence of lumbar spine fracture. Alignment is normal. Posterior lumbar fusion at L5-S1 without hardware failure or complication. Disc spaces are maintained.  IMPRESSION: No acute osseous injury of the lumbar spine.   Electronically Signed By: Kathreen Devoid On: 06/23/2018 08:29  Lumbar DG Myelogram views: Results for orders placed during the hospital encounter of 06/29/07 DG Myelogram Lumbar  Narrative MYELOGRAM INJECTION  Technique:  Informed consent was obtained from the patient prior to the procedure, including potential complications of headache, allergy, infection and pain.  A timeout procedure was performed. With the patient prone, the lower back was prepped with Betadine. 1% Lidocaine was used for local anesthesia.  Lumbar puncture was performed at the L3level using a 22 gauge needle with return of clear CSF.  15cc of Omnipaque 180was injected into the subarachnoid space .  IMPRESSION: Successful injection of  intrathecal contrast for myelography.   Clinical Data:  The patient undergone previous lumbar effusion. Patient has developed weakness  Fluoroscopy Time: 1.43 minutes.  Comparison: Preoperative MRI 10/05/2006  Findings: Postsurgical changes secondary to pedicle screw plate fixation E6-L5 noted.  Interbody spacers are seen at the 05/01 level.  The hardware is intact.  The alignment is stable through flexion and extension.  No appreciable central or foraminal stenosis.  Normal filling of the nerve roots.  IMPRESSION: L5-S1 fusion is intact and stable through flexion and extension. No appreciable central or foraminal stenosis.  CT to follow.   Clinical Data:  Low back pain with the interval development right leg weakness  CT MYELOGRAPHY LUMBAR  SPINE  Technique: CT imaging of the lumbar spine was performed after intrathecal contrast administration.  Multiplanar CT image reconstructions were also generated.  Comparison: Preoperative MRI 10/05/2006  Findings:  The reformat images show anatomic alignment.  Pedicle screws are well positioned.  There is extensive perineural fibrosis.  No central stenosis.  Conus terminates at the L1 level and has a normal appearance.  Axial imaging:  L1-L2:  Normal  L2-L3:  Normal  L3-L4:  Normal  L4-L5:  Mild facet degenerative changes bilaterally.  No central  or foraminal stenosis.  L5-S1: Posterior displacement of the right S1 root is likely related to scar.  There is no mass effect appreciated on the root or on the thecal sac.  The root fills normally on the myelogram. No appreciable foraminal encroachment.  IMPRESSION: Posterior displacement of the right S1 root is identified which likely related to scar.  The fusion hardware is well positioned and intact.  No central nor foraminal stenosis.  Provider: Ceasar Lund  Lumbar DG Myelogram Lumbosacral: Results for orders placed during the hospital encounter of 10/11/20 DG MYELOGRAPHY LUMBAR INJ LUMBOSACRAL  Narrative CLINICAL DATA:  Lumbago of lumbar region with sciatica. Low back pain radiating into the buttocks and legs, worse on the left.  EXAM: LUMBAR MYELOGRAM  FLUOROSCOPY TIME:  Fluoroscopy Time: 55 seconds  Radiation Exposure Index: 386.52 microGray*m^2  PROCEDURE: After thorough discussion of risks and benefits of the procedure including bleeding, infection, injury to nerves, blood vessels, adjacent structures as well as headache and CSF leak, written and oral informed consent was obtained. Consent was obtained by Dr. Logan Bores. Time out form was completed.  Patient was positioned prone on the fluoroscopy table. Local anesthesia was provided with 1% lidocaine without epinephrine after prepped and draped in the  usual sterile fashion. Puncture was performed at L2-3 using a 3 1/2 inch 22-gauge spinal needle via a right interlaminar approach. Using a single pass through the dura, the needle was placed within the thecal sac, with return of clear CSF. 15 mL of Isovue M-200 was injected into the thecal sac, with normal opacification of the nerve roots and cauda equina consistent with free flow within the subarachnoid space.  I personally performed the lumbar puncture and administered the intrathecal contrast. I also personally supervised acquisition of the myelogram images.  TECHNIQUE: Contiguous axial images were obtained through the Lumbar spine after the intrathecal infusion of contrast. Coronal and sagittal reconstructions were obtained of the axial image sets.  COMPARISON:  Lumbar spine MRI 07/10/2018  FINDINGS: LUMBAR MYELOGRAM FINDINGS:  There are 5 non rib-bearing lumbar type vertebrae. Vertebral alignment is normal, and no abnormal motion is evident on flexion or extension radiographs. Prior fusion is noted at L4-5 and L5-S1. There is some distortion of the ventral and right aspects of the thecal sac at L5-S1, however no significant spinal stenosis is evident at any level.  CT LUMBAR MYELOGRAM FINDINGS:  Vertebral alignment is normal. No fracture or suspicious osseous lesion is evident. Sequelae of posterior and interbody fusion are again identified at L4-5 and L5-S1. Pedicle screws remain in place bilaterally at L4 and L5 without evidence of loosening. Old screw tracks are noted at S1. There is solid interbody and posterolateral osseous fusion at L5-S1, however solid fusion is not evident at L4-5. The conus medullaris terminates at L1. A small amount of ventral epidural contrast from L2-L4 and a small amount of dorsal subdural contrast at L1-2 are attributed to the injection. There is abdominal aortic atherosclerosis without aneurysm.  T12-L1: Negative.  L1-2: Unchanged tiny  central disc protrusion and minimal facet arthrosis without stenosis.  L2-3: Mild facet arthrosis without disc herniation or stenosis.  L3-4: Minimal disc bulging, mild ligamentum flavum hypertrophy, and moderate right and mild left facet arthrosis without stenosis, unchanged.  L4-5: Prior posterior decompression and fusion.  No stenosis.  L5-S1: Prior posterior decompression and fusion.  No stenosis.  IMPRESSION: 1. Prior posterior decompression and fusion at L4-5 and L5-S1 without residual stenosis. No solid osseous fusion at L4-5. 2. Mild  spondylosis and up to moderate facet arthrosis elsewhere without stenosis. 3. Aortic Atherosclerosis (ICD10-I70.0).   Electronically Signed By: Logan Bores M.D. On: 10/11/2020 15:36  Spine Imaging: Spine Outside MR Films: Results for orders placed in visit on 09/15/18 MR OUTSIDE FILMS SPINE  Hip Imaging: Hip-R DG 2-3 views: Results for orders placed in visit on 06/26/16 DG HIP UNILAT W OR W/O PELVIS 2-3 VIEWS RIGHT  Narrative CLINICAL DATA:  Right hip pain, no known injury, initial encounter  EXAM: DG HIP (WITH OR WITHOUT PELVIS) 2-3V RIGHT  COMPARISON:  07/24/2015  FINDINGS: Pelvic ring is intact. Postsurgical changes at the lumbosacral junction are noted. Mild degenerative changes of the hip joints are noted bilaterally. No acute fracture or dislocation is seen. No soft tissue abnormality is noted.  IMPRESSION: No acute abnormality noted.   Electronically Signed By: Inez Catalina M.D. On: 06/26/2016 10:29  Knee Imaging: Knee-L DG 1-2 views: Results for orders placed during the hospital encounter of 07/05/05 DG Knee 2 Views Left  Narrative Clinical Data: Knee pain. LEFT KNEE - 2 VIEWS: Findings: There is some joint space loss and osteophytosis of the medial compartment. No fracture, dislocation, or joint effusion. IMPRESSION:  Degenerative changes in the medial compartment.  Provider: Ezzard Standing  Knee-R  DG 4 views: Results for orders placed during the hospital encounter of 08/18/12 DG Knee Complete 4 Views Right  Narrative *RADIOLOGY REPORT*  Clinical Data: Right knee pain for 2 days.  No known injury.  RIGHT KNEE - COMPLETE 4+ VIEW  Comparison: 08/29/2008.  Findings: No significant joint space narrowing.  No fracture or dislocation.  IMPRESSION: No acute bony abnormality   Original Report Authenticated By: Genia Del, M.D.  Knee-L DG 4 views: Results for orders placed during the hospital encounter of 10/13/17 DG Knee Complete 4 Views Left  Narrative CLINICAL DATA:  LEFT knee pain.  History of TKR many years ago.  EXAM: LEFT KNEE - COMPLETE 4+ VIEW  COMPARISON:  01/14/2007  FINDINGS: LEFT total knee arthroplasty. There is normal alignment. No acute fracture or joint effusion.  Along the anterior aspect of the tibial component, there is thin linear lucency which raises question of early loosening or infection. Incidental imaging of the RIGHT knee is unremarkable.  IMPRESSION: 1. No evidence for acute fracture. 2. Question of early loosening/infection of the tibial component.   Electronically Signed By: Nolon Nations M.D. On: 10/13/2017 14:24  Foot Imaging: Foot-R DG Complete: Results for orders placed in visit on 04/11/20 DG Foot Complete Right  Narrative Please see detailed radiograph report in office note.  Foot-L DG Complete: Results for orders placed in visit on 04/11/20 DG Foot Complete Left  Narrative Please see detailed radiograph report in office note.  Elbow Imaging: Elbow-L DG Complete: Results for orders placed during the hospital encounter of 10/13/12 DG Elbow Complete Left  Narrative CLINICAL DATA:  Elbow pain post injury  EXAM: LEFT ELBOW - COMPLETE 3+ VIEW  COMPARISON:  None.  FINDINGS: Four views of left elbow submitted. No acute fracture or subluxation. No radiopaque foreign body. Mild spurring of radial head. Probable  small joint effusion. Minimal spurring of proximal ulna.  IMPRESSION: No acute fracture or subluxation. Probable small joint effusion. Mild degenerative changes as described above.   Electronically Signed By: Lahoma Crocker On: 10/13/2012 12:54  Hand Imaging: Hand-R DG Complete: Results for orders placed during the hospital encounter of 07/09/11 DG Hand Complete Right  Narrative *RADIOLOGY REPORT*  Clinical Data: Right-sided hand pain,  no injury  RIGHT HAND - COMPLETE 3+ VIEW  Comparison: 08/29/2008  Findings: No fracture or dislocation.  Joint spaces are preserved. No definite evidence of chondrocalcinosis.  Regional soft tissues are normal.  IMPRESSION: No explanation for patient's persistent hand pain.  Original Report Authenticated By: Rachel Moulds, M.D.  Hand-L DG Complete: Results for orders placed during the hospital encounter of 08/29/08 DG Hand Complete Left  Narrative Clinical Data: [place].  LEFT HAND - COMPLETE 3+ VIEW  Comparison: None  Findings: The joint spaces are maintained.  No fractures are seen. There are cystic type degenerative changes are noted involving the lunate and capitate bones.  IMPRESSION: No acute bony findings.  Provider: Rema Fendt, Lendon Collar  Complexity Note: Imaging results reviewed. Results shared with Autumn Valenzuela, using Layman's terms.                        ROS  Cardiovascular: Daily Aspirin intake and High blood pressure Pulmonary or Respiratory: Wheezing and difficulty taking a deep full breath (Asthma) Neurological: Stroke (Residual deficits or weakness: unnknown) Psychological-Psychiatric: Anxiousness and Depressed Gastrointestinal: No reported gastrointestinal signs or symptoms such as vomiting or evacuating blood, reflux, heartburn, alternating episodes of diarrhea and constipation, inflamed or scarred liver, or pancreas or irrregular and/or infrequent bowel movements Genitourinary: No reported renal or  genitourinary signs or symptoms such as difficulty voiding or producing urine, peeing blood, non-functioning kidney, kidney stones, difficulty emptying the bladder, difficulty controlling the flow of urine, or chronic kidney disease Hematological: No reported hematological signs or symptoms such as prolonged bleeding, low or poor functioning platelets, bruising or bleeding easily, hereditary bleeding problems, low energy levels due to low hemoglobin or being anemic Endocrine: High blood sugar controlled without the use of insulin (NIDDM) Rheumatologic: No reported rheumatological signs and symptoms such as fatigue, joint pain, tenderness, swelling, redness, heat, stiffness, decreased range of motion, with or without associated rash Musculoskeletal: Negative for myasthenia gravis, muscular dystrophy, multiple sclerosis or malignant hyperthermia Work History: Disabled  Allergies  Autumn Valenzuela has No Known Allergies.  Laboratory Chemistry Profile   Renal Lab Results  Component Value Date   BUN 8 02/13/2021   CREATININE 0.65 02/13/2021   GFR 95.70 01/17/2021   GFRAA >60 02/09/2018   GFRNONAA >60 02/13/2021   SPECGRAV 1.025 02/26/2019   PHUR 6.0 02/26/2019   PROTEINUR NEGATIVE 02/12/2021     Electrolytes Lab Results  Component Value Date   NA 139 02/13/2021   K 3.7 02/13/2021   CL 107 02/13/2021   CALCIUM 8.6 (L) 02/13/2021   MG 1.9 10/07/2018     Hepatic Lab Results  Component Value Date   AST 10 (L) 02/12/2021   ALT 10 02/12/2021   ALBUMIN 3.4 (L) 02/12/2021   ALKPHOS 57 02/12/2021   LIPASE 58 11/11/2013     ID Lab Results  Component Value Date   HIV Non Reactive 03/24/2016   SARSCOV2NAA NEGATIVE 02/08/2021   STAPHAUREUS NEGATIVE 02/12/2021   MRSAPCR NEGATIVE 02/12/2021     Bone Lab Results  Component Value Date   VD25OH 31.23 01/17/2021     Endocrine Lab Results  Component Value Date   GLUCOSE 148 (H) 02/13/2021   GLUCOSEU 100 (A) 02/12/2021   HGBA1C 8.7 (H)  02/12/2021   TSH 0.51 02/04/2019   FREET4 1.08 03/24/2016     Neuropathy Lab Results  Component Value Date   VITAMINB12 1,044 (H) 08/18/2017   HGBA1C 8.7 (H) 02/12/2021   HIV  Non Reactive 03/24/2016     CNS No results found for: COLORCSF, APPEARCSF, RBCCOUNTCSF, WBCCSF, POLYSCSF, LYMPHSCSF, EOSCSF, PROTEINCSF, GLUCCSF, JCVIRUS, CSFOLI, IGGCSF, LABACHR, ACETBL, LABACHR, ACETBL   Inflammation (CRP: Acute   ESR: Chronic) Lab Results  Component Value Date   CRP 14.6 (H) 07/21/2020   ESRSEDRATE 34 (H) 07/21/2020     Rheumatology Lab Results  Component Value Date   RF <14 08/21/2018   ANA Negative 03/24/2016     Coagulation Lab Results  Component Value Date   INR 1.0 02/12/2021   LABPROT 13.0 02/12/2021   APTT 26 02/12/2021   PLT 197 02/13/2021     Cardiovascular Lab Results  Component Value Date   CKTOTAL 79 09/05/2020   TROPONINI <0.30 11/29/2013   HGB 10.5 (L) 02/13/2021   HCT 32.4 (L) 02/13/2021     Screening Lab Results  Component Value Date   SARSCOV2NAA NEGATIVE 02/08/2021   STAPHAUREUS NEGATIVE 02/12/2021   MRSAPCR NEGATIVE 02/12/2021   HIV Non Reactive 03/24/2016     Cancer No results found for: CEA, CA125, LABCA2   Allergens No results found for: ALMOND, APPLE, ASPARAGUS, AVOCADO, BANANA, BARLEY, BASIL, BAYLEAF, GREENBEAN, LIMABEAN, WHITEBEAN, BEEFIGE, REDBEET, BLUEBERRY, BROCCOLI, CABBAGE, MELON, CARROT, CASEIN, CASHEWNUT, CAULIFLOWER, CELERY     Note: Lab results reviewed.  PFSH  Drug: Autumn Valenzuela  reports current drug use. Drug: Marijuana. Alcohol:  reports current alcohol use. Tobacco:  reports that she quit smoking about 5 years ago. Her smoking use included cigarettes. She has never used smokeless tobacco. Medical:  has a past medical history of Allergy, Anemia, Anxiety, Arthritis, Asthma, Back pain, Bipolar disorder ([place]), CVA (cerebral infarction), Depression, Diabetes mellitus without complication ([place]), High cholesterol, Hypertension, Left  leg pain, Migraine, Sleep apnea, and Stroke ([place]) (2007). Family: family history includes Cancer in her paternal aunt and paternal aunt; Diabetes in her brother and mother; Stroke in her father.  Past Surgical History:  Procedure Laterality Date   ABDOMINAL AORTOGRAM W/LOWER EXTREMITY N/A 02/08/2021   Procedure: ABDOMINAL AORTOGRAM W/LOWER EXTREMITY;  Surgeon: Marty Heck, MD;  Location: [facility];  Service: Cardiovascular;  Laterality: N/A;   ABDOMINAL HYSTERECTOMY     ANTERIOR LATERAL LUMBAR FUSION WITH PERCUTANEOUS SCREW 1 LEVEL Left 02/11/2018   Procedure: LEFT LATERAL LUMBAR  FOUR-FIVE INTERBODY FUSION WITH INSTRUMENTATION AND ALLOGRAFT;  Surgeon: Phylliss Bob, MD;  Location: [facility];  Service: Orthopedics;  Laterality: Left;  LEFT LATERAL LUMBAR  FOUR-FIVE INTERBODY FUSION WITH INSTRUMENTATION AND ALLOGRAFT   BACK SURGERY     Lumbar fusion L5-S1   DIABETES     ENDARTERECTOMY FEMORAL Left 02/12/2021   Procedure: LEFT COMMON FEMORAL ENDARTERECTOMY WITH BOVINE PATCH;  Surgeon: Marty Heck, MD;  Location: [facility];  Service: Vascular;  Laterality: Left;   EYE SURGERY Bilateral    KNEE SURGERY     l 4-l5 status post lateral posterior fusion January 30,2020     NEUROFORAMINAL STENOSIS Left    Severe L4-L5, involving the level above previous fusion.   PARTIAL HYSTERECTOMY     RADICULOPATHY Left    L4, Secondary to an L4-L5 Spondylolisthesis   SHOULDER SURGERY     Active Ambulatory Problems    Diagnosis Date Noted   Chronic daily headache 11/23/2012   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   Diabetic ketoacidosis ([place]) 03/24/2016   Dermatitis 03/24/2016   HTN (hypertension) 03/24/2016   HLD (hyperlipidemia) 03/24/2016   DM (diabetes mellitus) ([place]) 03/24/2016   Radiculopathy 02/11/2018   Chronic knee  pain (4th area of Pain) (Bilateral) (L>R) 06/18/2018   Chronic migraine without aura, with intractable migraine, so stated, with status  migrainosus 01/27/2019   Diabetic neuropathy with neurologic complication ([place]) 02/33/4356   Claudication of both lower extremities ([place]) 02/04/2019   RLS (restless legs syndrome) 02/04/2019   Sleep deprivation 02/04/2019   Hot flashes 02/04/2019   Neck pain 02/04/2019   Type 2 diabetes mellitus with hyperglycemia, with long-term current use of insulin ([place]) 03/08/2019   Type 2 diabetes mellitus with diabetic polyneuropathy, with long-term current use of insulin (H[facility]) 03/08/2019   Excessive daytime sleepiness 03/15/2019   Pincer nail deformity 05/19/2019   Ulnar 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 of left upper extremity 08/09/2019   Pain in thoracic spine 08/03/2020   Chronic low back pain (1ry area of Pain) (Bilateral) (L>R) w/o sciatica 09/06/2020   Pseudoarthrosis of lumbar spine 12/01/2020   Bipolar disorder in full remission ([place]) 01/17/2021   Decreased dorsalis pedis pulse 01/19/2021   Diabetes mellitus ([place]) 01/19/2021   Peripheral arterial disease ([place]) 02/06/2021   PAD (peripheral artery disease) ([place]) 02/12/2021   Chronic pain syndrome 02/28/2021   Pharmacologic therapy 02/28/2021   Disorder of skeletal system 02/28/2021   Problems influencing health status 02/28/2021   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   History of lumbar fusion 02/28/2021   Osteoarthritis of glenohumeral joint (Right) 02/28/2021   Osteoarthritis of AC (acromioclavicular) joint (Right) 02/28/2021   Lumbosacral facet arthropathy (Multilevel) 02/28/2021   History of lumbar laminectomy (L4-S1) 02/28/2021   Osteoarthritis of knee (Left) 02/28/2021   Severe obesity (BMI 35.0-39.9) with comorbidity ([place]) 02/28/2021   Marijuana use 02/28/2021   Chronic lower extremity pain (2ry area of Pain) (Bilateral) (L>R) 02/28/2021   Chronic hip pain (3ry area of Pain) (Bilateral) (L>R) 02/28/2021   Chronic groin pain (Left) 02/28/2021   Impaired range of motion of hip 02/28/2021   Resolved Ambulatory Problems    Diagnosis  Date Noted   Depression 11/23/2012   Vaginal yeast infection 03/24/2016   Hyperglycemia    Bipolar disorder ([place]) 03/29/2016   Past Medical History:  Diagnosis Date   Allergy    Anemia    Anxiety    Arthritis    Asthma    Back pain    CVA (cerebral infarction)    Diabetes mellitus without complication (HCC)    High cholesterol    Hypertension    Left leg pain    Migraine    Sleep apnea    Stroke ([place]) 2007   Constitutional Exam  General appearance: Well nourished, well developed, and well hydrated. In no apparent acute distress Vitals:   02/28/21 0859  Pulse: 77  Resp: 16  Temp: (!) 97.1 F (36.2 C)  SpO2: 100%  Weight: 204 lb (92.5 kg)  Height: $Remove'5\' 1"'tzhIjlX$  (1.549 m)   BMI Assessment: Estimated body mass index is 38.55 kg/m as calculated from the following:   Height as of this encounter: $RemoveBeforeD'5\' 1"'MgHvjiuHrsUakb$  (1.549 m).   Weight as of this encounter: 204 lb (92.5 kg).  BMI interpretation table: BMI level Category Range association with higher incidence of chronic pain  <18 kg/m2 Underweight   18.5-24.9 kg/m2 Ideal body weight   25-29.9 kg/m2 Overweight Increased incidence by 20%  30-34.9 kg/m2 Obese (Class I) Increased incidence by 68%  35-39.9 kg/m2 Severe obesity (Class II) Increased incidence by 136%  >40 kg/m2 Extreme obesity (Class III) Increased incidence by 254%   Patient's current BMI Ideal Body weight  Body mass index is 38.55 kg/m.  Ideal body weight: 47.8 kg (105 lb 6.1 oz) Adjusted ideal body weight: 65.7 kg (144 lb 13.2 oz)   BMI Readings from Last 4 Encounters:  02/28/21 38.55 kg/m  02/12/21 38.55 kg/m  02/08/21 38.73 kg/m  02/06/21 38.73 kg/m   Wt Readings from Last 4 Encounters:  02/28/21 204 lb (92.5 kg)  02/12/21 204 lb (92.5 kg)  02/08/21 205 lb (93 kg)  02/06/21 205 lb (93 kg)    Psych/Mental status: Alert, oriented x 3 (person, place, & time)       Eyes: PERLA Respiratory: No evidence of acute respiratory distress  Assessment  Primary  Diagnosis & Pertinent Problem List: The primary encounter diagnosis was Chronic pain syndrome. Diagnoses of Failed back surgical syndrome, History of lumbar fusion, Osteoarthritis of glenohumeral joint (Right), Osteoarthritis of AC (acromioclavicular) joint (Right), Lumbosacral facet arthropathy (Multilevel), History of lumbar laminectomy (L4-S1), Osteoarthritis of knee (Left), Severe obesity (BMI 35.0-39.9) with comorbidity [facility]), Pharmacologic therapy, Chronic use of opiate for therapeutic purpose, Marijuana use, Disorder of skeletal system, Problems influencing health status, Chronic low back pain (1ry area of Pain) (Bilateral) (L>R) w/o sciatica, Chronic lower extremity pain (2ry area of Pain) (Bilateral) (L>R), Chronic hip pain (3ry area of Pain) (Bilateral) (L>R), Chronic knee pain (4th area of Pain) (Bilateral) (L>R), Chronic groin pain (Left), and Impaired range of motion of hip were also pertinent to this visit.  Visit Diagnosis (New problems to examiner): 1. Chronic pain syndrome   2. Failed back surgical syndrome   3. History of lumbar fusion   4. Osteoarthritis of glenohumeral joint (Right)   5. Osteoarthritis of AC (acromioclavicular) joint (Right)   6. Lumbosacral facet arthropathy (Multilevel)   7. History of lumbar laminectomy (L4-S1)   8. Osteoarthritis of knee (Left)   9. Severe obesity (BMI 35.0-39.9) with comorbidity ([place])   10. Pharmacologic therapy   11. Chronic use of opiate for therapeutic purpose   12. Marijuana use   13. Disorder of skeletal system   14. Problems influencing health status   15. Chronic low back pain (1ry area of Pain) (Bilateral) (L>R) w/o sciatica   16. Chronic lower extremity pain (2ry area of Pain) (Bilateral) (L>R)   17. Chronic hip pain (3ry area of Pain) (Bilateral) (L>R)   18. Chronic knee pain (4th area of Pain) (Bilateral) (L>R)   19. Chronic groin pain (Left)   20. Impaired range of motion of hip    Plan of Care (Initial workup plan)   Note: Autumn Valenzuela was reminded that as per protocol, today's visit has been an evaluation only. We have not taken over the patient's controlled substance management.  Problem-specific plan: No problem-specific Assessment & Plan notes found for this encounter.  Lab Orders         Compliance Drug Analysis, Ur         Magnesium         Vitamin B12         Sedimentation rate         C-reactive protein         Delta-8 / Delta-9 THC mtb,MS,U         Cannabidiol (CBD)/Tetrahydrocannabinol (THC) Ratio, Urine     Imaging Orders         DG HIP UNILAT W OR W/O PELVIS 2-3 VIEWS RIGHT         DG HIP UNILAT W OR W/O PELVIS 2-3 VIEWS LEFT         DG Knee Complete 4 Views Right  DG Knee Complete 4 Views Left     Referral Orders  No referral(s) requested today   Procedure Orders    No procedure(s) ordered today   Pharmacotherapy (current): Medications ordered:  No orders of the defined types were placed in this encounter.  Medications administered during this visit: Duncan Dull had no medications administered during this visit.   Pharmacological management options:  Opioid Analgesics: The patient was informed that there is no guarantee that she would be a candidate for opioid analgesics. The decision will be made following CDC guidelines. This decision will be based on the results of diagnostic studies, as well as Autumn Valenzuela risk profile.   Membrane stabilizer: To be determined at a later time  Muscle relaxant: To be determined at a later time  NSAID: To be determined at a later time  Other analgesic(s): To be determined at a later time   Interventional management options: Autumn Valenzuela was informed that there is no guarantee that she would be a candidate for interventional therapies. The decision will be based on the results of diagnostic studies, as well as Autumn Valenzuela risk profile.  Procedure(s) under consideration:  Pending results of ordered studies      Interventional  Therapies  Risk   Complexity Considerations:   Estimated body mass index is 38.55 kg/m as calculated from the following:   Height as of this encounter: _0  (1.549 m).   Weight as of this encounter: 204 lb (92.5 kg). IDDMM w/ complications & Hx of ketoacidosis PVD   Planned   Pending:   Pending further evaluation   Under consideration:   Diagnostic bilateral lumbar facet block #1  Diagnostic bilateral IA hip joint injection #1  Diagnostic right IA knee joint injection #1  Diagnostic left genicular nerve block #1    Completed:   None at this time   Therapeutic   Palliative (PRN) options:   None established    Provider-requested follow-up: Return for (61mn), Eval-day (M,W), (F2F), 2nd Visit, for review of ordered tests.  Future Appointments  Date Time Provider [place] 03/06/2021  1:30 PM [facility]  04/16/2021  1:00 PM NMilinda Pointer MD [facility] None  05/18/2021  8:10 AM Shamleffer, IMelanie Crazier MD [facility] None    Note by: FGaspar Cola MD Date: 02/28/2021; Time: 1:16 PM

## 2021-02-28 ENCOUNTER — Ambulatory Visit
Admission: RE | Admit: 2021-02-28 | Discharge: 2021-02-28 | Disposition: A | Discharge status: Home / Self Care | Payer: Medicare HMO | Source: Ambulatory Visit | Attending: Pain Medicine | Admitting: Pain Medicine

## 2021-02-28 ENCOUNTER — Other Ambulatory Visit: Payer: Self-pay

## 2021-02-28 ENCOUNTER — Inpatient Hospital Stay: Admit: 2021-02-28 | Payer: Medicare HMO

## 2021-02-28 ENCOUNTER — Encounter: Payer: Self-pay | Admitting: Pain Medicine

## 2021-02-28 ENCOUNTER — Ambulatory Visit (HOSPITAL_BASED_OUTPATIENT_CLINIC_OR_DEPARTMENT_OTHER): Payer: Medicare HMO | Admitting: Pain Medicine

## 2021-02-28 VITALS — HR 77 | Temp 97.1°F | Resp 16 | Ht 61.0 in | Wt 204.0 lb

## 2021-02-28 DIAGNOSIS — F1290 Cannabis use, unspecified, uncomplicated: Secondary | ICD-10-CM | POA: Insufficient documentation

## 2021-02-28 DIAGNOSIS — M545 Low back pain, unspecified: Secondary | ICD-10-CM | POA: Insufficient documentation

## 2021-02-28 DIAGNOSIS — M1712 Unilateral primary osteoarthritis, left knee: Secondary | ICD-10-CM | POA: Diagnosis not present

## 2021-02-28 DIAGNOSIS — M19011 Primary osteoarthritis, right shoulder: Secondary | ICD-10-CM

## 2021-02-28 DIAGNOSIS — Z96652 Presence of left artificial knee joint: Secondary | ICD-10-CM | POA: Diagnosis not present

## 2021-02-28 DIAGNOSIS — Z981 Arthrodesis status: Secondary | ICD-10-CM | POA: Insufficient documentation

## 2021-02-28 DIAGNOSIS — Z79891 Long term (current) use of opiate analgesic: Secondary | ICD-10-CM | POA: Insufficient documentation

## 2021-02-28 DIAGNOSIS — M25552 Pain in left hip: Secondary | ICD-10-CM

## 2021-02-28 DIAGNOSIS — M25562 Pain in left knee: Secondary | ICD-10-CM | POA: Insufficient documentation

## 2021-02-28 DIAGNOSIS — M47817 Spondylosis without myelopathy or radiculopathy, lumbosacral region: Secondary | ICD-10-CM | POA: Insufficient documentation

## 2021-02-28 DIAGNOSIS — M961 Postlaminectomy syndrome, not elsewhere classified: Secondary | ICD-10-CM | POA: Insufficient documentation

## 2021-02-28 DIAGNOSIS — M79605 Pain in left leg: Secondary | ICD-10-CM

## 2021-02-28 DIAGNOSIS — Z9889 Other specified postprocedural states: Secondary | ICD-10-CM

## 2021-02-28 DIAGNOSIS — M25652 Stiffness of left hip, not elsewhere classified: Secondary | ICD-10-CM | POA: Insufficient documentation

## 2021-02-28 DIAGNOSIS — Z79899 Other long term (current) drug therapy: Secondary | ICD-10-CM | POA: Insufficient documentation

## 2021-02-28 DIAGNOSIS — M25561 Pain in right knee: Secondary | ICD-10-CM

## 2021-02-28 DIAGNOSIS — R1032 Left lower quadrant pain: Secondary | ICD-10-CM

## 2021-02-28 DIAGNOSIS — M25551 Pain in right hip: Secondary | ICD-10-CM | POA: Diagnosis not present

## 2021-02-28 DIAGNOSIS — G8929 Other chronic pain: Secondary | ICD-10-CM

## 2021-02-28 DIAGNOSIS — M1611 Unilateral primary osteoarthritis, right hip: Secondary | ICD-10-CM | POA: Diagnosis not present

## 2021-02-28 DIAGNOSIS — G894 Chronic pain syndrome: Secondary | ICD-10-CM | POA: Insufficient documentation

## 2021-02-28 DIAGNOSIS — M899 Disorder of bone, unspecified: Secondary | ICD-10-CM | POA: Insufficient documentation

## 2021-02-28 DIAGNOSIS — Z789 Other specified health status: Secondary | ICD-10-CM | POA: Insufficient documentation

## 2021-02-28 DIAGNOSIS — M1612 Unilateral primary osteoarthritis, left hip: Secondary | ICD-10-CM | POA: Diagnosis not present

## 2021-02-28 DIAGNOSIS — M79604 Pain in right leg: Secondary | ICD-10-CM | POA: Insufficient documentation

## 2021-02-28 DIAGNOSIS — E669 Obesity, unspecified: Secondary | ICD-10-CM | POA: Insufficient documentation

## 2021-02-28 NOTE — Progress Notes (Signed)
Safety precautions to be maintained throughout the outpatient stay will include: orient to surroundings, keep bed in low position, maintain call bell within reach at all times, provide assistance with transfer out of bed and ambulation.   Patient states that she smokes marijuana BID, She  would like to quit when she gets help for ain and problems.

## 2021-03-02 DIAGNOSIS — G5601 Carpal tunnel syndrome, right upper limb: Secondary | ICD-10-CM | POA: Diagnosis not present

## 2021-03-02 NOTE — Progress Notes (Unsigned)
POST OPERATIVE OFFICE NOTE    CC:  F/u for surgery  HPI:  This is a 61 y.o. female with history of hypertension, hyperlipidemia, diabetes that presents for evaluation of left leg pain with decreased pulse.  Patient describes pain in the left calf when walking that has been worsening over the last several years.  She used to be able to go to the mall and at least walk around part of the third floor.  Now she is unable to get to her car and has since stopped going to the grocery store and to [employer].  She denies any tobacco abuse.  She is on statin.  Previously was seen by Dr. Donnetta Hutching in 2020 and at that time had normal ABIs with palpable pulses and he did not feel she had significant arterial insufficiency.  She underwent angiogram on 02/08/2021 and was found to have a >80% focal left CFA stenosis with a widely patent profunda and widely patent SFA, above and below knee popliteal artery and two vessel runoff via the ATA and PTA.    On 02/12/2021, she underwent a left CFA endarterectomy with bovine patch angioplasty by Dr. Carlis Abbott.  She did well and was discharged home on POD 1.    Pt returns today for wound check.  Pt states ***  No Known Allergies  Current Outpatient Medications  Medication Sig Dispense Refill   Accu-Chek FastClix Lancets MISC TEST 4 TIMES DAILY. DX E11.9 306 each 3   albuterol (PROVENTIL) (2.5 MG/3ML) 0.083% nebulizer solution Take 2.5 mg by nebulization every 6 (six) hours as needed for wheezing.     albuterol (VENTOLIN HFA) 108 (90 Base) MCG/ACT inhaler Inhale 2 puffs into the lungs daily as needed for wheezing or shortness of breath. 18 g 2   aspirin EC 81 MG EC tablet Take 1 tablet (81 mg total) by mouth daily at 6 (six) AM. Swallow whole. 30 tablet 11   Blood Glucose Calibration (ACCU-CHEK GUIDE CONTROL) LIQD 1 each by In Vitro route every 30 (thirty) days. Needs Accu-check guide monitor system. DX. E11.9 (Patient taking differently: 1 each by In Vitro route every 30  (thirty) days. Needs Accu-check guide monitor system. DX. E11.9 2 times daily) 3 each 3   blood glucose meter kit and supplies Dispense based on patient and insurance preference. Use up to four times daily as directed. (FOR ICD-10 E10.9, E11.9). 1 each 0   calcium-vitamin D (OSCAL WITH D) 500-200 MG-UNIT per tablet Take 1 tablet by mouth daily.     Cholecalciferol (VITAMIN D3) 1.25 MG (50000 UT) CAPS Take 1 capsule by mouth once a week. 4 capsule 0   diclofenac Sodium (VOLTAREN) 1 % GEL Apply 2 g topically 4 (four) times daily. (Patient taking differently: Apply 2 g topically 4 (four) times daily as needed (pain).) 100 g 1   DULoxetine (CYMBALTA) 60 MG capsule Take 1 capsule (60 mg total) by mouth at bedtime. 90 capsule 3   empagliflozin (JARDIANCE) 25 MG TABS tablet Take 1 tablet (25 mg total) by mouth daily before breakfast. 90 tablet 3   ferrous sulfate 325 (65 FE) MG tablet Take 1 tablet (325 mg total) by mouth every other day. (Patient taking differently: Take 325 mg by mouth daily.) 45 tablet 3   fluticasone (FLONASE) 50 MCG/ACT nasal spray Place 2 sprays into both nostrils daily as needed for allergies. 16 g 5   gabapentin (NEURONTIN) 300 MG capsule Take 1 capsule (300 mg total) by mouth 2 (two) times  daily. 180 capsule 3   HYDROcodone-acetaminophen (NORCO/VICODIN) 5-325 MG tablet Take 1 tablet by mouth every 6 (six) hours as needed for moderate pain. 8 tablet 0   insulin aspart (NOVOLOG FLEXPEN) 100 UNIT/ML FlexPen Max daily 30 units (Patient taking differently: Inject 1-4 Units into the skin 3 (three) times daily as needed (blood sugar over 150). Max daily 30 units) 15 mL 6   Insulin Glargine (BASAGLAR KWIKPEN) 100 UNIT/ML Inject 26 Units into the skin at bedtime.     Insulin Pen Needle 31G X 5 MM MISC 1 Units by Does not apply route in the morning, at noon, in the evening, and at bedtime. 400 each 1   metFORMIN (GLUCOPHAGE-XR) 500 MG 24 hr tablet Take 1 tablet (500 mg total) by mouth in the  morning and at bedtime. 180 tablet 3   ONETOUCH VERIO test strip CHECK UP TO 4 TIMES A DAY AS DIRECTED 100 strip 2   rosuvastatin (CRESTOR) 20 MG tablet Take 1 tablet (20 mg total) by mouth daily. (Patient taking differently: Take 20 mg by mouth 2 (two) times a week.) 90 tablet 3   No current facility-administered medications for this visit.     ROS:  See HPI  Physical Exam:  ***  Incision:  *** Extremities:  *** Neuro: *** Abdomen:  ***   Assessment/Plan:  This is a 61 y.o. female who is s/p: left CFA endarterectomy with bovine patch angioplasty by Dr. Carlis Abbott on 02/12/2021  -***   Leontine Locket, [facility] [phone]   Clinic MD:  Carlis Abbott

## 2021-03-05 DIAGNOSIS — M25552 Pain in left hip: Secondary | ICD-10-CM | POA: Diagnosis not present

## 2021-03-06 ENCOUNTER — Other Ambulatory Visit: Payer: Self-pay | Admitting: Sports Medicine

## 2021-03-06 DIAGNOSIS — R102 Pelvic and perineal pain: Secondary | ICD-10-CM

## 2021-03-08 ENCOUNTER — Other Ambulatory Visit: Payer: Medicare HMO

## 2021-03-13 ENCOUNTER — Other Ambulatory Visit: Payer: Self-pay

## 2021-03-13 ENCOUNTER — Encounter: Payer: Self-pay | Admitting: Vascular Surgery

## 2021-03-13 ENCOUNTER — Ambulatory Visit (INDEPENDENT_AMBULATORY_CARE_PROVIDER_SITE_OTHER): Payer: Medicare HMO | Admitting: Vascular Surgery

## 2021-03-13 VITALS — BP 103/72 | HR 77 | Temp 97.8°F | Resp 14 | Ht 61.0 in | Wt 203.0 lb

## 2021-03-13 DIAGNOSIS — I739 Peripheral vascular disease, unspecified: Secondary | ICD-10-CM

## 2021-03-13 NOTE — Progress Notes (Signed)
Patient name: Autumn Valenzuela MRN: [id number] DOB: 1960-03-22 Sex: female  REASON FOR VISIT: Postop check after left common femoral endarterectomy  HPI: Autumn Valenzuela is a 61 y.o. female who presents for postop check after left common femoral endarterectomy.  She underwent a left common femoral endarterectomy with bovine patch on 02/12/2021 for lifestyle limiting claudication.  States her groin has healed without issue.  She is having no symptoms in the left leg and her claudication has resolved.  She is happy with her progress.  Taking an aspirin daily.  Past Medical History:  Diagnosis Date   Allergy    Anemia    Anxiety    Arthritis    Asthma    Back pain    Bipolar disorder ([place])    CVA (cerebral infarction)    Depression    Diabetes mellitus without complication ([place])    High cholesterol    Hypertension    Left leg pain    Migraine    Sleep apnea    does not use a cpap   Stroke ([place]) 2007   no lasting weakness    Past Surgical History:  Procedure Laterality Date   ABDOMINAL AORTOGRAM W/LOWER EXTREMITY N/A 02/08/2021   Procedure: ABDOMINAL AORTOGRAM W/LOWER EXTREMITY;  Surgeon: Marty Heck, MD;  Location: [place] [facility];  Service: Cardiovascular;  Laterality: N/A;   ABDOMINAL HYSTERECTOMY     ANTERIOR LATERAL LUMBAR FUSION WITH PERCUTANEOUS SCREW 1 LEVEL Left 02/11/2018   Procedure: LEFT LATERAL LUMBAR  FOUR-FIVE INTERBODY FUSION WITH INSTRUMENTATION AND ALLOGRAFT;  Surgeon: Phylliss Bob, MD;  Location: [facility];  Service: Orthopedics;  Laterality: Left;  LEFT LATERAL LUMBAR  FOUR-FIVE INTERBODY FUSION WITH INSTRUMENTATION AND ALLOGRAFT   BACK SURGERY     Lumbar fusion L5-S1   DIABETES     ENDARTERECTOMY FEMORAL Left 02/12/2021   Procedure: LEFT COMMON FEMORAL ENDARTERECTOMY WITH BOVINE PATCH;  Surgeon: Marty Heck, MD;  Location: [place];  Service: Vascular;  Laterality: Left;   EYE SURGERY Bilateral    KNEE SURGERY     l 4-l5 status post lateral  posterior fusion January 30,2020     NEUROFORAMINAL STENOSIS Left    Severe L4-L5, involving the level above previous fusion.   PARTIAL HYSTERECTOMY     RADICULOPATHY Left    L4, Secondary to an L4-L5 Spondylolisthesis   SHOULDER SURGERY      Family History  Problem Relation Age of Onset   Diabetes Mother    Stroke Father    Cancer Paternal Aunt    Cancer Paternal Aunt    Diabetes Brother    Colon cancer Neg Hx    Esophageal cancer Neg Hx    Rectal cancer Neg Hx    Stomach cancer Neg Hx     SOCIAL HISTORY: Social History   Tobacco Use   Smoking status: Former    Types: Cigarettes    Quit date: 12/13/2015    Years since quitting: 5.2   Smokeless tobacco: Never  Substance Use Topics   Alcohol use: Yes    Comment: Occas    No Known Allergies  Current Outpatient Medications  Medication Sig Dispense Refill   Accu-Chek FastClix Lancets MISC TEST 4 TIMES DAILY. DX E11.9 306 each 3   albuterol (PROVENTIL) (2.5 MG/3ML) 0.083% nebulizer solution Take 2.5 mg by nebulization every 6 (six) hours as needed for wheezing.     albuterol (VENTOLIN HFA) 108 (90 Base) MCG/ACT inhaler Inhale 2 puffs into the lungs daily  as needed for wheezing or shortness of breath. 18 g 2   aspirin EC 81 MG EC tablet Take 1 tablet (81 mg total) by mouth daily at 6 (six) AM. Swallow whole. 30 tablet 11   Blood Glucose Calibration (ACCU-CHEK GUIDE CONTROL) LIQD 1 each by In Vitro route every 30 (thirty) days. Needs Accu-check guide monitor system. DX. E11.9 (Patient taking differently: 1 each by In Vitro route every 30 (thirty) days. Needs Accu-check guide monitor system. DX. E11.9 2 times daily) 3 each 3   blood glucose meter kit and supplies Dispense based on patient and insurance preference. Use up to four times daily as directed. (FOR ICD-10 E10.9, E11.9). 1 each 0   calcium-vitamin D (OSCAL WITH D) 500-200 MG-UNIT per tablet Take 1 tablet by mouth daily.     Cholecalciferol (VITAMIN D3) 1.25 MG (50000  UT) CAPS Take 1 capsule by mouth once a week. 4 capsule 0   diclofenac Sodium (VOLTAREN) 1 % GEL Apply 2 g topically 4 (four) times daily. (Patient taking differently: Apply 2 g topically 4 (four) times daily as needed (pain).) 100 g 1   DULoxetine (CYMBALTA) 60 MG capsule Take 1 capsule (60 mg total) by mouth at bedtime. 90 capsule 3   empagliflozin (JARDIANCE) 25 MG TABS tablet Take 1 tablet (25 mg total) by mouth daily before breakfast. 90 tablet 3   ferrous sulfate 325 (65 FE) MG tablet Take 1 tablet (325 mg total) by mouth every other day. (Patient taking differently: Take 325 mg by mouth daily.) 45 tablet 3   fluticasone (FLONASE) 50 MCG/ACT nasal spray Place 2 sprays into both nostrils daily as needed for allergies. 16 g 5   gabapentin (NEURONTIN) 300 MG capsule Take 1 capsule (300 mg total) by mouth 2 (two) times daily. 180 capsule 3   HYDROcodone-acetaminophen (NORCO/VICODIN) 5-325 MG tablet Take 1 tablet by mouth every 6 (six) hours as needed for moderate pain. 8 tablet 0   insulin aspart (NOVOLOG FLEXPEN) 100 UNIT/ML FlexPen Max daily 30 units (Patient taking differently: Inject 1-4 Units into the skin 3 (three) times daily as needed (blood sugar over 150). Max daily 30 units) 15 mL 6   Insulin Glargine (BASAGLAR KWIKPEN) 100 UNIT/ML Inject 26 Units into the skin at bedtime.     Insulin Pen Needle 31G X 5 MM MISC 1 Units by Does not apply route in the morning, at noon, in the evening, and at bedtime. 400 each 1   metFORMIN (GLUCOPHAGE-XR) 500 MG 24 hr tablet Take 1 tablet (500 mg total) by mouth in the morning and at bedtime. 180 tablet 3   ONETOUCH VERIO test strip CHECK UP TO 4 TIMES A DAY AS DIRECTED 100 strip 2   rosuvastatin (CRESTOR) 20 MG tablet Take 1 tablet (20 mg total) by mouth daily. (Patient taking differently: Take 20 mg by mouth 2 (two) times a week.) 90 tablet 3   No current facility-administered medications for this visit.    REVIEW OF SYSTEMS:  '[X]'  denotes positive  finding, '[ ]'  denotes negative finding Cardiac  Comments:  Chest pain or chest pressure:    Shortness of breath upon exertion:    Short of breath when lying flat:    Irregular heart rhythm:        Vascular    Pain in calf, thigh, or hip brought on by ambulation:    Pain in feet at night that wakes you up from your sleep:     Blood clot in  your veins:    Leg swelling:         Pulmonary    Oxygen at home:    Productive cough:     Wheezing:         Neurologic    Sudden weakness in arms or legs:     Sudden numbness in arms or legs:     Sudden onset of difficulty speaking or slurred speech:    Temporary loss of vision in one eye:     Problems with dizziness:         Gastrointestinal    Blood in stool:     Vomited blood:         Genitourinary    Burning when urinating:     Blood in urine:        Psychiatric    Major depression:         Hematologic    Bleeding problems:    Problems with blood clotting too easily:        Skin    Rashes or ulcers:        Constitutional    Fever or chills:      PHYSICAL EXAM: Vitals:   03/13/21 1110  BP: 103/72  Pulse: 77  Resp: 14  Temp: 97.8 F (36.6 C)  TempSrc: Temporal  SpO2: 96%  Weight: 203 lb (92.1 kg)  Height: '5\' 1"'  (1.549 m)    GENERAL: The patient is a well-nourished female, in no acute distress. The vital signs are documented above. CARDIAC: There is a regular rate and rhythm.  VASCULAR:  Left femoral pulse palpable Left DP palpable Left groin incision clean dry and intact Right DP palpable  DATA:   None  Assessment/Plan:  61 year old female status post left common femoral endarterectomy with bovine patch on 02/12/2021 for lifestyle limiting claudication.  Her groin incision has healed nicely.  She has a palpable femoral and dorsalis pedis pulse in the left leg.  Pleased with her progress.  Discussed she take 81 mg aspirin daily from my standpoint.  I will see her in 9 months with a left leg arterial duplex  to look for any evidence of restenosis in the patch.  Discussed she call with questions or concerns.   Marty Heck, MD [facility] [place] Office: [phone]

## 2021-03-14 ENCOUNTER — Other Ambulatory Visit: Payer: Self-pay | Admitting: Neurology

## 2021-03-14 DIAGNOSIS — E7409 Other glycogen storage disease: Secondary | ICD-10-CM

## 2021-03-15 ENCOUNTER — Ambulatory Visit
Admission: RE | Admit: 2021-03-15 | Discharge: 2021-03-15 | Disposition: A | Discharge status: Home / Self Care | Payer: Medicare HMO | Source: Ambulatory Visit | Attending: Sports Medicine | Admitting: Sports Medicine

## 2021-03-15 DIAGNOSIS — L0390 Cellulitis, unspecified: Secondary | ICD-10-CM | POA: Diagnosis not present

## 2021-03-15 DIAGNOSIS — R102 Pelvic and perineal pain: Secondary | ICD-10-CM

## 2021-03-15 DIAGNOSIS — R1032 Left lower quadrant pain: Secondary | ICD-10-CM | POA: Diagnosis not present

## 2021-03-15 DIAGNOSIS — I889 Nonspecific lymphadenitis, unspecified: Secondary | ICD-10-CM | POA: Diagnosis not present

## 2021-03-15 DIAGNOSIS — I700 Atherosclerosis of aorta: Secondary | ICD-10-CM | POA: Diagnosis not present

## 2021-03-15 MED ORDER — IOPAMIDOL (ISOVUE-300) INJECTION 61%
100.0000 mL | Freq: Once | INTRAVENOUS | Status: AC | PRN
  Administered 2021-03-15: 100 mL via INTRAVENOUS

## 2021-03-16 ENCOUNTER — Other Ambulatory Visit: Payer: Self-pay | Admitting: *Deleted

## 2021-03-16 DIAGNOSIS — I739 Peripheral vascular disease, unspecified: Secondary | ICD-10-CM

## 2021-03-20 ENCOUNTER — Other Ambulatory Visit: Payer: Self-pay | Admitting: Nurse Practitioner

## 2021-03-20 DIAGNOSIS — E1159 Type 2 diabetes mellitus with other circulatory complications: Secondary | ICD-10-CM

## 2021-03-20 DIAGNOSIS — E782 Mixed hyperlipidemia: Secondary | ICD-10-CM

## 2021-03-28 ENCOUNTER — Other Ambulatory Visit: Payer: Self-pay | Admitting: Neurosurgery

## 2021-03-28 ENCOUNTER — Telehealth: Payer: Self-pay | Admitting: Nurse Practitioner

## 2021-03-28 NOTE — Chronic Care Management (AMB) (Signed)
?  Chronic Care Management  ? ?Outreach Note ? ?03/28/2021 ?Name: Autumn Valenzuela MRN: [id number] DOB: 05-04-1960 ? ?Referred by: Flossie Buffy, NP ?Reason for referral : No chief complaint on file. ? ? ?An unsuccessful telephone outreach was attempted today. The patient was referred to the pharmacist for assistance with care management and care coordination.  ? ?Follow Up Plan:  ? ?Tatjana Dellinger ?Upstream Scheduler  ?

## 2021-03-29 ENCOUNTER — Other Ambulatory Visit: Payer: Self-pay | Admitting: Nurse Practitioner

## 2021-03-29 DIAGNOSIS — M25552 Pain in left hip: Secondary | ICD-10-CM | POA: Diagnosis not present

## 2021-03-30 NOTE — Progress Notes (Signed)
? ?Acute Office Visit ? ?Subjective:  ? ? Patient ID: Autumn Valenzuela, female    DOB: [birth year], 61 y.o.   MRN: [id number] ? ?Chief Complaint  ?Patient presents with  ? Follow-up  ?  Pt is here for CT scan of hip f/u. Chronic pain in left hip, knee and leg. Requesting refill on albuterol sulfate  ? ? ?HPI ?Patient is in today for chronic pain to her left hip and swelling to her left knee. She endorses swelling left knee and leg swelling for the past few months. She endorses pain the the left buttock that goes down her leg to her knee. She had a femoral endarterectomy done on 02/12/21.  She rates the pain as a 9/10.  She states that sometimes it feels like her leg is getting give out and she has to hold onto the wall.  She has been to orthopedics and pain management.  She had an x-ray of her left knee which was normal.  She then recently had a CT scan on 03/15/2021 which showed possible cellulitis in left inguinal adenopathy.  She denies any rashes, changes in skin, fevers.  She does endorse urinary frequency and burning. ? ?Past Medical History:  ?Diagnosis Date  ? Allergy   ? Anemia   ? Anxiety   ? Arthritis   ? Asthma   ? Back pain   ? Bipolar disorder ([place])   ? CVA (cerebral infarction)   ? Depression   ? Diabetes mellitus without complication ([place])   ? High cholesterol   ? Hypertension   ? Left leg pain   ? Migraine   ? Sleep apnea   ? does not use a cpap  ? Stroke [facility]) 2007  ? no lasting weakness  ? ? ?Past Surgical History:  ?Procedure Laterality Date  ? ABDOMINAL AORTOGRAM W/LOWER EXTREMITY N/A 02/08/2021  ? Procedure: ABDOMINAL AORTOGRAM W/LOWER EXTREMITY;  Surgeon: Marty Heck, MD;  Location: [facility];  Service: Cardiovascular;  Laterality: N/A;  ? ABDOMINAL HYSTERECTOMY    ? ANTERIOR LATERAL LUMBAR FUSION WITH PERCUTANEOUS SCREW 1 LEVEL Left 02/11/2018  ? Procedure: LEFT LATERAL LUMBAR  FOUR-FIVE INTERBODY FUSION WITH INSTRUMENTATION AND ALLOGRAFT;  Surgeon: Phylliss Bob, MD;  Location: [place];  Service: Orthopedics;  Laterality: Left;  LEFT LATERAL LUMBAR  FOUR-FIVE INTERBODY FUSION WITH INSTRUMENTATION AND ALLOGRAFT  ? BACK SURGERY    ? Lumbar fusion L5-S1  ? DIABETES    ? ENDARTERECTOMY FEMORAL Left 02/12/2021  ? Procedure: LEFT COMMON FEMORAL ENDARTERECTOMY WITH BOVINE PATCH;  Surgeon: Marty Heck, MD;  Location: [place];  Service: Vascular;  Laterality: Left;  ? EYE SURGERY Bilateral   ? KNEE SURGERY    ? l 4-l5 status post lateral posterior fusion January 30,2020    ? NEUROFORAMINAL STENOSIS Left   ? Severe L4-L5, involving the level above previous fusion.  ? PARTIAL HYSTERECTOMY    ? RADICULOPATHY Left   ? L4, Secondary to an L4-L5 Spondylolisthesis  ? SHOULDER SURGERY    ? ? ?Family History  ?Problem Relation Age of Onset  ? Diabetes Mother   ? Stroke Father   ? Cancer Paternal Aunt   ? Cancer Paternal Aunt   ? Diabetes Brother   ? Colon cancer Neg Hx   ? Esophageal cancer Neg Hx   ? Rectal cancer Neg Hx   ? Stomach cancer Neg Hx   ? ? ?Social History  ? ?Socioeconomic History  ? Marital status: Single  ?  Spouse name: Not on file  ? Number of children: 3  ? Years of education: 11  ? Highest education level: Not on file  ?Occupational History  ? Not on file  ?Tobacco Use  ? Smoking status: Former  ?  Types: Cigarettes  ?  Quit date: 12/13/2015  ?  Years since quitting: 5.3  ? Smokeless tobacco: Never  ?Vaping Use  ? Vaping Use: Never used  ?Substance and Sexual Activity  ? Alcohol use: Yes  ?  Comment: Occas  ? Drug use: Yes  ?  Types: Marijuana  ? Sexual activity: Not Currently  ?  Comment: 1st intercourse 61 yo-Fewer than 5 partners  ?Other Topics Concern  ? Not on file  ?Social History Narrative  ? Patient is single with 3 children.  ? Patient is right handed.  ? Patient has a high school education.  ? Patient drinks at least 3 cups of caffeine daily.  ? ?Social Determinants of Health  ? ?Financial Resource Strain: Medium Risk  ? Difficulty of Paying Living Expenses: Somewhat hard   ?Food Insecurity: No Food Insecurity  ? Worried About Charity fundraiser in the Last Year: Never true  ? Ran Out of Food in the Last Year: Never true  ?Transportation Needs: No Transportation Needs  ? Lack of Transportation (Medical): No  ? Lack of Transportation (Non-Medical): No  ?Physical Activity: Inactive  ? Days of Exercise per Week: 0 days  ? Minutes of Exercise per Session: 0 min  ?Stress: Stress Concern Present  ? Feeling of Stress : To some extent  ?Social Connections: Socially Isolated  ? Frequency of Communication with Friends and Family: Three times a week  ? Frequency of Social Gatherings with Friends and Family: More than three times a week  ? Attends Religious Services: Never  ? Active Member of Clubs or Organizations: No  ? Attends Archivist Meetings: Never  ? Marital Status: Separated  ?Intimate Partner Violence: Not At Risk  ? Fear of Current or Ex-Partner: No  ? Emotionally Abused: No  ? Physically Abused: No  ? Sexually Abused: No  ? ? ?Outpatient Medications Prior to Visit  ?Medication Sig Dispense Refill  ? Accu-Chek FastClix Lancets MISC TEST 4 TIMES DAILY. DX E11.9 306 each 3  ? albuterol (PROVENTIL) (2.5 MG/3ML) 0.083% nebulizer solution Take 2.5 mg by nebulization every 6 (six) hours as needed for wheezing.    ? aspirin EC 81 MG EC tablet Take 1 tablet (81 mg total) by mouth daily at 6 (six) AM. Swallow whole. 30 tablet 11  ? Blood Glucose Calibration (ACCU-CHEK GUIDE CONTROL) LIQD 1 each by In Vitro route every 30 (thirty) days. Needs Accu-check guide monitor system. DX. E11.9 (Patient taking differently: 1 each by In Vitro route every 30 (thirty) days. Needs Accu-check guide monitor system. DX. E11.9 2 times daily) 3 each 3  ? blood glucose meter kit and supplies Dispense based on patient and insurance preference. Use up to four times daily as directed. (FOR ICD-10 E10.9, E11.9). 1 each 0  ? calcium-vitamin D (OSCAL WITH D) 500-200 MG-UNIT per tablet Take 1 tablet by mouth  daily.    ? Cholecalciferol (VITAMIN D3) 1.25 MG (50000 UT) CAPS Take 1 capsule by mouth once a week. 4 capsule 0  ? diclofenac Sodium (VOLTAREN) 1 % GEL Apply 2 g topically 4 (four) times daily. (Patient taking differently: Apply 2 g topically 4 (four) times daily as needed (pain).) 100 g 1  ?  DULoxetine (CYMBALTA) 60 MG capsule Take 1 capsule (60 mg total) by mouth at bedtime. 90 capsule 3  ? empagliflozin (JARDIANCE) 25 MG TABS tablet Take 1 tablet (25 mg total) by mouth daily before breakfast. 90 tablet 3  ? ferrous sulfate 325 (65 FE) MG tablet Take 1 tablet (325 mg total) by mouth every other day. (Patient taking differently: Take 325 mg by mouth daily.) 45 tablet 3  ? fluticasone (FLONASE) 50 MCG/ACT nasal spray Place 2 sprays into both nostrils daily as needed for allergies. 16 g 5  ? gabapentin (NEURONTIN) 300 MG capsule TAKE 1 CAPSULE BY MOUTH TWICE DAILY 180 capsule 3  ? HYDROcodone-acetaminophen (NORCO/VICODIN) 5-325 MG tablet Take 1 tablet by mouth every 6 (six) hours as needed for moderate pain. 8 tablet 0  ? insulin aspart (NOVOLOG FLEXPEN) 100 UNIT/ML FlexPen Max daily 30 units (Patient taking differently: Inject 1-4 Units into the skin 3 (three) times daily as needed (blood sugar over 150). Max daily 30 units) 15 mL 6  ? Insulin Glargine (BASAGLAR KWIKPEN) 100 UNIT/ML Inject 26 Units into the skin at bedtime.    ? Insulin Pen Needle 31G X 5 MM MISC 1 Units by Does not apply route in the morning, at noon, in the evening, and at bedtime. 400 each 1  ? metFORMIN (GLUCOPHAGE-XR) 500 MG 24 hr tablet Take 1 tablet (500 mg total) by mouth in the morning and at bedtime. 180 tablet 3  ? ONETOUCH VERIO test strip CHECK UP TO 4 TIMES A DAY AS DIRECTED 100 strip 2  ? rosuvastatin (CRESTOR) 20 MG tablet TAKE 1 TABLET(20 MG) BY MOUTH DAILY 90 tablet 1  ? albuterol (VENTOLIN HFA) 108 (90 Base) MCG/ACT inhaler Inhale 2 puffs into the lungs daily as needed for wheezing or shortness of breath. 18 g 2  ? ?No  facility-administered medications prior to visit.  ? ? ?No Known Allergies ? ?Review of Systems ?See pertinent positives and negatives per HPI. ?   ?Objective:  ?  ?Physical Exam ?Vitals and nursing note reviewed.

## 2021-04-02 ENCOUNTER — Encounter: Payer: Self-pay | Admitting: Nurse Practitioner

## 2021-04-02 ENCOUNTER — Other Ambulatory Visit: Payer: Self-pay

## 2021-04-02 ENCOUNTER — Ambulatory Visit (INDEPENDENT_AMBULATORY_CARE_PROVIDER_SITE_OTHER): Payer: Medicare HMO | Admitting: Nurse Practitioner

## 2021-04-02 VITALS — BP 134/95 | HR 83 | Temp 96.5°F | Wt 207.0 lb

## 2021-04-02 DIAGNOSIS — R0602 Shortness of breath: Secondary | ICD-10-CM | POA: Diagnosis not present

## 2021-04-02 DIAGNOSIS — G8929 Other chronic pain: Secondary | ICD-10-CM | POA: Diagnosis not present

## 2021-04-02 DIAGNOSIS — R1032 Left lower quadrant pain: Secondary | ICD-10-CM

## 2021-04-02 DIAGNOSIS — R350 Frequency of micturition: Secondary | ICD-10-CM | POA: Diagnosis not present

## 2021-04-02 LAB — POCT URINALYSIS DIPSTICK
Bilirubin, UA: NEGATIVE
Blood, UA: NEGATIVE
Glucose, UA: NEGATIVE
Ketones, UA: NEGATIVE
Nitrite, UA: NEGATIVE
Protein, UA: POSITIVE — AB
Spec Grav, UA: 1.02 (ref 1.010–1.025)
Urobilinogen, UA: NEGATIVE E.U./dL — AB
pH, UA: 6 (ref 5.0–8.0)

## 2021-04-02 MED ORDER — ALBUTEROL SULFATE HFA 108 (90 BASE) MCG/ACT IN AERS: 2.0000 | INHALATION_SPRAY | Freq: Every day | RESPIRATORY_TRACT | 2 refills | Status: DC | PRN

## 2021-04-02 MED ORDER — DOXYCYCLINE HYCLATE 100 MG PO TABS: 100.0000 mg | ORAL_TABLET | Freq: Two times a day (BID) | ORAL | 0 refills | Status: DC

## 2021-04-02 NOTE — Patient Instructions (Signed)
It was great to see you! ? ?Start doxycycline 1 tablet twice a day for 10 days.  Make sure you take this with a full glass of water.  We are also going to check some blood work and a urine with your urinary frequency. ? ?Let's follow-up in 2 weeks, sooner if you have concerns. ? ?If a referral was placed today, you will be contacted for an appointment. Please note that routine referrals can sometimes take up to 3-4 weeks to process. Please call our office if you haven't heard anything after this time frame. ? ?Take care, ? ?Vance Peper, NP ? ?

## 2021-04-02 NOTE — Assessment & Plan Note (Addendum)
Chronic ongoing left hip pain, knee pain and swelling.  She seen several providers for this.  She last had a CT scan on 03/15/2021 which showed left inguinal adenopathy with possible cellulitis.  Even though there is no rash on skin, she did recently have a left femoral endarterectomy.  We will treat with doxycycline 100 mg twice daily x10 days.  Check CBC.  Follow-up in 2 weeks with PCP. ?

## 2021-04-03 LAB — CBC WITH DIFFERENTIAL/PLATELET
Basophils Absolute: 0.1 10*3/uL (ref 0.0–0.1)
Basophils Relative: 0.7 % (ref 0.0–3.0)
Eosinophils Absolute: 0.4 10*3/uL (ref 0.0–0.7)
Eosinophils Relative: 2.6 % (ref 0.0–5.0)
HCT: 38.9 % (ref 36.0–46.0)
Hemoglobin: 12.4 g/dL (ref 12.0–15.0)
Lymphocytes Relative: 22.3 % (ref 12.0–46.0)
Lymphs Abs: 3.2 10*3/uL (ref 0.7–4.0)
MCHC: 31.9 g/dL (ref 30.0–36.0)
MCV: 86.3 fl (ref 78.0–100.0)
Monocytes Absolute: 0.9 10*3/uL (ref 0.1–1.0)
Monocytes Relative: 6 % (ref 3.0–12.0)
Neutro Abs: 9.9 10*3/uL — ABNORMAL HIGH (ref 1.4–7.7)
Neutrophils Relative %: 68.4 % (ref 43.0–77.0)
Platelets: 229 10*3/uL (ref 150.0–400.0)
RBC: 4.51 Mil/uL (ref 3.87–5.11)
RDW: 16.3 % — ABNORMAL HIGH (ref 11.5–15.5)
WBC: 14.5 10*3/uL — ABNORMAL HIGH (ref 4.0–10.5)

## 2021-04-04 ENCOUNTER — Telehealth: Payer: Self-pay | Admitting: Nurse Practitioner

## 2021-04-04 NOTE — Progress Notes (Signed)
Called and informed patient of results and provider instructions. Patient voiced understanding. Pt will f/u with her pcp with upcoming appt on 4/19

## 2021-04-04 NOTE — Chronic Care Management (AMB) (Signed)
?  Chronic Care Management  ? ?Note ? ?04/04/2021 ?Name: Autumn Valenzuela MRN: [id number] DOB: 1960-09-15 ? ?Autumn Valenzuela is a 61 y.o. year old female who is a primary care patient of Nche, Charlene Brooke, NP. I reached out to Duncan Dull by phone today in response to a referral sent by Ms. Ireoluwa D Nix's PCP, Nche, Charlene Brooke, NP.  ? ?[name] was given information about Chronic Care Management services today including:  ?CCM service includes personalized support from designated clinical staff supervised by her physician, including individualized plan of care and coordination with other care providers ?24/7 contact phone numbers for assistance for urgent and routine care needs. ?Service will only be billed when office clinical staff spend 20 minutes or more in a month to coordinate care. ?Only one practitioner may furnish and bill the service in a calendar month. ?The patient may stop CCM services at any time (effective at the end of the month) by phone call to the office staff. ? ? ?Patient agreed to services and verbal consent obtained.  ? ?Follow up plan: ? ? ?Tatjana Dellinger ?Upstream Scheduler  ?

## 2021-04-11 ENCOUNTER — Ambulatory Visit: Payer: Medicare HMO | Admitting: Nurse Practitioner

## 2021-04-12 NOTE — Progress Notes (Deleted)
No-show to follow-up appointment ?

## 2021-04-16 ENCOUNTER — Other Ambulatory Visit: Payer: Self-pay | Admitting: Pain Medicine

## 2021-04-16 ENCOUNTER — Ambulatory Visit: Payer: Medicare HMO | Admitting: Pain Medicine

## 2021-04-16 DIAGNOSIS — Z91199 Patient's noncompliance with other medical treatment and regimen due to unspecified reason: Secondary | ICD-10-CM

## 2021-04-16 DIAGNOSIS — F1291 Cannabis use, unspecified, in remission: Secondary | ICD-10-CM | POA: Insufficient documentation

## 2021-04-16 DIAGNOSIS — Z8639 Personal history of other endocrine, nutritional and metabolic disease: Secondary | ICD-10-CM | POA: Insufficient documentation

## 2021-04-16 DIAGNOSIS — R937 Abnormal findings on diagnostic imaging of other parts of musculoskeletal system: Secondary | ICD-10-CM | POA: Insufficient documentation

## 2021-04-16 NOTE — Progress Notes (Signed)
NO SHOW to 2nd Visit (F/U of initial evaluation) ? ?Precharting Review of initial evaluation (02/28/2021): ?"Review of electronic MEDICAL RECORD NUMBER?Left lateral L4-5 interbody fusion with instrumentation and allograft by Dr. Lynann Bologna (02/11/2018) (orthopedic surgery) ?Left L4-5 posterior spinal fusion with instrumentation and allograft by Dr. Lynann Bologna (02/12/2018) (orthopedic surgery) ?Posterior lateral fusion L4-5 redo arthrodesis with replacement of bilateral L4 pedicle screws by Dr. Kary Kos (12/01/2020) (noted surgery) ? ?According to the patient the primary area of pain is that of the lower back (Bilateral) (L>R).  The patient indicates having had 3 prior back surgeries.  She also indicates having had physical therapy multiple times which apparently helped only the first time.  She does indicate having had a recent x-ray and a CT myelogram of the lumbar spine.  She denies ever having had any nerve blocks in the lumbar region. ? ?The patient's secondary area pain is that of the lower extremities (Bilateral) (L>R).  The patient indicates having had a left total knee replacement but no other surgeries in her leg.  She again indicates having had some physical therapy after that hip replacement which did help.  She refers having had some x-rays done of her hip had at Dr. Windy Carina office, but these seem to have been done for private use since they are not available to [place].  She denies any prior injections in the lower extremities.  The patient also denies any nerve conduction test of the lower extremities.  Either one of the lower extremity pain go below her knee.  She denies any pain, numbness, or weakness below her knees.  This bilateral lower extremity pain runs through the back of the leg into the area of the knee.  She also indicates taking cortisone injections into her feet.  When she first mentioned it, it sounded as if she was getting them on a regular basis, but as it turns out she refers having an injection  every 2 years..  She refers getting these injections for pain in the bottom of her feet and what appears to be an S1 dermatomal distribution bilaterally.  She denies ever having had a nerve conduction test and she normally goes to the foot center one [facility] for those injections. ? ?The patient's third area pain is that of the hips (Bilateral) (L>R).  She denies any prior hip surgery, recent physical therapy, x-rays, or any nerve blocks.  When talking about her hip, she points that her PSIS area.  She refers having pain that goes into her buttocks (Bilateral) as well as left groin. ? ?The patient's fourth area pain is that of the knees (Bilateral) (L>R).  She indicates having had a left total knee replacement.  She also indicates having had some physical therapy after that replacement which did help.  She denies any joint injections, nerve blocks, or any recent x-rays. ? ?Other than the above, the patient indicates having had a left carpal tunnel and elbow release surgery and pending a right carpal tunnel release this Friday.  Other medical problems include a history of having had a stroke in 2007 with residual left-sided weakness. ? ?Physical exam: The patient was able to toe walk and heel walk without any problems.  Straight leg raise was completely negative bilaterally.  Lumbar flexion was within normal limits and asymptomatic.  Lumbar extension demonstrated decreased range of motion with pain being referred towards the lower portion of the back, bilaterally.  Hyperextension and rotation of the lumbar spine and Kemp maneuver were positive bilaterally  for lumbar facet arthralgia with the pain being primarily in the area of the lower back.  Patrick maneuver was positive bilaterally for sacroiliac joint arthralgia and bilateral hip joint arthralgia with the left side being worse than the right. ? ?The patient did inform the nurse that she does smoke marijuana frequently, according to her for  pain. ? ?Pharmacotherapy: The patient indicates taking Norco every 6 hours for the pain." ? ?Controlled Substance Pharmacotherapy Assessment ?REMS (Risk Evaluation and Mitigation Strategy)  ?Opioid Analgesic: Hydrocodone/APAP 5/325 1 tab p.o. every 6 hours (last filled on 03/27/2021) (possible multiple prescribers) ?MME/day: 20 mg/day  ?List of other Serum/Urine Drug Screening Test(s):  ?No results found.  Patient left on 02/28/2021 without providing [place] with a urine sample (UDS), and  (delta 8 vs delta 9 testing). ? ?Risk Assessment Profile: ?Opioid risk tool (ORT):  ? ?  02/28/2021  ?  9:16 AM  ?Opioid Risk   ?Alcohol 0  ?Illegal Drugs 4  ?Rx Drugs 0  ?Psychological Disease 2  ?ADD Negative  ?OCD Negative  ?Bipolar Positive  ?Depression 1  ?Opioid Risk Tool Scoring 7  ?Opioid Risk Interpretation Moderate Risk  ? ? ?ORT Scoring interpretation table:  ?Score <3 = Low Risk for SUD  ?Score between 4-7 = Moderate Risk for SUD  ?Score >8 = High Risk for Opioid Abuse  ? ?Risk of substance use disorder (SUD): Moderate-to-High ? ?Recent Diagnostic Imaging Review  ?Cervical Imaging: ?Cervical MR wo contrast: Results for orders placed during the hospital encounter of 11/30/18 ?MR Cervical Spine Wo Contrast ? ?Narrative ?[facility] ? ?NEUROIMAGING REPORT ? ?STUDY DATE: 11/30/18 ?PATIENT NAME: Autumn Valenzuela ?DOB: 1960/10/11 ?MRN: [id number] ? ?ORDERING CLINICIAN: Antony Contras, MD ?CLINICAL HISTORY: 61 year old female with neck pain. ? ?EXAM: MRI cervical spine (without) ?TECHNIQUE: MRI of the cervical spine was obtained utilizing 3 mm sagittal slices from the posterior fossa down to the T3-4 level with T1, T2 and inversion recovery views. In addition 4 mm axial slices from X3-2 down to T1-2 level were included with T2 and gradient echo views. ?CONTRAST: no ?COMPARISON:  None ?IMAGING SITE: [facility] [address] (1.5 Tesla MRI) ? ?FINDINGS: ? ?On sagittal views the vertebral bodies have  normal height and alignment.  The spinal cord is normal in size and appearance. The posterior fossa, pituitary gland and paraspinal soft tissues are unremarkable. ? ?On axial views there is no spinal stenosis or foraminal narrowing. Limited views of the soft tissues of the head and neck are unremarkable. ? ?Impression ?Normal MRI cervical spine (without). ? ?INTERPRETING PHYSICIAN: ?Penni Bombard, MD ?Certified in Neurology, Neurophysiology and Neuroimaging ? ?[facility] ?[address], [address] ?[place],  [address] ?[phone] ? ?Shoulder Imaging: ?Shoulder-R DG: Results for orders placed during the hospital encounter of 01/24/19 ?DG Shoulder Right ? ?Narrative ?CLINICAL DATA:  Shoulder pain after injury. ? ?EXAM: ?RIGHT SHOULDER - 2+ VIEW ? ?COMPARISON:  None. ? ?FINDINGS: ?AC joint degenerative changes are noted. No fracture or dislocation. ?Mild degenerative changes in the shoulder. ? ?IMPRESSION: ?Mild degenerative changes in the shoulder and AC joint. ? ?Electronically Signed ?By: Dorise Bullion III M.D ?On: 01/24/2019 12:55 ? ?Shoulder-L DG: Results for orders placed during the hospital encounter of 06/16/12 ?DG Shoulder Left ? ?Narrative ?*RADIOLOGY REPORT* ? ?Clinical Data: Left shoulder pain. ? ?LEFT SHOULDER - 2+ VIEW ? ?Comparison: No priors. ? ?Findings: Three views of the left shoulder demonstrate no acute ?displaced fracture, subluxation, dislocation, joint  or soft tissue ?abnormality. ? ?IMPRESSION: ?1.  No acute radiographic abnormality of the left shoulder. ? ?Original Report Authenticated By: Vinnie Langton, M.D. ? ?Lumbosacral Imaging: ?Lumbar MR wo contrast: Results for orders placed during the hospital encounter of 10/05/06 ?MR Lumbar Spine Wo Contrast ? ?Narrative ?Clinical Data: Low back and left hip pain. ?MRI LUMBAR SPINE WITHOUT CONTRAST: ?Technique: Multiplanar and multiecho pulse sequences of the lumbar spine, to include the lower thoracic and upper  sacral regions, were obtained according to standard protocol without IV contrast. ?Findings: Alignment anatomic. Marrow signal homogeneous. No prevertebral or paraspinous mass. Conus medullaris normal. ?L1-2: Normal interspa

## 2021-04-19 ENCOUNTER — Telehealth: Payer: Self-pay

## 2021-04-19 ENCOUNTER — Other Ambulatory Visit: Payer: Self-pay | Admitting: Nurse Practitioner

## 2021-04-19 NOTE — Telephone Encounter (Signed)
Patient called stating she has been unable to get her pain med refill and felt that since Dr. Carlis Abbott did surgery on her so he should prescribe her med. I advised that based off of her initial surgery date and f/u with Dr. Carlis Abbott, our office is unable to fill the medication. She stated that she didn't understand so I attempted to explain that the medication isn't prescribed for a Vascular issue. She became upset and stated "Whatever! He should've prescribed the medication when I saw him!" then d/c the call. ?

## 2021-04-19 NOTE — Telephone Encounter (Signed)
Caller Name: Autumn Valenzuela ?Call back phone #: [phone] ? ?MEDICATION(S): HYDROcodone-acetaminophen (NORCO/VICODIN) 5-325 MG tablet [[id number]]  ? ? ?Days of Med Remaining: none, she is wanting this filled until her pain management app on 05/14/21 ? ?Has the patient contacted their pharmacy (YES/NO)?  yes ?IF YES, when and what did the pharmacy advise? Contact your PCP ?IF NO, request that the patient contact the pharmacy for the refills in the future.  ?           The pharmacy will send an electronic request (except for controlled medications). ? ?Preferred Pharmacy: [facility], [address]  ?[place], [place] [address]  ?Phone:  [phone]  Fax:  [phone]  ?DEA #:  [id number] ? ?~~~Please advise patient/caregiver to allow 2-3 business days to process RX refills. ? ?

## 2021-04-23 NOTE — Telephone Encounter (Signed)
Pt checking on status of this message.  ?Call back phone #: [phone] ?

## 2021-04-24 NOTE — Telephone Encounter (Signed)
PMP database shows last refills on  ? ?03/27/21 Qty 40 ?03/19/21  Qty 16 ?03/09/21 Qty 40 ?

## 2021-04-25 ENCOUNTER — Telehealth: Payer: Self-pay | Admitting: Nurse Practitioner

## 2021-04-25 NOTE — Telephone Encounter (Signed)
Pt stated she saw Autumn Valenzuela recently and discussed a pain medication for her hip. She is in bad pain and would like to have the pain med called in. Please advise. ?

## 2021-04-25 NOTE — Telephone Encounter (Signed)
Informed patient and she states she was informed by the surgeon that she had to contact our office for additional medication because they were no longer going to provide her with this Rx. Pt states she does not have a appointment with pain management until 05/2021 and would like to know if there is any medication she can have called in to help with the pain until she is seen at the pain clinic? Please advise.  ?

## 2021-04-25 NOTE — Telephone Encounter (Signed)
She has appt on the 19th. ?

## 2021-04-25 NOTE — Telephone Encounter (Signed)
Please contact patient and have her schedule a acute appointment with Baldo Ash to discuss pain medication options per [facility].  ?

## 2021-04-27 ENCOUNTER — Telehealth: Payer: Self-pay

## 2021-04-27 NOTE — Progress Notes (Signed)
? ? ?Chronic Care Management ?Pharmacy Assistant  ? ?Name: Autumn Valenzuela  MRN: [id number] DOB: 1961-01-01 ? ?Chart Review for the clinical pharmacist on 05/03/2021 at 1:30 pm (Office visit). ? ?Conditions to be addressed/monitored: ?HTN, HLD, DMII, Bipolar Disorder, Osteoarthritis, and Chronic Migraine, PAD, Chronic Pain syndrome, RLS  ? ?Primary concerns for visit include: ?Medication affordability ?Patient states her Vania Rea causing her to have yeast infection. ?  ? ?Recent office visits:  ?04/02/2021 Vance Peper NP (PCP Office) Start Doxycycline 100 mg 2 times daily ?01/17/2021 Wilfred Lacy NP (PCP) Change Gabapentin 300 mg 2 times daily, Follow up 6 months ?10/30/2020 Wilfred Lacy NP (PCP) Start Hydrocodone- Acetaminophen 5-325 MG PRN ? ?Recent consult visits:  ?04/10/2021  Dr. Burney Gauze MD (Orthopedic Surgery) Start Methylprednisolone 4 mg tablet for 6 days  ?03/13/2021 Herbie Baltimore Dasnoit PA-C (Orthopaedics) No Medication Changes noted ?03/13/2021  Dr. Carlis Abbott MD (Vascular Surgery)No Medication Changes noted ?03/06/2021 Dr. Burney Gauze MD (Orthopedic Surgery)No Medication Changes noted ?02/28/2021 Dr. Dossie Arbour MD (Pain Medicine) No Medication Changes noted ?02/27/2021 Dr. Burney Gauze MD (Orthopedic Surgery)No Medication Changes noted ?02/09/2021 Nettie Elm OTR/L (Occupational Therapy) No Medication Changes noted ?02/07/2021 Dr. Dione Booze MD (Neurology) No medication Changes noted ?02/06/2021 Dr. Carlis Abbott MD (Vascular Surgery) No Medication Changes noted ?01/26/2021 Dr. Burney Gauze MD (Orthopedic Surgery) No Medication Changes noted ?01/24/2021 Dr. Kelton Pillar MD (Endocrinology) Ambulatory referral to Vascular Surgery ?01/23/2021 Dr. Burney Gauze MD (Orthopedic Surgery)No Medication Changes noted ?01/23/2021 Kary Kos MD (Neurosurgery) Unable to see note ?01/19/2021 Dr. Kelton Pillar MD (Endocrinology) No Medication Changes noted ?12/25/2020 Dr. Burney Gauze MD (Orthopedic Surgery) Unable to see note ?12/08/2022DrBurney Gauze MD  (Orthopedic Surgery) Unable to see note ?12/12/2020 Dr. Burney Gauze MD (Orthopedic Surgery) Unable to see note ?12/06/2020 Dr. Burney Gauze MD (Orthopedic Surgery) Unable to see note ?11/20/2020 Dr. Burney Gauze MD (Orthopedic Surgery) No Medication Changes noted ?11/08/2020 Marchia Bond MD (Orthopedic Surgery) Unable to see note ?10/25/202 Kary Kos MD (Neurosurgery) Unable to see note ? ?Hospital visits:  ?Medication Reconciliation was completed by comparing discharge summary, patient?s EMR and Pharmacy list, and upon discussion with patient. ? ?Admitted to the hospital on 02/12/2021 due to PAD. Discharge date was 02/13/2021. Discharged from [facility].   ? ?New?Medications Started at [facility] Discharge:?? ?-started None ID ? ?Medication Changes at Hospital Discharge: ?-Changed None ID ? ?Medications Discontinued at Hospital Discharge: ?-Stopped None ID ? ?Medications that remain the same after Hospital Discharge:??  ?-All other medications will remain the same.   ? ?Admitted to the hospital on 02/08/2021 due to Abdominal Aortogram w/lower Extremity. Discharge date was 02/08/2021. Discharged from [facility]   ? ?New?Medications Started at [facility] Discharge:?? ?-started None ID  ? ?Medication Changes at Hospital Discharge: ?-Changed None ID ? ?Medications Discontinued at Hospital Discharge: ?-Stopped None ID  ? ?Medications that remain the same after Hospital Discharge:??  ?-All other medications will remain the same.   ? 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 Discharge date was 12/02/2020. Discharged from [facility].   ? ?New?Medications Started at [facility] Discharge:?? ?-started None  ? ?Medication Changes at Hospital Discharge: ?-Changed None ? ?Medications Discontinued at Hospital Discharge: ?-Stopped None ? ?Medications that remain the same after Hospital Discharge:??  ?-All other medications will remain the same.    ? ?Admitted to the hospital on 11/06/2020 due to Left arm Pain. Discharge date was 11/06/2020. Discharged from [facility].   ? ?New?Medications Started at [facility] Discharge:?? ?-started Methocarbamol 500 mg 2 times daily ?-  Started Naproxen 500 mg 2 times daily  ? ?Medication Changes at Hospital Discharge: ?-Changed None  ? ?Medications Discontinued at Hospital Discharge: ?-Stopped None ? ?Medications that remain the same after Hospital Discharge:??  ?-All other medications will remain the same.   ? ?Questions for Clinical Pharmacist:  ? ?1.Are you able to connect with Patient Yes ?  ?  ?2.Confirmed appointment date/time with patient/caregiver? Confirm appointment on 05/03/2021 at 1:30 pm with Daron Offer CPP ?  ? ?  ?3.Visit type office ?  ?  ?4.Patient/Caregiver instructed to bring medications to appointment.  ?Patient is aware to bring all medications and supplements to appointment ? ?  ?5.What, if any, problems do you have getting your medications from the pharmacy?  ?Financial barriers,Patient states she is having issue affording her medications. ?  ? ?  ?6.What is your top health concern to discuss at your upcoming visit? ? Patient reports her Vania Rea is causing her to have yeast infection, but she is going to speak with her Endocrinologist about this as well. ?  ?7.Have you seen any other providers since your last visit? No ? Patient denies seeing any other providers. ? ?Medications: ?Outpatient Encounter Medications as of 04/27/2021  ?Medication Sig  ? Accu-Chek FastClix Lancets MISC TEST 4 TIMES DAILY. DX E11.9  ? albuterol (PROVENTIL) (2.5 MG/3ML) 0.083% nebulizer solution Take 2.5 mg by nebulization every 6 (six) hours as needed for wheezing.  ? albuterol (VENTOLIN HFA) 108 (90 Base) MCG/ACT inhaler Inhale 2 puffs into the lungs daily as needed for wheezing or shortness of breath.  ? aspirin EC 81 MG EC tablet Take 1 tablet (81 mg total) by mouth daily at 6 (six) AM. Swallow whole.  ?  Blood Glucose Calibration (ACCU-CHEK GUIDE CONTROL) LIQD 1 each by In Vitro route every 30 (thirty) days. Needs Accu-check guide monitor system. DX. E11.9 (Patient taking differently: 1 each by In Vitro route every 30 (thirty) days. Needs Accu-check guide monitor system. DX. E11.9 2 times daily)  ? blood glucose meter kit and supplies Dispense based on patient and insurance preference. Use up to four times daily as directed. (FOR ICD-10 E10.9, E11.9).  ? calcium-vitamin D (OSCAL WITH D) 500-200 MG-UNIT per tablet Take 1 tablet by mouth daily.  ? Cholecalciferol (VITAMIN D3) 1.25 MG (50000 UT) CAPS Take 1 capsule by mouth once a week.  ? diclofenac Sodium (VOLTAREN) 1 % GEL Apply 2 g topically 4 (four) times daily. (Patient taking differently: Apply 2 g topically 4 (four) times daily as needed (pain).)  ? doxycycline (VIBRA-TABS) 100 MG tablet Take 1 tablet (100 mg total) by mouth 2 (two) times daily.  ? DULoxetine (CYMBALTA) 60 MG capsule Take 1 capsule (60 mg total) by mouth at bedtime.  ? empagliflozin (JARDIANCE) 25 MG TABS tablet Take 1 tablet (25 mg total) by mouth daily before breakfast.  ? ferrous sulfate 325 (65 FE) MG tablet Take 1 tablet (325 mg total) by mouth every other day. (Patient taking differently: Take 325 mg by mouth daily.)  ? fluticasone (FLONASE) 50 MCG/ACT nasal spray Place 2 sprays into both nostrils daily as needed for allergies.  ? gabapentin (NEURONTIN) 300 MG capsule TAKE 1 CAPSULE BY MOUTH TWICE DAILY  ? HYDROcodone-acetaminophen (NORCO/VICODIN) 5-325 MG tablet Take 1 tablet by mouth every 6 (six) hours as needed for moderate pain.  ? insulin aspart (NOVOLOG FLEXPEN) 100 UNIT/ML FlexPen Max daily 30 units (Patient taking differently: Inject 1-4 Units into the skin 3 (three) times daily as needed (  blood sugar over 150). Max daily 30 units)  ? Insulin Glargine (BASAGLAR KWIKPEN) 100 UNIT/ML Inject 26 Units into the skin at bedtime.  ? Insulin Pen Needle 31G X 5 MM MISC 1 Units by Does not  apply route in the morning, at noon, in the evening, and at bedtime.  ? metFORMIN (GLUCOPHAGE-XR) 500 MG 24 hr tablet Take 1 tablet (500 mg total) by mouth in the morning and at bedtime.  ? ONETOUCH VERIO test strip CHE

## 2021-05-02 ENCOUNTER — Encounter: Payer: Self-pay | Admitting: Nurse Practitioner

## 2021-05-02 ENCOUNTER — Ambulatory Visit (INDEPENDENT_AMBULATORY_CARE_PROVIDER_SITE_OTHER): Payer: Medicare HMO | Admitting: Nurse Practitioner

## 2021-05-02 VITALS — BP 118/82 | HR 99 | Temp 97.3°F | Ht 61.0 in | Wt 207.4 lb

## 2021-05-02 DIAGNOSIS — G2581 Restless legs syndrome: Secondary | ICD-10-CM | POA: Diagnosis not present

## 2021-05-02 DIAGNOSIS — E1149 Type 2 diabetes mellitus with other diabetic neurological complication: Secondary | ICD-10-CM | POA: Diagnosis not present

## 2021-05-02 DIAGNOSIS — E1140 Type 2 diabetes mellitus with diabetic neuropathy, unspecified: Secondary | ICD-10-CM

## 2021-05-02 DIAGNOSIS — G8929 Other chronic pain: Secondary | ICD-10-CM

## 2021-05-02 DIAGNOSIS — F3160 Bipolar disorder, current episode mixed, unspecified: Secondary | ICD-10-CM

## 2021-05-02 DIAGNOSIS — R1032 Left lower quadrant pain: Secondary | ICD-10-CM

## 2021-05-02 DIAGNOSIS — R590 Localized enlarged lymph nodes: Secondary | ICD-10-CM

## 2021-05-02 DIAGNOSIS — R69 Illness, unspecified: Secondary | ICD-10-CM | POA: Diagnosis not present

## 2021-05-02 DIAGNOSIS — M25551 Pain in right hip: Secondary | ICD-10-CM | POA: Diagnosis not present

## 2021-05-02 DIAGNOSIS — M25552 Pain in left hip: Secondary | ICD-10-CM | POA: Diagnosis not present

## 2021-05-02 LAB — CBC WITH DIFFERENTIAL/PLATELET
Basophils Absolute: 0 10*3/uL (ref 0.0–0.1)
Basophils Relative: 0.2 % (ref 0.0–3.0)
Eosinophils Absolute: 0.3 10*3/uL (ref 0.0–0.7)
Eosinophils Relative: 2.8 % (ref 0.0–5.0)
HCT: 40.3 % (ref 36.0–46.0)
Hemoglobin: 12.9 g/dL (ref 12.0–15.0)
Lymphocytes Relative: 24 % (ref 12.0–46.0)
Lymphs Abs: 2.6 10*3/uL (ref 0.7–4.0)
MCHC: 32 g/dL (ref 30.0–36.0)
MCV: 85.3 fl (ref 78.0–100.0)
Monocytes Absolute: 0.6 10*3/uL (ref 0.1–1.0)
Monocytes Relative: 6 % (ref 3.0–12.0)
Neutro Abs: 7.2 10*3/uL (ref 1.4–7.7)
Neutrophils Relative %: 67 % (ref 43.0–77.0)
Platelets: 224 10*3/uL (ref 150.0–400.0)
RBC: 4.73 Mil/uL (ref 3.87–5.11)
RDW: 16.5 % — ABNORMAL HIGH (ref 11.5–15.5)
WBC: 10.7 10*3/uL — ABNORMAL HIGH (ref 4.0–10.5)

## 2021-05-02 MED ORDER — BUPROPION HCL 75 MG PO TABS: 75.0000 mg | ORAL_TABLET | Freq: Two times a day (BID) | ORAL | 5 refills | Status: DC

## 2021-05-02 MED ORDER — GABAPENTIN 300 MG PO CAPS: 300.0000 mg | ORAL_CAPSULE | Freq: Three times a day (TID) | ORAL | 3 refills | Status: DC

## 2021-05-02 NOTE — Patient Instructions (Signed)
Increase gabapentin to '300mg'$  TID ?Maintain cymbalta dose ?Add wellbutrin '75mg'$  BID ?You will be contacted to schedule appt with psychology ?Maintain appt with pain clinic. ?Go to lab for urine drug screen and blood draw ?Will review drug screen prior to additional hydrocodone refill ?

## 2021-05-02 NOTE — Progress Notes (Signed)
? ?             Established Patient Visit ? ?Patient: Autumn Valenzuela   DOB: [birth year]   61 y.o. Female  MRN: [id number] ?Visit Date: 05/08/2021 ? ?Subjective:  ?  ?Chief Complaint  ?Patient presents with  ? Follow-up  ?  2 week f/u on leg pain. Pt states there is no improvement in her leg pain.  ?Pt states she received vaccines (Tdap and Shingles)at [facility] and will try to get records to [place].   ? ?HPI ?Chronic groin pain (Left) ?Reports persistent left groin pain, describes as fullness in groin and thigh region. ?Minimal relief with NSAIDs and tylenol ?Moderate relief with hydrocodone but has been out of med x 1week ?S/p left common endarterectomy with bovine patch on 02/12/2021. ?Completed doxycycline x 10days. ?Ct ABD/pelvis done 03/15/2021:  Stranding in the left groin subcutaneous soft tissues with mildly prominent left inguinal lymph nodes. The largest node has a short axis diameter of 10 mm.Favor cellulitis with reactive Adenitis. No acute intra-abdominal abnormality. S/p hysterectomy, no adnexal mass. ?No indication of LE swelling or cellulitis. ?Repeat pelvic US and cbc ? ?Pelvic US 05/07/2021: Prominent left inguinal lymph nodes.The largest lymph node measures 7 mm short axis. ? ?  Latest Ref Rng & Units 05/02/2021  ? 10:10 AM 04/02/2021  ?  3:37 PM 02/13/2021  ?  2:50 AM  ?CBC  ?WBC 4.0 - 10.5 K/uL 10.7   14.5   12.7    ?Hemoglobin 12.0 - 15.0 g/dL 12.9   12.4   10.5    ?Hematocrit 36.0 - 46.0 % 40.3   38.9   32.4    ?Platelets 150.0 - 400.0 K/uL 224.0   229.0   197    ? ?Check UDS ?Refilled hydrocodone, additional refills from pain clinic(upcoming appt 05/14/2021) ?Referred to gen. Surgery for lymph node biopsy. ? ?Reviewed medical, surgical, and social history today ? ?Medications: ?Outpatient Medications Prior to Visit  ?Medication Sig  ? Accu-Chek FastClix Lancets MISC TEST 4 TIMES DAILY. DX E11.9  ? albuterol (PROVENTIL) (2.5 MG/3ML) 0.083% nebulizer solution Take 2.5 mg by nebulization every 6 (six)  hours as needed for wheezing.  ? albuterol (VENTOLIN HFA) 108 (90 Base) MCG/ACT inhaler Inhale 2 puffs into the lungs daily as needed for wheezing or shortness of breath.  ? aspirin EC 81 MG EC tablet Take 1 tablet (81 mg total) by mouth daily at 6 (six) AM. Swallow whole.  ? Blood Glucose Calibration (ACCU-CHEK GUIDE CONTROL) LIQD 1 each by In Vitro route every 30 (thirty) days. Needs Accu-check guide monitor system. DX. E11.9 (Patient taking differently: 1 each by In Vitro route every 30 (thirty) days. Needs Accu-check guide monitor system. DX. E11.9 2 times daily)  ? blood glucose meter kit and supplies Dispense based on patient and insurance preference. Use up to four times daily as directed. (FOR ICD-10 E10.9, E11.9).  ? Cholecalciferol (VITAMIN D3) 1.25 MG (50000 UT) CAPS Take 1 capsule by mouth once a week.  ? diclofenac Sodium (VOLTAREN) 1 % GEL Apply 2 g topically 4 (four) times daily. (Patient taking differently: Apply 2 g topically 4 (four) times daily as needed (pain).)  ? DULoxetine (CYMBALTA) 60 MG capsule Take 1 capsule (60 mg total) by mouth at bedtime.  ? ferrous sulfate 325 (65 FE) MG tablet Take 1 tablet (325 mg total) by mouth every other day. (Patient taking differently: Take 325 mg by mouth daily.)  ? fluticasone (FLONASE) 50 MCG/ACT nasal spray  Place 2 sprays into both nostrils daily as needed for allergies.  ? insulin aspart (NOVOLOG FLEXPEN) 100 UNIT/ML FlexPen Max daily 30 units (Patient taking differently: Inject 1-4 Units into the skin 3 (three) times daily as needed (blood sugar over 150). Max daily 30 units)  ? Insulin Glargine (BASAGLAR KWIKPEN) 100 UNIT/ML Inject 26 Units into the skin at bedtime.  ? Insulin Pen Needle 31G X 5 MM MISC 1 Units by Does not apply route in the morning, at noon, in the evening, and at bedtime.  ? metFORMIN (GLUCOPHAGE-XR) 500 MG 24 hr tablet Take 1 tablet (500 mg total) by mouth in the morning and at bedtime.  ? ONETOUCH VERIO test strip CHECK UP TO 4  TIMES A DAY AS DIRECTED  ? rosuvastatin (CRESTOR) 20 MG tablet TAKE 1 TABLET(20 MG) BY MOUTH DAILY  ? [DISCONTINUED] calcium-vitamin D (OSCAL WITH D) 500-200 MG-UNIT per tablet Take 1 tablet by mouth daily.  ? [DISCONTINUED] doxycycline (VIBRA-TABS) 100 MG tablet Take 1 tablet (100 mg total) by mouth 2 (two) times daily.  ? [DISCONTINUED] empagliflozin (JARDIANCE) 25 MG TABS tablet Take 1 tablet (25 mg total) by mouth daily before breakfast.  ? [DISCONTINUED] gabapentin (NEURONTIN) 300 MG capsule TAKE 1 CAPSULE BY MOUTH TWICE DAILY  ? [DISCONTINUED] HYDROcodone-acetaminophen (NORCO/VICODIN) 5-325 MG tablet Take 1 tablet by mouth every 6 (six) hours as needed for moderate pain. (Patient not taking: Reported on 05/02/2021)  ? ?No facility-administered medications prior to visit.  ? ?Reviewed past medical and social history.  ? ?ROS per HPI above ? ? ?   ?Objective:  ?BP 118/82 (BP Location: Left Arm, Patient Position: Sitting, Cuff Size: Large)   Pulse 99   Temp (!) 97.3 ?F (36.3 ?C) (Temporal)   Ht _0  (1.549 m)   Wt 207 lb 6.4 oz (94.1 kg)   SpO2 99%   BMI 39.19 kg/m?  ? ?  ? ?Physical Exam ?Vitals reviewed.  ?Cardiovascular:  ?   Rate and Rhythm: Normal rate.  ?   Pulses: Normal pulses.  ?Abdominal:  ?   Hernia: There is no hernia in the left inguinal area or right inguinal area.  ?Musculoskeletal:     ?   General: Tenderness present.  ?   Right hip: Tenderness present. No bony tenderness or crepitus. Normal range of motion. Normal strength.  ?   Left hip: Tenderness present. No bony tenderness or crepitus. Normal range of motion. Normal strength.  ?   Right upper leg: No tenderness or bony tenderness.  ?   Left upper leg: Tenderness present. No swelling, edema or bony tenderness.  ?   Right knee: Normal.  ?   Left knee: Normal.  ?   Right lower leg: Normal. No edema.  ?   Left lower leg: Normal. No edema.  ?   Right ankle: Normal.  ?   Left ankle: Normal.  ?Lymphadenopathy:  ?   Lower Body: No right  inguinal adenopathy. No left inguinal adenopathy.  ?Neurological:  ?   Mental Status: She is alert and oriented to person, place, and time.  ?  ?Results for orders placed or performed in visit on 05/02/21  ?CBC with Differential/Platelet  ?Result Value Ref Range  ? WBC 10.7 (H) 4.0 - 10.5 K/uL  ? RBC 4.73 3.87 - 5.11 Mil/uL  ? Hemoglobin 12.9 12.0 - 15.0 g/dL  ? HCT 40.3 36.0 - 46.0 %  ? MCV 85.3 78.0 - 100.0 fl  ? MCHC 32.0 30.0 -  36.0 g/dL  ? RDW 16.5 (H) 11.5 - 15.5 %  ? Platelets 224.0 150.0 - 400.0 K/uL  ? Neutrophils Relative % 67.0 43.0 - 77.0 %  ? Lymphocytes Relative 24.0 12.0 - 46.0 %  ? Monocytes Relative 6.0 3.0 - 12.0 %  ? Eosinophils Relative 2.8 0.0 - 5.0 %  ? Basophils Relative 0.2 0.0 - 3.0 %  ? Neutro Abs 7.2 1.4 - 7.7 K/uL  ? Lymphs Abs 2.6 0.7 - 4.0 K/uL  ? Monocytes Absolute 0.6 0.1 - 1.0 K/uL  ? Eosinophils Absolute 0.3 0.0 - 0.7 K/uL  ? Basophils Absolute 0.0 0.0 - 0.1 K/uL  ?DRUG MONITORING, PANEL 8 WITH CONFIRMATION, URINE  ?Result Value Ref Range  ? Alcohol Metabolites POSITIVE (A) <500 ng/mL  ? Ethyl Glucuronide (ETG) 1,851 (H) <500 ng/mL  ? Ethyl Sulfate (ETS) 238 (H) <100 ng/mL  ? Alcohol Metab Comments    ? Amphetamines NEGATIVE <500 ng/mL  ? Benzodiazepines NEGATIVE <100 ng/mL  ? Buprenorphine, Urine NEGATIVE <5 ng/mL  ? Cocaine Metabolite NEGATIVE <150 ng/mL  ? 6 Acetylmorphine NEGATIVE <10 ng/mL  ? Marijuana Metabolite POSITIVE (A) <20 ng/mL  ? Marijuana Metabolite 3,800 (H) <5 ng/mL  ? Marijuana Comments    ? MDMA NEGATIVE <500 ng/mL  ? Opiates NEGATIVE <100 ng/mL  ? Oxycodone NEGATIVE <100 ng/mL  ? Creatinine 80.0 > or = 20.0 mg/dL  ? pH 5.7 4.5 - 9.0  ? Oxidant NEGATIVE <200 mcg/mL  ?DM TEMPLATE  ?Result Value Ref Range  ? Notes and Comments    ? ?   ?Assessment & Plan:  ?  ?Problem List Items Addressed This Visit   ? ?  ? Endocrine  ? Diabetic neuropathy with neurologic complication ([place])  ? Relevant Medications  ? gabapentin (NEURONTIN) 300 MG capsule  ?  ? Other  ? Chronic  groin pain (Left) (Chronic)  ?  Reports persistent left groin pain, describes as fullness in groin and thigh region. ?Minimal relief with NSAIDs and tylenol ?Moderate relief with hydrocodone but has been out of med x 1week ?S

## 2021-05-03 ENCOUNTER — Ambulatory Visit: Payer: Medicare HMO

## 2021-05-03 DIAGNOSIS — I739 Peripheral vascular disease, unspecified: Secondary | ICD-10-CM

## 2021-05-03 DIAGNOSIS — F3170 Bipolar disorder, currently in remission, most recent episode unspecified: Secondary | ICD-10-CM

## 2021-05-03 DIAGNOSIS — E782 Mixed hyperlipidemia: Secondary | ICD-10-CM

## 2021-05-03 DIAGNOSIS — E1142 Type 2 diabetes mellitus with diabetic polyneuropathy: Secondary | ICD-10-CM

## 2021-05-03 DIAGNOSIS — G894 Chronic pain syndrome: Secondary | ICD-10-CM

## 2021-05-03 NOTE — Patient Instructions (Addendum)
Visit Information ?It was great speaking with you today!  Please let me know if you have any questions about our visit. ? ? Goals Addressed   ? ?  ?  ?  ?  ? This Visit's Progress  ?  Monitor and Manage My Blood Sugar-Diabetes Type 2     ?  Timeframe:  Long-Range Goal ?Priority:  High ?Start Date: 05/03/21                            ?Expected End Date: 05/04/22                     ? ?Follow Up within 30 days ?  ?- check blood sugar four times daily, before meals and at bedtime ?- check blood sugar before and after exercise ?- check blood sugar if I feel it is too high or too low ?- take the blood sugar log to all doctor visits  ?  ?Why is this important?   ?Checking your blood sugar at home helps to keep it from getting very high or very low.  ?Writing the results in a diary or log helps the doctor know how to care for you.  ?Your blood sugar log should have the time, date and the results.  ?Also, write down the amount of insulin or other medicine that you take.  ?Other information, like what you ate, exercise done and how you were feeling, will also be helpful.   ?  ?Notes:  ?  ? ?  ? ? ?Patient Care Plan: General Pharmacy (Adult)  ?  ? ?Problem Identified: Hypertension, Hyperlipidemia, Diabetes, and Bipolar Disorder, Chronic Pain   ?Priority: High  ?  ? ?Long-Range Goal: Patient-Specific Goal   ?Start Date: 05/03/2021  ?Expected End Date: 05/04/2022  ?This Visit's Progress: On track  ?Priority: High  ?Note:   ?Current Barriers:  ?Unable to achieve control of diabetes  ? ?Pharmacist Clinical Goal(s):  ?Patient will achieve control of diabetes as evidenced by A1c less than 7% through collaboration with PharmD and provider.  ? ?Interventions: ?1:1 collaboration with Nche, Charlene Brooke, NP regarding development and update of comprehensive plan of care as evidenced by provider attestation and co-signature ?Inter-disciplinary care team collaboration (see longitudinal plan of care) ?Comprehensive medication review  performed; medication list updated in electronic medical record ? ?Hyperlipidemia: (LDL goal < 70) ?-Uncontrolled ?-left common femoral endarterectomy Jan 2023 ?-Current treatment: ?Rosuvastatin 20 mg daily: Appropriate, Query effective ?-Current treatment: ?Aspirin 81 mg daily: Appropriate, Effective, Safe, Accessible  ?-Medications previously tried: NA  ?-Recommend increasing rosuvastatin to 40 mg daily, will defer given other recent medication changes in lieu of dietary changes.  ? ?Diabetes (A1c goal <7%) ?-Uncontrolled ?-Managed by Dr. Kelton Pillar; next follow-up in May.  ?-Current medications: ?Novolog 1-4 units three times daily with meals: Appropriate, Query effective  ?Basaglar 26 units nightly: Appropriate, Query effective  ?Metformin XR 500 mg twice daily: Appropriate, Query effective  ?-Medications previously tried: Glipizide, Jardiance   ?-Diarrhea with higher doses of metformin  ?-Frequent yeast infections with Jardiance ?-Current home glucose readings ?fasting glucose: Challenges with using traditional meter, error with test strips. Did not bring meter in today to evaluate monitoring technique.  ?-Denies hypoglycemic/hyperglycemic symptoms ?-Patient candidate for CGM, likely would qualify if ordered through Medicare part B DME supplier.  ?-STOP Jardiance (Yeast infections) ?-Recommend starting Ozempic 0.25 mg weekly; patient to discuss with endocrinology at next follow-up. ? ?Bipolar Disorder (Goal:  Maintain symptom remission) ?-Not ideally controlled ?-Current treatment: ?Bupropinon 75 mg twice daily: Appropriate, Effective, Safe, Accessible ?Duloxetine 60 mg nightly: Appropriate, Effective, Safe, Accessible  ?-Medications previously tried/failed: NA ?-Recent referral to psychiatry placed  ?-Recommended to continue current medication ? ?Chronic Pain (Goal: Minimize symptoms) ?-Controlled ?-Current treatment  ?Duloxetine 60 mg nightly: Appropriate, Effective, Safe, Accessible ?Gabapentin 300 mg three  times daily: Appropriate, Effective, Safe, Accessible ?-Medications previously tried: NA   ?-Patient waiting for refill of hydrocodone by PCP.  ?-Discussed risks of CNS depression, staying alert prior to driving.  ?-Recommended to continue current medication ? ?Patient Goals/Self-Care Activities ?Patient will:  ?- check glucose 3-4 times daily, document, and provide at future appointments ? ?Follow Up Plan: Telephone follow up appointment with care management team member scheduled for:  06/28/2021 at 9:15 AM ?  ? ?Ms. Debenedetto was given information about Chronic Care Management services today including:  ?CCM service includes personalized support from designated clinical staff supervised by her physician, including individualized plan of care and coordination with other care providers ?24/7 contact phone numbers for assistance for urgent and routine care needs. ?Standard insurance, coinsurance, copays and deductibles apply for chronic care management only during months in which we provide at least 20 minutes of these services. Most insurances cover these services at 100%, however patients may be responsible for any copay, coinsurance and/or deductible if applicable. This service may help you avoid the need for more expensive face-to-face services. ?Only one practitioner may furnish and bill the service in a calendar month. ?The patient may stop CCM services at any time (effective at the end of the month) by phone call to the office staff. ? ?Patient agreed to services and verbal consent obtained.  ? ?The patient verbalized understanding of instructions, educational materials, and care plan provided today and declined offer to receive copy of patient instructions, educational materials, and care plan.  ? ?Junius Argyle, PharmD, BCACP, CPP ?Clinical Pharmacist Practitioner  ?[facility]  ?[phone] ?  ?  ?

## 2021-05-03 NOTE — Progress Notes (Signed)
? ?Chronic Care Management ?Pharmacy Note ? ?05/03/2021 ?Name:  Autumn Valenzuela MRN:  [id number] DOB:  November 19, 1960 ? ?Summary: ?Patient presents for initial CCM consult.  ? ?-Frequent yeast infections with Jardiance. Challenges with using traditional meter, error with test strips. Did not bring meter in today to evaluate monitoring technique.  ? ?-Patient waiting for refill of hydrocodone by PCP.  ? ?Recommendations/Changes made from today's visit: ?-STOP Jardiance (Yeast infections) ? ?-Recommend starting Ozempic 0.25 mg weekly; patient to discuss with endocrinology at next follow-up. ? ?-Recommend increasing rosuvastatin to 40 mg daily, will defer given other recent medication changes in lieu of dietary changes.  ? ?Plan: ?CPP follow-up one month ? ? ?Subjective: ?Autumn Valenzuela is an 61 y.o. year old female who is a primary patient of Nche, Charlene Brooke, NP.  The CCM team was consulted for assistance with disease management and care coordination needs.   ? ?Engaged with patient by telephone for initial visit in response to provider referral for pharmacy case management and/or care coordination services.  ? ?Consent to Services:  ?The patient was given the following information about Chronic Care Management services today, agreed to services, and gave verbal consent: 1. CCM service includes personalized support from designated clinical staff supervised by the primary care provider, including individualized plan of care and coordination with other care providers 2. 24/7 contact phone numbers for assistance for urgent and routine care needs. 3. Service will only be billed when office clinical staff spend 20 minutes or more in a month to coordinate care. 4. Only one practitioner may furnish and bill the service in a calendar month. 5.The patient may stop CCM services at any time (effective at the end of the month) by phone call to the office staff. 6. The patient will be responsible for cost sharing (co-pay) of up to  20% of the service fee (after annual deductible is met). Patient agreed to services and consent obtained. ? ?Patient Care Team: ?Nche, Charlene Brooke, NP as PCP - General (Internal Medicine) ?Germaine Pomfret, [facility] as Pharmacist (Pharmacist) ? ?Recent office visits: ?05/02/21: Patient presented to Wilfred Lacy, NP for chronic pain. Gabapentin 300 mg TID, Bupropion 75 mg twice daily.  ?04/02/2021 Vance Peper NP (PCP Office) Start Doxycycline 100 mg 2 times daily ?01/17/2021 Wilfred Lacy NP (PCP) Change Gabapentin 300 mg 2 times daily, Follow up 6 months ?10/30/2020 Wilfred Lacy NP (PCP) Start Hydrocodone- Acetaminophen 5-325 MG PRN ? ?Recent consult visits: ?04/10/2021  Dr. Burney Gauze MD (Orthopedic Surgery) Start Methylprednisolone 4 mg tablet for 6 days  ?03/13/2021  Dr. Carlis Abbott MD (Vascular Surgery)No Medication Changes noted ?02/28/2021 Dr. Dossie Arbour MD (Pain Medicine) No Medication Changes noted ?02/07/2021 Dr. Dione Booze MD (Neurology) No medication Changes noted ?01/24/2021 Dr. Kelton Pillar MD (Endocrinology) Ambulatory referral to Vascular Surgery ?01/23/2021 Kary Kos MD (Neurosurgery) Unable to see note ?01/19/2021 Dr. Kelton Pillar MD (Endocrinology) No Medication Changes noted ? ?Hospital visits: ?Admitted to the hospital on 02/12/2021 due to PAD. Discharge date was 02/13/2021. Discharged from [facility].   ? ?Admitted to the hospital on 02/08/2021 due to Abdominal Aortogram w/lower Extremity. Discharge date was 02/08/2021. Discharged from [facility].   ? 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 Discharge date was 12/02/2020. Discharged from [facility].   ? ?Admitted to the hospital on 11/06/2020 due to Left arm Pain. Discharge date was 11/06/2020. Discharged from [facility].   ? ?Objective: ? ?Lab Results  ?Component Value  Date  ? CREATININE 0.65 02/13/2021  ? BUN 8 02/13/2021  ? GFR 95.70 01/17/2021  ?  GFRNONAA >60 02/13/2021  ? GFRAA >60 02/09/2018  ? NA 139 02/13/2021  ? K 3.7 02/13/2021  ? CALCIUM 8.6 (L) 02/13/2021  ? CO2 23 02/13/2021  ? GLUCOSE 148 (H) 02/13/2021  ? ? ?Lab Results  ?Component Value Date/Time  ? HGBA1C 8.7 (H) 02/12/2021 01:14 PM  ? HGBA1C 7.9 (H) 11/28/2020 09:30 AM  ? GFR 95.70 01/17/2021 08:36 AM  ? GFR 92.44 09/05/2020 09:39 AM  ? MICROALBUR 1.4 01/17/2021 08:36 AM  ? MICROALBUR <0.7 09/22/2019 01:27 PM  ?  ?Last diabetic Eye exam:  ?Lab Results  ?Component Value Date/Time  ? HMDIABEYEEXA No Retinopathy 06/14/2020 12:00 AM  ?  ?Last diabetic Foot exam: No results found for: HMDIABFOOTEX  ? ?Lab Results  ?Component Value Date  ? CHOL 175 02/13/2021  ? HDL 37 (L) 02/13/2021  ? LDLCALC 123 (H) 02/13/2021  ? TRIG 75 02/13/2021  ? CHOLHDL 4.7 02/13/2021  ? ? ? ?  Latest Ref Rng & Units 02/12/2021  ?  8:21 AM 01/17/2021  ?  8:36 AM 05/27/2020  ? 10:30 AM  ?Hepatic Function  ?Total Protein 6.5 - 8.1 g/dL 6.6   7.1   7.0    ?Albumin 3.5 - 5.0 g/dL 3.4   4.0   3.6    ?AST 15 - 41 U/L '10   10   11    ' ?ALT 0 - 44 U/L '10   9   9    ' ?Alk Phosphatase 38 - 126 U/L 57   63   60    ?Total Bilirubin 0.3 - 1.2 mg/dL 0.3   0.4   0.4    ?Bilirubin, Direct 0.0 - 0.3 mg/dL  0.1     ? ? ?Lab Results  ?Component Value Date/Time  ? TSH 0.51 02/04/2019 02:45 PM  ? TSH 0.43 04/30/2017 09:02 AM  ? FREET4 1.08 03/24/2016 11:59 AM  ? ? ? ?  Latest Ref Rng & Units 05/02/2021  ? 10:10 AM 04/02/2021  ?  3:37 PM 02/13/2021  ?  2:50 AM  ?CBC  ?WBC 4.0 - 10.5 K/uL 10.7   14.5   12.7    ?Hemoglobin 12.0 - 15.0 g/dL 12.9   12.4   10.5    ?Hematocrit 36.0 - 46.0 % 40.3   38.9   32.4    ?Platelets 150.0 - 400.0 K/uL 224.0   229.0   197    ? ? ?Lab Results  ?Component Value Date/Time  ? VD25OH 31.23 01/17/2021 08:36 AM  ? VD25OH 47.98 09/22/2019 01:27 PM  ? ? ?Clinical ASCVD: No  ?The 10-year ASCVD risk score (Arnett DK, et al., 2019) is: 9.3% ?  Values used to calculate the score: ?    Age: 61 years ?    Sex: Female ?    Is  Non-Hispanic African American: Yes ?    Diabetic: Yes ?    Tobacco smoker: No ?    Systolic Blood Pressure: 124 mmHg ?    Is BP treated: No ?    HDL Cholesterol: 37 mg/dL ?    Total Cholesterol: 175 mg/dL   ? ? ?  05/02/2021  ? 10:17 AM 10/24/2020  ?  8:51 AM 09/22/2019  ?  3:26 PM  ?Depression screen PHQ 2/9  ?Decreased Interest '2 1 1  ' ?Down, Depressed, Hopeless '2 2 1  ' ?PHQ - 2 Score '4 3 2  ' ?  Altered sleeping 3 3 0  ?Tired, decreased energy '3 2 1  ' ?Change in appetite 3  2  ?Feeling bad or failure about yourself  '3 1 1  ' ?Trouble concentrating 2 0 1  ?Moving slowly or fidgety/restless 3 0 1  ?Suicidal thoughts 3 0 2  ?PHQ-9 Score '24 9 10  ' ?Difficult doing work/chores Somewhat difficult Somewhat difficult Somewhat difficult  ?  ? ?Social History  ? ?Tobacco Use  ?Smoking Status Former  ? Types: Cigarettes  ? Quit date: 12/13/2015  ? Years since quitting: 5.3  ?Smokeless Tobacco Never  ? ?BP Readings from Last 3 Encounters:  ?05/02/21 118/82  ?04/02/21 (!) 134/95  ?03/13/21 103/72  ? ?Pulse Readings from Last 3 Encounters:  ?05/02/21 99  ?04/02/21 83  ?03/13/21 77  ? ?Wt Readings from Last 3 Encounters:  ?05/02/21 207 lb 6.4 oz (94.1 kg)  ?04/02/21 207 lb (93.9 kg)  ?03/13/21 203 lb (92.1 kg)  ? ?BMI Readings from Last 3 Encounters:  ?05/02/21 39.19 kg/m?  ?04/02/21 39.11 kg/m?  ?03/13/21 38.36 kg/m?  ? ? ?Assessment/Interventions: Review of patient past medical history, allergies, medications, health status, including review of consultants reports, laboratory and other test data, was performed as part of comprehensive evaluation and provision of chronic care management services.  ? ?SDOH:  (Social Determinants of Health) assessments and interventions performed: Yes ?SDOH Interventions   ? ?Flowsheet Row Most Recent Value  ?SDOH Interventions   ?Financial Strain Interventions Intervention Not Indicated  ? ?  ? ?SDOH Screenings  ? ?Alcohol Screen: Low Risk   ? Last Alcohol Screening Score (AUDIT): 0  ?Depression (PHQ2-9):  Medium Risk  ? PHQ-2 Score: 24  ?Financial Resource Strain: Low Risk   ? Difficulty of Paying Living Expenses: Not hard at all  ?Food Insecurity: No Food Insecurity  ? Worried About Charity fundraiser in the Last Year:

## 2021-05-05 LAB — DRUG MONITORING, PANEL 8 WITH CONFIRMATION, URINE
6 Acetylmorphine: NEGATIVE ng/mL (ref <10)
Alcohol Metabolites: POSITIVE ng/mL — AB (ref <500)
Amphetamines: NEGATIVE ng/mL (ref <500)
Benzodiazepines: NEGATIVE ng/mL (ref <100)
Buprenorphine, Urine: NEGATIVE ng/mL (ref <5)
Cocaine Metabolite: NEGATIVE ng/mL (ref <150)
Creatinine: 80 mg/dL (ref >=20.0)
Ethyl Glucuronide (ETG): 1851 ng/mL — ABNORMAL HIGH (ref <500)
Ethyl Sulfate (ETS): 238 ng/mL — ABNORMAL HIGH (ref <100)
MDMA: NEGATIVE ng/mL (ref <500)
Marijuana Metabolite: 3800 ng/mL — ABNORMAL HIGH (ref <5)
Marijuana Metabolite: POSITIVE ng/mL — AB (ref <20)
Opiates: NEGATIVE ng/mL (ref <100)
Oxidant: NEGATIVE ug/mL (ref <200)
Oxycodone: NEGATIVE ng/mL (ref <100)
pH: 5.7 (ref 4.5–9.0)

## 2021-05-05 LAB — DM TEMPLATE

## 2021-05-06 MED ORDER — HYDROCODONE-ACETAMINOPHEN 5-325 MG PO TABS: 1.0000 | ORAL_TABLET | Freq: Two times a day (BID) | ORAL | 0 refills | Status: DC | PRN

## 2021-05-07 ENCOUNTER — Ambulatory Visit
Admission: RE | Admit: 2021-05-07 | Discharge: 2021-05-07 | Disposition: A | Discharge status: Home / Self Care | Payer: Medicare HMO | Source: Ambulatory Visit | Attending: Nurse Practitioner | Admitting: Nurse Practitioner

## 2021-05-07 ENCOUNTER — Telehealth: Payer: Self-pay | Admitting: Nurse Practitioner

## 2021-05-07 DIAGNOSIS — R590 Localized enlarged lymph nodes: Secondary | ICD-10-CM

## 2021-05-07 DIAGNOSIS — R599 Enlarged lymph nodes, unspecified: Secondary | ICD-10-CM | POA: Diagnosis not present

## 2021-05-07 NOTE — Telephone Encounter (Signed)
Ms. Wilkowski is upset because she did not get pain medication prescription and had to wait over the weekend without medication. ?I notified her about the UDS results and sent hydrocodone rx. I explained our controlled substance policy and need for UDS. I informed her that pain medication will be provided only till upcoming appt with pain clinic (05/14/2021). Advised to refrain from ETOH and marijuana use. ?She verbalized understanding. ?

## 2021-05-08 ENCOUNTER — Encounter: Payer: Self-pay | Admitting: Nurse Practitioner

## 2021-05-08 ENCOUNTER — Telehealth: Payer: Self-pay | Admitting: Nurse Practitioner

## 2021-05-08 NOTE — Telephone Encounter (Signed)
Notified about pelvic US results and need for lymph node biopsy. She verbalized understanding and agreed to referral to surgeon. ?

## 2021-05-08 NOTE — Assessment & Plan Note (Addendum)
Reports persistent left groin pain, describes as fullness in groin and thigh region. ?Minimal relief with NSAIDs and tylenol ?Moderate relief with hydrocodone but has been out of med x 1week ?S/p left common endarterectomy with bovine patch on 02/12/2021. ?Completed doxycycline x 10days. ?Ct ABD/pelvis done 03/15/2021:  Stranding in the left groin subcutaneous soft tissues with mildly prominent left inguinal lymph nodes. The largest node has a short axis diameter of 10 mm.Favor cellulitis with reactive Adenitis. No acute intra-abdominal abnormality. S/p hysterectomy, no adnexal mass. ?No indication of LE swelling or cellulitis. ?Repeat pelvic US and cbc ? ?Pelvic US 05/07/2021: Prominent left inguinal lymph nodes.The largest lymph node measures 7 mm short axis. ? ?  Latest Ref Rng & Units 05/02/2021  ? 10:10 AM 04/02/2021  ?  3:37 PM 02/13/2021  ?  2:50 AM  ?CBC  ?WBC 4.0 - 10.5 K/uL 10.7   14.5   12.7    ?Hemoglobin 12.0 - 15.0 g/dL 12.9   12.4   10.5    ?Hematocrit 36.0 - 46.0 % 40.3   38.9   32.4    ?Platelets 150.0 - 400.0 K/uL 224.0   229.0   197    ? ?Check UDS ?Refilled hydrocodone, additional refills from pain clinic(upcoming appt 05/14/2021) ?Referred to gen. Surgery for lymph node biopsy. ?

## 2021-05-13 NOTE — Progress Notes (Signed)
PROVIDER NOTE: Information contained herein reflects review and annotations entered in association with encounter. Interpretation of such information and data should be left to medically-trained personnel. Information provided to patient can be located elsewhere in the medical record under "Patient Instructions". Document created using STT-dictation technology, any transcriptional errors that may result from process are unintentional.  ?  ?Patient: Autumn Valenzuela  Service Category: E/M  Provider: Gaspar Cola, MD  ?DOB: [birth year]  DOS: 05/14/2021  Specialty: Interventional Pain Management  ?MRN: [id number]  Setting: Ambulatory outpatient  PCP: Flossie Buffy, NP  ?Type: Established Patient    Referring Provider: Flossie Buffy, NP  ?Location: Office  Delivery: Face-to-face    ? ?Primary Reason(s) for Visit: Encounter for evaluation before starting new chronic pain management plan of care (Level of risk: moderate) ?CC: Back Pain (Left lower) ? ?HPI  ?[name] is a 61 y.o. year old, female patient, who comes today for a follow-up evaluation to review the test results and decide on a treatment plan. She has Chronic daily headache; Other generalized ischemic cerebrovascular disease; Diabetic ketoacidosis ([place]); Dermatitis; HTN (hypertension); HLD (hyperlipidemia); DM (diabetes mellitus) ([place]); Radiculopathy; Chronic knee pain (4th area of Pain) (Bilateral) (L>R); Chronic migraine without aura, with intractable migraine, so stated, with status migrainosus; Diabetic neuropathy with neurologic complication ([place]); Claudication of both lower extremities ([place]); RLS (restless legs syndrome); Sleep deprivation; Hot flashes; Neck pain; Type 2 diabetes mellitus with hyperglycemia, with long-term current use of insulin ([place]); Type 2 diabetes mellitus with diabetic polyneuropathy, with long-term current use of insulin ([place]); Excessive daytime sleepiness; Pincer nail deformity; Ulnar 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 of left upper  extremity; Pain in thoracic spine; Chronic low back pain (1ry area of Pain) (Bilateral) (L>R) w/o sciatica; Pseudoarthrosis of lumbar spine; Bipolar disorder in full remission ([place]); Decreased dorsalis pedis pulse; Diabetes mellitus ([place]); Peripheral arterial disease ([place]); PAD (peripheral artery disease) ([place]); Chronic pain syndrome; Pharmacologic therapy; Disorder of skeletal system; Problems influencing health status; Failed back surgical syndrome; History of lumbar fusion; Osteoarthritis of glenohumeral joint (Right); Osteoarthritis of AC (acromioclavicular) joint (Right); Lumbosacral facet arthropathy (Multilevel); History of lumbar laminectomy (L4-S1); Osteoarthritis of knee (Left); Severe obesity (BMI 35.0-39.9) with comorbidity ([place]); Marijuana use; Chronic lower extremity pain (2ry area of Pain) (Bilateral) (L>R); Chronic hip pain (3ry area of Pain) (Bilateral) (L>R); Chronic groin pain (Left); Impaired range of motion of hip; History of diabetic ketoacidosis; History of marijuana use; Abnormal CT scan, lumbar spine (10/11/2020); and Lumbar facet syndrome on their problem list. Her primarily concern today is the Back Pain (Left lower) ? ?Pain Assessment: ?Location: Lower, Left Back ?Radiating: pain radiaties down her left back to her buttock and down the back of her thigh to her  knee,  pain travel in to here groin area with presuure and tightness ?Onset: More than a month ago ?Duration: Chronic pain ?Quality: Tightness, Aching, Constant, Discomfort, Throbbing ?Severity: 7 /10 (subjective, self-reported pain score)  ?Effect on ADL: At times she is unable to walk or stand due to the pain, limits her daily activities ?Timing: Constant ?Modifying factors: warm bath or hot shower, laying down on 6 pillow, meds ?BP: 117/83  HR: 82 ? ?[name] comes in today for a follow-up visit after her initial evaluation on 04/16/2021. Today we went over the results of her tests. These were explained in "[place]'s terms".  During today's appointment we went over my diagnostic impression, as well as the proposed treatment plan. ? ?Precharting Review of initial evaluation (02/28/2021): ?"Review  of electronic MEDICAL RECORD NUMBER?Left lateral L4-5 interbody fusion with instrumentation and allograft by Dr. Lynann Bologna (02/11/2018) (orthopedic surgery) ?Left L4-5 posterior spinal fusion with instrumentation and allograft by Dr. Lynann Bologna (02/12/2018) (orthopedic surgery) ?Posterior lateral fusion L4-5 redo arthrodesis with replacement of bilateral L4 pedicle screws by Dr. Kary Kos (12/01/2020) (noted surgery) ?  ?According to the patient the primary area of pain is that of the lower back (Bilateral) (L>R).  The patient indicates having had 3 prior back surgeries.  She also indicates having had physical therapy multiple times which apparently helped only the first time.  She does indicate having had a recent x-ray and a CT myelogram of the lumbar spine.  She denies ever having had any nerve blocks in the lumbar region. ?  ?The patient's secondary area pain is that of the lower extremities (Bilateral) (L>R).  The patient indicates having had a left total knee replacement but no other surgeries in her leg.  She again indicates having had some physical therapy after that hip replacement which did help.  She refers having had some x-rays done of her hip had at Dr. Windy Carina office, but these seem to have been done for private use since they are not available to [place].  She denies any prior injections in the lower extremities.  The patient also denies any nerve conduction test of the lower extremities.  Either one of the lower extremity pain go below her knee.  She denies any pain, numbness, or weakness below her knees.  This bilateral lower extremity pain runs through the back of the leg into the area of the knee.  She also indicates taking cortisone injections into her feet.  When she first mentioned it, it sounded as if she was getting them on a regular  basis, but as it turns out she refers having an injection every 2 years..  She refers getting these injections for pain in the bottom of her feet and what appears to be an S1 dermatomal distribution bilaterally.  She denies ever having had a nerve conduction test and she normally goes to the foot center one [facility] for those injections. ?  ?The patient's third area pain is that of the hips (Bilateral) (L>R).  She denies any prior hip surgery, recent physical therapy, x-rays, or any nerve blocks.  When talking about her hip, she points that her PSIS area.  She refers having pain that goes into her buttocks (Bilateral) as well as left groin. ?  ?The patient's fourth area pain is that of the knees (Bilateral) (L>R).  She indicates having had a left total knee replacement.  She also indicates having had some physical therapy after that replacement which did help.  She denies any joint injections, nerve blocks, or any recent x-rays. ?  ?Other than the above, the patient indicates having had a left carpal tunnel and elbow release surgery and pending a right carpal tunnel release this Friday.  Other medical problems include a history of having had a stroke in 2007 with residual left-sided weakness. ?  ?Physical exam: The patient was able to toe walk and heel walk without any problems.  Straight leg raise was completely negative bilaterally.  Lumbar flexion was within normal limits and asymptomatic.  Lumbar extension demonstrated decreased range of motion with pain being referred towards the lower portion of the back, bilaterally. Hyperextension and rotation of the lumbar spine and Kemp maneuver were positive bilaterally for lumbar facet arthralgia with the pain being primarily in the area  of the lower back.  Patrick maneuver was positive bilaterally for sacroiliac joint arthralgia and bilateral hip joint arthralgia with the left side being worse than the right. ?  ?The patient did inform the nurse that she does smoke  marijuana frequently, according to her for pain. ?  ?Pharmacotherapy: The patient indicates taking Norco every 6 hours for the pain." ? ?On follow-up, the patient did not attend her scheduled second visit

## 2021-05-14 ENCOUNTER — Ambulatory Visit: Payer: Medicare HMO | Attending: Pain Medicine | Admitting: Pain Medicine

## 2021-05-14 ENCOUNTER — Encounter: Payer: Self-pay | Admitting: Pain Medicine

## 2021-05-14 VITALS — BP 117/83 | HR 82 | Temp 97.1°F | Ht 61.0 in | Wt 207.0 lb

## 2021-05-14 DIAGNOSIS — M25552 Pain in left hip: Secondary | ICD-10-CM | POA: Insufficient documentation

## 2021-05-14 DIAGNOSIS — Z8639 Personal history of other endocrine, nutritional and metabolic disease: Secondary | ICD-10-CM | POA: Insufficient documentation

## 2021-05-14 DIAGNOSIS — R937 Abnormal findings on diagnostic imaging of other parts of musculoskeletal system: Secondary | ICD-10-CM | POA: Insufficient documentation

## 2021-05-14 DIAGNOSIS — E1149 Type 2 diabetes mellitus with other diabetic neurological complication: Secondary | ICD-10-CM | POA: Insufficient documentation

## 2021-05-14 DIAGNOSIS — M25562 Pain in left knee: Secondary | ICD-10-CM | POA: Diagnosis not present

## 2021-05-14 DIAGNOSIS — M79604 Pain in right leg: Secondary | ICD-10-CM | POA: Insufficient documentation

## 2021-05-14 DIAGNOSIS — M961 Postlaminectomy syndrome, not elsewhere classified: Secondary | ICD-10-CM | POA: Insufficient documentation

## 2021-05-14 DIAGNOSIS — M79605 Pain in left leg: Secondary | ICD-10-CM | POA: Diagnosis not present

## 2021-05-14 DIAGNOSIS — F1291 Cannabis use, unspecified, in remission: Secondary | ICD-10-CM | POA: Insufficient documentation

## 2021-05-14 DIAGNOSIS — G8929 Other chronic pain: Secondary | ICD-10-CM | POA: Diagnosis not present

## 2021-05-14 DIAGNOSIS — M545 Low back pain, unspecified: Secondary | ICD-10-CM | POA: Insufficient documentation

## 2021-05-14 DIAGNOSIS — M47816 Spondylosis without myelopathy or radiculopathy, lumbar region: Secondary | ICD-10-CM | POA: Diagnosis not present

## 2021-05-14 DIAGNOSIS — M47817 Spondylosis without myelopathy or radiculopathy, lumbosacral region: Secondary | ICD-10-CM | POA: Insufficient documentation

## 2021-05-14 DIAGNOSIS — G894 Chronic pain syndrome: Secondary | ICD-10-CM | POA: Insufficient documentation

## 2021-05-14 DIAGNOSIS — M25561 Pain in right knee: Secondary | ICD-10-CM | POA: Insufficient documentation

## 2021-05-14 DIAGNOSIS — M25551 Pain in right hip: Secondary | ICD-10-CM | POA: Diagnosis not present

## 2021-05-14 DIAGNOSIS — E1140 Type 2 diabetes mellitus with diabetic neuropathy, unspecified: Secondary | ICD-10-CM | POA: Diagnosis not present

## 2021-05-14 NOTE — Patient Instructions (Addendum)
______________________________________________________________________ ? ?Preparing for Procedure with Sedation ? ?NOTICE: Due to recent regulatory changes, starting on August 14, 2020, procedures requiring intravenous (IV) sedation will no longer be performed at the [facility].  These types of procedures are required to be performed at [facility] ambulatory surgery facility.  We are very sorry for the inconvenience. ? ?Procedure appointments are limited to planned procedures: ?No Prescription Refills. ?No disability issues will be discussed. ?No medication changes will be discussed. ? ?Instructions: ?Oral Intake: Do not eat or drink anything for at least 8 hours prior to your procedure. (Exception: Blood Pressure Medication. See below.) ?Transportation: A driver is required. You may not drive yourself after the procedure. ?Blood Pressure Medicine: Do not forget to take your blood pressure medicine with a sip of water the morning of the procedure. If your Diastolic (lower reading) is above 100 mmHg, elective cases will be cancelled/rescheduled. ?Blood thinners: These will need to be stopped for procedures. Notify our staff if you are taking any blood thinners. Depending on which one you take, there will be specific instructions on how and when to stop it. ?Diabetics on insulin: Notify the staff so that you can be scheduled 1st case in the morning. If your diabetes requires high dose insulin, take only ? of your normal insulin dose the morning of the procedure and notify the staff that you have done so. ?Preventing infections: Shower with an antibacterial soap the morning of your procedure. ?Build-up your immune system: Take 1000 mg of Vitamin C with every meal (3 times a day) the day prior to your procedure. ?Antibiotics: Inform the staff if you have a condition or reason that requires you to take antibiotics before dental procedures. ?Pregnancy: If you are pregnant, call and cancel the procedure. ?Sickness: If  you have a cold, fever, or any active infections, call and cancel the procedure. ?Arrival: You must be in the facility at least 30 minutes prior to your scheduled procedure. ?Children: Do not bring children with you. ?Dress appropriately: Bring dark clothing that you would not mind if they get stained. ?Valuables: Do not bring any jewelry or valuables. ? ?Reasons to call and reschedule or cancel your procedure: (Following these recommendations will minimize the risk of a serious complication.) ?Surgeries: Avoid having procedures within 2 weeks of any surgery. (Avoid for 2 weeks before or after any surgery). ?Flu Shots: Avoid having procedures within 2 weeks of a flu shots. (Avoid for 2 weeks before or after immunizations). ?Barium: Avoid having a procedure within 7-10 days after having had a radiological study involving the use of radiological contrast. (Myelograms, Barium swallow or enema study). ?Heart attacks: Avoid any elective procedures or surgeries for the initial 6 months after a "Myocardial Infarction" (Heart Attack). ?Blood thinners: It is imperative that you stop these medications before procedures. Let us know if you if you take any blood thinner.  ?Infection: Avoid procedures during or within two weeks of an infection (including chest colds or gastrointestinal problems). Symptoms associated with infections include: Localized redness, fever, chills, night sweats or profuse sweating, burning sensation when voiding, cough, congestion, stuffiness, runny nose, sore throat, diarrhea, nausea, vomiting, cold or Flu symptoms, recent or current infections. It is specially important if the infection is over the area that we intend to treat. ?Heart and lung problems: Symptoms that may suggest an active cardiopulmonary problem include: cough, chest pain, breathing difficulties or shortness of breath, dizziness, ankle swelling, uncontrolled high or unusually low blood pressure, and/or palpitations. If you are    experiencing any of these symptoms, cancel your procedure and contact your primary care physician for an evaluation. ? ?Remember:  ?Regular Business hours are:  ?Monday to Thursday 8:00 AM to 4:00 PM ? ?Provider's Schedule: ?Francisco Naveira, MD:  ?Procedure days: Tuesday and Thursday 7:30 AM to 4:00 PM ? ?Bilal Lateef, MD:  ?Procedure days: Monday and Wednesday 7:30 AM to 4:00 PM ?______________________________________________________________________ ? ____________________________________________________________________________________________ ? ?General Risks and Possible Complications ? ?Patient Responsibilities: It is important that you read this as it is part of your informed consent. It is our duty to inform you of the risks and possible complications associated with treatments offered to you. It is your responsibility as a patient to read this and to ask questions about anything that is not clear or that you believe was not covered in this document. ? ?Patient?s Rights: You have the right to refuse treatment. You also have the right to change your mind, even after initially having agreed to have the treatment done. However, under this last option, if you wait until the last second to change your mind, you may be charged for the materials used up to that point. ? ?Introduction: Medicine is not an exact science. Everything in Medicine, including the lack of treatment(s), carries the potential for danger, harm, or loss (which is by definition: Risk). In Medicine, a complication is a secondary problem, condition, or disease that can aggravate an already existing one. All treatments carry the risk of possible complications. The fact that a side effects or complications occurs, does not imply that the treatment was conducted incorrectly. It must be clearly understood that these can happen even when everything is done following the highest safety standards. ? ?No treatment: You can choose not to proceed with the  proposed treatment alternative. The ?PRO(s)? would include: avoiding the risk of complications associated with the therapy. The ?CON(s)? would include: not getting any of the treatment benefits. These benefits fall under one of three categories: diagnostic; therapeutic; and/or palliative. Diagnostic benefits include: getting information which can ultimately lead to improvement of the disease or symptom(s). Therapeutic benefits are those associated with the successful treatment of the disease. Finally, palliative benefits are those related to the decrease of the primary symptoms, without necessarily curing the condition (example: decreasing the pain from a flare-up of a chronic condition, such as incurable terminal cancer). ? ?General Risks and Complications: These are associated to most interventional treatments. They can occur alone, or in combination. They fall under one of the following six (6) categories: no benefit or worsening of symptoms; bleeding; infection; nerve damage; allergic reactions; and/or death. ?No benefits or worsening of symptoms: In Medicine there are no guarantees, only probabilities. No healthcare provider can ever guarantee that a medical treatment will work, they can only state the probability that it may. Furthermore, there is always the possibility that the condition may worsen, either directly, or indirectly, as a consequence of the treatment. ?Bleeding: This is more common if the patient is taking a blood thinner, either prescription or over the counter (example: Goody Powders, Fish oil, Aspirin, Garlic, etc.), or if suffering a condition associated with impaired coagulation (example: Hemophilia, cirrhosis of the liver, low platelet counts, etc.). However, even if you do not have one on these, it can still happen. If you have any of these conditions, or take one of these drugs, make sure to notify your treating physician. ?Infection: This is more common in patients with a compromised  immune system, either due to disease (example:   diabetes, cancer, human immunodeficiency virus [HIV], etc.), or due to medications or treatments (example: therapies used to treat cancer and rheumatological diseas

## 2021-05-14 NOTE — Progress Notes (Signed)
Safety precautions to be maintained throughout the outpatient stay will include: orient to surroundings, keep bed in low position, maintain call bell within reach at all times, provide assistance with transfer out of bed and ambulation.  

## 2021-05-17 NOTE — Progress Notes (Signed)
? ?Name: SERINE KEA  ?Age/ Sex: 61 y.o., female   ?MRN/ DOB: [id number], Dec 29, 1960    ? ?PCP: Flossie Buffy, NP   ?Reason for Endocrinology Evaluation: Type 2 Diabetes Mellitus  ?Initial Endocrine Consultative Visit: 03/08/2019  ? ? ?PATIENT IDENTIFIER: Ms. ANJANAE WOEHRLE is a 61 y.o. female with a past medical history of .T2Dm, bipolar disorder,migrane headaches  and dyslipidemia  The patient has followed with Endocrinology clinic since 03/08/2019 for consultative assistance with management of her diabetes. ? ?DIABETIC HISTORY:  ?Ms. Weich was diagnosed with T2DM in 2018.  She has been tried on oral glycemic agents such as glipizide and Metformin, she is intolerant to higher doses of Metformin.  She has been on insulin since 2018.  Her hemoglobin A1c has ranged from  6.7% in 2018, peaking at 14.6% in 2021 ? ? ?On her initial visit to our clinic she had an A1c of 14.6%, she was on metformin and Lantus. We continued that and started Jardiance. By 09/2020 we provided her with novolog per correction scale  ? ? ?SUBJECTIVE:  ? ?During the last visit (01/19/2021): A1c of 7.9 %.  We continued metformin and Lantus and increased Jardiance  ? ? ? ?Today (05/18/2021): Ms. Egle is here for follow-up on diabetes management. .She has not checked  glucose in a while , she contributed this to faulty strips. ? ?She has not been getting dexcom either, unknown reason ?Has not been taking Novolog because she has not been checking her glucose ?Denies nausea, vomiting or diarrhea  ? ?She is status post left common femoral endarterectomy on 02/12/2021 ? ? ? ? ?HOME DIABETES REGIMEN:  ?Metformin 500 mg BID  ?Lantus 26 units daily ?Jardiance 25 mg daily  ?Correction scale : Novolog (BG- 130/20)  ? ? ? ?Statin: Yes ?ACE-I/ARB: No ? ? ?METER DOWNLOAD SUMMARY: Did not bring ? ?DIABETIC COMPLICATIONS: ?Microvascular complications:  ?Neuropathy ?Denies: CKD, retinopathy  ?Last eye exam: Completed 02/2021 ?  ?Macrovascular complications:   ?Denies: CAD, PVD, CVA ?  ? ? ?HISTORY:  ?Past Medical History:  ?Past Medical History:  ?Diagnosis Date  ? Allergy   ? Anemia   ? Anxiety   ? Arthritis   ? Asthma   ? Back pain   ? Bipolar disorder ([place])   ? CVA (cerebral infarction)   ? Depression   ? Diabetes mellitus without complication ([place])   ? High cholesterol   ? Hypertension   ? Left leg pain   ? Migraine   ? Sleep apnea   ? does not use a cpap  ? Stroke [facility]) 2007  ? no lasting weakness  ? ?Past Surgical History:  ?Past Surgical History:  ?Procedure Laterality Date  ? ABDOMINAL AORTOGRAM W/LOWER EXTREMITY N/A 02/08/2021  ? Procedure: ABDOMINAL AORTOGRAM W/LOWER EXTREMITY;  Surgeon: Marty Heck, MD;  Location: [facility];  Service: Cardiovascular;  Laterality: N/A;  ? ABDOMINAL HYSTERECTOMY    ? ANTERIOR LATERAL LUMBAR FUSION WITH PERCUTANEOUS SCREW 1 LEVEL Left 02/11/2018  ? Procedure: LEFT LATERAL LUMBAR  FOUR-FIVE INTERBODY FUSION WITH INSTRUMENTATION AND ALLOGRAFT;  Surgeon: Phylliss Bob, MD;  Location: [place];  Service: Orthopedics;  Laterality: Left;  LEFT LATERAL LUMBAR  FOUR-FIVE INTERBODY FUSION WITH INSTRUMENTATION AND ALLOGRAFT  ? BACK SURGERY    ? Lumbar fusion L5-S1  ? DIABETES    ? ENDARTERECTOMY FEMORAL Left 02/12/2021  ? Procedure: LEFT COMMON FEMORAL ENDARTERECTOMY WITH BOVINE PATCH;  Surgeon: Marty Heck, MD;  Location: [facility]  OR;  Service: Vascular;  Laterality: Left;  ? EYE SURGERY Bilateral   ? KNEE SURGERY    ? l 4-l5 status post lateral posterior fusion January 30,2020    ? NEUROFORAMINAL STENOSIS Left   ? Severe L4-L5, involving the level above previous fusion.  ? PARTIAL HYSTERECTOMY    ? RADICULOPATHY Left   ? L4, Secondary to an L4-L5 Spondylolisthesis  ? SHOULDER SURGERY    ? ?Social History:  reports that she quit smoking about 5 years ago. Her smoking use included cigarettes. She has never used smokeless tobacco. She reports current alcohol use. She reports current drug use. Drug: Marijuana. ?Family  History:  ?Family History  ?Problem Relation Age of Onset  ? Diabetes Mother   ? Stroke Father   ? Cancer Paternal Aunt   ? Cancer Paternal Aunt   ? Diabetes Brother   ? Colon cancer Neg Hx   ? Esophageal cancer Neg Hx   ? Rectal cancer Neg Hx   ? Stomach cancer Neg Hx   ? ? ? ?HOME MEDICATIONS: ?Allergies as of 05/18/2021   ?No Known Allergies ?  ? ?  ?Medication List  ?  ? ?  ? Accurate as of May 18, 2021 12:27 PM. If you have any questions, ask your nurse or doctor.  ?  ?  ? ?  ? ?Accu-Chek FastClix Lancets Misc ?TEST 4 TIMES DAILY. DX E11.9 ?  ?Accu-Chek Guide Control Liqd ?1 each by In Vitro route every 30 (thirty) days. Needs Accu-check guide monitor system. DX. E11.9 ?What changed: additional instructions ?  ?albuterol 108 (90 Base) MCG/ACT inhaler ?Commonly known as: VENTOLIN HFA ?Inhale 2 puffs into the lungs daily as needed for wheezing or shortness of breath. ?  ?albuterol (2.5 MG/3ML) 0.083% nebulizer solution ?Commonly known as: PROVENTIL ?Take 2.5 mg by nebulization every 6 (six) hours as needed for wheezing. ?  ?aspirin 81 MG EC tablet ?Take 1 tablet (81 mg total) by mouth daily at 6 (six) AM. Swallow whole. ?  ?Basaglar KwikPen 100 UNIT/ML ?Inject 26 Units into the skin at bedtime. ?  ?blood glucose meter kit and supplies ?Dispense based on patient and insurance preference. Use up to four times daily as directed. (FOR ICD-10 E10.9, E11.9). ?  ?buPROPion 75 MG tablet ?Commonly known as: WELLBUTRIN ?Take 1 tablet (75 mg total) by mouth 2 (two) times daily. ?  ?Dexcom G6 Sensor Misc ?1 Device by Does not apply route as directed. ?Started by: Dorita Sciara, MD ?  ?Dexcom G6 Transmitter Misc ?1 Device by Does not apply route as directed. ?Started by: Dorita Sciara, MD ?  ?diclofenac Sodium 1 % Gel ?Commonly known as: Voltaren ?Apply 2 g topically 4 (four) times daily. ?What changed:  ?when to take this ?reasons to take this ?  ?DULoxetine 60 MG capsule ?Commonly known as: CYMBALTA ?Take 1  capsule (60 mg total) by mouth at bedtime. ?  ?ferrous sulfate 325 (65 FE) MG tablet ?Take 1 tablet (325 mg total) by mouth every other day. ?What changed: when to take this ?  ?fluticasone 50 MCG/ACT nasal spray ?Commonly known as: FLONASE ?Place 2 sprays into both nostrils daily as needed for allergies. ?  ?gabapentin 300 MG capsule ?Commonly known as: NEURONTIN ?Take 1 capsule (300 mg total) by mouth 3 (three) times daily. ?What changed: when to take this ?  ?HYDROcodone-acetaminophen 5-325 MG tablet ?Commonly known as: NORCO/VICODIN ?Take 1 tablet by mouth every 12 (twelve) hours as needed for moderate  pain. ?  ?Insulin Pen Needle 31G X 5 MM Misc ?1 Units by Does not apply route in the morning, at noon, in the evening, and at bedtime. ?  ?JARDIANCE PO ?Take by mouth. ?  ?metFORMIN 500 MG 24 hr tablet ?Commonly known as: GLUCOPHAGE-XR ?Take 1 tablet (500 mg total) by mouth in the morning and at bedtime. ?  ?NovoLOG FlexPen 100 UNIT/ML FlexPen ?Generic drug: insulin aspart ?Max daily 30 units ?What changed:  ?how much to take ?how to take this ?when to take this ?reasons to take this ?  ?OneTouch Verio test strip ?Generic drug: glucose blood ?CHECK UP TO 4 TIMES A DAY AS DIRECTED ?  ?Ozempic (0.25 or 0.5 MG/DOSE) 2 MG/3ML Sopn ?Generic drug: Semaglutide(0.25 or 0.5MG/DOS) ?Inject 0.5 mg into the skin once a week. ?Started by: Dorita Sciara, MD ?  ?rosuvastatin 20 MG tablet ?Commonly known as: CRESTOR ?TAKE 1 TABLET(20 MG) BY MOUTH DAILY ?  ?Vitamin D3 1.25 MG (50000 UT) Caps ?Take 1 capsule by mouth once a week. ?  ? ?  ? ? ? ?OBJECTIVE:  ? ?Vital Signs: BP 122/70 (BP Location: Left Arm, Patient Position: Sitting, Cuff Size: Large)   Pulse 87   Ht '5\' 1"'  (1.549 m)   Wt 207 lb (93.9 kg)   SpO2 99%   BMI 39.11 kg/m?   ?Wt Readings from Last 3 Encounters:  ?05/18/21 207 lb (93.9 kg)  ?05/14/21 207 lb (93.9 kg)  ?05/02/21 207 lb 6.4 oz (94.1 kg)  ? ? ? ?Exam: ?General: Pt appears well and is in NAD  ?Lungs:  Clear with good BS bilat with no rales, rhonchi, or wheezes  ?Heart: RRR  ?Extremities: No pretibial edema.   ?Neuro: MS is good with appropriate affect, pt is alert and Ox3  ? ?DM Foot Exam 01/19/2021 ? ? ? ?The skin

## 2021-05-18 ENCOUNTER — Ambulatory Visit (INDEPENDENT_AMBULATORY_CARE_PROVIDER_SITE_OTHER): Payer: Medicare HMO | Admitting: Internal Medicine

## 2021-05-18 ENCOUNTER — Encounter: Payer: Self-pay | Admitting: Internal Medicine

## 2021-05-18 VITALS — BP 122/70 | HR 87 | Ht 61.0 in | Wt 207.0 lb

## 2021-05-18 DIAGNOSIS — Z794 Long term (current) use of insulin: Secondary | ICD-10-CM | POA: Diagnosis not present

## 2021-05-18 DIAGNOSIS — E1165 Type 2 diabetes mellitus with hyperglycemia: Secondary | ICD-10-CM

## 2021-05-18 DIAGNOSIS — R739 Hyperglycemia, unspecified: Secondary | ICD-10-CM

## 2021-05-18 DIAGNOSIS — E1142 Type 2 diabetes mellitus with diabetic polyneuropathy: Secondary | ICD-10-CM | POA: Diagnosis not present

## 2021-05-18 DIAGNOSIS — E1159 Type 2 diabetes mellitus with other circulatory complications: Secondary | ICD-10-CM

## 2021-05-18 LAB — POCT GLUCOSE (DEVICE FOR HOME USE): Glucose Fasting, POC: 228 mg/dL — AB (ref 70–99)

## 2021-05-18 LAB — POCT GLYCOSYLATED HEMOGLOBIN (HGB A1C): Hemoglobin A1C: 9.2 % — AB (ref 4.0–5.6)

## 2021-05-18 MED ORDER — EMPAGLIFLOZIN 25 MG PO TABS: 25.0000 mg | ORAL_TABLET | Freq: Every day | ORAL | 3 refills | Status: DC

## 2021-05-18 MED ORDER — DEXCOM G6 SENSOR MISC: 1.0000 | 3 refills | Status: DC

## 2021-05-18 MED ORDER — OZEMPIC (0.25 OR 0.5 MG/DOSE) 2 MG/3ML ~~LOC~~ SOPN: 0.5000 mg | PEN_INJECTOR | SUBCUTANEOUS | 3 refills | Status: DC

## 2021-05-18 MED ORDER — DEXCOM G6 TRANSMITTER MISC
1.0000 | 3 refills | Status: DC
Start: 2021-05-18 — End: 2022-03-25

## 2021-05-18 NOTE — Patient Instructions (Signed)
-   Start Ozempic 0.25 mg weekly for 6 weeks, than increase to 0.5 mg weekly after that ?- Continue Metformin 500 mg 1 tablet Twice a day  ?- Continue  lantus 26 units daily  ?- Continue Jardiance to 25 mg , 1 tablet daily ? ?Novolog correctional insulin: Use the scale below to help guide you:  ? ?Blood sugar before meal Number of units to inject  ?Less than 150 0 unit  ?151 -  170 1 units  ?171 -  190 2 units  ?191 -  210 3 units  ?211 -  230 4 units  ?231 -  250 5 units  ?251 -  270 6 units  ?271 -  290 7 units  ?291 -  310 8 units  ?311 - 330 9 units   ?331 -  350 10 units   ? ? ? ? ? ? ?HOW TO TREAT LOW BLOOD SUGARS (Blood sugar LESS THAN 70 MG/DL) ?Please follow the RULE OF 15 for the treatment of hypoglycemia treatment (when your (blood sugars are less than 70 mg/dL)  ? ?STEP 1: Take 15 grams of carbohydrates when your blood sugar is low, which includes:  ?3-4 GLUCOSE TABS  OR ?3-4 OZ OF JUICE OR REGULAR SODA OR ?ONE TUBE OF GLUCOSE GEL   ? ?STEP 2: RECHECK blood sugar in 15 MINUTES ?STEP 3: If your blood sugar is still low at the 15 minute recheck --> then, go back to STEP 1 and treat AGAIN with another 15 grams of carbohydrates. ? ?

## 2021-05-21 DIAGNOSIS — E1165 Type 2 diabetes mellitus with hyperglycemia: Secondary | ICD-10-CM | POA: Diagnosis not present

## 2021-05-21 DIAGNOSIS — E1142 Type 2 diabetes mellitus with diabetic polyneuropathy: Secondary | ICD-10-CM | POA: Diagnosis not present

## 2021-05-30 ENCOUNTER — Encounter: Payer: Self-pay | Admitting: Nurse Practitioner

## 2021-05-30 ENCOUNTER — Ambulatory Visit (INDEPENDENT_AMBULATORY_CARE_PROVIDER_SITE_OTHER): Payer: Medicare HMO | Admitting: Nurse Practitioner

## 2021-05-30 DIAGNOSIS — F3175 Bipolar disorder, in partial remission, most recent episode depressed: Secondary | ICD-10-CM | POA: Diagnosis not present

## 2021-05-30 DIAGNOSIS — R69 Illness, unspecified: Secondary | ICD-10-CM | POA: Diagnosis not present

## 2021-05-30 MED ORDER — BUPROPION HCL 75 MG PO TABS: 75.0000 mg | ORAL_TABLET | Freq: Two times a day (BID) | ORAL | 3 refills | Status: DC

## 2021-05-30 MED ORDER — DULOXETINE HCL 60 MG PO CPEP: 60.0000 mg | ORAL_CAPSULE | Freq: Two times a day (BID) | ORAL | 0 refills | Status: DC

## 2021-05-30 NOTE — Patient Instructions (Addendum)
Call [facility] to schedule appt for lymph node biopsy: [phone] ?Schedule appt with pain clinic. ?Schedule appt for annual diabetic eye exam ([facility]). ? ?Increase cymbalta to '60mg'$  BID ?Maintain wellbutrin dose ? ? ? ?

## 2021-05-30 NOTE — Assessment & Plan Note (Addendum)
Reports intermittent anxiety. ?Current use of Wellbutrin and cymbalta ?Denies need for psychology referral. ?No SI/HI ?No hallucination. ? ?Increase cymbalta to '60mg'$  BID ?Maintain wellbutrin dose ?Refrain from alprazolam rx due to chronic pain management with an opioid. Advised her about the risk of drug dependence and overdose. ?F/up in 26month?

## 2021-05-30 NOTE — Progress Notes (Signed)
? ?             Established Patient Visit ? ?Patient: Autumn Valenzuela   DOB: 1960-06-28   61 y.o. Female  MRN: [id number] ?Visit Date: 05/30/2021 ? ?Subjective:  ?  ?Chief Complaint  ?Patient presents with  ? office visit  ?  F/u for depression & DM ?Meds have been working well for her depression, has been getting out of the house and doing things to help with her anxiety but would like a Rx for xanax to help with anxiety if possible. ?Blood sugars have been running normal around 92, the highest has been 136.  ? ?HPI ?Bipolar disorder ([place]) ?Reports intermittent anxiety. ?Current use of Wellbutrin and cymbalta ?Denies need for psychology referral. ?No SI/HI ?No hallucination. ? ?Increase cymbalta to 100m BID ?Maintain wellbutrin dose ?Refrain from alprazolam rx due to chronic pain management with an opioid. Advised her about the risk of drug dependence and overdose. ?F/up in 165month ?Reviewed medical, surgical, and social history today ? ?Medications: ?Outpatient Medications Prior to Visit  ?Medication Sig  ? Accu-Chek FastClix Lancets MISC TEST 4 TIMES DAILY. DX E11.9  ? albuterol (PROVENTIL) (2.5 MG/3ML) 0.083% nebulizer solution Take 2.5 mg by nebulization every 6 (six) hours as needed for wheezing.  ? albuterol (VENTOLIN HFA) 108 (90 Base) MCG/ACT inhaler Inhale 2 puffs into the lungs daily as needed for wheezing or shortness of breath.  ? aspirin EC 81 MG EC tablet Take 1 tablet (81 mg total) by mouth daily at 6 (six) AM. Swallow whole.  ? Blood Glucose Calibration (ACCU-CHEK GUIDE CONTROL) LIQD 1 each by In Vitro route every 30 (thirty) days. Needs Accu-check guide monitor system. DX. E11.9 (Patient taking differently: 1 each by In Vitro route every 30 (thirty) days. Needs Accu-check guide monitor system. DX. E11.9 2 times daily)  ? blood glucose meter kit and supplies Dispense based on patient and insurance preference. Use up to four times daily as directed. (FOR ICD-10 E10.9, E11.9).  ? Cholecalciferol  (VITAMIN D3) 1.25 MG (50000 UT) CAPS Take 1 capsule by mouth once a week.  ? Continuous Blood Gluc Sensor (DEXCOM G6 SENSOR) MISC 1 Device by Does not apply route as directed.  ? Continuous Blood Gluc Transmit (DEXCOM G6 TRANSMITTER) MISC 1 Device by Does not apply route as directed.  ? diclofenac Sodium (VOLTAREN) 1 % GEL Apply 2 g topically 4 (four) times daily. (Patient taking differently: Apply 2 g topically 4 (four) times daily as needed (pain).)  ? empagliflozin (JARDIANCE) 25 MG TABS tablet Take 1 tablet (25 mg total) by mouth daily.  ? ferrous sulfate 325 (65 FE) MG tablet Take 1 tablet (325 mg total) by mouth every other day. (Patient taking differently: Take 325 mg by mouth daily.)  ? fluticasone (FLONASE) 50 MCG/ACT nasal spray Place 2 sprays into both nostrils daily as needed for allergies.  ? gabapentin (NEURONTIN) 300 MG capsule Take 1 capsule (300 mg total) by mouth 3 (three) times daily. (Patient taking differently: Take 300 mg by mouth 2 (two) times daily.)  ? HYDROcodone-acetaminophen (NORCO/VICODIN) 5-325 MG tablet Take 1 tablet by mouth every 12 (twelve) hours as needed for moderate pain.  ? insulin aspart (NOVOLOG FLEXPEN) 100 UNIT/ML FlexPen Max daily 30 units (Patient taking differently: Inject 1-4 Units into the skin 3 (three) times daily as needed (blood sugar over 150). Max daily 30 units)  ? Insulin Glargine (BASAGLAR KWIKPEN) 100 UNIT/ML Inject 26 Units into the skin at bedtime.  ?  Insulin Pen Needle 31G X 5 MM MISC 1 Units by Does not apply route in the morning, at noon, in the evening, and at bedtime.  ? metFORMIN (GLUCOPHAGE-XR) 500 MG 24 hr tablet Take 1 tablet (500 mg total) by mouth in the morning and at bedtime.  ? ONETOUCH VERIO test strip CHECK UP TO 4 TIMES A DAY AS DIRECTED  ? rosuvastatin (CRESTOR) 20 MG tablet TAKE 1 TABLET(20 MG) BY MOUTH DAILY  ? Semaglutide,0.25 or 0.5MG/DOS, (OZEMPIC, 0.25 OR 0.5 MG/DOSE,) 2 MG/3ML SOPN Inject 0.5 mg into the skin once a week.  ?  [DISCONTINUED] buPROPion (WELLBUTRIN) 75 MG tablet Take 1 tablet (75 mg total) by mouth 2 (two) times daily.  ? [DISCONTINUED] DULoxetine (CYMBALTA) 60 MG capsule Take 1 capsule (60 mg total) by mouth at bedtime.  ? ?No facility-administered medications prior to visit.  ? ?Reviewed past medical and social history.  ? ?ROS per HPI above ? ? ?   ?Objective:  ?BP 130/82 (BP Location: Left Arm, Patient Position: Sitting, Cuff Size: Normal)   Pulse 70   Temp (!) 97 ?F (36.1 ?C) (Temporal)   Ht '5\' 1"'  (1.549 m)   Wt 202 lb 6.4 oz (91.8 kg)   SpO2 98%   BMI 38.24 kg/m?  ? ?  ? ?Physical Exam ?Cardiovascular:  ?   Rate and Rhythm: Normal rate and regular rhythm.  ?   Pulses: Normal pulses.  ?   Heart sounds: Normal heart sounds.  ?Pulmonary:  ?   Effort: Pulmonary effort is normal.  ?   Breath sounds: Normal breath sounds.  ?Musculoskeletal:  ?   Right lower leg: No edema.  ?   Left lower leg: No edema.  ?Neurological:  ?   Mental Status: She is alert and oriented to person, place, and time.  ?Psychiatric:     ?   Mood and Affect: Mood normal.     ?   Behavior: Behavior normal.     ?   Thought Content: Thought content normal.  ?  ?No results found for any visits on 05/30/21. ?   ?Assessment & Plan:  ?  ?Problem List Items Addressed This Visit   ? ?  ? Other  ? Bipolar disorder ([place])  ?  Reports intermittent anxiety. ?Current use of Wellbutrin and cymbalta ?Denies need for psychology referral. ?No SI/HI ?No hallucination. ? ?Increase cymbalta to 54m BID ?Maintain wellbutrin dose ?Refrain from alprazolam rx due to chronic pain management with an opioid. Advised her about the risk of drug dependence and overdose. ?F/up in 165month ?  ?  ? Relevant Medications  ? DULoxetine (CYMBALTA) 60 MG capsule  ? buPROPion (WELLBUTRIN) 75 MG tablet  ? ?Return in about 4 weeks (around 06/27/2021) for depression and anxiety (complete PHQ/GAD). ? ?  ? ?ChWilfred LacyNP ? ? ?

## 2021-05-31 ENCOUNTER — Other Ambulatory Visit: Payer: Self-pay | Admitting: Nurse Practitioner

## 2021-05-31 DIAGNOSIS — F3175 Bipolar disorder, in partial remission, most recent episode depressed: Secondary | ICD-10-CM

## 2021-06-20 DIAGNOSIS — E1142 Type 2 diabetes mellitus with diabetic polyneuropathy: Secondary | ICD-10-CM | POA: Diagnosis not present

## 2021-06-20 DIAGNOSIS — E1165 Type 2 diabetes mellitus with hyperglycemia: Secondary | ICD-10-CM | POA: Diagnosis not present

## 2021-06-26 ENCOUNTER — Ambulatory Visit
Admission: RE | Admit: 2021-06-26 | Discharge: 2021-06-26 | Disposition: A | Discharge status: Home / Self Care | Payer: Medicare HMO | Source: Ambulatory Visit | Attending: Pain Medicine | Admitting: Pain Medicine

## 2021-06-26 ENCOUNTER — Other Ambulatory Visit: Payer: Self-pay | Admitting: Nurse Practitioner

## 2021-06-26 ENCOUNTER — Other Ambulatory Visit: Payer: Self-pay

## 2021-06-26 ENCOUNTER — Ambulatory Visit: Payer: Medicare HMO | Attending: Pain Medicine | Admitting: Pain Medicine

## 2021-06-26 ENCOUNTER — Encounter: Payer: Self-pay | Admitting: Pain Medicine

## 2021-06-26 VITALS — BP 107/72 | HR 85 | Temp 97.2°F | Resp 16 | Ht 61.0 in | Wt 198.7 lb

## 2021-06-26 DIAGNOSIS — Z794 Long term (current) use of insulin: Secondary | ICD-10-CM | POA: Diagnosis not present

## 2021-06-26 DIAGNOSIS — Z79891 Long term (current) use of opiate analgesic: Secondary | ICD-10-CM | POA: Diagnosis not present

## 2021-06-26 DIAGNOSIS — M545 Low back pain, unspecified: Secondary | ICD-10-CM | POA: Diagnosis present

## 2021-06-26 DIAGNOSIS — M47896 Other spondylosis, lumbar region: Secondary | ICD-10-CM | POA: Insufficient documentation

## 2021-06-26 DIAGNOSIS — Z1231 Encounter for screening mammogram for malignant neoplasm of breast: Secondary | ICD-10-CM

## 2021-06-26 DIAGNOSIS — M5137 Other intervertebral disc degeneration, lumbosacral region: Secondary | ICD-10-CM | POA: Diagnosis not present

## 2021-06-26 DIAGNOSIS — M5410 Radiculopathy, site unspecified: Secondary | ICD-10-CM | POA: Diagnosis not present

## 2021-06-26 DIAGNOSIS — Z90711 Acquired absence of uterus with remaining cervical stump: Secondary | ICD-10-CM | POA: Diagnosis not present

## 2021-06-26 DIAGNOSIS — Z981 Arthrodesis status: Secondary | ICD-10-CM | POA: Diagnosis not present

## 2021-06-26 DIAGNOSIS — Z79899 Other long term (current) drug therapy: Secondary | ICD-10-CM | POA: Insufficient documentation

## 2021-06-26 DIAGNOSIS — M47816 Spondylosis without myelopathy or radiculopathy, lumbar region: Secondary | ICD-10-CM | POA: Diagnosis not present

## 2021-06-26 DIAGNOSIS — G8929 Other chronic pain: Secondary | ICD-10-CM | POA: Insufficient documentation

## 2021-06-26 DIAGNOSIS — E119 Type 2 diabetes mellitus without complications: Secondary | ICD-10-CM | POA: Insufficient documentation

## 2021-06-26 DIAGNOSIS — M47817 Spondylosis without myelopathy or radiculopathy, lumbosacral region: Secondary | ICD-10-CM | POA: Diagnosis present

## 2021-06-26 DIAGNOSIS — Z9889 Other specified postprocedural states: Secondary | ICD-10-CM | POA: Insufficient documentation

## 2021-06-26 DIAGNOSIS — Z7985 Long-term (current) use of injectable non-insulin antidiabetic drugs: Secondary | ICD-10-CM | POA: Insufficient documentation

## 2021-06-26 DIAGNOSIS — Z6837 Body mass index (BMI) 37.0-37.9, adult: Secondary | ICD-10-CM | POA: Insufficient documentation

## 2021-06-26 DIAGNOSIS — Z8639 Personal history of other endocrine, nutritional and metabolic disease: Secondary | ICD-10-CM | POA: Insufficient documentation

## 2021-06-26 DIAGNOSIS — Z7982 Long term (current) use of aspirin: Secondary | ICD-10-CM | POA: Insufficient documentation

## 2021-06-26 DIAGNOSIS — M4800 Spinal stenosis, site unspecified: Secondary | ICD-10-CM | POA: Diagnosis not present

## 2021-06-26 DIAGNOSIS — Z7951 Long term (current) use of inhaled steroids: Secondary | ICD-10-CM | POA: Insufficient documentation

## 2021-06-26 DIAGNOSIS — Z7984 Long term (current) use of oral hypoglycemic drugs: Secondary | ICD-10-CM | POA: Diagnosis not present

## 2021-06-26 MED ORDER — FENTANYL CITRATE (PF) 100 MCG/2ML IJ SOLN
INTRAMUSCULAR | Status: AC
  Filled 2021-06-26: qty 2

## 2021-06-26 MED ORDER — MIDAZOLAM HCL 5 MG/5ML IJ SOLN
INTRAMUSCULAR | Status: AC
  Filled 2021-06-26: qty 5

## 2021-06-26 MED ORDER — LIDOCAINE HCL 2 % IJ SOLN
INTRAMUSCULAR | Status: AC
  Filled 2021-06-26: qty 20

## 2021-06-26 MED ORDER — TRIAMCINOLONE ACETONIDE 40 MG/ML IJ SUSP: 80.0000 mg | Freq: Once | INTRAMUSCULAR | Status: DC

## 2021-06-26 MED ORDER — MIDAZOLAM HCL 5 MG/5ML IJ SOLN
0.5000 mg | Freq: Once | INTRAMUSCULAR | Status: AC
  Administered 2021-06-26: 3 mg via INTRAVENOUS

## 2021-06-26 MED ORDER — ROPIVACAINE HCL 2 MG/ML IJ SOLN
18.0000 mL | Freq: Once | INTRAMUSCULAR | Status: AC
  Administered 2021-06-26: 18 mL via PERINEURAL

## 2021-06-26 MED ORDER — LACTATED RINGERS IV SOLN: Freq: Once | INTRAVENOUS | Status: AC

## 2021-06-26 MED ORDER — ROPIVACAINE HCL 2 MG/ML IJ SOLN
INTRAMUSCULAR | Status: AC
  Filled 2021-06-26: qty 20

## 2021-06-26 MED ORDER — FENTANYL CITRATE (PF) 100 MCG/2ML IJ SOLN
25.0000 ug | INTRAMUSCULAR | Status: DC | PRN
  Administered 2021-06-26: 50 ug via INTRAVENOUS

## 2021-06-26 MED ORDER — LIDOCAINE HCL 2 % IJ SOLN
20.0000 mL | Freq: Once | INTRAMUSCULAR | Status: AC
  Administered 2021-06-26: 400 mg

## 2021-06-26 NOTE — Patient Instructions (Addendum)
____________________________________________________________________________________________  Post-Procedure Discharge Instructions  Instructions: Apply ice:  Purpose: This will minimize any swelling and discomfort after procedure.  When: Day of procedure, as soon as you get home. How: Fill a plastic sandwich bag with crushed ice. Cover it with a small towel and apply to injection site. How long: (15 min on, 15 min off) Apply for 15 minutes then remove x 15 minutes.  Repeat sequence on day of procedure, until you go to bed. Apply heat:  Purpose: To treat any soreness and discomfort from the procedure. When: Starting the next day after the procedure. How: Apply heat to procedure site starting the day following the procedure. How long: May continue to repeat daily, until discomfort goes away. Food intake: Start with clear liquids (like water) and advance to regular food, as tolerated.  Physical activities: Keep activities to a minimum for the first 8 hours after the procedure. After that, then as tolerated. Driving: If you have received any sedation, be responsible and do not drive. You are not allowed to drive for 24 hours after having sedation. Blood thinner: (Applies only to those taking blood thinners) You may restart your blood thinner 6 hours after your procedure. Insulin: (Applies only to Diabetic patients taking insulin) As soon as you can eat, you may resume your normal dosing schedule. Infection prevention: Keep procedure site clean and dry. Shower daily and clean area with soap and water. Post-procedure Pain Diary: Extremely important that this be done correctly and accurately. Recorded information will be used to determine the next step in treatment. For the purpose of accuracy, follow these rules: Evaluate only the area treated. Do not report or include pain from an untreated area. For the purpose of this evaluation, ignore all other areas of pain, except for the treated  area. After your procedure, avoid taking a long nap and attempting to complete the pain diary after you wake up. Instead, set your alarm clock to go off every hour, on the hour, for the initial 8 hours after the procedure. Document the duration of the numbing medicine, and the relief you are getting from it. Do not go to sleep and attempt to complete it later. It will not be accurate. If you received sedation, it is likely that you were given a medication that may cause amnesia. Because of this, completing the diary at a later time may cause the information to be inaccurate. This information is needed to plan your care. Follow-up appointment: Keep your post-procedure follow-up evaluation appointment after the procedure (usually 2 weeks for most procedures, 6 weeks for radiofrequencies). DO NOT FORGET to bring you pain diary with you.   Expect: (What should I expect to see with my procedure?) From numbing medicine (AKA: Local Anesthetics): Numbness or decrease in pain. You may also experience some weakness, which if present, could last for the duration of the local anesthetic. Onset: Full effect within 15 minutes of injected. Duration: It will depend on the type of local anesthetic used. On the average, 1 to 8 hours.  From steroids (Applies only if steroids were used): Decrease in swelling or inflammation. Once inflammation is improved, relief of the pain will follow. Onset of benefits: Depends on the amount of swelling present. The more swelling, the longer it will take for the benefits to be seen. In some cases, up to 10 days. Duration: Steroids will stay in the system x 2 weeks. Duration of benefits will depend on multiple posibilities including persistent irritating factors. Side-effects: If present, they   may typically last 2 weeks (the duration of the steroids). Frequent: Cramps (if they occur, drink Gatorade and take over-the-counter Magnesium 450-500 mg once to twice a day); water retention with  temporary weight gain; increases in blood sugar; decreased immune system response; increased appetite. Occasional: Facial flushing (red, warm cheeks); mood swings; menstrual changes. Uncommon: Long-term decrease or suppression of natural hormones; bone thinning. (These are more common with higher doses or more frequent use. This is why we prefer that our patients avoid having any injection therapies in other practices.)  Very Rare: Severe mood changes; psychosis; aseptic necrosis. From procedure: Some discomfort is to be expected once the numbing medicine wears off. This should be minimal if ice and heat are applied as instructed.  Call if: (When should I call?) You experience numbness and weakness that gets worse with time, as opposed to wearing off. New onset bowel or bladder incontinence. (Applies only to procedures done in the spine)  Emergency Numbers: Durning business hours (Monday - Thursday, 8:00 AM - 4:00 PM) (Friday, 9:00 AM - 12:00 Noon): [phone] After hours: [phone] NOTE: If you are having a problem and are unable connect with, or to talk to a provider, then go to your nearest urgent care or emergency department. If the problem is serious and urgent, please call 911. ____________________________________________________________________________________________   ____________________________________________________________________________________________  Post-Procedure Discharge Instructions  Instructions: Apply ice:  Purpose: This will minimize any swelling and discomfort after procedure.  When: Day of procedure, as soon as you get home. How: Fill a plastic sandwich bag with crushed ice. Cover it with a small towel and apply to injection site. How long: (15 min on, 15 min off) Apply for 15 minutes then remove x 15 minutes.  Repeat sequence on day of procedure, until you go to bed. Apply heat:  Purpose: To treat any soreness and discomfort from the  procedure. When: Starting the next day after the procedure. How: Apply heat to procedure site starting the day following the procedure. How long: May continue to repeat daily, until discomfort goes away. Food intake: Start with clear liquids (like water) and advance to regular food, as tolerated.  Physical activities: Keep activities to a minimum for the first 8 hours after the procedure. After that, then as tolerated. Driving: If you have received any sedation, be responsible and do not drive. You are not allowed to drive for 24 hours after having sedation. Blood thinner: (Applies only to those taking blood thinners) You may restart your blood thinner 6 hours after your procedure. Insulin: (Applies only to Diabetic patients taking insulin) As soon as you can eat, you may resume your normal dosing schedule. Infection prevention: Keep procedure site clean and dry. Shower daily and clean area with soap and water. Post-procedure Pain Diary: Extremely important that this be done correctly and accurately. Recorded information will be used to determine the next step in treatment. For the purpose of accuracy, follow these rules: Evaluate only the area treated. Do not report or include pain from an untreated area. For the purpose of this evaluation, ignore all other areas of pain, except for the treated area. After your procedure, avoid taking a long nap and attempting to complete the pain diary after you wake up. Instead, set your alarm clock to go off every hour, on the hour, for the initial 8 hours after the procedure. Document the duration of the numbing medicine, and the relief you are getting from it. Do not go to sleep and attempt   to complete it later. It will not be accurate. If you received sedation, it is likely that you were given a medication that may cause amnesia. Because of this, completing the diary at a later time may cause the information to be inaccurate. This information is needed to plan  your care. Follow-up appointment: Keep your post-procedure follow-up evaluation appointment after the procedure (usually 2 weeks for most procedures, 6 weeks for radiofrequencies). DO NOT FORGET to bring you pain diary with you.   Expect: (What should I expect to see with my procedure?) From numbing medicine (AKA: Local Anesthetics): Numbness or decrease in pain. You may also experience some weakness, which if present, could last for the duration of the local anesthetic. Onset: Full effect within 15 minutes of injected. Duration: It will depend on the type of local anesthetic used. On the average, 1 to 8 hours.  From steroids (Applies only if steroids were used): Decrease in swelling or inflammation. Once inflammation is improved, relief of the pain will follow. Onset of benefits: Depends on the amount of swelling present. The more swelling, the longer it will take for the benefits to be seen. In some cases, up to 10 days. Duration: Steroids will stay in the system x 2 weeks. Duration of benefits will depend on multiple posibilities including persistent irritating factors. Side-effects: If present, they may typically last 2 weeks (the duration of the steroids). Frequent: Cramps (if they occur, drink Gatorade and take over-the-counter Magnesium 450-500 mg once to twice a day); water retention with temporary weight gain; increases in blood sugar; decreased immune system response; increased appetite. Occasional: Facial flushing (red, warm cheeks); mood swings; menstrual changes. Uncommon: Long-term decrease or suppression of natural hormones; bone thinning. (These are more common with higher doses or more frequent use. This is why we prefer that our patients avoid having any injection therapies in other practices.)  Very Rare: Severe mood changes; psychosis; aseptic necrosis. From procedure: Some discomfort is to be expected once the numbing medicine wears off. This should be minimal if ice and heat are  applied as instructed.  Call if: (When should I call?) You experience numbness and weakness that gets worse with time, as opposed to wearing off. New onset bowel or bladder incontinence. (Applies only to procedures done in the spine)  Emergency Numbers: Durning business hours (Monday - Thursday, 8:00 AM - 4:00 PM) (Friday, 9:00 AM - 12:00 Noon): [phone] After hours: [phone] NOTE: If you are having a problem and are unable connect with, or to talk to a provider, then go to your nearest urgent care or emergency department. If the problem is serious and urgent, please call 911. ____________________________________________________________________________________________   

## 2021-06-26 NOTE — Progress Notes (Signed)
Safety precautions to be maintained throughout the outpatient stay will include: orient to surroundings, keep bed in low position, maintain call bell within reach at all times, provide assistance with transfer out of bed and ambulation.  

## 2021-06-26 NOTE — Progress Notes (Signed)
PROVIDER NOTE: Interpretation of information contained herein should be left to medically-trained personnel. Specific patient instructions are provided elsewhere under "Patient Instructions" section of medical record. This document was created in part using STT-dictation technology, any transcriptional errors that may result from this process are unintentional.  Patient: Autumn Valenzuela Type: Established DOB: November 19, 1960 MRN: [id number] PCP: Flossie Buffy, NP  Service: Procedure DOS: 06/26/2021 Setting: Ambulatory Location: Ambulatory outpatient facility Delivery: Face-to-face Provider: Gaspar Cola, MD Specialty: Interventional Pain Management Specialty designation: 09 Location: Outpatient facility Ref. Prov.: Nche, Charlene Brooke, NP    Primary Reason for Visit: Interventional Pain Management Treatment. CC: Back Pain (Lumbar bilateral )   Procedure:           Type: Lumbar Facet, Medial Branch Block(s) #1 (NO STEROIDS) Laterality: Bilateral  Level: L2, L3, L4, L5, & S1 Medial Branch Level(s). Injecting these levels blocks the L3-4 and L5-S1 lumbar facet joints. Imaging: Fluoroscopic guidance Anesthesia: Local anesthesia (1-2% Lidocaine) Anxiolysis: IV Versed 2.0 mg Sedation: Moderate conscious sedation. DOS: 06/26/2021 Performed by: Gaspar Cola, MD  Primary Purpose: Diagnostic/Therapeutic Indications: Low back pain severe enough to impact quality of life or function. 1. Lumbar facet syndrome   2. Spondylosis without myelopathy or radiculopathy, lumbosacral region   3. Lumbosacral facet arthropathy (Multilevel)   4. Chronic low back pain (1ry area of Pain) (Bilateral) (L>R) w/o sciatica   5. 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   Position / Prep / Materials:  Position: Prone  Prep solution: DuraPrep (Iodine Povacrylex [0.7% available iodine] and Isopropyl Alcohol, 74% w/w) Area Prepped: Posterolateral Lumbosacral Spine (Wide prep: From the lower border of the scapula down to the end of the tailbone and from flank to flank.)  Materials:  Tray: Block Needle(s):  Type: Spinal  Gauge (G): 22  Length: 5-in Qty: 4  Pre-op H&P Assessment:  Autumn Valenzuela is a 61 y.o. (year old), female patient, seen today for interventional treatment. She  has a past surgical history that includes Back surgery; Partial hysterectomy; Shoulder surgery; Knee surgery; RADICULOPATHY (Left); NEUROFORAMINAL STENOSIS (Left); Eye surgery (Bilateral); DIABETES; Anterior lateral lumbar fusion with percutaneous screw 1 level (Left, 02/11/2018); l 4-l5 status post lateral posterior fusion January 30,2020; Abdominal hysterectomy; ABDOMINAL AORTOGRAM W/LOWER EXTREMITY (N/A, 02/08/2021); and Endarterectomy femoral (Left, 02/12/2021). Autumn Valenzuela has a current medication list which includes the following prescription(s): accu-chek fastclix lancets, albuterol, albuterol, aspirin ec, accu-chek guide control, blood glucose meter kit and supplies, bupropion, vitamin d3, dexcom g6 sensor, dexcom g6 transmitter, diclofenac sodium, duloxetine, empagliflozin, ferrous sulfate, fluticasone, gabapentin, hydrocodone-acetaminophen, novolog flexpen, basaglar kwikpen, insulin pen needle, metformin, onetouch verio, rosuvastatin, and ozempic (0.25 or 0.5 mg/dose), and the following Facility-Administered Medications: fentanyl. Her primarily concern today is the Back Pain (Lumbar bilateral )  Initial Vital Signs:  Pulse/HCG Rate: 85ECG Heart Rate: 84 Temp: (!) 97.3 F (36.3 C) Resp: 16 BP: 112/85 SpO2: 97 %  BMI: Estimated body mass index is 37.54 kg/m as  calculated from the following:   Height as of this encounter: 5' 1" (1.549 m).   Weight as of this encounter: 198 lb 11.2 oz (90.1 kg).  Risk Assessment: Allergies: Reviewed. She has  No Known Allergies.  Allergy Precautions: None required Coagulopathies: Reviewed. None identified.  Blood-thinner therapy: None at this time Active Infection(s): Reviewed. None identified. Autumn Valenzuela is afebrile  Site Confirmation: Autumn Valenzuela was asked to confirm the procedure and laterality before marking the site Procedure checklist: Completed Consent: Before the procedure and under the influence of no sedative(s), amnesic(s), or anxiolytics, the patient was informed of the treatment options, risks and possible complications. To fulfill our ethical and legal obligations, as recommended by the American Medical Association's Code of Ethics, I have informed the patient of my clinical impression; the nature and purpose of the treatment or procedure; the risks, benefits, and possible complications of the intervention; the alternatives, including doing nothing; the risk(s) and benefit(s) of the alternative treatment(s) or procedure(s); and the risk(s) and benefit(s) of doing nothing. The patient was provided information about the general risks and possible complications associated with the procedure. These may include, but are not limited to: failure to achieve desired goals, infection, bleeding, organ or nerve damage, allergic reactions, paralysis, and death. In addition, the patient was informed of those risks and complications associated to Spine-related procedures, such as failure to decrease pain; infection (i.e.: Meningitis, epidural or intraspinal abscess); bleeding (i.e.: epidural hematoma, subarachnoid hemorrhage, or any other type of intraspinal or peri-dural bleeding); organ or nerve damage (i.e.: Any type of peripheral nerve, nerve root, or spinal cord injury) with subsequent damage to sensory, motor, and/or  autonomic systems, resulting in permanent pain, numbness, and/or weakness of one or several areas of the body; allergic reactions; (i.e.: anaphylactic reaction); and/or death. Furthermore, the patient was informed of those risks and complications associated with the medications. These include, but are not limited to: allergic reactions (i.e.: anaphylactic or anaphylactoid reaction(s)); adrenal axis suppression; blood sugar elevation that in diabetics may result in ketoacidosis or comma; water retention that in patients with history of congestive heart failure may result in shortness of breath, pulmonary edema, and decompensation with resultant heart failure; weight gain; swelling or edema; medication-induced neural toxicity; particulate matter embolism and blood vessel occlusion with resultant organ, and/or nervous system infarction; and/or aseptic necrosis of one or more joints. Finally, the patient was informed that Medicine is not an exact science; therefore, there is also the possibility of unforeseen or unpredictable risks and/or possible complications that may result in a catastrophic outcome. The patient indicated having understood very clearly. We have given the patient no guarantees and we have made no promises. Enough time was given to the patient to ask questions, all of which were answered to the patient's satisfaction. Autumn Valenzuela has indicated that she wanted to continue with the procedure. Attestation: I, the ordering provider, attest that I have discussed with the patient the benefits, risks, side-effects, alternatives, likelihood of achieving goals, and potential problems during recovery for the procedure that I have provided informed consent. Date  Time: 06/26/2021  9:23 AM  Pre-Procedure Preparation:  Monitoring: As per clinic protocol. Respiration, ETCO2, SpO2, BP, heart rate and rhythm monitor placed and checked for adequate function Safety Precautions: Patient was assessed for positional  comfort and pressure points before starting the procedure. Time-out: I initiated and conducted the "Time-out" before starting the procedure, as per protocol. The patient was asked to participate by confirming the accuracy of the "Time Out" information. Verification of the correct person, site, and procedure were performed and confirmed by me, the nursing staff, and the patient. "Time-out" conducted as per Joint Commission's Universal Protocol (UP.01.01.01). Time: 1000  Description of  Procedure:          Laterality: Bilateral. The procedure was performed in identical fashion on both sides. Targeted Levels:  L2, L3, L4, L5, & S1 Medial Branch Level(s)  Safety Precautions: Aspiration looking for blood return was conducted prior to all injections. At no point did we inject any substances, as a needle was being advanced. Before injecting, the patient was told to immediately notify me if she was experiencing any new onset of "ringing in the ears, or metallic taste in the mouth". No attempts were made at seeking any paresthesias. Safe injection practices and needle disposal techniques used. Medications properly checked for expiration dates. SDV (single dose vial) medications used. After the completion of the procedure, all disposable equipment used was discarded in the proper designated medical waste containers. Local Anesthesia: Protocol guidelines were followed. The patient was positioned over the fluoroscopy table. The area was prepped in the usual manner. The time-out was completed. The target area was identified using fluoroscopy. A 12-in long, straight, sterile hemostat was used with fluoroscopic guidance to locate the targets for each level blocked. Once located, the skin was marked with an approved surgical skin marker. Once all sites were marked, the skin (epidermis, dermis, and hypodermis), as well as deeper tissues (fat, connective tissue and muscle) were infiltrated with a small amount of a  short-acting local anesthetic, loaded on a 10cc syringe with a 25G, 1.5-in  Needle. An appropriate amount of time was allowed for local anesthetics to take effect before proceeding to the next step. Local Anesthetic: Lidocaine 2.0% The unused portion of the local anesthetic was discarded in the proper designated containers. Technical description of process:  L2 Medial Branch Nerve Block (MBB): The target area for the L2 medial branch is at the junction of the postero-lateral aspect of the superior articular process and the superior, posterior, and medial edge of the transverse process of L3. Under fluoroscopic guidance, a Quincke needle was inserted until contact was made with os over the superior postero-lateral aspect of the pedicular shadow (target area). After negative aspiration for blood, 0.5 mL of the nerve block solution was injected without difficulty or complication. The needle was removed intact. L3 Medial Branch Nerve Block (MBB): The target area for the L3 medial branch is at the junction of the postero-lateral aspect of the superior articular process and the superior, posterior, and medial edge of the transverse process of L4. Under fluoroscopic guidance, a Quincke needle was inserted until contact was made with os over the superior postero-lateral aspect of the pedicular shadow (target area). After negative aspiration for blood, 0.5 mL of the nerve block solution was injected without difficulty or complication. The needle was removed intact. L4 Medial Branch Nerve Block (MBB): The target area for the L4 medial branch is at the junction of the postero-lateral aspect of the superior articular process and the superior, posterior, and medial edge of the transverse process of L5. Under fluoroscopic guidance, a Quincke needle was inserted until contact was made with os over the superior postero-lateral aspect of the pedicular shadow (target area). After negative aspiration for blood, 0.5 mL of the  nerve block solution was injected without difficulty or complication. The needle was removed intact. L5 Medial Branch Nerve Block (MBB): The target area for the L5 medial branch is at the junction of the postero-lateral aspect of the superior articular process and the superior, posterior, and medial edge of the sacral ala. Under fluoroscopic guidance, a Quincke needle was inserted until contact  was made with os over the superior postero-lateral aspect of the pedicular shadow (target area). After negative aspiration for blood, 0.5 mL of the nerve block solution was injected without difficulty or complication. The needle was removed intact. S1 Medial Branch Nerve Block (MBB): The target area for the S1 medial branch is at the posterior and inferior 6 o'clock position of the L5-S1 facet joint. Under fluoroscopic guidance, the Quincke needle inserted for the L5 MBB was redirected until contact was made with os over the inferior and postero aspect of the sacrum, at the 6 o' clock position under the L5-S1 facet joint (Target area). After negative aspiration for blood, 0.5 mL of the nerve block solution was injected without difficulty or complication. The needle was removed intact.  Once the entire procedure was completed, the treated area was cleaned, making sure to leave some of the prepping solution back to take advantage of its long term bactericidal properties.      Illustration of the posterior view of the lumbar spine and the posterior neural structures. Laminae of L2 through S1 are labeled. DPRL5, dorsal primary ramus of L5; DPRS1, dorsal primary ramus of S1; DPR3, dorsal primary ramus of L3; FJ, facet (zygapophyseal) joint L3-L4; I, inferior articular process of L4; LB1, lateral branch of dorsal primary ramus of L1; IAB, inferior articular branches from L3 medial branch (supplies L4-L5 facet joint); IBP, intermediate branch plexus; MB3, medial branch of dorsal primary ramus of L3; NR3, third lumbar nerve  root; S, superior articular process of L5; SAB, superior articular branches from L4 (supplies L4-5 facet joint also); TP3, transverse process of L3.  Vitals:   06/26/21 1015 06/26/21 1023 06/26/21 1032 06/26/21 1041  BP: 115/85 109/84 110/75 107/72  Pulse:      Resp: _0 Temp:  (!) 97 F (36.1 C)  (!) 97.2 F (36.2 C)  TempSrc:      SpO2: 99% 95% 97% 97%  Weight:      Height:         Start Time: 1000 hrs. End Time: 1015 hrs.  Imaging Guidance (Spinal):          Type of Imaging Technique: Fluoroscopy Guidance (Spinal) Indication(s): Assistance in needle guidance and placement for procedures requiring needle placement in or near specific anatomical locations not easily accessible without such assistance. Exposure Time: Please see nurses notes. Contrast: None used. Fluoroscopic Guidance: I was personally present during the use of fluoroscopy. "Tunnel Vision Technique" used to obtain the best possible view of the target area. Parallax error corrected before commencing the procedure. "Direction-depth-direction" technique used to introduce the needle under continuous pulsed fluoroscopy. Once target was reached, antero-posterior, oblique, and lateral fluoroscopic projection used confirm needle placement in all planes. Images permanently stored in EMR. Interpretation: No contrast injected. I personally interpreted the imaging intraoperatively. Adequate needle placement confirmed in multiple planes. Permanent images saved into the patient's record.  Antibiotic Prophylaxis:   Anti-infectives (From admission, onward)    None      Indication(s): None identified  Post-operative Assessment:  Post-procedure Vital Signs:  Pulse/HCG Rate: 8576 Temp: (!) 97.2 F (36.2 C) Resp: 16 BP: 107/72 SpO2: 97 %  EBL: None  Complications: No immediate post-treatment complications observed by team, or reported by patient.  Note: The patient tolerated the entire procedure well. A repeat  set of vitals were taken after the procedure and the patient was kept under observation following institutional policy, for this type of procedure. Post-procedural neurological assessment was  performed, showing return to baseline, prior to discharge. The patient was provided with post-procedure discharge instructions, including a section on how to identify potential problems. Should any problems arise concerning this procedure, the patient was given instructions to immediately contact us, at any time, without hesitation. In any case, we plan to contact the patient by telephone for a follow-up status report regarding this interventional procedure.  Comments:  No additional relevant information.  Plan of Care  Orders:  Orders Placed This Encounter  Procedures   LUMBAR FACET(MEDIAL BRANCH NERVE BLOCK) MBNB    Scheduling Instructions:     Procedure: Lumbar facet block (AKA.: Lumbosacral medial branch nerve block)     Side: Bilateral     Level: L3-4 & L5-S1 Facets (L2, L3, L4, L5, & S1 Medial Branch Nerves)     Sedation: Patient's choice.     Timeframe: Today    Order Specific Question:   Where will this procedure be performed?    Answer:   [facility]   DG [facility] C-ARM 1-60 MIN NO REPORT    Intraoperative interpretation by procedural physician at [place].    Standing Status:   Standing    Number of Occurrences:   1    Order Specific Question:   Reason for exam:    Answer:   Assistance in needle guidance and placement for procedures requiring needle placement in or near specific anatomical locations not easily accessible without such assistance.   Informed Consent Details: Physician/Practitioner Attestation; Transcribe to consent form and obtain patient signature    Nursing Order: Transcribe to consent form and obtain patient signature. Note: Always confirm laterality of pain with Autumn Valenzuela, before procedure.    Order Specific Question:   Physician/Practitioner  attestation of informed consent for procedure/surgical case    Answer:   I, the physician/practitioner, attest that I have discussed with the patient the benefits, risks, side effects, alternatives, likelihood of achieving goals and potential problems during recovery for the procedure that I have provided informed consent.    Order Specific Question:   Procedure    Answer:   Lumbar Facet Block  under fluoroscopic guidance    Order Specific Question:   Physician/Practitioner performing the procedure    Answer:   Valor Quaintance A. Dossie Arbour MD    Order Specific Question:   Indication/Reason    Answer:   Low Back Pain, with our without leg pain, due to Facet Joint Arthralgia (Joint Pain) Spondylosis (Arthritis of the Spine), without myelopathy or radiculopathy (Nerve Damage).   Provide equipment / supplies at bedside    "Block Tray" (Disposable  single use) Needle type: SpinalSpinal Amount/quantity: 4 Size: Medium (5-inch) Gauge: 22G    Standing Status:   Standing    Number of Occurrences:   1    Order Specific Question:   Specify    Answer:   Block Tray   Chronic Opioid Analgesic:  Hydrocodone/APAP 5/325 1 tab p.o. twice daily (last filled on 05/07/2021) (prescribed by Charyl Bigger Nche, ANP) MME/day: 10 mg/day   Medications ordered for procedure: Meds ordered this encounter  Medications   lidocaine (XYLOCAINE) 2 % (with pres) injection 400 mg   lactated ringers infusion   midazolam (VERSED) 5 MG/5ML injection 0.5-2 mg    Make sure Flumazenil is available in the pyxis when using this medication. If oversedation occurs, administer 0.2 mg IV over 15 sec. If after 45 sec no response, administer 0.2 mg again over 1 min; may repeat at 1  min intervals; not to exceed 4 doses (1 mg)   fentaNYL (SUBLIMAZE) injection 25-50 mcg    Make sure Narcan is available in the pyxis when using this medication. In the event of respiratory depression (RR< 8/min): Titrate NARCAN (naloxone) in increments of 0.1 to 0.2  mg IV at 2-3 minute intervals, until desired degree of reversal.   ropivacaine (PF) 2 mg/mL (0.2%) (NAROPIN) injection 18 mL   DISCONTD: triamcinolone acetonide (KENALOG-40) injection 80 mg   Medications administered: We administered lidocaine, lactated ringers, midazolam, fentaNYL, and ropivacaine (PF) 2 mg/mL (0.2%).  See the medical record for exact dosing, route, and time of administration.  Follow-up plan:   Return in about 2 weeks (around 07/10/2021) for Proc-day (T,Th), (F2F), (PPE).       Interventional Therapies  Risk  Complexity Considerations:   Estimated body mass index is 38.55 kg/m as calculated from the following:   Height as of this encounter: 5' 1" (1.549 m).   Weight as of this encounter: 204 lb (92.5 kg). IDDMM w/ complications & Hx of ketoacidosis PVD   Planned  Pending:   Diagnostic bilateral lumbar facet MBB #1 (without steroids)    Under consideration:   Diagnostic bilateral lumbar facet MBB #1 (without steroids)  Diagnostic bilateral IA hip joint inj. #1  Diagnostic right IA knee joint inj. #1  Diagnostic left genicular NB #1    Completed:   None at this time   Therapeutic  Palliative (PRN) options:   None established     Recent Visits Date Type Provider Dept  05/14/21 Office Visit Milinda Pointer, MD [facility]  Showing recent visits within past 90 days and meeting all other requirements Today's Visits Date Type Provider Dept  06/26/21 Procedure visit Milinda Pointer, MD [facility]  Showing today's visits and meeting all other requirements Future Appointments Date Type Provider Dept  07/12/21 Appointment Milinda Pointer, MD [facility]  Showing future appointments within next 90 days and meeting all other requirements  Disposition: Discharge home  Discharge (Date  Time): 06/26/2021; 1043 hrs.   Primary Care Physician: Flossie Buffy, NP Location: [facility] Note  by: Gaspar Cola, MD Date: 06/26/2021; Time: 4:37 PM  Disclaimer:  Medicine is not an Chief Strategy Officer. The only guarantee in medicine is that nothing is guaranteed. It is important to note that the decision to proceed with this intervention was based on the information collected from the patient. The Data and conclusions were drawn from the patient's questionnaire, the interview, and the physical examination. Because the information was provided in large part by the patient, it cannot be guaranteed that it has not been purposely or unconsciously manipulated. Every effort has been made to obtain as much relevant data as possible for this evaluation. It is important to note that the conclusions that lead to this procedure are derived in large part from the available data. Always take into account that the treatment will also be dependent on availability of resources and existing treatment guidelines, considered by other Pain Management Practitioners as being common knowledge and practice, at the time of the intervention. For Medico-Legal purposes, it is also important to point out that variation in procedural techniques and pharmacological choices are the acceptable norm. The indications, contraindications, technique, and results of the above procedure should only be interpreted and judged by a Board-Certified Interventional Pain Specialist with extensive familiarity and expertise in the same exact procedure and technique.

## 2021-06-27 ENCOUNTER — Telehealth: Payer: Self-pay

## 2021-06-27 ENCOUNTER — Encounter: Payer: Self-pay | Admitting: Nurse Practitioner

## 2021-06-27 ENCOUNTER — Ambulatory Visit (INDEPENDENT_AMBULATORY_CARE_PROVIDER_SITE_OTHER): Payer: Medicare HMO | Admitting: Nurse Practitioner

## 2021-06-27 DIAGNOSIS — E782 Mixed hyperlipidemia: Secondary | ICD-10-CM

## 2021-06-27 DIAGNOSIS — F3175 Bipolar disorder, in partial remission, most recent episode depressed: Secondary | ICD-10-CM | POA: Diagnosis not present

## 2021-06-27 DIAGNOSIS — R69 Illness, unspecified: Secondary | ICD-10-CM | POA: Diagnosis not present

## 2021-06-27 MED ORDER — ROSUVASTATIN CALCIUM 20 MG PO TABS
ORAL_TABLET | ORAL | 3 refills | Status: DC
Start: 2021-06-27 — End: 2022-05-07

## 2021-06-27 MED ORDER — DULOXETINE HCL 60 MG PO CPEP: 60.0000 mg | ORAL_CAPSULE | Freq: Two times a day (BID) | ORAL | 3 refills | Status: DC

## 2021-06-27 NOTE — Progress Notes (Signed)
Established Patient Visit  Patient: Autumn Valenzuela   DOB: [birth year]   61 y.o. Female  MRN: [id number] Visit Date: 06/27/2021  Subjective:    Chief Complaint  Patient presents with   Office Visit    F/u Anxiety & Depression  Feels good today  No other concerns   HPI Bipolar disorder ([place]) Stable and improved mood Denies need for psychology referral. Denies any adverse effects with wellbutrin and cymbalta No SI/HI/hallucination.  Maintain med dose F/up in 2-28month     06/27/2021   10:40 AM 05/30/2021   10:05 AM 05/02/2021   10:17 AM  Depression screen PHQ 2/9  Decreased Interest 1 0 2  Down, Depressed, Hopeless '1 2 2  ' PHQ - 2 Score '2 2 4  ' Altered sleeping '1 3 3  ' Tired, decreased energy '1 1 3  ' Change in appetite '1 1 3  ' Feeling bad or failure about yourself  '1 1 3  ' Trouble concentrating '1 1 2  ' Moving slowly or fidgety/restless '1 1 3  ' Suicidal thoughts '1 1 3  ' PHQ-9 Score '9 11 24  ' Difficult doing work/chores Somewhat difficult Somewhat difficult Somewhat difficult       06/27/2021   10:41 AM 05/30/2021   10:05 AM 05/02/2021   10:17 AM  GAD 7 : Generalized Anxiety Score  Nervous, Anxious, on Edge '2 1 3  ' Control/stop worrying '2 3 3  ' Worry too much - different things '2 3 3  ' Trouble relaxing '2 1 3  ' Restless '2 3 3  ' Easily annoyed or irritable '2 3 3  ' Afraid - awful might happen '2 1 3  ' Total GAD 7 Score '14 15 21  ' Anxiety Difficulty Somewhat difficult Somewhat difficult Somewhat difficult   Reviewed medical, surgical, and social history today  Medications: Outpatient Medications Prior to Visit  Medication Sig   Accu-Chek FastClix Lancets MISC TEST 4 TIMES DAILY. DX E11.9   albuterol (PROVENTIL) (2.5 MG/3ML) 0.083% nebulizer solution Take 2.5 mg by nebulization every 6 (six) hours as needed for wheezing.   albuterol (VENTOLIN HFA) 108 (90 Base) MCG/ACT inhaler Inhale 2 puffs into the lungs daily as needed for wheezing or shortness of breath.   aspirin EC  81 MG EC tablet Take 1 tablet (81 mg total) by mouth daily at 6 (six) AM. Swallow whole.   Blood Glucose Calibration (ACCU-CHEK GUIDE CONTROL) LIQD 1 each by In Vitro route every 30 (thirty) days. Needs Accu-check guide monitor system. DX. E11.9 (Patient taking differently: 1 each by In Vitro route every 30 (thirty) days. Needs Accu-check guide monitor system. DX. E11.9 2 times daily)   blood glucose meter kit and supplies Dispense based on patient and insurance preference. Use up to four times daily as directed. (FOR ICD-10 E10.9, E11.9).   buPROPion (WELLBUTRIN) 75 MG tablet Take 1 tablet (75 mg total) by mouth 2 (two) times daily.   Cholecalciferol (VITAMIN D3) 1.25 MG (50000 UT) CAPS Take 1 capsule by mouth once a week.   Continuous Blood Gluc Sensor (DEXCOM G6 SENSOR) MISC 1 Device by Does not apply route as directed.   Continuous Blood Gluc Transmit (DEXCOM G6 TRANSMITTER) MISC 1 Device by Does not apply route as directed.   diclofenac Sodium (VOLTAREN) 1 % GEL Apply 2 g topically 4 (four) times daily. (Patient taking differently: Apply 2 g topically 4 (four) times daily as needed (pain).)   empagliflozin (JARDIANCE) 25 MG TABS tablet Take  1 tablet (25 mg total) by mouth daily.   ferrous sulfate 325 (65 FE) MG tablet Take 1 tablet (325 mg total) by mouth every other day. (Patient taking differently: Take 325 mg by mouth daily.)   fluticasone (FLONASE) 50 MCG/ACT nasal spray Place 2 sprays into both nostrils daily as needed for allergies.   gabapentin (NEURONTIN) 300 MG capsule Take 1 capsule (300 mg total) by mouth 3 (three) times daily. (Patient taking differently: Take 300 mg by mouth 2 (two) times daily.)   HYDROcodone-acetaminophen (NORCO/VICODIN) 5-325 MG tablet Take 1 tablet by mouth every 12 (twelve) hours as needed for moderate pain.   insulin aspart (NOVOLOG FLEXPEN) 100 UNIT/ML FlexPen Max daily 30 units (Patient taking differently: Inject 1-4 Units into the skin 3 (three) times daily as  needed (blood sugar over 150). Max daily 30 units)   Insulin Glargine (BASAGLAR KWIKPEN) 100 UNIT/ML Inject 26 Units into the skin at bedtime.   Insulin Pen Needle 31G X 5 MM MISC 1 Units by Does not apply route in the morning, at noon, in the evening, and at bedtime.   metFORMIN (GLUCOPHAGE-XR) 500 MG 24 hr tablet Take 1 tablet (500 mg total) by mouth in the morning and at bedtime.   ONETOUCH VERIO test strip CHECK UP TO 4 TIMES A DAY AS DIRECTED   Semaglutide,0.25 or 0.5MG/DOS, (OZEMPIC, 0.25 OR 0.5 MG/DOSE,) 2 MG/3ML SOPN Inject 0.5 mg into the skin once a week.   [DISCONTINUED] DULoxetine (CYMBALTA) 60 MG capsule Take 1 capsule (60 mg total) by mouth 2 (two) times daily.   [DISCONTINUED] rosuvastatin (CRESTOR) 20 MG tablet TAKE 1 TABLET(20 MG) BY MOUTH DAILY   No facility-administered medications prior to visit.   Reviewed past medical and social history.   ROS per HPI above      Objective:  BP 124/80 (BP Location: Left Arm, Patient Position: Sitting, Cuff Size: Normal)   Pulse 85   Temp (!) 96.5 F (35.8 C) (Temporal)   Ht '5\' 1"'  (1.549 m)   Wt 201 lb 6.4 oz (91.4 kg)   SpO2 95%   BMI 38.05 kg/m      Physical Exam  No results found for any visits on 06/27/21.    Assessment & Plan:    Problem List Items Addressed This Visit       Other   Bipolar disorder ([place])    Stable and improved mood Denies need for psychology referral. Denies any adverse effects with wellbutrin and cymbalta No SI/HI/hallucination.  Maintain med dose F/up in 2-67month      Relevant Medications   DULoxetine (CYMBALTA) 60 MG capsule   HLD (hyperlipidemia)   Relevant Medications   rosuvastatin (CRESTOR) 20 MG tablet   Return in about 2 months (around 08/27/2021) for HTN, hyperlipidemia, Depression (fasting).     CWilfred Lacy NP

## 2021-06-27 NOTE — Progress Notes (Cosign Needed)
Chronic Care Management APPOINTMENT REMINDER   Called ARLETH[facility] MARSCHKE, No answer, left message of appointment on 06/28/2021 at 9:15 am via telephone visit with Junius Argyle , Pharm D. Notified to have all medications, supplements, blood pressure and/or blood sugar logs available during appointment and to return call if need to reschedule.      [facility] Pharmacist Assistant [phone]

## 2021-06-27 NOTE — Telephone Encounter (Signed)
Post procedure phone call. Patient states she is doing good.  

## 2021-06-27 NOTE — Assessment & Plan Note (Signed)
Stable and improved mood Denies need for psychology referral. Denies any adverse effects with wellbutrin and cymbalta No SI/HI/hallucination.  Maintain med dose F/up in 2-31month

## 2021-06-27 NOTE — Patient Instructions (Signed)
Ok to get TDAP vaccine  Maintain current medication dose. F/up in 23month

## 2021-06-28 ENCOUNTER — Ambulatory Visit (INDEPENDENT_AMBULATORY_CARE_PROVIDER_SITE_OTHER): Payer: Medicare HMO

## 2021-06-28 DIAGNOSIS — E782 Mixed hyperlipidemia: Secondary | ICD-10-CM

## 2021-06-28 DIAGNOSIS — E1142 Type 2 diabetes mellitus with diabetic polyneuropathy: Secondary | ICD-10-CM

## 2021-06-28 NOTE — Progress Notes (Unsigned)
Chronic Care Management Pharmacy Note  07/02/2021 Name:  Autumn Valenzuela MRN:  [id number] DOB:  11-Aug-1960  Summary: [name]   Recommendations/Changes made from today's visit: Continue current medications  Plan: CPP follow-up one month   Subjective: Autumn Valenzuela is an 61 y.o. year old female who is a primary patient of Nche, Charlene Brooke, NP.  The CCM team was consulted for assistance with disease management and care coordination needs.    Engaged with patient by telephone for follow up visit in response to provider referral for pharmacy case management and/or care coordination services.   Consent to Services:  The patient was given information about Chronic Care Management services, agreed to services, and gave verbal consent prior to initiation of services.  Please see initial visit note for detailed documentation.   Patient Care Team: Nche, Charlene Brooke, NP as PCP - General (Internal Medicine) Germaine Pomfret, [facility] as Pharmacist (Pharmacist)  Recent office visits: 06/27/21: Patient presented to Wilfred Lacy, NP for follow-up.  05/30/21: Patient presented to Wilfred Lacy, NP for follow-up. Duloxetine 60 mg twice daily  05/02/21: Patient presented to Wilfred Lacy, NP for chronic pain. Gabapentin 300 mg TID, Bupropion 75 mg twice daily.  04/02/2021 Vance Peper NP (PCP Office) Start Doxycycline 100 mg 2 times daily 01/17/2021 Wilfred Lacy NP (PCP) Change Gabapentin 300 mg 2 times daily, Follow up 6 months  Recent consult visits: 05/18/2021 Dr. Kelton Pillar MD (Endocrinology): ozempic 0.5 mg weekly  04/10/2021  Dr. Burney Gauze MD (Orthopedic Surgery) Start Methylprednisolone 4 mg tablet for 6 days  03/13/2021  Dr. Carlis Abbott MD (Vascular Surgery)No Medication Changes noted 02/28/2021 Dr. Dossie Arbour MD (Pain Medicine) No Medication Changes noted 02/07/2021 Dr. Dione Booze MD (Neurology) No medication Changes noted 01/24/2021 Dr.  Kelton Pillar MD (Endocrinology) Ambulatory referral to Vascular Surgery 01/23/2021 Kary Kos MD (Neurosurgery) Unable to see note 01/19/2021 Dr. Kelton Pillar MD (Endocrinology) No Medication Changes noted  Hospital visits: Admitted to the hospital on 02/12/2021 due to PAD. Discharge date was 02/13/2021. Discharged from [facility].    Objective:  Lab Results  Component Value Date   CREATININE 0.65 02/13/2021   BUN 8 02/13/2021   GFR 95.70 01/17/2021   GFRNONAA >60 02/13/2021   GFRAA >60 02/09/2018   NA 139 02/13/2021   K 3.7 02/13/2021   CALCIUM 8.6 (L) 02/13/2021   CO2 23 02/13/2021   GLUCOSE 148 (H) 02/13/2021    Lab Results  Component Value Date/Time   HGBA1C 9.2 (A) 05/18/2021 08:11 AM   HGBA1C 8.7 (H) 02/12/2021 01:14 PM   HGBA1C 7.9 (H) 11/28/2020 09:30 AM   GFR 95.70 01/17/2021 08:36 AM   GFR 92.44 09/05/2020 09:39 AM   MICROALBUR 1.4 01/17/2021 08:36 AM   MICROALBUR <0.7 09/22/2019 01:27 PM    Last diabetic Eye exam:  Lab Results  Component Value Date/Time   HMDIABEYEEXA No Retinopathy 06/14/2020 12:00 AM    Last diabetic Foot exam: No results found for: "HMDIABFOOTEX"   Lab Results  Component Value Date   CHOL 175 02/13/2021   HDL 37 (L) 02/13/2021   LDLCALC 123 (H) 02/13/2021   TRIG 75 02/13/2021   CHOLHDL 4.7 02/13/2021       Latest Ref Rng & Units 02/12/2021    8:21 AM 01/17/2021    8:36 AM 05/27/2020   10:30 AM  Hepatic Function  Total Protein 6.5 - 8.1 g/dL 6.6  7.1  7.0   Albumin 3.5 - 5.0 g/dL 3.4  4.0  3.6   AST 15 - 41 U/L '10  10  11   ' ALT 0 - 44 U/L '10  9  9   ' Alk Phosphatase 38 - 126 U/L 57  63  60   Total Bilirubin 0.3 - 1.2 mg/dL 0.3  0.4  0.4   Bilirubin, Direct 0.0 - 0.3 mg/dL  0.1      Lab Results  Component Value Date/Time   TSH 0.51 02/04/2019 02:45 PM   TSH 0.43 04/30/2017 09:02 AM   FREET4 1.08 03/24/2016 11:59 AM       Latest Ref Rng & Units 05/02/2021   10:10 AM 04/02/2021    3:37 PM 02/13/2021    2:50 AM   CBC  WBC 4.0 - 10.5 K/uL 10.7  14.5  12.7   Hemoglobin 12.0 - 15.0 g/dL 12.9  12.4  10.5   Hematocrit 36.0 - 46.0 % 40.3  38.9  32.4   Platelets 150.0 - 400.0 K/uL 224.0  229.0  197     Lab Results  Component Value Date/Time   VD25OH 31.23 01/17/2021 08:36 AM   VD25OH 47.98 09/22/2019 01:27 PM    Clinical ASCVD: No  The 10-year ASCVD risk score (Arnett DK, et al., 2019) is: 10.7%   Values used to calculate the score:     Age: 61 years     Sex: Female     Is Non-Hispanic African American: Yes     Diabetic: Yes     Tobacco smoker: No     Systolic Blood Pressure: 811 mmHg     Is BP treated: No     HDL Cholesterol: 37 mg/dL     Total Cholesterol: 175 mg/dL       06/27/2021   10:40 AM 05/30/2021   10:05 AM 05/02/2021   10:17 AM  Depression screen PHQ 2/9  Decreased Interest 1 0 2  Down, Depressed, Hopeless '1 2 2  ' PHQ - 2 Score '2 2 4  ' Altered sleeping '1 3 3  ' Tired, decreased energy '1 1 3  ' Change in appetite '1 1 3  ' Feeling bad or failure about yourself  '1 1 3  ' Trouble concentrating '1 1 2  ' Moving slowly or fidgety/restless '1 1 3  ' Suicidal thoughts '1 1 3  ' PHQ-9 Score '9 11 24  ' Difficult doing work/chores Somewhat difficult Somewhat difficult Somewhat difficult     Social History   Tobacco Use  Smoking Status Former   Types: Cigarettes   Quit date: 12/13/2015   Years since quitting: 5.5  Smokeless Tobacco Never   BP Readings from Last 3 Encounters:  06/27/21 124/80  06/26/21 107/72  05/30/21 130/82   Pulse Readings from Last 3 Encounters:  06/27/21 85  06/26/21 85  05/30/21 70   Wt Readings from Last 3 Encounters:  06/27/21 201 lb 6.4 oz (91.4 kg)  06/26/21 198 lb 11.2 oz (90.1 kg)  05/30/21 202 lb 6.4 oz (91.8 kg)   BMI Readings from Last 3 Encounters:  06/27/21 38.05 kg/m  06/26/21 37.54 kg/m  05/30/21 38.24 kg/m    Assessment/Interventions: Review of patient past medical history, allergies, medications, health status, including review of  consultants reports, laboratory and other test data, was performed as part of comprehensive evaluation and provision of chronic care management services.   SDOH:  (Social Determinants of Health) assessments and interventions performed: Yes   SDOH Screenings   Alcohol Screen: Low Risk  (10/24/2020)   Alcohol Screen    Last Alcohol Screening Score (  AUDIT): 0  Depression (PHQ2-9): Medium Risk (06/27/2021)   Depression (PHQ2-9)    PHQ-2 Score: 9  Financial Resource Strain: Low Risk  (05/03/2021)   Overall Financial Resource Strain (CARDIA)    Difficulty of Paying Living Expenses: Not hard at all  Food Insecurity: No Food Insecurity (10/24/2020)   Hunger Vital Sign    Worried About Running Out of Food in the Last Year: Never true    Ran Out of Food in the Last Year: Never true  Housing: Low Risk  (10/24/2020)   Housing    Last Housing Risk Score: 0  Physical Activity: Inactive (10/24/2020)   Exercise Vital Sign    Days of Exercise per Week: 0 days    Minutes of Exercise per Session: 0 min  Social Connections: Socially Isolated (10/24/2020)   Social Connection and Isolation Panel [NHANES]    Frequency of Communication with Friends and Family: Three times a week    Frequency of Social Gatherings with Friends and Family: More than three times a week    Attends Religious Services: Never    Marine scientist or Organizations: No    Attends Archivist Meetings: Never    Marital Status: Separated  Stress: Stress Concern Present (10/24/2020)   [employer] [place]    Feeling of Stress : To some extent  Tobacco Use: Medium Risk (06/29/2021)   Patient History    Smoking Tobacco Use: Former    Smokeless Tobacco Use: Never    Passive Exposure: Not on file  Transportation Needs: No Transportation Needs (10/24/2020)   PRAPARE - Hydrologist (Medical): No    Lack of Transportation (Non-Medical):  No    CCM Care Plan  No Known Allergies  Medications Reviewed Today     Reviewed by Flossie Buffy, NP (Nurse Practitioner) on 06/27/21 at 1045  Med List Status: <None>   Medication Order Taking? Sig Documenting Provider Last Dose Status Informant  Accu-Chek FastClix Lancets MISC [id number] Yes TEST 4 TIMES DAILY. DX E11.9 Nche, Charlene Brooke, NP Taking Active Self  albuterol (PROVENTIL) (2.5 MG/3ML) 0.083% nebulizer solution [id number] Yes Take 2.5 mg by nebulization every 6 (six) hours as needed for wheezing. [provider] Taking Active Self  albuterol (VENTOLIN HFA) 108 (90 Base) MCG/ACT inhaler [id number] Yes Inhale 2 puffs into the lungs daily as needed for wheezing or shortness of breath. McElwee, Lauren A, NP Taking Active   aspirin EC 81 MG EC tablet [id number] Yes Take 1 tablet (81 mg total) by mouth daily at 6 (six) AM. Swallow whole. Baglia, Corrina, PA-C Taking Active   Blood Glucose Calibration (ACCU-CHEK GUIDE CONTROL) LIQD [id number] Yes 1 each by In Vitro route every 30 (thirty) days. Needs Accu-check guide monitor system. DX. E11.9  Patient taking differently: 1 each by In Vitro route every 30 (thirty) days. Needs Accu-check guide monitor system. DX. E11.9 2 times daily   Elby Beck, FNP Taking Active Self  blood glucose meter kit and supplies [id number] Yes Dispense based on patient and insurance preference. Use up to four times daily as directed. (FOR ICD-10 E10.9, E11.9). Shamleffer, Melanie Crazier, MD Taking Active Self  buPROPion [facility]) 75 MG tablet [id number] Yes Take 1 tablet (75 mg total) by mouth 2 (two) times daily. Nche, Charlene Brooke, NP Taking Active   Cholecalciferol (VITAMIN D3) 1.25 MG (50000 UT) CAPS [id number] Yes Take 1 capsule by mouth once a week. Tonia Ghent,  MD Taking Active Self  Continuous Blood Gluc Sensor (DEXCOM G6 SENSOR) MISC [id number] Yes 1 Device by Does not apply route as directed. Shamleffer, Melanie Crazier, MD  Taking Active   Continuous Blood Gluc Transmit (DEXCOM G6 TRANSMITTER) MISC [id number] Yes 1 Device by Does not apply route as directed. Shamleffer, Melanie Crazier, MD Taking Active   diclofenac Sodium (VOLTAREN) 1 % GEL [id number] Yes Apply 2 g topically 4 (four) times daily.  Patient taking differently: Apply 2 g topically 4 (four) times daily as needed (pain).   Nche, Charlene Brooke, NP Taking Active Self  DULoxetine (CYMBALTA) 60 MG capsule [id number]  Take 1 capsule (60 mg total) by mouth 2 (two) times daily. Nche, Charlene Brooke, NP  Active   empagliflozin (JARDIANCE) 25 MG TABS tablet [id number] Yes Take 1 tablet (25 mg total) by mouth daily. Shamleffer, Melanie Crazier, MD Taking Active   ferrous sulfate 325 (65 FE) MG tablet [id number] Yes Take 1 tablet (325 mg total) by mouth every other day.  Patient taking differently: Take 325 mg by mouth daily.   Elby Beck, FNP Taking Active Self  fluticasone (FLONASE) 50 MCG/ACT nasal spray [id number] Yes Place 2 sprays into both nostrils daily as needed for allergies. Elby Beck, FNP Taking Active Self  gabapentin (NEURONTIN) 300 MG capsule [id number] Yes Take 1 capsule (300 mg total) by mouth 3 (three) times daily.  Patient taking differently: Take 300 mg by mouth 2 (two) times daily.   Nche, Charlene Brooke, NP Taking Active   HYDROcodone-acetaminophen (NORCO/VICODIN) 5-325 MG tablet [id number] Yes Take 1 tablet by mouth every 12 (twelve) hours as needed for moderate pain. Nche, Charlene Brooke, NP Taking Active   insulin aspart (NOVOLOG FLEXPEN) 100 UNIT/ML FlexPen [id number] Yes Max daily 30 units  Patient taking differently: Inject 1-4 Units into the skin 3 (three) times daily as needed (blood sugar over 150). Max daily 30 units   Shamleffer, Melanie Crazier, MD Taking Active Self  Insulin Glargine (BASAGLAR KWIKPEN) 100 UNIT/ML [id number] Yes Inject 26 Units into the skin at bedtime. [provider] Taking Active Self  Insulin Pen  Needle 31G X 5 MM MISC [id number] Yes 1 Units by Does not apply route in the morning, at noon, in the evening, and at bedtime. Shamleffer, Melanie Crazier, MD Taking Active Self  metFORMIN (GLUCOPHAGE-XR) 500 MG 24 hr tablet [id number] Yes Take 1 tablet (500 mg total) by mouth in the morning and at bedtime. Shamleffer, Melanie Crazier, MD Taking Active Self  [facility] VERIO test strip [id number] Yes CHECK UP TO 4 TIMES A DAY AS DIRECTED Shamleffer, Melanie Crazier, MD Taking Active Self  rosuvastatin (CRESTOR) 20 MG tablet [id number]  TAKE 1 TABLET(20 MG) BY MOUTH DAILY Nche, Charlene Brooke, NP  Active   Semaglutide,0.25 or 0.5MG/DOS, (OZEMPIC, 0.25 OR 0.5 MG/DOSE,) 2 MG/3ML SOPN [id number] Yes Inject 0.5 mg into the skin once a week. 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   History of lumbar  fusion 02/28/2021   Osteoarthritis of glenohumeral joint (Right) 02/28/2021   Osteoarthritis of AC (acromioclavicular) joint (Right) 02/28/2021   Lumbosacral facet arthropathy (Multilevel) 02/28/2021   History of lumbar laminectomy (L4-S1) 02/28/2021   Osteoarthritis of knee (Left) 02/28/2021   Severe obesity (BMI 35.0-39.9) with comorbidity ([place]) 02/28/2021   Marijuana use 02/28/2021   Chronic lower extremity pain (2ry area of Pain) (Bilateral) (L>R) 02/28/2021   Chronic hip pain (3ry area of Pain) (Bilateral) (L>R) 02/28/2021   Chronic groin pain (Left)  02/28/2021   Impaired range of motion of hip 02/28/2021   PAD (peripheral artery disease) ([place]) 02/12/2021   Peripheral arterial disease ([place]) 02/06/2021   Decreased dorsalis pedis pulse 01/19/2021   Diabetes mellitus ([place]) 01/19/2021   Bipolar disorder ([place]) 01/17/2021   Pseudoarthrosis of lumbar spine 12/01/2020   Chronic low back pain (1ry area of Pain) (Bilateral) (L>R) w/o sciatica 09/06/2020   Pain in thoracic spine 08/03/2020   Ulnar 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 of left upper extremity 08/09/2019   Pincer nail deformity 05/19/2019   Excessive daytime sleepiness 03/15/2019   Type 2 diabetes mellitus with hyperglycemia, with long-term current use of insulin ([place]) 03/08/2019   Type 2 diabetes mellitus with diabetic polyneuropathy, with long-term current use of insulin ([place]) 03/08/2019   Diabetic neuropathy with neurologic complication ([place]) 27/06/2374   Claudication of both lower extremities ([place]) 02/04/2019   RLS (restless legs syndrome) 02/04/2019   Sleep deprivation 02/04/2019   Hot flashes 02/04/2019   Neck pain 02/04/2019   Chronic migraine without aura, with intractable migraine, so stated, with status migrainosus 01/27/2019   Chronic knee pain (4th area of Pain) (Bilateral) (L>R) 06/18/2018   Radiculopathy 02/11/2018   Diabetic ketoacidosis ([place]) 03/24/2016   Dermatitis 03/24/2016   HTN (hypertension) 03/24/2016   HLD (hyperlipidemia) 03/24/2016   DM (diabetes mellitus) ([place]) 03/24/2016   Chronic daily headache 11/23/2012   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    Immunization History  Administered Date(s) Administered   Moderna Sars-Covid-2 Vaccination 07/20/2019, 08/20/2019   Pneumococcal Polysaccharide-23 08/25/2017   Zoster Recombinat (Shingrix) 01/19/2021, 03/19/2021    Conditions to be addressed/monitored:  Hypertension, Hyperlipidemia, Diabetes, and Bipolar Disorder, Chronic Pain  Care Plan : General Pharmacy (Adult)  Updates made by  Germaine Pomfret, RPH since 07/02/2021 12:00 AM     Problem: Hypertension, Hyperlipidemia, Diabetes, and Bipolar Disorder, Chronic Pain   Priority: High     Long-Range Goal: Patient-Specific Goal   Start Date: 05/03/2021  Expected End Date: 05/04/2022  This Visit's Progress: On track  Recent Progress: On track  Priority: High  Note:   Current Barriers:  Unable to achieve control of diabetes   Pharmacist Clinical Goal(s):  Patient will achieve control of diabetes as evidenced by A1c less than 7% through collaboration with PharmD and provider.   Interventions: 1:1 collaboration with Nche, Charlene Brooke, NP regarding development and update of comprehensive plan of care as evidenced by provider attestation and co-signature Inter-disciplinary care team collaboration (see longitudinal plan of care) Comprehensive medication review performed; medication list updated in electronic medical record  Hyperlipidemia: (LDL goal < 70) -Uncontrolled -left common femoral endarterectomy Jan 2023 -Current treatment: Rosuvastatin 20 mg daily: Appropriate, Query effective -Current treatment: Aspirin 81 mg daily: Appropriate, Effective, Safe, Accessible  -Medications previously tried: NA  -Could consider increasing rosuvastatin to 40 mg daily if LDL remains uncontrolled.   Diabetes (A1c goal <7%) -Uncontrolled -Managed by Dr. Kelton Pillar; next follow-up in May.  -Current medications: Novolog 1-4 units three times daily  with meals: Appropriate, Query effective  Basaglar 26 units nightly: Appropriate, Query effective  Jardiance 25 mg daily: Appropriate, Query effective  Metformin XR 500 mg twice daily: Appropriate, Query effective  Ozempic 0.5 mg weekly  -Medications previously tried: Glipizide, Jardiance   -Diarrhea with higher doses of metformin  -Frequent yeast infections with Jardiance, continues with the medication for now.  -Current home glucose readings: Utilizing Dexcom: 110s  Exrcise  patterns: Stairs, swimming  -Denies hypoglycemic/hyperglycemic symptoms -Could increase Ozempic to 1 mg weekly at next endocrinology follow-up, continue medications for now.   Bipolar Disorder (Goal: Maintain symptom remission) -Not ideally controlled -Current treatment: Bupropinon 75 mg twice daily: Appropriate, Effective, Safe, Accessible Duloxetine 60 mg nightly: Appropriate, Effective, Safe, Accessible  -Medications previously tried/failed: NA -Recommended to continue current medication  Chronic Pain (Goal: Minimize symptoms) -Controlled -Current treatment  Duloxetine 60 mg twice daily: Appropriate, Effective, Safe, Accessible Gabapentin 300 mg three times daily: Appropriate, Effective, Safe, Accessible -Medications previously tried: NA   -Patient waiting for refill of hydrocodone by PCP.  -Discussed risks of CNS depression, staying alert prior to driving.  -Recommended to continue current medication  Patient Goals/Self-Care Activities Patient will:  - check glucose 3-4 times daily, document, and provide at future appointments  Follow Up Plan: Telephone follow up appointment with care management team member scheduled for:  10/18/2021 at 9:15 AM       Medication Assistance: None required.  Patient affirms current coverage meets needs.  Compliance/Adherence/Medication fill history: Care Gaps: Tetanus Vaccine Shingrix Vaccine COVID-19 Vaccine Foot Exam  HTN: 134/95- 04/02/2021  Star-Rating Drugs: Rosuvastatin 20 mg last filled 03/15/2021 for 90 day supply at [facility]. Jardiance 25 mg last filled 04/07/2021 for 100 day supply at [facility]. Metformin 500 mg last filled 01/03/2021 for 900 day supply at [facility].  Patient's preferred pharmacy is:  [facility] [phone] - [place], [place] - [address] [place] [address] Phone: [phone] Fax: [phone]  [facility]  Specialty [facility] Sites - [place], [place] [address] [place] [address] Phone: [phone] Fax: [phone]  Uses pill box? Yes Pt endorses 100% compliance  We discussed: Current pharmacy is preferred with insurance plan and patient is satisfied with pharmacy services Patient decided to: Continue current medication management strategy  Care Plan and Follow Up Patient Decision:  Patient agrees to Care Plan and Follow-up.  Plan: Telephone follow up appointment with care management team member scheduled for:  10/18/2021 at [place], PharmD, Para [place], CPP Clinical Pharmacist Practitioner  [facility]  [phone]

## 2021-06-29 ENCOUNTER — Ambulatory Visit
Admission: RE | Admit: 2021-06-29 | Discharge: 2021-06-29 | Disposition: A | Discharge status: Home / Self Care | Payer: Medicare HMO | Source: Ambulatory Visit | Attending: Nurse Practitioner | Admitting: Nurse Practitioner

## 2021-06-29 DIAGNOSIS — Z1231 Encounter for screening mammogram for malignant neoplasm of breast: Secondary | ICD-10-CM | POA: Diagnosis not present

## 2021-07-02 NOTE — Patient Instructions (Signed)
Visit Information It was great speaking with you today!  Please let me know if you have any questions about our visit.   Goals Addressed             This Visit's Progress    Monitor and Manage My Blood Sugar-Diabetes Type 2   On track    Timeframe:  Long-Range Goal Priority:  High Start Date: 05/03/21                            Expected End Date: 05/04/22                      Follow Up within 30 days   - check blood sugar four times daily, before meals and at bedtime - check blood sugar before and after exercise - check blood sugar if I feel it is too high or too low - take the blood sugar log to all doctor visits    Why is this important?   Checking your blood sugar at home helps to keep it from getting very high or very low.  Writing the results in a diary or log helps the doctor know how to care for you.  Your blood sugar log should have the time, date and the results.  Also, write down the amount of insulin or other medicine that you take.  Other information, like what you ate, exercise done and how you were feeling, will also be helpful.     Notes:         Patient Care Plan: General Pharmacy (Adult)     Problem Identified: Hypertension, Hyperlipidemia, Diabetes, and Bipolar Disorder, Chronic Pain   Priority: High     Long-Range Goal: Patient-Specific Goal   Start Date: 05/03/2021  Expected End Date: 05/04/2022  This Visit's Progress: On track  Recent Progress: On track  Priority: High  Note:   Current Barriers:  Unable to achieve control of diabetes   Pharmacist Clinical Goal(s):  Patient will achieve control of diabetes as evidenced by A1c less than 7% through collaboration with PharmD and provider.   Interventions: 1:1 collaboration with Nche, Charlene Brooke, NP regarding development and update of comprehensive plan of care as evidenced by provider attestation and co-signature Inter-disciplinary care team collaboration (see longitudinal plan of  care) Comprehensive medication review performed; medication list updated in electronic medical record  Hyperlipidemia: (LDL goal < 70) -Uncontrolled -left common femoral endarterectomy Jan 2023 -Current treatment: Rosuvastatin 20 mg daily: Appropriate, Query effective -Current treatment: Aspirin 81 mg daily: Appropriate, Effective, Safe, Accessible  -Medications previously tried: NA  -Could consider increasing rosuvastatin to 40 mg daily if LDL remains uncontrolled.   Diabetes (A1c goal <7%) -Uncontrolled -Managed by Dr. Kelton Pillar; next follow-up in May.  -Current medications: Novolog 1-4 units three times daily with meals: Appropriate, Query effective  Basaglar 26 units nightly: Appropriate, Query effective  Jardiance 25 mg daily: Appropriate, Query effective  Metformin XR 500 mg twice daily: Appropriate, Query effective  Ozempic 0.5 mg weekly  -Medications previously tried: Glipizide, Jardiance   -Diarrhea with higher doses of metformin  -Frequent yeast infections with Jardiance, continues with the medication for now.  -Current home glucose readings: Utilizing Dexcom: 110s  Exrcise patterns: Stairs, swimming  -Denies hypoglycemic/hyperglycemic symptoms -Could increase Ozempic to 1 mg weekly at next endocrinology follow-up, continue medications for now.   Bipolar Disorder (Goal: Maintain symptom remission) -Not ideally controlled -Current treatment: Bupropinon 75 mg twice  daily: Appropriate, Effective, Safe, Accessible Duloxetine 60 mg nightly: Appropriate, Effective, Safe, Accessible  -Medications previously tried/failed: NA -Recommended to continue current medication  Chronic Pain (Goal: Minimize symptoms) -Controlled -Current treatment  Duloxetine 60 mg twice daily: Appropriate, Effective, Safe, Accessible Gabapentin 300 mg three times daily: Appropriate, Effective, Safe, Accessible -Medications previously tried: NA   -Patient waiting for refill of hydrocodone by PCP.   -Discussed risks of CNS depression, staying alert prior to driving.  -Recommended to continue current medication  Patient Goals/Self-Care Activities Patient will:  - check glucose 3-4 times daily, document, and provide at future appointments  Follow Up Plan: Telephone follow up appointment with care management team member scheduled for:  10/18/2021 at 9:15 AM      Patient agreed to services and verbal consent obtained.   Patient verbalizes understanding of instructions and care plan provided today and agrees to view in [place]. Active MyChart status and patient understanding of how to access instructions and care plan via MyChart confirmed with patient.     Junius Argyle, PharmD, [place], CPP Clinical Pharmacist Practitioner  [facility]  [phone]

## 2021-07-03 ENCOUNTER — Other Ambulatory Visit: Payer: Self-pay | Admitting: Surgery

## 2021-07-03 DIAGNOSIS — R590 Localized enlarged lymph nodes: Secondary | ICD-10-CM | POA: Diagnosis not present

## 2021-07-09 NOTE — Progress Notes (Signed)
PROVIDER NOTE: Information contained herein reflects review and annotations entered in association with encounter. Interpretation of such information and data should be left to medically-trained personnel. Information provided to patient can be located elsewhere in the medical record under "Patient Instructions". Document created using STT-dictation technology, any transcriptional errors that may result from process are unintentional.    Patient: Autumn Valenzuela  Service Category: E/M  Provider: Gaspar Cola, MD  DOB: [birth year]  DOS: 07/12/2021  Specialty: Interventional Pain Management  MRN: [id number]  Setting: Ambulatory outpatient  PCP: Flossie Buffy, NP  Type: Established Patient    Referring Provider: Flossie Buffy, NP  Location: Office  Delivery: Face-to-face     HPI  [name], a 61 y.o. year old female, is here today because of her Chronic bilateral low back pain without sciatica [M54.50, G89.29]. Autumn Valenzuela primary complain today is Back Pain (Left side) Last encounter: My last encounter with her was on 06/26/2021. Pertinent problems: Autumn Valenzuela has Chronic daily headache; Radiculopathy; Chronic knee pain (4th area of Pain) (Bilateral) (L>R); Chronic migraine without aura, with intractable migraine, so stated, with status migrainosus; Diabetic neuropathy with neurologic complication ([place]); Claudication of both lower extremities ([place]); RLS (restless legs syndrome); Neck pain; Ulnar 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 of left upper extremity; Pain in thoracic spine; Chronic low back pain (1ry area of Pain) (Bilateral) (L>R) w/o sciatica; Pseudoarthrosis of lumbar spine; Chronic pain syndrome; Failed back surgical syndrome; History of lumbar fusion; Osteoarthritis of glenohumeral joint (Right); Osteoarthritis of AC (acromioclavicular) joint (Right); Lumbosacral facet arthropathy (Multilevel); History of lumbar laminectomy (L4-S1); Osteoarthritis of knee (Left); Chronic lower extremity  pain (2ry area of Pain) (Bilateral) (L>R); Chronic hip pain (3ry area of Pain) (Bilateral) (L>R); Chronic groin pain (Left); Impaired range of motion of hip; Abnormal CT scan, lumbar spine (10/11/2020); Lumbar facet syndrome; Spondylosis without myelopathy or radiculopathy, lumbosacral region; and DDD (degenerative disc disease), lumbosacral on their pertinent problem list. Pain Assessment: Severity of Chronic pain is reported as a 9 /10. Location: Back Left/pain radiaties down her left side to her buttock , hip. knee and to her ankle. Onset: More than a month ago. Quality: Stabbing, Shooting, Aching, Constant, Discomfort. Timing: Constant. Modifying factor(s): warm bath, heat and ice pack,. Vitals:  height is '5\' 1"'  (1.549 m) and weight is 198 lb (89.8 kg). Her temperature is 97.2 F (36.2 C) (abnormal). Her blood pressure is 105/56 (abnormal) and her pulse is 85. Her oxygen saturation is 98%.   Reason for encounter: post-procedure evaluation and assessment.  Because of the patient's diabetes, we did not put any steroids on the diagnostic injection.  The patient has attained 100% relief of the pain for the duration of the local anesthetic that in fact lasted past the average duration and she indicated having had 3 days of 100% relief.  At this point, we will be repeating the diagnostic injection and if she gets similar results, she would be an excellent candidate for radiofrequency ablation.  The patient indicates that she has done physical therapy with no significant benefit.  Post-procedure evaluation   Type: Lumbar Facet, Medial Branch Block(s) #1 (NO STEROIDS) Laterality: Bilateral  Level: L2, L3, L4, L5, & S1 Medial Branch Level(s). Injecting these levels blocks the L3-4 and L5-S1 lumbar facet joints. Imaging: Fluoroscopic guidance Anesthesia: Local anesthesia (1-2% Lidocaine) Anxiolysis: IV Versed 2.0 mg Sedation: Moderate conscious sedation. DOS: 06/26/2021 Performed by: Gaspar Cola,  MD  Primary Purpose: Diagnostic/Therapeutic Indications: Low back pain severe enough  to impact quality of life or function. 1. Lumbar facet syndrome   2. Spondylosis without myelopathy or radiculopathy, lumbosacral region   3. Lumbosacral facet arthropathy (Multilevel)   4. Chronic low back pain (1ry area of Pain) (Bilateral) (L>R) w/o sciatica   5. DDD (degenerative disc disease), lumbosacral    NAS-11 Pain score:   Pre-procedure: 5 /10   Post-procedure: 0-No pain/10   Since the patient has a known history of insulin-dependent diabetes mellitus with known episodes of ketoacidosis, today we have decided to do the diagnostic lumbar facet block without any steroids to avoid the possibility of complications secondary to hyperglycemia.    Effectiveness:  Initial hour after procedure: 100 %. Subsequent 4-6 hours post-procedure: 100 %. Analgesia past initial 6 hours: 50 % (lasted for 3 days and pain came back little worse than before). Ongoing improvement:  Analgesic: The patient indicates having attained 100% relief of the pain for the duration of the local anesthetic which upon further questioning she indicated that lasted for approximately 3 days, after that the pain started coming back and she only had about 50% benefit that quickly wore off. Function: Back to baseline.  The patient indicates having improved function while the pain was gone. ROM: Back to baseline.  The patient indicated having improved range of motion while the pain was gone.  Pharmacotherapy Assessment  Analgesic: Hydrocodone/APAP 5/325 1 tab p.o. twice daily (last filled on 05/07/2021) (prescribed by Leana Gamer, ANP) MME/day: 10 mg/day   Monitoring: [place] PMP: PDMP reviewed during this encounter.       Pharmacotherapy: No side-effects or adverse reactions reported. Compliance: No problems identified. Effectiveness: Clinically acceptable.  No notes on file  UDS:  No results found for: "SUMMARY"   ROS   Constitutional: Denies any fever or chills Gastrointestinal: No reported hemesis, hematochezia, vomiting, or acute GI distress Musculoskeletal: Denies any acute onset joint swelling, redness, loss of ROM, or weakness Neurological: No reported episodes of acute onset apraxia, aphasia, dysarthria, agnosia, amnesia, paralysis, loss of coordination, or loss of consciousness  Medication Review  Accu-Chek FastClix Lancets, Accu-Chek Guide Control, Basaglar KwikPen, DULoxetine, Dexcom G6 Sensor, Dexcom G6 Transmitter, Insulin Pen Needle, Semaglutide(0.25 or 0.5MG/DOS), Vitamin D3, albuterol, aspirin EC, blood glucose meter kit and supplies, buPROPion, diclofenac Sodium, empagliflozin, ferrous sulfate, fluticasone, gabapentin, glucose blood, insulin aspart, metFORMIN, and rosuvastatin  History Review  Allergy: Autumn Valenzuela has No Known Allergies. Drug: Autumn Valenzuela  reports that she does not currently use drugs after having used the following drugs: Marijuana. Alcohol:  reports that she does not currently use alcohol. Tobacco:  reports that she quit smoking about 5 years ago. Her smoking use included cigarettes. She has never used smokeless tobacco. Social: Autumn Valenzuela  reports that she quit smoking about 5 years ago. Her smoking use included cigarettes. She has never used smokeless tobacco. She reports that she does not currently use alcohol. She reports that she does not currently use drugs after having used the following drugs: Marijuana. Medical:  has a past medical history of Allergy, Anemia, Anxiety, Arthritis, Asthma, Back pain, Bipolar disorder ([place]), CVA (cerebral infarction), Depression, Diabetes mellitus without complication ([place]), High cholesterol, Hypertension, Left leg pain, Migraine, Sleep apnea, and Stroke ([place]) (2007). Surgical: Autumn Valenzuela  has a past surgical history that includes Back surgery; Partial hysterectomy; Shoulder surgery; Knee surgery; RADICULOPATHY (Left); NEUROFORAMINAL STENOSIS  (Left); Eye surgery (Bilateral); DIABETES; Anterior lateral lumbar fusion with percutaneous screw 1 level (Left, 02/11/2018); l 4-l5 status post lateral posterior fusion  January 30,2020; Abdominal hysterectomy; ABDOMINAL AORTOGRAM W/LOWER EXTREMITY (N/A, 02/08/2021); and Endarterectomy femoral (Left, 02/12/2021). Family: family history includes Cancer in her paternal aunt and paternal aunt; Diabetes in her brother and mother; Stroke in her father.  Laboratory Chemistry Profile   Renal Lab Results  Component Value Date   BUN 8 02/13/2021   CREATININE 0.65 02/13/2021   GFR 95.70 01/17/2021   GFRAA >60 02/09/2018   GFRNONAA >60 02/13/2021    Hepatic Lab Results  Component Value Date   AST 10 (L) 02/12/2021   ALT 10 02/12/2021   ALBUMIN 3.4 (L) 02/12/2021   ALKPHOS 57 02/12/2021   LIPASE 58 11/11/2013    Electrolytes Lab Results  Component Value Date   NA 139 02/13/2021   K 3.7 02/13/2021   CL 107 02/13/2021   CALCIUM 8.6 (L) 02/13/2021   MG 1.9 10/07/2018    Bone Lab Results  Component Value Date   VD25OH 31.23 01/17/2021    Inflammation (CRP: Acute Phase) (ESR: Chronic Phase) Lab Results  Component Value Date   CRP 14.6 (H) 07/21/2020   ESRSEDRATE 34 (H) 07/21/2020         Note: Above Lab results reviewed.  Recent Imaging Review  MM 3D SCREEN BREAST BILATERAL CLINICAL DATA:  Screening.  EXAM: DIGITAL SCREENING BILATERAL MAMMOGRAM WITH TOMOSYNTHESIS AND CAD  TECHNIQUE: Bilateral screening digital craniocaudal and mediolateral oblique mammograms were obtained. Bilateral screening digital breast tomosynthesis was performed. The images were evaluated with computer-aided detection.  COMPARISON:  Previous exam(s).  ACR Breast Density Category b: There are scattered areas of fibroglandular density.  FINDINGS: There are no findings suspicious for malignancy.  IMPRESSION: No mammographic evidence of malignancy. A result letter of this screening mammogram will be  mailed directly to the patient.  RECOMMENDATION: Screening mammogram in one year. (Code:SM-B-01Y)  BI-RADS CATEGORY  1: Negative.  Electronically Signed   By: Dorise Bullion III M.D.   On: 07/02/2021 17:44 Note: Reviewed        Physical Exam  General appearance: Well nourished, well developed, and well hydrated. In no apparent acute distress Mental status: Alert, oriented x 3 (person, place, & time)       Respiratory: No evidence of acute respiratory distress Eyes: PERLA Vitals: BP (!) 105/56   Pulse 85   Temp (!) 97.2 F (36.2 C)   Ht '5\' 1"'  (1.549 m)   Wt 198 lb (89.8 kg)   SpO2 98%   BMI 37.41 kg/m  BMI: Estimated body mass index is 37.41 kg/m as calculated from the following:   Height as of this encounter: '5\' 1"'  (1.549 m).   Weight as of this encounter: 198 lb (89.8 kg). Ideal: Ideal body weight: 47.8 kg (105 lb 6.1 oz) Adjusted ideal body weight: 64.6 kg (142 lb 6.8 oz)  Assessment   Diagnosis Status  1. Chronic low back pain (1ry area of Pain) (Bilateral) (L>R) w/o sciatica   2. Lumbar facet syndrome   3. Chronic lower extremity pain (2ry area of Pain) (Bilateral) (L>R)   4. Chronic hip pain (3ry area of Pain) (Bilateral) (L>R)   5. Chronic knee pain (4th area of Pain) (Bilateral) (L>R)   6. DDD (degenerative disc disease), lumbosacral   7. Spondylosis without myelopathy or radiculopathy, lumbosacral region   8. Lumbosacral facet arthropathy (Multilevel)    Controlled Controlled Controlled   Updated Problems: No problems updated.  Plan of Care  Problem-specific:  No problem-specific Assessment & Plan notes found for this encounter.  Autumn Valenzuela has a current medication list which includes the following long-term medication(s): albuterol, albuterol, bupropion, duloxetine, ferrous sulfate, fluticasone, gabapentin, novolog flexpen, basaglar kwikpen, metformin, onetouch verio, and rosuvastatin.  Pharmacotherapy (Medications Ordered): No orders of the  defined types were placed in this encounter.  Orders:  Orders Placed This Encounter  Procedures   LUMBAR FACET(MEDIAL BRANCH NERVE BLOCK) MBNB    Standing Status:   Future    Standing Expiration Date:   10/12/2021    Scheduling Instructions:     Procedure: Lumbar facet block (AKA.: Lumbosacral medial branch nerve block) (NO STEROIDS)     Side: Bilateral     Level: L3-4 & L5-S1 Facets (L2, L3, L4, L5, & S1 Medial Branch Nerves)     Sedation: Patient's choice.     Timeframe: ASAA    Order Specific Question:   Where will this procedure be performed?    Answer:   [facility]   Follow-up plan:   Return for (ECT) procedure: (B) L-FCT Blk #2 (NO STEROIDS).     Interventional Therapies  Risk  Complexity Considerations:   Estimated body mass index is 38.55 kg/m as calculated from the following:   Height as of this encounter: '5\' 1"'  (1.549 m).   Weight as of this encounter: 204 lb (92.5 kg). IDDMM w/ complications & Hx of ketoacidosis PVD   Planned  Pending:   Diagnostic bilateral lumbar facet MBB #2 (without steroids)    Under consideration:   Diagnostic bilateral lumbar facet MBB #2 (without steroids)  Diagnostic bilateral IA hip joint inj. #1  Diagnostic right IA knee joint inj. #1  Diagnostic left genicular NB #1    Completed:   Diagnostic bilateral lumbar facet MBB x1 (without steroids) (06/26/2021) (100/100/50)    Therapeutic  Palliative (PRN) options:   None established      Recent Visits Date Type Provider Dept  06/26/21 Procedure visit Milinda Pointer, MD [facility]  05/14/21 Office Visit Milinda Pointer, MD [facility]  Showing recent visits within past 90 days and meeting all other requirements Today's Visits Date Type Provider Dept  07/12/21 Office Visit Milinda Pointer, MD [facility]  Showing today's visits and meeting all other requirements Future Appointments No visits were found meeting these  conditions. Showing future appointments within next 90 days and meeting all other requirements  I discussed the assessment and treatment plan with the patient. The patient was provided an opportunity to ask questions and all were answered. The patient agreed with the plan and demonstrated an understanding of the instructions.  Patient advised to call back or seek an in-person evaluation if the symptoms or condition worsens.  Duration of encounter: 30 minutes.  Total time on encounter, as per AMA guidelines included both the face-to-face and non-face-to-face time personally spent by the physician and/or other qualified health care professional(s) on the day of the encounter (includes time in activities that require the physician or other qualified health care professional and does not include time in activities normally performed by clinical staff). Physician's time may include the following activities when performed: preparing to see the patient (eg, review of tests, pre-charting review of records) obtaining and/or reviewing separately obtained history performing a medically appropriate examination and/or evaluation counseling and educating the patient/family/caregiver ordering medications, tests, or procedures referring and communicating with other health care professionals (when not separately reported) documenting clinical information in the electronic or other health record independently interpreting results (not separately reported) and communicating results to the  patient/ family/caregiver care coordination (not separately reported)  Note by: Gaspar Cola, MD Date: 07/12/2021; Time: 12:20 PM

## 2021-07-10 ENCOUNTER — Encounter (HOSPITAL_BASED_OUTPATIENT_CLINIC_OR_DEPARTMENT_OTHER): Payer: Self-pay | Admitting: Surgery

## 2021-07-10 ENCOUNTER — Other Ambulatory Visit: Payer: Self-pay

## 2021-07-12 ENCOUNTER — Encounter: Payer: Self-pay | Admitting: Pain Medicine

## 2021-07-12 ENCOUNTER — Ambulatory Visit: Payer: Medicare HMO | Attending: Pain Medicine | Admitting: Pain Medicine

## 2021-07-12 VITALS — BP 105/56 | HR 85 | Temp 97.2°F | Ht 61.0 in | Wt 198.0 lb

## 2021-07-12 DIAGNOSIS — M25552 Pain in left hip: Secondary | ICD-10-CM | POA: Diagnosis not present

## 2021-07-12 DIAGNOSIS — M47817 Spondylosis without myelopathy or radiculopathy, lumbosacral region: Secondary | ICD-10-CM | POA: Diagnosis not present

## 2021-07-12 DIAGNOSIS — M79604 Pain in right leg: Secondary | ICD-10-CM | POA: Insufficient documentation

## 2021-07-12 DIAGNOSIS — M545 Low back pain, unspecified: Secondary | ICD-10-CM | POA: Diagnosis not present

## 2021-07-12 DIAGNOSIS — M25561 Pain in right knee: Secondary | ICD-10-CM | POA: Insufficient documentation

## 2021-07-12 DIAGNOSIS — M5137 Other intervertebral disc degeneration, lumbosacral region: Secondary | ICD-10-CM | POA: Diagnosis not present

## 2021-07-12 DIAGNOSIS — M25562 Pain in left knee: Secondary | ICD-10-CM | POA: Diagnosis not present

## 2021-07-12 DIAGNOSIS — M25551 Pain in right hip: Secondary | ICD-10-CM | POA: Diagnosis not present

## 2021-07-12 DIAGNOSIS — M79605 Pain in left leg: Secondary | ICD-10-CM | POA: Insufficient documentation

## 2021-07-12 DIAGNOSIS — G8929 Other chronic pain: Secondary | ICD-10-CM | POA: Insufficient documentation

## 2021-07-12 DIAGNOSIS — M47816 Spondylosis without myelopathy or radiculopathy, lumbar region: Secondary | ICD-10-CM | POA: Diagnosis not present

## 2021-07-12 NOTE — Patient Instructions (Signed)
______________________________________________________________________  Preparing for Procedure with Sedation  NOTICE: Due to recent regulatory changes, starting on August 14, 2020, procedures requiring intravenous (IV) sedation will no longer be performed at the [facility].  These types of procedures are required to be performed at [facility] ambulatory surgery facility.  We are very sorry for the inconvenience.  Procedure appointments are limited to planned procedures: No Prescription Refills. No disability issues will be discussed. No medication changes will be discussed.  Instructions: Oral Intake: Do not eat or drink anything for at least 8 hours prior to your procedure. (Exception: Blood Pressure Medication. See below.) Transportation: A driver is required. You may not drive yourself after the procedure. Blood Pressure Medicine: Do not forget to take your blood pressure medicine with a sip of water the morning of the procedure. If your Diastolic (lower reading) is above 100 mmHg, elective cases will be cancelled/rescheduled. Blood thinners: These will need to be stopped for procedures. Notify our staff if you are taking any blood thinners. Depending on which one you take, there will be specific instructions on how and when to stop it. Diabetics on insulin: Notify the staff so that you can be scheduled 1st case in the morning. If your diabetes requires high dose insulin, take only  of your normal insulin dose the morning of the procedure and notify the staff that you have done so. Preventing infections: Shower with an antibacterial soap the morning of your procedure. Build-up your immune system: Take 1000 mg of Vitamin C with every meal (3 times a day) the day prior to your procedure. Antibiotics: Inform the staff if you have a condition or reason that requires you to take antibiotics before dental procedures. Pregnancy: If you are pregnant, call and cancel the procedure. Sickness: If  you have a cold, fever, or any active infections, call and cancel the procedure. Arrival: You must be in the facility at least 30 minutes prior to your scheduled procedure. Children: Do not bring children with you. Dress appropriately: There is always the possibility that your clothing may get soiled. Valuables: Do not bring any jewelry or valuables.  Reasons to call and reschedule or cancel your procedure: (Following these recommendations will minimize the risk of a serious complication.) Surgeries: Avoid having procedures within 2 weeks of any surgery. (Avoid for 2 weeks before or after any surgery). Flu Shots: Avoid having procedures within 2 weeks of a flu shots. (Avoid for 2 weeks before or after immunizations). Barium: Avoid having a procedure within 7-10 days after having had a radiological study involving the use of radiological contrast. (Myelograms, Barium swallow or enema study). Heart attacks: Avoid any elective procedures or surgeries for the initial 6 months after a "Myocardial Infarction" (Heart Attack). Blood thinners: It is imperative that you stop these medications before procedures. Let us know if you if you take any blood thinner.  Infection: Avoid procedures during or within two weeks of an infection (including chest colds or gastrointestinal problems). Symptoms associated with infections include: Localized redness, fever, chills, night sweats or profuse sweating, burning sensation when voiding, cough, congestion, stuffiness, runny nose, sore throat, diarrhea, nausea, vomiting, cold or Flu symptoms, recent or current infections. It is specially important if the infection is over the area that we intend to treat. Heart and lung problems: Symptoms that may suggest an active cardiopulmonary problem include: cough, chest pain, breathing difficulties or shortness of breath, dizziness, ankle swelling, uncontrolled high or unusually low blood pressure, and/or palpitations. If you are    experiencing any of these symptoms, cancel your procedure and contact your primary care physician for an evaluation.  Remember:  Regular Business hours are:  Monday to Thursday 8:00 AM to 4:00 PM  Provider's Schedule: Weston Kallman, MD:  Procedure days: Tuesday and Thursday 7:30 AM to 4:00 PM  Bilal Lateef, MD:  Procedure days: Monday and Wednesday 7:30 AM to 4:00 PM ______________________________________________________________________  ____________________________________________________________________________________________  General Risks and Possible Complications  Patient Responsibilities: It is important that you read this as it is part of your informed consent. It is our duty to inform you of the risks and possible complications associated with treatments offered to you. It is your responsibility as a patient to read this and to ask questions about anything that is not clear or that you believe was not covered in this document.  Patient's Rights: You have the right to refuse treatment. You also have the right to change your mind, even after initially having agreed to have the treatment done. However, under this last option, if you wait until the last second to change your mind, you may be charged for the materials used up to that point.  Introduction: Medicine is not an exact science. Everything in Medicine, including the lack of treatment(s), carries the potential for danger, harm, or loss (which is by definition: Risk). In Medicine, a complication is a secondary problem, condition, or disease that can aggravate an already existing one. All treatments carry the risk of possible complications. The fact that a side effects or complications occurs, does not imply that the treatment was conducted incorrectly. It must be clearly understood that these can happen even when everything is done following the highest safety standards.  No treatment: You can choose not to proceed with the  proposed treatment alternative. The "PRO(s)" would include: avoiding the risk of complications associated with the therapy. The "CON(s)" would include: not getting any of the treatment benefits. These benefits fall under one of three categories: diagnostic; therapeutic; and/or palliative. Diagnostic benefits include: getting information which can ultimately lead to improvement of the disease or symptom(s). Therapeutic benefits are those associated with the successful treatment of the disease. Finally, palliative benefits are those related to the decrease of the primary symptoms, without necessarily curing the condition (example: decreasing the pain from a flare-up of a chronic condition, such as incurable terminal cancer).  General Risks and Complications: These are associated to most interventional treatments. They can occur alone, or in combination. They fall under one of the following six (6) categories: no benefit or worsening of symptoms; bleeding; infection; nerve damage; allergic reactions; and/or death. No benefits or worsening of symptoms: In Medicine there are no guarantees, only probabilities. No healthcare provider can ever guarantee that a medical treatment will work, they can only state the probability that it may. Furthermore, there is always the possibility that the condition may worsen, either directly, or indirectly, as a consequence of the treatment. Bleeding: This is more common if the patient is taking a blood thinner, either prescription or over the counter (example: Goody Powders, Fish oil, Aspirin, Garlic, etc.), or if suffering a condition associated with impaired coagulation (example: Hemophilia, cirrhosis of the liver, low platelet counts, etc.). However, even if you do not have one on these, it can still happen. If you have any of these conditions, or take one of these drugs, make sure to notify your treating physician. Infection: This is more common in patients with a compromised  immune system, either due to disease (example:   diabetes, cancer, human immunodeficiency virus [HIV], etc.), or due to medications or treatments (example: therapies used to treat cancer and rheumatological diseases). However, even if you do not have one on these, it can still happen. If you have any of these conditions, or take one of these drugs, make sure to notify your treating physician. Nerve Damage: This is more common when the treatment is an invasive one, but it can also happen with the use of medications, such as those used in the treatment of cancer. The damage can occur to small secondary nerves, or to large primary ones, such as those in the spinal cord and brain. This damage may be temporary or permanent and it may lead to impairments that can range from temporary numbness to permanent paralysis and/or brain death. Allergic Reactions: Any time a substance or material comes in contact with our body, there is the possibility of an allergic reaction. These can range from a mild skin rash (contact dermatitis) to a severe systemic reaction (anaphylactic reaction), which can result in death. Death: In general, any medical intervention can result in death, most of the time due to an unforeseen complication. ____________________________________________________________________________________________  

## 2021-07-13 DIAGNOSIS — E785 Hyperlipidemia, unspecified: Secondary | ICD-10-CM

## 2021-07-13 DIAGNOSIS — F319 Bipolar disorder, unspecified: Secondary | ICD-10-CM

## 2021-07-13 DIAGNOSIS — Z794 Long term (current) use of insulin: Secondary | ICD-10-CM | POA: Diagnosis not present

## 2021-07-13 DIAGNOSIS — E1169 Type 2 diabetes mellitus with other specified complication: Secondary | ICD-10-CM

## 2021-07-13 DIAGNOSIS — R69 Illness, unspecified: Secondary | ICD-10-CM | POA: Diagnosis not present

## 2021-07-20 DIAGNOSIS — E1142 Type 2 diabetes mellitus with diabetic polyneuropathy: Secondary | ICD-10-CM | POA: Diagnosis not present

## 2021-07-20 DIAGNOSIS — E1165 Type 2 diabetes mellitus with hyperglycemia: Secondary | ICD-10-CM | POA: Diagnosis not present

## 2021-07-22 NOTE — Progress Notes (Signed)
PROVIDER NOTE: Interpretation of information contained herein should be left to medically-trained personnel. Specific patient instructions are provided elsewhere under "Patient Instructions" section of medical record. This document was created in part using STT-dictation technology, any transcriptional errors that may result from this process are unintentional.  Patient: Autumn Valenzuela Type: Established DOB: September 12, 1960 MRN: [id number] PCP: Flossie Buffy, NP  Service: Procedure DOS: 07/24/2021 Setting: Ambulatory Location: Ambulatory outpatient facility Delivery: Face-to-face Provider: Gaspar Cola, MD Specialty: Interventional Pain Management Specialty designation: 09 Location: Outpatient facility Ref. Prov.: Milinda Pointer, MD    Primary Reason for Visit: Interventional Pain Management Treatment. CC: No chief complaint on file.   Procedure:           Type: Lumbar Facet, Medial Branch Block(s)         (NO STEROIDS) Laterality: Bilateral  Level: L2, L3, L4, L5, & S1 Medial Branch Level(s). Injecting these levels blocks the L3-4, L4-5 and L5-S1 lumbar facet joints. Imaging: Fluoroscopic guidance Anesthesia: Local anesthesia (1-2% Lidocaine) Anxiolysis: IV Versed         Sedation: None. DOS: 07/24/2021 Performed by: Gaspar Cola, MD  Primary Purpose: Diagnostic/Therapeutic Indications: Low back pain severe enough to impact quality of life or function. No diagnosis found. NAS-11 Pain score:   Pre-procedure:  /10   Post-procedure:  /10     Position / Prep / Materials:  Position: Prone  Prep solution: DuraPrep (Iodine Povacrylex [0.7% available iodine] and Isopropyl Alcohol, 74% w/w) Area Prepped: Posterolateral Lumbosacral Spine (Wide prep: From the lower border of the scapula down to the end of the tailbone and from flank to flank.)  Materials:  Tray: Block Needle(s):  Type: Spinal  Gauge (G): 22  Length: 5-in Qty: 4  Pre-op H&P Assessment:  Ms.  Autumn Valenzuela is a 61 y.o. (year old), female patient, seen today for interventional treatment. She  has a past surgical history that includes Back surgery; Partial hysterectomy; Shoulder surgery; Knee surgery; RADICULOPATHY (Left); NEUROFORAMINAL STENOSIS (Left); Eye surgery (Bilateral); DIABETES; Anterior lateral lumbar fusion with percutaneous screw 1 level (Left, 02/11/2018); l 4-l5 status post lateral posterior fusion January 30,2020; Abdominal hysterectomy; ABDOMINAL AORTOGRAM W/LOWER EXTREMITY (N/A, 02/08/2021); and Endarterectomy femoral (Left, 02/12/2021). [name] has a current medication list which includes the following prescription(s): accu-chek fastclix lancets, albuterol, albuterol, aspirin ec, accu-chek guide control, blood glucose meter kit and supplies, bupropion, vitamin d3, dexcom g6 sensor, dexcom g6 transmitter, diclofenac sodium, duloxetine, empagliflozin, ferrous sulfate, fluticasone, gabapentin, novolog flexpen, basaglar kwikpen, insulin pen needle, metformin, onetouch verio, rosuvastatin, and ozempic (0.25 or 0.5 mg/dose). Her primarily concern today is the No chief complaint on file.  Initial Vital Signs:  Pulse/HCG Rate:    Temp:   Resp:   BP:   SpO2:    BMI: Estimated body mass index is 37.41 kg/m as calculated from the following:   Height as of 07/12/21: '5\' 1"'  (1.549 m).   Weight as of 07/12/21: 198 lb (89.8 kg).  Risk Assessment: Allergies: Reviewed. She has No Known Allergies.  Allergy Precautions: None required Coagulopathies: Reviewed. None identified.  Blood-thinner therapy: None at this time Active Infection(s): Reviewed. None identified. [name] is afebrile  Site Confirmation: [name] was asked to confirm the procedure and laterality before marking the site Procedure checklist: Completed Consent: Before the procedure and under the influence of no sedative(s), amnesic(s), or anxiolytics, the patient was informed of the treatment options, risks and possible  complications. To fulfill our ethical and legal obligations, as recommended by the  American Medical Association's Code of Ethics, I have informed the patient of my clinical impression; the nature and purpose of the treatment or procedure; the risks, benefits, and possible complications of the intervention; the alternatives, including doing nothing; the risk(s) and benefit(s) of the alternative treatment(s) or procedure(s); and the risk(s) and benefit(s) of doing nothing. The patient was provided information about the general risks and possible complications associated with the procedure. These may include, but are not limited to: failure to achieve desired goals, infection, bleeding, organ or nerve damage, allergic reactions, paralysis, and death. In addition, the patient was informed of those risks and complications associated to Spine-related procedures, such as failure to decrease pain; infection (i.e.: Meningitis, epidural or intraspinal abscess); bleeding (i.e.: epidural hematoma, subarachnoid hemorrhage, or any other type of intraspinal or peri-dural bleeding); organ or nerve damage (i.e.: Any type of peripheral nerve, nerve root, or spinal cord injury) with subsequent damage to sensory, motor, and/or autonomic systems, resulting in permanent pain, numbness, and/or weakness of one or several areas of the body; allergic reactions; (i.e.: anaphylactic reaction); and/or death. Furthermore, the patient was informed of those risks and complications associated with the medications. These include, but are not limited to: allergic reactions (i.e.: anaphylactic or anaphylactoid reaction(s)); adrenal axis suppression; blood sugar elevation that in diabetics may result in ketoacidosis or comma; water retention that in patients with history of congestive heart failure may result in shortness of breath, pulmonary edema, and decompensation with resultant heart failure; weight gain; swelling or edema; medication-induced  neural toxicity; particulate matter embolism and blood vessel occlusion with resultant organ, and/or nervous system infarction; and/or aseptic necrosis of one or more joints. Finally, the patient was informed that Medicine is not an exact science; therefore, there is also the possibility of unforeseen or unpredictable risks and/or possible complications that may result in a catastrophic outcome. The patient indicated having understood very clearly. We have given the patient no guarantees and we have made no promises. Enough time was given to the patient to ask questions, all of which were answered to the patient's satisfaction. Ms. Gwynne has indicated that she wanted to continue with the procedure. Attestation: I, the ordering provider, attest that I have discussed with the patient the benefits, risks, side-effects, alternatives, likelihood of achieving goals, and potential problems during recovery for the procedure that I have provided informed consent. Date  Time: {CHL [facility]-PAIN TIME CHOICES:21018001}  Pre-Procedure Preparation:  Monitoring: As per clinic protocol. Respiration, ETCO2, SpO2, BP, heart rate and rhythm monitor placed and checked for adequate function Safety Precautions: Patient was assessed for positional comfort and pressure points before starting the procedure. Time-out: I initiated and conducted the "Time-out" before starting the procedure, as per protocol. The patient was asked to participate by confirming the accuracy of the "Time Out" information. Verification of the correct person, site, and procedure were performed and confirmed by me, the nursing staff, and the patient. "Time-out" conducted as per Joint Commission's Universal Protocol (UP.01.01.01). Time:    Description of Procedure:          Laterality: Bilateral. The procedure was performed in identical fashion on both sides. Targeted Levels:  L2, L3, L4, L5, & S1 Medial Branch Level(s)  Safety Precautions: Aspiration  looking for blood return was conducted prior to all injections. At no point did we inject any substances, as a needle was being advanced. Before injecting, the patient was told to immediately notify me if she was experiencing any new onset of "ringing in the ears,  or metallic taste in the mouth". No attempts were made at seeking any paresthesias. Safe injection practices and needle disposal techniques used. Medications properly checked for expiration dates. SDV (single dose vial) medications used. After the completion of the procedure, all disposable equipment used was discarded in the proper designated medical waste containers. Local Anesthesia: Protocol guidelines were followed. The patient was positioned over the fluoroscopy table. The area was prepped in the usual manner. The time-out was completed. The target area was identified using fluoroscopy. A 12-in long, straight, sterile hemostat was used with fluoroscopic guidance to locate the targets for each level blocked. Once located, the skin was marked with an approved surgical skin marker. Once all sites were marked, the skin (epidermis, dermis, and hypodermis), as well as deeper tissues (fat, connective tissue and muscle) were infiltrated with a small amount of a short-acting local anesthetic, loaded on a 10cc syringe with a 25G, 1.5-in  Needle. An appropriate amount of time was allowed for local anesthetics to take effect before proceeding to the next step. Local Anesthetic: Lidocaine 2.0% The unused portion of the local anesthetic was discarded in the proper designated containers. Technical description of process:  L2 Medial Branch Nerve Block (MBB): The target area for the L2 medial branch is at the junction of the postero-lateral aspect of the superior articular process and the superior, posterior, and medial edge of the transverse process of L3. Under fluoroscopic guidance, a Quincke needle was inserted until contact was made with os over the superior  postero-lateral aspect of the pedicular shadow (target area). After negative aspiration for blood, 0.5 mL of the nerve block solution was injected without difficulty or complication. The needle was removed intact. L3 Medial Branch Nerve Block (MBB): The target area for the L3 medial branch is at the junction of the postero-lateral aspect of the superior articular process and the superior, posterior, and medial edge of the transverse process of L4. Under fluoroscopic guidance, a Quincke needle was inserted until contact was made with os over the superior postero-lateral aspect of the pedicular shadow (target area). After negative aspiration for blood, 0.5 mL of the nerve block solution was injected without difficulty or complication. The needle was removed intact. L4 Medial Branch Nerve Block (MBB): The target area for the L4 medial branch is at the junction of the postero-lateral aspect of the superior articular process and the superior, posterior, and medial edge of the transverse process of L5. Under fluoroscopic guidance, a Quincke needle was inserted until contact was made with os over the superior postero-lateral aspect of the pedicular shadow (target area). After negative aspiration for blood, 0.5 mL of the nerve block solution was injected without difficulty or complication. The needle was removed intact. L5 Medial Branch Nerve Block (MBB): The target area for the L5 medial branch is at the junction of the postero-lateral aspect of the superior articular process and the superior, posterior, and medial edge of the sacral ala. Under fluoroscopic guidance, a Quincke needle was inserted until contact was made with os over the superior postero-lateral aspect of the pedicular shadow (target area). After negative aspiration for blood, 0.5 mL of the nerve block solution was injected without difficulty or complication. The needle was removed intact. S1 Medial Branch Nerve Block (MBB): The target area for the S1  medial branch is at the posterior and inferior 6 o'clock position of the L5-S1 facet joint. Under fluoroscopic guidance, the Quincke needle inserted for the L5 MBB was redirected until contact was made with  os over the inferior and postero aspect of the sacrum, at the 6 o' clock position under the L5-S1 facet joint (Target area). After negative aspiration for blood, 0.5 mL of the nerve block solution was injected without difficulty or complication. The needle was removed intact.  Once the entire procedure was completed, the treated area was cleaned, making sure to leave some of the prepping solution back to take advantage of its long term bactericidal properties.      Illustration of the posterior view of the lumbar spine and the posterior neural structures. Laminae of L2 through S1 are labeled. DPRL5, dorsal primary ramus of L5; DPRS1, dorsal primary ramus of S1; DPR3, dorsal primary ramus of L3; FJ, facet (zygapophyseal) joint L3-L4; I, inferior articular process of L4; LB1, lateral branch of dorsal primary ramus of L1; IAB, inferior articular branches from L3 medial branch (supplies L4-L5 facet joint); IBP, intermediate branch plexus; MB3, medial branch of dorsal primary ramus of L3; NR3, third lumbar nerve root; S, superior articular process of L5; SAB, superior articular branches from L4 (supplies L4-5 facet joint also); TP3, transverse process of L3.  There were no vitals filed for this visit.   Start Time:   hrs. End Time:   hrs.  Imaging Guidance (Spinal):          Type of Imaging Technique: Fluoroscopy Guidance (Spinal) Indication(s): Assistance in needle guidance and placement for procedures requiring needle placement in or near specific anatomical locations not easily accessible without such assistance. Exposure Time: Please see nurses notes. Contrast: None used. Fluoroscopic Guidance: I was personally present during the use of fluoroscopy. "Tunnel Vision Technique" used to obtain the  best possible view of the target area. Parallax error corrected before commencing the procedure. "Direction-depth-direction" technique used to introduce the needle under continuous pulsed fluoroscopy. Once target was reached, antero-posterior, oblique, and lateral fluoroscopic projection used confirm needle placement in all planes. Images permanently stored in EMR. Interpretation: No contrast injected. I personally interpreted the imaging intraoperatively. Adequate needle placement confirmed in multiple planes. Permanent images saved into the patient's record.  Antibiotic Prophylaxis:   Anti-infectives (From admission, onward)    None      Indication(s): None identified  Post-operative Assessment:  Post-procedure Vital Signs:  Pulse/HCG Rate:    Temp:   Resp:   BP:   SpO2:    EBL: None  Complications: No immediate post-treatment complications observed by team, or reported by patient.  Note: The patient tolerated the entire procedure well. A repeat set of vitals were taken after the procedure and the patient was kept under observation following institutional policy, for this type of procedure. Post-procedural neurological assessment was performed, showing return to baseline, prior to discharge. The patient was provided with post-procedure discharge instructions, including a section on how to identify potential problems. Should any problems arise concerning this procedure, the patient was given instructions to immediately contact us, at any time, without hesitation. In any case, we plan to contact the patient by telephone for a follow-up status report regarding this interventional procedure.  Comments:  No additional relevant information.  Plan of Care  Orders:  No orders of the defined types were placed in this encounter.  Chronic Opioid Analgesic:  Hydrocodone/APAP 5/325 1 tab p.o. twice daily (last filled on 05/07/2021) (prescribed by Leana Gamer, ANP) MME/day: 10 mg/day    Medications ordered for procedure: No orders of the defined types were placed in this encounter.  Medications administered: Duncan Dull had no medications  administered during this visit.  See the medical record for exact dosing, route, and time of administration.  Follow-up plan:   No follow-ups on file.       Interventional Therapies  Risk  Complexity Considerations:   Estimated body mass index is 38.55 kg/m as calculated from the following:   Height as of this encounter: '5\' 1"'  (1.549 m).   Weight as of this encounter: 204 lb (92.5 kg). IDDMM w/ complications & Hx of ketoacidosis PVD   Planned  Pending:   Diagnostic bilateral lumbar facet MBB #2 (without steroids)    Under consideration:   Diagnostic bilateral lumbar facet MBB #2 (without steroids)  Diagnostic bilateral IA hip joint inj. #1  Diagnostic right IA knee joint inj. #1  Diagnostic left genicular NB #1    Completed:   Diagnostic bilateral lumbar facet MBB x1 (without steroids) (06/26/2021) (100/100/50)    Therapeutic  Palliative (PRN) options:   None established       Recent Visits Date Type Provider Dept  07/12/21 Office Visit Milinda Pointer, MD [facility]  06/26/21 Procedure visit Milinda Pointer, MD [facility]  05/14/21 Office Visit Milinda Pointer, MD [facility]  Showing recent visits within past 90 days and meeting all other requirements Future Appointments Date Type Provider Dept  07/24/21 Appointment Milinda Pointer, MD [facility]  Showing future appointments within next 90 days and meeting all other requirements  Disposition: Discharge home  Discharge (Date  Time): 07/24/2021;   hrs.   Primary Care Physician: Flossie Buffy, NP Location: [facility] Note by: Gaspar Cola, MD Date: 07/24/2021; Time: 3:27 PM  Disclaimer:  Medicine is not an Chief Strategy Officer. The only guarantee in medicine is  that nothing is guaranteed. It is important to note that the decision to proceed with this intervention was based on the information collected from the patient. The Data and conclusions were drawn from the patient's questionnaire, the interview, and the physical examination. Because the information was provided in large part by the patient, it cannot be guaranteed that it has not been purposely or unconsciously manipulated. Every effort has been made to obtain as much relevant data as possible for this evaluation. It is important to note that the conclusions that lead to this procedure are derived in large part from the available data. Always take into account that the treatment will also be dependent on availability of resources and existing treatment guidelines, considered by other Pain Management Practitioners as being common knowledge and practice, at the time of the intervention. For Medico-Legal purposes, it is also important to point out that variation in procedural techniques and pharmacological choices are the acceptable norm. The indications, contraindications, technique, and results of the above procedure should only be interpreted and judged by a Board-Certified Interventional Pain Specialist with extensive familiarity and expertise in the same exact procedure and technique.

## 2021-07-23 ENCOUNTER — Encounter (HOSPITAL_BASED_OUTPATIENT_CLINIC_OR_DEPARTMENT_OTHER)
Admission: RE | Admit: 2021-07-23 | Discharge: 2021-07-23 | Disposition: A | Discharge status: Home / Self Care | Payer: Medicare HMO | Source: Ambulatory Visit | Attending: Surgery | Admitting: Surgery

## 2021-07-23 DIAGNOSIS — E669 Obesity, unspecified: Secondary | ICD-10-CM | POA: Diagnosis not present

## 2021-07-23 DIAGNOSIS — E119 Type 2 diabetes mellitus without complications: Secondary | ICD-10-CM | POA: Diagnosis not present

## 2021-07-23 DIAGNOSIS — Z87891 Personal history of nicotine dependence: Secondary | ICD-10-CM | POA: Diagnosis not present

## 2021-07-23 DIAGNOSIS — J45909 Unspecified asthma, uncomplicated: Secondary | ICD-10-CM | POA: Diagnosis not present

## 2021-07-23 DIAGNOSIS — Z01812 Encounter for preprocedural laboratory examination: Secondary | ICD-10-CM | POA: Insufficient documentation

## 2021-07-23 DIAGNOSIS — I10 Essential (primary) hypertension: Secondary | ICD-10-CM | POA: Diagnosis not present

## 2021-07-23 DIAGNOSIS — G4730 Sleep apnea, unspecified: Secondary | ICD-10-CM | POA: Diagnosis not present

## 2021-07-23 DIAGNOSIS — Z7984 Long term (current) use of oral hypoglycemic drugs: Secondary | ICD-10-CM | POA: Diagnosis not present

## 2021-07-23 DIAGNOSIS — Z6837 Body mass index (BMI) 37.0-37.9, adult: Secondary | ICD-10-CM | POA: Diagnosis not present

## 2021-07-23 DIAGNOSIS — R590 Localized enlarged lymph nodes: Secondary | ICD-10-CM | POA: Diagnosis present

## 2021-07-23 DIAGNOSIS — Z794 Long term (current) use of insulin: Secondary | ICD-10-CM | POA: Diagnosis not present

## 2021-07-23 DIAGNOSIS — Z8673 Personal history of transient ischemic attack (TIA), and cerebral infarction without residual deficits: Secondary | ICD-10-CM | POA: Diagnosis not present

## 2021-07-23 DIAGNOSIS — I739 Peripheral vascular disease, unspecified: Secondary | ICD-10-CM | POA: Diagnosis not present

## 2021-07-23 LAB — BASIC METABOLIC PANEL
Anion gap: 5 (ref 5–15)
BUN: 9 mg/dL (ref 6–20)
CO2: 24 mmol/L (ref 22–32)
Calcium: 9 mg/dL (ref 8.9–10.3)
Chloride: 112 mmol/L — ABNORMAL HIGH (ref 98–111)
Creatinine, Ser: 0.8 mg/dL (ref 0.44–1.00)
GFR, Estimated: >60 mL/min (ref >60)
Glucose, Bld: 76 mg/dL (ref 70–99)
Potassium: 5.6 mmol/L — ABNORMAL HIGH (ref 3.5–5.1)
Sodium: 141 mmol/L (ref 135–145)

## 2021-07-23 NOTE — Progress Notes (Signed)
K+ 5.6, will repeat BMP on day of surgery per Dr. Glennon Mac.

## 2021-07-23 NOTE — Progress Notes (Signed)

## 2021-07-24 ENCOUNTER — Ambulatory Visit
Admission: RE | Admit: 2021-07-24 | Discharge: 2021-07-24 | Disposition: A | Discharge status: Home / Self Care | Payer: Medicare HMO | Source: Ambulatory Visit | Attending: Pain Medicine | Admitting: Pain Medicine

## 2021-07-24 ENCOUNTER — Encounter: Payer: Self-pay | Admitting: Pain Medicine

## 2021-07-24 ENCOUNTER — Ambulatory Visit: Payer: Medicare HMO | Attending: Pain Medicine | Admitting: Pain Medicine

## 2021-07-24 VITALS — BP 129/89 | HR 82 | Temp 97.2°F | Resp 14 | Ht 61.0 in | Wt 198.0 lb

## 2021-07-24 DIAGNOSIS — E119 Type 2 diabetes mellitus without complications: Secondary | ICD-10-CM | POA: Diagnosis not present

## 2021-07-24 DIAGNOSIS — Z9889 Other specified postprocedural states: Secondary | ICD-10-CM | POA: Diagnosis not present

## 2021-07-24 DIAGNOSIS — M5137 Other intervertebral disc degeneration, lumbosacral region: Secondary | ICD-10-CM | POA: Insufficient documentation

## 2021-07-24 DIAGNOSIS — M47817 Spondylosis without myelopathy or radiculopathy, lumbosacral region: Secondary | ICD-10-CM | POA: Diagnosis present

## 2021-07-24 DIAGNOSIS — M47896 Other spondylosis, lumbar region: Secondary | ICD-10-CM | POA: Diagnosis not present

## 2021-07-24 DIAGNOSIS — G8929 Other chronic pain: Secondary | ICD-10-CM | POA: Insufficient documentation

## 2021-07-24 DIAGNOSIS — M545 Low back pain, unspecified: Secondary | ICD-10-CM | POA: Diagnosis present

## 2021-07-24 DIAGNOSIS — Z7951 Long term (current) use of inhaled steroids: Secondary | ICD-10-CM | POA: Diagnosis not present

## 2021-07-24 DIAGNOSIS — Z79899 Other long term (current) drug therapy: Secondary | ICD-10-CM | POA: Insufficient documentation

## 2021-07-24 DIAGNOSIS — Z7985 Long-term (current) use of injectable non-insulin antidiabetic drugs: Secondary | ICD-10-CM | POA: Diagnosis not present

## 2021-07-24 DIAGNOSIS — M47816 Spondylosis without myelopathy or radiculopathy, lumbar region: Secondary | ICD-10-CM | POA: Diagnosis present

## 2021-07-24 DIAGNOSIS — Z981 Arthrodesis status: Secondary | ICD-10-CM | POA: Insufficient documentation

## 2021-07-24 DIAGNOSIS — Z7984 Long term (current) use of oral hypoglycemic drugs: Secondary | ICD-10-CM | POA: Diagnosis not present

## 2021-07-24 DIAGNOSIS — R937 Abnormal findings on diagnostic imaging of other parts of musculoskeletal system: Secondary | ICD-10-CM | POA: Insufficient documentation

## 2021-07-24 DIAGNOSIS — Z90710 Acquired absence of both cervix and uterus: Secondary | ICD-10-CM | POA: Insufficient documentation

## 2021-07-24 DIAGNOSIS — Z6837 Body mass index (BMI) 37.0-37.9, adult: Secondary | ICD-10-CM | POA: Insufficient documentation

## 2021-07-24 DIAGNOSIS — Z7982 Long term (current) use of aspirin: Secondary | ICD-10-CM | POA: Diagnosis not present

## 2021-07-24 MED ORDER — MIDAZOLAM HCL 5 MG/5ML IJ SOLN
0.5000 mg | Freq: Once | INTRAMUSCULAR | Status: AC
  Administered 2021-07-24: 3 mg via INTRAVENOUS

## 2021-07-24 MED ORDER — MIDAZOLAM HCL 5 MG/5ML IJ SOLN
INTRAMUSCULAR | Status: AC
  Filled 2021-07-24: qty 5

## 2021-07-24 MED ORDER — ROPIVACAINE HCL 2 MG/ML IJ SOLN
18.0000 mL | Freq: Once | INTRAMUSCULAR | Status: AC
  Administered 2021-07-24: 18 mL via PERINEURAL

## 2021-07-24 MED ORDER — LIDOCAINE HCL 2 % IJ SOLN
20.0000 mL | Freq: Once | INTRAMUSCULAR | Status: AC
  Administered 2021-07-24: 400 mg

## 2021-07-24 MED ORDER — ROPIVACAINE HCL 2 MG/ML IJ SOLN
INTRAMUSCULAR | Status: AC
  Filled 2021-07-24: qty 20

## 2021-07-24 MED ORDER — FENTANYL CITRATE (PF) 100 MCG/2ML IJ SOLN
25.0000 ug | INTRAMUSCULAR | Status: DC | PRN
  Administered 2021-07-24: 100 ug via INTRAVENOUS

## 2021-07-24 MED ORDER — FENTANYL CITRATE (PF) 100 MCG/2ML IJ SOLN
INTRAMUSCULAR | Status: AC
  Filled 2021-07-24: qty 2

## 2021-07-24 MED ORDER — LIDOCAINE HCL 2 % IJ SOLN
INTRAMUSCULAR | Status: AC
  Filled 2021-07-24: qty 20

## 2021-07-24 MED ORDER — LACTATED RINGERS IV SOLN: Freq: Once | INTRAVENOUS | Status: AC

## 2021-07-24 MED ORDER — TRIAMCINOLONE ACETONIDE 40 MG/ML IJ SUSP
INTRAMUSCULAR | Status: AC
  Filled 2021-07-24: qty 2

## 2021-07-24 NOTE — Progress Notes (Signed)
Safety precautions to be maintained throughout the outpatient stay will include: orient to surroundings, keep bed in low position, maintain call bell within reach at all times, provide assistance with transfer out of bed and ambulation.  

## 2021-07-24 NOTE — Patient Instructions (Signed)
____________________________________________________________________________________________  Virtual Visits   What is a "Virtual Visit"? It is a Metallurgist (medical visit) that takes place on real time (NOT TEXT or E-MAIL) over the telephone or computer device (desktop, laptop, tablet, smart phone, etc.). It allows for more location flexibility between the patient and the healthcare provider.  Who decides when these types of visits will be used? The physician.  Who is eligible for these types of visits? Only those patients that can be reliably reached over the telephone.  What do you mean by reliably? We do not have time to call everyone multiple times, therefore those that tend to screen calls and then call back later are not suitable candidates for this system. We understand how people are reluctant to pickup on "unknown" calls, therefore, we suggest adding our telephone numbers to your list of "CONTACT(s)". This way, you should be able to readily identify our calls when you receive one. All of our numbers are available below.   Who is not eligible? This option is not available for medication management encounters, specially for controlled substances. Patients on pain medications that fall under the category of controlled substances have to come in for "Face-to-Face" encounters. This is required for mandatory monitoring of these substances. You may be asked to provide a sample for an unannounced urine drug screening test (UDS), and we will need to count your pain pills. Not bringing your pills to be counted may result in no refill. Obviously, neither one of these can be done over the phone.  When will this type of visits be used? You can request a virtual visit whenever you are physically unable to attend a regular appointment. The decision will be made by the physician (or healthcare provider) on a case by case basis.   At what time will I be called? This is an  excellent question. The providers will try to call you whenever they have time available. Do not expect to be called at any specific time. The secretaries will assign you a time for your virtual visit appointment, but this is done simply to keep a list of those patients that need to be called, but not for the purpose of keeping a time schedule. Be advised that the call may come in anytime during the day, between the hours of 8:00 AM and 8::00 PM, depending on provider availability. We do understand that the system is not perfect. If you are unable to be available that day on a moments notice, then request an "in-person" appointment rather than a "virtual visit".  Can I request my medication visits to be "Virtual"? Yes you may request it, but the decision is entirely up to the healthcare provider. Control substances require specific monitoring that requires Face-to-Face encounters. The number of encounters  and the extent of the monitoring is determined on a case by case basis.  Add a new contact to your smart phone and label it "PAIN CLINIC" Under this contact add the following numbers: Main: [phone] (Official Contact Number) Nurses: [phone] (These are outgoing only calling systems. Do not call this number.) Dr. Dossie Arbour: [phone] or [phone] (Outgoing calls only. Do not call this number.)  ____________________________________________________________________________________________  ____________________________________________________________________________________________  Post-Procedure Discharge Instructions  Instructions: Apply ice:  Purpose: This will minimize any swelling and discomfort after procedure.  When: Day of procedure, as soon as you get home. How: Fill a plastic sandwich bag with crushed ice. Cover it with a small towel and apply to injection  site. How long: (15 min on, 15 min off) Apply for 15 minutes then remove x 15 minutes.  Repeat sequence on day of  procedure, until you go to bed. Apply heat:  Purpose: To treat any soreness and discomfort from the procedure. When: Starting the next day after the procedure. How: Apply heat to procedure site starting the day following the procedure. How long: May continue to repeat daily, until discomfort goes away. Food intake: Start with clear liquids (like water) and advance to regular food, as tolerated.  Physical activities: Keep activities to a minimum for the first 8 hours after the procedure. After that, then as tolerated. Driving: If you have received any sedation, be responsible and do not drive. You are not allowed to drive for 24 hours after having sedation. Blood thinner: (Applies only to those taking blood thinners) You may restart your blood thinner 6 hours after your procedure. Insulin: (Applies only to Diabetic patients taking insulin) As soon as you can eat, you may resume your normal dosing schedule. Infection prevention: Keep procedure site clean and dry. Shower daily and clean area with soap and water. Post-procedure Pain Diary: Extremely important that this be done correctly and accurately. Recorded information will be used to determine the next step in treatment. For the purpose of accuracy, follow these rules: Evaluate only the area treated. Do not report or include pain from an untreated area. For the purpose of this evaluation, ignore all other areas of pain, except for the treated area. After your procedure, avoid taking a long nap and attempting to complete the pain diary after you wake up. Instead, set your alarm clock to go off every hour, on the hour, for the initial 8 hours after the procedure. Document the duration of the numbing medicine, and the relief you are getting from it. Do not go to sleep and attempt to complete it later. It will not be accurate. If you received sedation, it is likely that you were given a medication that may cause amnesia. Because of this, completing the  diary at a later time may cause the information to be inaccurate. This information is needed to plan your care. Follow-up appointment: Keep your post-procedure follow-up evaluation appointment after the procedure (usually 2 weeks for most procedures, 6 weeks for radiofrequencies). DO NOT FORGET to bring you pain diary with you.   Expect: (What should I expect to see with my procedure?) From numbing medicine (AKA: Local Anesthetics): Numbness or decrease in pain. You may also experience some weakness, which if present, could last for the duration of the local anesthetic. Onset: Full effect within 15 minutes of injected. Duration: It will depend on the type of local anesthetic used. On the average, 1 to 8 hours.  From steroids: NO STEROIDS injected From procedure: Some discomfort is to be expected once the numbing medicine wears off. This should be minimal if ice and heat are applied as instructed.  Call if: (When should I call?) You experience numbness and weakness that gets worse with time, as opposed to wearing off. New onset bowel or bladder incontinence. (Applies only to procedures done in the spine)  Emergency Numbers: Durning business hours (Monday - Thursday, 8:00 AM - 4:00 PM) (Friday, 9:00 AM - 12:00 Noon): [phone] After hours: [phone] NOTE: If you are having a problem and are unable connect with, or to talk to a provider, then go to your nearest urgent care or emergency department. If the problem is serious and urgent, please  call 911. ____________________________________________________________________________________________

## 2021-07-24 NOTE — H&P (Signed)
REFERRING PHYSICIAN: Nche, Charlene Brooke, NP  PROVIDER: Beverlee Nims, MD  MRN: [id number] DOB: 1960-01-22 DATE OF ENCOUNTER: 07/03/2021 Subjective   Chief Complaint: adenopathy   History of Present Illness: Autumn Valenzuela is a 61 y.o. female who is seen today as an office consultation for evaluation of adenopathy .   This is a 61 year old female who is referred here for inguinal lymphadenopathy. She had a left common femoral endarterectomy in January of this year. Because of persistent inguinal discomfort a CT scan was performed in March showing inflammatory changes and several enlarged lymph nodes with the largest being 10 mm. A follow-up ultrasound in April showed there was still some persistent lymphadenopathy with a large lymph node now being only 7 mm. Because of her night sweats and elevated white blood count, she has been referred here to consider an excisional biopsy of the lymph node to rule out malignancy. She is otherwise without complaints. She has no other lymphadenopathy anywhere. She had a recent mammogram yesterday which was unremarkable. The CT scan of her abdomen and pelvis was otherwise unremarkable  Review of Systems: A complete review of systems was obtained from the patient. I have reviewed this information and discussed as appropriate with the patient. See HPI as well for other ROS.  ROS   Medical History: Past Medical History:  Diagnosis Date  Anxiety  Asthma, unspecified asthma severity, unspecified whether complicated, unspecified whether persistent  Diabetes mellitus without complication (CMS-HCC)  History of stroke  Hyperlipidemia  Hypertension   There is no problem list on file for this patient.  History reviewed. No pertinent surgical history.   No Known Allergies  Current Outpatient Medications on File Prior to Visit  Medication Sig Dispense Refill  albuterol 90 mcg/actuation inhaler Inhale into the lungs  aspirin 81 MG EC tablet   BASAGLAR KWIKPEN U-100 INSULIN pen injector (concentration 100 units/mL) ADMINISTER 100 UNITS UNDER THE SKIN DAILY  buPROPion (WELLBUTRIN) 75 MG tablet Take 75 mg by mouth 2 (two) times daily  cholecalciferol (VITAMIN D3) 1,250 mcg (50,000 unit) capsule Take 1 capsule by mouth once a week  DULoxetine (CYMBALTA) 60 MG DR capsule Take 60 mg by mouth 2 (two) times daily  empagliflozin (JARDIANCE) 10 mg tablet Take by mouth  fluticasone propionate (FLONASE) 50 mcg/actuation nasal spray Place into one nostril  gabapentin (NEURONTIN) 300 MG capsule Take 1 capsule by mouth 3 (three) times daily  HYDROcodone-acetaminophen (NORCO) 5-325 mg tablet Take by mouth  metFORMIN (GLUCOPHAGE-XR) 500 MG XR tablet  NOVOLOG FLEXPEN U-100 INSULIN pen injector (concentration 100 units/mL) INJECT MAX OF 30 UNITS DAILY  ONETOUCH VERIO TEST STRIPS test strip CHECK UPTO FOUR TIMES DAILY AS DIRECTED  rosuvastatin (CRESTOR) 20 MG tablet Take 20 mg by mouth once daily  calcium carbonate-vitamin D3 (OS-CAL 500+D) 500 mg-5 mcg (200 unit) tablet Take 1 tablet by mouth daily with breakfast   No current facility-administered medications on file prior to visit.   History reviewed. No pertinent family history.   Social History   Tobacco Use  Smoking Status Former  Types: Cigarettes  Smokeless Tobacco Never    Social History   Socioeconomic History  Marital status: Single  Tobacco Use  Smoking status: Former  Types: Cigarettes  Smokeless tobacco: Never  Substance and Sexual Activity  Alcohol use: Not Currently  Drug use: Yes   Objective:   Vitals:  07/03/21 0923  BP: 120/80  Pulse: 74  Temp: 36.4 C (97.6 F)  SpO2: 95%  Weight: 90.3  kg (199 lb)  Height: 154.9 cm ('5\' 1"'$ )   Body mass index is 37.6 kg/m.  Physical Exam   On exam she appears well  Her abdomen is soft and obese.  He has a well-healed incision in the left inguinal area from her endarterectomy. There is questionable shotty adenopathy  but it is difficult to tell if secondary to the scarring. There is no right inguinal lymphadenopathy  Labs, Imaging and Diagnostic Testing: I reviewed the CT scan and ultrasound  Assessment and Plan:   Diagnoses and all orders for this visit:  Lymphadenopathy, inguinal, LEFT    I suspect this is reactive lymphadenopathy that is slowly improving. However, she does have night sweats and persistent discomfort so her primary care providers recommended excisional biopsy of  left inguinal lymph nodes to rule out malignancy. I discussed the procedure with her in detail. I discussed the risks which includes but is not limited to bleeding, infection, chronic swelling and seroma formation, injury to surrounding structures, the biopsy being nondiagnostic, the procedure causing swelling of lymph nodes again, cardiopulmonary issues, etc. She understands and after lengthy discussion still wishes to proceed with surgery which will be scheduled. Again this will be a excisional biopsy of the left inguinal lymphadenopathy

## 2021-07-25 ENCOUNTER — Encounter (HOSPITAL_BASED_OUTPATIENT_CLINIC_OR_DEPARTMENT_OTHER): Payer: Self-pay | Admitting: Surgery

## 2021-07-25 ENCOUNTER — Ambulatory Visit (HOSPITAL_BASED_OUTPATIENT_CLINIC_OR_DEPARTMENT_OTHER)
Admission: RE | Admit: 2021-07-25 | Discharge: 2021-07-25 | Disposition: A | Discharge status: Home / Self Care | Payer: Medicare HMO | Attending: Surgery | Admitting: Surgery

## 2021-07-25 ENCOUNTER — Encounter (HOSPITAL_BASED_OUTPATIENT_CLINIC_OR_DEPARTMENT_OTHER)
Admission: RE | Disposition: A | Discharge status: Home / Self Care | Payer: Self-pay | Source: Home / Self Care | Attending: Surgery

## 2021-07-25 ENCOUNTER — Ambulatory Visit (HOSPITAL_BASED_OUTPATIENT_CLINIC_OR_DEPARTMENT_OTHER): Payer: Medicare HMO | Admitting: Certified Registered Nurse Anesthetist

## 2021-07-25 ENCOUNTER — Other Ambulatory Visit: Payer: Self-pay

## 2021-07-25 ENCOUNTER — Telehealth: Payer: Self-pay | Admitting: *Deleted

## 2021-07-25 DIAGNOSIS — Z87891 Personal history of nicotine dependence: Secondary | ICD-10-CM | POA: Insufficient documentation

## 2021-07-25 DIAGNOSIS — R590 Localized enlarged lymph nodes: Secondary | ICD-10-CM | POA: Insufficient documentation

## 2021-07-25 DIAGNOSIS — Z8673 Personal history of transient ischemic attack (TIA), and cerebral infarction without residual deficits: Secondary | ICD-10-CM | POA: Diagnosis not present

## 2021-07-25 DIAGNOSIS — Z7984 Long term (current) use of oral hypoglycemic drugs: Secondary | ICD-10-CM

## 2021-07-25 DIAGNOSIS — I739 Peripheral vascular disease, unspecified: Secondary | ICD-10-CM | POA: Diagnosis not present

## 2021-07-25 DIAGNOSIS — E669 Obesity, unspecified: Secondary | ICD-10-CM | POA: Insufficient documentation

## 2021-07-25 DIAGNOSIS — E119 Type 2 diabetes mellitus without complications: Secondary | ICD-10-CM | POA: Diagnosis not present

## 2021-07-25 DIAGNOSIS — J45909 Unspecified asthma, uncomplicated: Secondary | ICD-10-CM | POA: Diagnosis not present

## 2021-07-25 DIAGNOSIS — Z794 Long term (current) use of insulin: Secondary | ICD-10-CM | POA: Diagnosis not present

## 2021-07-25 DIAGNOSIS — Z6837 Body mass index (BMI) 37.0-37.9, adult: Secondary | ICD-10-CM | POA: Insufficient documentation

## 2021-07-25 DIAGNOSIS — E1151 Type 2 diabetes mellitus with diabetic peripheral angiopathy without gangrene: Secondary | ICD-10-CM

## 2021-07-25 DIAGNOSIS — I10 Essential (primary) hypertension: Secondary | ICD-10-CM | POA: Insufficient documentation

## 2021-07-25 DIAGNOSIS — G4730 Sleep apnea, unspecified: Secondary | ICD-10-CM | POA: Diagnosis not present

## 2021-07-25 DIAGNOSIS — Z01818 Encounter for other preprocedural examination: Secondary | ICD-10-CM

## 2021-07-25 HISTORY — PX: INGUINAL LYMPH NODE BIOPSY: SHX5865

## 2021-07-25 LAB — BASIC METABOLIC PANEL
Anion gap: 12 (ref 5–15)
BUN: 9 mg/dL (ref 6–20)
CO2: 22 mmol/L (ref 22–32)
Calcium: 9.2 mg/dL (ref 8.9–10.3)
Chloride: 109 mmol/L (ref 98–111)
Creatinine, Ser: 0.73 mg/dL (ref 0.44–1.00)
GFR, Estimated: >60 mL/min (ref >60)
Glucose, Bld: 98 mg/dL (ref 70–99)
Potassium: 3.7 mmol/L (ref 3.5–5.1)
Sodium: 143 mmol/L (ref 135–145)

## 2021-07-25 LAB — GLUCOSE, CAPILLARY
Glucose-Capillary: 95 mg/dL (ref 70–99)
Glucose-Capillary: 74 mg/dL (ref 70–99)

## 2021-07-25 SURGERY — BIOPSY, LYMPH NODE, INGUINAL, OPEN
Anesthesia: General | Site: Groin | Laterality: Left

## 2021-07-25 MED ORDER — FENTANYL CITRATE (PF) 100 MCG/2ML IJ SOLN: 25.0000 ug | INTRAMUSCULAR | Status: DC | PRN

## 2021-07-25 MED ORDER — BUPIVACAINE-EPINEPHRINE 0.5% -1:200000 IJ SOLN
INTRAMUSCULAR | Status: DC | PRN
  Administered 2021-07-25: 10 mL

## 2021-07-25 MED ORDER — ONDANSETRON HCL 4 MG/2ML IJ SOLN
INTRAMUSCULAR | Status: AC
  Filled 2021-07-25: qty 2

## 2021-07-25 MED ORDER — PROPOFOL 10 MG/ML IV BOLUS
INTRAVENOUS | Status: AC
  Filled 2021-07-25: qty 20

## 2021-07-25 MED ORDER — ACETAMINOPHEN 500 MG PO TABS
1000.0000 mg | ORAL_TABLET | ORAL | Status: AC
  Administered 2021-07-25: 1000 mg via ORAL

## 2021-07-25 MED ORDER — CEFAZOLIN SODIUM-DEXTROSE 2-4 GM/100ML-% IV SOLN
2.0000 g | INTRAVENOUS | Status: AC
  Administered 2021-07-25: 2 g via INTRAVENOUS

## 2021-07-25 MED ORDER — MIDAZOLAM HCL 2 MG/2ML IJ SOLN
INTRAMUSCULAR | Status: AC
  Filled 2021-07-25: qty 2

## 2021-07-25 MED ORDER — OXYCODONE HCL 5 MG PO TABS: 5.0000 mg | ORAL_TABLET | Freq: Once | ORAL | Status: DC | PRN

## 2021-07-25 MED ORDER — ONDANSETRON HCL 4 MG/2ML IJ SOLN
INTRAMUSCULAR | Status: DC | PRN
  Administered 2021-07-25: 4 mg via INTRAVENOUS

## 2021-07-25 MED ORDER — CHLORHEXIDINE GLUCONATE CLOTH 2 % EX PADS: 6.0000 | MEDICATED_PAD | Freq: Once | CUTANEOUS | Status: DC

## 2021-07-25 MED ORDER — OXYCODONE HCL 5 MG PO TABS: 5.0000 mg | ORAL_TABLET | Freq: Four times a day (QID) | ORAL | 0 refills | Status: DC | PRN

## 2021-07-25 MED ORDER — FENTANYL CITRATE (PF) 100 MCG/2ML IJ SOLN
INTRAMUSCULAR | Status: AC
  Filled 2021-07-25: qty 2

## 2021-07-25 MED ORDER — PROPOFOL 10 MG/ML IV BOLUS
INTRAVENOUS | Status: DC | PRN
  Administered 2021-07-25: 180 mg via INTRAVENOUS

## 2021-07-25 MED ORDER — PROMETHAZINE HCL 25 MG/ML IJ SOLN: 6.2500 mg | INTRAMUSCULAR | Status: DC | PRN

## 2021-07-25 MED ORDER — LACTATED RINGERS IV SOLN: INTRAVENOUS | Status: DC

## 2021-07-25 MED ORDER — AMISULPRIDE (ANTIEMETIC) 5 MG/2ML IV SOLN: 10.0000 mg | Freq: Once | INTRAVENOUS | Status: DC | PRN

## 2021-07-25 MED ORDER — FENTANYL CITRATE (PF) 100 MCG/2ML IJ SOLN
INTRAMUSCULAR | Status: DC | PRN
  Administered 2021-07-25 (×2): 50 ug via INTRAVENOUS

## 2021-07-25 MED ORDER — ACETAMINOPHEN 500 MG PO TABS
ORAL_TABLET | ORAL | Status: AC
  Filled 2021-07-25: qty 2

## 2021-07-25 MED ORDER — LIDOCAINE 2% (20 MG/ML) 5 ML SYRINGE
INTRAMUSCULAR | Status: AC
  Filled 2021-07-25: qty 5

## 2021-07-25 MED ORDER — DEXAMETHASONE SODIUM PHOSPHATE 4 MG/ML IJ SOLN
INTRAMUSCULAR | Status: DC | PRN
  Administered 2021-07-25: 5 mg via INTRAVENOUS

## 2021-07-25 MED ORDER — OXYCODONE HCL 5 MG/5ML PO SOLN: 5.0000 mg | Freq: Once | ORAL | Status: DC | PRN

## 2021-07-25 MED ORDER — CEFAZOLIN SODIUM-DEXTROSE 2-4 GM/100ML-% IV SOLN
INTRAVENOUS | Status: AC
  Filled 2021-07-25: qty 100

## 2021-07-25 MED ORDER — PHENYLEPHRINE 80 MCG/ML (10ML) SYRINGE FOR IV PUSH (FOR BLOOD PRESSURE SUPPORT)
PREFILLED_SYRINGE | INTRAVENOUS | Status: AC
  Filled 2021-07-25: qty 10

## 2021-07-25 MED ORDER — LIDOCAINE HCL (CARDIAC) PF 100 MG/5ML IV SOSY
PREFILLED_SYRINGE | INTRAVENOUS | Status: DC | PRN
  Administered 2021-07-25: 50 mg via INTRAVENOUS

## 2021-07-25 MED ORDER — MIDAZOLAM HCL 5 MG/5ML IJ SOLN
INTRAMUSCULAR | Status: DC | PRN
  Administered 2021-07-25: 2 mg via INTRAVENOUS

## 2021-07-25 SURGICAL SUPPLY — 45 items
ADH SKN CLS APL DERMABOND .7 (GAUZE/BANDAGES/DRESSINGS) ×2
APL PRP STRL LF DISP 70% ISPRP (MISCELLANEOUS) ×1
APPLIER CLIP 9.375 MED OPEN (MISCELLANEOUS) ×2
APR CLP MED 9.3 20 MLT OPN (MISCELLANEOUS) ×1
BLADE SURG 15 STRL LF DISP TIS (BLADE) ×1 IMPLANT
BLADE SURG 15 STRL SS (BLADE) ×2
CANISTER SUCT 1200ML W/VALVE (MISCELLANEOUS) ×2 IMPLANT
CHLORAPREP W/TINT 26 (MISCELLANEOUS) ×2 IMPLANT
CLIP APPLIE 9.375 MED OPEN (MISCELLANEOUS) ×1 IMPLANT
COVER BACK TABLE 60X90IN (DRAPES) ×2 IMPLANT
COVER MAYO STAND STRL (DRAPES) ×2 IMPLANT
DERMABOND ADVANCED (GAUZE/BANDAGES/DRESSINGS) ×2
DERMABOND ADVANCED .7 DNX12 (GAUZE/BANDAGES/DRESSINGS) ×2 IMPLANT
DRAIN CHANNEL 19F RND (DRAIN) ×2 IMPLANT
DRAIN PENROSE 12X.25 LTX STRL (MISCELLANEOUS) IMPLANT
DRAPE LAPAROSCOPIC ABDOMINAL (DRAPES) IMPLANT
DRAPE LAPAROTOMY 100X72 PEDS (DRAPES) ×2 IMPLANT
DRAPE UTILITY XL STRL (DRAPES) ×2 IMPLANT
ELECT REM PT RETURN 9FT ADLT (ELECTROSURGICAL) ×2
ELECTRODE REM PT RTRN 9FT ADLT (ELECTROSURGICAL) ×1 IMPLANT
EVACUATOR SILICONE 100CC (DRAIN) ×2 IMPLANT
GLOVE SURG SIGNA 7.5 PF LTX (GLOVE) ×2 IMPLANT
GOWN STRL REUS W/ TWL LRG LVL3 (GOWN DISPOSABLE) ×1 IMPLANT
GOWN STRL REUS W/ TWL XL LVL3 (GOWN DISPOSABLE) ×1 IMPLANT
GOWN STRL REUS W/TWL LRG LVL3 (GOWN DISPOSABLE) ×2
GOWN STRL REUS W/TWL XL LVL3 (GOWN DISPOSABLE) ×2
NDL HYPO 25X1 1.5 SAFETY (NEEDLE) ×1 IMPLANT
NEEDLE HYPO 25X1 1.5 SAFETY (NEEDLE) ×2 IMPLANT
NS IRRIG 1000ML POUR BTL (IV SOLUTION) ×2 IMPLANT
PACK BASIN DAY SURGERY FS (CUSTOM PROCEDURE TRAY) ×2 IMPLANT
PENCIL SMOKE EVACUATOR (MISCELLANEOUS) ×2 IMPLANT
PIN SAFETY STERILE (MISCELLANEOUS) ×2 IMPLANT
SLEEVE SCD COMPRESS KNEE MED (STOCKING) ×2 IMPLANT
SPIKE FLUID TRANSFER (MISCELLANEOUS) IMPLANT
SPONGE T-LAP 4X18 ~~LOC~~+RFID (SPONGE) ×4 IMPLANT
STAPLER VISISTAT 35W (STAPLE) IMPLANT
SUT ETHILON 2 0 FS 18 (SUTURE) ×2 IMPLANT
SUT MNCRL AB 4-0 PS2 18 (SUTURE) ×2 IMPLANT
SUT VIC AB 3-0 SH 27 (SUTURE) ×2
SUT VIC AB 3-0 SH 27X BRD (SUTURE) ×1 IMPLANT
SYR BULB EAR ULCER 3OZ GRN STR (SYRINGE) IMPLANT
SYR CONTROL 10ML LL (SYRINGE) ×2 IMPLANT
TOWEL GREEN STERILE FF (TOWEL DISPOSABLE) ×2 IMPLANT
TUBE CONNECTING 20X1/4 (TUBING) ×2 IMPLANT
YANKAUER SUCT BULB TIP NO VENT (SUCTIONS) ×2 IMPLANT

## 2021-07-25 NOTE — Anesthesia Preprocedure Evaluation (Signed)
Anesthesia Evaluation  Patient identified by MRN, date of birth, ID band Patient awake    Reviewed: Allergy & Precautions, NPO status , Patient's Chart, lab work & pertinent test results  Airway Mallampati: II  TM Distance: >3 FB Neck ROM: Full    Dental  (+) Missing   Pulmonary asthma , sleep apnea , former smoker,    Pulmonary exam normal        Cardiovascular hypertension, + Peripheral Vascular Disease  Normal cardiovascular exam     Neuro/Psych  Headaches, PSYCHIATRIC DISORDERS Anxiety Depression Bipolar Disorder  Neuromuscular disease CVA, No Residual Symptoms    GI/Hepatic negative GI ROS, Neg liver ROS,   Endo/Other  diabetes, Insulin Dependent, Oral Hypoglycemic Agents  Renal/GU negative Renal ROS     Musculoskeletal  (+) Arthritis ,   Abdominal (+) + obese,   Peds  Hematology negative hematology ROS (+)   Anesthesia Other Findings LEFT INGUINAL LYMPHADENOPATHY  Reproductive/Obstetrics                             Anesthesia Physical Anesthesia Plan  ASA: 3  Anesthesia Plan: General   Post-op Pain Management:    Induction: Intravenous  PONV Risk Score and Plan: 3 and Ondansetron, Dexamethasone, Midazolam and Treatment may vary due to age or medical condition  Airway Management Planned: LMA  Additional Equipment:   Intra-op Plan:   Post-operative Plan: Extubation in OR  Informed Consent: I have reviewed the patients History and Physical, chart, labs and discussed the procedure including the risks, benefits and alternatives for the proposed anesthesia with the patient or authorized representative who has indicated his/her understanding and acceptance.     Dental advisory given  Plan Discussed with: CRNA  Anesthesia Plan Comments:         Anesthesia Quick Evaluation

## 2021-07-25 NOTE — Op Note (Signed)
EXCISIONAL BIOPSY LEFT INGUINAL LYMPH NODE  Procedure Note  Autumn Valenzuela 07/25/2021   Pre-op Diagnosis: LEFT INGUINAL LYMPHADENOPATHY     Post-op Diagnosis: same  Procedure(s): EXCISIONAL BIOPSY LEFT INGUINAL LYMPH NODE  Surgeon(s): Coralie Keens, MD  Anesthesia: General  Staff:  Circulator: Maurene Capes, RN Scrub Person: Romero Liner, CST  Estimated Blood Loss: Minimal               Specimens: sent to path  Indications: This is a 61 year old female who has persistent left inguinal lymphadenopathy and night sweats.  Because of this, a left inguinal incisional lymph node biopsy has been recommended and requested  Procedure: The patient is brought to operating identifies the correct patient.  She is placed upon the operating table general anesthesia was induced.  Her left inguinal area was prepped and draped in usual sterile fashion.  I anesthetized the skin over a palpable lymph node below the inguinal ligament on the left side with Marcaine.  I then made a longitudinal incision with a scalpel.  I then dissected down through the subcutaneous tissue to the palpable lymph node.  It was slightly larger than a centimeter in size.  I excised it in its entirety with the cautery and surgical clips.  The lymph node was then sent to pathology for evaluation.  No other significant lymphadenopathy was palpated.  Hemostasis peer to be achieved.  I anesthetized the incision further with Marcaine.  I then closed the subcutaneous tissue with interrupted 3-0 Vicryl sutures and closed the skin with a running 4-0 Monocryl.  Dermabond was then applied.  The patient tolerated the procedure well.  All the counts were correct at the end of the procedure.  The patient was then extubated in the operating room and taken in stable condition to the recovery room.          Coralie Keens   Date: 07/25/2021  Time: 12:21 PM

## 2021-07-25 NOTE — Telephone Encounter (Signed)
No problems post procedure. 

## 2021-07-25 NOTE — Transfer of Care (Signed)
Immediate Anesthesia Transfer of Care Note  Patient: Autumn Valenzuela  Procedure(s) Performed: EXCISIONAL BIOPSY LEFT INGUINAL LYMPH NODES (Left: Groin)  Patient Location: PACU  Anesthesia Type:General  Level of Consciousness: awake, alert  and oriented  Airway & Oxygen Therapy: Patient Spontanous Breathing and Patient connected to nasal cannula oxygen  Post-op Assessment: Report given to RN and Post -op Vital signs reviewed and stable  Post vital signs: Reviewed and stable  Last Vitals:  Vitals Value Taken Time  BP 119/78 07/25/21 1230  Temp    Pulse 74 07/25/21 1232  Resp 21 07/25/21 1232  SpO2 100 % 07/25/21 1232  Vitals shown include unvalidated device data.  Last Pain:  Vitals:   07/25/21 1103  TempSrc: Oral  PainSc: 3          Complications: No notable events documented.

## 2021-07-25 NOTE — Discharge Instructions (Addendum)
You may shower starting tomorrow  Ice pack, Tylenol, and ibuprofen for pain  No vigorous activity for 1 week   May take Tylenol after 5:30pm, if needed   Post Anesthesia Home Care Instructions  Activity: Get plenty of rest for the remainder of the day. A responsible individual must stay with you for 24 hours following the procedure.  For the next 24 hours, DO NOT: -Drive a car -Paediatric nurse -Drink alcoholic beverages -Take any medication unless instructed by your physician -Make any legal decisions or sign important papers.  Meals: Start with liquid foods such as gelatin or soup. Progress to regular foods as tolerated. Avoid greasy, spicy, heavy foods. If nausea and/or vomiting occur, drink only clear liquids until the nausea and/or vomiting subsides. Call your physician if vomiting continues.  Special Instructions/Symptoms: Your throat may feel dry or sore from the anesthesia or the breathing tube placed in your throat during surgery. If this causes discomfort, gargle with warm salt water. The discomfort should disappear within 24 hours.  If you had a scopolamine patch placed behind your ear for the management of post- operative nausea and/or vomiting:  1. The medication in the patch is effective for 72 hours, after which it should be removed.  Wrap patch in a tissue and discard in the trash. Wash hands thoroughly with soap and water. 2. You may remove the patch earlier than 72 hours if you experience unpleasant side effects which may include dry mouth, dizziness or visual disturbances. 3. Avoid touching the patch. Wash your hands with soap and water after contact with the patch.

## 2021-07-25 NOTE — Anesthesia Procedure Notes (Signed)
Procedure Name: LMA Insertion Date/Time: 07/25/2021 11:57 AM  Performed by: Bufford Spikes, CRNAPre-anesthesia Checklist: Patient identified, Emergency Drugs available, Suction available and Patient being monitored Patient Re-evaluated:Patient Re-evaluated prior to induction Oxygen Delivery Method: Circle system utilized Preoxygenation: Pre-oxygenation with 100% oxygen Induction Type: IV induction Ventilation: Mask ventilation without difficulty LMA: LMA inserted LMA Size: 4.0 Number of attempts: 1 Placement Confirmation: positive ETCO2 Tube secured with: Tape Dental Injury: Teeth and Oropharynx as per pre-operative assessment

## 2021-07-25 NOTE — Interval H&P Note (Signed)
History and Physical Interval Note:no change in H and P  07/25/2021 11:21 AM  Kiani D Debenedetto  has presented today for surgery, with the diagnosis of LEFT INGUINAL LYMPHADENOPATHY.  The various methods of treatment have been discussed with the patient and family. After consideration of risks, benefits and other options for treatment, the patient has consented to  Procedure(s): EXCISIONAL BIOPSY LEFT INGUINAL LYMPH NODES (Left) as a surgical intervention.  The patient's history has been reviewed, patient examined, no change in status, stable for surgery.  I have reviewed the patient's chart and labs.  Questions were answered to the patient's satisfaction.     Coralie Keens

## 2021-07-25 NOTE — Anesthesia Postprocedure Evaluation (Signed)
Anesthesia Post Note  Patient: Autumn Valenzuela  Procedure(s) Performed: EXCISIONAL BIOPSY LEFT INGUINAL LYMPH NODES (Left: Groin)     Patient location during evaluation: PACU Anesthesia Type: General Level of consciousness: awake Pain management: pain level controlled Vital Signs Assessment: post-procedure vital signs reviewed and stable Respiratory status: spontaneous breathing, nonlabored ventilation, respiratory function stable and patient connected to nasal cannula oxygen Cardiovascular status: blood pressure returned to baseline and stable Postop Assessment: no apparent nausea or vomiting Anesthetic complications: no   No notable events documented.  Last Vitals:  Vitals:   07/25/21 1245 07/25/21 1301  BP: 108/78 (!) 111/92  Pulse: 71 71  Resp: 17 18  Temp:  36.6 C  SpO2: 95% 94%    Last Pain:  Vitals:   07/25/21 1301  TempSrc: Oral  PainSc: 0-No pain                 Yakira Duquette P Leonetta Mcgivern

## 2021-07-26 ENCOUNTER — Encounter (HOSPITAL_BASED_OUTPATIENT_CLINIC_OR_DEPARTMENT_OTHER): Payer: Self-pay | Admitting: Surgery

## 2021-07-30 LAB — SURGICAL PATHOLOGY

## 2021-08-02 DIAGNOSIS — Z0100 Encounter for examination of eyes and vision without abnormal findings: Secondary | ICD-10-CM | POA: Diagnosis not present

## 2021-08-02 DIAGNOSIS — H04123 Dry eye syndrome of bilateral lacrimal glands: Secondary | ICD-10-CM | POA: Diagnosis not present

## 2021-08-02 DIAGNOSIS — E119 Type 2 diabetes mellitus without complications: Secondary | ICD-10-CM | POA: Diagnosis not present

## 2021-08-02 DIAGNOSIS — H10413 Chronic giant papillary conjunctivitis, bilateral: Secondary | ICD-10-CM | POA: Diagnosis not present

## 2021-08-02 DIAGNOSIS — H26491 Other secondary cataract, right eye: Secondary | ICD-10-CM | POA: Diagnosis not present

## 2021-08-02 DIAGNOSIS — H40013 Open angle with borderline findings, low risk, bilateral: Secondary | ICD-10-CM | POA: Diagnosis not present

## 2021-08-02 DIAGNOSIS — Z961 Presence of intraocular lens: Secondary | ICD-10-CM | POA: Diagnosis not present

## 2021-08-02 LAB — HM DIABETES EYE EXAM

## 2021-08-06 NOTE — Progress Notes (Unsigned)
PROVIDER NOTE: Information contained herein reflects review and annotations entered in association with encounter. Interpretation of such information and data should be left to medically-trained personnel. Information provided to patient can be located elsewhere in the medical record under "Patient Instructions". Document created using STT-dictation technology, any transcriptional errors that may result from process are unintentional.    Patient: Autumn Valenzuela  Service Category: E/M  Provider: Gaspar Cola, MD  DOB: November 05, 1960  DOS: 08/07/2021  Specialty: Interventional Pain Management  MRN: [id number]  Setting: Ambulatory outpatient  PCP: Flossie Buffy, NP  Type: Established Patient    Referring Provider: Flossie Buffy, NP  Location: Office  Delivery: Face-to-face     HPI  Autumn Valenzuela, a 61 y.o. year old female, is here today because of her No primary diagnosis found.. Autumn Valenzuela primary complain today is No chief complaint on file. Last encounter: My last encounter with her was on 07/24/2021. Pertinent problems: Autumn Valenzuela has Chronic daily headache; Radiculopathy; Chronic knee pain (4th area of Pain) (Bilateral) (L>R); Chronic migraine without aura, with intractable migraine, so stated, with status migrainosus; Diabetic neuropathy with neurologic complication ([place]); Claudication of both lower extremities ([place]); RLS (restless legs syndrome); Neck pain; Ulnar 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 of left upper extremity; Pain in thoracic spine; Chronic low back pain (1ry area of Pain) (Bilateral) (L>R) w/o sciatica; Pseudoarthrosis of lumbar spine; Chronic pain syndrome; Failed back surgical syndrome; History of lumbar fusion; Osteoarthritis of glenohumeral joint (Right); Osteoarthritis of AC (acromioclavicular) joint (Right); Lumbosacral facet arthropathy (Multilevel); History of lumbar laminectomy (L4-S1); Osteoarthritis of knee (Left); Chronic lower extremity pain (2ry area of Pain) (Bilateral)  (L>R); Chronic hip pain (3ry area of Pain) (Bilateral) (L>R); Chronic groin pain (Left); Impaired range of motion of hip; Abnormal CT scan, lumbar spine (10/11/2020); Lumbar facet syndrome; Spondylosis without myelopathy or radiculopathy, lumbosacral region; and DDD (degenerative disc disease), lumbosacral on their pertinent problem list. Pain Assessment: Severity of   is reported as a  /10. Location:    / . Onset:  . Quality:  . Timing:  . Modifying factor(s):  Marland Kitchen Vitals:  vitals were not taken for this visit.   Reason for encounter: post-procedure evaluation and assessment. ***  Post-procedure evaluation   Type: Lumbar Facet, Medial Branch Block(s) #2 (NO STEROIDS) Laterality: Bilateral  Level: L2, L3, L4, L5, & S1 Medial Branch Level(s). Injecting these levels blocks the L3-4, L4-5 and L5-S1 lumbar facet joints. Imaging: Fluoroscopic guidance Anesthesia: Local anesthesia (1-2% Lidocaine) Anxiolysis: IV Versed         Sedation: Moderate conscious sedation. DOS: 07/24/2021 Performed by: Gaspar Cola, MD  Primary Purpose: Diagnostic/Therapeutic Indications: Low back pain severe enough to impact quality of life or function. 1. Lumbar facet syndrome   2. Spondylosis without myelopathy or radiculopathy, lumbosacral region   3. Lumbosacral facet arthropathy (Multilevel)   4. Chronic low back pain (1ry area of Pain) (Bilateral) (L>R) w/o sciatica   5. DDD (degenerative disc disease), lumbosacral   6. Abnormal CT scan, lumbar spine (10/11/2020)    NAS-11 Pain score:   Pre-procedure: 10-Worst pain ever/10   Post-procedure: 0-No pain/10      Effectiveness:  Initial hour after procedure:   ***. Subsequent 4-6 hours post-procedure:   ***. Analgesia past initial 6 hours:   ***. Ongoing improvement:  Analgesic:  *** Function:    ***    ROM:    ***     Pharmacotherapy Assessment  Analgesic: Hydrocodone/APAP 5/325 1 tab p.o. twice  daily (last filled on 05/07/2021) (prescribed by  Leana Gamer, ANP) MME/day: 10 mg/day   Monitoring: [place] PMP: PDMP reviewed during this encounter.       Pharmacotherapy: No side-effects or adverse reactions reported. Compliance: No problems identified. Effectiveness: Clinically acceptable.  No notes on file  UDS:  No results found for: "SUMMARY"   ROS  Constitutional: Denies any fever or chills Gastrointestinal: No reported hemesis, hematochezia, vomiting, or acute GI distress Musculoskeletal: Denies any acute onset joint swelling, redness, loss of ROM, or weakness Neurological: No reported episodes of acute onset apraxia, aphasia, dysarthria, agnosia, amnesia, paralysis, loss of coordination, or loss of consciousness  Medication Review  Accu-Chek FastClix Lancets, Accu-Chek Guide Control, Basaglar KwikPen, DULoxetine, Dexcom G6 Sensor, Dexcom G6 Transmitter, Insulin Pen Needle, Semaglutide(0.25 or 0.5MG/DOS), Vitamin D3, albuterol, aspirin EC, blood glucose meter kit and supplies, buPROPion, diclofenac Sodium, empagliflozin, ferrous sulfate, fluticasone, gabapentin, glucose blood, insulin aspart, metFORMIN, oxyCODONE, and rosuvastatin  History Review  Allergy: Autumn Valenzuela has No Known Allergies. Drug: Autumn Valenzuela  reports that she does not currently use drugs after having used the following drugs: Marijuana. Alcohol:  reports that she does not currently use alcohol. Tobacco:  reports that she quit smoking about 5 years ago. Her smoking use included cigarettes. She has never used smokeless tobacco. Social: Autumn Valenzuela  reports that she quit smoking about 5 years ago. Her smoking use included cigarettes. She has never used smokeless tobacco. She reports that she does not currently use alcohol. She reports that she does not currently use drugs after having used the following drugs: Marijuana. Medical:  has a past medical history of Allergy, Anemia, Anxiety, Arthritis, Asthma, Back pain, Bipolar disorder ([place]), CVA (cerebral infarction),  Depression, Diabetes mellitus without complication ([place]), High cholesterol, Hypertension, Left leg pain, Migraine, Sleep apnea, and Stroke ([place]) (2007). Surgical: Autumn Valenzuela  has a past surgical history that includes Back surgery; Partial hysterectomy; Shoulder surgery; Knee surgery; RADICULOPATHY (Left); NEUROFORAMINAL STENOSIS (Left); Eye surgery (Bilateral); DIABETES; Anterior lateral lumbar fusion with percutaneous screw 1 level (Left, 02/11/2018); l 4-l5 status post lateral posterior fusion January 30,2020; Abdominal hysterectomy; ABDOMINAL AORTOGRAM W/LOWER EXTREMITY (N/A, 02/08/2021); Endarterectomy femoral (Left, 02/12/2021); and Inguinal lymph node biopsy (Left, 07/25/2021). Family: family history includes Cancer in her paternal aunt and paternal aunt; Diabetes in her brother and mother; Stroke in her father.  Laboratory Chemistry Profile   Renal Lab Results  Component Value Date   BUN 9 07/25/2021   CREATININE 0.73 07/25/2021   GFR 95.70 01/17/2021   GFRAA >60 02/09/2018   GFRNONAA >60 07/25/2021    Hepatic Lab Results  Component Value Date   AST 10 (L) 02/12/2021   ALT 10 02/12/2021   ALBUMIN 3.4 (L) 02/12/2021   ALKPHOS 57 02/12/2021   LIPASE 58 11/11/2013    Electrolytes Lab Results  Component Value Date   NA 143 07/25/2021   K 3.7 07/25/2021   CL 109 07/25/2021   CALCIUM 9.2 07/25/2021   MG 1.9 10/07/2018    Bone Lab Results  Component Value Date   VD25OH 31.23 01/17/2021    Inflammation (CRP: Acute Phase) (ESR: Chronic Phase) Lab Results  Component Value Date   CRP 14.6 (H) 07/21/2020   ESRSEDRATE 34 (H) 07/21/2020         Note: Above Lab results reviewed.  Recent Imaging Review  DG PAIN CLINIC C-ARM 1-60 MIN NO REPORT Fluoro was used, but no Radiologist interpretation will be provided.  Please refer to "NOTES" tab for  provider progress note. Note: Reviewed        Physical Exam  General appearance: Well nourished, well developed, and well hydrated. In  no apparent acute distress Mental status: Alert, oriented x 3 (person, place, & time)       Respiratory: No evidence of acute respiratory distress Eyes: PERLA Vitals: There were no vitals taken for this visit. BMI: Estimated body mass index is 37.24 kg/m as calculated from the following:   Height as of 07/25/21: '5\' 1"'  (1.549 m).   Weight as of 07/25/21: 197 lb 1.5 oz (89.4 kg). Ideal: Ideal body weight: 47.8 kg (105 lb 6.1 oz) Adjusted ideal body weight: 64.4 kg (142 lb 1 oz)  Assessment   Diagnosis Status  No diagnosis found. Controlled Controlled Controlled   Updated Problems: No problems updated.  Plan of Care  Problem-specific:  No problem-specific Assessment & Plan notes found for this encounter.  Autumn Valenzuela has a current medication list which includes the following long-term medication(s): albuterol, albuterol, bupropion, duloxetine, ferrous sulfate, fluticasone, gabapentin, novolog flexpen, basaglar kwikpen, metformin, onetouch verio, and rosuvastatin.  Pharmacotherapy (Medications Ordered): No orders of the defined types were placed in this encounter.  Orders:  No orders of the defined types were placed in this encounter.  Follow-up plan:   No follow-ups on file.     Interventional Therapies  Risk  Complexity Considerations:   Estimated body mass index is 38.55 kg/m as calculated from the following:   Height as of this encounter: '5\' 1"'  (1.549 m).   Weight as of this encounter: 204 lb (92.5 kg). IDDMM w/ complications & Hx of ketoacidosis PVD      Planned  Pending:   Diagnostic bilateral lumbar facet MBB #2 (without steroids)    Under consideration:   Diagnostic bilateral lumbar facet MBB #2 (without steroids)  Diagnostic bilateral IA hip joint inj. #1  Diagnostic right IA knee joint inj. #1  Diagnostic left genicular NB #1    Completed:   Diagnostic bilateral lumbar facet MBB x1 (without steroids) (06/26/2021) (100/100/50)    Therapeutic   Palliative (PRN) options:   None established        Recent Visits Date Type Provider Dept  07/24/21 Procedure visit Milinda Pointer, MD [facility]  07/12/21 Office Visit Milinda Pointer, MD [facility]  06/26/21 Procedure visit Milinda Pointer, MD [facility]  05/14/21 Office Visit Milinda Pointer, MD [facility]  Showing recent visits within past 90 days and meeting all other requirements Future Appointments Date Type Provider Dept  08/07/21 Appointment Milinda Pointer, MD [facility]  Showing future appointments within next 90 days and meeting all other requirements  I discussed the assessment and treatment plan with the patient. The patient was provided an opportunity to ask questions and all were answered. The patient agreed with the plan and demonstrated an understanding of the instructions.  Patient advised to call back or seek an in-person evaluation if the symptoms or condition worsens.  Duration of encounter: *** minutes.  Total time on encounter, as per AMA guidelines included both the face-to-face and non-face-to-face time personally spent by the physician and/or other qualified health care professional(s) on the day of the encounter (includes time in activities that require the physician or other qualified health care professional and does not include time in activities normally performed by clinical staff). Physician's time may include the following activities when performed: preparing to see the patient (eg, review of tests, pre-charting review of  records) obtaining and/or reviewing separately obtained history performing a medically appropriate examination and/or evaluation counseling and educating the patient/family/caregiver ordering medications, tests, or procedures referring and communicating with other health care professionals (when not separately reported) documenting clinical information in the  electronic or other health record independently interpreting results (not separately reported) and communicating results to the patient/ family/caregiver care coordination (not separately reported)  Note by: Gaspar Cola, MD Date: 08/07/2021; Time: 7:28 AM

## 2021-08-07 ENCOUNTER — Ambulatory Visit: Payer: Medicare HMO | Attending: Pain Medicine | Admitting: Pain Medicine

## 2021-08-07 ENCOUNTER — Encounter: Payer: Self-pay | Admitting: Pain Medicine

## 2021-08-07 VITALS — BP 115/85 | HR 95 | Temp 97.9°F | Ht 61.0 in | Wt 197.0 lb

## 2021-08-07 DIAGNOSIS — M545 Low back pain, unspecified: Secondary | ICD-10-CM | POA: Diagnosis not present

## 2021-08-07 DIAGNOSIS — M47816 Spondylosis without myelopathy or radiculopathy, lumbar region: Secondary | ICD-10-CM | POA: Diagnosis not present

## 2021-08-07 DIAGNOSIS — G8929 Other chronic pain: Secondary | ICD-10-CM | POA: Diagnosis not present

## 2021-08-07 DIAGNOSIS — M25552 Pain in left hip: Secondary | ICD-10-CM

## 2021-08-07 DIAGNOSIS — M961 Postlaminectomy syndrome, not elsewhere classified: Secondary | ICD-10-CM

## 2021-08-07 DIAGNOSIS — M25551 Pain in right hip: Secondary | ICD-10-CM | POA: Diagnosis not present

## 2021-08-07 NOTE — Progress Notes (Signed)
Safety precautions to be maintained throughout the outpatient stay will include: orient to surroundings, keep bed in low position, maintain call bell within reach at all times, provide assistance with transfer out of bed and ambulation.  

## 2021-08-20 DIAGNOSIS — E1142 Type 2 diabetes mellitus with diabetic polyneuropathy: Secondary | ICD-10-CM | POA: Diagnosis not present

## 2021-08-20 DIAGNOSIS — E1165 Type 2 diabetes mellitus with hyperglycemia: Secondary | ICD-10-CM | POA: Diagnosis not present

## 2021-08-20 DIAGNOSIS — M778 Other enthesopathies, not elsewhere classified: Secondary | ICD-10-CM | POA: Diagnosis not present

## 2021-08-26 NOTE — Progress Notes (Unsigned)
PROVIDER NOTE: Interpretation of information contained herein should be left to medically-trained personnel. Specific patient instructions are provided elsewhere under "Patient Instructions" section of medical record. This document was created in part using STT-dictation technology, any transcriptional errors that may result from this process are unintentional.  Patient: Autumn Valenzuela Type: Established DOB: 07/23/1960 MRN: [id number] PCP: Flossie Buffy, NP  Service: Procedure DOS: 08/28/2021 Setting: Ambulatory Location: Ambulatory outpatient facility Delivery: Face-to-face Provider: Gaspar Cola, MD Specialty: Interventional Pain Management Specialty designation: 09 Location: Outpatient facility Ref. Prov.: Milinda Pointer, MD    Procedure:           Type: Lumbar Facet, Medial Branch Radiofrequency Ablation (RFA) #1  Laterality: Left (-LT)  Level: L2, L3, L4, L5, & S1 Medial Branch Level(s). These levels will denervate the L3-4, L4-5 and L5-S1 lumbar facet joints.  Imaging: Fluoroscopy-guided         Anesthesia: Local anesthesia (1-2% Lidocaine) Anxiolysis: IV Versed         Sedation: Moderate Sedation              .  DOS: 08/28/2021  Performed by: Gaspar Cola, MD  Purpose: Therapeutic/Palliative Indications: Low back pain severe enough to impact quality of life or function. Indications: 1. Lumbar facet syndrome   2. Spondylosis without myelopathy or radiculopathy, lumbosacral region   3. DDD (degenerative disc disease), lumbosacral   4. Lumbosacral facet arthropathy (Multilevel)   5. Chronic low back pain (1ry area of Pain) (Bilateral) (L>R) w/o sciatica   6. Severe obesity (BMI 35.0-39.9) with comorbidity [facility])    Ms. Howells has been dealing with the above chronic pain for longer than three months and has either failed to respond, was unable to tolerate, or simply did not get enough benefit from other more conservative therapies including, but not limited  to: 1. Over-the-counter medications 2. Anti-inflammatory medications 3. Muscle relaxants 4. Membrane stabilizers 5. Opioids 6. Physical therapy and/or chiropractic manipulation 7. Modalities (Heat, ice, etc.) 8. Invasive techniques such as nerve blocks. Ms. Mccrackin has attained more than 50% relief of the pain from a series of diagnostic injections conducted in separate occasions.  Pain Score: Pre-procedure: 5 /10 Post-procedure: 0-No pain/10     Position / Prep / Materials:  Position: Prone  Prep solution: DuraPrep (Iodine Povacrylex [0.7% available iodine] and Isopropyl Alcohol, 74% w/w) Prep Area: Entire Lumbosacral Region (Lower back from mid-thoracic region to end of tailbone and from flank to flank.) Materials:  Tray: RFA (Radiofrequency) tray Needle(s):  Type: RFA (Teflon-coated radiofrequency ablation needles) Gauge (G): 20  Length: Long (15cm) Qty: 5  Pre-op H&P Assessment:  Ms. Melchior is a 61 y.o. (year old), female patient, seen today for interventional treatment. She  has a past surgical history that includes Back surgery; Partial hysterectomy; Shoulder surgery; Knee surgery; RADICULOPATHY (Left); NEUROFORAMINAL STENOSIS (Left); Eye surgery (Bilateral); DIABETES; Anterior lateral lumbar fusion with percutaneous screw 1 level (Left, 02/11/2018); l 4-l5 status post lateral posterior fusion January 30,2020; Abdominal hysterectomy; ABDOMINAL AORTOGRAM W/LOWER EXTREMITY (N/A, 02/08/2021); Endarterectomy femoral (Left, 02/12/2021); and Inguinal lymph node biopsy (Left, 07/25/2021). Ms. Kolinski has a current medication list which includes the following prescription(s): accu-chek fastclix lancets, albuterol, albuterol, aspirin ec, accu-chek guide control, blood glucose meter kit and supplies, bupropion, vitamin d3, dexcom g6 sensor, dexcom g6 transmitter, diclofenac sodium, duloxetine, empagliflozin, ferrous sulfate, fluticasone, gabapentin, hydrocodone-acetaminophen, [START ON 09/04/2021]  hydrocodone-acetaminophen, novolog flexpen, basaglar kwikpen, insulin pen needle, metformin, onetouch verio, oxycodone, rosuvastatin, and ozempic (0.25 or 0.5 mg/dose),  and the following Facility-Administered Medications: fentanyl and lactated ringers. Her primarily concern today is the Back Pain  Initial Vital Signs:  Pulse/HCG Rate:  ECG Heart Rate: 82 (NSR) Temp: (!) 97.3 F (36.3 C) Resp: 18 BP: 120/88 SpO2: 98 %  BMI: Estimated body mass index is 36.28 kg/m as calculated from the following:   Height as of this encounter: $RemoveBeforeD'5\' 1"'MZrJSrekNfybbv$  (1.549 m).   Weight as of this encounter: 192 lb (87.1 kg).  Risk Assessment: Allergies: Reviewed. She has No Known Allergies.  Allergy Precautions: None required Coagulopathies: Reviewed. None identified.  Blood-thinner therapy: None at this time Active Infection(s): Reviewed. None identified. Ms. Viele is afebrile  Site Confirmation: Ms. Bilger was asked to confirm the procedure and laterality before marking the site Procedure checklist: Completed Consent: Before the procedure and under the influence of no sedative(s), amnesic(s), or anxiolytics, the patient was informed of the treatment options, risks and possible complications. To fulfill our ethical and legal obligations, as recommended by the American Medical Association's Code of Ethics, I have informed the patient of my clinical impression; the nature and purpose of the treatment or procedure; the risks, benefits, and possible complications of the intervention; the alternatives, including doing nothing; the risk(s) and benefit(s) of the alternative treatment(s) or procedure(s); and the risk(s) and benefit(s) of doing nothing. The patient was provided information about the general risks and possible complications associated with the procedure. These may include, but are not limited to: failure to achieve desired goals, infection, bleeding, organ or nerve damage, allergic reactions, paralysis, and  death. In addition, the patient was informed of those risks and complications associated to Spine-related procedures, such as failure to decrease pain; infection (i.e.: Meningitis, epidural or intraspinal abscess); bleeding (i.e.: epidural hematoma, subarachnoid hemorrhage, or any other type of intraspinal or peri-dural bleeding); organ or nerve damage (i.e.: Any type of peripheral nerve, nerve root, or spinal cord injury) with subsequent damage to sensory, motor, and/or autonomic systems, resulting in permanent pain, numbness, and/or weakness of one or several areas of the body; allergic reactions; (i.e.: anaphylactic reaction); and/or death. Furthermore, the patient was informed of those risks and complications associated with the medications. These include, but are not limited to: allergic reactions (i.e.: anaphylactic or anaphylactoid reaction(s)); adrenal axis suppression; blood sugar elevation that in diabetics may result in ketoacidosis or comma; water retention that in patients with history of congestive heart failure may result in shortness of breath, pulmonary edema, and decompensation with resultant heart failure; weight gain; swelling or edema; medication-induced neural toxicity; particulate matter embolism and blood vessel occlusion with resultant organ, and/or nervous system infarction; and/or aseptic necrosis of one or more joints. Finally, the patient was informed that Medicine is not an exact science; therefore, there is also the possibility of unforeseen or unpredictable risks and/or possible complications that may result in a catastrophic outcome. The patient indicated having understood very clearly. We have given the patient no guarantees and we have made no promises. Enough time was given to the patient to ask questions, all of which were answered to the patient's satisfaction. Ms. Kunka has indicated that she wanted to continue with the procedure. Attestation: I, the ordering provider,  attest that I have discussed with the patient the benefits, risks, side-effects, alternatives, likelihood of achieving goals, and potential problems during recovery for the procedure that I have provided informed consent. Date  Time: 08/28/2021  8:10 AM  Pre-Procedure Preparation:  Monitoring: As per clinic protocol. Respiration, ETCO2, SpO2, BP, heart rate and  rhythm monitor placed and checked for adequate function Safety Precautions: Patient was assessed for positional comfort and pressure points before starting the procedure. Time-out: I initiated and conducted the "Time-out" before starting the procedure, as per protocol. The patient was asked to participate by confirming the accuracy of the "Time Out" information. Verification of the correct person, site, and procedure were performed and confirmed by me, the nursing staff, and the patient. "Time-out" conducted as per Joint Commission's Universal Protocol (UP.01.01.01). Time: 65  Description of Procedure:          Laterality: Left Levels:  L2, L3, L4, L5, & S1 Medial Branch Level(s). Safety Precautions: Aspiration looking for blood return was conducted prior to all injections. At no point did we inject any substances, as a needle was being advanced. Before injecting, the patient was told to immediately notify me if she was experiencing any new onset of "ringing in the ears, or metallic taste in the mouth". No attempts were made at seeking any paresthesias. Safe injection practices and needle disposal techniques used. Medications properly checked for expiration dates. SDV (single dose vial) medications used. After the completion of the procedure, all disposable equipment used was discarded in the proper designated medical waste containers. Local Anesthesia: Protocol guidelines were followed. The patient was positioned over the fluoroscopy table. The area was prepped in the usual manner. The time-out was completed. The target area was identified  using fluoroscopy. A 12-in long, straight, sterile hemostat was used with fluoroscopic guidance to locate the targets for each level blocked. Once located, the skin was marked with an approved surgical skin marker. Once all sites were marked, the skin (epidermis, dermis, and hypodermis), as well as deeper tissues (fat, connective tissue and muscle) were infiltrated with a small amount of a short-acting local anesthetic, loaded on a 10cc syringe with a 25G, 1.5-in  Needle. An appropriate amount of time was allowed for local anesthetics to take effect before proceeding to the next step. Technical description of process:  Radiofrequency Ablation (RFA) L2 Medial Branch Nerve RFA: The target area for the L2 medial branch is at the junction of the postero-lateral aspect of the superior articular process and the superior, posterior, and medial edge of the transverse process of L3. Under fluoroscopic guidance, a Radiofrequency needle was inserted until contact was made with os over the superior postero-lateral aspect of the pedicular shadow (target area). Sensory and motor testing was conducted to properly adjust the position of the needle. Once satisfactory placement of the needle was achieved, the numbing solution was slowly injected after negative aspiration for blood. 2.0 mL of the nerve block solution was injected without difficulty or complication. After waiting for at least 3 minutes, the ablation was performed. Once completed, the needle was removed intact. L3 Medial Branch Nerve RFA: The target area for the L3 medial branch is at the junction of the postero-lateral aspect of the superior articular process and the superior, posterior, and medial edge of the transverse process of L4. Under fluoroscopic guidance, a Radiofrequency needle was inserted until contact was made with os over the superior postero-lateral aspect of the pedicular shadow (target area). Sensory and motor testing was conducted to properly  adjust the position of the needle. Once satisfactory placement of the needle was achieved, the numbing solution was slowly injected after negative aspiration for blood. 2.0 mL of the nerve block solution was injected without difficulty or complication. After waiting for at least 3 minutes, the ablation was performed. Once completed, the needle  was removed intact. L4 Medial Branch Nerve RFA: The target area for the L4 medial branch is at the junction of the postero-lateral aspect of the superior articular process and the superior, posterior, and medial edge of the transverse process of L5. Under fluoroscopic guidance, a Radiofrequency needle was inserted until contact was made with os over the superior postero-lateral aspect of the pedicular shadow (target area). Sensory and motor testing was conducted to properly adjust the position of the needle. Once satisfactory placement of the needle was achieved, the numbing solution was slowly injected after negative aspiration for blood. 2.0 mL of the nerve block solution was injected without difficulty or complication. After waiting for at least 3 minutes, the ablation was performed. Once completed, the needle was removed intact. L5 Medial Branch Nerve RFA: The target area for the L5 medial branch is at the junction of the postero-lateral aspect of the superior articular process of S1 and the superior, posterior, and medial edge of the sacral ala. Under fluoroscopic guidance, a Radiofrequency needle was inserted until contact was made with os over the superior postero-lateral aspect of the pedicular shadow (target area). Sensory and motor testing was conducted to properly adjust the position of the needle. Once satisfactory placement of the needle was achieved, the numbing solution was slowly injected after negative aspiration for blood. 2.0 mL of the nerve block solution was injected without difficulty or complication. After waiting for at least 3 minutes, the ablation  was performed. Once completed, the needle was removed intact. S1 Medial Branch Nerve RFA: The target area for the S1 medial branch is located inferior to the junction of the S1 superior articular process and the L5 inferior articular process, posterior, inferior, and lateral to the 6 o'clock position of the L5-S1 facet joint, just superior to the S1 posterior foramen. Under fluoroscopic guidance, the Radiofrequency needle was advanced until contact was made with os over the Target area. Sensory and motor testing was conducted to properly adjust the position of the needle. Once satisfactory placement of the needle was achieved, the numbing solution was slowly injected after negative aspiration for blood. 2.0 mL of the nerve block solution was injected without difficulty or complication. After waiting for at least 3 minutes, the ablation was performed. Once completed, the needle was removed intact. Radiofrequency lesioning (ablation):  Radiofrequency Generator: Medtronic AccurianTM AG 1000 RF Generator Sensory Stimulation Parameters: 50 Hz was used to locate & identify the nerve, making sure that the needle was positioned such that there was no sensory stimulation below 0.3 V or above 0.7 V. Motor Stimulation Parameters: 2 Hz was used to evaluate the motor component. Care was taken not to lesion any nerves that demonstrated motor stimulation of the lower extremities at an output of less than 2.5 times that of the sensory threshold, or a maximum of 2.0 V. Lesioning Technique Parameters: Standard Radiofrequency settings. (Not bipolar or pulsed.) Temperature Settings: 80 degrees C Lesioning time: 60 seconds Intra-operative Compliance: Compliant  Once the entire procedure was completed, the treated area was cleaned, making sure to leave some of the prepping solution back to take advantage of its long term bactericidal properties.    Illustration of the posterior view of the lumbar spine and the posterior  neural structures. Laminae of L2 through S1 are labeled. DPRL5, dorsal primary ramus of L5; DPRS1, dorsal primary ramus of S1; DPR3, dorsal primary ramus of L3; FJ, facet (zygapophyseal) joint L3-L4; I, inferior articular process of L4; LB1, lateral branch of dorsal primary  ramus of L1; IAB, inferior articular branches from L3 medial branch (supplies L4-L5 facet joint); IBP, intermediate branch plexus; MB3, medial branch of dorsal primary ramus of L3; NR3, third lumbar nerve root; S, superior articular process of L5; SAB, superior articular branches from L4 (supplies L4-5 facet joint also); TP3, transverse process of L3.  Vitals:   08/28/21 0929 08/28/21 0940 08/28/21 0949 08/28/21 0958  BP: (!) 130/91 1$RemoveBef'13/71 96/66 90/68 'HKOKrehbJn$  Resp: $Remo'18 17 20 19  'ksjJP$ Temp:      SpO2: 96% 100% 100% 99%  Weight:      Height:        Start Time: 0840 hrs. End Time: 0929 hrs.  Imaging Guidance (Spinal):          Type of Imaging Technique: Fluoroscopy Guidance (Spinal) Indication(s): Assistance in needle guidance and placement for procedures requiring needle placement in or near specific anatomical locations not easily accessible without such assistance. Exposure Time: Please see nurses notes. Contrast: None used. Fluoroscopic Guidance: I was personally present during the use of fluoroscopy. "Tunnel Vision Technique" used to obtain the best possible view of the target area. Parallax error corrected before commencing the procedure. "Direction-depth-direction" technique used to introduce the needle under continuous pulsed fluoroscopy. Once target was reached, antero-posterior, oblique, and lateral fluoroscopic projection used confirm needle placement in all planes. Images permanently stored in EMR. Interpretation: No contrast injected. I personally interpreted the imaging intraoperatively. Adequate needle placement confirmed in multiple planes. Permanent images saved into the patient's record.  Antibiotic Prophylaxis:    Anti-infectives (From admission, onward)    None      Indication(s): None identified  Post-operative Assessment:  Post-procedure Vital Signs:  Pulse/HCG Rate:  81 Temp: (!) 97.3 F (36.3 C) Resp: 19 BP: 90/68 SpO2: 99 %  EBL: None  Complications: No immediate post-treatment complications observed by team, or reported by patient.  Note: The patient tolerated the entire procedure well. A repeat set of vitals were taken after the procedure and the patient was kept under observation following institutional policy, for this type of procedure. Post-procedural neurological assessment was performed, showing return to baseline, prior to discharge. The patient was provided with post-procedure discharge instructions, including a section on how to identify potential problems. Should any problems arise concerning this procedure, the patient was given instructions to immediately contact us, at any time, without hesitation. In any case, we plan to contact the patient by telephone for a follow-up status report regarding this interventional procedure.  Comments:  No additional relevant information.  Plan of Care  Orders:  Orders Placed This Encounter  Procedures   Radiofrequency,Lumbar    Scheduling Instructions:     Side(s): Left-sided     Level: L3-4, L4-5, & L5-S1 Facets (L2, L3, L4, L5, & S1 Medial Branch Nerves)     Sedation: With Sedation.     Timeframe: Today    Order Specific Question:   Where will this procedure be performed?    Answer:   [facility]   Radiofrequency,Lumbar    Standing Status:   Future    Standing Expiration Date:   11/28/2021    Scheduling Instructions:     Side(s): Right-sided     Level: L3-4, L4-5, & L5-S1 Facets (L2, L3, L4, L5, & S1 Medial Branch Nerves)     Sedation: With Sedation.     Scheduling Timeframe: 2 weeks from now    Order Specific Question:   Where will this procedure be performed?    Answer:  [facility]   DG PAIN CLINIC  C-ARM 1-60 MIN NO REPORT    Intraoperative interpretation by procedural physician at [facility].    Standing Status:   Standing    Number of Occurrences:   1    Order Specific Question:   Reason for exam:    Answer:   Assistance in needle guidance and placement for procedures requiring needle placement in or near specific anatomical locations not easily accessible without such assistance.   Informed Consent Details: Physician/Practitioner Attestation; Transcribe to consent form and obtain patient signature    Nursing Order: Transcribe to consent form and obtain patient signature. Note: Always confirm laterality of pain with Ms. Lamore, before procedure.    Order Specific Question:   Physician/Practitioner attestation of informed consent for procedure/surgical case    Answer:   I, the physician/practitioner, attest that I have discussed with the patient the benefits, risks, side effects, alternatives, likelihood of achieving goals and potential problems during recovery for the procedure that I have provided informed consent.    Order Specific Question:   Procedure    Answer:   Lumbar Facet Radiofrequency Ablation    Order Specific Question:   Physician/Practitioner performing the procedure    Answer:   Kijana Cromie A. Dossie Arbour, MD    Order Specific Question:   Indication/Reason    Answer:   Low Back Pain, with our without leg pain, due to Facet Joint Arthralgia (Joint Pain) known as Lumbar Facet Syndrome, secondary to Lumbar, and/or Lumbosacral Spondylosis (Arthritis of the Spine), without myelopathy or radiculopathy (Nerve Damage).   Provide equipment / supplies at bedside    "Radiofrequency Tray"; Large hemostat (x1); Small hemostat (x1); Towels (x8); 4x4 sterile sponge pack (x1) Needle type: Teflon-coated Radiofrequency Needle (Disposable  single use) Size: Long Quantity: 5    Standing Status:   Standing    Number of Occurrences:   1    Order Specific Question:   Specify     Answer:   Radiofrequency Tray   Chronic Opioid Analgesic:  Hydrocodone/APAP 5/325 1 tab p.o. twice daily (last filled on 05/07/2021) (prescribed by Charyl Bigger Nche, ANP) MME/day: 10 mg/day   Medications ordered for procedure: Meds ordered this encounter  Medications   lidocaine (XYLOCAINE) 2 % (with pres) injection 400 mg   lactated ringers infusion   midazolam (VERSED) 5 MG/5ML injection 0.5-2 mg    Make sure Flumazenil is available in the pyxis when using this medication. If oversedation occurs, administer 0.2 mg IV over 15 sec. If after 45 sec no response, administer 0.2 mg again over 1 min; may repeat at 1 min intervals; not to exceed 4 doses (1 mg)   fentaNYL (SUBLIMAZE) injection 25-50 mcg    Make sure Narcan is available in the pyxis when using this medication. In the event of respiratory depression (RR< 8/min): Titrate NARCAN (naloxone) in increments of 0.1 to 0.2 mg IV at 2-3 minute intervals, until desired degree of reversal.   ropivacaine (PF) 2 mg/mL (0.2%) (NAROPIN) injection 9 mL   HYDROcodone-acetaminophen (NORCO/VICODIN) 5-325 MG tablet    Sig: Take 1 tablet by mouth every 8 (eight) hours as needed for up to 7 days for severe pain. Must last 7 days.    Dispense:  21 tablet    Refill:  0    For acute post-operative pain. Not to be refilled. Must last 7 days.   HYDROcodone-acetaminophen (NORCO/VICODIN) 5-325 MG tablet    Sig: Take 1 tablet by mouth every 8 (  eight) hours as needed for up to 7 days for severe pain. Must last for 7 days.    Dispense:  21 tablet    Refill:  0    For acute post-operative pain. Not to be refilled. Must last 7 days.   Medications administered: We administered lidocaine, midazolam, fentaNYL, and ropivacaine (PF) 2 mg/mL (0.2%).  See the medical record for exact dosing, route, and time of administration.  Follow-up plan:   Return in about 2 weeks (around 09/11/2021) for (78min), procedure (ECT): (R) L-FCT RFA #1 (NO STEROIDS), & (PPE).        Interventional Therapies  Risk  Complexity Considerations:   Estimated body mass index is 38.55 kg/m as calculated from the following:   Height as of this encounter: $RemoveBeforeD'5\' 1"'HCpsNdAthEhSgo$  (1.549 m).   Weight as of this encounter: 204 lb (92.5 kg). IDDMM w/ complications & Hx of ketoacidosis PVD      Planned  Pending:   Diagnostic bilateral lumbar facet MBB #2 (without steroids)    Under consideration:   Diagnostic bilateral lumbar facet MBB #2 (without steroids)  Diagnostic bilateral IA hip joint inj. #1  Diagnostic right IA knee joint inj. #1  Diagnostic left genicular NB #1    Completed:   Diagnostic bilateral lumbar facet MBB x1 (without steroids) (06/26/2021) (100/100/50)    Therapeutic  Palliative (PRN) options:   None established         Recent Visits Date Type Provider Dept  08/07/21 Office Visit Milinda Pointer, MD [facility]  07/24/21 Procedure visit Milinda Pointer, MD [facility]  07/12/21 Office Visit Milinda Pointer, MD [facility]  06/26/21 Procedure visit Milinda Pointer, MD [facility]  Showing recent visits within past 90 days and meeting all other requirements Today's Visits Date Type Provider Dept  08/28/21 Procedure visit Milinda Pointer, MD [facility]  Showing today's visits and meeting all other requirements Future Appointments No visits were found meeting these conditions. Showing future appointments within next 90 days and meeting all other requirements  Disposition: Discharge home  Discharge (Date  Time): 08/28/2021; 1000 hrs.   Primary Care Physician: Flossie Buffy, NP Location: [facility] Note by: Gaspar Cola, MD Date: 08/28/2021; Time: 12:10 PM  Disclaimer:  Medicine is not an Chief Strategy Officer. The only guarantee in medicine is that nothing is guaranteed. It is important to note that the decision to proceed with this intervention was based on  the information collected from the patient. The Data and conclusions were drawn from the patient's questionnaire, the interview, and the physical examination. Because the information was provided in large part by the patient, it cannot be guaranteed that it has not been purposely or unconsciously manipulated. Every effort has been made to obtain as much relevant data as possible for this evaluation. It is important to note that the conclusions that lead to this procedure are derived in large part from the available data. Always take into account that the treatment will also be dependent on availability of resources and existing treatment guidelines, considered by other Pain Management Practitioners as being common knowledge and practice, at the time of the intervention. For Medico-Legal purposes, it is also important to point out that variation in procedural techniques and pharmacological choices are the acceptable norm. The indications, contraindications, technique, and results of the above procedure should only be interpreted and judged by a Board-Certified Interventional Pain Specialist with extensive familiarity and expertise in the same exact procedure  and technique.

## 2021-08-27 ENCOUNTER — Ambulatory Visit (INDEPENDENT_AMBULATORY_CARE_PROVIDER_SITE_OTHER): Payer: Medicare HMO | Admitting: Nurse Practitioner

## 2021-08-27 ENCOUNTER — Ambulatory Visit (INDEPENDENT_AMBULATORY_CARE_PROVIDER_SITE_OTHER): Payer: Medicare HMO

## 2021-08-27 ENCOUNTER — Encounter: Payer: Self-pay | Admitting: Nurse Practitioner

## 2021-08-27 VITALS — BP 98/62 | HR 68 | Temp 96.9°F | Ht 61.0 in | Wt 192.2 lb

## 2021-08-27 DIAGNOSIS — M898X5 Other specified disorders of bone, thigh: Secondary | ICD-10-CM | POA: Diagnosis not present

## 2021-08-27 DIAGNOSIS — M79652 Pain in left thigh: Secondary | ICD-10-CM | POA: Diagnosis not present

## 2021-08-27 DIAGNOSIS — F3175 Bipolar disorder, in partial remission, most recent episode depressed: Secondary | ICD-10-CM | POA: Diagnosis not present

## 2021-08-27 DIAGNOSIS — R69 Illness, unspecified: Secondary | ICD-10-CM | POA: Diagnosis not present

## 2021-08-27 DIAGNOSIS — E782 Mixed hyperlipidemia: Secondary | ICD-10-CM

## 2021-08-27 DIAGNOSIS — M1612 Unilateral primary osteoarthritis, left hip: Secondary | ICD-10-CM | POA: Diagnosis not present

## 2021-08-27 LAB — HEPATIC FUNCTION PANEL
ALT: 8 U/L (ref 0–35)
AST: 11 U/L (ref 0–37)
Albumin: 4.2 g/dL (ref 3.5–5.2)
Alkaline Phosphatase: 59 U/L (ref 39–117)
Bilirubin, Direct: 0.1 mg/dL (ref 0.0–0.3)
Total Bilirubin: 0.3 mg/dL (ref 0.2–1.2)
Total Protein: 7.4 g/dL (ref 6.0–8.3)

## 2021-08-27 LAB — LDL CHOLESTEROL, DIRECT: Direct LDL: 123 mg/dL

## 2021-08-27 NOTE — Progress Notes (Unsigned)
Established Patient Visit  Patient: Autumn Valenzuela   DOB: 1960/12/02   61 y.o. Female  MRN: [id number] Visit Date: 08/30/2021  Subjective:    Chief Complaint  Patient presents with   Office Visit    HTN/ Hyperlipidemia, Depression f/u Checks BP daily C/o Left inner thigh pain x 3 months     Leg Pain  The incident occurred more than 1 week ago. The incident occurred at home. The injury mechanism was a direct blow (grandson (58yr) jumped on upper leg). The pain is present in the left thigh. The quality of the pain is described as aching. The pain has been Constant since onset. Pertinent negatives include no inability to bear weight, loss of motion, loss of sensation, muscle weakness, numbness or tingling. The symptoms are aggravated by palpation. She has tried nothing for the symptoms.   [place] Despite high PHQ and GAD score, she decline need for psychology and/or psychiatry referral. Denies any hallucinations or SI.  Maintain current med doses  HLD (hyperlipidemia) Repeat direct LDL Maintain crestor dose     08/27/2021   11:35 AM 06/27/2021   10:40 AM 05/30/2021   10:05 AM  Depression screen PHQ 2/9  Decreased Interest 1 1 0  Down, Depressed, Hopeless _0 PHQ - 2 Score _1 Altered sleeping _2 Tired, decreased energy _3 Change in appetite _4 Feeling bad or failure about yourself  _5 Trouble concentrating _6 Moving slowly or fidgety/restless _7 Suicidal thoughts _8 PHQ-9 Score _9 Difficult doing work/chores Somewhat difficult Somewhat difficult Somewhat difficult       08/27/2021   11:35 AM 06/27/2021   10:41 AM 05/30/2021   10:05 AM 05/02/2021   10:17 AM  GAD 7 : Generalized Anxiety Score  Nervous, Anxious, on Edge _10 Control/stop worrying _11 Worry too much - different things _12 Trouble relaxing _13 Restless _14 Easily annoyed or irritable _15 Afraid - awful might  happen 0 _16 Total GAD 7 Score _17 Anxiety Difficulty Somewhat difficult Somewhat difficult Somewhat difficult Somewhat difficult     Reviewed medical, surgical, and social history today  Medications: Outpatient Medications Prior to Visit  Medication Sig   Accu-Chek FastClix Lancets MISC TEST 4 TIMES DAILY. DX E11.9   albuterol (PROVENTIL) (2.5 MG/3ML) 0.083% nebulizer solution Take 2.5 mg by nebulization every 6 (six) hours as needed for wheezing.   albuterol (VENTOLIN HFA) 108 (90 Base) MCG/ACT inhaler Inhale 2 puffs into the lungs daily as needed for wheezing or shortness of breath.   aspirin EC 81 MG EC tablet Take 1 tablet (81 mg total) by mouth daily at 6 (six) AM. Swallow whole.   Blood Glucose Calibration (ACCU-CHEK GUIDE CONTROL) LIQD 1 each by In Vitro route every 30 (thirty) days. Needs Accu-check guide monitor system. DX. E11.9 (Patient taking differently: 1 each by In Vitro route every 30 (thirty) days. Needs Accu-check guide monitor system. DX. E11.9 2 times daily)   blood glucose meter kit and supplies Dispense based on patient and insurance preference. Use up to four times daily as directed. (FOR ICD-10 E10.9,  E11.9).   buPROPion (WELLBUTRIN) 75 MG tablet Take 1 tablet (75 mg total) by mouth 2 (two) times daily.   Cholecalciferol (VITAMIN D3) 1.25 MG (50000 UT) CAPS Take 1 capsule by mouth once a week.   Continuous Blood Gluc Sensor (DEXCOM G6 SENSOR) MISC 1 Device by Does not apply route as directed.   Continuous Blood Gluc Transmit (DEXCOM G6 TRANSMITTER) MISC 1 Device by Does not apply route as directed.   diclofenac Sodium (VOLTAREN) 1 % GEL Apply 2 g topically 4 (four) times daily. (Patient taking differently: Apply 2 g topically 4 (four) times daily as needed (pain).)   DULoxetine (CYMBALTA) 60 MG capsule Take 1 capsule (60 mg total) by mouth 2 (two) times daily.   empagliflozin (JARDIANCE) 25 MG TABS tablet Take 1 tablet (25 mg total) by mouth daily.    ferrous sulfate 325 (65 FE) MG tablet Take 1 tablet (325 mg total) by mouth every other day. (Patient taking differently: Take 325 mg by mouth daily.)   fluticasone (FLONASE) 50 MCG/ACT nasal spray Place 2 sprays into both nostrils daily as needed for allergies.   gabapentin (NEURONTIN) 300 MG capsule Take 1 capsule (300 mg total) by mouth 3 (three) times daily. (Patient taking differently: Take 300 mg by mouth 2 (two) times daily.)   insulin aspart (NOVOLOG FLEXPEN) 100 UNIT/ML FlexPen Max daily 30 units (Patient taking differently: Inject 1-4 Units into the skin 3 (three) times daily as needed (blood sugar over 150). Max daily 30 units)   Insulin Glargine (BASAGLAR KWIKPEN) 100 UNIT/ML Inject 26 Units into the skin at bedtime.   Insulin Pen Needle 31G X 5 MM MISC 1 Units by Does not apply route in the morning, at noon, in the evening, and at bedtime.   metFORMIN (GLUCOPHAGE-XR) 500 MG 24 hr tablet Take 1 tablet (500 mg total) by mouth in the morning and at bedtime.   ONETOUCH VERIO test strip CHECK UP TO 4 TIMES A DAY AS DIRECTED   rosuvastatin (CRESTOR) 20 MG tablet TAKE 1 TABLET(20 MG) BY MOUTH DAILY   Semaglutide,0.25 or 0.5MG/DOS, (OZEMPIC, 0.25 OR 0.5 MG/DOSE,) 2 MG/3ML SOPN Inject 0.5 mg into the skin once a week.   [DISCONTINUED] oxyCODONE (OXY IR/ROXICODONE) 5 MG immediate release tablet Take 1 tablet (5 mg total) by mouth every 6 (six) hours as needed for moderate pain, severe pain or breakthrough pain.   No facility-administered medications prior to visit.   Reviewed past medical and social history.   ROS per HPI above      Objective:  BP 98/62 (BP Location: Left Arm, Patient Position: Sitting, Cuff Size: Normal)   Pulse 68   Temp (!) 96.9 F (36.1 C) (Temporal)   Ht _0  (1.549 m)   Wt 192 lb 3.2 oz (87.2 kg)   SpO2 99%   BMI 36.32 kg/m      Physical Exam Cardiovascular:     Rate and Rhythm: Normal rate and regular rhythm.     Pulses: Normal pulses.     Heart  sounds: Normal heart sounds.  Pulmonary:     Effort: Pulmonary effort is normal.     Breath sounds: Normal breath sounds.  Abdominal:     Hernia: There is no hernia in the left inguinal area.  Musculoskeletal:        General: Tenderness present. No swelling, deformity or signs of injury.     Left upper leg: Tenderness and bony tenderness present. No swelling, edema, deformity or lacerations.  Left knee: Normal.     Right lower leg: No edema.     Left lower leg: Normal. No edema.  Lymphadenopathy:     Lower Body: No left inguinal adenopathy.  Skin:    General: Skin is warm and dry.     Findings: No erythema or rash.  Neurological:     Mental Status: She is alert and oriented to person, place, and time.  Psychiatric:        Mood and Affect: Mood normal.        Behavior: Behavior normal.        Thought Content: Thought content normal.     Results for orders placed or performed in visit on 08/27/21  Direct LDL  Result Value Ref Range   Direct LDL 123.0 mg/dL  Hepatic function panel  Result Value Ref Range   Total Bilirubin 0.3 0.2 - 1.2 mg/dL   Bilirubin, Direct 0.1 0.0 - 0.3 mg/dL   Alkaline Phosphatase 59 39 - 117 U/L   AST 11 0 - 37 U/L   ALT 8 0 - 35 U/L   Total Protein 7.4 6.0 - 8.3 g/dL   Albumin 4.2 3.5 - 5.2 g/dL      Assessment & Plan:    Problem List Items Addressed This Visit       Other   Bipolar disorder ([place])    Despite high PHQ and GAD score, she decline need for psychology and/or psychiatry referral. Denies any hallucinations or SI.  Maintain current med doses      HLD (hyperlipidemia) - Primary    Repeat direct LDL Maintain crestor dose      Relevant Orders   Direct LDL (Completed)   Hepatic function panel (Completed)   Other Visit Diagnoses     Pain of left femur       Relevant Orders   DG FEMUR MIN 2 VIEWS LEFT (Completed)     Normal femur x-ray. 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 Advised to use NSAID OCT for pain  management.  Return in about 6 months (around 02/27/2022) for HTN, hyperlipidemia (fasting).     Wilfred Lacy, NP

## 2021-08-27 NOTE — Patient Instructions (Addendum)
Maintain current medications Go to lab for left femur x-ray.  if normal, then pain is radiating from back. You will need to f/up with pain clinic

## 2021-08-27 NOTE — Assessment & Plan Note (Signed)
Despite high PHQ and GAD score, she decline need for psychology and/or psychiatry referral. Denies any hallucinations or SI.  Maintain current med doses

## 2021-08-27 NOTE — Progress Notes (Unsigned)
Initial visit for LEFT femur pain x 3 months Denies injury

## 2021-08-27 NOTE — Assessment & Plan Note (Signed)
Repeat direct LDL Maintain crestor dose

## 2021-08-28 ENCOUNTER — Ambulatory Visit
Admission: RE | Admit: 2021-08-28 | Discharge: 2021-08-28 | Disposition: A | Discharge status: Home / Self Care | Payer: Medicare HMO | Source: Ambulatory Visit | Attending: Pain Medicine | Admitting: Pain Medicine

## 2021-08-28 ENCOUNTER — Ambulatory Visit: Payer: Medicare HMO | Attending: Pain Medicine | Admitting: Pain Medicine

## 2021-08-28 VITALS — BP 90/68 | Temp 97.3°F | Resp 19 | Ht 61.0 in | Wt 192.0 lb

## 2021-08-28 DIAGNOSIS — M545 Low back pain, unspecified: Secondary | ICD-10-CM | POA: Insufficient documentation

## 2021-08-28 DIAGNOSIS — M47816 Spondylosis without myelopathy or radiculopathy, lumbar region: Secondary | ICD-10-CM | POA: Diagnosis not present

## 2021-08-28 DIAGNOSIS — G8929 Other chronic pain: Secondary | ICD-10-CM | POA: Diagnosis not present

## 2021-08-28 DIAGNOSIS — G8918 Other acute postprocedural pain: Secondary | ICD-10-CM | POA: Diagnosis not present

## 2021-08-28 DIAGNOSIS — M5137 Other intervertebral disc degeneration, lumbosacral region: Secondary | ICD-10-CM | POA: Diagnosis not present

## 2021-08-28 DIAGNOSIS — M47817 Spondylosis without myelopathy or radiculopathy, lumbosacral region: Secondary | ICD-10-CM | POA: Diagnosis not present

## 2021-08-28 MED ORDER — MIDAZOLAM HCL 5 MG/5ML IJ SOLN
0.5000 mg | Freq: Once | INTRAMUSCULAR | Status: AC
  Administered 2021-08-28: 4 mg via INTRAVENOUS

## 2021-08-28 MED ORDER — HYDROCODONE-ACETAMINOPHEN 5-325 MG PO TABS: 1.0000 | ORAL_TABLET | Freq: Three times a day (TID) | ORAL | 0 refills | Status: AC | PRN

## 2021-08-28 MED ORDER — ROPIVACAINE HCL 2 MG/ML IJ SOLN
9.0000 mL | Freq: Once | INTRAMUSCULAR | Status: AC
  Administered 2021-08-28: 9 mL via PERINEURAL

## 2021-08-28 MED ORDER — TRIAMCINOLONE ACETONIDE 40 MG/ML IJ SUSP
INTRAMUSCULAR | Status: AC
  Filled 2021-08-28: qty 1

## 2021-08-28 MED ORDER — FENTANYL CITRATE (PF) 100 MCG/2ML IJ SOLN
INTRAMUSCULAR | Status: AC
  Filled 2021-08-28: qty 2

## 2021-08-28 MED ORDER — MIDAZOLAM HCL 5 MG/5ML IJ SOLN
INTRAMUSCULAR | Status: AC
  Filled 2021-08-28: qty 5

## 2021-08-28 MED ORDER — FENTANYL CITRATE (PF) 100 MCG/2ML IJ SOLN
25.0000 ug | INTRAMUSCULAR | Status: DC | PRN
  Administered 2021-08-28: 100 ug via INTRAVENOUS

## 2021-08-28 MED ORDER — LIDOCAINE HCL 2 % IJ SOLN
INTRAMUSCULAR | Status: AC
  Filled 2021-08-28: qty 20

## 2021-08-28 MED ORDER — LACTATED RINGERS IV SOLN: Freq: Once | INTRAVENOUS | Status: DC

## 2021-08-28 MED ORDER — LIDOCAINE HCL 2 % IJ SOLN
20.0000 mL | Freq: Once | INTRAMUSCULAR | Status: AC
  Administered 2021-08-28: 400 mg

## 2021-08-28 MED ORDER — ROPIVACAINE HCL 2 MG/ML IJ SOLN
INTRAMUSCULAR | Status: AC
  Filled 2021-08-28: qty 20

## 2021-08-28 NOTE — Patient Instructions (Signed)
___________________________________________________________________________________________  Post-Radiofrequency (RF) Discharge Instructions  You have just completed a Radiofrequency Neurotomy.  The following instructions will provide you with information and guidelines for self-care upon discharge.  If at any time you have questions or concerns please call your physician. DO NOT DRIVE YOURSELF!!  Instructions: Apply ice: Fill a plastic sandwich bag with crushed ice. Cover it with a small towel and apply to injection site. Apply for 15 minutes then remove x 15 minutes. Repeat sequence on day of procedure, until you go to bed. The purpose is to minimize swelling and discomfort after procedure. Apply heat: Apply heat to procedure site starting the day following the procedure. The purpose is to treat any soreness and discomfort from the procedure. Food intake: No eating limitations, unless stipulated above.  Nevertheless, if you have had sedation, you may experience some nausea.  In this case, it may be wise to wait at least two hours prior to resuming regular diet. Physical activities: Keep activities to a minimum for the first 8 hours after the procedure. For the first 24 hours after the procedure, do not drive a motor vehicle,  Operate heavy machinery, power tools, or handle any weapons.  Consider walking with the use of an assistive device or accompanied by an adult for the first 24 hours.  Do not drink alcoholic beverages including beer.  Do not make any important decisions or sign any legal documents. Go home and rest today.  Resume activities tomorrow, as tolerated.  Use caution in moving about as you may experience mild leg weakness.  Use caution in cooking, use of household electrical appliances and climbing steps. Driving: If you have received any sedation, you are not allowed to drive for 24 hours after your procedure. Blood thinner: Restart your blood thinner 6 hours after your procedure. (Only  for those taking blood thinners) Insulin: As soon as you can eat, you may resume your normal dosing schedule. (Only for those taking insulin) Medications: May resume pre-procedure medications.  Do not take any drugs, other than what has been prescribed to you. Infection prevention: Keep procedure site clean and dry. Post-procedure Pain Diary: Extremely important that this be done correctly and accurately. Recorded information will be used to determine the next step in treatment. Pain evaluated is that of treated area only. Do not include pain from an untreated area. Complete every hour, on the hour, for the initial 8 hours. Set an alarm to help you do this part accurately. Do not go to sleep and have it completed later. It will not be accurate. Follow-up appointment: Keep your follow-up appointment after the procedure. Usually 2-6 weeks after radiofrequency. Bring you pain diary. The information collected will be essential for your long-term care.   Expect: From numbing medicine (AKA: Local Anesthetics): Numbness or decrease in pain. Onset: Full effect within 15 minutes of injected. Duration: It will depend on the type of local anesthetic used. On the average, 1 to 8 hours.  From steroids (when added): Decrease in swelling or inflammation. Once inflammation is improved, relief of the pain will follow. Onset of benefits: Depends on the amount of swelling present. The more swelling, the longer it will take for the benefits to be seen. In some cases, up to 10 days. Duration: Steroids will stay in the system x 2 weeks. Duration of benefits will depend on multiple posibilities including persistent irritating factors. From procedure: Some discomfort is to be expected once the numbing medicine wears off. In the case of radiofrequency procedures,  this may last as long as 6 weeks. Additional post-procedure pain medication is provided for this. Discomfort is minimized if ice and heat are applied as  instructed.  Call if: You experience numbness and weakness that gets worse with time, as opposed to wearing off. He experience any unusual bleeding, difficulty breathing, or loss of the ability to control your bowel and bladder. (This applies to Spinal procedures only) You experience any redness, swelling, heat, red streaks, elevated temperature, fever, or any other signs of a possible infection.  Emergency Numbers: [facility] hours (Monday - Thursday, 8:00 AM - 4:00 PM) (Friday, 9:00 AM - 12:00 Noon): [phone] After hours: [phone] ____________________________________________________________________________________________  ______________________________________________________________________  Preparing for Procedure with Sedation  NOTICE: Due to recent regulatory changes, starting on August 14, 2020, procedures requiring intravenous (IV) sedation will no longer be performed at the [facility].  These types of procedures are required to be performed at [facility] ambulatory surgery facility.  We are very sorry for the inconvenience.  Procedure appointments are limited to planned procedures: No Prescription Refills. No disability issues will be discussed. No medication changes will be discussed.  Instructions: Oral Intake: Do not eat or drink anything for at least 8 hours prior to your procedure. (Exception: Blood Pressure Medication. See below.) Transportation: A driver is required. You may not drive yourself after the procedure. Blood Pressure Medicine: Do not forget to take your blood pressure medicine with a sip of water the morning of the procedure. If your Diastolic (lower reading) is above 100 mmHg, elective cases will be cancelled/rescheduled. Blood thinners: These will need to be stopped for procedures. Notify our staff if you are taking any blood thinners. Depending on which one you take, there will be specific instructions on how and when to stop  it. Diabetics on insulin: Notify the staff so that you can be scheduled 1st case in the morning. If your diabetes requires high dose insulin, take only  of your normal insulin dose the morning of the procedure and notify the staff that you have done so. Preventing infections: Shower with an antibacterial soap the morning of your procedure. Build-up your immune system: Take 1000 mg of Vitamin C with every meal (3 times a day) the day prior to your procedure. Antibiotics: Inform the staff if you have a condition or reason that requires you to take antibiotics before dental procedures. Pregnancy: If you are pregnant, call and cancel the procedure. Sickness: If you have a cold, fever, or any active infections, call and cancel the procedure. Arrival: You must be in the facility at least 30 minutes prior to your scheduled procedure. Children: Do not bring children with you. Dress appropriately: There is always the possibility that your clothing may get soiled. Valuables: Do not bring any jewelry or valuables.  Reasons to call and reschedule or cancel your procedure: (Following these recommendations will minimize the risk of a serious complication.) Surgeries: Avoid having procedures within 2 weeks of any surgery. (Avoid for 2 weeks before or after any surgery). Flu Shots: Avoid having procedures within 2 weeks of a flu shots. (Avoid for 2 weeks before or after immunizations). Barium: Avoid having a procedure within 7-10 days after having had a radiological study involving the use of radiological contrast. (Myelograms, Barium swallow or enema study). Heart attacks: Avoid any elective procedures or surgeries for the initial 6 months after a "Myocardial Infarction" (Heart Attack). Blood thinners: It is imperative that you stop these medications before procedures. Let us  know if you if you take any blood thinner.  Infection: Avoid procedures during or within two weeks of an infection (including chest colds  or gastrointestinal problems). Symptoms associated with infections include: Localized redness, fever, chills, night sweats or profuse sweating, burning sensation when voiding, cough, congestion, stuffiness, runny nose, sore throat, diarrhea, nausea, vomiting, cold or Flu symptoms, recent or current infections. It is specially important if the infection is over the area that we intend to treat. Heart and lung problems: Symptoms that may suggest an active cardiopulmonary problem include: cough, chest pain, breathing difficulties or shortness of breath, dizziness, ankle swelling, uncontrolled high or unusually low blood pressure, and/or palpitations. If you are experiencing any of these symptoms, cancel your procedure and contact your primary care physician for an evaluation.  Remember:  Regular Business hours are:  Monday to Thursday 8:00 AM to 4:00 PM  Provider's Schedule: Milinda Pointer, MD:  Procedure days: Tuesday and Thursday 7:30 AM to 4:00 PM  Gillis Santa, MD:  Procedure days: Monday and Wednesday 7:30 AM to 4:00 PM ______________________________________________________________________  ____________________________________________________________________________________________  General Risks and Possible Complications  Patient Responsibilities: It is important that you read this as it is part of your informed consent. It is our duty to inform you of the risks and possible complications associated with treatments offered to you. It is your responsibility as a patient to read this and to ask questions about anything that is not clear or that you believe was not covered in this document.  Patient's Rights: You have the right to refuse treatment. You also have the right to change your mind, even after initially having agreed to have the treatment done. However, under this last option, if you wait until the last second to change your mind, you may be charged for the materials used up to that  point.  Introduction: Medicine is not an Chief Strategy Officer. Everything in Medicine, including the lack of treatment(s), carries the potential for danger, harm, or loss (which is by definition: Risk). In Medicine, a complication is a secondary problem, condition, or disease that can aggravate an already existing one. All treatments carry the risk of possible complications. The fact that a side effects or complications occurs, does not imply that the treatment was conducted incorrectly. It must be clearly understood that these can happen even when everything is done following the highest safety standards.  No treatment: You can choose not to proceed with the proposed treatment alternative. The "PRO(s)" would include: avoiding the risk of complications associated with the therapy. The "CON(s)" would include: not getting any of the treatment benefits. These benefits fall under one of three categories: diagnostic; therapeutic; and/or palliative. Diagnostic benefits include: getting information which can ultimately lead to improvement of the disease or symptom(s). Therapeutic benefits are those associated with the successful treatment of the disease. Finally, palliative benefits are those related to the decrease of the primary symptoms, without necessarily curing the condition (example: decreasing the pain from a flare-up of a chronic condition, such as incurable terminal cancer).  General Risks and Complications: These are associated to most interventional treatments. They can occur alone, or in combination. They fall under one of the following six (6) categories: no benefit or worsening of symptoms; bleeding; infection; nerve damage; allergic reactions; and/or death. No benefits or worsening of symptoms: In Medicine there are no guarantees, only probabilities. No healthcare provider can ever guarantee that a medical treatment will work, they can only state the probability that it may. Furthermore, there  is always the  possibility that the condition may worsen, either directly, or indirectly, as a consequence of the treatment. Bleeding: This is more common if the patient is taking a blood thinner, either prescription or over the counter (example: Goody Powders, Fish oil, Aspirin, Garlic, etc.), or if suffering a condition associated with impaired coagulation (example: Hemophilia, cirrhosis of the liver, low platelet counts, etc.). However, even if you do not have one on these, it can still happen. If you have any of these conditions, or take one of these drugs, make sure to notify your treating physician. Infection: This is more common in patients with a compromised immune system, either due to disease (example: diabetes, cancer, human immunodeficiency virus [HIV], etc.), or due to medications or treatments (example: therapies used to treat cancer and rheumatological diseases). However, even if you do not have one on these, it can still happen. If you have any of these conditions, or take one of these drugs, make sure to notify your treating physician. Nerve Damage: This is more common when the treatment is an invasive one, but it can also happen with the use of medications, such as those used in the treatment of cancer. The damage can occur to small secondary nerves, or to large primary ones, such as those in the spinal cord and brain. This damage may be temporary or permanent and it may lead to impairments that can range from temporary numbness to permanent paralysis and/or brain death. Allergic Reactions: Any time a substance or material comes in contact with our body, there is the possibility of an allergic reaction. These can range from a mild skin rash (contact dermatitis) to a severe systemic reaction (anaphylactic reaction), which can result in death. Death: In general, any medical intervention can result in death, most of the time due to an unforeseen  complication. ____________________________________________________________________________________________

## 2021-08-29 ENCOUNTER — Telehealth: Payer: Self-pay | Admitting: *Deleted

## 2021-08-29 NOTE — Telephone Encounter (Signed)
Post procedure call;  patient reports that she is doing okay.  States she is having is problems with her left knee, has had to start using her cane to walk.  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  States the knee gives out on her and she is being very careful.  This  is an ongoing issue, she does not feel that it is related to the procedure at all.  Patient is hoping that after she gets her back straightened out that FN can help her with her left knee.

## 2021-08-29 NOTE — Telephone Encounter (Signed)
Called patient to let her know that I have communicated with FN and he wants her to continue to monitor.  Also, at her post procedure evaluation, let us know how the left knee is doing.  Patient verbalizes u/o information.

## 2021-08-31 DIAGNOSIS — M7062 Trochanteric bursitis, left hip: Secondary | ICD-10-CM | POA: Diagnosis not present

## 2021-08-31 DIAGNOSIS — M25552 Pain in left hip: Secondary | ICD-10-CM | POA: Diagnosis not present

## 2021-09-05 ENCOUNTER — Telehealth: Payer: Self-pay | Admitting: Nurse Practitioner

## 2021-09-05 NOTE — Telephone Encounter (Signed)
Caller Name: Shandie Bertz Call back phone #: [phone]  Reason for Call: Has not received spine lab results, asking if she would need a referral

## 2021-09-06 NOTE — Telephone Encounter (Signed)
Called & spoke w/ pt, says she was waiting on her referral to the spine specialist but they have called her and scheduled an appt.

## 2021-09-08 ENCOUNTER — Other Ambulatory Visit: Payer: Self-pay | Admitting: Endocrinology

## 2021-09-10 DIAGNOSIS — M778 Other enthesopathies, not elsewhere classified: Secondary | ICD-10-CM | POA: Diagnosis not present

## 2021-09-13 DIAGNOSIS — G5622 Lesion of ulnar nerve, left upper limb: Secondary | ICD-10-CM | POA: Diagnosis not present

## 2021-09-13 DIAGNOSIS — G5602 Carpal tunnel syndrome, left upper limb: Secondary | ICD-10-CM | POA: Diagnosis not present

## 2021-09-13 DIAGNOSIS — T7840XS Allergy, unspecified, sequela: Secondary | ICD-10-CM | POA: Diagnosis not present

## 2021-09-13 DIAGNOSIS — R29898 Other symptoms and signs involving the musculoskeletal system: Secondary | ICD-10-CM | POA: Diagnosis not present

## 2021-09-13 DIAGNOSIS — M778 Other enthesopathies, not elsewhere classified: Secondary | ICD-10-CM | POA: Diagnosis not present

## 2021-09-13 DIAGNOSIS — M25632 Stiffness of left wrist, not elsewhere classified: Secondary | ICD-10-CM | POA: Diagnosis not present

## 2021-09-18 DIAGNOSIS — R29898 Other symptoms and signs involving the musculoskeletal system: Secondary | ICD-10-CM | POA: Diagnosis not present

## 2021-09-18 DIAGNOSIS — T7840XS Allergy, unspecified, sequela: Secondary | ICD-10-CM | POA: Diagnosis not present

## 2021-09-18 DIAGNOSIS — G5602 Carpal tunnel syndrome, left upper limb: Secondary | ICD-10-CM | POA: Diagnosis not present

## 2021-09-18 DIAGNOSIS — M778 Other enthesopathies, not elsewhere classified: Secondary | ICD-10-CM | POA: Diagnosis not present

## 2021-09-18 DIAGNOSIS — G5622 Lesion of ulnar nerve, left upper limb: Secondary | ICD-10-CM | POA: Diagnosis not present

## 2021-09-18 DIAGNOSIS — M25632 Stiffness of left wrist, not elsewhere classified: Secondary | ICD-10-CM | POA: Diagnosis not present

## 2021-09-19 DIAGNOSIS — E1165 Type 2 diabetes mellitus with hyperglycemia: Secondary | ICD-10-CM | POA: Diagnosis not present

## 2021-09-19 DIAGNOSIS — E1142 Type 2 diabetes mellitus with diabetic polyneuropathy: Secondary | ICD-10-CM | POA: Diagnosis not present

## 2021-09-20 DIAGNOSIS — Z6836 Body mass index (BMI) 36.0-36.9, adult: Secondary | ICD-10-CM | POA: Diagnosis not present

## 2021-09-20 DIAGNOSIS — M5440 Lumbago with sciatica, unspecified side: Secondary | ICD-10-CM | POA: Diagnosis not present

## 2021-09-23 NOTE — Progress Notes (Unsigned)
Name: ARDEAN SIMONICH  Age/ Sex: 61 y.o., female   MRN/ DOB: [id number], 1960/06/18     PCP: Flossie Buffy, NP   Reason for Endocrinology Evaluation: Type 2 Diabetes Mellitus  Initial Endocrine Consultative Visit: 03/08/2019    PATIENT IDENTIFIER: Autumn Valenzuela is a 61 y.o. female with a past medical history of .T2Dm, bipolar disorder,migrane headaches  and dyslipidemia  The patient has followed with Endocrinology clinic since 03/08/2019 for consultative assistance with management of her diabetes.  DIABETIC HISTORY:  Ms. Rease was diagnosed with T2DM in 2018.  She has been tried on oral glycemic agents such as glipizide and Metformin, she is intolerant to higher doses of Metformin.  She has been on insulin since 2018.  Her hemoglobin A1c has ranged from  6.7% in 2018, peaking at 14.6% in 2021   On her initial visit to our clinic she had an A1c of 14.6%, she was on metformin and Lantus. We continued that and started Jardiance. By 09/2020 we provided her with novolog per correction scale   Started Ozempic  SUBJECTIVE:   During the last visit (05/18/2021): A1c of 9.2 %.     Today (09/24/2021): Ms. Daniele is here for follow-up on diabetes management. .She checks her  glucose multiple times daily through CGM. She has had multiple episodes of hypoglycemic episodes, she is symptomatic with these episodes.    She is S/P excisional biopsy of inguinal L. N  which was benign  She has been noted with weight loss  Denies nausea, vomiting or diarrhea    HOME DIABETES REGIMEN:  Metformin 500 mg BID  Lantus 26 units daily Jardiance 25 mg daily  Ozempic  0.5 mg daily  Correction scale : Novolog (BG- 130/20)     Statin: Yes ACE-I/ARB: No  CONTINUOUS GLUCOSE MONITORING RECORD INTERPRETATION    Dates of Recording: 8/29-9/11/2021  Sensor description:dexcom  Results statistics:   CGM use % of time 21  Average and SD 84/22  Time in range  73      %  % Time Above 180 0  % Time  above 250 0  % Time Below target 22   Glycemic patterns summary: optimal Bg's during the day and night   Hyperglycemic episodes n/a  Hypoglycemic episodes occurred mainly at night and at times during the day   Overnight periods: low       DIABETIC COMPLICATIONS: Microvascular complications:  Neuropathy Denies: CKD, retinopathy  Last eye exam: Completed 08/02/2021   Macrovascular complications:   S/P left common femoral endarterectomy with bovine patch on 02/12/2021 Denies: CAD, CVA     HISTORY:  Past Medical History:  Past Medical History:  Diagnosis Date   Allergy    Anemia    Anxiety    Arthritis    Asthma    Back pain    Bipolar disorder ([place])    CVA (cerebral infarction)    Depression    Diabetes mellitus without complication (HCC)    High cholesterol    Hypertension    Left leg pain    Migraine    Sleep apnea    does not use a cpap   Stroke ([place]) 2007   no lasting weakness   Past Surgical History:  Past Surgical History:  Procedure Laterality Date   ABDOMINAL AORTOGRAM W/LOWER EXTREMITY N/A 02/08/2021   Procedure: ABDOMINAL AORTOGRAM W/LOWER EXTREMITY;  Surgeon: Marty Heck, MD;  Location: [facility];  Service: Cardiovascular;  Laterality: N/A;  ABDOMINAL HYSTERECTOMY     ANTERIOR LATERAL LUMBAR FUSION WITH PERCUTANEOUS SCREW 1 LEVEL Left 02/11/2018   Procedure: LEFT LATERAL LUMBAR  FOUR-FIVE INTERBODY FUSION WITH INSTRUMENTATION AND ALLOGRAFT;  Surgeon: Phylliss Bob, MD;  Location: [facility];  Service: Orthopedics;  Laterality: Left;  LEFT LATERAL LUMBAR  FOUR-FIVE INTERBODY FUSION WITH INSTRUMENTATION AND ALLOGRAFT   BACK SURGERY     Lumbar fusion L5-S1   DIABETES     ENDARTERECTOMY FEMORAL Left 02/12/2021   Procedure: LEFT COMMON FEMORAL ENDARTERECTOMY WITH BOVINE PATCH;  Surgeon: Marty Heck, MD;  Location: [facility];  Service: Vascular;  Laterality: Left;   EYE SURGERY Bilateral    INGUINAL LYMPH NODE BIOPSY Left 07/25/2021    Procedure: EXCISIONAL BIOPSY LEFT INGUINAL LYMPH NODES;  Surgeon: Coralie Keens, MD;  Location: [facility];  Service: General;  Laterality: Left;   KNEE SURGERY     l 4-l5 status post lateral posterior fusion January 30,2020     NEUROFORAMINAL STENOSIS Left    Severe L4-L5, involving the level above previous fusion.   PARTIAL HYSTERECTOMY     RADICULOPATHY Left    L4, Secondary to an L4-L5 Spondylolisthesis   SHOULDER SURGERY     Social History:  reports that she quit smoking about 5 years ago. Her smoking use included cigarettes. She has never used smokeless tobacco. She reports that she does not currently use alcohol. She reports that she does not currently use drugs after having used the following drugs: Marijuana. Family History:  Family History  Problem Relation Age of Onset   Diabetes Mother    Stroke Father    Cancer Paternal Aunt    Cancer Paternal Aunt    Diabetes Brother    Colon cancer Neg Hx    Esophageal cancer Neg Hx    Rectal cancer Neg Hx    Stomach cancer Neg Hx      HOME MEDICATIONS: Allergies as of 09/24/2021   No Known Allergies      Medication List        Accurate as of September 24, 2021  8:15 AM. If you have any questions, ask your nurse or doctor.          Accu-Chek FastClix Lancets Misc TEST 4 TIMES DAILY. DX E11.9   Accu-Chek Guide Control Liqd 1 each by In Vitro route every 30 (thirty) days. Needs Accu-check guide monitor system. DX. E11.9 What changed: additional instructions   albuterol 108 (90 Base) MCG/ACT inhaler Commonly known as: VENTOLIN HFA Inhale 2 puffs into the lungs daily as needed for wheezing or shortness of breath.   albuterol (2.5 MG/3ML) 0.083% nebulizer solution Commonly known as: PROVENTIL Take 2.5 mg by nebulization every 6 (six) hours as needed for wheezing.   aspirin EC 81 MG tablet Take 1 tablet (81 mg total) by mouth daily at 6 (six) AM. Swallow whole.   Basaglar KwikPen 100  UNIT/ML ADMINISTER 100 UNITS UNDER THE SKIN DAILY   blood glucose meter kit and supplies Dispense based on patient and insurance preference. Use up to four times daily as directed. (FOR ICD-10 E10.9, E11.9).   buPROPion 75 MG tablet Commonly known as: WELLBUTRIN Take 1 tablet (75 mg total) by mouth 2 (two) times daily.   Dexcom G6 Sensor Misc 1 Device by Does not apply route as directed.   Dexcom G6 Transmitter Misc 1 Device by Does not apply route as directed.   diclofenac Sodium 1 % Gel Commonly known as: Voltaren Apply 2 g topically  4 (four) times daily. What changed:  when to take this reasons to take this   DULoxetine 60 MG capsule Commonly known as: CYMBALTA Take 1 capsule (60 mg total) by mouth 2 (two) times daily.   empagliflozin 25 MG Tabs tablet Commonly known as: Jardiance Take 1 tablet (25 mg total) by mouth daily.   ferrous sulfate 325 (65 FE) MG tablet Take 1 tablet (325 mg total) by mouth every other day. What changed: when to take this   fluticasone 50 MCG/ACT nasal spray Commonly known as: FLONASE Place 2 sprays into both nostrils daily as needed for allergies.   gabapentin 300 MG capsule Commonly known as: NEURONTIN Take 1 capsule (300 mg total) by mouth 3 (three) times daily. What changed: when to take this   Insulin Pen Needle 31G X 5 MM Misc 1 Units by Does not apply route in the morning, at noon, in the evening, and at bedtime.   metFORMIN 500 MG 24 hr tablet Commonly known as: GLUCOPHAGE-XR Take 1 tablet (500 mg total) by mouth in the morning and at bedtime.   NovoLOG FlexPen 100 UNIT/ML FlexPen Generic drug: insulin aspart Max daily 30 units What changed:  how much to take how to take this when to take this reasons to take this   OneTouch Verio test strip Generic drug: glucose blood CHECK UP TO 4 TIMES A DAY AS DIRECTED   Ozempic (0.25 or 0.5 MG/DOSE) 2 MG/3ML Sopn Generic drug: Semaglutide(0.25 or 0.5MG/DOS) Inject 0.5 mg into  the skin once a week.   rosuvastatin 20 MG tablet Commonly known as: CRESTOR TAKE 1 TABLET(20 MG) BY MOUTH DAILY   Vitamin D3 1.25 MG (50000 UT) Caps Take 1 capsule by mouth once a week.         OBJECTIVE:   Vital Signs: BP 126/88 (BP Location: Left Arm, Patient Position: Sitting, Cuff Size: Large)   Ht '5\' 1"'  (1.549 m)   Wt 196 lb (88.9 kg)   BMI 37.03 kg/m   Wt Readings from Last 3 Encounters:  09/24/21 196 lb (88.9 kg)  08/28/21 192 lb (87.1 kg)  08/27/21 192 lb 3.2 oz (87.2 kg)     Exam: General: Pt appears well and is in NAD  Lungs: Clear with good BS bilat with no rales, rhonchi, or wheezes  Heart: RRR  Extremities: No pretibial edema.   Neuro: MS is good with appropriate affect, pt is alert and Ox3   DM Foot Exam 01/19/2021    The skin of the feet is intact without sores or ulcerations. The pedal pulses are undetectable  The sensation is absent to a screening 5.07, 10 gram monofilament bilaterally    DATA REVIEWED:  Lab Results  Component Value Date   HGBA1C 5.8 (A) 09/24/2021   HGBA1C 9.2 (A) 05/18/2021   HGBA1C 8.7 (H) 02/12/2021   Lab Results  Component Value Date   MICROALBUR 1.4 01/17/2021   LDLCALC 123 (H) 02/13/2021   CREATININE 0.73 07/25/2021   Lab Results  Component Value Date   MICRALBCREAT 0.8 01/17/2021    Latest Reference Range & Units 07/25/21 10:50 08/27/21 11:33  Sodium 135 - 145 mmol/L 143   Potassium 3.5 - 5.1 mmol/L 3.7   Chloride 98 - 111 mmol/L 109   CO2 22 - 32 mmol/L 22   Glucose 70 - 99 mg/dL 98   BUN 6 - 20 mg/dL 9   Creatinine 0.44 - 1.00 mg/dL 0.73   Calcium 8.9 - 10.3 mg/dL 9.2  Anion gap 5 - 15  12   Alkaline Phosphatase 39 - 117 U/L  59  Albumin 3.5 - 5.2 g/dL  4.2  AST 0 - 37 U/L  11  ALT 0 - 35 U/L  8  Total Protein 6.0 - 8.3 g/dL  7.4  Bilirubin, Direct 0.0 - 0.3 mg/dL  0.1  Total Bilirubin 0.2 - 1.2 mg/dL  0.3  GFR, Estimated >60 mL/min >60      ASSESSMENT / PLAN / RECOMMENDATIONS:   1) Type  2 Diabetes Mellitus, Optimally  controlled , With neuropathic and macrovascular complications - Most recent A1c of 5.8 %. Goal A1c <7.0%.    - Pt with recurrent hypoglycemia since starting Ozempic  - Will reduce basal insulin as below  - The goal in the future is to wean her off insulin   MEDICATIONS: Continue Ozempic 0.5 mg weekly Continue Metformin 500 mg 1 tablet with breakfast Decrease  lantus  18 units daily  Continue Jardiance 25 mg daily Correction scale : Novolog (BG- 130/20)   EDUCATION / INSTRUCTIONS: BG monitoring instructions: Patient is instructed to check her blood sugars 2 times a day, fasting and bedtime as much as possible Call [facility] if: BG persistently  <70 I reviewed the Rule of 15 for the treatment of hypoglycemia in detail with the patient. Literature supplied.    2) Diabetic complications:  Eye: Does not have known diabetic retinopathy.  Neuro/ Feet: Does have known diabetic peripheral neuropathy. Renal: Patient does not have known baseline CKD. She is not an ACEI/ARB at present     F/U in 6 months    Signed electronically by: Mack Guise, MD  [facility]  [facility] [address] [place], [place] [address] Phone: [phone] FAX: [phone]   CC: [address], [place] [address] Phone: [phone]  Fax: [phone]  Return to Endocrinology clinic as below: Future Appointments  Date Time Provider [place]  10/18/2021  9:15 AM [facility] CCM PHARMACIST [facility]

## 2021-09-24 ENCOUNTER — Ambulatory Visit (INDEPENDENT_AMBULATORY_CARE_PROVIDER_SITE_OTHER): Payer: Medicare HMO | Admitting: Internal Medicine

## 2021-09-24 ENCOUNTER — Encounter: Payer: Self-pay | Admitting: Internal Medicine

## 2021-09-24 VITALS — BP 126/88 | Ht 61.0 in | Wt 196.0 lb

## 2021-09-24 DIAGNOSIS — E1142 Type 2 diabetes mellitus with diabetic polyneuropathy: Secondary | ICD-10-CM

## 2021-09-24 DIAGNOSIS — E1159 Type 2 diabetes mellitus with other circulatory complications: Secondary | ICD-10-CM | POA: Diagnosis not present

## 2021-09-24 DIAGNOSIS — Z794 Long term (current) use of insulin: Secondary | ICD-10-CM | POA: Diagnosis not present

## 2021-09-24 LAB — POCT GLYCOSYLATED HEMOGLOBIN (HGB A1C): Hemoglobin A1C: 5.8 % — AB (ref 4.0–5.6)

## 2021-09-24 MED ORDER — METFORMIN HCL ER 500 MG PO TB24: 500.0000 mg | ORAL_TABLET | Freq: Two times a day (BID) | ORAL | 3 refills | Status: DC

## 2021-09-24 MED ORDER — BASAGLAR KWIKPEN 100 UNIT/ML ~~LOC~~ SOPN: 18.0000 [IU] | PEN_INJECTOR | Freq: Every day | SUBCUTANEOUS | 11 refills | Status: DC

## 2021-09-24 MED ORDER — EMPAGLIFLOZIN 25 MG PO TABS: 25.0000 mg | ORAL_TABLET | Freq: Every day | ORAL | 3 refills | Status: DC

## 2021-09-24 MED ORDER — INSULIN PEN NEEDLE 31G X 5 MM MISC: 1.0000 [IU] | Freq: Four times a day (QID) | 1 refills | Status: DC

## 2021-09-24 MED ORDER — OZEMPIC (0.25 OR 0.5 MG/DOSE) 2 MG/3ML ~~LOC~~ SOPN: 0.5000 mg | PEN_INJECTOR | SUBCUTANEOUS | 3 refills | Status: DC

## 2021-09-24 NOTE — Patient Instructions (Addendum)
-   Continue  Ozempic 0.5 mg weekly  - Continue Metformin 500 mg 1 tablet Twice a day  - Decrease lantus 18 units daily  - Continue Jardiance to 25 mg , 1 tablet daily  Novolog correctional insulin: Use the scale below to help guide you:   Blood sugar before meal Number of units to inject  Less than 150 0 unit  151 -  170 1 units  171 -  190 2 units  191 -  210 3 units  211 -  230 4 units  231 -  250 5 units  251 -  270 6 units  271 -  290 7 units  291 -  310 8 units  311 - 330 9 units   331 -  350 10 units         HOW TO TREAT LOW BLOOD SUGARS (Blood sugar LESS THAN 70 MG/DL) Please follow the RULE OF 15 for the treatment of hypoglycemia treatment (when your (blood sugars are less than 70 mg/dL)   STEP 1: Take 15 grams of carbohydrates when your blood sugar is low, which includes:  3-4 GLUCOSE TABS  OR 3-4 OZ OF JUICE OR REGULAR SODA OR ONE TUBE OF GLUCOSE GEL    STEP 2: RECHECK blood sugar in 15 MINUTES STEP 3: If your blood sugar is still low at the 15 minute recheck --> then, go back to STEP 1 and treat AGAIN with another 15 grams of carbohydrates.

## 2021-09-26 ENCOUNTER — Telehealth: Payer: Self-pay

## 2021-09-26 NOTE — Progress Notes (Signed)
Chronic Care Management Pharmacy Assistant   Name: Autumn Valenzuela  MRN: [id number] DOB: 22-Apr-1960  Reason for Encounter:Diabetes Disease State Call.   Recent office visits:  08/27/2021 Wilfred Lacy NP (PCP) No medication changes noted, return in 6 months  Recent consult visits:  09/24/2021 Dr. Kelton Pillar MD (Endocrinology) Decrease  lantus  18 units daily, follow up in 6 months  09/10/2021 Dr. Burney Gauze MD (Ortho Surgery) Start celecoxib  200 MG capsule  daily, stop Hydrocodone-acetaminophen, stop Oxycodone-acetaminophen, stop Tramadol 08/20/2021 Robert Dasnoit PA-C (Ortho Surgery) start methylprednisolone 4 mg tablet   08/07/2021 Dr. Dossie Arbour MD (Pain Medicine) No medication changes noted 08/02/2021 Clent Jacks (Ophthalmology) Unable to see note 07/24/2021 Dr. Dossie Arbour MD (Pain Medicine) 07/20/2021 Unable to see note 07/12/2021 Dr. Dossie Arbour MD (Pain Medicine) No medication changes noted 07/03/2021 Dr. Ninfa Linden MD (General Surgery) No Medication Changes noted  Hospital visits:  Medication Reconciliation was completed by comparing discharge summary, patient's EMR and Pharmacy list, and upon discussion with patient.  Admitted to the hospital on 07/25/2021 due to [place]  . Discharge date was 07/25/2021. Discharged from [facility]?Medications Started at [facility] Discharge:?? -started None ID  Medication Changes at Hospital Discharge: -Changed None ID  Medications Discontinued at Hospital Discharge: -Stopped None ID  Medications that remain the same after Hospital Discharge:??  -All other medications will remain the same.    Medications: Outpatient Encounter Medications as of 09/26/2021  Medication Sig   Accu-Chek FastClix Lancets MISC TEST 4 TIMES DAILY. DX E11.9   albuterol (PROVENTIL) (2.5 MG/3ML) 0.083% nebulizer solution Take 2.5 mg by nebulization every 6 (six) hours as needed for wheezing.   albuterol (VENTOLIN HFA) 108 (90 Base) MCG/ACT  inhaler Inhale 2 puffs into the lungs daily as needed for wheezing or shortness of breath.   aspirin EC 81 MG EC tablet Take 1 tablet (81 mg total) by mouth daily at 6 (six) AM. Swallow whole.   Blood Glucose Calibration (ACCU-CHEK GUIDE CONTROL) LIQD 1 each by In Vitro route every 30 (thirty) days. Needs Accu-check guide monitor system. DX. E11.9 (Patient taking differently: 1 each by In Vitro route every 30 (thirty) days. Needs Accu-check guide monitor system. DX. E11.9 2 times daily)   blood glucose meter kit and supplies Dispense based on patient and insurance preference. Use up to four times daily as directed. (FOR ICD-10 E10.9, E11.9).   buPROPion (WELLBUTRIN) 75 MG tablet Take 1 tablet (75 mg total) by mouth 2 (two) times daily.   Cholecalciferol (VITAMIN D3) 1.25 MG (50000 UT) CAPS Take 1 capsule by mouth once a week.   Continuous Blood Gluc Sensor (DEXCOM G6 SENSOR) MISC 1 Device by Does not apply route as directed.   Continuous Blood Gluc Transmit (DEXCOM G6 TRANSMITTER) MISC 1 Device by Does not apply route as directed.   diclofenac Sodium (VOLTAREN) 1 % GEL Apply 2 g topically 4 (four) times daily. (Patient taking differently: Apply 2 g topically 4 (four) times daily as needed (pain).)   DULoxetine (CYMBALTA) 60 MG capsule Take 1 capsule (60 mg total) by mouth 2 (two) times daily.   empagliflozin (JARDIANCE) 25 MG TABS tablet Take 1 tablet (25 mg total) by mouth daily.   ferrous sulfate 325 (65 FE) MG tablet Take 1 tablet (325 mg total) by mouth every other day. (Patient taking differently: Take 325 mg by mouth daily.)   fluticasone (FLONASE) 50 MCG/ACT nasal spray Place 2 sprays into both nostrils daily as needed for  allergies.   gabapentin (NEURONTIN) 300 MG capsule Take 1 capsule (300 mg total) by mouth 3 (three) times daily. (Patient taking differently: Take 300 mg by mouth 2 (two) times daily.)   insulin aspart (NOVOLOG FLEXPEN) 100 UNIT/ML FlexPen Max daily 30 units (Patient taking  differently: Inject 1-4 Units into the skin 3 (three) times daily as needed (blood sugar over 150). Max daily 30 units)   Insulin Glargine (BASAGLAR KWIKPEN) 100 UNIT/ML Inject 18 Units into the skin daily.   Insulin Pen Needle 31G X 5 MM MISC 1 Units by Does not apply route in the morning, at noon, in the evening, and at bedtime.   metFORMIN (GLUCOPHAGE-XR) 500 MG 24 hr tablet Take 1 tablet (500 mg total) by mouth in the morning and at bedtime.   ONETOUCH VERIO test strip CHECK UP TO 4 TIMES A DAY AS DIRECTED   rosuvastatin (CRESTOR) 20 MG tablet TAKE 1 TABLET(20 MG) BY MOUTH DAILY   Semaglutide,0.25 or 0.5MG/DOS, (OZEMPIC, 0.25 OR 0.5 MG/DOSE,) 2 MG/3ML SOPN Inject 0.5 mg into the skin once a week.   No facility-administered encounter medications on file as of 09/26/2021.    Care Gaps: Influenza Vaccine COVID-19 Vaccine    Star Rating Drugs: Rosuvastatin 20 mg last filled 06/27/2021 for 90 day supply at [facility]. Jardiance 25 mg last filled 09/25/2021 for 90 day supply at [facility]. Metformin 500 mg last filled 08/20/2021 for 90 day supply at [facility].   Medication Fill Gaps: None ID  Recent Relevant Labs: Lab Results  Component Value Date/Time   HGBA1C 5.8 (A) 09/24/2021 08:11 AM   HGBA1C 9.2 (A) 05/18/2021 08:11 AM   HGBA1C 8.7 (H) 02/12/2021 01:14 PM   HGBA1C 7.9 (H) 11/28/2020 09:30 AM   MICROALBUR 1.4 01/17/2021 08:36 AM   MICROALBUR <0.7 09/22/2019 01:27 PM    Kidney Function Lab Results  Component Value Date/Time   CREATININE 0.73 07/25/2021 10:50 AM   CREATININE 0.80 07/23/2021 12:22 PM   GFR 95.70 01/17/2021 08:36 AM   GFRNONAA >60 07/25/2021 10:50 AM   GFRAA >60 02/09/2018 08:10 AM    Current antihyperglycemic regimen:  Novolog 1-4 units three times daily with meals as needed Basaglar 18 units nightly Jardiance 25 mg daily Metformin XR 500 mg daily with breakfast Ozempic 0.5 mg weekly   What recent interventions/DTPs have been  made to improve glycemic control:  09/24/2021 Dr. Kelton Pillar MD (Endocrinology) Decrease  lantus  18 units daily, follow up in 6 months   Have there been any recent hospitalizations or ED visits since last visit with CPP? Yes  Patient denies hypoglycemic symptoms, including Pale, Sweaty, Shaky, Hungry, Nervous/irritable, and Vision changes  Patient denies hyperglycemic symptoms, including blurry vision, excessive thirst, fatigue, polyuria, and weakness  How often are you checking your blood sugar?  Patient reports she wears a Dexcom deceive.   What are your blood sugars ranging?  None ID  During the week, how often does your blood glucose drop below 70? Never  Are you checking your feet daily/regularly?   Yes, patient denies any tingling,pain or numbness in her feet.  Adherence Review: Is the patient currently on a STATIN medication? Yes Is the patient currently on ACE/ARB medication? No Does the patient have >5 day gap between last estimated fill dates? No  Anderson [place] Clinical Production designer, theatre/television/film [phone]

## 2021-10-04 ENCOUNTER — Other Ambulatory Visit: Payer: Self-pay

## 2021-10-04 ENCOUNTER — Encounter: Payer: Self-pay | Admitting: Physical Therapy

## 2021-10-04 ENCOUNTER — Ambulatory Visit: Payer: Medicare HMO | Attending: Orthopaedic Surgery | Admitting: Physical Therapy

## 2021-10-04 DIAGNOSIS — M79605 Pain in left leg: Secondary | ICD-10-CM | POA: Diagnosis not present

## 2021-10-04 DIAGNOSIS — M5459 Other low back pain: Secondary | ICD-10-CM | POA: Insufficient documentation

## 2021-10-04 DIAGNOSIS — M6281 Muscle weakness (generalized): Secondary | ICD-10-CM | POA: Insufficient documentation

## 2021-10-04 NOTE — Therapy (Signed)
OUTPATIENT PHYSICAL THERAPY EVALUATION   Patient Name: Autumn Valenzuela MRN: [id number] DOB:10/15/1960, 61 y.o., female Today's Date: 10/04/2021   PT End of Session - 10/04/21 0846     Visit Number 1    Number of Visits 9    Date for PT Re-Evaluation 11/29/21    Authorization Type AETNA MEDICARE    Progress Note Due on Visit 10    PT Start Time 0832    PT Stop Time 0915    PT Time Calculation (min) 43 min    Activity Tolerance Patient tolerated treatment well    Behavior During Therapy [facility] for tasks assessed/performed             Past Medical History:  Diagnosis Date   Allergy    Anemia    Anxiety    Arthritis    Asthma    Back pain    Bipolar disorder ([place])    CVA (cerebral infarction)    Depression    Diabetes mellitus without complication (HCC)    High cholesterol    Hypertension    Left leg pain    Migraine    Sleep apnea    does not use a cpap   Stroke ([place]) 2007   no lasting weakness   Past Surgical History:  Procedure Laterality Date   ABDOMINAL AORTOGRAM W/LOWER EXTREMITY N/A 02/08/2021   Procedure: ABDOMINAL AORTOGRAM W/LOWER EXTREMITY;  Surgeon: Marty Heck, MD;  Location: [place] [facility];  Service: Cardiovascular;  Laterality: N/A;   ABDOMINAL HYSTERECTOMY     ANTERIOR LATERAL LUMBAR FUSION WITH PERCUTANEOUS SCREW 1 LEVEL Left 02/11/2018   Procedure: LEFT LATERAL LUMBAR  FOUR-FIVE INTERBODY FUSION WITH INSTRUMENTATION AND ALLOGRAFT;  Surgeon: Phylliss Bob, MD;  Location: [place];  Service: Orthopedics;  Laterality: Left;  LEFT LATERAL LUMBAR  FOUR-FIVE INTERBODY FUSION WITH INSTRUMENTATION AND ALLOGRAFT   BACK SURGERY     Lumbar fusion L5-S1   DIABETES     ENDARTERECTOMY FEMORAL Left 02/12/2021   Procedure: LEFT COMMON FEMORAL ENDARTERECTOMY WITH BOVINE PATCH;  Surgeon: Marty Heck, MD;  Location: [place];  Service: Vascular;  Laterality: Left;   EYE SURGERY Bilateral    INGUINAL LYMPH NODE BIOPSY Left 07/25/2021   Procedure:  EXCISIONAL BIOPSY LEFT INGUINAL LYMPH NODES;  Surgeon: Coralie Keens, MD;  Location: [place];  Service: General;  Laterality: Left;   KNEE SURGERY     l 4-l5 status post lateral posterior fusion January 30,2020     NEUROFORAMINAL STENOSIS Left    Severe L4-L5, involving the level above previous fusion.   PARTIAL HYSTERECTOMY     RADICULOPATHY Left    L4, Secondary to an L4-L5 Spondylolisthesis   SHOULDER SURGERY     Patient Active Problem List   Diagnosis Date Noted   Spondylosis without myelopathy or radiculopathy, lumbosacral region 06/26/2021   DDD (degenerative disc disease), lumbosacral 06/26/2021   Lumbar facet syndrome 05/14/2021   History of diabetic ketoacidosis 04/16/2021   Abnormal CT scan, lumbar spine (10/11/2020) 04/16/2021   Chronic pain syndrome 02/28/2021   Pharmacologic therapy 02/28/2021   Disorder of skeletal system 02/28/2021   Problems influencing health status 02/28/2021   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   History of lumbar fusion 02/28/2021   Osteoarthritis of glenohumeral joint (Right) 02/28/2021   Osteoarthritis of AC (acromioclavicular) joint (Right) 02/28/2021   Lumbosacral facet arthropathy (Multilevel) 02/28/2021   History of lumbar laminectomy (L4-S1) 02/28/2021   Osteoarthritis of knee (Left) 02/28/2021   Severe  obesity (BMI 35.0-39.9) with comorbidity ([place]) 02/28/2021   Marijuana use 02/28/2021   Chronic lower extremity pain (2ry area of Pain) (Bilateral) (L>R) 02/28/2021   Chronic hip pain (3ry area of Pain) (Bilateral) (L>R) 02/28/2021   Chronic groin pain (Left) 02/28/2021   Impaired range of motion of hip 02/28/2021   PAD (peripheral artery disease) ([place]) 02/12/2021   Peripheral arterial disease ([place]) 02/06/2021   Decreased dorsalis pedis pulse 01/19/2021   Diabetes mellitus ([place]) 01/19/2021   Bipolar disorder ([place]) 01/17/2021   Pseudoarthrosis of lumbar spine 12/01/2020   Chronic low back pain (1ry area of  Pain) (Bilateral) (L>R) w/o sciatica 09/06/2020   Pain in thoracic spine 08/03/2020   Ulnar 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 of left upper extremity 08/09/2019   Pincer nail deformity 05/19/2019   Excessive daytime sleepiness 03/15/2019   Type 2 diabetes mellitus with hyperglycemia, with long-term current use of insulin ([place]) 03/08/2019   Type 2 diabetes mellitus with diabetic polyneuropathy, with long-term current use of insulin ([place]) 03/08/2019   Diabetic neuropathy with neurologic complication ([place]) 11/14/5943   Claudication of both lower extremities ([place]) 02/04/2019   RLS (restless legs syndrome) 02/04/2019   Sleep deprivation 02/04/2019   Hot flashes 02/04/2019   Neck pain 02/04/2019   Chronic migraine without aura, with intractable migraine, so stated, with status migrainosus 01/27/2019   Chronic knee pain (4th area of Pain) (Bilateral) (L>R) 06/18/2018   Radiculopathy 02/11/2018   Diabetic ketoacidosis ([place]) 03/24/2016   Dermatitis 03/24/2016   HTN (hypertension) 03/24/2016   HLD (hyperlipidemia) 03/24/2016   DM (diabetes mellitus) ([place]) 03/24/2016   Chronic daily headache 11/23/2012   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    PCP: Flossie Buffy, NP  REFERRING PROVIDER: [place] Nakayama, MD  REFERRING DIAG: Low back pain and rib contusion  Rationale for Evaluation and Treatment Rehabilitation  THERAPY DIAG:  Other low back pain  Pain in left leg  Muscle weakness (generalized)  ONSET DATE: Ongoing for many years   SUBJECTIVE:          SUBJECTIVE STATEMENT: Patient reports lower spine has been giving her problems, and this has been causing pain down the left leg. She did have surgery in 2020, then more recently in November 2022, but in February 2023 they had to put new screws in because the screws were coming out. Sometimes when she is sitting she has to lean on the right side to avoid putting pressure on the left buttock. She very seldom sits at home,  but she can move certain ways like bending down and this will cause increased pain. Patient reports the pain will limit her walking ability and she can't do what she wants like go to the gym.  PERTINENT HISTORY:  Previous lumbar fusions,   PAIN:  Are you having pain? Yes:  NPRS scale: 4/10 Pain location: Left lower back, left buttocks and left leg Pain description: Stabbing, discomfort, aching, needles Aggravating factors: Sitting, bending, lifting, twisting Relieving factors: Ice/heat, medication, espson salt baths  PRECAUTIONS: None  WEIGHT BEARING RESTRICTIONS No  FALLS:  Has patient fallen in last 6 months? No  LIVING ENVIRONMENT: Lives with: lives alone Lives in: House/apartment Stairs: Yes: External: 6 steps; can reach both  OCCUPATION: Disability  PLOF: Independent  PATIENT GOALS: Pain relief, improve sitting ability   OBJECTIVE:  PATIENT SURVEYS:  FOTO 33% functional status  SCREENING FOR RED FLAGS: Negative  COGNITION:  Overall cognitive status: Within functional limits for tasks assessed     SENSATION: WFL,  patient reports tingling left leg but no sensation deficits noted  MUSCLE LENGTH: Left hip flexion limitation, piriformis limitation, hamstring grossly WFL  POSTURE:  Rounded shoulders  PALPATION: Tenderness generalized lumbar spine and left gluteal region  LUMBAR ROM:   Active  A/PROM  eval  Flexion Fingertips to toes, minimal lumbar flexion  Extension 50%  Right lateral flexion 75%  Left lateral flexion 75%  Right rotation 75%  Left rotation 75%   LOWER EXTREMITY ROM:      Hip PROM grossly WFL, patient does report anterior hip pain at end range flexion  LOWER EXTREMITY MMT:    MMT Right eval Left eval  Hip flexion 4- 4-  Hip extension 4- 4-  Hip abduction 4- 4-  Knee flexion 4 4-  Knee extension 4 4-   LUMBAR SPECIAL TESTS:  SLR positive on left  FUNCTIONAL TESTS:  Not assessed  GAIT: Assistive device utilized:  None Level of assistance: Complete Independence   TODAY'S TREATMENT  Piriformis stretch 2 x 30 sec each LTR 5 x 5 sec each Bridge 10 x 3 sec 90-90 tabletop hold 5 x 5 sec Clamshell with red 2 x 10  PATIENT EDUCATION:  Education details: Exam findings, POC, HEP Person educated: Patient Education method: Explanation, Demonstration, Tactile cues, Verbal cues, and Handouts Education comprehension: verbalized understanding, returned demonstration, verbal cues required, tactile cues required, and needs further education  HOME EXERCISE PROGRAM: Access Code: [email or web address]   ASSESSMENT: CLINICAL IMPRESSION: Patient is a 61 y.o. female who was seen today for physical therapy evaluation and treatment for chronic low back pain and left sided leg pain that seems radicular in nature. She demonstrates limitations in lumbar motion, flexibility deficits of the hips, gross strength deficits of the core and hips that are likely leading to limitations with activities and intolerance for sitting and standing tasks.    OBJECTIVE IMPAIRMENTS decreased activity tolerance, decreased ROM, decreased strength, impaired flexibility, postural dysfunction, and pain.   ACTIVITY LIMITATIONS carrying, lifting, bending, sitting, standing, squatting, hygiene/grooming, and locomotion level  PARTICIPATION LIMITATIONS: meal prep, cleaning, laundry, shopping, community activity, and yard work  [place], Past/current experiences, and Time since onset of injury/illness/exacerbation are also affecting patient's functional outcome.   REHAB POTENTIAL: Fair  CLINICAL DECISION MAKING: Stable/uncomplicated  EVALUATION COMPLEXITY: Low   GOALS: Goals reviewed with patient? Yes  SHORT TERM GOALS: Target date: 11/01/2021  Patient will be I with initial HEP in order to progress with therapy. Baseline: HEP provided at eval Goal status: INITIAL  2.  PT will review FOTO with patient by 3rd visit in order to  understand expected progress and outcome with therapy. Baseline: FOTO assessed at eval Goal status: INITIAL  LONG TERM GOALS: Target date: 11/29/2021  Patient will be I with final HEP to maintain progress from PT. Baseline: HEP provided at eval Goal status: INITIAL  2.  Patient will report >/= 44% status on FOTO to indicate improved functional ability. Baseline: 33% Goal status: INITIAL  3.  Patient will demonstrate >/= 4/5 MMT hip strength to improve standing and walking tolerance. Baseline: grossly 4-/5 MMT Goal status: INITIAL  4.  Patient will report pain level with activity </= 2/10 in order to reduce functional limitations Baseline: pain level with activity >/= 4/5 MMT Goal status: INITIAL   PLAN: PT FREQUENCY: 1x/week  PT DURATION: 8 weeks  PLANNED INTERVENTIONS: Therapeutic exercises, Therapeutic activity, Neuromuscular re-education, Balance training, Gait training, Patient/Family education, Self Care, Joint mobilization, Joint manipulation, Aquatic Therapy, Dry Needling,  Electrical stimulation, Cryotherapy, Moist heat, Manual therapy, and Re-evaluation.  PLAN FOR NEXT SESSION: Review HEP and progress PRN, LAD for left leg, progress core and hip strengthening   Hilda Blades, PT, DPT, LAT, ATC 10/04/21  4:21 PM Phone: [phone] Fax: [phone]

## 2021-10-04 NOTE — Patient Instructions (Signed)
Access Code: [email or web address] URL: [email or web address] Date: 10/04/2021 Prepared by: Hilda Blades  Exercises - Supine Piriformis Stretch with Foot on Ground  - 1 x daily - 3 sets - 30 seconds hold - Supine Lower Trunk Rotation  - 1 x daily - 10 reps - 5 seconds hold - Bridge  - 1 x daily - 2 sets - 10 reps - 3 seconds hold - Supine 90/90 Abdominal Bracing  - 1 x daily - 2 sets - 10 reps - 5 seconds hold - Clam with Resistance  - 1 x daily - 3 sets - 10 reps

## 2021-10-09 ENCOUNTER — Ambulatory Visit: Payer: Medicare HMO | Admitting: Physical Therapy

## 2021-10-09 ENCOUNTER — Encounter: Payer: Self-pay | Admitting: Physical Therapy

## 2021-10-09 DIAGNOSIS — M5459 Other low back pain: Secondary | ICD-10-CM | POA: Diagnosis not present

## 2021-10-09 DIAGNOSIS — M79605 Pain in left leg: Secondary | ICD-10-CM

## 2021-10-09 DIAGNOSIS — M6281 Muscle weakness (generalized): Secondary | ICD-10-CM | POA: Diagnosis not present

## 2021-10-09 NOTE — Therapy (Signed)
OUTPATIENT PHYSICAL THERAPY TREATMENT NOTE   Patient Name: Autumn Valenzuela MRN: [id number] DOB:Feb 04, 1960, 61 y.o., female Today's Date: 10/09/2021  PCP: Flossie Buffy, NP   REFERRING PROVIDER: [place] Nakayama, MD  END OF SESSION:   PT End of Session - 10/09/21 0804     Visit Number 2    Number of Visits 9    Date for PT Re-Evaluation 11/29/21    Authorization Type AETNA MEDICARE    Progress Note Due on Visit 10    PT Start Time 0800    PT Stop Time 0838    PT Time Calculation (min) 38 min             Past Medical History:  Diagnosis Date   Allergy    Anemia    Anxiety    Arthritis    Asthma    Back pain    Bipolar disorder ([place])    CVA (cerebral infarction)    Depression    Diabetes mellitus without complication (HCC)    High cholesterol    Hypertension    Left leg pain    Migraine    Sleep apnea    does not use a cpap   Stroke ([place]) 2007   no lasting weakness   Past Surgical History:  Procedure Laterality Date   ABDOMINAL AORTOGRAM W/LOWER EXTREMITY N/A 02/08/2021   Procedure: ABDOMINAL AORTOGRAM W/LOWER EXTREMITY;  Surgeon: Marty Heck, MD;  Location: [place] [facility];  Service: Cardiovascular;  Laterality: N/A;   ABDOMINAL HYSTERECTOMY     ANTERIOR LATERAL LUMBAR FUSION WITH PERCUTANEOUS SCREW 1 LEVEL Left 02/11/2018   Procedure: LEFT LATERAL LUMBAR  FOUR-FIVE INTERBODY FUSION WITH INSTRUMENTATION AND ALLOGRAFT;  Surgeon: Phylliss Bob, MD;  Location: [place];  Service: Orthopedics;  Laterality: Left;  LEFT LATERAL LUMBAR  FOUR-FIVE INTERBODY FUSION WITH INSTRUMENTATION AND ALLOGRAFT   BACK SURGERY     Lumbar fusion L5-S1   DIABETES     ENDARTERECTOMY FEMORAL Left 02/12/2021   Procedure: LEFT COMMON FEMORAL ENDARTERECTOMY WITH BOVINE PATCH;  Surgeon: Marty Heck, MD;  Location: [place];  Service: Vascular;  Laterality: Left;   EYE SURGERY Bilateral    INGUINAL LYMPH NODE BIOPSY Left 07/25/2021   Procedure: EXCISIONAL BIOPSY LEFT  INGUINAL LYMPH NODES;  Surgeon: Coralie Keens, MD;  Location: [place];  Service: General;  Laterality: Left;   KNEE SURGERY     l 4-l5 status post lateral posterior fusion January 30,2020     NEUROFORAMINAL STENOSIS Left    Severe L4-L5, involving the level above previous fusion.   PARTIAL HYSTERECTOMY     RADICULOPATHY Left    L4, Secondary to an L4-L5 Spondylolisthesis   SHOULDER SURGERY     Patient Active Problem List   Diagnosis Date Noted   Spondylosis without myelopathy or radiculopathy, lumbosacral region 06/26/2021   DDD (degenerative disc disease), lumbosacral 06/26/2021   Lumbar facet syndrome 05/14/2021   History of diabetic ketoacidosis 04/16/2021   Abnormal CT scan, lumbar spine (10/11/2020) 04/16/2021   Chronic pain syndrome 02/28/2021   Pharmacologic therapy 02/28/2021   Disorder of skeletal system 02/28/2021   Problems influencing health status 02/28/2021   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   History of lumbar fusion 02/28/2021   Osteoarthritis of glenohumeral joint (Right) 02/28/2021   Osteoarthritis of AC (acromioclavicular) joint (Right) 02/28/2021   Lumbosacral facet arthropathy (Multilevel) 02/28/2021   History of lumbar laminectomy (L4-S1) 02/28/2021   Osteoarthritis of knee (Left) 02/28/2021   Severe obesity (  BMI 35.0-39.9) with comorbidity ([place]) 02/28/2021   Marijuana use 02/28/2021   Chronic lower extremity pain (2ry area of Pain) (Bilateral) (L>R) 02/28/2021   Chronic hip pain (3ry area of Pain) (Bilateral) (L>R) 02/28/2021   Chronic groin pain (Left) 02/28/2021   Impaired range of motion of hip 02/28/2021   PAD (peripheral artery disease) ([place]) 02/12/2021   Peripheral arterial disease ([place]) 02/06/2021   Decreased dorsalis pedis pulse 01/19/2021   Diabetes mellitus ([place]) 01/19/2021   Bipolar disorder ([place]) 01/17/2021   Pseudoarthrosis of lumbar spine 12/01/2020   Chronic low back pain (1ry area of Pain) (Bilateral) (L>R)  w/o sciatica 09/06/2020   Pain in thoracic spine 08/03/2020   Ulnar 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 of left upper extremity 08/09/2019   Pincer nail deformity 05/19/2019   Excessive daytime sleepiness 03/15/2019   Type 2 diabetes mellitus with hyperglycemia, with long-term current use of insulin ([place]) 03/08/2019   Type 2 diabetes mellitus with diabetic polyneuropathy, with long-term current use of insulin ([place]) 03/08/2019   Diabetic neuropathy with neurologic complication ([place]) 62/69/4854   Claudication of both lower extremities ([place]) 02/04/2019   RLS (restless legs syndrome) 02/04/2019   Sleep deprivation 02/04/2019   Hot flashes 02/04/2019   Neck pain 02/04/2019   Chronic migraine without aura, with intractable migraine, so stated, with status migrainosus 01/27/2019   Chronic knee pain (4th area of Pain) (Bilateral) (L>R) 06/18/2018   Radiculopathy 02/11/2018   Diabetic ketoacidosis ([place]) 03/24/2016   Dermatitis 03/24/2016   HTN (hypertension) 03/24/2016   HLD (hyperlipidemia) 03/24/2016   DM (diabetes mellitus) ([place]) 03/24/2016   Chronic daily headache 11/23/2012   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    REFERRING DIAG: Low back pain and rib contusion   THERAPY DIAG:  Other low back pain  Pain in left leg  Muscle weakness (generalized)  Rationale for Evaluation and Treatment Rehabilitation  PERTINENT HISTORY: Previous lumbar fusions   PRECAUTIONS: none  SUBJECTIVE: I was in 10/10 pain after the last visit. I did not complete my exercises.   PAIN:  Are you having pain? Yes:  NPRS scale: 7/10 Pain location: Left lower back, left buttocks and left leg Pain description: Stabbing, discomfort, aching, needles Aggravating factors: Sitting, bending, lifting, twisting Relieving factors: Ice/heat, medication, espson salt baths   OBJECTIVE: (objective measures completed at initial evaluation unless otherwise dated)   PATIENT SURVEYS:  FOTO 33% functional  status   SCREENING FOR RED FLAGS: Negative   COGNITION:           Overall cognitive status: Within functional limits for tasks assessed                          SENSATION: WFL, patient reports tingling left leg but no sensation deficits noted   MUSCLE LENGTH: Left hip flexion limitation, piriformis limitation, hamstring grossly WFL   POSTURE:  Rounded shoulders   PALPATION: Tenderness generalized lumbar spine and left gluteal region   LUMBAR ROM:    Active  A/PROM  eval  Flexion Fingertips to toes, minimal lumbar flexion  Extension 50%  Right lateral flexion 75%  Left lateral flexion 75%  Right rotation 75%  Left rotation 75%    LOWER EXTREMITY ROM:                          Hip PROM grossly WFL, patient does report anterior hip pain at end range flexion   LOWER EXTREMITY MMT:  MMT Right eval Left eval  Hip flexion 4- 4-  Hip extension 4- 4-  Hip abduction 4- 4-  Knee flexion 4 4-  Knee extension 4 4-    LUMBAR SPECIAL TESTS:  SLR positive on left   FUNCTIONAL TESTS:  Not assessed   GAIT: Assistive device utilized: None Level of assistance: Complete Independence   TODAY's TREATMENT:  [facility] Adult PT Treatment:                                                DATE: 10/09/21 Therapeutic Exercise: Nustep L 5 UE/LE x 5 minutes  Seated EOM hamstring stretch 3 x 20 sec  Bridge 5 sec x 10  Figure 4 push and pull Supine hamstring stretch  Side clam red x 15 each LTR 90/90 ab brace 5 sec x 15 Hip flexor stretch EOM with knee flexion Manual:  LAD LLE x 3      INITIAL TREATMENT  Piriformis stretch 2 x 30 sec each LTR 5 x 5 sec each Bridge 10 x 3 sec 90-90 tabletop hold 5 x 5 sec Clamshell with red 2 x 10   PATIENT EDUCATION:  Education details: Exam findings, POC, HEP Person educated: Patient Education method: Explanation, Demonstration, Tactile cues, Verbal cues, and Handouts Education comprehension: verbalized understanding, returned  demonstration, verbal cues required, tactile cues required, and needs further education   HOME EXERCISE PROGRAM: Access Code: [email or web address]     ASSESSMENT: CLINICAL IMPRESSION: Patient is a 61 y.o. female who was seen today for physical therapy treatment for chronic low back pain and left sided leg pain that seems radicular in nature. She reports increased pain of 10/10 in low back the day after her last visits that limited her ability to complete ADLS like sweeping and mopping. Today she rates her pain at 7/10. Time spent with review of HEP and progression of left hip  flexibility. FOTO report reviewed.  She has not been compliant with HEP due to increased pain. No additions to HEP today.  She felt relief with LLE LAD. She felt good at end of session.      OBJECTIVE IMPAIRMENTS decreased activity tolerance, decreased ROM, decreased strength, impaired flexibility, postural dysfunction, and pain.    ACTIVITY LIMITATIONS carrying, lifting, bending, sitting, standing, squatting, hygiene/grooming, and locomotion level   PARTICIPATION LIMITATIONS: meal prep, cleaning, laundry, shopping, community activity, and yard work   [place], Past/current experiences, and Time since onset of injury/illness/exacerbation are also affecting patient's functional outcome.    REHAB POTENTIAL: Fair   CLINICAL DECISION MAKING: Stable/uncomplicated   EVALUATION COMPLEXITY: Low     GOALS: Goals reviewed with patient? Yes   SHORT TERM GOALS: Target date: 11/01/2021   Patient will be I with initial HEP in order to progress with therapy. Baseline: HEP provided at eval Goal status: ONGOING   2.  PT will review FOTO with patient by 3rd visit in order to understand expected progress and outcome with therapy. Baseline: FOTO assessed at eval Goal status: MET   LONG TERM GOALS: Target date: 11/29/2021   Patient will be I with final HEP to maintain progress from PT. Baseline: HEP provided at  eval Goal status: INITIAL   2.  Patient will report >/= 44% status on FOTO to indicate improved functional ability. Baseline: 33% Goal status: INITIAL   3.  Patient will demonstrate >/=  4/5 MMT hip strength to improve standing and walking tolerance. Baseline: grossly 4-/5 MMT Goal status: INITIAL   4.  Patient will report pain level with activity </= 2/10 in order to reduce functional limitations Baseline: pain level with activity >/= 4/5 MMT Goal status: INITIAL     PLAN: PT FREQUENCY: 1x/week   PT DURATION: 8 weeks   PLANNED INTERVENTIONS: Therapeutic exercises, Therapeutic activity, Neuromuscular re-education, Balance training, Gait training, Patient/Family education, Self Care, Joint mobilization, Joint manipulation, Aquatic Therapy, Dry Needling, Electrical stimulation, Cryotherapy, Moist heat, Manual therapy, and Re-evaluation.   PLAN FOR NEXT SESSION: Review HEP and progress PRN, LAD for left leg, progress core and hip strengthening, FOTO 6th visit        Hessie Diener, PTA 10/09/21 8:41 AM Phone: [phone] Fax: [phone]

## 2021-10-11 ENCOUNTER — Telehealth: Payer: Self-pay | Admitting: Nurse Practitioner

## 2021-10-11 NOTE — Telephone Encounter (Signed)
Left message for patient to call back and schedule Medicare Annual Wellness Visit (AWV).   Please offer to do virtually or by telephone.  Left office number and my jabber [phone].  Last AWV:10/24/2020  Please schedule at anytime with Nurse Health Advisor.

## 2021-10-15 NOTE — Therapy (Signed)
OUTPATIENT PHYSICAL THERAPY TREATMENT NOTE   Patient Name: Autumn Valenzuela MRN: [id number] DOB:1960/03/14, 61 y.o., female Today's Date: 10/16/2021  PCP: Flossie Buffy, NP   REFERRING PROVIDER: Melrose Nakayama, MD   END OF SESSION:   PT End of Session - 10/16/21 0833     Visit Number 3    Number of Visits 9    Date for PT Re-Evaluation 11/29/21    Authorization Type [employer] MEDICARE    Progress Note Due on Visit 10    PT Start Time 0833    PT Stop Time 0911    PT Time Calculation (min) 38 min    Activity Tolerance Patient tolerated treatment well    Behavior During Therapy [facility] for tasks assessed/performed              Past Medical History:  Diagnosis Date   Allergy    Anemia    Anxiety    Arthritis    Asthma    Back pain    Bipolar disorder ([place])    CVA (cerebral infarction)    Depression    Diabetes mellitus without complication ([place])    High cholesterol    Hypertension    Left leg pain    Migraine    Sleep apnea    does not use a cpap   Stroke ([place]) 2007   no lasting weakness   Past Surgical History:  Procedure Laterality Date   ABDOMINAL AORTOGRAM W/LOWER EXTREMITY N/A 02/08/2021   Procedure: ABDOMINAL AORTOGRAM W/LOWER EXTREMITY;  Surgeon: Marty Heck, MD;  Location: [facility];  Service: Cardiovascular;  Laterality: N/A;   ABDOMINAL HYSTERECTOMY     ANTERIOR LATERAL LUMBAR FUSION WITH PERCUTANEOUS SCREW 1 LEVEL Left 02/11/2018   Procedure: LEFT LATERAL LUMBAR  FOUR-FIVE INTERBODY FUSION WITH INSTRUMENTATION AND ALLOGRAFT;  Surgeon: Phylliss Bob, MD;  Location: [place];  Service: Orthopedics;  Laterality: Left;  LEFT LATERAL LUMBAR  FOUR-FIVE INTERBODY FUSION WITH INSTRUMENTATION AND ALLOGRAFT   BACK SURGERY     Lumbar fusion L5-S1   DIABETES     ENDARTERECTOMY FEMORAL Left 02/12/2021   Procedure: LEFT COMMON FEMORAL ENDARTERECTOMY WITH BOVINE PATCH;  Surgeon: Marty Heck, MD;  Location: [place];  Service: Vascular;   Laterality: Left;   EYE SURGERY Bilateral    INGUINAL LYMPH NODE BIOPSY Left 07/25/2021   Procedure: EXCISIONAL BIOPSY LEFT INGUINAL LYMPH NODES;  Surgeon: Coralie Keens, MD;  Location: [place];  Service: General;  Laterality: Left;   KNEE SURGERY     l 4-l5 status post lateral posterior fusion January 30,2020     NEUROFORAMINAL STENOSIS Left    Severe L4-L5, involving the level above previous fusion.   PARTIAL HYSTERECTOMY     RADICULOPATHY Left    L4, Secondary to an L4-L5 Spondylolisthesis   SHOULDER SURGERY     Patient Active Problem List   Diagnosis Date Noted   Spondylosis without myelopathy or radiculopathy, lumbosacral region 06/26/2021   DDD (degenerative disc disease), lumbosacral 06/26/2021   Lumbar facet syndrome 05/14/2021   History of diabetic ketoacidosis 04/16/2021   Abnormal CT scan, lumbar spine (10/11/2020) 04/16/2021   Chronic pain syndrome 02/28/2021   Pharmacologic therapy 02/28/2021   Disorder of skeletal system 02/28/2021   Problems influencing health status 02/28/2021   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   History of lumbar fusion 02/28/2021   Osteoarthritis of glenohumeral joint (Right) 02/28/2021   Osteoarthritis of AC (acromioclavicular) joint (Right) 02/28/2021   Lumbosacral facet arthropathy (  Multilevel) 02/28/2021   History of lumbar laminectomy (L4-S1) 02/28/2021   Osteoarthritis of knee (Left) 02/28/2021   Severe obesity (BMI 35.0-39.9) with comorbidity ([place]) 02/28/2021   Marijuana use 02/28/2021   Chronic lower extremity pain (2ry area of Pain) (Bilateral) (L>R) 02/28/2021   Chronic hip pain (3ry area of Pain) (Bilateral) (L>R) 02/28/2021   Chronic groin pain (Left) 02/28/2021   Impaired range of motion of hip 02/28/2021   PAD (peripheral artery disease) ([place]) 02/12/2021   Peripheral arterial disease ([place]) 02/06/2021   Decreased dorsalis pedis pulse 01/19/2021   Diabetes mellitus ([place]) 01/19/2021   Bipolar disorder  ([place]) 01/17/2021   Pseudoarthrosis of lumbar spine 12/01/2020   Chronic low back pain (1ry area of Pain) (Bilateral) (L>R) w/o sciatica 09/06/2020   Pain in thoracic spine 08/03/2020   Ulnar 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 of left upper extremity 08/09/2019   Pincer nail deformity 05/19/2019   Excessive daytime sleepiness 03/15/2019   Type 2 diabetes mellitus with hyperglycemia, with long-term current use of insulin ([place]) 03/08/2019   Type 2 diabetes mellitus with diabetic polyneuropathy, with long-term current use of insulin ([place]) 03/08/2019   Diabetic neuropathy with neurologic complication ([place]) 96/28/3662   Claudication of both lower extremities ([place]) 02/04/2019   RLS (restless legs syndrome) 02/04/2019   Sleep deprivation 02/04/2019   Hot flashes 02/04/2019   Neck pain 02/04/2019   Chronic migraine without aura, with intractable migraine, so stated, with status migrainosus 01/27/2019   Chronic knee pain (4th area of Pain) (Bilateral) (L>R) 06/18/2018   Radiculopathy 02/11/2018   Diabetic ketoacidosis () 03/24/2016   Dermatitis 03/24/2016   HTN (hypertension) 03/24/2016   HLD (hyperlipidemia) 03/24/2016   DM (diabetes mellitus) ([place]) 03/24/2016   Chronic daily headache 11/23/2012   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    REFERRING DIAG: Low back pain and rib contusion   THERAPY DIAG:  Other low back pain  Pain in left leg  Muscle weakness (generalized)  Rationale for Evaluation and Treatment Rehabilitation  PERTINENT HISTORY: Previous lumbar fusions   PRECAUTIONS: None   SUBJECTIVE: Patient reports the day after she does her exercises she is very sore.   PAIN:  Are you having pain? Yes:  NPRS scale: 7/10 Pain location: Left lower back, left buttocks and left leg Pain description: Stabbing, discomfort, aching, needles Aggravating factors: Sitting, bending, lifting, twisting Relieving factors: Ice/heat, medication, espson salt baths   OBJECTIVE:  (objective measures completed at initial evaluation unless otherwise dated) PATIENT SURVEYS:  FOTO 33% functional status   SENSATION: WFL, patient reports tingling left leg but no sensation deficits noted   MUSCLE LENGTH: Left hip flexion limitation, piriformis limitation, hamstring grossly WFL   POSTURE:  Rounded shoulders   PALPATION: Tenderness generalized lumbar spine and left gluteal region   LUMBAR ROM:    Active  A/PROM  eval  Flexion Fingertips to toes, minimal lumbar flexion  Extension 50%  Right lateral flexion 75%  Left lateral flexion 75%  Right rotation 75%  Left rotation 75%    LOWER EXTREMITY ROM:                          Hip PROM grossly WFL, patient does report anterior hip pain at end range flexion   LOWER EXTREMITY MMT:     MMT Right eval Left eval Rt / Lt 10/16/2021  Hip flexion 4- 4- 4- / 4-  Hip extension 4- 4- 4- / 4-  Hip abduction 4- 4- 4- /  4-  Knee flexion 4 4-   Knee extension 4 4-     LUMBAR SPECIAL TESTS:  SLR positive on left   FUNCTIONAL TESTS:  Not assessed   GAIT: Assistive device utilized: None Level of assistance: Complete Independence    TODAY's TREATMENT:  [facility] Adult PT Treatment:                                                DATE: 10/16/21 Therapeutic Exercise: Nustep L6 x 5 min with UE/LE while taking subjective Seated lumbar flexion physioball rollout 10 x 5 sec Piriformis stretch 3 x 20 sec each SLR 2 x 10 each Bridge 2 x 6 with 5 sec hold Hooklying unilateral clamshell with red 2 x 5 each PPT with alternating march 2 x 10 each Sidelying hip abduction 2 x 10 each Manual:  Left LAD with gentle oscillations x 3 bouts   [facility] Adult PT Treatment:                                                DATE: 10/09/21 Therapeutic Exercise: Nustep L 5 UE/LE x 5 minutes  Seated EOM hamstring stretch 3 x 20 sec  Bridge 5 sec x 10  Figure 4 push and pull Supine hamstring stretch  Side clam red x 15 each LTR 90/90 ab  brace 5 sec x 15 Hip flexor stretch EOM with knee flexion Manual:  LAD LLE x 3   [facility] Adult PT Treatment:                                                DATE: 10/04/21 Therapeutic Exercise: Piriformis stretch 2 x 30 sec each LTR 5 x 5 sec each Bridge 10 x 3 sec 90-90 tabletop hold 5 x 5 sec Clamshell with red 2 x 10   PATIENT EDUCATION:  Education details: HEP update Person educated: Patient Education method: Explanation, Demonstration, Tactile cues, Verbal cues, and Handouts Education comprehension: verbalized understanding, returned demonstration, verbal cues required, tactile cues required, and needs further education   HOME EXERCISE PROGRAM: Access Code: [email or web address]     ASSESSMENT: CLINICAL IMPRESSION: Patient will fair tolerance for therapy this visit, no adverse effects reported. Therapy focused on continued lumbar mobility and progression of core and hip strengthening. She reports significant soreness following exercises so continued with mat based exercises and provided time for rest breaks as needed. She does continue to exhibit gross strength deficits with greater difficulty on the left side. She continues to have relief with LAD so performed at end of session to reduce pain. Updated HEP with good tolerance. Patient would benefit from continued skilled PT to progress her mobility and strength in order to reduce pain and maximize functional ability.     OBJECTIVE IMPAIRMENTS decreased activity tolerance, decreased ROM, decreased strength, impaired flexibility, postural dysfunction, and pain.    ACTIVITY LIMITATIONS carrying, lifting, bending, sitting, standing, squatting, hygiene/grooming, and locomotion level   PARTICIPATION LIMITATIONS: meal prep, cleaning, laundry, shopping, community activity, and yard work   [place], Past/current experiences, and Time since onset of injury/illness/exacerbation are  also affecting patient's functional outcome.       GOALS: Goals reviewed with patient? Yes   SHORT TERM GOALS: Target date: 11/01/2021   Patient will be I with initial HEP in order to progress with therapy. Baseline: HEP provided at eval Goal status: ONGOING   2.  PT will review FOTO with patient by 3rd visit in order to understand expected progress and outcome with therapy. Baseline: FOTO assessed at eval Goal status: MET   LONG TERM GOALS: Target date: 11/29/2021   Patient will be I with final HEP to maintain progress from PT. Baseline: HEP provided at eval Goal status: INITIAL   2.  Patient will report >/= 44% status on FOTO to indicate improved functional ability. Baseline: 33% Goal status: INITIAL   3.  Patient will demonstrate >/= 4/5 MMT hip strength to improve standing and walking tolerance. Baseline: grossly 4-/5 MMT Goal status: INITIAL   4.  Patient will report pain level with activity </= 2/10 in order to reduce functional limitations Baseline: pain level with activity >/= 4/5 MMT Goal status: INITIAL     PLAN: PT FREQUENCY: 1x/week   PT DURATION: 8 weeks   PLANNED INTERVENTIONS: Therapeutic exercises, Therapeutic activity, Neuromuscular re-education, Balance training, Gait training, Patient/Family education, Self Care, Joint mobilization, Joint manipulation, Aquatic Therapy, Dry Needling, Electrical stimulation, Cryotherapy, Moist heat, Manual therapy, and Re-evaluation.   PLAN FOR NEXT SESSION: Review HEP and progress PRN, LAD for left leg, progress core and hip strengthening, FOTO 6th visit        Hilda Blades, PT, DPT, LAT, ATC 10/16/21  9:30 AM Phone: [phone] Fax: [phone]

## 2021-10-16 ENCOUNTER — Other Ambulatory Visit: Payer: Self-pay

## 2021-10-16 ENCOUNTER — Ambulatory Visit: Payer: Medicare HMO | Attending: Orthopaedic Surgery | Admitting: Physical Therapy

## 2021-10-16 ENCOUNTER — Encounter: Payer: Self-pay | Admitting: Physical Therapy

## 2021-10-16 DIAGNOSIS — M778 Other enthesopathies, not elsewhere classified: Secondary | ICD-10-CM | POA: Diagnosis not present

## 2021-10-16 DIAGNOSIS — M5459 Other low back pain: Secondary | ICD-10-CM | POA: Insufficient documentation

## 2021-10-16 DIAGNOSIS — M6281 Muscle weakness (generalized): Secondary | ICD-10-CM | POA: Insufficient documentation

## 2021-10-16 DIAGNOSIS — M79605 Pain in left leg: Secondary | ICD-10-CM | POA: Diagnosis not present

## 2021-10-16 NOTE — Patient Instructions (Signed)
Access Code: [email or web address] URL: [email or web address] Date: 10/16/2021 Prepared by: Hilda Blades  Exercises - Supine Piriformis Stretch with Foot on Ground  - 1 x daily - 3 sets - 30 seconds hold - Supine Lower Trunk Rotation  - 1 x daily - 10 reps - 5 seconds hold - Bridge  - 1 x daily - 2 sets - 10 reps - 3 seconds hold - Supine 90/90 Abdominal Bracing  - 1 x daily - 2 sets - 10 reps - 5 seconds hold - Clam with Resistance  - 1 x daily - 3 sets - 10 reps - Active Straight Leg Raise with Quad Set  - 1 x daily - 2 sets - 10 reps

## 2021-10-17 DIAGNOSIS — M25632 Stiffness of left wrist, not elsewhere classified: Secondary | ICD-10-CM | POA: Diagnosis not present

## 2021-10-17 DIAGNOSIS — G5602 Carpal tunnel syndrome, left upper limb: Secondary | ICD-10-CM | POA: Diagnosis not present

## 2021-10-17 DIAGNOSIS — T7840XS Allergy, unspecified, sequela: Secondary | ICD-10-CM | POA: Diagnosis not present

## 2021-10-17 DIAGNOSIS — R29898 Other symptoms and signs involving the musculoskeletal system: Secondary | ICD-10-CM | POA: Diagnosis not present

## 2021-10-17 DIAGNOSIS — M778 Other enthesopathies, not elsewhere classified: Secondary | ICD-10-CM | POA: Diagnosis not present

## 2021-10-17 DIAGNOSIS — G5622 Lesion of ulnar nerve, left upper limb: Secondary | ICD-10-CM | POA: Diagnosis not present

## 2021-10-18 ENCOUNTER — Telehealth: Payer: Self-pay | Admitting: Nurse Practitioner

## 2021-10-18 ENCOUNTER — Ambulatory Visit (INDEPENDENT_AMBULATORY_CARE_PROVIDER_SITE_OTHER): Payer: Medicare HMO

## 2021-10-18 DIAGNOSIS — M5137 Other intervertebral disc degeneration, lumbosacral region: Secondary | ICD-10-CM

## 2021-10-18 DIAGNOSIS — E1140 Type 2 diabetes mellitus with diabetic neuropathy, unspecified: Secondary | ICD-10-CM

## 2021-10-18 DIAGNOSIS — G894 Chronic pain syndrome: Secondary | ICD-10-CM

## 2021-10-18 DIAGNOSIS — Z794 Long term (current) use of insulin: Secondary | ICD-10-CM

## 2021-10-18 MED ORDER — PREGABALIN 75 MG PO CAPS: 75.0000 mg | ORAL_CAPSULE | Freq: Two times a day (BID) | ORAL | 5 refills | Status: DC

## 2021-10-18 NOTE — Progress Notes (Signed)
Chronic Care Management Pharmacy Note  10/18/2021 Name:  Autumn Valenzuela MRN:  [id number] DOB:  1960-09-14  Summary: Patient presents for CCM follow-up.   -Patient reports continued frustration with her pain control. Finds gabapentin ineffective. Sometimes skips medication due to concerns with pain tolerance. Also uses OTC ibuprofen and acetaminophen.   -Hypoglycemia improved overall. Reports hypoglycemic symptoms: 1x weekly. She has her dexcom alarm set for <60, discussed changing settings back to alert if less than 70.   Recommendations/Changes made from today's visit: Continue current medications -Could consider trial of pregabalin in place of gabapentin.   Plan: CPP follow-up 6 months   Subjective: Autumn Valenzuela is an 61 y.o. year old female who is a primary patient of Nche, Charlene Brooke, NP.  The CCM team was consulted for assistance with disease management and care coordination needs.    Engaged with patient by telephone for follow up visit in response to provider referral for pharmacy case management and/or care coordination services.   Consent to Services:  The patient was given information about Chronic Care Management services, agreed to services, and gave verbal consent prior to initiation of services.  Please see initial visit note for detailed documentation.   Patient Care Team: Nche, Charlene Brooke, NP as PCP - General (Internal Medicine) Germaine Pomfret, [facility] as Pharmacist (Pharmacist)  Recent office visits: 08/27/21: Patient presented to Wilfred Lacy, NP for follow-up.  06/27/21: Patient presented to Wilfred Lacy, NP for follow-up.  05/30/21: Patient presented to Wilfred Lacy, NP for follow-up. Duloxetine 60 mg twice daily  05/02/21: Patient presented to Wilfred Lacy, NP for chronic pain. Gabapentin 300 mg TID, Bupropion 75 mg twice daily.  04/02/2021 Vance Peper NP (PCP Office) Start Doxycycline 100 mg 2 times daily 01/17/2021 Wilfred Lacy NP (PCP)  Change Gabapentin 300 mg 2 times daily, Follow up 6 months  Recent consult visits: 09/24/21: Patient presented to Dr. Kelton Pillar (Endocrinology). A1c 5.8%.   05/18/2021 Dr. Kelton Pillar MD (Endocrinology): ozempic 0.5 mg weekly   Hospital visits: None in past 6 months   Objective:  Lab Results  Component Value Date   CREATININE 0.73 07/25/2021   BUN 9 07/25/2021   GFR 95.70 01/17/2021   GFRNONAA >60 07/25/2021   GFRAA >60 02/09/2018   NA 143 07/25/2021   K 3.7 07/25/2021   CALCIUM 9.2 07/25/2021   CO2 22 07/25/2021   GLUCOSE 98 07/25/2021    Lab Results  Component Value Date/Time   HGBA1C 5.8 (A) 09/24/2021 08:11 AM   HGBA1C 9.2 (A) 05/18/2021 08:11 AM   HGBA1C 8.7 (H) 02/12/2021 01:14 PM   HGBA1C 7.9 (H) 11/28/2020 09:30 AM   GFR 95.70 01/17/2021 08:36 AM   GFR 92.44 09/05/2020 09:39 AM   MICROALBUR 1.4 01/17/2021 08:36 AM   MICROALBUR <0.7 09/22/2019 01:27 PM    Last diabetic Eye exam:  Lab Results  Component Value Date/Time   HMDIABEYEEXA No Retinopathy 08/02/2021 12:00 AM    Last diabetic Foot exam: No results found for: "HMDIABFOOTEX"   Lab Results  Component Value Date   CHOL 175 02/13/2021   HDL 37 (L) 02/13/2021   LDLCALC 123 (H) 02/13/2021   LDLDIRECT 123.0 08/27/2021   TRIG 75 02/13/2021   CHOLHDL 4.7 02/13/2021       Latest Ref Rng & Units 08/27/2021   11:33 AM 02/12/2021    8:21 AM 01/17/2021    8:36 AM  Hepatic Function  Total Protein 6.0 - 8.3 g/dL 7.4  6.6  7.1  Albumin 3.5 - 5.2 g/dL 4.2  3.4  4.0   AST 0 - 37 U/L '11  10  10   ' ALT 0 - 35 U/L '8  10  9   ' Alk Phosphatase 39 - 117 U/L 59  57  63   Total Bilirubin 0.2 - 1.2 mg/dL 0.3  0.3  0.4   Bilirubin, Direct 0.0 - 0.3 mg/dL 0.1   0.1     Lab Results  Component Value Date/Time   TSH 0.51 02/04/2019 02:45 PM   TSH 0.43 04/30/2017 09:02 AM   FREET4 1.08 03/24/2016 11:59 AM       Latest Ref Rng & Units 05/02/2021   10:10 AM 04/02/2021    3:37 PM 02/13/2021    2:50 AM  CBC  WBC 4.0 -  10.5 K/uL 10.7  14.5  12.7   Hemoglobin 12.0 - 15.0 g/dL 12.9  12.4  10.5   Hematocrit 36.0 - 46.0 % 40.3  38.9  32.4   Platelets 150.0 - 400.0 K/uL 224.0  229.0  197     Lab Results  Component Value Date/Time   VD25OH 31.23 01/17/2021 08:36 AM   VD25OH 47.98 09/22/2019 01:27 PM    Clinical ASCVD: No  The 10-year ASCVD risk score (Arnett DK, et al., 2019) is: 9%   Values used to calculate the score:     Age: 73 years     Sex: Female     Is Non-Hispanic African American: Yes     Diabetic: Yes     Tobacco smoker: No     Systolic Blood Pressure: 685 mmHg     Is BP treated: No     HDL Cholesterol: 37 mg/dL     Total Cholesterol: 175 mg/dL       08/27/2021   11:35 AM 06/27/2021   10:40 AM 05/30/2021   10:05 AM  Depression screen PHQ 2/9  Decreased Interest 1 1 0  Down, Depressed, Hopeless '1 1 2  ' PHQ - 2 Score '2 2 2  ' Altered sleeping '3 1 3  ' Tired, decreased energy '3 1 1  ' Change in appetite '1 1 1  ' Feeling bad or failure about yourself  '1 1 1  ' Trouble concentrating '2 1 1  ' Moving slowly or fidgety/restless '3 1 1  ' Suicidal thoughts '1 1 1  ' PHQ-9 Score '16 9 11  ' Difficult doing work/chores Somewhat difficult Somewhat difficult Somewhat difficult     Social History   Tobacco Use  Smoking Status Former   Types: Cigarettes   Quit date: 12/13/2015   Years since quitting: 5.8  Smokeless Tobacco Never   BP Readings from Last 3 Encounters:  09/24/21 126/88  08/28/21 90/68  08/27/21 98/62   Pulse Readings from Last 3 Encounters:  08/27/21 68  08/07/21 95  07/25/21 71   Wt Readings from Last 3 Encounters:  09/24/21 196 lb (88.9 kg)  08/28/21 192 lb (87.1 kg)  08/27/21 192 lb 3.2 oz (87.2 kg)   BMI Readings from Last 3 Encounters:  09/24/21 37.03 kg/m  08/28/21 36.28 kg/m  08/27/21 36.32 kg/m    Assessment/Interventions: Review of patient past medical history, allergies, medications, health status, including review of consultants reports, laboratory and other test  data, was performed as part of comprehensive evaluation and provision of chronic care management services.   SDOH:  (Social Determinants of Health) assessments and interventions performed: Yes SDOH Interventions    Flowsheet Row Chronic Care Management from 05/03/2021 in [place]  from 10/24/2020 in [place] Interventions -- Intervention Not Indicated  Housing Interventions -- Intervention Not Indicated  Transportation Interventions -- Intervention Not Indicated  Depression Interventions/Treatment  -- Patient refuses Treatment  [patient will speak to her doctor  at her appointment]  Financial Strain Interventions Intervention Not Indicated Intervention Not Indicated  Physical Activity Interventions -- Intervention Not Indicated  Stress Interventions -- Provide Counseling, Intervention Not Indicated       SDOH Screenings   Food Insecurity: No Food Insecurity (10/24/2020)  Housing: Low Risk  (10/24/2020)  Transportation Needs: No Transportation Needs (10/24/2020)  Alcohol Screen: Low Risk  (10/24/2020)  Depression (PHQ2-9): High Risk (08/27/2021)  Financial Resource Strain: Low Risk  (05/03/2021)  Physical Activity: Inactive (10/24/2020)  Social Connections: Socially Isolated (10/24/2020)  Stress: Stress Concern Present (10/24/2020)  Tobacco Use: Medium Risk (10/16/2021)    [place]  No Known Allergies  Medications Reviewed Today     Reviewed by Bobette Mo, PT (Physical Therapist) on 10/16/21 at [phone]  Med List Status: <None>   Medication Order Taking? Sig Documenting Provider Last Dose Status Informant  Accu-Chek FastClix Lancets MISC [id number] No TEST 4 TIMES DAILY. DX E11.9 Nche, Charlene Brooke, NP Taking Active Self  albuterol (PROVENTIL) (2.5 MG/3ML) 0.083% nebulizer solution [id number] No Take 2.5 mg by nebulization every 6 (six) hours as needed for wheezing.  [provider] Taking Active Self  albuterol (VENTOLIN HFA) 108 (90 Base) MCG/ACT inhaler [id number] No Inhale 2 puffs into the lungs daily as needed for wheezing or shortness of breath. McElwee, Lauren A, NP Taking Active   aspirin EC 81 MG EC tablet [id number] No Take 1 tablet (81 mg total) by mouth daily at 6 (six) AM. Swallow whole. Baglia, Corrina, PA-C Taking Active   Blood Glucose Calibration (ACCU-CHEK GUIDE CONTROL) LIQD [id number] No 1 each by In Vitro route every 30 (thirty) days. Needs Accu-check guide monitor system. DX. E11.9  Patient taking differently: 1 each by In Vitro route every 30 (thirty) days. Needs Accu-check guide monitor system. DX. E11.9 2 times daily   Elby Beck, FNP Taking Active Self  blood glucose meter kit and supplies [id number] No Dispense based on patient and insurance preference. Use up to four times daily as directed. (FOR ICD-10 E10.9, E11.9). Shamleffer, Melanie Crazier, MD Taking Active Self  buPROPion [facility]) 75 MG tablet [id number] No Take 1 tablet (75 mg total) by mouth 2 (two) times daily. Nche, Charlene Brooke, NP Taking Active   Cholecalciferol (VITAMIN D3) 1.25 MG (50000 UT) CAPS [id number] No Take 1 capsule by mouth once a week. Tonia Ghent, MD Taking Active Self  Continuous Blood Gluc Sensor (DEXCOM G6 SENSOR) MISC [id number] No 1 Device by Does not apply route as directed. Shamleffer, Melanie Crazier, MD Taking Active   Continuous Blood Gluc Transmit (DEXCOM G6 TRANSMITTER) MISC [id number] No 1 Device by Does not apply route as directed. Shamleffer, Melanie Crazier, MD Taking Active   diclofenac Sodium (VOLTAREN) 1 % GEL [id number] No Apply 2 g topically 4 (four) times daily.  Patient taking differently: Apply 2 g topically 4 (four) times daily as needed (pain).   Nche, Charlene Brooke, NP Taking Active Self  DULoxetine (CYMBALTA) 60 MG capsule [id number] No Take 1 capsule (60 mg total) by mouth 2 (two) times daily. Flossie Buffy,  NP Taking Active   empagliflozin (JARDIANCE) 25 MG TABS tablet [id number]  Take 1 tablet (25 mg total) by  mouth daily. Shamleffer, Melanie Crazier, MD  Active   ferrous sulfate 325 (65 FE) MG tablet [id number] No Take 1 tablet (325 mg total) by mouth every other day.  Patient taking differently: Take 325 mg by mouth daily.   Elby Beck, FNP Taking Active Self  fluticasone (FLONASE) 50 MCG/ACT nasal spray [id number] No Place 2 sprays into both nostrils daily as needed for allergies. Elby Beck, FNP Taking Active Self  gabapentin (NEURONTIN) 300 MG capsule [id number] No Take 1 capsule (300 mg total) by mouth 3 (three) times daily.  Patient taking differently: Take 300 mg by mouth 2 (two) times daily.   Nche, Charlene Brooke, NP Taking Active   insulin aspart (NOVOLOG FLEXPEN) 100 UNIT/ML FlexPen [id number] No Max daily 30 units  Patient taking differently: Inject 1-4 Units into the skin 3 (three) times daily as needed (blood sugar over 150). Max daily 30 units   Shamleffer, Melanie Crazier, MD Taking Active Self  Insulin Glargine (BASAGLAR KWIKPEN) 100 UNIT/ML [id number]  Inject 18 Units into the skin daily. Shamleffer, Melanie Crazier, MD  Active   Insulin Pen Needle 31G X 5 MM MISC [id number]  1 Units by Does not apply route in the morning, at noon, in the evening, and at bedtime. Shamleffer, Melanie Crazier, MD  Active   metFORMIN (GLUCOPHAGE-XR) 500 MG 24 hr tablet [id number]  Take 1 tablet (500 mg total) by mouth in the morning and at bedtime. Shamleffer, Melanie Crazier, MD  Active   [facility] VERIO test strip [id number] No CHECK UP TO 4 TIMES A DAY AS DIRECTED Shamleffer, Melanie Crazier, MD Taking Active Self  rosuvastatin (CRESTOR) 20 MG tablet [id number] No TAKE 1 TABLET(20 MG) BY MOUTH DAILY Nche, Charlene Brooke, NP Taking Active   Semaglutide,0.25 or 0.5MG/DOS, (OZEMPIC, 0.25 OR 0.5 MG/DOSE,) 2 MG/3ML SOPN [id number]  Inject 0.5 mg into the skin once a week. 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   History of lumbar fusion 02/28/2021   Osteoarthritis of glenohumeral joint (Right) 02/28/2021   Osteoarthritis of AC (acromioclavicular) joint (Right) 02/28/2021   Lumbosacral facet arthropathy (Multilevel) 02/28/2021   History of lumbar laminectomy (L4-S1) 02/28/2021   Osteoarthritis of knee (Left) 02/28/2021   Severe obesity (BMI 35.0-39.9) with comorbidity ([place]) 02/28/2021   Marijuana use 02/28/2021   Chronic lower extremity pain (2ry area of Pain) (Bilateral) (L>R) 02/28/2021   Chronic hip pain (3ry area of Pain) (Bilateral) (L>R) 02/28/2021   Chronic groin pain (Left) 02/28/2021   Impaired range of motion of hip 02/28/2021   PAD (peripheral artery disease) ([place]) 02/12/2021   Peripheral arterial disease ([place]) 02/06/2021   Decreased dorsalis pedis pulse 01/19/2021   Diabetes mellitus ([place]) 01/19/2021   Bipolar disorder ([place]) 01/17/2021   Pseudoarthrosis of lumbar spine 12/01/2020   Chronic low back pain (1ry area of Pain) (Bilateral) (L>R) w/o sciatica 09/06/2020   Pain in thoracic spine 08/03/2020   Ulnar 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 of left upper extremity 08/09/2019  Pincer nail deformity 05/19/2019   Excessive daytime sleepiness 03/15/2019   Type 2 diabetes mellitus with hyperglycemia, with long-term current use of insulin ([place]) 03/08/2019   Type 2 diabetes mellitus with diabetic polyneuropathy, with long-term current use of  insulin ([place]) 03/08/2019   Diabetic neuropathy with neurologic complication ([place]) 56/81/2751   Claudication of both lower extremities ([place]) 02/04/2019   RLS (restless legs syndrome) 02/04/2019   Sleep deprivation 02/04/2019   Hot flashes 02/04/2019   Neck pain 02/04/2019   Chronic migraine without aura, with intractable migraine, so stated, with status migrainosus 01/27/2019   Chronic knee pain (4th area of Pain) (Bilateral) (L>R) 06/18/2018   Radiculopathy 02/11/2018   Diabetic ketoacidosis ([place]) 03/24/2016   Dermatitis 03/24/2016   HTN (hypertension) 03/24/2016   HLD (hyperlipidemia) 03/24/2016   DM (diabetes mellitus) ([place]) 03/24/2016   Chronic daily headache 11/23/2012   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    Immunization History  Administered Date(s) Administered   Moderna Sars-Covid-2 Vaccination 07/20/2019, 08/20/2019   Pneumococcal Polysaccharide-23 08/25/2017   Zoster Recombinat (Shingrix) 01/19/2021, 03/19/2021    Conditions to be addressed/monitored:  Hypertension, Hyperlipidemia, Diabetes, and Bipolar Disorder, Chronic Pain  Care Plan : General Pharmacy (Adult)  Updates made by Germaine Pomfret, RPH since 10/18/2021 12:00 AM     Problem: Hypertension, Hyperlipidemia, Diabetes, and Bipolar Disorder, Chronic Pain   Priority: High     Long-Range Goal: Patient-Specific Goal   Start Date: 05/03/2021  Expected End Date: 05/04/2022  This Visit's Progress: On track  Recent Progress: On track  Priority: High  Note:   Current Barriers:  Unable to achieve control of diabetes   Pharmacist Clinical Goal(s):  Patient will achieve control of diabetes as evidenced by A1c less than 7% through collaboration with PharmD and provider.   Interventions: 1:1 collaboration with Nche, Charlene Brooke, NP regarding development and update of comprehensive plan of care as evidenced by provider attestation and co-signature Inter-disciplinary care team  collaboration (see longitudinal plan of care) Comprehensive medication review performed; medication list updated in electronic medical record  Hyperlipidemia: (LDL goal < 70) -Uncontrolled -left common femoral endarterectomy Jan 2023 -Current treatment: Rosuvastatin 20 mg daily: Appropriate, Query effective -Current treatment: Aspirin 81 mg daily: Appropriate, Effective, Safe, Accessible  -Medications previously tried: NA  -Could consider increasing rosuvastatin to 40 mg daily if LDL remains uncontrolled.   Diabetes (A1c goal <7%) -Controlled -Managed by Dr. Kelton Pillar -Current medications: Novolog 1-4 units three times daily with meals: Appropriate, Query effective  Basaglar 18 units nightly: Appropriate, Query effective  Jardiance 25 mg daily: Appropriate, Query effective  Metformin XR 500 mg twice daily: Appropriate, Query effective  Ozempic 0.5 mg weekly  -Medications previously tried: Glipizide, Jardiance   -Current home glucose readings: Utilizing Dexcom: 110s  Exrcise patterns: Stairs, swimming  -Hypoglycemia improved overall. Reports hypoglycemic symptoms: 1x weekly. She has her dexcom alarm set for <60, discussed changing settings back to alert if less than 70.  -Continue current medications   Bipolar Disorder (Goal: Maintain symptom remission) -Not ideally controlled -Current treatment: Bupropinon 75 mg twice daily Duloxetine 60 mg nightly -Medications previously tried/failed: Alprazolam, amitriptyline, fluoxetine, quetiapine, zolpidem,  -PHQ9: 16 -GAD7: 12 -Declines psychiatry/therapy referral  -Recommended to continue current medication  Chronic Pain (Goal: Minimize symptoms) -Uncontrolled -Current treatment  Duloxetine 60 mg twice daily: Appropriate, Query effective Gabapentin 300 mg three times daily: Appropriate, Query effective Voltaren 1% gel  -Medications previously tried: NA   -Patient reports continued frustration with her pain control. Finds  gabapentin ineffective. Sometimes skips medication due to concerns with pain tolerance. Also uses OTC ibuprofen and acetaminophen.  -Could consider trial of pregabalin in place of gabapentin.   Patient Goals/Self-Care Activities Patient will:  - check glucose 3-4 times daily, document, and provide at future appointments  Follow Up Plan: Telephone follow up appointment with care management team member scheduled for:  04/18/2022 at 3:00 PM        Medication Assistance: None required.  Patient affirms current coverage meets needs.  Compliance/Adherence/Medication fill history: Care Gaps: Tetanus Vaccine Shingrix Vaccine COVID-19 Vaccine Foot Exam  HTN: 134/95- 04/02/2021  Star-Rating Drugs: Rosuvastatin 20 mg last filled 03/15/2021 for 90 day supply at [facility]. Jardiance 25 mg last filled 04/07/2021 for 100 day supply at [facility]. Metformin 500 mg last filled 01/03/2021 for 900 day supply at [facility].  Patient's preferred pharmacy is:  [facility] [phone] - [place], [place] - [place] [address] Phone: [phone] Fax: [phone]  [facility] - [place], [place] [address] [place] [address] Phone: [phone] Fax: [phone]  Uses pill box? Yes Pt endorses 100% compliance  We discussed: Current pharmacy is preferred with insurance plan and patient is satisfied with pharmacy services Patient decided to: Continue current medication management strategy  Care Plan and Follow Up Patient Decision:  Patient agrees to Care Plan and Follow-up.  Plan: Telephone follow up appointment with care management team member scheduled for:  04/18/2022 at 3:00 PM  Junius Argyle, PharmD, [place], CPP Clinical Pharmacist Practitioner  [facility]  [phone]

## 2021-10-18 NOTE — Telephone Encounter (Signed)
Pt called and would like you to give her a call back

## 2021-10-19 DIAGNOSIS — E1165 Type 2 diabetes mellitus with hyperglycemia: Secondary | ICD-10-CM | POA: Diagnosis not present

## 2021-10-19 DIAGNOSIS — E1142 Type 2 diabetes mellitus with diabetic polyneuropathy: Secondary | ICD-10-CM | POA: Diagnosis not present

## 2021-10-19 NOTE — Telephone Encounter (Signed)
Pt advised.

## 2021-10-22 NOTE — Therapy (Signed)
OUTPATIENT PHYSICAL THERAPY TREATMENT NOTE   Patient Name: Autumn Valenzuela MRN: [id number] DOB:04-13-1960, 61 y.o., female Today's Date: 10/22/2021  PCP: Anne Ng, NP   REFERRING PROVIDER: Marcene Corning, MD   END OF SESSION:      Past Medical History:  Diagnosis Date   Allergy    Anemia    Anxiety    Arthritis    Asthma    Back pain    Bipolar disorder (HCC)    CVA (cerebral infarction)    Depression    Diabetes mellitus without complication (HCC)    High cholesterol    Hypertension    Left leg pain    Migraine    Sleep apnea    does not use a cpap   Stroke (HCC) 2007   no lasting weakness   Past Surgical History:  Procedure Laterality Date   ABDOMINAL AORTOGRAM W/LOWER EXTREMITY N/A 02/08/2021   Procedure: ABDOMINAL AORTOGRAM W/LOWER EXTREMITY;  Surgeon: Cephus Shelling, MD;  Location: [facility];  Service: Cardiovascular;  Laterality: N/A;   ABDOMINAL HYSTERECTOMY     ANTERIOR LATERAL LUMBAR FUSION WITH PERCUTANEOUS SCREW 1 LEVEL Left 02/11/2018   Procedure: LEFT LATERAL LUMBAR  FOUR-FIVE INTERBODY FUSION WITH INSTRUMENTATION AND ALLOGRAFT;  Surgeon: Estill Bamberg, MD;  Location: [facility];  Service: Orthopedics;  Laterality: Left;  LEFT LATERAL LUMBAR  FOUR-FIVE INTERBODY FUSION WITH INSTRUMENTATION AND ALLOGRAFT   BACK SURGERY     Lumbar fusion L5-S1   DIABETES     ENDARTERECTOMY FEMORAL Left 02/12/2021   Procedure: LEFT COMMON FEMORAL ENDARTERECTOMY WITH BOVINE PATCH;  Surgeon: Cephus Shelling, MD;  Location: [facility];  Service: Vascular;  Laterality: Left;   EYE SURGERY Bilateral    INGUINAL LYMPH NODE BIOPSY Left 07/25/2021   Procedure: EXCISIONAL BIOPSY LEFT INGUINAL LYMPH NODES;  Surgeon: Abigail Miyamoto, MD;  Location: [facility];  Service: General;  Laterality: Left;   KNEE SURGERY     l 4-l5 status post lateral posterior fusion January 30,2020     NEUROFORAMINAL STENOSIS Left    Severe L4-L5, involving the level  above previous fusion.   PARTIAL HYSTERECTOMY     RADICULOPATHY Left    L4, Secondary to an L4-L5 Spondylolisthesis   SHOULDER SURGERY     Patient Active Problem List   Diagnosis Date Noted   Spondylosis without myelopathy or radiculopathy, lumbosacral region 06/26/2021   DDD (degenerative disc disease), lumbosacral 06/26/2021   Lumbar facet syndrome 05/14/2021   History of diabetic ketoacidosis 04/16/2021   Abnormal CT scan, lumbar spine (10/11/2020) 04/16/2021   Chronic pain syndrome 02/28/2021   Pharmacologic therapy 02/28/2021   Disorder of skeletal system 02/28/2021   Problems influencing health status 02/28/2021   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   History of lumbar fusion 02/28/2021   Osteoarthritis of glenohumeral joint (Right) 02/28/2021   Osteoarthritis of AC (acromioclavicular) joint (Right) 02/28/2021   Lumbosacral facet arthropathy (Multilevel) 02/28/2021   History of lumbar laminectomy (L4-S1) 02/28/2021   Osteoarthritis of knee (Left) 02/28/2021   Severe obesity (BMI 35.0-39.9) with comorbidity (HCC) 02/28/2021   Marijuana use 02/28/2021   Chronic lower extremity pain (2ry area of Pain) (Bilateral) (L>R) 02/28/2021   Chronic hip pain (3ry area of Pain) (Bilateral) (L>R) 02/28/2021   Chronic groin pain (Left) 02/28/2021   Impaired range of motion of hip 02/28/2021   PAD (peripheral artery disease) (HCC) 02/12/2021   Peripheral arterial disease (HCC) 02/06/2021   Decreased dorsalis pedis pulse 01/19/2021  Diabetes mellitus ([place]) 01/19/2021   Bipolar disorder ([place]) 01/17/2021   Pseudoarthrosis of lumbar spine 12/01/2020   Chronic low back pain (1ry area of Pain) (Bilateral) (L>R) w/o sciatica 09/06/2020   Pain in thoracic spine 08/03/2020   Ulnar 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 of left upper extremity 08/09/2019   Pincer nail deformity 05/19/2019   Excessive daytime sleepiness 03/15/2019   Type 2 diabetes mellitus with hyperglycemia, with long-term current use of  insulin ([place]) 03/08/2019   Type 2 diabetes mellitus with diabetic polyneuropathy, with long-term current use of insulin ([place]) 03/08/2019   Diabetic neuropathy with neurologic complication ([place]) 75/64/3329   Claudication of both lower extremities ([place]) 02/04/2019   RLS (restless legs syndrome) 02/04/2019   Sleep deprivation 02/04/2019   Hot flashes 02/04/2019   Neck pain 02/04/2019   Chronic migraine without aura, with intractable migraine, so stated, with status migrainosus 01/27/2019   Chronic knee pain (4th area of Pain) (Bilateral) (L>R) 06/18/2018   Radiculopathy 02/11/2018   Diabetic ketoacidosis ([place]) 03/24/2016   Dermatitis 03/24/2016   HTN (hypertension) 03/24/2016   HLD (hyperlipidemia) 03/24/2016   DM (diabetes mellitus) ([place]) 03/24/2016   Chronic daily headache 11/23/2012   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    REFERRING DIAG: Low back pain and rib contusion   THERAPY DIAG:  No diagnosis found.  Rationale for Evaluation and Treatment Rehabilitation  PERTINENT HISTORY: Previous lumbar fusions   PRECAUTIONS: None   SUBJECTIVE: Patient reports the day after she does her exercises she is very sore.   PAIN:  Are you having pain? Yes:  NPRS scale: 7/10 Pain location: Left lower back, left buttocks and left leg Pain description: Stabbing, discomfort, aching, needles Aggravating factors: Sitting, bending, lifting, twisting Relieving factors: Ice/heat, medication, espson salt baths   OBJECTIVE: (objective measures completed at initial evaluation unless otherwise dated) PATIENT SURVEYS:  FOTO 33% functional status   SENSATION: WFL, patient reports tingling left leg but no sensation deficits noted   MUSCLE LENGTH: Left hip flexion limitation, piriformis limitation, hamstring grossly WFL   POSTURE:  Rounded shoulders   PALPATION: Tenderness generalized lumbar spine and left gluteal region   LUMBAR ROM:    Active  A/PROM  eval   Flexion Fingertips to toes, minimal lumbar flexion  Extension 50%  Right lateral flexion 75%  Left lateral flexion 75%  Right rotation 75%  Left rotation 75%    LOWER EXTREMITY ROM:                          Hip PROM grossly WFL, patient does report anterior hip pain at end range flexion   LOWER EXTREMITY MMT:     MMT Right eval Left eval Rt / Lt 10/16/2021  Hip flexion 4- 4- 4- / 4-  Hip extension 4- 4- 4- / 4-  Hip abduction 4- 4- 4- / 4-  Knee flexion 4 4-   Knee extension 4 4-     LUMBAR SPECIAL TESTS:  SLR positive on left   FUNCTIONAL TESTS:  Not assessed   GAIT: Assistive device utilized: None Level of assistance: Complete Independence    TODAY's TREATMENT:  [facility] Adult PT Treatment:                                                DATE: 10/23/21 Therapeutic Exercise: Nustep L6 x  5 min with UE/LE while taking subjective Seated lumbar flexion physioball rollout 10 x 5 sec Piriformis stretch 3 x 20 sec each SLR 2 x 10 each Bridge 2 x 6 with 5 sec hold Hooklying unilateral clamshell with red 2 x 5 each PPT with alternating march 2 x 10 each Sidelying hip abduction 2 x 10 each Manual:  Left LAD with gentle oscillations x 3 bouts   [facility] Adult PT Treatment:                                                DATE: 10/16/21 Therapeutic Exercise: Nustep L6 x 5 min with UE/LE while taking subjective Seated lumbar flexion physioball rollout 10 x 5 sec Piriformis stretch 3 x 20 sec each SLR 2 x 10 each Bridge 2 x 6 with 5 sec hold Hooklying unilateral clamshell with red 2 x 5 each PPT with alternating march 2 x 10 each Sidelying hip abduction 2 x 10 each Manual:  Left LAD with gentle oscillations x 3 bouts  [facility] Adult PT Treatment:                                                DATE: 10/09/21 Therapeutic Exercise: Nustep L 5 UE/LE x 5 minutes  Seated EOM hamstring stretch 3 x 20 sec  Bridge 5 sec x 10  Figure 4 push and pull Supine hamstring stretch  Side clam  red x 15 each LTR 90/90 ab brace 5 sec x 15 Hip flexor stretch EOM with knee flexion Manual:  LAD LLE x 3   PATIENT EDUCATION:  Education details: HEP Person educated: Patient Education method: Consulting civil engineer, Media planner, Corporate treasurer cues, Verbal cues Education comprehension: verbalized understanding, returned demonstration, verbal cues required, tactile cues required, and needs further education   HOME EXERCISE PROGRAM: Access Code: [email or web address]     ASSESSMENT: CLINICAL IMPRESSION: Patient will fair tolerance for therapy this visit, no adverse effects reported. *** Patient would benefit from continued skilled PT to progress her mobility and strength in order to reduce pain and maximize functional ability.  Therapy focused on continued lumbar mobility and progression of core and hip strengthening. She reports significant soreness following exercises so continued with mat based exercises and provided time for rest breaks as needed. She does continue to exhibit gross strength deficits with greater difficulty on the left side. She continues to have relief with LAD so performed at end of session to reduce pain. Updated HEP with good tolerance.      OBJECTIVE IMPAIRMENTS decreased activity tolerance, decreased ROM, decreased strength, impaired flexibility, postural dysfunction, and pain.    ACTIVITY LIMITATIONS carrying, lifting, bending, sitting, standing, squatting, hygiene/grooming, and locomotion level   PARTICIPATION LIMITATIONS: meal prep, cleaning, laundry, shopping, community activity, and yard work   [place], Past/current experiences, and Time since onset of injury/illness/exacerbation are also affecting patient's functional outcome.      GOALS: Goals reviewed with patient? Yes   SHORT TERM GOALS: Target date: 11/01/2021   Patient will be I with initial HEP in order to progress with therapy. Baseline: HEP provided at eval Goal status: ONGOING   2.  PT will  review FOTO with patient by 3rd visit in order to understand expected  progress and outcome with therapy. Baseline: FOTO assessed at eval Goal status: MET   LONG TERM GOALS: Target date: 11/29/2021   Patient will be I with final HEP to maintain progress from PT. Baseline: HEP provided at eval Goal status: INITIAL   2.  Patient will report >/= 44% status on FOTO to indicate improved functional ability. Baseline: 33% Goal status: INITIAL   3.  Patient will demonstrate >/= 4/5 MMT hip strength to improve standing and walking tolerance. Baseline: grossly 4-/5 MMT Goal status: INITIAL   4.  Patient will report pain level with activity </= 2/10 in order to reduce functional limitations Baseline: pain level with activity >/= 4/5 MMT Goal status: INITIAL     PLAN: PT FREQUENCY: 1x/week   PT DURATION: 8 weeks   PLANNED INTERVENTIONS: Therapeutic exercises, Therapeutic activity, Neuromuscular re-education, Balance training, Gait training, Patient/Family education, Self Care, Joint mobilization, Joint manipulation, Aquatic Therapy, Dry Needling, Electrical stimulation, Cryotherapy, Moist heat, Manual therapy, and Re-evaluation.   PLAN FOR NEXT SESSION: Review HEP and progress PRN, LAD for left leg, progress core and hip strengthening, FOTO 6th visit        Hilda Blades, PT, DPT, LAT, ATC 10/22/21  11:17 AM Phone: [phone] Fax: [phone]

## 2021-10-23 ENCOUNTER — Telehealth: Payer: Self-pay | Admitting: Nurse Practitioner

## 2021-10-23 ENCOUNTER — Ambulatory Visit: Payer: Medicare HMO | Admitting: Physical Therapy

## 2021-10-23 ENCOUNTER — Telehealth: Payer: Self-pay

## 2021-10-23 ENCOUNTER — Encounter: Payer: Self-pay | Admitting: Physical Therapy

## 2021-10-23 ENCOUNTER — Other Ambulatory Visit: Payer: Self-pay

## 2021-10-23 DIAGNOSIS — M6281 Muscle weakness (generalized): Secondary | ICD-10-CM

## 2021-10-23 DIAGNOSIS — M5459 Other low back pain: Secondary | ICD-10-CM | POA: Diagnosis not present

## 2021-10-23 DIAGNOSIS — M79605 Pain in left leg: Secondary | ICD-10-CM

## 2021-10-23 NOTE — Telephone Encounter (Signed)
Pt called and said she didn't get her medication for her nerve pain yet or was you going to prescribe something different

## 2021-10-23 NOTE — Telephone Encounter (Signed)
Spoke with pharmacy about pt lyrica PA was not approved please advise

## 2021-10-23 NOTE — Patient Instructions (Signed)
Access Code: [email or web address] URL: [email or web address] Date: 10/23/2021 Prepared by: Hilda Blades  Exercises - Supine Piriformis Stretch with Foot on Ground  - 1 x daily - 3 sets - 30 seconds hold - Supine Lower Trunk Rotation  - 1 x daily - 10 reps - 5 seconds hold - Bridge  - 1 x daily - 2 sets - 10 reps - 3 seconds hold - Supine 90/90 Abdominal Bracing  - 1 x daily - 2 sets - 10 reps - 5 seconds hold - Clam with Resistance  - 1 x daily - 2 sets - 10 reps - Sidelying Hip Abduction  - 1 x daily - 2 sets - 10 reps - Active Straight Leg Raise with Quad Set  - 1 x daily - 2 sets - 10 reps

## 2021-10-24 DIAGNOSIS — R29898 Other symptoms and signs involving the musculoskeletal system: Secondary | ICD-10-CM | POA: Diagnosis not present

## 2021-10-24 DIAGNOSIS — G5602 Carpal tunnel syndrome, left upper limb: Secondary | ICD-10-CM | POA: Diagnosis not present

## 2021-10-24 DIAGNOSIS — M25632 Stiffness of left wrist, not elsewhere classified: Secondary | ICD-10-CM | POA: Diagnosis not present

## 2021-10-24 DIAGNOSIS — G5622 Lesion of ulnar nerve, left upper limb: Secondary | ICD-10-CM | POA: Diagnosis not present

## 2021-10-24 DIAGNOSIS — M778 Other enthesopathies, not elsewhere classified: Secondary | ICD-10-CM | POA: Diagnosis not present

## 2021-10-24 DIAGNOSIS — T7840XS Allergy, unspecified, sequela: Secondary | ICD-10-CM | POA: Diagnosis not present

## 2021-10-25 ENCOUNTER — Telehealth: Payer: Self-pay

## 2021-10-25 DIAGNOSIS — M5440 Lumbago with sciatica, unspecified side: Secondary | ICD-10-CM | POA: Diagnosis not present

## 2021-10-25 NOTE — Telephone Encounter (Signed)
Spoke with pt per provider order pt verbalized understanding

## 2021-10-30 ENCOUNTER — Telehealth: Payer: Self-pay

## 2021-10-30 ENCOUNTER — Encounter: Payer: Self-pay | Admitting: Physical Therapy

## 2021-10-30 ENCOUNTER — Ambulatory Visit: Payer: Medicare HMO | Admitting: Physical Therapy

## 2021-10-30 DIAGNOSIS — M79605 Pain in left leg: Secondary | ICD-10-CM | POA: Diagnosis not present

## 2021-10-30 DIAGNOSIS — M5459 Other low back pain: Secondary | ICD-10-CM | POA: Diagnosis not present

## 2021-10-30 DIAGNOSIS — M6281 Muscle weakness (generalized): Secondary | ICD-10-CM

## 2021-10-30 NOTE — Telephone Encounter (Signed)
Called patient for [place], Appointment and patient states se has a doctors appointment and currently cannot complete the call , states she will call back to reschedule.  L.Wilson, LPN

## 2021-10-30 NOTE — Therapy (Signed)
OUTPATIENT PHYSICAL THERAPY TREATMENT NOTE   Patient Name: Autumn Valenzuela MRN: [id number] DOB:[birth year], 61 y.o., female Today's Date: 10/30/2021  PCP: Flossie Buffy, NP   REFERRING PROVIDER: Melrose Nakayama, MD   END OF SESSION:   PT End of Session - 10/30/21 0845     Visit Number 5    Number of Visits 9    Date for PT Re-Evaluation 11/29/21    Authorization Type AET[employer] MEDICARE    Progress Note Due on Visit 10    PT Start Time 0845    PT Stop Time 0930    PT Time Calculation (min) 45 min               Past Medical History:  Diagnosis Date   Allergy    Anemia    Anxiety    Arthritis    Asthma    Back pain    Bipolar disorder ([place])    CVA (cerebral infarction)    Depression    Diabetes mellitus without complication (HCC)    High cholesterol    Hypertension    Left leg pain    Migraine    Sleep apnea    does not use a cpap   Stroke ([place]) 2007   no lasting weakness   Past Surgical History:  Procedure Laterality Date   ABDOMINAL AORTOGRAM W/LOWER EXTREMITY N/A 02/08/2021   Procedure: ABDOMINAL AORTOGRAM W/LOWER EXTREMITY;  Surgeon: Marty Heck, MD;  Location: [facility];  Service: Cardiovascular;  Laterality: N/A;   ABDOMINAL HYSTERECTOMY     ANTERIOR LATERAL LUMBAR FUSION WITH PERCUTANEOUS SCREW 1 LEVEL Left 02/11/2018   Procedure: LEFT LATERAL LUMBAR  FOUR-FIVE INTERBODY FUSION WITH INSTRUMENTATION AND ALLOGRAFT;  Surgeon: Phylliss Bob, MD;  Location: [facility];  Service: Orthopedics;  Laterality: Left;  LEFT LATERAL LUMBAR  FOUR-FIVE INTERBODY FUSION WITH INSTRUMENTATION AND ALLOGRAFT   BACK SURGERY     Lumbar fusion L5-S1   DIABETES     ENDARTERECTOMY FEMORAL Left 02/12/2021   Procedure: LEFT COMMON FEMORAL ENDARTERECTOMY WITH BOVINE PATCH;  Surgeon: Marty Heck, MD;  Location: [facility];  Service: Vascular;  Laterality: Left;   EYE SURGERY Bilateral    INGUINAL LYMPH NODE BIOPSY Left 07/25/2021   Procedure: EXCISIONAL BIOPSY  LEFT INGUINAL LYMPH NODES;  Surgeon: Coralie Keens, MD;  Location: [facility];  Service: General;  Laterality: Left;   KNEE SURGERY     l 4-l5 status post lateral posterior fusion January 30,2020     NEUROFORAMINAL STENOSIS Left    Severe L4-L5, involving the level above previous fusion.   PARTIAL HYSTERECTOMY     RADICULOPATHY Left    L4, Secondary to an L4-L5 Spondylolisthesis   SHOULDER SURGERY     Patient Active Problem List   Diagnosis Date Noted   Spondylosis without myelopathy or radiculopathy, lumbosacral region 06/26/2021   DDD (degenerative disc disease), lumbosacral 06/26/2021   Lumbar facet syndrome 05/14/2021   History of diabetic ketoacidosis 04/16/2021   Abnormal CT scan, lumbar spine (10/11/2020) 04/16/2021   Chronic pain syndrome 02/28/2021   Pharmacologic therapy 02/28/2021   Disorder of skeletal system 02/28/2021   Problems influencing health status 02/28/2021   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   History of lumbar fusion 02/28/2021   Osteoarthritis of glenohumeral joint (Right) 02/28/2021   Osteoarthritis of AC (acromioclavicular) joint (Right) 02/28/2021   Lumbosacral facet arthropathy (Multilevel) 02/28/2021   History of lumbar laminectomy (L4-S1) 02/28/2021   Osteoarthritis of knee (Left) 02/28/2021  Severe obesity (BMI 35.0-39.9) with comorbidity ([place]) 02/28/2021   Marijuana use 02/28/2021   Chronic lower extremity pain (2ry area of Pain) (Bilateral) (L>R) 02/28/2021   Chronic hip pain (3ry area of Pain) (Bilateral) (L>R) 02/28/2021   Chronic groin pain (Left) 02/28/2021   Impaired range of motion of hip 02/28/2021   PAD (peripheral artery disease) ([place]) 02/12/2021   Peripheral arterial disease ([place]) 02/06/2021   Decreased dorsalis pedis pulse 01/19/2021   Diabetes mellitus ([place]) 01/19/2021   Bipolar disorder ([place]) 01/17/2021   Pseudoarthrosis of lumbar spine 12/01/2020   Chronic low back pain (1ry area of Pain) (Bilateral)  (L>R) w/o sciatica 09/06/2020   Pain in thoracic spine 08/03/2020   Ulnar 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 of left upper extremity 08/09/2019   Pincer nail deformity 05/19/2019   Excessive daytime sleepiness 03/15/2019   Type 2 diabetes mellitus with hyperglycemia, with long-term current use of insulin ([place]) 03/08/2019   Type 2 diabetes mellitus with diabetic polyneuropathy, with long-term current use of insulin ([place]) 03/08/2019   Diabetic neuropathy with neurologic complication ([place]) 64/38/3818   Claudication of both lower extremities ([place]) 02/04/2019   RLS (restless legs syndrome) 02/04/2019   Sleep deprivation 02/04/2019   Hot flashes 02/04/2019   Neck pain 02/04/2019   Chronic migraine without aura, with intractable migraine, so stated, with status migrainosus 01/27/2019   Chronic knee pain (4th area of Pain) (Bilateral) (L>R) 06/18/2018   Radiculopathy 02/11/2018   Diabetic ketoacidosis ([place]) 03/24/2016   Dermatitis 03/24/2016   HTN (hypertension) 03/24/2016   HLD (hyperlipidemia) 03/24/2016   DM (diabetes mellitus) ([place]) 03/24/2016   Chronic daily headache 11/23/2012   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    REFERRING DIAG: Low back pain and rib contusion   THERAPY DIAG:  Other low back pain  Pain in left leg  Muscle weakness (generalized)  Rationale for Evaluation and Treatment Rehabilitation  PERTINENT HISTORY: Previous lumbar fusions   PRECAUTIONS: None   SUBJECTIVE: Patient reports she feels like she is getting worse. She notes pain is traveling into LLE to knee and that she is having more difficulty completing ADLs especially after PT appointments which increase her pain. I saw MD who wants me to continue PT for a few more weeks.   PAIN:  Are you having pain? Yes:  NPRS scale: 9/10 Pain location: Left lower back, left buttocks and left leg Pain description: Stabbing, discomfort, aching, needles Aggravating factors: Sitting, bending, lifting,  twisting Relieving factors: Ice/heat, medication, espson salt baths   OBJECTIVE: (objective measures completed at initial evaluation unless otherwise dated) PATIENT SURVEYS:  FOTO 33% functional status FOTO  26% functional status   SENSATION: WFL, patient reports tingling left leg but no sensation deficits noted   MUSCLE LENGTH: Left hip flexion limitation, piriformis limitation, hamstring grossly WFL   POSTURE:  Rounded shoulders   PALPATION: Tenderness generalized lumbar spine and left gluteal region   LUMBAR ROM:    Active  A/PROM  eval  Flexion Fingertips to toes, minimal lumbar flexion  Extension 50%  Right lateral flexion 75%  Left lateral flexion 75%  Right rotation 75%  Left rotation 75%    LOWER EXTREMITY ROM:                          Hip PROM grossly WFL, patient does report anterior hip pain at end range flexion   LOWER EXTREMITY MMT:     MMT Right eval Left eval Rt / Lt 10/16/2021 Rt /  Lt 10/23/2021  Hip flexion 4- 4- 4- / 4- 4 / 4-  Hip extension 4- 4- 4- / 4-   Hip abduction 4- 4- 4- / 4- 4- / 4-  Knee flexion 4 4-    Knee extension 4 4-      LUMBAR SPECIAL TESTS:  SLR positive on left   FUNCTIONAL TESTS:  Not assessed   GAIT: Assistive device utilized: None Level of assistance: Complete Independence    TODAY's TREATMENT:  [facility] Adult PT Treatment:                                                DATE: 10/30/21 Therapeutic Exercise: Nustep L5 x 5 minutes  Standing hip abduction 10 x 2 each  Sink Squats x 5 Seated hamstring stretch Seated figure 4 - push and  pull  Seated hamstring stretch - foot in chair  STS x 10 from mat  Physioball roll outs forward and lateral  PPT x 10  PPT to bridge x 10   [facility] Adult PT Treatment:                                                DATE: 10/23/21 Therapeutic Exercise: Nustep L6 x 5 min with UE/LE while taking subjective LTR x 10 Piriformis stretch 3 x 20 sec each SLR 2 x 10 each Bridge 2 x 8  with 5 sec hold Hooklying unilateral clamshell with red 2 x 10 each PPT with alternating march with red 2 x 10 each Sidelying hip abduction 2 x 10 each Seated hamstring curl with red 2 x 10 each LAQ with 4# 2 x 10 each Seated lumbar flexion physioball rollout 10 x 5 sec   [facility] Adult PT Treatment:                                                DATE: 10/16/21 Therapeutic Exercise: Nustep L6 x 5 min with UE/LE while taking subjective Seated lumbar flexion physioball rollout 10 x 5 sec Piriformis stretch 3 x 20 sec each SLR 2 x 10 each Bridge 2 x 6 with 5 sec hold Hooklying unilateral clamshell with red 2 x 5 each PPT with alternating march 2 x 10 each Sidelying hip abduction 2 x 10 each Manual:  Left LAD with gentle oscillations x 3 bouts  [facility] Adult PT Treatment:                                                DATE: 10/09/21 Therapeutic Exercise: Nustep L 5 UE/LE x 5 minutes  Seated EOM hamstring stretch 3 x 20 sec  Bridge 5 sec x 10  Figure 4 push and pull Supine hamstring stretch  Side clam red x 15 each LTR 90/90 ab brace 5 sec x 15 Hip flexor stretch EOM with knee flexion Manual:  LAD LLE x 3   PATIENT EDUCATION:  Education details: HEP update Person educated: Patient Education method: Explanation,  Demonstration, Tactile cues, Verbal cues, Handout Education comprehension: verbalized understanding, returned demonstration, verbal cues required, tactile cues required, and needs further education   HOME EXERCISE PROGRAM: Access Code: [email or web address]     ASSESSMENT: CLINICAL IMPRESSION: Patient reports that she is getting worse. Her FOTO score reflects a decrease in function with decreased tolerance to housework and driving of note. She saw MD recently who recommended that she continue PT for a few more weeks. Therapy focused primarily on strengthening for core and hips with options for standing and seated options for stretching and hip strengthening. No additions to HEP as she  continues to have increased pain following sessions. Will see how she does after today's session.   Patient would benefit from continued skilled PT to progress her mobility and strength in order to reduce pain and maximize functional ability.     OBJECTIVE IMPAIRMENTS decreased activity tolerance, decreased ROM, decreased strength, impaired flexibility, postural dysfunction, and pain.    ACTIVITY LIMITATIONS carrying, lifting, bending, sitting, standing, squatting, hygiene/grooming, and locomotion level   PARTICIPATION LIMITATIONS: meal prep, cleaning, laundry, shopping, community activity, and yard work   [place], Past/current experiences, and Time since onset of injury/illness/exacerbation are also affecting patient's functional outcome.      GOALS: Goals reviewed with patient? Yes   SHORT TERM GOALS: Target date: 11/01/2021   Patient will be I with initial HEP in order to progress with therapy. Baseline: HEP provided at eval Goal status: ONGOING   2.  PT will review FOTO with patient by 3rd visit in order to understand expected progress and outcome with therapy. Baseline: FOTO assessed at eval Goal status: MET   LONG TERM GOALS: Target date: 11/29/2021   Patient will be I with final HEP to maintain progress from PT. Baseline: HEP provided at eval Goal status: INITIAL   2.  Patient will report >/= 44% status on FOTO to indicate improved functional ability. Baseline: 33% Status: 26% Goal status: ONGOING   3.  Patient will demonstrate >/= 4/5 MMT hip strength to improve standing and walking tolerance. Baseline: grossly 4-/5 MMT Goal status: INITIAL   4.  Patient will report pain level with activity </= 2/10 in order to reduce functional limitations Baseline: pain level with activity >/= 4/5 MMT Goal status: INITIAL     PLAN: PT FREQUENCY: 1x/week   PT DURATION: 8 weeks   PLANNED INTERVENTIONS: Therapeutic exercises, Therapeutic activity,  Neuromuscular re-education, Balance training, Gait training, Patient/Family education, Self Care, Joint mobilization, Joint manipulation, Aquatic Therapy, Dry Needling, Electrical stimulation, Cryotherapy, Moist heat, Manual therapy, and Re-evaluation.   PLAN FOR NEXT SESSION: Review HEP and progress PRN, LAD for left leg, progress core and hip strengthening, FOTO 6th visit (completed on 5th)         Hessie Diener, PTA 10/30/21 11:49 AM Phone: [phone] Fax: [phone]

## 2021-10-31 DIAGNOSIS — G5602 Carpal tunnel syndrome, left upper limb: Secondary | ICD-10-CM | POA: Diagnosis not present

## 2021-10-31 DIAGNOSIS — R29898 Other symptoms and signs involving the musculoskeletal system: Secondary | ICD-10-CM | POA: Diagnosis not present

## 2021-10-31 DIAGNOSIS — G5622 Lesion of ulnar nerve, left upper limb: Secondary | ICD-10-CM | POA: Diagnosis not present

## 2021-10-31 DIAGNOSIS — M778 Other enthesopathies, not elsewhere classified: Secondary | ICD-10-CM | POA: Diagnosis not present

## 2021-10-31 DIAGNOSIS — M25632 Stiffness of left wrist, not elsewhere classified: Secondary | ICD-10-CM | POA: Diagnosis not present

## 2021-10-31 DIAGNOSIS — T7840XS Allergy, unspecified, sequela: Secondary | ICD-10-CM | POA: Diagnosis not present

## 2021-11-05 NOTE — Telephone Encounter (Signed)
I spoke to patient and she rescheduled appointment to 11/14/2021.

## 2021-11-07 ENCOUNTER — Encounter: Payer: Self-pay | Admitting: Physical Therapy

## 2021-11-07 ENCOUNTER — Other Ambulatory Visit: Payer: Self-pay

## 2021-11-07 ENCOUNTER — Ambulatory Visit: Payer: Medicare HMO | Admitting: Physical Therapy

## 2021-11-07 DIAGNOSIS — R29898 Other symptoms and signs involving the musculoskeletal system: Secondary | ICD-10-CM | POA: Diagnosis not present

## 2021-11-07 DIAGNOSIS — M6281 Muscle weakness (generalized): Secondary | ICD-10-CM | POA: Diagnosis not present

## 2021-11-07 DIAGNOSIS — M778 Other enthesopathies, not elsewhere classified: Secondary | ICD-10-CM | POA: Diagnosis not present

## 2021-11-07 DIAGNOSIS — G5602 Carpal tunnel syndrome, left upper limb: Secondary | ICD-10-CM | POA: Diagnosis not present

## 2021-11-07 DIAGNOSIS — M25632 Stiffness of left wrist, not elsewhere classified: Secondary | ICD-10-CM | POA: Diagnosis not present

## 2021-11-07 DIAGNOSIS — M79605 Pain in left leg: Secondary | ICD-10-CM

## 2021-11-07 DIAGNOSIS — M5459 Other low back pain: Secondary | ICD-10-CM

## 2021-11-07 DIAGNOSIS — G5622 Lesion of ulnar nerve, left upper limb: Secondary | ICD-10-CM | POA: Diagnosis not present

## 2021-11-07 DIAGNOSIS — T7840XS Allergy, unspecified, sequela: Secondary | ICD-10-CM | POA: Diagnosis not present

## 2021-11-07 NOTE — Therapy (Signed)
OUTPATIENT PHYSICAL THERAPY TREATMENT NOTE   Patient Name: Autumn Valenzuela MRN: [id number] DOB:1960/09/02, 61 y.o., female Today's Date: 11/07/2021  PCP: Flossie Buffy, NP   REFERRING PROVIDER: Melrose Nakayama, MD   END OF SESSION:   PT End of Session - 11/07/21 1717     Visit Number 6    Number of Visits 9    Date for PT Re-Evaluation 11/29/21    Authorization Type AET[employer] MEDICARE    Progress Note Due on Visit 10    PT Start Time 1700    PT Stop Time 1725    PT Time Calculation (min) 25 min    Activity Tolerance Patient limited by pain    Behavior During Therapy [facility] for tasks assessed/performed                Past Medical History:  Diagnosis Date   Allergy    Anemia    Anxiety    Arthritis    Asthma    Back pain    Bipolar disorder ([place])    CVA (cerebral infarction)    Depression    Diabetes mellitus without complication ([place])    High cholesterol    Hypertension    Left leg pain    Migraine    Sleep apnea    does not use a cpap   Stroke ([place]) 2007   no lasting weakness   Past Surgical History:  Procedure Laterality Date   ABDOMINAL AORTOGRAM W/LOWER EXTREMITY N/A 02/08/2021   Procedure: ABDOMINAL AORTOGRAM W/LOWER EXTREMITY;  Surgeon: Marty Heck, MD;  Location: [facility];  Service: Cardiovascular;  Laterality: N/A;   ABDOMINAL HYSTERECTOMY     ANTERIOR LATERAL LUMBAR FUSION WITH PERCUTANEOUS SCREW 1 LEVEL Left 02/11/2018   Procedure: LEFT LATERAL LUMBAR  FOUR-FIVE INTERBODY FUSION WITH INSTRUMENTATION AND ALLOGRAFT;  Surgeon: Phylliss Bob, MD;  Location: [facility];  Service: Orthopedics;  Laterality: Left;  LEFT LATERAL LUMBAR  FOUR-FIVE INTERBODY FUSION WITH INSTRUMENTATION AND ALLOGRAFT   BACK SURGERY     Lumbar fusion L5-S1   DIABETES     ENDARTERECTOMY FEMORAL Left 02/12/2021   Procedure: LEFT COMMON FEMORAL ENDARTERECTOMY WITH BOVINE PATCH;  Surgeon: Marty Heck, MD;  Location: [facility];  Service: Vascular;  Laterality:  Left;   EYE SURGERY Bilateral    INGUINAL LYMPH NODE BIOPSY Left 07/25/2021   Procedure: EXCISIONAL BIOPSY LEFT INGUINAL LYMPH NODES;  Surgeon: Coralie Keens, MD;  Location: [facility];  Service: General;  Laterality: Left;   KNEE SURGERY     l 4-l5 status post lateral posterior fusion January 30,2020     NEUROFORAMINAL STENOSIS Left    Severe L4-L5, involving the level above previous fusion.   PARTIAL HYSTERECTOMY     RADICULOPATHY Left    L4, Secondary to an L4-L5 Spondylolisthesis   SHOULDER SURGERY     Patient Active Problem List   Diagnosis Date Noted   Spondylosis without myelopathy or radiculopathy, lumbosacral region 06/26/2021   DDD (degenerative disc disease), lumbosacral 06/26/2021   Lumbar facet syndrome 05/14/2021   History of diabetic ketoacidosis 04/16/2021   Abnormal CT scan, lumbar spine (10/11/2020) 04/16/2021   Chronic pain syndrome 02/28/2021   Pharmacologic therapy 02/28/2021   Disorder of skeletal system 02/28/2021   Problems influencing health status 02/28/2021   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   History of lumbar fusion 02/28/2021   Osteoarthritis of glenohumeral joint (Right) 02/28/2021   Osteoarthritis of AC (acromioclavicular) joint (Right) 02/28/2021   Lumbosacral  facet arthropathy (Multilevel) 02/28/2021   History of lumbar laminectomy (L4-S1) 02/28/2021   Osteoarthritis of knee (Left) 02/28/2021   Severe obesity (BMI 35.0-39.9) with comorbidity ([place]) 02/28/2021   Marijuana use 02/28/2021   Chronic lower extremity pain (2ry area of Pain) (Bilateral) (L>R) 02/28/2021   Chronic hip pain (3ry area of Pain) (Bilateral) (L>R) 02/28/2021   Chronic groin pain (Left) 02/28/2021   Impaired range of motion of hip 02/28/2021   PAD (peripheral artery disease) ([place]) 02/12/2021   Peripheral arterial disease ([place]) 02/06/2021   Decreased dorsalis pedis pulse 01/19/2021   Diabetes mellitus ([place]) 01/19/2021   Bipolar disorder ([place])  01/17/2021   Pseudoarthrosis of lumbar spine 12/01/2020   Chronic low back pain (1ry area of Pain) (Bilateral) (L>R) w/o sciatica 09/06/2020   Pain in thoracic spine 08/03/2020   Ulnar 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 of left upper extremity 08/09/2019   Pincer nail deformity 05/19/2019   Excessive daytime sleepiness 03/15/2019   Type 2 diabetes mellitus with hyperglycemia, with long-term current use of insulin ([place]) 03/08/2019   Type 2 diabetes mellitus with diabetic polyneuropathy, with long-term current use of insulin ([place]) 03/08/2019   Diabetic neuropathy with neurologic complication ([place]) 46/96/2952   Claudication of both lower extremities ([place]) 02/04/2019   RLS (restless legs syndrome) 02/04/2019   Sleep deprivation 02/04/2019   Hot flashes 02/04/2019   Neck pain 02/04/2019   Chronic migraine without aura, with intractable migraine, so stated, with status migrainosus 01/27/2019   Chronic knee pain (4th area of Pain) (Bilateral) (L>R) 06/18/2018   Radiculopathy 02/11/2018   Diabetic ketoacidosis ([place]) 03/24/2016   Dermatitis 03/24/2016   HTN (hypertension) 03/24/2016   HLD (hyperlipidemia) 03/24/2016   DM (diabetes mellitus) ([place]) 03/24/2016   Chronic daily headache 11/23/2012   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    REFERRING DIAG: Low back pain and rib contusion   THERAPY DIAG:  Other low back pain  Pain in left leg  Muscle weakness (generalized)  Rationale for Evaluation and Treatment Rehabilitation  PERTINENT HISTORY: Previous lumbar fusions   PRECAUTIONS: None   SUBJECTIVE: Patient reports she feels like she is still worsening, she feels like there is a lot of pressure coming from her lower back to her upper back and head that is causing a headache. She is also having right foot pain.   PAIN:  Are you having pain? Yes:  NPRS scale: 10/10 Pain location: Headache, left lower back, left buttocks and left leg Pain description: Stabbing, discomfort,  aching, needles Aggravating factors: Sitting, bending, lifting, twisting Relieving factors: Ice/heat, medication, espson salt baths   OBJECTIVE: (objective measures completed at initial evaluation unless otherwise dated) PATIENT SURVEYS:  FOTO 33% functional status 10/30/2021: 26% functional status   SENSATION: WFL, patient reports tingling left leg but no sensation deficits noted   MUSCLE LENGTH: Left hip flexion limitation, piriformis limitation, hamstring grossly WFL   POSTURE:  Rounded shoulders   PALPATION: Tenderness generalized lumbar spine and left gluteal region   LUMBAR ROM:    Active  A/PROM  eval  Flexion Fingertips to toes, minimal lumbar flexion  Extension 50%  Right lateral flexion 75%  Left lateral flexion 75%  Right rotation 75%  Left rotation 75%    LOWER EXTREMITY ROM:                          Hip PROM grossly WFL, patient does report anterior hip pain at end range flexion   LOWER EXTREMITY MMT:  MMT Right eval Left eval Rt / Lt 10/16/2021 Rt / Lt 10/23/2021  Hip flexion 4- 4- 4- / 4- 4 / 4-  Hip extension 4- 4- 4- / 4-   Hip abduction 4- 4- 4- / 4- 4- / 4-  Knee flexion 4 4-    Knee extension 4 4-      LUMBAR SPECIAL TESTS:  SLR positive on left   FUNCTIONAL TESTS:  Not assessed   GAIT: Assistive device utilized: None Level of assistance: Complete Independence    TODAY's TREATMENT:  [facility] Adult PT Treatment:                                                DATE: 11/07/21 Therapeutic Exercise: Nustep L5 x 5 minutes with UE/LE while taking subjective LTR x 10 Single knee to chest stretch 2 x 20 sec each  Piriformis stretch 2 x 20 sec each Posterior pelvic tilt 10 x 10 sec Seated hamstring stretch 2 x 20 sec each Seated lumbar flexion physioball rollout 10 x 5 sec   [facility] Adult PT Treatment:                                                DATE: 10/30/21 Therapeutic Exercise: Nustep L5 x 5 minutes  Standing hip abduction 10 x 2  each  Sink Squats x 5 Seated hamstring stretch Seated figure 4 - push and  pull  Seated hamstring stretch - foot in chair  STS x 10 from mat  Physioball roll outs forward and lateral  PPT x 10  PPT to bridge x 10  [facility] Adult PT Treatment:                                                DATE: 10/23/21 Therapeutic Exercise: Nustep L6 x 5 min with UE/LE while taking subjective LTR x 10 Piriformis stretch 3 x 20 sec each SLR 2 x 10 each Bridge 2 x 8 with 5 sec hold Hooklying unilateral clamshell with red 2 x 10 each PPT with alternating march with red 2 x 10 each Sidelying hip abduction 2 x 10 each Seated hamstring curl with red 2 x 10 each LAQ with 4# 2 x 10 each Seated lumbar flexion physioball rollout 10 x 5 sec   PATIENT EDUCATION:  Education details: HEP Person educated: Patient Education method: Consulting civil engineer, Demonstration, Corporate treasurer cues, Verbal cues Education comprehension: verbalized understanding, returned demonstration, verbal cues required, tactile cues required, and needs further education   HOME EXERCISE PROGRAM: Access Code: [email or web address]     ASSESSMENT: CLINICAL IMPRESSION: Patient with poor tolerance for therapy this visit, no adverse effects reported. She arrived reporting 10/10 headache and therapy shortened this visit due to patient reporting headache worsening with further activity. Therapy attempted to focus on gentle lumbar mobility and stretching. She continues to report low back and left hip pain with exercises. No changes were made to HEP this visit, and she was encouraged to perform as tolerated and through comfortable ranges at home. If she continues to report worsening symptoms with therapy then will instructed patient  to follow-up with referring provided. Patient would benefit from continued skilled PT to progress her mobility and strength in order to reduce pain and maximize functional ability.     OBJECTIVE IMPAIRMENTS decreased activity tolerance, decreased  ROM, decreased strength, impaired flexibility, postural dysfunction, and pain.    ACTIVITY LIMITATIONS carrying, lifting, bending, sitting, standing, squatting, hygiene/grooming, and locomotion level   PARTICIPATION LIMITATIONS: meal prep, cleaning, laundry, shopping, community activity, and yard work   [place], Past/current experiences, and Time since onset of injury/illness/exacerbation are also affecting patient's functional outcome.      GOALS: Goals reviewed with patient? Yes   SHORT TERM GOALS: Target date: 11/01/2021   Patient will be I with initial HEP in order to progress with therapy. Baseline: HEP provided at eval Goal status: ONGOING   2.  PT will review FOTO with patient by 3rd visit in order to understand expected progress and outcome with therapy. Baseline: FOTO assessed at eval Goal status: MET   LONG TERM GOALS: Target date: 11/29/2021   Patient will be I with final HEP to maintain progress from PT. Baseline: HEP provided at eval Goal status: INITIAL   2.  Patient will report >/= 44% status on FOTO to indicate improved functional ability. Baseline: 33% Status: 26% Goal status: ONGOING   3.  Patient will demonstrate >/= 4/5 MMT hip strength to improve standing and walking tolerance. Baseline: grossly 4-/5 MMT Goal status: INITIAL   4.  Patient will report pain level with activity </= 2/10 in order to reduce functional limitations Baseline: pain level with activity >/= 4/5 MMT Goal status: INITIAL     PLAN: PT FREQUENCY: 1x/week   PT DURATION: 8 weeks   PLANNED INTERVENTIONS: Therapeutic exercises, Therapeutic activity, Neuromuscular re-education, Balance training, Gait training, Patient/Family education, Self Care, Joint mobilization, Joint manipulation, Aquatic Therapy, Dry Needling, Electrical stimulation, Cryotherapy, Moist heat, Manual therapy, and Re-evaluation.   PLAN FOR NEXT SESSION: If patient continues to report worsening  with therapy will plan discharge and follow-up with referring provider, review HEP and progress PRN, LAD for left leg, progress core and hip strengthening, FOTO 6th visit (completed on 5th)        Hilda Blades, PT, DPT, LAT, ATC 11/07/21  5:28 PM Phone: [phone] Fax: [phone]

## 2021-11-12 ENCOUNTER — Ambulatory Visit: Payer: Medicare HMO | Admitting: Physical Therapy

## 2021-11-12 DIAGNOSIS — M6281 Muscle weakness (generalized): Secondary | ICD-10-CM | POA: Diagnosis not present

## 2021-11-12 DIAGNOSIS — M79605 Pain in left leg: Secondary | ICD-10-CM

## 2021-11-12 DIAGNOSIS — M5459 Other low back pain: Secondary | ICD-10-CM

## 2021-11-12 NOTE — Therapy (Addendum)
OUTPATIENT PHYSICAL THERAPY TREATMENT NOTE  DISCHARGE   Patient Name: Autumn Valenzuela MRN: [id number] DOB:Mar 12, 1960, 61 y.o., female Today's Date: 11/12/2021  PCP: Flossie Buffy, NP   REFERRING PROVIDER: Melrose Nakayama, MD   END OF SESSION:   PT End of Session - 11/12/21 0802     Visit Number 7    Number of Visits 9    Date for PT Re-Evaluation 11/29/21    Authorization Type [employer] MEDICARE    PT Start Time 0802    PT Stop Time 0828    PT Time Calculation (min) 26 min                Past Medical History:  Diagnosis Date   Allergy    Anemia    Anxiety    Arthritis    Asthma    Back pain    Bipolar disorder ([place])    CVA (cerebral infarction)    Depression    Diabetes mellitus without complication (HCC)    High cholesterol    Hypertension    Left leg pain    Migraine    Sleep apnea    does not use a cpap   Stroke ([place]) 2007   no lasting weakness   Past Surgical History:  Procedure Laterality Date   ABDOMINAL AORTOGRAM W/LOWER EXTREMITY N/A 02/08/2021   Procedure: ABDOMINAL AORTOGRAM W/LOWER EXTREMITY;  Surgeon: Marty Heck, MD;  Location: [facility];  Service: Cardiovascular;  Laterality: N/A;   ABDOMINAL HYSTERECTOMY     ANTERIOR LATERAL LUMBAR FUSION WITH PERCUTANEOUS SCREW 1 LEVEL Left 02/11/2018   Procedure: LEFT LATERAL LUMBAR  FOUR-FIVE INTERBODY FUSION WITH INSTRUMENTATION AND ALLOGRAFT;  Surgeon: Phylliss Bob, MD;  Location: [facility];  Service: Orthopedics;  Laterality: Left;  LEFT LATERAL LUMBAR  FOUR-FIVE INTERBODY FUSION WITH INSTRUMENTATION AND ALLOGRAFT   BACK SURGERY     Lumbar fusion L5-S1   DIABETES     ENDARTERECTOMY FEMORAL Left 02/12/2021   Procedure: LEFT COMMON FEMORAL ENDARTERECTOMY WITH BOVINE PATCH;  Surgeon: Marty Heck, MD;  Location: [facility];  Service: Vascular;  Laterality: Left;   EYE SURGERY Bilateral    INGUINAL LYMPH NODE BIOPSY Left 07/25/2021   Procedure: EXCISIONAL BIOPSY LEFT INGUINAL LYMPH  NODES;  Surgeon: Coralie Keens, MD;  Location: [facility];  Service: General;  Laterality: Left;   KNEE SURGERY     l 4-l5 status post lateral posterior fusion January 30,2020     NEUROFORAMINAL STENOSIS Left    Severe L4-L5, involving the level above previous fusion.   PARTIAL HYSTERECTOMY     RADICULOPATHY Left    L4, Secondary to an L4-L5 Spondylolisthesis   SHOULDER SURGERY     Patient Active Problem List   Diagnosis Date Noted   Spondylosis without myelopathy or radiculopathy, lumbosacral region 06/26/2021   DDD (degenerative disc disease), lumbosacral 06/26/2021   Lumbar facet syndrome 05/14/2021   History of diabetic ketoacidosis 04/16/2021   Abnormal CT scan, lumbar spine (10/11/2020) 04/16/2021   Chronic pain syndrome 02/28/2021   Pharmacologic therapy 02/28/2021   Disorder of skeletal system 02/28/2021   Problems influencing health status 02/28/2021   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   History of lumbar fusion 02/28/2021   Osteoarthritis of glenohumeral joint (Right) 02/28/2021   Osteoarthritis of AC (acromioclavicular) joint (Right) 02/28/2021   Lumbosacral facet arthropathy (Multilevel) 02/28/2021   History of lumbar laminectomy (L4-S1) 02/28/2021   Osteoarthritis of knee (Left) 02/28/2021   Severe obesity (BMI 35.0-39.9) with  comorbidity ([place]) 02/28/2021   Marijuana use 02/28/2021   Chronic lower extremity pain (2ry area of Pain) (Bilateral) (L>R) 02/28/2021   Chronic hip pain (3ry area of Pain) (Bilateral) (L>R) 02/28/2021   Chronic groin pain (Left) 02/28/2021   Impaired range of motion of hip 02/28/2021   PAD (peripheral artery disease) ([place]) 02/12/2021   Peripheral arterial disease ([place]) 02/06/2021   Decreased dorsalis pedis pulse 01/19/2021   Diabetes mellitus ([place]) 01/19/2021   Bipolar disorder ([place]) 01/17/2021   Pseudoarthrosis of lumbar spine 12/01/2020   Chronic low back pain (1ry area of Pain) (Bilateral) (L>R) w/o sciatica  09/06/2020   Pain in thoracic spine 08/03/2020   Ulnar 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 of left upper extremity 08/09/2019   Pincer nail deformity 05/19/2019   Excessive daytime sleepiness 03/15/2019   Type 2 diabetes mellitus with hyperglycemia, with long-term current use of insulin ([place]) 03/08/2019   Type 2 diabetes mellitus with diabetic polyneuropathy, with long-term current use of insulin ([place]) 03/08/2019   Diabetic neuropathy with neurologic complication ([place]) 60/63/0160   Claudication of both lower extremities ([place]) 02/04/2019   RLS (restless legs syndrome) 02/04/2019   Sleep deprivation 02/04/2019   Hot flashes 02/04/2019   Neck pain 02/04/2019   Chronic migraine without aura, with intractable migraine, so stated, with status migrainosus 01/27/2019   Chronic knee pain (4th area of Pain) (Bilateral) (L>R) 06/18/2018   Radiculopathy 02/11/2018   Diabetic ketoacidosis ([place]) 03/24/2016   Dermatitis 03/24/2016   HTN (hypertension) 03/24/2016   HLD (hyperlipidemia) 03/24/2016   DM (diabetes mellitus) ([place]) 03/24/2016   Chronic daily headache 11/23/2012   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    REFERRING DIAG: Low back pain and rib contusion   THERAPY DIAG:  Other low back pain  Pain in left leg  Muscle weakness (generalized)  Rationale for Evaluation and Treatment Rehabilitation  PERTINENT HISTORY: Previous lumbar fusions   PRECAUTIONS: None   SUBJECTIVE: I am still having headaches constantly.  Also having lower back pain and left leg pain. I had difficulty sleeping the weekend. The left hip throbs.   PAIN:  Are you having pain? Yes:  NPRS scale: 8/10 Pain location: Headache, left lower back, left buttocks and left leg Pain description: Stabbing, discomfort, aching, needles Aggravating factors: Sitting, bending, lifting, twisting Relieving factors: Ice/heat, medication, espson salt baths   OBJECTIVE: (objective measures completed at initial  evaluation unless otherwise dated) PATIENT SURVEYS:  FOTO 33% functional status 10/30/2021: 26% functional status   SENSATION: WFL, patient reports tingling left leg but no sensation deficits noted   MUSCLE LENGTH: Left hip flexion limitation, piriformis limitation, hamstring grossly WFL   POSTURE:  Rounded shoulders   PALPATION: Tenderness generalized lumbar spine and left gluteal region   LUMBAR ROM:    Active  A/PROM  eval   Flexion Fingertips to toes, minimal lumbar flexion To toes, increased left lumbar pain  Extension 50% 50% pain  Right lateral flexion 75% 100% pain  Left lateral flexion 75% 100% pain  Right rotation 75% 75% pain  Left rotation 75% 100% pain    LOWER EXTREMITY ROM:                          Hip PROM grossly WFL, patient does report anterior hip pain at end range flexion   LOWER EXTREMITY MMT:     MMT Right eval Left eval Rt / Lt 10/16/2021 Rt / Lt 10/23/2021 Rt/Lt 11/12/21  Hip flexion 4- 4- 4- /  4- 4 / 4- 4- / 4-  Hip extension 4- 4- 4- / 4-    Hip abduction 4- 4- 4- / 4- 4- / 4- 4-/ 4-  Knee flexion 4 4-     Knee extension 4 4-       LUMBAR SPECIAL TESTS:  SLR positive on left   FUNCTIONAL TESTS:  Not assessed   GAIT: Assistive device utilized: None Level of assistance: Complete Independence    TODAY's TREATMENT:  [facility] Adult PT Treatment:                                                DATE: 11/12/21 Therapeutic Exercise: Nustep L5 x 5 minutes with UE/LE while taking subjective Standing 3 way hip at counter x 10 each , bilat  Standing heel raises  Sink squats  MMT/AROM Piriformis stretch SLR with ab brace  Bridge    LTR x 10 Single knee to chest stretch 2 x 20 sec each  Piriformis stretch 2 x 20 sec each Posterior pelvic tilt 10 x 10 sec Seated hamstring stretch 2 x 20 sec each Seated lumbar flexion physioball rollout 10 x 5 sec  [facility] Adult PT Treatment:                                                DATE:  11/07/21 Therapeutic Exercise: Nustep L5 x 5 minutes with UE/LE while taking subjective LTR x 10 Single knee to chest stretch 2 x 20 sec each  Piriformis stretch 2 x 20 sec each Posterior pelvic tilt 10 x 10 sec Seated hamstring stretch 2 x 20 sec each Seated lumbar flexion physioball rollout 10 x 5 sec   [facility] Adult PT Treatment:                                                DATE: 10/30/21 Therapeutic Exercise: Nustep L5 x 5 minutes  Standing hip abduction 10 x 2 each  Sink Squats x 5 Seated hamstring stretch Seated figure 4 - push and  pull  Seated hamstring stretch - foot in chair  STS x 10 from mat  Physioball roll outs forward and lateral  PPT x 10  PPT to bridge x 10  [facility] Adult PT Treatment:                                                DATE: 10/23/21 Therapeutic Exercise: Nustep L6 x 5 min with UE/LE while taking subjective LTR x 10 Piriformis stretch 3 x 20 sec each SLR 2 x 10 each Bridge 2 x 8 with 5 sec hold Hooklying unilateral clamshell with red 2 x 10 each PPT with alternating march with red 2 x 10 each Sidelying hip abduction 2 x 10 each Seated hamstring curl with red 2 x 10 each LAQ with 4# 2 x 10 each Seated lumbar flexion physioball rollout 10 x 5 sec   PATIENT EDUCATION:  Education details: HEP  Person educated: Patient Education method: Explanation, Demonstration, Tactile cues, Verbal cues Education comprehension: verbalized understanding, returned demonstration, verbal cues required, tactile cues required, and needs further education   HOME EXERCISE PROGRAM: Access Code: [email or web address]     ASSESSMENT: CLINICAL IMPRESSION: Patient arrives with c/o continued high levels of lumbar and Lt leg pain affecting sleep and ADLs. She also reports continued headaches/migraines daily. She did call MD and they are going to schedule her for a Lumbar MRI due to her lack of progress with PT. Her AROM has somewhat improved with pain in all      OBJECTIVE IMPAIRMENTS  decreased activity tolerance, decreased ROM, decreased strength, impaired flexibility, postural dysfunction, and pain.    ACTIVITY LIMITATIONS carrying, lifting, bending, sitting, standing, squatting, hygiene/grooming, and locomotion level   PARTICIPATION LIMITATIONS: meal prep, cleaning, laundry, shopping, community activity, and yard work   [place], Past/current experiences, and Time since onset of injury/illness/exacerbation are also affecting patient's functional outcome.      GOALS: Goals reviewed with patient? Yes   SHORT TERM GOALS: Target date: 11/01/2021   Patient will be I with initial HEP in order to progress with therapy. Baseline: HEP provided at eval Goal status: MET   2.  PT will review FOTO with patient by 3rd visit in order to understand expected progress and outcome with therapy. Baseline: FOTO assessed at eval Goal status: MET   LONG TERM GOALS: Target date: 11/29/2021   Patient will be I with final HEP to maintain progress from PT. Baseline: HEP provided at eval Goal status: MET   2.  Patient will report >/= 44% status on FOTO to indicate improved functional ability. Baseline: 33% Status: 26% Goal status: NOT MET   3.  Patient will demonstrate >/= 4/5 MMT hip strength to improve standing and walking tolerance. Baseline: grossly 4-/5 MMT Goal status: NOT MET   4.  Patient will report pain level with activity </= 2/10 in order to reduce functional limitations Baseline: pain level with activity >/= 4/10 Goal status: NOT MET     PLAN: PT FREQUENCY: 1x/week   PT DURATION: 8 weeks   PLANNED INTERVENTIONS: Therapeutic exercises, Therapeutic activity, Neuromuscular re-education, Balance training, Gait training, Patient/Family education, Self Care, Joint mobilization, Joint manipulation, Aquatic Therapy, Dry Needling, Electrical stimulation, Cryotherapy, Moist heat, Manual therapy, and Re-evaluation.   PLAN FOR NEXT SESSION: IN/A discharge  to HEP and return to MD for further testing        Hessie Diener, PTA 11/12/21 8:29 AM Phone: [phone] Fax: [phone]       PHYSICAL THERAPY DISCHARGE SUMMARY  Visits from Start of Care: 7  Current functional level related to goals / functional outcomes: See above   Remaining deficits: See above   Education / Equipment: HEP   Patient agrees to discharge. Patient goals were not met. Patient is being discharged due to lack of progress.  Hilda Blades, PT, DPT, LAT, ATC 11/12/21  10:03 AM Phone: [phone] Fax: [phone]

## 2021-11-13 DIAGNOSIS — F319 Bipolar disorder, unspecified: Secondary | ICD-10-CM

## 2021-11-13 DIAGNOSIS — Z794 Long term (current) use of insulin: Secondary | ICD-10-CM | POA: Diagnosis not present

## 2021-11-13 DIAGNOSIS — E1169 Type 2 diabetes mellitus with other specified complication: Secondary | ICD-10-CM | POA: Diagnosis not present

## 2021-11-13 DIAGNOSIS — E785 Hyperlipidemia, unspecified: Secondary | ICD-10-CM

## 2021-11-13 DIAGNOSIS — R69 Illness, unspecified: Secondary | ICD-10-CM | POA: Diagnosis not present

## 2021-11-14 ENCOUNTER — Ambulatory Visit (INDEPENDENT_AMBULATORY_CARE_PROVIDER_SITE_OTHER): Payer: Medicare HMO

## 2021-11-14 VITALS — Ht 61.0 in | Wt 198.0 lb

## 2021-11-14 DIAGNOSIS — Z0000 Encounter for general adult medical examination without abnormal findings: Secondary | ICD-10-CM

## 2021-11-14 NOTE — Progress Notes (Signed)
Subjective:   Autumn Valenzuela is a 61 y.o. female who presents for Medicare Annual (Subsequent) preventive examination.   I connected with  Autumn Valenzuela on 11/14/21 by a audio enabled telemedicine application and verified that I am speaking with the correct person using two identifiers.  Patient Location: Home  Provider Location: Home Office  I discussed the limitations of evaluation and management by telemedicine. The patient expressed understanding and agreed to proceed.  Review of Systems     Cardiac Risk Factors include: advanced age (>98mn, >>39women);diabetes mellitus;dyslipidemia;hypertension     Objective:    Today's Vitals   11/14/21 0818  Weight: 198 lb (89.8 kg)  Height: _0  (1.549 m)   Body mass index is 37.41 kg/m.     11/14/2021    8:22 AM 10/04/2021    8:45 AM 07/25/2021   10:57 AM 02/08/2021   11:37 AM 12/01/2020    6:04 AM 11/28/2020    8:37 AM 11/06/2020   11:57 AM  Advanced Directives  Does Patient Have a Medical Advance Directive? _1  No No  Would patient like information on creating a medical advance directive? No - Patient declined No - Patient declined No - Patient declined Yes (MAU/Ambulatory/Procedural Areas - Information given) No - Patient declined  No - Patient declined    Current Medications (verified) Outpatient Encounter Medications as of 11/14/2021  Medication Sig   Accu-Chek FastClix Lancets MISC TEST 4 TIMES DAILY. DX E11.9   albuterol (PROVENTIL) (2.5 MG/3ML) 0.083% nebulizer solution Take 2.5 mg by nebulization every 6 (six) hours as needed for wheezing.   albuterol (VENTOLIN HFA) 108 (90 Base) MCG/ACT inhaler Inhale 2 puffs into the lungs daily as needed for wheezing or shortness of breath.   aspirin EC 81 MG EC tablet Take 1 tablet (81 mg total) by mouth daily at 6 (six) AM. Swallow whole.   Blood Glucose Calibration (ACCU-CHEK GUIDE CONTROL) LIQD 1 each by In Vitro route every 30 (thirty) days. Needs Accu-check guide  monitor system. DX. E11.9 (Patient taking differently: 1 each by In Vitro route every 30 (thirty) days. Needs Accu-check guide monitor system. DX. E11.9 2 times daily)   blood glucose meter kit and supplies Dispense based on patient and insurance preference. Use up to four times daily as directed. (FOR ICD-10 E10.9, E11.9).   buPROPion (WELLBUTRIN) 75 MG tablet Take 1 tablet (75 mg total) by mouth 2 (two) times daily.   Cholecalciferol (VITAMIN D3) 1.25 MG (50000 UT) CAPS Take 1 capsule by mouth once a week.   Continuous Blood Gluc Sensor (DEXCOM G6 SENSOR) MISC 1 Device by Does not apply route as directed.   Continuous Blood Gluc Transmit (DEXCOM G6 TRANSMITTER) MISC 1 Device by Does not apply route as directed.   diclofenac Sodium (VOLTAREN) 1 % GEL Apply 2 g topically 4 (four) times daily. (Patient taking differently: Apply 2 g topically 4 (four) times daily as needed (pain).)   DULoxetine (CYMBALTA) 60 MG capsule Take 1 capsule (60 mg total) by mouth 2 (two) times daily.   empagliflozin (JARDIANCE) 25 MG TABS tablet Take 1 tablet (25 mg total) by mouth daily.   ferrous sulfate 325 (65 FE) MG tablet Take 1 tablet (325 mg total) by mouth every other day. (Patient taking differently: Take 325 mg by mouth daily.)   fluticasone (FLONASE) 50 MCG/ACT nasal spray Place 2 sprays into both nostrils daily as needed for allergies.   insulin aspart (NOVOLOG FLEXPEN) 100 UNIT/ML  FlexPen Max daily 30 units (Patient taking differently: Inject 1-4 Units into the skin 3 (three) times daily as needed (blood sugar over 150). Max daily 30 units)   Insulin Glargine (BASAGLAR KWIKPEN) 100 UNIT/ML Inject 18 Units into the skin daily.   Insulin Pen Needle 31G X 5 MM MISC 1 Units by Does not apply route in the morning, at noon, in the evening, and at bedtime.   metFORMIN (GLUCOPHAGE-XR) 500 MG 24 hr tablet Take 1 tablet (500 mg total) by mouth in the morning and at bedtime.   ONETOUCH VERIO test strip CHECK UP TO 4 TIMES A  DAY AS DIRECTED   pregabalin (LYRICA) 75 MG capsule Take 1 capsule (75 mg total) by mouth 2 (two) times daily.   rosuvastatin (CRESTOR) 20 MG tablet TAKE 1 TABLET(20 MG) BY MOUTH DAILY   Semaglutide,0.25 or 0.5MG/DOS, (OZEMPIC, 0.25 OR 0.5 MG/DOSE,) 2 MG/3ML SOPN Inject 0.5 mg into the skin once a week.   No facility-administered encounter medications on file as of 11/14/2021.    Allergies (verified) Patient has no known allergies.   History: Past Medical History:  Diagnosis Date   Allergy    Anemia    Anxiety    Arthritis    Asthma    Back pain    Bipolar disorder ([place])    CVA (cerebral infarction)    Depression    Diabetes mellitus without complication (HCC)    High cholesterol    Hypertension    Left leg pain    Migraine    Sleep apnea    does not use a cpap   Stroke ([place]) 2007   no lasting weakness   Past Surgical History:  Procedure Laterality Date   ABDOMINAL AORTOGRAM W/LOWER EXTREMITY N/A 02/08/2021   Procedure: ABDOMINAL AORTOGRAM W/LOWER EXTREMITY;  Surgeon: Marty Heck, MD;  Location: [facility];  Service: Cardiovascular;  Laterality: N/A;   ABDOMINAL HYSTERECTOMY     ANTERIOR LATERAL LUMBAR FUSION WITH PERCUTANEOUS SCREW 1 LEVEL Left 02/11/2018   Procedure: LEFT LATERAL LUMBAR  FOUR-FIVE INTERBODY FUSION WITH INSTRUMENTATION AND ALLOGRAFT;  Surgeon: Phylliss Bob, MD;  Location: [facility];  Service: Orthopedics;  Laterality: Left;  LEFT LATERAL LUMBAR  FOUR-FIVE INTERBODY FUSION WITH INSTRUMENTATION AND ALLOGRAFT   BACK SURGERY     Lumbar fusion L5-S1   DIABETES     ENDARTERECTOMY FEMORAL Left 02/12/2021   Procedure: LEFT COMMON FEMORAL ENDARTERECTOMY WITH BOVINE PATCH;  Surgeon: Marty Heck, MD;  Location: [facility];  Service: Vascular;  Laterality: Left;   EYE SURGERY Bilateral    INGUINAL LYMPH NODE BIOPSY Left 07/25/2021   Procedure: EXCISIONAL BIOPSY LEFT INGUINAL LYMPH NODES;  Surgeon: Coralie Keens, MD;  Location: [facility];  Service: General;  Laterality: Left;   KNEE SURGERY     l 4-l5 status post lateral posterior fusion January 30,2020     NEUROFORAMINAL STENOSIS Left    Severe L4-L5, involving the level above previous fusion.   PARTIAL HYSTERECTOMY     RADICULOPATHY Left    L4, Secondary to an L4-L5 Spondylolisthesis   SHOULDER SURGERY     Family History  Problem Relation Age of Onset   Diabetes Mother    Stroke Father    Cancer Paternal Aunt    Cancer Paternal Aunt    Diabetes Brother    Colon cancer Neg Hx    Esophageal cancer Neg Hx    Rectal cancer Neg Hx    Stomach cancer Neg Hx  Social History   Socioeconomic History   Marital status: Single    Spouse name: Not on file   Number of children: 3   Years of education: 12   Highest education level: Not on file  Occupational History   Not on file  Tobacco Use   Smoking status: Former    Types: Cigarettes    Quit date: 12/13/2015    Years since quitting: 5.9   Smokeless tobacco: Never  Vaping Use   Vaping Use: Never used  Substance and Sexual Activity   Alcohol use: Not Currently    Comment: Occas   Drug use: Not Currently    Types: Marijuana   Sexual activity: Not Currently    Comment: 1st intercourse 61 yo-Fewer than 5 partners  Other Topics Concern   Not on file  Social History Narrative   Patient is single with 3 children.   Patient is right handed.   Patient has a high school education.   Patient drinks at least 3 cups of caffeine daily.   Social Determinants of Health   Financial Resource Strain: Low Risk  (11/14/2021)   Overall Financial Resource Strain (CARDIA)    Difficulty of Paying Living Expenses: Not hard at all  Food Insecurity: No Food Insecurity (11/14/2021)   Hunger Vital Sign    Worried About Running Out of Food in the Last Year: Never true    Ran Out of Food in the Last Year: Never true  Transportation Needs: No Transportation Needs (11/14/2021)   PRAPARE - Radiographer, therapeutic (Medical): No    Lack of Transportation (Non-Medical): No  Physical Activity: Sufficiently Active (11/14/2021)   Exercise Vital Sign    Days of Exercise per Week: 7 days    Minutes of Exercise per Session: 30 min  Stress: No Stress Concern Present (11/14/2021)   [place]    Feeling of Stress : Only a little  Social Connections: Moderately Integrated (11/14/2021)   Social Connection and Isolation Panel [NHANES]    Frequency of Communication with Friends and Family: More than three times a week    Frequency of Social Gatherings with Friends and Family: More than three times a week    Attends Religious Services: More than 4 times per year    Active Member of [employer] or Organizations: Yes    Attends Music therapist: More than 4 times per year    Marital Status: Divorced    Tobacco Counseling Counseling given: Not Answered   Clinical Intake:  Pre-visit preparation completed: Yes  Pain : No/denies pain     Nutritional Risks: None Diabetes: No  How often do you need to have someone help you when you read instructions, pamphlets, or other written materials from your doctor or pharmacy?: 1 - Never  Diabetic?yes Nutrition Risk Assessment:  Has the patient had any N/V/D within the last 2 months?  No  Does the patient have any non-healing wounds?  No  Has the patient had any unintentional weight loss or weight gain?  No   Diabetes:  Is the patient diabetic?  Yes  If diabetic, was a CBG obtained today?  No  Did the patient bring in their glucometer from home?  No  How often do you monitor your CBG's? Daily .   Financial Strains and Diabetes Management:  Are you having any financial strains with the device, your supplies or your medication? No .  Does the  patient want to be seen by Chronic Care Management for management of their diabetes?  No  Would the patient like to be referred to a  Nutritionist or for Diabetic Management?  No   Diabetic Exams:  Diabetic Eye Exam: Completed 08/2021 Diabetic Foot Exam: Overdue, Pt has been advised about the importance in completing this exam. Pt is scheduled for diabetic foot exam on next office visit .   Interpreter Needed?: No  Information entered by :: Jadene Pierini, LPN   Activities of Daily Living    11/14/2021    8:21 AM 07/25/2021   11:01 AM  In your present state of health, do you have any difficulty performing the following activities:  Hearing? 0 0  Vision? 0 0  Difficulty concentrating or making decisions? 0 0  Walking or climbing stairs? 0 0  Dressing or bathing? 0 0  Doing errands, shopping? 0   Preparing Food and eating ? N   Using the Toilet? N   In the past six months, have you accidently leaked urine? N   Do you have problems with loss of bowel control? N   Managing your Medications? N   Managing your Finances? N   Housekeeping or managing your Housekeeping? N     Patient Care Team: Nche, Charlene Brooke, NP as PCP - General (Internal Medicine) Germaine Pomfret, [facility] as Pharmacist (Pharmacist)  Indicate any recent Medical Services you may have received from other than [facility] providers in the past year (date may be approximate).     Assessment:   This is a routine wellness examination for Shirin.  Hearing/Vision screen Vision Screening - Comments:: Annual eye exams wear glasses   Dietary issues and exercise activities discussed: Current Exercise Habits: Home exercise routine, Type of exercise: walking, Time (Minutes): 30, Frequency (Times/Week): 7, Weekly Exercise (Minutes/Week): 210, Intensity: Mild, Exercise limited by: None identified   Goals Addressed             This Visit's Progress    Increase physical activity   On track      Depression Screen    11/14/2021    8:20 AM 08/27/2021   11:35 AM 06/27/2021   10:40 AM 05/30/2021   10:05 AM 05/02/2021   10:17 AM 10/24/2020    8:51 AM  09/22/2019    3:26 PM  PHQ 2/9 Scores  PHQ - 2 Score _0 PHQ- 9 Score  _1 Fall Risk    11/14/2021    8:19 AM 08/27/2021   10:48 AM 08/07/2021    2:54 PM 07/24/2021    8:46 AM 07/12/2021   11:58 AM  Fall Risk   Falls in the past year? 0 0 0 0 0  Number falls in past yr: 0 0     Injury with Fall? 0 0     Risk for fall due to : No Fall Risks      Follow up Falls prevention discussed        [place]:  Any stairs in or around the home? Yes  If so, are there any without handrails? No  Home free of loose throw rugs in walkways, pet beds, electrical cords, etc? Yes  Adequate lighting in your home to reduce risk of falls? Yes   ASSISTIVE DEVICES UTILIZED TO PREVENT FALLS:  Life alert? No  Use of a cane, walker or w/c? Yes  Grab  bars in the bathroom? No  Shower chair or bench in shower? No  Elevated toilet seat or a handicapped toilet? No       08/18/2017    3:11 PM  MMSE - Mini Mental State Exam  Orientation to time 5  Orientation to Place 5  Registration 3  Attention/ Calculation 0  Recall 2  Recall-comments unable to recall 1 of 3 words  Language- name 2 objects 0  Language- repeat 1  Language- follow 3 step command 3  Language- read & follow direction 0  Write a sentence 0  Copy design 0  Total score 19        11/14/2021    8:22 AM  6CIT Screen  What Year? 0 points  What month? 0 points  What time? 0 points  Count back from 20 0 points  Months in reverse 0 points  Repeat phrase 0 points  Total Score 0 points    Immunizations Immunization History  Administered Date(s) Administered   Moderna Sars-Covid-2 Vaccination 07/20/2019, 08/20/2019   Pneumococcal Polysaccharide-23 08/25/2017   Zoster Recombinat (Shingrix) 01/19/2021, 03/19/2021    TDAP status: Due, Education has been provided regarding the importance of this vaccine. Advised may receive this vaccine at local pharmacy or Health Dept. Aware  to provide a copy of the vaccination record if obtained from local pharmacy or Health Dept. Verbalized acceptance and understanding.  Flu Vaccine status: Due, Education has been provided regarding the importance of this vaccine. Advised may receive this vaccine at local pharmacy or Health Dept. Aware to provide a copy of the vaccination record if obtained from local pharmacy or Health Dept. Verbalized acceptance and understanding.  Pneumococcal vaccine status: Up to date  Covid-19 vaccine status: Completed vaccines  Qualifies for Shingles Vaccine? Yes   Zostavax completed Yes   Shingrix Completed?: Yes  Screening Tests Health Maintenance  Topic Date Due   COVID-19 Vaccine (3 - Moderna risk series) 09/17/2019   INFLUENZA VACCINE  Never done   TETANUS/TDAP  08/28/2022 (Originally 09/06/1979)   Diabetic kidney evaluation - Urine ACR  01/17/2022   HEMOGLOBIN A1C  03/25/2022   FOOT EXAM  05/31/2022   MAMMOGRAM  06/30/2022   Diabetic kidney evaluation - GFR measurement  07/26/2022   OPHTHALMOLOGY EXAM  08/03/2022   Medicare Annual Wellness (AWV)  11/15/2022   COLONOSCOPY (Pts 45-65yr Insurance coverage will need to be confirmed)  11/29/2028   Hepatitis C Screening  Completed   HIV Screening  Completed   Zoster Vaccines- Shingrix  Completed   HPV VACCINES  Aged Out   PAP SMEAR-Modifier  Discontinued    Health Maintenance  Health Maintenance Due  Topic Date Due   COVID-19 Vaccine (3 - Moderna risk series) 09/17/2019   INFLUENZA VACCINE  Never done    Colorectal cancer screening: Type of screening: Colonoscopy. Completed 11/30/2018. Repeat every 10 years  Mammogram status: Completed 06/29/2021. Repeat every year  Bone Density status: Ordered Not of age . Pt provided with contact info and advised to call to schedule appt.  Lung Cancer Screening: (Low Dose CT Chest recommended if Age 848-80years, 30 pack-year currently smoking OR have quit w/in 15years.) does not qualify.   Lung  Cancer Screening Referral: n/a  Additional Screening:  Hepatitis C Screening: does not qualify;   Vision Screening: Recommended annual ophthalmology exams for early detection of glaucoma and other disorders of the eye. Is the patient up to date with their annual eye exam?  Yes  Who is the  provider or what is the name of the office in which the patient attends annual eye exams? Dr.Groat  If pt is not established with a provider, would they like to be referred to a provider to establish care? No .   Dental Screening: Recommended annual dental exams for proper oral hygiene  Community Resource Referral / Chronic Care Management: CRR required this visit?  No   CCM required this visit?  No      Plan:     I have personally reviewed and noted the following in the patient's chart:   Medical and social history Use of alcohol, tobacco or illicit drugs  Current medications and supplements including opioid prescriptions. Patient is not currently taking opioid prescriptions. Functional ability and status Nutritional status Physical activity Advanced directives List of other physicians Hospitalizations, surgeries, and ER visits in previous 12 months Vitals Screenings to include cognitive, depression, and falls Referrals and appointments  In addition, I have reviewed and discussed with patient certain preventive protocols, quality metrics, and best practice recommendations. A written personalized care plan for preventive services as well as general preventive health recommendations were provided to patient.     Daphane Shepherd, LPN   [id number]/[id number]   Nurse Notes: Due Flu /TDAP Vaccine

## 2021-11-14 NOTE — Patient Instructions (Signed)
[name] , Thank you for taking time to come for your Medicare Wellness Visit. I appreciate your ongoing commitment to your health goals. Please review the following plan we discussed and let me know if I can assist you in the future.   These are the goals we discussed:  Goals      Increase physical activity     Monitor and Manage My Blood Sugar-Diabetes Type 2     Timeframe:  Long-Range Goal Priority:  High Start Date: 05/03/21                            Expected End Date: 05/04/22                      Follow Up within 30 days   - check blood sugar four times daily, before meals and at bedtime - check blood sugar before and after exercise - check blood sugar if I feel it is too high or too low - take the blood sugar log to all doctor visits    Why is this important?   Checking your blood sugar at home helps to keep it from getting very high or very low.  Writing the results in a diary or log helps the doctor know how to care for you.  Your blood sugar log should have the time, date and the results.  Also, write down the amount of insulin or other medicine that you take.  Other information, like what you ate, exercise done and how you were feeling, will also be helpful.     Notes:      Patient Stated     Starting 08/18/2017, I will continue to take medications as prescribed.        This is a list of the screening recommended for you and due dates:  Health Maintenance  Topic Date Due   COVID-19 Vaccine (3 - Moderna risk series) 09/17/2019   Flu Shot  Never done   Tetanus Vaccine  08/28/2022*   Yearly kidney health urinalysis for diabetes  01/17/2022   Hemoglobin A1C  03/25/2022   Complete foot exam   05/31/2022   Mammogram  06/30/2022   Yearly kidney function blood test for diabetes  07/26/2022   Eye exam for diabetics  08/03/2022   Medicare Annual Wellness Visit  11/15/2022   Colon Cancer Screening  11/29/2028   Hepatitis C Screening: USPSTF Recommendation to screen -  Ages 18-79 yo.  Completed   HIV Screening  Completed   Zoster (Shingles) Vaccine  Completed   HPV Vaccine  Aged Out   Pap Smear  Discontinued  *Topic was postponed. The date shown is not the original due date.    Advanced directives: Advance directive discussed with you today. I have provided a copy for you to complete at home and have notarized. Once this is complete please bring a copy in to our office so we can scan it into your chart.   Conditions/risks identified: Aim for 30 minutes of exercise or brisk walking, 6-8 glasses of water, and 5 servings of fruits and vegetables each day.   Next appointment: Follow up in one year for your annual wellness visit.   Preventive Care 59 Years and Older, Female  Preventive care refers to lifestyle choices and visits with your health care provider that can promote health and wellness. What does preventive care include? A yearly physical exam. This is also called  an annual well check. Dental exams once or twice a year. Routine eye exams. Ask your health care provider how often you should have your eyes checked. Personal lifestyle choices, including: Daily care of your teeth and gums. Regular physical activity. Eating a healthy diet. Avoiding tobacco and drug use. Limiting alcohol use. Practicing safe sex. Taking low doses of aspirin every day. Taking vitamin and mineral supplements as recommended by your health care provider. What happens during an annual well check? The services and screenings done by your health care provider during your annual well check will depend on your age, overall health, lifestyle risk factors, and family history of disease. Counseling  Your health care provider may ask you questions about your: Alcohol use. Tobacco use. Drug use. Emotional well-being. Home and relationship well-being. Sexual activity. Eating habits. History of falls. Memory and ability to understand (cognition). Work and work  Statistician. Screening  You may have the following tests or measurements: Height, weight, and BMI. Blood pressure. Lipid and cholesterol levels. These may be checked every 5 years, or more frequently if you are over 59 years old. Skin check. Lung cancer screening. You may have this screening every year starting at age 59 if you have a 30-pack-year history of smoking and currently smoke or have quit within the past 15 years. Fecal occult blood test (FOBT) of the stool. You may have this test every year starting at age 59. Flexible sigmoidoscopy or colonoscopy. You may have a sigmoidoscopy every 5 years or a colonoscopy every 10 years starting at age 12. Prostate cancer screening. Recommendations will vary depending on your family history and other risks. Hepatitis C blood test. Hepatitis B blood test. Sexually transmitted disease (STD) testing. Diabetes screening. This is done by checking your blood sugar (glucose) after you have not eaten for a while (fasting). You may have this done every 1-3 years. Abdominal aortic aneurysm (AAA) screening. You may need this if you are a current or former smoker. Osteoporosis. You may be screened starting at age 59 if you are at high risk. Talk with your health care provider about your test results, treatment options, and if necessary, the need for more tests. Vaccines  Your health care provider may recommend certain vaccines, such as: Influenza vaccine. This is recommended every year. Tetanus, diphtheria, and acellular pertussis (Tdap, Td) vaccine. You may need a Td booster every 10 years. Zoster vaccine. You may need this after age 59. Pneumococcal 13-valent conjugate (PCV13) vaccine. One dose is recommended after age 59. Pneumococcal polysaccharide (PPSV23) vaccine. One dose is recommended after age 59. Talk to your health care provider about which screenings and vaccines you need and how often you need them. This information is not intended to replace  advice given to you by your health care provider. Make sure you discuss any questions you have with your health care provider. Document Released: 01/27/2015 Document Revised: 09/20/2015 Document Reviewed: 11/01/2014 Elsevier Interactive Patient Education  2017 [place] Prevention in the Home Falls can cause injuries. They can happen to people of all ages. There are many things you can do to make your home safe and to help prevent falls. What can I do on the outside of my home? Regularly fix the edges of walkways and driveways and fix any cracks. Remove anything that might make you trip as you walk through a door, such as a raised step or threshold. Trim any bushes or trees on the path to your home. Use bright outdoor lighting.  Clear any walking paths of anything that might make someone trip, such as rocks or tools. Regularly check to see if handrails are loose or broken. Make sure that both sides of any steps have handrails. Any raised decks and porches should have guardrails on the edges. Have any leaves, snow, or ice cleared regularly. Use sand or salt on walking paths during winter. Clean up any spills in your garage right away. This includes oil or grease spills. What can I do in the bathroom? Use night lights. Install grab bars by the toilet and in the tub and shower. Do not use towel bars as grab bars. Use non-skid mats or decals in the tub or shower. If you need to sit down in the shower, use a plastic, non-slip stool. Keep the floor dry. Clean up any water that spills on the floor as soon as it happens. Remove soap buildup in the tub or shower regularly. Attach bath mats securely with double-sided non-slip rug tape. Do not have throw rugs and other things on the floor that can make you trip. What can I do in the bedroom? Use night lights. Make sure that you have a light by your bed that is easy to reach. Do not use any sheets or blankets that are too big for your bed.  They should not hang down onto the floor. Have a firm chair that has side arms. You can use this for support while you get dressed. Do not have throw rugs and other things on the floor that can make you trip. What can I do in the kitchen? Clean up any spills right away. Avoid walking on wet floors. Keep items that you use a lot in easy-to-reach places. If you need to reach something above you, use a strong step stool that has a grab bar. Keep electrical cords out of the way. Do not use floor polish or wax that makes floors slippery. If you must use wax, use non-skid floor wax. Do not have throw rugs and other things on the floor that can make you trip. What can I do with my stairs? Do not leave any items on the stairs. Make sure that there are handrails on both sides of the stairs and use them. Fix handrails that are broken or loose. Make sure that handrails are as long as the stairways. Check any carpeting to make sure that it is firmly attached to the stairs. Fix any carpet that is loose or worn. Avoid having throw rugs at the top or bottom of the stairs. If you do have throw rugs, attach them to the floor with carpet tape. Make sure that you have a light switch at the top of the stairs and the bottom of the stairs. If you do not have them, ask someone to add them for you. What else can I do to help prevent falls? Wear shoes that: Do not have high heels. Have rubber bottoms. Are comfortable and fit you well. Are closed at the toe. Do not wear sandals. If you use a stepladder: Make sure that it is fully opened. Do not climb a closed stepladder. Make sure that both sides of the stepladder are locked into place. Ask someone to hold it for you, if possible. Clearly mark and make sure that you can see: Any grab bars or handrails. First and last steps. Where the edge of each step is. Use tools that help you move around (mobility aids) if they are needed. These  include: Canes.  Walkers. Scooters. Crutches. Turn on the lights when you go into a dark area. Replace any light bulbs as soon as they burn out. Set up your furniture so you have a clear path. Avoid moving your furniture around. If any of your floors are uneven, fix them. If there are any pets around you, be aware of where they are. Review your medicines with your doctor. Some medicines can make you feel dizzy. This can increase your chance of falling. Ask your doctor what other things that you can do to help prevent falls. This information is not intended to replace advice given to you by your health care provider. Make sure you discuss any questions you have with your health care provider. Document Released: 10/27/2008 Document Revised: 06/08/2015 Document Reviewed: 02/04/2014 Elsevier Interactive Patient Education  2017 [employer].

## 2021-11-19 DIAGNOSIS — E1142 Type 2 diabetes mellitus with diabetic polyneuropathy: Secondary | ICD-10-CM | POA: Diagnosis not present

## 2021-11-19 DIAGNOSIS — E1165 Type 2 diabetes mellitus with hyperglycemia: Secondary | ICD-10-CM | POA: Diagnosis not present

## 2021-11-20 DIAGNOSIS — M545 Low back pain, unspecified: Secondary | ICD-10-CM | POA: Diagnosis not present

## 2021-11-20 DIAGNOSIS — M5440 Lumbago with sciatica, unspecified side: Secondary | ICD-10-CM | POA: Diagnosis not present

## 2021-11-21 DIAGNOSIS — G5602 Carpal tunnel syndrome, left upper limb: Secondary | ICD-10-CM | POA: Diagnosis not present

## 2021-11-21 DIAGNOSIS — G5622 Lesion of ulnar nerve, left upper limb: Secondary | ICD-10-CM | POA: Diagnosis not present

## 2021-11-21 DIAGNOSIS — M25632 Stiffness of left wrist, not elsewhere classified: Secondary | ICD-10-CM | POA: Diagnosis not present

## 2021-11-21 DIAGNOSIS — R29898 Other symptoms and signs involving the musculoskeletal system: Secondary | ICD-10-CM | POA: Diagnosis not present

## 2021-11-21 DIAGNOSIS — M778 Other enthesopathies, not elsewhere classified: Secondary | ICD-10-CM | POA: Diagnosis not present

## 2021-11-21 DIAGNOSIS — T7840XS Allergy, unspecified, sequela: Secondary | ICD-10-CM | POA: Diagnosis not present

## 2021-11-27 DIAGNOSIS — M778 Other enthesopathies, not elsewhere classified: Secondary | ICD-10-CM | POA: Diagnosis not present

## 2021-11-27 DIAGNOSIS — G5622 Lesion of ulnar nerve, left upper limb: Secondary | ICD-10-CM | POA: Diagnosis not present

## 2021-11-27 DIAGNOSIS — M7702 Medial epicondylitis, left elbow: Secondary | ICD-10-CM | POA: Diagnosis not present

## 2021-11-27 DIAGNOSIS — G5601 Carpal tunnel syndrome, right upper limb: Secondary | ICD-10-CM | POA: Diagnosis not present

## 2021-12-07 DIAGNOSIS — M25632 Stiffness of left wrist, not elsewhere classified: Secondary | ICD-10-CM | POA: Diagnosis not present

## 2021-12-07 DIAGNOSIS — G5602 Carpal tunnel syndrome, left upper limb: Secondary | ICD-10-CM | POA: Diagnosis not present

## 2021-12-07 DIAGNOSIS — G5622 Lesion of ulnar nerve, left upper limb: Secondary | ICD-10-CM | POA: Diagnosis not present

## 2021-12-07 DIAGNOSIS — T7840XS Allergy, unspecified, sequela: Secondary | ICD-10-CM | POA: Diagnosis not present

## 2021-12-07 DIAGNOSIS — R29898 Other symptoms and signs involving the musculoskeletal system: Secondary | ICD-10-CM | POA: Diagnosis not present

## 2021-12-07 DIAGNOSIS — M778 Other enthesopathies, not elsewhere classified: Secondary | ICD-10-CM | POA: Diagnosis not present

## 2021-12-12 ENCOUNTER — Ambulatory Visit (HOSPITAL_COMMUNITY)
Admission: EM | Admit: 2021-12-12 | Discharge: 2021-12-12 | Disposition: A | Discharge status: Home / Self Care | Payer: Medicare HMO | Attending: Family Medicine | Admitting: Family Medicine

## 2021-12-12 ENCOUNTER — Ambulatory Visit (INDEPENDENT_AMBULATORY_CARE_PROVIDER_SITE_OTHER): Payer: Medicare HMO

## 2021-12-12 ENCOUNTER — Encounter (HOSPITAL_COMMUNITY): Payer: Self-pay

## 2021-12-12 ENCOUNTER — Telehealth: Payer: Self-pay | Admitting: Nurse Practitioner

## 2021-12-12 DIAGNOSIS — G8929 Other chronic pain: Secondary | ICD-10-CM | POA: Diagnosis not present

## 2021-12-12 DIAGNOSIS — M5440 Lumbago with sciatica, unspecified side: Secondary | ICD-10-CM

## 2021-12-12 DIAGNOSIS — M542 Cervicalgia: Secondary | ICD-10-CM | POA: Diagnosis not present

## 2021-12-12 MED ORDER — KETOROLAC TROMETHAMINE 30 MG/ML IJ SOLN
30.0000 mg | Freq: Once | INTRAMUSCULAR | Status: AC
  Administered 2021-12-12: 30 mg via INTRAMUSCULAR

## 2021-12-12 MED ORDER — KETOROLAC TROMETHAMINE 30 MG/ML IJ SOLN
INTRAMUSCULAR | Status: AC
  Filled 2021-12-12: qty 1

## 2021-12-12 MED ORDER — PREDNISONE 50 MG PO TABS: 50.0000 mg | ORAL_TABLET | Freq: Every day | ORAL | 0 refills | Status: AC

## 2021-12-12 MED ORDER — TIZANIDINE HCL 4 MG PO TABS: 4.0000 mg | ORAL_TABLET | Freq: Four times a day (QID) | ORAL | 0 refills | Status: DC | PRN

## 2021-12-12 MED ORDER — HYDROCODONE-ACETAMINOPHEN 5-325 MG PO TABS: 2.0000 | ORAL_TABLET | Freq: Four times a day (QID) | ORAL | 0 refills | Status: DC | PRN

## 2021-12-12 NOTE — Discharge Instructions (Addendum)
You were seen today for severe neck pain.  Your xray was normal today without obvious abnormality.  You pain is likely, in part, muscular in nature.  I have sent out several mediations for you today.  I have sent out a muscle relaxer (Please take at home as this may make you sleepy), a steroid pack, and a few days of pain medications.   I recommend continued use of heat and ice.  Please follow up with your primary care provider, or go to the ER for further testing if you having worsening pain, fever, nausea or vomiting.  You may get a neck collar as this may help with pain overall.

## 2021-12-12 NOTE — Telephone Encounter (Signed)
Pt called crying stating she is lying on her bed unable to move her neck or get off bed. Pt asking what should she do. Received pt approval to be transferred to triage at [facility]. Call transferred successfully.

## 2021-12-12 NOTE — ED Provider Notes (Signed)
[place]    CSN: [id number] Arrival date & time: 12/12/21  1320      History   Chief Complaint Chief Complaint  Patient presents with   Back Pain    HPI Autumn Valenzuela is a 61 y.o. female.   Patient is here for severe neck pain.  She has a h/o low back issues, s/p surgery x 2 of the lumbar spine earlier this year.  She has had continued low back pain for some time, working with her pcp and pain specialist on this.  At times had pain up her back into her neck and head.  Recently she has had more severe pain, using heat and ice in her neck area.  This morning she woke up in severe pain in her cervical spine, neck and head.  She is in tears today.  Taking tylenol only for pain.  She has limited rom of the neck due to pain.  When she sits she has pain that shoots up her back into her neck.  Last MRI of cervical spine was normal in 2020.  Has not had narcotic pain medication since march of this year.  She did call her pcp and was advised to go to the ER for her severe pain.  No fevers/chills.  No n/v.  No new numbness/tingling or weakness;  Some dizziness, but no vision changes.   Upon chart review she has been given several norco scripts over the year, the last was 8/23;       Past Medical History:  Diagnosis Date   Allergy    Anemia    Anxiety    Arthritis    Asthma    Back pain    Bipolar disorder ([place])    CVA (cerebral infarction)    Depression    Diabetes mellitus without complication (HCC)    High cholesterol    Hypertension    Left leg pain    Migraine    Sleep apnea    does not use a cpap   Stroke [facility]) 2007   no lasting weakness    Patient Active Problem List   Diagnosis Date Noted   Spondylosis without myelopathy or radiculopathy, lumbosacral region 06/26/2021   DDD (degenerative disc disease), lumbosacral 06/26/2021   Lumbar facet syndrome 05/14/2021   History of diabetic ketoacidosis 04/16/2021   Abnormal CT scan, lumbar spine  (10/11/2020) 04/16/2021   Chronic pain syndrome 02/28/2021   Pharmacologic therapy 02/28/2021   Disorder of skeletal system 02/28/2021   Problems influencing health status 02/28/2021   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   History of lumbar fusion 02/28/2021   Osteoarthritis of glenohumeral joint (Right) 02/28/2021   Osteoarthritis of AC (acromioclavicular) joint (Right) 02/28/2021   Lumbosacral facet arthropathy (Multilevel) 02/28/2021   History of lumbar laminectomy (L4-S1) 02/28/2021   Osteoarthritis of knee (Left) 02/28/2021   Severe obesity (BMI 35.0-39.9) with comorbidity ([place]) 02/28/2021   Marijuana use 02/28/2021   Chronic lower extremity pain (2ry area of Pain) (Bilateral) (L>R) 02/28/2021   Chronic hip pain (3ry area of Pain) (Bilateral) (L>R) 02/28/2021   Chronic groin pain (Left) 02/28/2021   Impaired range of motion of hip 02/28/2021   PAD (peripheral artery disease) ([place]) 02/12/2021   Peripheral arterial disease ([place]) 02/06/2021   Decreased dorsalis pedis pulse 01/19/2021   Diabetes mellitus ([place]) 01/19/2021   Bipolar disorder ([place]) 01/17/2021   Pseudoarthrosis of lumbar spine 12/01/2020   Chronic low back pain (1ry area of Pain) (Bilateral) (L>R) w/o sciatica  09/06/2020   Pain in thoracic spine 08/03/2020   Ulnar 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 of left upper extremity 08/09/2019   Pincer nail deformity 05/19/2019   Excessive daytime sleepiness 03/15/2019   Type 2 diabetes mellitus with hyperglycemia, with long-term current use of insulin ([place]) 03/08/2019   Type 2 diabetes mellitus with diabetic polyneuropathy, with long-term current use of insulin ([place]) 03/08/2019   Diabetic neuropathy with neurologic complication ([place]) 91/63/8466   Claudication of both lower extremities ([place]) 02/04/2019   RLS (restless legs syndrome) 02/04/2019   Sleep deprivation 02/04/2019   Hot flashes 02/04/2019   Neck pain 02/04/2019   Chronic migraine without aura, with intractable migraine, so  stated, with status migrainosus 01/27/2019   Chronic knee pain (4th area of Pain) (Bilateral) (L>R) 06/18/2018   Radiculopathy 02/11/2018   Diabetic ketoacidosis ([place]) 03/24/2016   Dermatitis 03/24/2016   HTN (hypertension) 03/24/2016   HLD (hyperlipidemia) 03/24/2016   DM (diabetes mellitus) ([place]) 03/24/2016   Chronic daily headache 11/23/2012   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    Past Surgical History:  Procedure Laterality Date   ABDOMINAL AORTOGRAM W/LOWER EXTREMITY N/A 02/08/2021   Procedure: ABDOMINAL AORTOGRAM W/LOWER EXTREMITY;  Surgeon: Marty Heck, MD;  Location: [facility];  Service: Cardiovascular;  Laterality: N/A;   ABDOMINAL HYSTERECTOMY     ANTERIOR LATERAL LUMBAR FUSION WITH PERCUTANEOUS SCREW 1 LEVEL Left 02/11/2018   Procedure: LEFT LATERAL LUMBAR  FOUR-FIVE INTERBODY FUSION WITH INSTRUMENTATION AND ALLOGRAFT;  Surgeon: Phylliss Bob, MD;  Location: [facility];  Service: Orthopedics;  Laterality: Left;  LEFT LATERAL LUMBAR  FOUR-FIVE INTERBODY FUSION WITH INSTRUMENTATION AND ALLOGRAFT   BACK SURGERY     Lumbar fusion L5-S1   DIABETES     ENDARTERECTOMY FEMORAL Left 02/12/2021   Procedure: LEFT COMMON FEMORAL ENDARTERECTOMY WITH BOVINE PATCH;  Surgeon: Marty Heck, MD;  Location: [place];  Service: Vascular;  Laterality: Left;   EYE SURGERY Bilateral    INGUINAL LYMPH NODE BIOPSY Left 07/25/2021   Procedure: EXCISIONAL BIOPSY LEFT INGUINAL LYMPH NODES;  Surgeon: Coralie Keens, MD;  Location: [facility];  Service: General;  Laterality: Left;   KNEE SURGERY     l 4-l5 status post lateral posterior fusion January 30,2020     NEUROFORAMINAL STENOSIS Left    Severe L4-L5, involving the level above previous fusion.   PARTIAL HYSTERECTOMY     RADICULOPATHY Left    L4, Secondary to an L4-L5 Spondylolisthesis   SHOULDER SURGERY      OB History     Gravida  3   Para  3   Term      Preterm      AB       Living  3      SAB      IAB      Ectopic      Multiple      Live Births               Home Medications    Prior to Admission medications   Medication Sig Start Date End Date Taking? Authorizing Provider  Accu-Chek FastClix Lancets MISC TEST 4 TIMES DAILY. DX E11.9 12/06/20   Nche, Charlene Brooke, NP  albuterol (PROVENTIL) (2.5 MG/3ML) 0.083% nebulizer solution Take 2.5 mg by nebulization every 6 (six) hours as needed for wheezing.    [provider]  albuterol (VENTOLIN HFA) 108 (90 Base) MCG/ACT inhaler Inhale 2 puffs into the lungs daily as needed for wheezing or  shortness of breath. 04/02/21   McElwee, Lauren A, NP  aspirin EC 81 MG EC tablet Take 1 tablet (81 mg total) by mouth daily at 6 (six) AM. Swallow whole. 02/14/21   Baglia, Corrina, PA-C  Blood Glucose Calibration (ACCU-CHEK GUIDE CONTROL) LIQD 1 each by In Vitro route every 30 (thirty) days. Needs Accu-check guide monitor system. DX. E11.9 Patient taking differently: 1 each by In Vitro route every 30 (thirty) days. Needs Accu-check guide monitor system. DX. E11.9 2 times daily 07/15/18   Elby Beck, FNP  blood glucose meter kit and supplies Dispense based on patient and insurance preference. Use up to four times daily as directed. (FOR ICD-10 E10.9, E11.9). 01/19/21   Shamleffer, Melanie Crazier, MD  buPROPion (WELLBUTRIN) 75 MG tablet Take 1 tablet (75 mg total) by mouth 2 (two) times daily. 05/30/21   Nche, Charlene Brooke, NP  Cholecalciferol (VITAMIN D3) 1.25 MG (50000 UT) CAPS Take 1 capsule by mouth once a week. 02/10/20   Tonia Ghent, MD  Continuous Blood Gluc Sensor (DEXCOM G6 SENSOR) MISC 1 Device by Does not apply route as directed. 05/18/21   Shamleffer, Melanie Crazier, MD  Continuous Blood Gluc Transmit (DEXCOM G6 TRANSMITTER) MISC 1 Device by Does not apply route as directed. 05/18/21   Shamleffer, Melanie Crazier, MD  diclofenac Sodium (VOLTAREN) 1 % GEL Apply 2 g topically 4 (four) times  daily. Patient taking differently: Apply 2 g topically 4 (four) times daily as needed (pain). 06/29/20   Nche, Charlene Brooke, NP  DULoxetine (CYMBALTA) 60 MG capsule Take 1 capsule (60 mg total) by mouth 2 (two) times daily. 06/27/21   Nche, Charlene Brooke, NP  empagliflozin (JARDIANCE) 25 MG TABS tablet Take 1 tablet (25 mg total) by mouth daily. 09/24/21   Shamleffer, Melanie Crazier, MD  ferrous sulfate 325 (65 FE) MG tablet Take 1 tablet (325 mg total) by mouth every other day. Patient taking differently: Take 325 mg by mouth daily. 02/08/19   Elby Beck, FNP  fluticasone (FLONASE) 50 MCG/ACT nasal spray Place 2 sprays into both nostrils daily as needed for allergies. 09/22/19   Elby Beck, FNP  insulin aspart (NOVOLOG FLEXPEN) 100 UNIT/ML FlexPen Max daily 30 units Patient taking differently: Inject 1-4 Units into the skin 3 (three) times daily as needed (blood sugar over 150). Max daily 30 units 09/15/20   Shamleffer, Melanie Crazier, MD  Insulin Glargine (BASAGLAR KWIKPEN) 100 UNIT/ML Inject 18 Units into the skin daily. 09/24/21   Shamleffer, Melanie Crazier, MD  Insulin Pen Needle 31G X 5 MM MISC 1 Units by Does not apply route in the morning, at noon, in the evening, and at bedtime. 09/24/21   Shamleffer, Melanie Crazier, MD  metFORMIN (GLUCOPHAGE-XR) 500 MG 24 hr tablet Take 1 tablet (500 mg total) by mouth in the morning and at bedtime. 09/24/21   Shamleffer, Melanie Crazier, MD  ONETOUCH VERIO test strip CHECK UP TO 4 TIMES A DAY AS DIRECTED 02/01/21   Shamleffer, Melanie Crazier, MD  pregabalin (LYRICA) 75 MG capsule Take 1 capsule (75 mg total) by mouth 2 (two) times daily. 10/18/21   Nche, Charlene Brooke, NP  rosuvastatin (CRESTOR) 20 MG tablet TAKE 1 TABLET(20 MG) BY MOUTH DAILY 06/27/21   Nche, Charlene Brooke, NP  Semaglutide,0.25 or 0.5MG/DOS, (OZEMPIC, 0.25 OR 0.5 MG/DOSE,) 2 MG/3ML SOPN Inject 0.5 mg into the skin once a week. 09/24/21   Shamleffer, Melanie Crazier, MD    Family  History Family History  Problem  Relation Age of Onset   Diabetes Mother    Stroke Father    Cancer Paternal Aunt    Cancer Paternal Aunt    Diabetes Brother    Colon cancer Neg Hx    Esophageal cancer Neg Hx    Rectal cancer Neg Hx    Stomach cancer Neg Hx     Social History Social History   Tobacco Use   Smoking status: Former    Types: Cigarettes    Quit date: 12/13/2015    Years since quitting: 6.0   Smokeless tobacco: Never  Vaping Use   Vaping Use: Never used  Substance Use Topics   Alcohol use: Not Currently    Comment: Occas   Drug use: Not Currently    Types: Marijuana     Allergies   Patient has no known allergies.   Review of Systems Review of Systems  Constitutional: Negative.   HENT: Negative.    Respiratory: Negative.    Cardiovascular: Negative.   Gastrointestinal: Negative.   Musculoskeletal:  Positive for back pain and neck pain.  Neurological:  Positive for dizziness and headaches. Negative for weakness.  Hematological: Negative.   Psychiatric/Behavioral: Negative.       Physical Exam Triage Vital Signs ED Triage Vitals  Enc Vitals Group     BP 12/12/21 1423 (!) 142/109     Pulse Rate 12/12/21 1423 93     Resp 12/12/21 1423 16     Temp 12/12/21 1423 98.1 F (36.7 C)     Temp Source 12/12/21 1423 Oral     SpO2 12/12/21 1423 96 %     Weight --      Height --      Head Circumference --      Peak Flow --      Pain Score 12/12/21 1420 10     Pain Loc --      Pain Edu? --      Excl. in [place]? --    No data found.  Updated Vital Signs BP (!) 142/109 (BP Location: Left Arm)   Pulse 93   Temp 98.1 F (36.7 C) (Oral)   Resp 16   SpO2 96%   Visual Acuity Right Eye Distance:   Left Eye Distance:   Bilateral Distance:    Right Eye Near:   Left Eye Near:    Bilateral Near:     Physical Exam Constitutional:      Appearance: Normal appearance.     Comments: Very tearful initially, but improved throughout the exam  HENT:      Head: Normocephalic and atraumatic.  Cardiovascular:     Rate and Rhythm: Normal rate and regular rhythm.  Pulmonary:     Effort: Pulmonary effort is normal.  Musculoskeletal:     Comments: + TTP to the cervical, thoracic and lumbar spine;   + TTP to the paraspinals from the neck to the low back.  More pain at the right paraspinal neck than at the spine or left;  Limited rom of the neck in all directions due to pain.    Neurological:     General: No focal deficit present.     Mental Status: She is alert.     Cranial Nerves: No cranial nerve deficit.     Sensory: No sensory deficit.     Motor: No weakness.     Coordination: Coordination normal.     Gait: Gait normal.     Deep Tendon Reflexes: Reflexes normal.  Psychiatric:  Mood and Affect: Mood normal.      UC Treatments / Results  Labs (all labs ordered are listed, but only abnormal results are displayed) Labs Reviewed - No data to display  EKG   Radiology DG Cervical Spine Complete  Result Date: 12/12/2021 CLINICAL DATA:  Neck pain EXAM: CERVICAL SPINE - COMPLETE 4+ VIEW COMPARISON:  11/30/2018 FINDINGS: There is no evidence of cervical spine fracture or prevertebral soft tissue swelling. Straightening of the cervical lordosis without static listhesis. Intervertebral disc heights are preserved. Oblique views demonstrate widely patent bony foramina bilaterally. No other significant bone abnormalities are identified. IMPRESSION: 1. No acute findings or significant degenerative changes of the cervical spine. 2. Straightening of the cervical lordosis without static listhesis. Electronically Signed   By: Davina Poke D.O.   On: 12/12/2021 15:01    Procedures Procedures (including critical care time)  Medications Ordered in UC Medications  ketorolac (TORADOL) 30 MG/ML injection 30 mg (has no administration in time range)    Initial Impression / Assessment and Plan / UC Course  I have reviewed the triage vital  signs and the nursing notes.  Pertinent labs & imaging results that were available during my care of the patient were reviewed by me and considered in my medical decision making (see chart for details).    Final Clinical Impressions(s) / UC Diagnoses   Final diagnoses:  Chronic low back pain with sciatica, sciatica laterality unspecified, unspecified back pain laterality  Neck pain     Discharge Instructions      You were seen today for severe neck pain.  Your xray was normal today without obvious abnormality.  You pain is likely, in part, muscular in nature.  I have sent out several mediations for you today.  I have sent out a muscle relaxer (Please take at home as this may make you sleepy), a steroid pack, and a few days of pain medications.   I recommend continued use of heat and ice.  Please follow up with your primary care provider, or go to the ER for further testing if you having worsening pain, fever, nausea or vomiting.  You may get a neck collar as this may help with pain overall.      ED Prescriptions     Medication Sig Dispense Auth. Provider   HYDROcodone-acetaminophen (NORCO/VICODIN) 5-325 MG tablet Take 2 tablets by mouth every 6 (six) hours as needed. 10 tablet Yoshie Kosel, MD   tiZANidine (ZANAFLEX) 4 MG tablet Take 1 tablet (4 mg total) by mouth every 6 (six) hours as needed for muscle spasms. 30 tablet Bartlomiej Jenkinson, MD   predniSONE (DELTASONE) 50 MG tablet Take 1 tablet (50 mg total) by mouth daily for 5 days. 5 tablet Rondel Oh, MD      PDMP not reviewed this encounter.   Rondel Oh, MD 12/12/21 1510

## 2021-12-18 DIAGNOSIS — Z6837 Body mass index (BMI) 37.0-37.9, adult: Secondary | ICD-10-CM | POA: Diagnosis not present

## 2021-12-18 DIAGNOSIS — M533 Sacrococcygeal disorders, not elsewhere classified: Secondary | ICD-10-CM | POA: Diagnosis not present

## 2021-12-18 DIAGNOSIS — M5412 Radiculopathy, cervical region: Secondary | ICD-10-CM | POA: Diagnosis not present

## 2021-12-19 DIAGNOSIS — E1165 Type 2 diabetes mellitus with hyperglycemia: Secondary | ICD-10-CM | POA: Diagnosis not present

## 2021-12-19 DIAGNOSIS — E1142 Type 2 diabetes mellitus with diabetic polyneuropathy: Secondary | ICD-10-CM | POA: Diagnosis not present

## 2021-12-20 DIAGNOSIS — R29898 Other symptoms and signs involving the musculoskeletal system: Secondary | ICD-10-CM | POA: Diagnosis not present

## 2021-12-20 DIAGNOSIS — T7840XS Allergy, unspecified, sequela: Secondary | ICD-10-CM | POA: Diagnosis not present

## 2021-12-20 DIAGNOSIS — M778 Other enthesopathies, not elsewhere classified: Secondary | ICD-10-CM | POA: Diagnosis not present

## 2021-12-20 DIAGNOSIS — G5622 Lesion of ulnar nerve, left upper limb: Secondary | ICD-10-CM | POA: Diagnosis not present

## 2021-12-20 DIAGNOSIS — M25632 Stiffness of left wrist, not elsewhere classified: Secondary | ICD-10-CM | POA: Diagnosis not present

## 2021-12-20 DIAGNOSIS — G5602 Carpal tunnel syndrome, left upper limb: Secondary | ICD-10-CM | POA: Diagnosis not present

## 2021-12-27 DIAGNOSIS — R29898 Other symptoms and signs involving the musculoskeletal system: Secondary | ICD-10-CM | POA: Diagnosis not present

## 2021-12-27 DIAGNOSIS — G5622 Lesion of ulnar nerve, left upper limb: Secondary | ICD-10-CM | POA: Diagnosis not present

## 2021-12-27 DIAGNOSIS — M25632 Stiffness of left wrist, not elsewhere classified: Secondary | ICD-10-CM | POA: Diagnosis not present

## 2021-12-27 DIAGNOSIS — G5602 Carpal tunnel syndrome, left upper limb: Secondary | ICD-10-CM | POA: Diagnosis not present

## 2021-12-27 DIAGNOSIS — M778 Other enthesopathies, not elsewhere classified: Secondary | ICD-10-CM | POA: Diagnosis not present

## 2021-12-27 DIAGNOSIS — T7840XS Allergy, unspecified, sequela: Secondary | ICD-10-CM | POA: Diagnosis not present

## 2021-12-31 DIAGNOSIS — M533 Sacrococcygeal disorders, not elsewhere classified: Secondary | ICD-10-CM | POA: Diagnosis not present

## 2022-01-01 ENCOUNTER — Encounter: Payer: Self-pay | Admitting: Physical Therapy

## 2022-01-01 ENCOUNTER — Ambulatory Visit: Payer: Medicare HMO | Attending: Orthopaedic Surgery | Admitting: Physical Therapy

## 2022-01-01 ENCOUNTER — Other Ambulatory Visit: Payer: Self-pay

## 2022-01-01 DIAGNOSIS — M6281 Muscle weakness (generalized): Secondary | ICD-10-CM | POA: Diagnosis not present

## 2022-01-01 DIAGNOSIS — M542 Cervicalgia: Secondary | ICD-10-CM | POA: Diagnosis not present

## 2022-01-01 NOTE — Patient Instructions (Signed)
      Use your hands to push back on your chin to move your whole head backward. Hold for about 5 seconds Do 10-15 repetitions       Tilt your head to the side, then gently pull your head to your shoulder Hold for 15-20 seconds Perform 3 times on each side

## 2022-01-01 NOTE — Therapy (Signed)
OUTPATIENT PHYSICAL THERAPY EVALUATION   Patient Name: Autumn Valenzuela MRN: [id number] DOB:10-31-1960, 61 y.o., female Today's Date: 01/01/2022  END OF SESSION:  PT End of Session - 01/01/22 0756     Visit Number 1    Number of Visits 9    Date for PT Re-Evaluation 02/26/22    Authorization Type [employer] MEDICARE    Progress Note Due on Visit 10    PT Start Time 0800    PT Stop Time 0845    PT Time Calculation (min) 45 min    Activity Tolerance Patient limited by pain    Behavior During Therapy [facility] for tasks assessed/performed             Past Medical History:  Diagnosis Date   Allergy    Anemia    Anxiety    Arthritis    Asthma    Back pain    Bipolar disorder ([place])    CVA (cerebral infarction)    Depression    Diabetes mellitus without complication (HCC)    High cholesterol    Hypertension    Left leg pain    Migraine    Sleep apnea    does not use a cpap   Stroke ([place]) 2007   no lasting weakness   Past Surgical History:  Procedure Laterality Date   ABDOMINAL AORTOGRAM W/LOWER EXTREMITY N/A 02/08/2021   Procedure: ABDOMINAL AORTOGRAM W/LOWER EXTREMITY;  Surgeon: Marty Heck, MD;  Location: [place] [facility];  Service: Cardiovascular;  Laterality: N/A;   ABDOMINAL HYSTERECTOMY     ANTERIOR LATERAL LUMBAR FUSION WITH PERCUTANEOUS SCREW 1 LEVEL Left 02/11/2018   Procedure: LEFT LATERAL LUMBAR  FOUR-FIVE INTERBODY FUSION WITH INSTRUMENTATION AND ALLOGRAFT;  Surgeon: Phylliss Bob, MD;  Location: [place];  Service: Orthopedics;  Laterality: Left;  LEFT LATERAL LUMBAR  FOUR-FIVE INTERBODY FUSION WITH INSTRUMENTATION AND ALLOGRAFT   BACK SURGERY     Lumbar fusion L5-S1   DIABETES     ENDARTERECTOMY FEMORAL Left 02/12/2021   Procedure: LEFT COMMON FEMORAL ENDARTERECTOMY WITH BOVINE PATCH;  Surgeon: Marty Heck, MD;  Location: [place];  Service: Vascular;  Laterality: Left;   EYE SURGERY Bilateral    INGUINAL LYMPH NODE BIOPSY Left 07/25/2021    Procedure: EXCISIONAL BIOPSY LEFT INGUINAL LYMPH NODES;  Surgeon: Coralie Keens, MD;  Location: [place];  Service: General;  Laterality: Left;   KNEE SURGERY     l 4-l5 status post lateral posterior fusion January 30,2020     NEUROFORAMINAL STENOSIS Left    Severe L4-L5, involving the level above previous fusion.   PARTIAL HYSTERECTOMY     RADICULOPATHY Left    L4, Secondary to an L4-L5 Spondylolisthesis   SHOULDER SURGERY     Patient Active Problem List   Diagnosis Date Noted   Spondylosis without myelopathy or radiculopathy, lumbosacral region 06/26/2021   DDD (degenerative disc disease), lumbosacral 06/26/2021   Lumbar facet syndrome 05/14/2021   History of diabetic ketoacidosis 04/16/2021   Abnormal CT scan, lumbar spine (10/11/2020) 04/16/2021   Chronic pain syndrome 02/28/2021   Pharmacologic therapy 02/28/2021   Disorder of skeletal system 02/28/2021   Problems influencing health status 02/28/2021   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   History of lumbar fusion 02/28/2021   Osteoarthritis of glenohumeral joint (Right) 02/28/2021   Osteoarthritis of AC (acromioclavicular) joint (Right) 02/28/2021   Lumbosacral facet arthropathy (Multilevel) 02/28/2021   History of lumbar laminectomy (L4-S1) 02/28/2021   Osteoarthritis of knee (Left) 02/28/2021  Severe obesity (BMI 35.0-39.9) with comorbidity ([place]) 02/28/2021   Marijuana use 02/28/2021   Chronic lower extremity pain (2ry area of Pain) (Bilateral) (L>R) 02/28/2021   Chronic hip pain (3ry area of Pain) (Bilateral) (L>R) 02/28/2021   Chronic groin pain (Left) 02/28/2021   Impaired range of motion of hip 02/28/2021   PAD (peripheral artery disease) ([place]) 02/12/2021   Peripheral arterial disease ([place]) 02/06/2021   Decreased dorsalis pedis pulse 01/19/2021   Diabetes mellitus ([place]) 01/19/2021   Bipolar disorder ([place]) 01/17/2021   Pseudoarthrosis of lumbar spine 12/01/2020   Chronic low back pain (1ry  area of Pain) (Bilateral) (L>R) w/o sciatica 09/06/2020   Pain in thoracic spine 08/03/2020   Ulnar 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 of left upper extremity 08/09/2019   Pincer nail deformity 05/19/2019   Excessive daytime sleepiness 03/15/2019   Type 2 diabetes mellitus with hyperglycemia, with long-term current use of insulin ([place]) 03/08/2019   Type 2 diabetes mellitus with diabetic polyneuropathy, with long-term current use of insulin ([place]) 03/08/2019   Diabetic neuropathy with neurologic complication ([place]) 95/28/4132   Claudication of both lower extremities ([place]) 02/04/2019   RLS (restless legs syndrome) 02/04/2019   Sleep deprivation 02/04/2019   Hot flashes 02/04/2019   Neck pain 02/04/2019   Chronic migraine without aura, with intractable migraine, so stated, with status migrainosus 01/27/2019   Chronic knee pain (4th area of Pain) (Bilateral) (L>R) 06/18/2018   Radiculopathy 02/11/2018   Diabetic ketoacidosis ([place]) 03/24/2016   Dermatitis 03/24/2016   HTN (hypertension) 03/24/2016   HLD (hyperlipidemia) 03/24/2016   DM (diabetes mellitus) ([place]) 03/24/2016   Chronic daily headache 11/23/2012   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    PCP: Flossie Buffy, NP  REFERRING PROVIDER: Eleonore Chiquito, NP  REFERRING DIAG: Radiculopathy, cervical region  THERAPY DIAG:  Cervicalgia  Muscle weakness (generalized)  Rationale for Evaluation and Treatment: Rehabilitation  ONSET DATE: 12/12/2021   SUBJECTIVE:        SUBJECTIVE STATEMENT: Patient reports one day she was laying on her side and then she went to lay on her back and then she couldn't get up or lift her neck because of neck pain. She went to urgent care who gave her a neck brace, and she does still wear it sometime at night. She also notes that the neck brace can lead to her having migraines every day, which she has had in the past. She does have a history of neck and significant low back pain,  which she has had previous surgeries in her back and injections, and she had botox injections for her migraines. She does report an occasional tingling going down her left arm about 3 days/week, and rubbing her arm will help her feel better.    PERTINENT HISTORY:  See PMH above  PAIN:  Are you having pain? Yes:  NPRS scale: 9/10 Pain location: Neck Pain description: Stabbing, stiff, pressure Aggravating factors: Constant pain, turning head too fast while driving Relieving factors: Medication, hot water, ice pain, massage  PRECAUTIONS: None  WEIGHT BEARING RESTRICTIONS: No  FALLS:  Has patient fallen in last 6 months? No  PLOF: Independent  PATIENT GOALS: Pain relief   OBJECTIVE:  DIAGNOSTIC FINDINGS:  Cervical X-ray 12/12/2021 IMPRESSION: 1. No acute findings or significant degenerative changes of the cervical spine. 2. Straightening of the cervical lordosis without static listhesis.  PATIENT SURVEYS:  FOTO 38% functional status  COGNITION: Overall cognitive status: Within functional limits for tasks assessed  SENSATION: WFL  POSTURE:  Rounded shoulder and forward head posture  PALPATION: Tender to palpation bilateral upper trap, cervical paraspinals, suboccipitals   CERVICAL ROM:   Active ROM A/PROM (deg) eval  Flexion 30  Extension 20  Right lateral flexion 20  Left lateral flexion 25  Right rotation 45  Left rotation 55   UPPER EXTREMITY ROM:   UE ROM grossly WFL  UPPER EXTREMITY MMT:  MMT Right eval Left eval  Shoulder flexion 4 4  Shoulder extension 4 4  Shoulder abduction 4 4  Shoulder internal rotation 4 4  Shoulder external rotation 4 4  Middle trapezius    Lower trapezius    Elbow flexion 5 5  Elbow extension 5 5  Wrist flexion    Wrist extension    Wrist ulnar deviation    Wrist radial deviation    Wrist pronation    Wrist supination    Grip strength     (Blank rows = not tested)  CERVICAL SPECIAL TESTS:  Radicular testing  negative  FUNCTIONAL TESTS:  DNF endurance: 6 seconds   TODAY'S TREATMENT:      [facility] Adult PT Treatment:                                                DATE: 01/01/2022 Therapeutic Exercise: Seated chin tuck and standing chin tuck at wall Seated upper trap stretch 2 x 20 sec each Manual: Skilled palpation and monitoring of muscle tension while performing TPDN Suboccipital release with gentle manual traction Trigger Point Dry Needling Treatment: Pre-treatment instruction: Patient instructed on dry needling rationale, procedures, and possible side effects including pain during treatment (achy,cramping feeling), bruising, drop of blood, lightheadedness, nausea, sweating. Patient Consent Given: Yes Education handout provided: No Muscles treated: bilateral upper trap and suboccipital  Needle size and number: .30x75m x 5 Electrical stimulation performed: No Parameters: N/A Treatment response/outcome: Twitch response elicited, Palpable decrease in muscle tension, and patient reporting reduction in neck pain and severity of migraine Post-treatment instructions: Patient instructed to expect possible mild to moderate muscle soreness later today and/or tomorrow. Patient instructed in methods to reduce muscle soreness and to continue prescribed HEP. If patient was dry needled over the lung field, patient was instructed on signs and symptoms of pneumothorax and, however unlikely, to see immediate medical attention should they occur. Patient was also educated on signs and symptoms of infection and to seek medical attention should they occur. Patient verbalized understanding of these instructions and education.  PATIENT EDUCATION:  Education details: Exam findings, POC, HEP, TPDN Person educated: Patient Education method: Explanation, Demonstration, Tactile cues, Verbal cues, and Handouts Education comprehension: verbalized understanding, returned demonstration, verbal cues required, tactile cues  required, and needs further education  HOME EXERCISE PROGRAM: See therex above, unable to provide [email or web address] due to system error   ASSESSMENT: CLINICAL IMPRESSION: Patient is a 61y.o. female who was seen today for physical therapy evaluation and treatment for acute exacerbation of chronic neck pain and migraines. Currently her neck pain seems to be musculoskeletal in nature without any radicular findings, despite patient reporting occasional left arm tingling. She does exhibit limitations with cervical motion, DNF endurance, postural deviations, and gross shoulder strength. Performed TPDN this visit for bilateral upper traps and suboccipitals with good therapeutic benefit and patient reporting reduction in severity of migraine.   OBJECTIVE IMPAIRMENTS: decreased activity tolerance, decreased ROM, decreased  strength, increased muscle spasms, impaired flexibility, postural dysfunction, and pain.   ACTIVITY LIMITATIONS: carrying, lifting, sleeping, bed mobility, and hygiene/grooming  PARTICIPATION LIMITATIONS: meal prep, cleaning, driving, shopping, and community activity  PERSONAL FACTORS: Fitness, Past/current experiences, Time since onset of injury/illness/exacerbation, and 3+ comorbidities: see PMH above  are also affecting patient's functional outcome.   REHAB POTENTIAL: Good  CLINICAL DECISION MAKING: Stable/uncomplicated  EVALUATION COMPLEXITY: Low   GOALS: Goals reviewed with patient? Yes  SHORT TERM GOALS: Target date: 01/29/2022  Patient will be I with initial HEP in order to progress with therapy. Baseline: HEP provided at eval Goal status: INITIAL  2.  PT will review FOTO with patient by 3rd visit in order to understand expected progress and outcome with therapy. Baseline: FOTO assessed at eval Goal status: INITIAL  3.  Patient will report neck pain </= 6/10 with activity in order to reduce functional limitations Baseline: 9/10 pain Goal status: INITIAL  LONG  TERM GOALS: Target date: 02/26/2022  Patient will be I with final HEP to maintain progress from PT. Baseline: HEP provided at eval Goal status: INITIAL  2.  Patient will report >/= 56% status on FOTO to indicate improved functional ability. Baseline: 38% Goal status: INITIAL  3.  Patient will demonstrate cervical rotation >/= 60 deg bilaterally to improve driving ability Baseline: limitation with bilateral rotation (see above) Goal status: INITIAL  4.  Patient will demonstrate DNF endurance >/= 20 seconds and shoulder strength grossly 5/5 MMT to improve postural control and reduce pain with household tasks Baseline: DNF endurance 6 seconds, shoulder strength 4/5 MMT Goal status: INITIAL   PLAN: PT FREQUENCY: 1x/week  PT DURATION: 8 weeks  PLANNED INTERVENTIONS: Therapeutic exercises, Therapeutic activity, Neuromuscular re-education, Balance training, Gait training, Patient/Family education, Self Care, Joint mobilization, Joint manipulation, Aquatic Therapy, Dry Needling, Electrical stimulation, Spinal manipulation, Spinal mobilization, Cryotherapy, Moist heat, Taping, Manual therapy, and Re-evaluation  PLAN FOR NEXT SESSION: Review HEP and progress PRN, response to dry needling, manual/dry needling and stretching for cervical region, progress postural control and shoulder strength   Hilda Blades, PT, DPT, LAT, ATC 01/01/22  11:30 AM Phone: [phone] Fax: [phone]

## 2022-01-03 DIAGNOSIS — G5622 Lesion of ulnar nerve, left upper limb: Secondary | ICD-10-CM | POA: Diagnosis not present

## 2022-01-03 DIAGNOSIS — M778 Other enthesopathies, not elsewhere classified: Secondary | ICD-10-CM | POA: Diagnosis not present

## 2022-01-03 DIAGNOSIS — M25632 Stiffness of left wrist, not elsewhere classified: Secondary | ICD-10-CM | POA: Diagnosis not present

## 2022-01-03 DIAGNOSIS — T7840XS Allergy, unspecified, sequela: Secondary | ICD-10-CM | POA: Diagnosis not present

## 2022-01-03 DIAGNOSIS — R29898 Other symptoms and signs involving the musculoskeletal system: Secondary | ICD-10-CM | POA: Diagnosis not present

## 2022-01-03 DIAGNOSIS — G5602 Carpal tunnel syndrome, left upper limb: Secondary | ICD-10-CM | POA: Diagnosis not present

## 2022-01-08 DIAGNOSIS — M25522 Pain in left elbow: Secondary | ICD-10-CM | POA: Diagnosis not present

## 2022-01-08 DIAGNOSIS — M25632 Stiffness of left wrist, not elsewhere classified: Secondary | ICD-10-CM | POA: Diagnosis not present

## 2022-01-08 DIAGNOSIS — R29898 Other symptoms and signs involving the musculoskeletal system: Secondary | ICD-10-CM | POA: Diagnosis not present

## 2022-01-08 DIAGNOSIS — G5602 Carpal tunnel syndrome, left upper limb: Secondary | ICD-10-CM | POA: Diagnosis not present

## 2022-01-08 DIAGNOSIS — G5622 Lesion of ulnar nerve, left upper limb: Secondary | ICD-10-CM | POA: Diagnosis not present

## 2022-01-08 DIAGNOSIS — M778 Other enthesopathies, not elsewhere classified: Secondary | ICD-10-CM | POA: Diagnosis not present

## 2022-01-08 DIAGNOSIS — T7840XS Allergy, unspecified, sequela: Secondary | ICD-10-CM | POA: Diagnosis not present

## 2022-01-11 ENCOUNTER — Encounter: Payer: Medicare HMO | Admitting: Physical Therapy

## 2022-01-17 NOTE — Therapy (Signed)
OUTPATIENT PHYSICAL THERAPY TREATMENT NOTE   Patient Name: Autumn Valenzuela MRN: [id number] DOB:September 27, 1960, 62 y.o., female Today's Date: 01/18/2022  PCP: Flossie Buffy, NP   REFERRING PROVIDER: Eleonore Chiquito, NP   END OF SESSION:   PT End of Session - 01/18/22 0937     Visit Number 2    Number of Visits 9    Date for PT Re-Evaluation 02/26/22    Authorization Type [employer] MEDICARE    Progress Note Due on Visit 10    PT Start Time 0931    PT Stop Time 1015    PT Time Calculation (min) 44 min    Activity Tolerance Patient tolerated treatment well    Behavior During Therapy [facility] for tasks assessed/performed             Past Medical History:  Diagnosis Date   Allergy    Anemia    Anxiety    Arthritis    Asthma    Back pain    Bipolar disorder ([place])    CVA (cerebral infarction)    Depression    Diabetes mellitus without complication ([place])    High cholesterol    Hypertension    Left leg pain    Migraine    Sleep apnea    does not use a cpap   Stroke ([place]) 2007   no lasting weakness   Past Surgical History:  Procedure Laterality Date   ABDOMINAL AORTOGRAM W/LOWER EXTREMITY N/A 02/08/2021   Procedure: ABDOMINAL AORTOGRAM W/LOWER EXTREMITY;  Surgeon: Marty Heck, MD;  Location: [place] [facility];  Service: Cardiovascular;  Laterality: N/A;   ABDOMINAL HYSTERECTOMY     ANTERIOR LATERAL LUMBAR FUSION WITH PERCUTANEOUS SCREW 1 LEVEL Left 02/11/2018   Procedure: LEFT LATERAL LUMBAR  FOUR-FIVE INTERBODY FUSION WITH INSTRUMENTATION AND ALLOGRAFT;  Surgeon: Phylliss Bob, MD;  Location: [place];  Service: Orthopedics;  Laterality: Left;  LEFT LATERAL LUMBAR  FOUR-FIVE INTERBODY FUSION WITH INSTRUMENTATION AND ALLOGRAFT   BACK SURGERY     Lumbar fusion L5-S1   DIABETES     ENDARTERECTOMY FEMORAL Left 02/12/2021   Procedure: LEFT COMMON FEMORAL ENDARTERECTOMY WITH BOVINE PATCH;  Surgeon: Marty Heck, MD;  Location: [place];  Service: Vascular;   Laterality: Left;   EYE SURGERY Bilateral    INGUINAL LYMPH NODE BIOPSY Left 07/25/2021   Procedure: EXCISIONAL BIOPSY LEFT INGUINAL LYMPH NODES;  Surgeon: Coralie Keens, MD;  Location: [place];  Service: General;  Laterality: Left;   KNEE SURGERY     l 4-l5 status post lateral posterior fusion January 30,2020     NEUROFORAMINAL STENOSIS Left    Severe L4-L5, involving the level above previous fusion.   PARTIAL HYSTERECTOMY     RADICULOPATHY Left    L4, Secondary to an L4-L5 Spondylolisthesis   SHOULDER SURGERY     Patient Active Problem List   Diagnosis Date Noted   Spondylosis without myelopathy or radiculopathy, lumbosacral region 06/26/2021   DDD (degenerative disc disease), lumbosacral 06/26/2021   Lumbar facet syndrome 05/14/2021   History of diabetic ketoacidosis 04/16/2021   Abnormal CT scan, lumbar spine (10/11/2020) 04/16/2021   Chronic pain syndrome 02/28/2021   Pharmacologic therapy 02/28/2021   Disorder of skeletal system 02/28/2021   Problems influencing health status 02/28/2021   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   History of lumbar fusion 02/28/2021   Osteoarthritis of glenohumeral joint (Right) 02/28/2021   Osteoarthritis of AC (acromioclavicular) joint (Right) 02/28/2021   Lumbosacral facet arthropathy (  Multilevel) 02/28/2021   History of lumbar laminectomy (L4-S1) 02/28/2021   Osteoarthritis of knee (Left) 02/28/2021   Severe obesity (BMI 35.0-39.9) with comorbidity ([place]) 02/28/2021   Marijuana use 02/28/2021   Chronic lower extremity pain (2ry area of Pain) (Bilateral) (L>R) 02/28/2021   Chronic hip pain (3ry area of Pain) (Bilateral) (L>R) 02/28/2021   Chronic groin pain (Left) 02/28/2021   Impaired range of motion of hip 02/28/2021   PAD (peripheral artery disease) ([place]) 02/12/2021   Peripheral arterial disease ([place]) 02/06/2021   Decreased dorsalis pedis pulse 01/19/2021   Diabetes mellitus ([place]) 01/19/2021   Bipolar disorder  ([place]) 01/17/2021   Pseudoarthrosis of lumbar spine 12/01/2020   Chronic low back pain (1ry area of Pain) (Bilateral) (L>R) w/o sciatica 09/06/2020   Pain in thoracic spine 08/03/2020   Ulnar 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 of left upper extremity 08/09/2019   Pincer nail deformity 05/19/2019   Excessive daytime sleepiness 03/15/2019   Type 2 diabetes mellitus with hyperglycemia, with long-term current use of insulin ([place]) 03/08/2019   Type 2 diabetes mellitus with diabetic polyneuropathy, with long-term current use of insulin ([place]) 03/08/2019   Diabetic neuropathy with neurologic complication ([place]) 21/30/8657   Claudication of both lower extremities ([place]) 02/04/2019   RLS (restless legs syndrome) 02/04/2019   Sleep deprivation 02/04/2019   Hot flashes 02/04/2019   Neck pain 02/04/2019   Chronic migraine without aura, with intractable migraine, so stated, with status migrainosus 01/27/2019   Chronic knee pain (4th area of Pain) (Bilateral) (L>R) 06/18/2018   Radiculopathy 02/11/2018   Diabetic ketoacidosis ([place]) 03/24/2016   Dermatitis 03/24/2016   HTN (hypertension) 03/24/2016   HLD (hyperlipidemia) 03/24/2016   DM (diabetes mellitus) ([place]) 03/24/2016   Chronic daily headache 11/23/2012   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    REFERRING DIAG: Radiculopathy, cervical region   THERAPY DIAG:  Cervicalgia  Muscle weakness (generalized)  Rationale for Evaluation and Treatment Rehabilitation  PERTINENT HISTORY: See PMH above   PRECAUTIONS: None    SUBJECTIVE:                                                                                                                                                                                     SUBJECTIVE STATEMENT:  Patient reports she is feeling a little bit better. The dry needling helped last visit. She has been consistent with her exercises. She does have a headache currently.  PAIN:  Are you having pain? Yes:  NPRS  scale: 5/10 Pain location: Neck Pain description: Stabbing, stiff, pressure Aggravating factors: Constant pain, turning head too fast while driving Relieving factors: Medication, hot water, ice pain, massage   OBJECTIVE: (objective measures completed  at initial evaluation unless otherwise dated) PATIENT SURVEYS:  FOTO 38% functional status   PALPATION: Tender to palpation bilateral upper trap, cervical paraspinals, suboccipitals              CERVICAL ROM:    Active ROM A/PROM (deg) eval   01/18/2022  Flexion 30 40  Extension 20 45  Right lateral flexion 20   Left lateral flexion 25   Right rotation 45 70  Left rotation 55 65    UPPER EXTREMITY ROM:                       UE ROM grossly WFL   UPPER EXTREMITY MMT:   MMT Right eval Left eval  Shoulder flexion 4 4  Shoulder extension 4 4  Shoulder abduction 4 4  Shoulder internal rotation 4 4  Shoulder external rotation 4 4  Middle trapezius      Lower trapezius      Elbow flexion 5 5  Elbow extension 5 5  Wrist flexion      Wrist extension      Wrist ulnar deviation      Wrist radial deviation      Wrist pronation      Wrist supination      Grip strength       (Blank rows = not tested)   FUNCTIONAL TESTS:  DNF endurance: 6 seconds     TODAY'S TREATMENT:      [facility] Adult PT Treatment:                                                DATE: 01/17/2022 Therapeutic Exercise: UBE L1 x 4 min (2 fwd/bwd) while taking subjective Supine horizontal abduction with yellow 2 x 12 Sidelying thoracic rotation x 10 each Seated upper trap stretch 2 x 20 sec each Seated chin tuck 2 x 10 Seated double ER and scap retraction with yellow 2 x 12 Manual Therapy: Skilled palpation and monitoring of muscle tension while performing TPDN STM bilateral upper trap region Suboccipital release with gentle manual traction Trigger Point Dry Needling Treatment: Pre-treatment instruction: Patient instructed on dry needling rationale,  procedures, and possible side effects including pain during treatment (achy,cramping feeling), bruising, drop of blood, lightheadedness, nausea, sweating. Patient Consent Given: Yes Education handout provided: No Muscles treated: bilateral upper trap and suboccipital  Needle size and number: .30x27m x 5 Electrical stimulation performed: No Parameters: N/A Treatment response/outcome: Twitch response elicited, Palpable decrease in muscle tension, and patient reporting reduction in neck pain and headache went away Post-treatment instructions: Patient instructed to expect possible mild to moderate muscle soreness later today and/or tomorrow. Patient instructed in methods to reduce muscle soreness and to continue prescribed HEP. If patient was dry needled over the lung field, patient was instructed on signs and symptoms of pneumothorax and, however unlikely, to see immediate medical attention should they occur. Patient was also educated on signs and symptoms of infection and to seek medical attention should they occur. Patient verbalized understanding of these instructions and education.   [facility]  [facility]: 01/01/2022 Therapeutic Exercise: Seated chin tuck and standing chin tuck at wall Seated upper trap stretch 2 x 20 sec each Manual: Skilled palpation and monitoring of muscle tension while performing TPDN Suboccipital release with gentle manual traction Trigger Point Dry Needling Treatment: Pre-treatment instruction: Patient instructed on dry needling rationale, procedures, and possible side effects including pain during treatment (achy,cramping feeling), bruising, drop of blood, lightheadedness, nausea, sweating. Patient Consent Given: Yes Education handout provided: No Muscles treated: bilateral upper trap and suboccipital  Needle size and number: .30x58m x 5 Electrical stimulation performed: No Parameters: N/A Treatment  response/outcome: Twitch response elicited, Palpable decrease in muscle tension, and patient reporting reduction in neck pain and severity of migraine Post-treatment instructions: Patient instructed to expect possible mild to moderate muscle soreness later today and/or tomorrow. Patient instructed in methods to reduce muscle soreness and to continue prescribed HEP. If patient was dry needled over the lung field, patient was instructed on signs and symptoms of pneumothorax and, however unlikely, to see immediate medical attention should they occur. Patient was also educated on signs and symptoms of infection and to seek medical attention should they occur. Patient verbalized understanding of these instructions and education.   PATIENT EDUCATION:  Education details: HEP, TPDN Person educated: Patient Education method: Explanation, Demonstration, Tactile cues, Verbal cues, and Handouts Education comprehension: verbalized understanding, returned demonstration, verbal cues required, tactile cues required, and needs further education   HOME EXERCISE PROGRAM: Access Code: [email or web address]     ASSESSMENT: CLINICAL IMPRESSION: Patient tolerated therapy well with no adverse effects. She does demonstrate much improved cervical motion this visit. Continued with TPDN this visit and patient continues to report improvement in her symptoms of neck and pain and tension, and headache following treatment. Therapy did focus on progressing her cervical and thoracic mobility, and initiating postural strengthening exercises with good tolerance. She does require cueing for proper postural control and is able to perform correctly with cueing. Updated HEP for postural strengthening. Patient would benefit from continued skilled PT to progress her mobility and strength in order reduce pain and maximize functional ability.    OBJECTIVE IMPAIRMENTS: decreased activity tolerance, decreased ROM, decreased strength, increased muscle  spasms, impaired flexibility, postural dysfunction, and pain.    ACTIVITY LIMITATIONS: carrying, lifting, sleeping, bed mobility, and hygiene/grooming   PARTICIPATION LIMITATIONS: meal prep, cleaning, driving, shopping, and community activity   PERSONAL FACTORS: Fitness, Past/current experiences, Time since onset of injury/illness/exacerbation, and 3+ comorbidities: see PMH above  are also affecting patient's functional outcome.      GOALS: Goals reviewed with patient? Yes   SHORT TERM GOALS: Target date: 01/29/2022   Patient will be I with initial HEP in order to progress with therapy. Baseline: HEP provided at eval 01/18/2022: progressing  Goal status: ONGOING   2.  PT will review FOTO with patient by 3rd visit in order to understand expected progress and outcome with therapy. Baseline: FOTO assessed at eval 01/18/2022: reviewed Goal status: MET   3.  Patient will report neck pain </= 6/10 with activity in order to reduce functional limitations Baseline: 9/10 pain 01/18/2022: 5/10 Goal status: MET   LONG TERM GOALS: Target date: 02/26/2022   Patient will be I with final HEP to maintain progress from PT. Baseline: HEP provided at eval Goal status: INITIAL   2.  Patient will report >/= 56% status on FOTO to indicate improved functional ability. Baseline: 38% Goal status: INITIAL   3.  Patient will demonstrate cervical rotation >/= 60 deg bilaterally  to improve driving ability Baseline: limitation with bilateral rotation (see above) Goal status: INITIAL   4.  Patient will demonstrate DNF endurance >/= 20 seconds and shoulder strength grossly 5/5 MMT to improve postural control and reduce pain with household tasks Baseline: DNF endurance 6 seconds, shoulder strength 4/5 MMT Goal status: INITIAL     PLAN: PT FREQUENCY: 1x/week   PT DURATION: 8 weeks   PLANNED INTERVENTIONS: Therapeutic exercises, Therapeutic activity, Neuromuscular re-education, Balance training, Gait  training, Patient/Family education, Self Care, Joint mobilization, Joint manipulation, Aquatic Therapy, Dry Needling, Electrical stimulation, Spinal manipulation, Spinal mobilization, Cryotherapy, Moist heat, Taping, Manual therapy, and Re-evaluation   PLAN FOR NEXT SESSION: Review HEP and progress PRN, response to dry needling, manual/dry needling and stretching for cervical region, progress postural control and shoulder strength   Hilda Blades, PT, DPT, LAT, ATC 01/18/22  10:16 AM Phone: [phone] Fax: [phone]

## 2022-01-18 ENCOUNTER — Ambulatory Visit: Payer: Medicare HMO | Attending: Orthopaedic Surgery | Admitting: Physical Therapy

## 2022-01-18 ENCOUNTER — Other Ambulatory Visit: Payer: Self-pay

## 2022-01-18 ENCOUNTER — Encounter: Payer: Self-pay | Admitting: Physical Therapy

## 2022-01-18 DIAGNOSIS — M542 Cervicalgia: Secondary | ICD-10-CM | POA: Insufficient documentation

## 2022-01-18 DIAGNOSIS — M6281 Muscle weakness (generalized): Secondary | ICD-10-CM | POA: Diagnosis not present

## 2022-01-18 DIAGNOSIS — E1165 Type 2 diabetes mellitus with hyperglycemia: Secondary | ICD-10-CM | POA: Diagnosis not present

## 2022-01-18 DIAGNOSIS — E1142 Type 2 diabetes mellitus with diabetic polyneuropathy: Secondary | ICD-10-CM | POA: Diagnosis not present

## 2022-01-18 NOTE — Patient Instructions (Signed)
Access Code: [facility] URL: [email or web address] Date: 01/18/2022 Prepared by: Hilda Blades  Exercises - Seated Neck Retraction  - 2 x daily - 10 reps - 5 seconds hold - Cervical Retraction at Wall  - 2 x daily - 10 reps - 5 seconds hold - Seated Cervical Sidebending Stretch  - 2 x daily - 3 reps - 15 seconds hold - Shoulder External Rotation and Scapular Retraction with Resistance  - 2 x daily - 2 sets - 12 reps

## 2022-01-23 NOTE — Therapy (Signed)
OUTPATIENT PHYSICAL THERAPY TREATMENT NOTE   Patient Name: Autumn Valenzuela MRN: [id number] DOB:[birth year], 62 y.o., female Today's Date: 01/25/2022  PCP: Flossie Buffy, NP   REFERRING PROVIDER: Eleonore Chiquito, NP   END OF SESSION:   PT End of Session - 01/25/22 1001     Visit Number 3    Number of Visits 9    Date for PT Re-Evaluation 02/26/22    Authorization Type [employer] MEDICARE    Progress Note Due on Visit 10    PT Start Time 0931    PT Stop Time 1015    PT Time Calculation (min) 44 min    Activity Tolerance Patient tolerated treatment well    Behavior During Therapy [facility] for tasks assessed/performed              Past Medical History:  Diagnosis Date   Allergy    Anemia    Anxiety    Arthritis    Asthma    Back pain    Bipolar disorder ([place])    CVA (cerebral infarction)    Depression    Diabetes mellitus without complication ([place])    High cholesterol    Hypertension    Left leg pain    Migraine    Sleep apnea    does not use a cpap   Stroke ([place]) 2007   no lasting weakness   Past Surgical History:  Procedure Laterality Date   ABDOMINAL AORTOGRAM W/LOWER EXTREMITY N/A 02/08/2021   Procedure: ABDOMINAL AORTOGRAM W/LOWER EXTREMITY;  Surgeon: Marty Heck, MD;  Location: [facility];  Service: Cardiovascular;  Laterality: N/A;   ABDOMINAL HYSTERECTOMY     ANTERIOR LATERAL LUMBAR FUSION WITH PERCUTANEOUS SCREW 1 LEVEL Left 02/11/2018   Procedure: LEFT LATERAL LUMBAR  FOUR-FIVE INTERBODY FUSION WITH INSTRUMENTATION AND ALLOGRAFT;  Surgeon: Phylliss Bob, MD;  Location: [facility];  Service: Orthopedics;  Laterality: Left;  LEFT LATERAL LUMBAR  FOUR-FIVE INTERBODY FUSION WITH INSTRUMENTATION AND ALLOGRAFT   BACK SURGERY     Lumbar fusion L5-S1   DIABETES     ENDARTERECTOMY FEMORAL Left 02/12/2021   Procedure: LEFT COMMON FEMORAL ENDARTERECTOMY WITH BOVINE PATCH;  Surgeon: Marty Heck, MD;  Location: [facility];  Service: Vascular;   Laterality: Left;   EYE SURGERY Bilateral    INGUINAL LYMPH NODE BIOPSY Left 07/25/2021   Procedure: EXCISIONAL BIOPSY LEFT INGUINAL LYMPH NODES;  Surgeon: Coralie Keens, MD;  Location: [facility];  Service: General;  Laterality: Left;   KNEE SURGERY     l 4-l5 status post lateral posterior fusion January 30,2020     NEUROFORAMINAL STENOSIS Left    Severe L4-L5, involving the level above previous fusion.   PARTIAL HYSTERECTOMY     RADICULOPATHY Left    L4, Secondary to an L4-L5 Spondylolisthesis   SHOULDER SURGERY     Patient Active Problem List   Diagnosis Date Noted   Spondylosis without myelopathy or radiculopathy, lumbosacral region 06/26/2021   DDD (degenerative disc disease), lumbosacral 06/26/2021   Lumbar facet syndrome 05/14/2021   History of diabetic ketoacidosis 04/16/2021   Abnormal CT scan, lumbar spine (10/11/2020) 04/16/2021   Chronic pain syndrome 02/28/2021   Pharmacologic therapy 02/28/2021   Disorder of skeletal system 02/28/2021   Problems influencing health status 02/28/2021   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   History of lumbar fusion 02/28/2021   Osteoarthritis of glenohumeral joint (Right) 02/28/2021   Osteoarthritis of AC (acromioclavicular) joint (Right) 02/28/2021   Lumbosacral facet  arthropathy (Multilevel) 02/28/2021   History of lumbar laminectomy (L4-S1) 02/28/2021   Osteoarthritis of knee (Left) 02/28/2021   Severe obesity (BMI 35.0-39.9) with comorbidity ([place]) 02/28/2021   Marijuana use 02/28/2021   Chronic lower extremity pain (2ry area of Pain) (Bilateral) (L>R) 02/28/2021   Chronic hip pain (3ry area of Pain) (Bilateral) (L>R) 02/28/2021   Chronic groin pain (Left) 02/28/2021   Impaired range of motion of hip 02/28/2021   PAD (peripheral artery disease) ([place]) 02/12/2021   Peripheral arterial disease ([place]) 02/06/2021   Decreased dorsalis pedis pulse 01/19/2021   Diabetes mellitus ([place]) 01/19/2021   Bipolar disorder  ([place]) 01/17/2021   Pseudoarthrosis of lumbar spine 12/01/2020   Chronic low back pain (1ry area of Pain) (Bilateral) (L>R) w/o sciatica 09/06/2020   Pain in thoracic spine 08/03/2020   Ulnar 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 of left upper extremity 08/09/2019   Pincer nail deformity 05/19/2019   Excessive daytime sleepiness 03/15/2019   Type 2 diabetes mellitus with hyperglycemia, with long-term current use of insulin ([place]) 03/08/2019   Type 2 diabetes mellitus with diabetic polyneuropathy, with long-term current use of insulin ([place]) 03/08/2019   Diabetic neuropathy with neurologic complication ([place]) 81/19/1478   Claudication of both lower extremities ([place]) 02/04/2019   RLS (restless legs syndrome) 02/04/2019   Sleep deprivation 02/04/2019   Hot flashes 02/04/2019   Neck pain 02/04/2019   Chronic migraine without aura, with intractable migraine, so stated, with status migrainosus 01/27/2019   Chronic knee pain (4th area of Pain) (Bilateral) (L>R) 06/18/2018   Radiculopathy 02/11/2018   Diabetic ketoacidosis ([place]) 03/24/2016   Dermatitis 03/24/2016   HTN (hypertension) 03/24/2016   HLD (hyperlipidemia) 03/24/2016   DM (diabetes mellitus) ([place]) 03/24/2016   Chronic daily headache 11/23/2012   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    REFERRING DIAG: Radiculopathy, cervical region   THERAPY DIAG:  Cervicalgia  Muscle weakness (generalized)  Rationale for Evaluation and Treatment Rehabilitation  PERTINENT HISTORY: See PMH above   PRECAUTIONS: None    SUBJECTIVE:                                                                                                                                                                                     SUBJECTIVE STATEMENT:  Patient reports the left side of her neck is aching more than the right today. She doesn't know if she hurt it when lifting up a case of water.   PAIN:  Are you having pain? Yes:  NPRS scale: 5/10 Pain  location: Neck Pain description: Stabbing, stiff, pressure Aggravating factors: Constant pain, turning head too fast while driving Relieving factors: Medication, hot water, ice pain, massage   OBJECTIVE: (  objective measures completed at initial evaluation unless otherwise dated) PATIENT SURVEYS:  FOTO 38% functional status   PALPATION: Tender to palpation bilateral upper trap, cervical paraspinals, suboccipitals              CERVICAL ROM:    Active ROM A/PROM (deg) eval   01/18/2022   01/25/2022  Flexion 30 40   Extension 20 45   Right lateral flexion 20    Left lateral flexion 25    Right rotation 45 70 70  Left rotation 55 65 65    UPPER EXTREMITY ROM:                       UE ROM grossly WFL   UPPER EXTREMITY MMT:   MMT Right eval Left eval  Shoulder flexion 4 4  Shoulder extension 4 4  Shoulder abduction 4 4  Shoulder internal rotation 4 4  Shoulder external rotation 4 4  Middle trapezius      Lower trapezius      Elbow flexion 5 5  Elbow extension 5 5  Wrist flexion      Wrist extension      Wrist ulnar deviation      Wrist radial deviation      Wrist pronation      Wrist supination      Grip strength       (Blank rows = not tested)   FUNCTIONAL TESTS:  DNF endurance: 6 seconds     TODAY'S TREATMENT:      [facility] Adult PT Treatment:                                                DATE: 01/25/2022 Therapeutic Exercise: UBE L1 x 4 min (2 fwd/bwd) while taking subjective Seated upper trap stretch 2 x 20 sec each Supine horizontal abduction with red 2 x 15 Sidelying thoracic rotation x 10 each Seated double ER and scap retraction with red 2 x 15 Seated chin tuck 2 x 10 Row with red  2 x 20 Extension with red 2 x 20 Manual Therapy: Skilled palpation and monitoring of muscle tension while performing TPDN STM bilateral upper trap region Suboccipital release with gentle manual traction Trigger Point Dry Needling Treatment: Pre-treatment instruction:  Patient instructed on dry needling rationale, procedures, and possible side effects including pain during treatment (achy,cramping feeling), bruising, drop of blood, lightheadedness, nausea, sweating. Patient Consent Given: Yes Education handout provided: No Muscles treated: left upper trap and splenius cervicis Needle size and number: .30x45m x 3 Electrical stimulation performed: No Parameters: N/A Treatment response/outcome: Twitch response elicited, Palpable decrease in muscle tension, and patient reporting reduction in neck pain and headache went away Post-treatment instructions: Patient instructed to expect possible mild to moderate muscle soreness later today and/or tomorrow. Patient instructed in methods to reduce muscle soreness and to continue prescribed HEP. If patient was dry needled over the lung field, patient was instructed on signs and symptoms of pneumothorax and, however unlikely, to see immediate medical attention should they occur. Patient was also educated on signs and symptoms of infection and to seek medical attention should they occur. Patient verbalized understanding of these instructions and education.   [facility]  [facility]: [facility] Therapeutic Exercise: UBE L1 x 4 min (2 fwd/bwd) while taking subjective Supine horizontal abduction with yellow 2 x 12 Sidelying thoracic rotation x 10 each Seated upper trap stretch 2 x 20 sec each Seated chin tuck 2 x 10 Seated double ER and scap retraction with yellow 2 x 12 Manual Therapy: Skilled palpation and monitoring of muscle tension while performing TPDN STM bilateral upper trap region Suboccipital release with gentle manual traction Trigger Point Dry Needling Treatment: Pre-treatment instruction: Patient instructed on dry needling rationale, procedures, and possible side effects including pain during treatment (achy,cramping feeling), bruising, drop of blood,  lightheadedness, nausea, sweating. Patient Consent Given: Yes Education handout provided: No Muscles treated: bilateral upper trap and suboccipital  Needle size and number: .30x45m x 5 Electrical stimulation performed: No Parameters: N/A Treatment response/outcome: Twitch response elicited, Palpable decrease in muscle tension, and patient reporting reduction in neck pain and headache went away Post-treatment instructions: Patient instructed to expect possible mild to moderate muscle soreness later today and/or tomorrow. Patient instructed in methods to reduce muscle soreness and to continue prescribed HEP. If patient was dry needled over the lung field, patient was instructed on signs and symptoms of pneumothorax and, however unlikely, to see immediate medical attention should they occur. Patient was also educated on signs and symptoms of infection and to seek medical attention should they occur. Patient verbalized understanding of these instructions and education.  [facility]                                                DATE: 01/[facility]/2022 Therapeutic Exercise: Seated chin tuck and standing chin tuck at wall Seated upper trap stretch 2 x 20 sec each Manual: Skilled palpation and monitoring of muscle tension while performing TPDN Suboccipital release with gentle manual traction Trigger Point Dry Needling Treatment: Pre-treatment instruction: Patient instructed on dry needling rationale, procedures, and possible side effects including pain during treatment (achy,cramping feeling), bruising, drop of blood, lightheadedness, nausea, sweating. Patient Consent Given: Yes Education handout provided: No Muscles treated: bilateral upper trap and suboccipital  Needle size and number: .30x5101mx 5 Electrical stimulation performed: No Parameters: N/A Treatment response/outcome: Twitch response elicited, Palpable decrease in muscle tension, and patient reporting reduction in neck pain and  severity of migraine Post-treatment instructions: Patient instructed to expect possible mild to moderate muscle soreness later today and/or tomorrow. Patient instructed in methods to reduce muscle soreness and to continue prescribed HEP. If patient was dry needled over the lung field, patient was instructed on signs and symptoms of pneumothorax and, however unlikely, to see immediate medical attention should they occur. Patient was also educated on signs and symptoms of infection and to seek medical attention should they occur. Patient verbalized understanding of these instructions and education.   PATIENT EDUCATION:  Education details: HEP update, TPDN Person educated: Patient Education method: Explanation, Demonstration, Tactile cues, Verbal cues, and Handouts Education comprehension: verbalized understanding, returned demonstration, verbal cues required, tactile cues required, and needs further education   HOME EXERCISE PROGRAM: Access Code: [email or web address]     ASSESSMENT: CLINICAL IMPRESSION: Patient tolerated therapy well with no adverse effects. Continued with dry needling for left sided neck pain this visit with good therapeutic benefit. Therapy incorporated more postural strengthening and endurance with good tolerance. She does require consistent cueing for proper technique to avoid shrug or poor  posture. She did report fatigue post session but no increased pain. Updated HEP to progress her strengthening and mobility for home. Patient would benefit from continued skilled PT to progress her mobility and strength in order reduce pain and maximize functional ability.    OBJECTIVE IMPAIRMENTS: decreased activity tolerance, decreased ROM, decreased strength, increased muscle spasms, impaired flexibility, postural dysfunction, and pain.    ACTIVITY LIMITATIONS: carrying, lifting, sleeping, bed mobility, and hygiene/grooming   PARTICIPATION LIMITATIONS: meal prep, cleaning, driving, shopping, and  community activity   PERSONAL FACTORS: Fitness, Past/current experiences, Time since onset of injury/illness/exacerbation, and 3+ comorbidities: see PMH above  are also affecting patient's functional outcome.      GOALS: Goals reviewed with patient? Yes   SHORT TERM GOALS: Target date: 01/29/2022   Patient will be I with initial HEP in order to progress with therapy. Baseline: HEP provided at eval 01/18/2022: progressing  Goal status: ONGOING   2.  PT will review FOTO with patient by 3rd visit in order to understand expected progress and outcome with therapy. Baseline: FOTO assessed at eval 01/18/2022: reviewed Goal status: MET   3.  Patient will report neck pain </= 6/10 with activity in order to reduce functional limitations Baseline: 9/10 pain 01/18/2022: 5/10 Goal status: MET   LONG TERM GOALS: Target date: 02/26/2022   Patient will be I with final HEP to maintain progress from PT. Baseline: HEP provided at eval Goal status: INITIAL   2.  Patient will report >/= 56% status on FOTO to indicate improved functional ability. Baseline: 38% Goal status: INITIAL   3.  Patient will demonstrate cervical rotation >/= 60 deg bilaterally to improve driving ability Baseline: limitation with bilateral rotation (see above) Goal status: INITIAL   4.  Patient will demonstrate DNF endurance >/= 20 seconds and shoulder strength grossly 5/5 MMT to improve postural control and reduce pain with household tasks Baseline: DNF endurance 6 seconds, shoulder strength 4/5 MMT Goal status: INITIAL     PLAN: PT FREQUENCY: 1x/week   PT DURATION: 8 weeks   PLANNED INTERVENTIONS: Therapeutic exercises, Therapeutic activity, Neuromuscular re-education, Balance training, Gait training, Patient/Family education, Self Care, Joint mobilization, Joint manipulation, Aquatic Therapy, Dry Needling, Electrical stimulation, Spinal manipulation, Spinal mobilization, Cryotherapy, Moist heat, Taping, Manual therapy,  and Re-evaluation   PLAN FOR NEXT SESSION: Review HEP and progress PRN, response to dry needling, manual/dry needling and stretching for cervical region, progress postural control and shoulder strength   Hilda Blades, PT, DPT, LAT, ATC 01/25/22  10:19 AM Phone: [phone] Fax: [phone]

## 2022-01-25 ENCOUNTER — Ambulatory Visit: Payer: Medicare HMO | Admitting: Physical Therapy

## 2022-01-25 ENCOUNTER — Encounter: Payer: Self-pay | Admitting: Physical Therapy

## 2022-01-25 ENCOUNTER — Other Ambulatory Visit: Payer: Self-pay

## 2022-01-25 DIAGNOSIS — M542 Cervicalgia: Secondary | ICD-10-CM

## 2022-01-25 DIAGNOSIS — M6281 Muscle weakness (generalized): Secondary | ICD-10-CM

## 2022-01-25 NOTE — Patient Instructions (Signed)
Access Code: [facility] URL: [email or web address] Date: 01/25/2022 Prepared by: Hilda Blades  Exercises - Supine Shoulder Horizontal Abduction with Resistance  - 2 x daily - 2 sets - 20 reps - Sidelying Thoracic Lumbar Rotation  - 2 x daily - 10 reps - Seated Neck Retraction  - 2 x daily - 10 reps - 5 seconds hold - Cervical Retraction at Wall  - 2 x daily - 10 reps - 5 seconds hold - Seated Cervical Sidebending Stretch  - 2 x daily - 3 reps - 15 seconds hold - Shoulder External Rotation and Scapular Retraction with Resistance  - 2 x daily - 2 sets - 12 reps

## 2022-01-29 DIAGNOSIS — Z6836 Body mass index (BMI) 36.0-36.9, adult: Secondary | ICD-10-CM | POA: Diagnosis not present

## 2022-01-29 DIAGNOSIS — M533 Sacrococcygeal disorders, not elsewhere classified: Secondary | ICD-10-CM | POA: Diagnosis not present

## 2022-01-30 DIAGNOSIS — G5622 Lesion of ulnar nerve, left upper limb: Secondary | ICD-10-CM | POA: Diagnosis not present

## 2022-01-30 DIAGNOSIS — M542 Cervicalgia: Secondary | ICD-10-CM | POA: Diagnosis not present

## 2022-01-30 DIAGNOSIS — R200 Anesthesia of skin: Secondary | ICD-10-CM | POA: Diagnosis not present

## 2022-01-31 ENCOUNTER — Telehealth: Payer: Self-pay

## 2022-01-31 NOTE — Progress Notes (Signed)
Care Management & Coordination Services Pharmacy Team  Reason for Encounter: Diabetes  Contacted patient on 01/31/2022 to discuss diabetes disease state.   Recent office visits:  11/14/2021 Jadene Pierini LPN (PCP) No medication Changes noted, Medicare Wellness completed   Recent consult visits:  01/31/2022 Dr. Dione Booze MD (Neurology) No Medication Changes noted 01/25/2022 Hilda Blades PT (Physical Therapy) No Medication Changes noted 01/18/2022 Hilda Blades PT (Physical Therapy) No Medication Changes noted 01/08/2022  Dr. Burney Gauze MD (Orthopedic Surgery) unable to see note 01/01/2022 Hilda Blades PT (Physical Therapy) No Medication Changes noted 12/07/2021  Occupational Medicine - Unable to see note 11/27/2021 Dr. Burney Gauze MD (Orthopedic Surgery)No Medication Changes noted 11/27/2021 Jacobo Forest (Orthopedic Surgery) Unable to see note 11/21/2021 Occupational Medicine - Unable to see note 11/20/2021 Hetal Patel- Unable to see note 11/19/2021 [facility] services- unable to see note 11/12/2021  Hessie Diener PTA (Physical Therapy) No Medication Changes noted 11/07/2021  Hilda Blades PT (Physical Therapy) No Medication Changes noted 10/31/2021 Occupational Medicine - Unable to see note 10/30/2021 Hessie Diener PTA (Physical Therapy) No Medication Changes noted 10/25/2021 Kary Kos (Neurosurgery) Unable to see note 10/24/2021 Occupational Medicine - Unable to see note 10/23/2021 Hilda Blades PT (Physical Therapy) No Medication Changes noted 10/19/2021 [facility] services- unable to see note  Hospital visits:  Medication Reconciliation was completed by comparing discharge summary, patient's EMR and Pharmacy list, and upon discussion with patient.  Admitted to the hospital on 12/12/2021 due to Neck Pain. Discharge date was 12/12/2021. Discharged from [facility] [place]?Medications Started at [facility] Discharge:?? -Started  hydrocodone-acetaminophen 5-325 MG every 6 hours PRN -Started tizanidine 4 MG tabletevery 6 hours PRN -Started prednisone 50 MG tablet daily for 5 days  Medication Changes at Hospital Discharge: -Changed None ID  Medications Discontinued at Hospital Discharge: -Stopped None ID  Medications that remain the same after Hospital Discharge:??  -All other medications will remain the same.    Medications: Outpatient Encounter Medications as of 01/31/2022  Medication Sig   Accu-Chek FastClix Lancets MISC TEST 4 TIMES DAILY. DX E11.9   albuterol (PROVENTIL) (2.5 MG/3ML) 0.083% nebulizer solution Take 2.5 mg by nebulization every 6 (six) hours as needed for wheezing.   albuterol (VENTOLIN HFA) 108 (90 Base) MCG/ACT inhaler Inhale 2 puffs into the lungs daily as needed for wheezing or shortness of breath.   aspirin EC 81 MG EC tablet Take 1 tablet (81 mg total) by mouth daily at 6 (six) AM. Swallow whole.   Blood Glucose Calibration (ACCU-CHEK GUIDE CONTROL) LIQD 1 each by In Vitro route every 30 (thirty) days. Needs Accu-check guide monitor system. DX. E11.9 (Patient taking differently: 1 each by In Vitro route every 30 (thirty) days. Needs Accu-check guide monitor system. DX. E11.9 2 times daily)   blood glucose meter kit and supplies Dispense based on patient and insurance preference. Use up to four times daily as directed. (FOR ICD-10 E10.9, E11.9).   buPROPion (WELLBUTRIN) 75 MG tablet Take 1 tablet (75 mg total) by mouth 2 (two) times daily.   Cholecalciferol (VITAMIN D3) 1.25 MG (50000 UT) CAPS Take 1 capsule by mouth once a week.   Continuous Blood Gluc Sensor (DEXCOM G6 SENSOR) MISC 1 Device by Does not apply route as directed.   Continuous Blood Gluc Transmit (DEXCOM G6 TRANSMITTER) MISC 1 Device by Does not apply route as directed.   diclofenac Sodium (VOLTAREN) 1 % GEL Apply 2 g topically 4 (four) times daily. (Patient taking differently:  Apply 2 g topically 4 (four) times daily as needed  (pain).)   DULoxetine (CYMBALTA) 60 MG capsule Take 1 capsule (60 mg total) by mouth 2 (two) times daily.   empagliflozin (JARDIANCE) 25 MG TABS tablet Take 1 tablet (25 mg total) by mouth daily.   ferrous sulfate 325 (65 FE) MG tablet Take 1 tablet (325 mg total) by mouth every other day. (Patient taking differently: Take 325 mg by mouth daily.)   fluticasone (FLONASE) 50 MCG/ACT nasal spray Place 2 sprays into both nostrils daily as needed for allergies.   HYDROcodone-acetaminophen (NORCO/VICODIN) 5-325 MG tablet Take 2 tablets by mouth every 6 (six) hours as needed.   insulin aspart (NOVOLOG FLEXPEN) 100 UNIT/ML FlexPen Max daily 30 units (Patient taking differently: Inject 1-4 Units into the skin 3 (three) times daily as needed (blood sugar over 150). Max daily 30 units)   Insulin Glargine (BASAGLAR KWIKPEN) 100 UNIT/ML Inject 18 Units into the skin daily.   Insulin Pen Needle 31G X 5 MM MISC 1 Units by Does not apply route in the morning, at noon, in the evening, and at bedtime.   metFORMIN (GLUCOPHAGE-XR) 500 MG 24 hr tablet Take 1 tablet (500 mg total) by mouth in the morning and at bedtime.   ONETOUCH VERIO test strip CHECK UP TO 4 TIMES A DAY AS DIRECTED   pregabalin (LYRICA) 75 MG capsule Take 1 capsule (75 mg total) by mouth 2 (two) times daily.   rosuvastatin (CRESTOR) 20 MG tablet TAKE 1 TABLET(20 MG) BY MOUTH DAILY   Semaglutide,0.25 or 0.'5MG'$ /DOS, (OZEMPIC, 0.25 OR 0.5 MG/DOSE,) 2 MG/3ML SOPN Inject 0.5 mg into the skin once a week.   tiZANidine (ZANAFLEX) 4 MG tablet Take 1 tablet (4 mg total) by mouth every 6 (six) hours as needed for muscle spasms.   No facility-administered encounter medications on file as of 01/31/2022.    Recent Relevant Labs: Lab Results  Component Value Date/Time   HGBA1C 5.8 (A) 09/24/2021 08:11 AM   HGBA1C 9.2 (A) 05/18/2021 08:11 AM   HGBA1C 8.7 (H) 02/12/2021 01:14 PM   HGBA1C 7.9 (H) 11/28/2020 09:30 AM   MICROALBUR 1.4 01/17/2021 08:36 AM    MICROALBUR <0.7 09/22/2019 01:27 PM    Kidney Function Lab Results  Component Value Date/Time   CREATININE 0.73 07/25/2021 10:50 AM   CREATININE 0.80 07/23/2021 12:22 PM   GFR 95.70 01/17/2021 08:36 AM   GFRNONAA >60 07/25/2021 10:50 AM   GFRAA >60 02/09/2018 08:10 AM    Current antihyperglycemic regimen:  Novolog 1-4 units three times daily with meals as needed Basaglar 18 units nightly Jardiance 25 mg daily Metformin XR 500 mg daily with breakfast Ozempic 0.5 mg weekly    Patient verbally confirms she is taking the above medications as directed. Yes  What diet changes have been made to improve diabetes control?  What recent interventions/DTPs have been made to improve glycemic control:  None ID  Have there been any recent hospitalizations or ED visits since last visit with PharmD? Yes  Patient denies hypoglycemic symptoms, including Pale, Sweaty, Shaky, Hungry, Nervous/irritable, and Vision changes  Patient denies hyperglycemic symptoms, including blurry vision, excessive thirst, fatigue, polyuria, and weakness  How often are you checking your blood sugar?  Patient reports she wears a Dexcom deceive.   What are your blood sugars ranging?  None ID  During the week, how often does your blood glucose drop below 70?  Patient states her blood sugar has drop down to 52 one time  last month,but none since.Patient states when that happen she ate something sweet and it went back up.Patient states her blood sugar really does not drop below 70.  Are you checking your feet daily/regularly? Yes, Patient states her feet have been painful on the top and bottom of her feet but more with the left foot then the right.Patient reports taking gabapentin, but it does not seem to help.Patient is asking if there something else she can do, or should she schedule appointment sooner with her Endocrinologist. Notified Clinical pharmacist.    Per Clinical Pharmacist, Looks like [facility] had recommended  stopping gabapentin and starting pregabalin 75 mg twice daily on 10/18/21. Did she try that? Otherwise she can see if her endocrinologist is available for a sooner follow-up if she would like.    Patient states she tried the pregabalin without any improvement.Patient states she will reach out to her endocrinologist to schedule a sooner appointment.   Adherence Review: Is the patient currently on a STATIN medication? Yes Is the patient currently on ACE/ARB medication? No Does the patient have >5 day gap between last estimated fill dates? No  Star Rating Drugs:  Medication:Rosuvastatin 20 mg Last Fill: 01/20/2022 Day Supply: 90 at [place]  Medication: Jardiance 25 mg Last Fill: 01/28/2022 Day Supply: 90 at [place] 500 mg Last Fill: 11/29/2022 Day Supply: 90 at [place]: Annual wellness visit in last year? Yes- Last completed 11/14/2021 Last eye exam / retinopathy screening:Last completed 08/02/2021 Last diabetic foot exam:last completed 05/30/2021 Influenza Vaccine COVID-19 Vaccine Dtap Vaccine Diabetic kidney evaluation - Urine ACR   Anderson [place] Clinical Pharmacist Assistant [phone]

## 2022-02-01 ENCOUNTER — Ambulatory Visit: Payer: Medicare HMO | Admitting: Physical Therapy

## 2022-02-01 DIAGNOSIS — M6281 Muscle weakness (generalized): Secondary | ICD-10-CM

## 2022-02-01 DIAGNOSIS — M542 Cervicalgia: Secondary | ICD-10-CM

## 2022-02-01 NOTE — Therapy (Signed)
OUTPATIENT PHYSICAL THERAPY TREATMENT NOTE   Patient Name: Autumn Valenzuela MRN: [id number] DOB:1960-02-10, 62 y.o., female Today's Date: 02/01/2022  PCP: Flossie Buffy, NP   REFERRING PROVIDER: Eleonore Chiquito, NP   END OF SESSION:   PT End of Session - 02/01/22 0935     Visit Number 4    Number of Visits 9    Date for PT Re-Evaluation 02/26/22    Authorization Type [employer] MEDICARE    PT Start Time 0932    PT Stop Time 1011    PT Time Calculation (min) 39 min              Past Medical History:  Diagnosis Date   Allergy    Anemia    Anxiety    Arthritis    Asthma    Back pain    Bipolar disorder ([place])    CVA (cerebral infarction)    Depression    Diabetes mellitus without complication (HCC)    High cholesterol    Hypertension    Left leg pain    Migraine    Sleep apnea    does not use a cpap   Stroke ([place]) 2007   no lasting weakness   Past Surgical History:  Procedure Laterality Date   ABDOMINAL AORTOGRAM W/LOWER EXTREMITY N/A 02/08/2021   Procedure: ABDOMINAL AORTOGRAM W/LOWER EXTREMITY;  Surgeon: Marty Heck, MD;  Location: [facility];  Service: Cardiovascular;  Laterality: N/A;   ABDOMINAL HYSTERECTOMY     ANTERIOR LATERAL LUMBAR FUSION WITH PERCUTANEOUS SCREW 1 LEVEL Left 02/11/2018   Procedure: LEFT LATERAL LUMBAR  FOUR-FIVE INTERBODY FUSION WITH INSTRUMENTATION AND ALLOGRAFT;  Surgeon: Phylliss Bob, MD;  Location: [facility];  Service: Orthopedics;  Laterality: Left;  LEFT LATERAL LUMBAR  FOUR-FIVE INTERBODY FUSION WITH INSTRUMENTATION AND ALLOGRAFT   BACK SURGERY     Lumbar fusion L5-S1   DIABETES     ENDARTERECTOMY FEMORAL Left 02/12/2021   Procedure: LEFT COMMON FEMORAL ENDARTERECTOMY WITH BOVINE PATCH;  Surgeon: Marty Heck, MD;  Location: [facility];  Service: Vascular;  Laterality: Left;   EYE SURGERY Bilateral    INGUINAL LYMPH NODE BIOPSY Left 07/25/2021   Procedure: EXCISIONAL BIOPSY LEFT INGUINAL LYMPH NODES;   Surgeon: Coralie Keens, MD;  Location: [facility];  Service: General;  Laterality: Left;   KNEE SURGERY     l 4-l5 status post lateral posterior fusion January 30,2020     NEUROFORAMINAL STENOSIS Left    Severe L4-L5, involving the level above previous fusion.   PARTIAL HYSTERECTOMY     RADICULOPATHY Left    L4, Secondary to an L4-L5 Spondylolisthesis   SHOULDER SURGERY     Patient Active Problem List   Diagnosis Date Noted   Spondylosis without myelopathy or radiculopathy, lumbosacral region 06/26/2021   DDD (degenerative disc disease), lumbosacral 06/26/2021   Lumbar facet syndrome 05/14/2021   History of diabetic ketoacidosis 04/16/2021   Abnormal CT scan, lumbar spine (10/11/2020) 04/16/2021   Chronic pain syndrome 02/28/2021   Pharmacologic therapy 02/28/2021   Disorder of skeletal system 02/28/2021   Problems influencing health status 02/28/2021   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   History of lumbar fusion 02/28/2021   Osteoarthritis of glenohumeral joint (Right) 02/28/2021   Osteoarthritis of AC (acromioclavicular) joint (Right) 02/28/2021   Lumbosacral facet arthropathy (Multilevel) 02/28/2021   History of lumbar laminectomy (L4-S1) 02/28/2021   Osteoarthritis of knee (Left) 02/28/2021   Severe obesity (BMI 35.0-39.9) with comorbidity ([place]) 02/28/2021  Marijuana use 02/28/2021   Chronic lower extremity pain (2ry area of Pain) (Bilateral) (L>R) 02/28/2021   Chronic hip pain (3ry area of Pain) (Bilateral) (L>R) 02/28/2021   Chronic groin pain (Left) 02/28/2021   Impaired range of motion of hip 02/28/2021   PAD (peripheral artery disease) ([place]) 02/12/2021   Peripheral arterial disease ([place]) 02/06/2021   Decreased dorsalis pedis pulse 01/19/2021   Diabetes mellitus ([place]) 01/19/2021   Bipolar disorder ([place]) 01/17/2021   Pseudoarthrosis of lumbar spine 12/01/2020   Chronic low back pain (1ry area of Pain) (Bilateral) (L>R) w/o sciatica 09/06/2020    Pain in thoracic spine 08/03/2020   Ulnar 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 of left upper extremity 08/09/2019   Pincer nail deformity 05/19/2019   Excessive daytime sleepiness 03/15/2019   Type 2 diabetes mellitus with hyperglycemia, with long-term current use of insulin ([place]) 03/08/2019   Type 2 diabetes mellitus with diabetic polyneuropathy, with long-term current use of insulin ([place]) 03/08/2019   Diabetic neuropathy with neurologic complication ([place]) 03/55/9741   Claudication of both lower extremities ([place]) 02/04/2019   RLS (restless legs syndrome) 02/04/2019   Sleep deprivation 02/04/2019   Hot flashes 02/04/2019   Neck pain 02/04/2019   Chronic migraine without aura, with intractable migraine, so stated, with status migrainosus 01/27/2019   Chronic knee pain (4th area of Pain) (Bilateral) (L>R) 06/18/2018   Radiculopathy 02/11/2018   Diabetic ketoacidosis ([place]) 03/24/2016   Dermatitis 03/24/2016   HTN (hypertension) 03/24/2016   HLD (hyperlipidemia) 03/24/2016   DM (diabetes mellitus) ([place]) 03/24/2016   Chronic daily headache 11/23/2012   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    REFERRING DIAG: Radiculopathy, cervical region   THERAPY DIAG:  Cervicalgia  Muscle weakness (generalized)  Rationale for Evaluation and Treatment Rehabilitation  PERTINENT HISTORY: See PMH above   PRECAUTIONS: None    SUBJECTIVE:                                                                                                                                                                                     SUBJECTIVE STATEMENT: I wake up with less headache since I've been to PT.  The neck is getting better too. But I have to play with the pillows every night to get comfortable.   PAIN:  Are you having pain? Yes:  NPRS scale: 6/10 Pain location: Neck Pain description: Stabbing, stiff, pressure Aggravating factors: Constant pain, turning head too fast while driving Relieving  factors: Medication, hot water, ice pain, massage   OBJECTIVE: (objective measures completed at initial evaluation unless otherwise dated) PATIENT SURVEYS:  FOTO 38% functional status   PALPATION: Tender to palpation bilateral upper trap, cervical paraspinals, suboccipitals  CERVICAL ROM:    Active ROM A/PROM (deg) eval   01/18/2022   01/25/2022  Flexion 30 40   Extension 20 45   Right lateral flexion 20    Left lateral flexion 25    Right rotation 45 70 70  Left rotation 55 65 65    UPPER EXTREMITY ROM:                       UE ROM grossly WFL   UPPER EXTREMITY MMT:   MMT Right eval Left eval  Shoulder flexion 4 4  Shoulder extension 4 4  Shoulder abduction 4 4  Shoulder internal rotation 4 4  Shoulder external rotation 4 4  Middle trapezius      Lower trapezius      Elbow flexion 5 5  Elbow extension 5 5  Wrist flexion      Wrist extension      Wrist ulnar deviation      Wrist radial deviation      Wrist pronation      Wrist supination      Grip strength       (Blank rows = not tested)   FUNCTIONAL TESTS:  DNF endurance: 6 seconds 02/01/22 DNF 10+ sec     TODAY'S TREATMENT:      [facility] Adult PT Treatment:                                                DATE: 02/01/22 Therapeutic Exercise: Standing row green x 20 Standing shoulder ext Green x 20 Standing shoulder red band ER x 20 Standing horizontal abduction 10 x 2 red band  Pec stretch in doorway 20 sec x 4 Standing open books Seated  upper trap  Seated levator stretch Seated chin tuck Supine chin tuck 10 x 2  Supine DNF 10 x 2 Seated OH press 5# 2 x 8 Supine red band alternating diagonals  Blue band horizontal abduction     [facility] Adult PT Treatment:                                                DATE: 01/25/2022 Therapeutic Exercise: UBE L1 x 4 min (2 fwd/bwd) while taking subjective Seated upper trap stretch 2 x 20 sec each Supine horizontal abduction with red 2 x 15 Sidelying  thoracic rotation x 10 each Seated double ER and scap retraction with red 2 x 15 Seated chin tuck 2 x 10 Row with red  2 x 20 Extension with red 2 x 20 Manual Therapy: Skilled palpation and monitoring of muscle tension while performing TPDN STM bilateral upper trap region Suboccipital release with gentle manual traction Trigger Point Dry Needling Treatment: Pre-treatment instruction: Patient instructed on dry needling rationale, procedures, and possible side effects including pain during treatment (achy,cramping feeling), bruising, drop of blood, lightheadedness, nausea, sweating. Patient Consent Given: Yes Education handout provided: No Muscles treated: left upper trap and splenius cervicis Needle size and number: .30x37m x 3 Electrical stimulation performed: No Parameters: N/A Treatment response/outcome: Twitch response elicited, Palpable decrease in muscle tension, and patient reporting reduction in neck pain and headache went away Post-treatment instructions: Patient instructed to expect possible mild to moderate muscle  soreness later today and/or tomorrow. Patient instructed in methods to reduce muscle soreness and to continue prescribed HEP. If patient was dry needled over the lung field, patient was instructed on signs and symptoms of pneumothorax and, however unlikely, to see immediate medical attention should they occur. Patient was also educated on signs and symptoms of infection and to seek medical attention should they occur. Patient verbalized understanding of these instructions and education.   [facility] Adult PT Treatment:                                                DATE: 01/17/2022 Therapeutic Exercise: UBE L1 x 4 min (2 fwd/bwd) while taking subjective Supine horizontal abduction with yellow 2 x 12 Sidelying thoracic rotation x 10 each Seated upper trap stretch 2 x 20 sec each Seated chin tuck 2 x 10 Seated double ER and scap retraction with yellow 2 x 12 Manual  Therapy: Skilled palpation and monitoring of muscle tension while performing TPDN STM bilateral upper trap region Suboccipital release with gentle manual traction Trigger Point Dry Needling Treatment: Pre-treatment instruction: Patient instructed on dry needling rationale, procedures, and possible side effects including pain during treatment (achy,cramping feeling), bruising, drop of blood, lightheadedness, nausea, sweating. Patient Consent Given: Yes Education handout provided: No Muscles treated: bilateral upper trap and suboccipital  Needle size and number: .30x56m x 5 Electrical stimulation performed: No Parameters: N/A Treatment response/outcome: Twitch response elicited, Palpable decrease in muscle tension, and patient reporting reduction in neck pain and headache went away Post-treatment instructions: Patient instructed to expect possible mild to moderate muscle soreness later today and/or tomorrow. Patient instructed in methods to reduce muscle soreness and to continue prescribed HEP. If patient was dry needled over the lung field, patient was instructed on signs and symptoms of pneumothorax and, however unlikely, to see immediate medical attention should they occur. Patient was also educated on signs and symptoms of infection and to seek medical attention should they occur. Patient verbalized understanding of these instructions and education.  [facility]                                                [facility]: [facility] Therapeutic Exercise: Seated chin tuck and standing chin tuck at wall Seated upper trap stretch 2 x 20 sec each Manual: Skilled palpation and monitoring of muscle tension while performing TPDN Suboccipital release with gentle manual traction Trigger Point Dry Needling Treatment: Pre-treatment instruction: Patient instructed on dry needling rationale, procedures, and possible side effects including pain during treatment (achy,cramping feeling), bruising,  drop of blood, lightheadedness, nausea, sweating. Patient Consent Given: Yes Education handout provided: No Muscles treated: bilateral upper trap and suboccipital  Needle size and number: .30x535mx 5 Electrical stimulation performed: No Parameters: N/A Treatment response/outcome: Twitch response elicited, Palpable decrease in muscle tension, and patient reporting reduction in neck pain and severity of migraine Post-treatment instructions: Patient instructed to expect possible mild to moderate muscle soreness later today and/or tomorrow. Patient instructed in methods to reduce muscle soreness and to continue prescribed HEP. If patient was dry needled over the lung field, patient was instructed on signs and symptoms of pneumothorax and, however unlikely, to see immediate medical attention should they occur.  Patient was also educated on signs and symptoms of infection and to seek medical attention should they occur. Patient verbalized understanding of these instructions and education.   PATIENT EDUCATION:  Education details: HEP update, TPDN Person educated: Patient Education method: Explanation, Demonstration, Tactile cues, Verbal cues, and Handouts Education comprehension: verbalized understanding, returned demonstration, verbal cues required, tactile cues required, and needs further education   HOME EXERCISE PROGRAM: Access Code: [facility] Access Code: [email or web address] URL: [email or web address] Date: 02/01/2022 Prepared by: Hessie Diener  Exercises - Supine Shoulder Horizontal Abduction with Resistance  - 2 x daily - 2 sets - 20 reps - Sidelying Thoracic Lumbar Rotation  - 2 x daily - 10 reps - Seated Neck Retraction  - 2 x daily - 10 reps - 5 seconds hold - Cervical Retraction at Wall  - 2 x daily - 10 reps - 5 seconds hold - Seated Cervical Sidebending Stretch  - 2 x daily - 3 reps - 15 seconds hold - Shoulder External Rotation and Scapular Retraction with Resistance  - 2 x  daily - 2 sets - 12 reps     ASSESSMENT: CLINICAL IMPRESSION: Patient tolerated therapy well with no adverse effects. She reports improvement in HA and neck pain. Pain in neck still rated at 6/10 and she has difficulty getting comfortable at night. Today we focused on DNF strength and shoulder strength. She tolerated all therex well but with fatigue in against gravity positions. Exercises modified appropriately for challenge. Her DNF strength has improved. Again, updated HEP to progress shoulder and DNF strength. She is interested in [employer] for UE strengthening. Patient would benefit from continued skilled PT to progress her mobility and strength in order reduce pain and maximize functional ability.    OBJECTIVE IMPAIRMENTS: decreased activity tolerance, decreased ROM, decreased strength, increased muscle spasms, impaired flexibility, postural dysfunction, and pain.    ACTIVITY LIMITATIONS: carrying, lifting, sleeping, bed mobility, and hygiene/grooming   PARTICIPATION LIMITATIONS: meal prep, cleaning, driving, shopping, and community activity   PERSONAL FACTORS: Fitness, Past/current experiences, Time since onset of injury/illness/exacerbation, and 3+ comorbidities: see PMH above  are also affecting patient's functional outcome.      GOALS: Goals reviewed with patient? Yes   SHORT TERM GOALS: Target date: 01/29/2022   Patient will be I with initial HEP in order to progress with therapy. Baseline: HEP provided at eval 01/18/2022: progressing  Goal status: ONGOING   2.  PT will review FOTO with patient by 3rd visit in order to understand expected progress and outcome with therapy. Baseline: FOTO assessed at eval 01/18/2022: reviewed Goal status: MET   3.  Patient will report neck pain </= 6/10 with activity in order to reduce functional limitations Baseline: 9/10 pain 01/18/2022: 5/10 Goal status: MET   LONG TERM GOALS: Target date: 02/26/2022   Patient will be I with final  HEP to maintain progress from PT. Baseline: HEP provided at eval Goal status: INITIAL   2.  Patient will report >/= 56% status on FOTO to indicate improved functional ability. Baseline: 38% Goal status: INITIAL   3.  Patient will demonstrate cervical rotation >/= 60 deg bilaterally to improve driving ability Baseline: limitation with bilateral rotation (see above) Goal status: INITIAL   4.  Patient will demonstrate DNF endurance >/= 20 seconds and shoulder strength grossly 5/5 MMT to improve postural control and reduce pain with household tasks Baseline: DNF endurance 6 seconds, shoulder strength 4/5 MMT Goal status: INITIAL     PLAN: PT FREQUENCY: 1x/week  PT DURATION: 8 weeks   PLANNED INTERVENTIONS: Therapeutic exercises, Therapeutic activity, Neuromuscular re-education, Balance training, Gait training, Patient/Family education, Self Care, Joint mobilization, Joint manipulation, Aquatic Therapy, Dry Needling, Electrical stimulation, Spinal manipulation, Spinal mobilization, Cryotherapy, Moist heat, Taping, Manual therapy, and Re-evaluation   PLAN FOR NEXT SESSION: Review HEP and progress PRN, response to dry needling, manual/dry needling and stretching for cervical region, progress postural control and shoulder strength   Hessie Diener, PTA 02/01/22 10:10 AM Phone: [phone] Fax: [phone]

## 2022-02-07 DIAGNOSIS — M25522 Pain in left elbow: Secondary | ICD-10-CM | POA: Diagnosis not present

## 2022-02-07 DIAGNOSIS — M778 Other enthesopathies, not elsewhere classified: Secondary | ICD-10-CM | POA: Diagnosis not present

## 2022-02-07 NOTE — Therapy (Signed)
OUTPATIENT PHYSICAL THERAPY TREATMENT NOTE  DISCHARGE   Patient Name: Autumn Valenzuela MRN: [id number] DOB:November 27, 1960, 62 y.o., female Today's Date: 02/08/2022  PCP: Flossie Buffy, NP   REFERRING PROVIDER: Eleonore Chiquito, NP   END OF SESSION:   PT End of Session - 02/08/22 0951     Visit Number 5    Number of Visits 9    Date for PT Re-Evaluation 02/26/22    Authorization Type [employer] MEDICARE    Progress Note Due on Visit 10    PT Start Time 0930    PT Stop Time 1015    PT Time Calculation (min) 45 min    Activity Tolerance Patient tolerated treatment well    Behavior During Therapy WFL for tasks assessed/performed               Past Medical History:  Diagnosis Date   Allergy    Anemia    Anxiety    Arthritis    Asthma    Back pain    Bipolar disorder ([place])    CVA (cerebral infarction)    Depression    Diabetes mellitus without complication (HCC)    High cholesterol    Hypertension    Left leg pain    Migraine    Sleep apnea    does not use a cpap   Stroke ([place]) 2007   no lasting weakness   Past Surgical History:  Procedure Laterality Date   ABDOMINAL AORTOGRAM W/LOWER EXTREMITY N/A 02/08/2021   Procedure: ABDOMINAL AORTOGRAM W/LOWER EXTREMITY;  Surgeon: Marty Heck, MD;  Location: [facility];  Service: Cardiovascular;  Laterality: N/A;   ABDOMINAL HYSTERECTOMY     ANTERIOR LATERAL LUMBAR FUSION WITH PERCUTANEOUS SCREW 1 LEVEL Left 02/11/2018   Procedure: LEFT LATERAL LUMBAR  FOUR-FIVE INTERBODY FUSION WITH INSTRUMENTATION AND ALLOGRAFT;  Surgeon: Phylliss Bob, MD;  Location: [facility];  Service: Orthopedics;  Laterality: Left;  LEFT LATERAL LUMBAR  FOUR-FIVE INTERBODY FUSION WITH INSTRUMENTATION AND ALLOGRAFT   BACK SURGERY     Lumbar fusion L5-S1   DIABETES     ENDARTERECTOMY FEMORAL Left 02/12/2021   Procedure: LEFT COMMON FEMORAL ENDARTERECTOMY WITH BOVINE PATCH;  Surgeon: Marty Heck, MD;  Location: [facility];   Service: Vascular;  Laterality: Left;   EYE SURGERY Bilateral    INGUINAL LYMPH NODE BIOPSY Left 07/25/2021   Procedure: EXCISIONAL BIOPSY LEFT INGUINAL LYMPH NODES;  Surgeon: Coralie Keens, MD;  Location: [facility];  Service: General;  Laterality: Left;   KNEE SURGERY     l 4-l5 status post lateral posterior fusion January 30,2020     NEUROFORAMINAL STENOSIS Left    Severe L4-L5, involving the level above previous fusion.   PARTIAL HYSTERECTOMY     RADICULOPATHY Left    L4, Secondary to an L4-L5 Spondylolisthesis   SHOULDER SURGERY     Patient Active Problem List   Diagnosis Date Noted   Spondylosis without myelopathy or radiculopathy, lumbosacral region 06/26/2021   DDD (degenerative disc disease), lumbosacral 06/26/2021   Lumbar facet syndrome 05/14/2021   History of diabetic ketoacidosis 04/16/2021   Abnormal CT scan, lumbar spine (10/11/2020) 04/16/2021   Chronic pain syndrome 02/28/2021   Pharmacologic therapy 02/28/2021   Disorder of skeletal system 02/28/2021   Problems influencing health status 02/28/2021   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   History of lumbar fusion 02/28/2021   Osteoarthritis of glenohumeral joint (Right) 02/28/2021   Osteoarthritis of AC (acromioclavicular) joint (Right) 02/28/2021  Lumbosacral facet arthropathy (Multilevel) 02/28/2021   History of lumbar laminectomy (L4-S1) 02/28/2021   Osteoarthritis of knee (Left) 02/28/2021   Severe obesity (BMI 35.0-39.9) with comorbidity ([place]) 02/28/2021   Marijuana use 02/28/2021   Chronic lower extremity pain (2ry area of Pain) (Bilateral) (L>R) 02/28/2021   Chronic hip pain (3ry area of Pain) (Bilateral) (L>R) 02/28/2021   Chronic groin pain (Left) 02/28/2021   Impaired range of motion of hip 02/28/2021   PAD (peripheral artery disease) ([place]) 02/12/2021   Peripheral arterial disease ([place]) 02/06/2021   Decreased dorsalis pedis pulse 01/19/2021   Diabetes mellitus ([place]) 01/19/2021    Bipolar disorder ([place]) 01/17/2021   Pseudoarthrosis of lumbar spine 12/01/2020   Chronic low back pain (1ry area of Pain) (Bilateral) (L>R) w/o sciatica 09/06/2020   Pain in thoracic spine 08/03/2020   Ulnar 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 of left upper extremity 08/09/2019   Pincer nail deformity 05/19/2019   Excessive daytime sleepiness 03/15/2019   Type 2 diabetes mellitus with hyperglycemia, with long-term current use of insulin ([place]) 03/08/2019   Type 2 diabetes mellitus with diabetic polyneuropathy, with long-term current use of insulin ([place]) 03/08/2019   Diabetic neuropathy with neurologic complication ([place]) 56/38/7564   Claudication of both lower extremities ([place]) 02/04/2019   RLS (restless legs syndrome) 02/04/2019   Sleep deprivation 02/04/2019   Hot flashes 02/04/2019   Neck pain 02/04/2019   Chronic migraine without aura, with intractable migraine, so stated, with status migrainosus 01/27/2019   Chronic knee pain (4th area of Pain) (Bilateral) (L>R) 06/18/2018   Radiculopathy 02/11/2018   Diabetic ketoacidosis ([place]) 03/24/2016   Dermatitis 03/24/2016   HTN (hypertension) 03/24/2016   HLD (hyperlipidemia) 03/24/2016   DM (diabetes mellitus) ([place]) 03/24/2016   Chronic daily headache 11/23/2012   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    REFERRING DIAG: Radiculopathy, cervical region   THERAPY DIAG:  Cervicalgia  Muscle weakness (generalized)  Rationale for Evaluation and Treatment Rehabilitation  PERTINENT HISTORY: See PMH above   PRECAUTIONS: None    SUBJECTIVE:                                                                                                                                                                                     SUBJECTIVE STATEMENT: Patient reports continued improvement in her neck pain. She feels like headaches are improving are too.   PAIN:  Are you having pain? Yes:  NPRS scale: 3/10 Pain location: Neck Pain  description: Tightness Aggravating factors: Constant pain, turning head too fast while driving Relieving factors: Medication, hot water, ice pain, massage   OBJECTIVE: (objective measures completed at initial evaluation unless otherwise dated) PATIENT SURVEYS:  FOTO 38%  functional status 02/08/22: 70%   PALPATION: Tender to palpation bilateral upper trap, cervical paraspinals, suboccipitals              CERVICAL ROM:    Active ROM A/PROM (deg) eval   01/18/2022   01/25/2022   02/08/2022  Flexion 30 40  45  Extension 20 45  45  Right lateral flexion 20     Left lateral flexion 25     Right rotation 45 70 70 70  Left rotation 55 65 65 70    UPPER EXTREMITY ROM:                       UE ROM grossly WFL   UPPER EXTREMITY MMT:   MMT Right eval Left eval Rt / Lt 02/08/22  Shoulder flexion '4 4 5 '$ / 5  Shoulder extension '4 4 5 '$ / 5  Shoulder abduction '4 4 5 '$ / 5  Shoulder internal rotation '4 4 5 '$ / 5  Shoulder external rotation '4 4 5 '$ / 5  Middle trapezius       Lower trapezius       Elbow flexion 5 5   Elbow extension 5 5   Wrist flexion       Wrist extension       Wrist ulnar deviation       Wrist radial deviation       Wrist pronation       Wrist supination       Grip strength        (Blank rows = not tested)   FUNCTIONAL TESTS:  DNF endurance: 6 seconds 02/01/22 DNF 10+ sec 02/08/2022: 24 seconds     TODAY'S TREATMENT:      [facility] Adult PT Treatment:                                                DATE: 02/08/2022 Therapeutic Exercise: UBE L1 x 4 min (2 fwd/bwd) while taking subjective Sidelying thoracic rotation x 10 each Supine chin tuck 2 x 10 Seated upper trap stretch 2 x 20 sec each Supine horizontal abduction with green 2 x 15 Standing double ER and scap retraction with green 2 x 10 Standing horizontal abduction with green 2 x 10 Standing alternating diagonals with green 2 x 10 Row with green  2 x 20 Extension with green 2 x 20 Manual Therapy: Skilled  palpation and monitoring of muscle tension while performing TPDN Suboccipital release with gentle manual traction Trigger Point Dry Needling Treatment: Pre-treatment instruction: Patient instructed on dry needling rationale, procedures, and possible side effects including pain during treatment (achy,cramping feeling), bruising, drop of blood, lightheadedness, nausea, sweating. Patient Consent Given: Yes Education handout provided: No Muscles treated: bilateral upper trap and suboccipitals Needle size and number: .30x48m x 6 Electrical stimulation performed: No Parameters: N/A Treatment response/outcome: Twitch response elicited, Palpable decrease in muscle tension, and patient reporting reduction in neck pain and headache went away Post-treatment instructions: Patient instructed to expect possible mild to moderate muscle soreness later today and/or tomorrow. Patient instructed in methods to reduce muscle soreness and to continue prescribed HEP. If patient was dry needled over the lung field, patient was instructed on signs and symptoms of pneumothorax and, however unlikely, to see immediate medical attention should they occur. Patient was also educated on signs and symptoms of  infection and to seek medical attention should they occur. Patient verbalized understanding of these instructions and education.   [facility] Adult PT Treatment:                                                DATE: 02/01/22 Therapeutic Exercise: Standing row green x 20 Standing shoulder ext Green x 20 Standing shoulder red band ER x 20 Standing horizontal abduction 10 x 2 red band  Pec stretch in doorway 20 sec x 4 Standing open books Seated  upper trap  Seated levator stretch Seated chin tuck Supine chin tuck 10 x 2  Supine DNF 10 x 2 Seated OH press 5# 2 x 8 Supine red band alternating diagonals  Blue band horizontal abduction   [facility] Adult PT Treatment:                                                DATE:  01/25/2022 Therapeutic Exercise: UBE L1 x 4 min (2 fwd/bwd) while taking subjective Seated upper trap stretch 2 x 20 sec each Supine horizontal abduction with red 2 x 15 Sidelying thoracic rotation x 10 each Seated double ER and scap retraction with red 2 x 15 Seated chin tuck 2 x 10 Row with red  2 x 20 Extension with red 2 x 20 Manual Therapy: Skilled palpation and monitoring of muscle tension while performing TPDN STM bilateral upper trap region Suboccipital release with gentle manual traction Trigger Point Dry Needling Treatment: Pre-treatment instruction: Patient instructed on dry needling rationale, procedures, and possible side effects including pain during treatment (achy,cramping feeling), bruising, drop of blood, lightheadedness, nausea, sweating. Patient Consent Given: Yes Education handout provided: No Muscles treated: left upper trap and splenius cervicis Needle size and number: .30x54m x 3 Electrical stimulation performed: No Parameters: N/A Treatment response/outcome: Twitch response elicited, Palpable decrease in muscle tension, and patient reporting reduction in neck pain and headache went away Post-treatment instructions: Patient instructed to expect possible mild to moderate muscle soreness later today and/or tomorrow. Patient instructed in methods to reduce muscle soreness and to continue prescribed HEP. If patient was dry needled over the lung field, patient was instructed on signs and symptoms of pneumothorax and, however unlikely, to see immediate medical attention should they occur. Patient was also educated on signs and symptoms of infection and to seek medical attention should they occur. Patient verbalized understanding of these instructions and education.   PATIENT EDUCATION:  Education details: POC discharge and follow-up when cleared following elbow surgery as able, HEP update, FOTO Person educated: Patient Education method: Explanation, Demonstration,  Tactile cues, Verbal cues, and Handouts Education comprehension: verbalized understanding, returned demonstration, verbal cues required, tactile cues required, and needs further education   HOME EXERCISE PROGRAM: Access Code: [email or web address]     ASSESSMENT: CLINICAL IMPRESSION: Patient tolerated therapy well with no adverse effects. She has progress well in therapy and demonstrates improved neck range of motion and postural strength and control. She reports great improvement in functional status on FOTO. Patient will be discharged from PT as she has met her goals and is scheduled for upcoming elbow surgery.     OBJECTIVE IMPAIRMENTS: decreased activity tolerance, decreased ROM, decreased strength, increased muscle spasms, impaired flexibility, postural  dysfunction, and pain.    ACTIVITY LIMITATIONS: carrying, lifting, sleeping, bed mobility, and hygiene/grooming   PARTICIPATION LIMITATIONS: meal prep, cleaning, driving, shopping, and community activity   PERSONAL FACTORS: Fitness, Past/current experiences, Time since onset of injury/illness/exacerbation, and 3+ comorbidities: see PMH above  are also affecting patient's functional outcome.      GOALS: Goals reviewed with patient? Yes   SHORT TERM GOALS: Target date: 01/29/2022   Patient will be I with initial HEP in order to progress with therapy. Baseline: HEP provided at eval 01/18/2022: progressing  02/08/2022: independent Goal status: MET   2.  PT will review FOTO with patient by 3rd visit in order to understand expected progress and outcome with therapy. Baseline: FOTO assessed at eval 01/18/2022: reviewed Goal status: MET   3.  Patient will report neck pain </= 6/10 with activity in order to reduce functional limitations Baseline: 9/10 pain 01/18/2022: 5/10 Goal status: MET   LONG TERM GOALS: Target date: 02/26/2022   Patient will be I with final HEP to maintain progress from PT. Baseline: HEP provided at eval 02/08/2022:  independent  Goal status: MET   2.  Patient will report >/= 56% status on FOTO to indicate improved functional ability. Baseline: 38% 02/08/2022: 70% Goal status: MET   3.  Patient will demonstrate cervical rotation >/= 60 deg bilaterally to improve driving ability Baseline: limitation with bilateral rotation (see above) 02/08/2022: see above Goal status: MET   4.  Patient will demonstrate DNF endurance >/= 20 seconds and shoulder strength grossly 5/5 MMT to improve postural control and reduce pain with household tasks Baseline: DNF endurance 6 seconds, shoulder strength 4/5 MMT 02/08/2022: shoulder strength grossly 5/5, DNF endurance 24 seconds Goal status: MET     PLAN: PT FREQUENCY: 1x/week   PT DURATION: 8 weeks   PLANNED INTERVENTIONS: Therapeutic exercises, Therapeutic activity, Neuromuscular re-education, Balance training, Gait training, Patient/Family education, Self Care, Joint mobilization, Joint manipulation, Aquatic Therapy, Dry Needling, Electrical stimulation, Spinal manipulation, Spinal mobilization, Cryotherapy, Moist heat, Taping, Manual therapy, and Re-evaluation   PLAN FOR NEXT SESSION: NA - discharge   Hilda Blades, PT, DPT, LAT, ATC 02/08/22  10:16 AM Phone: [phone] Fax: [phone]     PHYSICAL THERAPY DISCHARGE SUMMARY  Visits from Start of Care: 5  Current functional level related to goals / functional outcomes: See above   Remaining deficits: See above   Education / Equipment: HEP   Patient agrees to discharge. Patient goals were met. Patient is being discharged due to being pleased with the current functional level. And patient scheduled for elbow surgery.

## 2022-02-08 ENCOUNTER — Ambulatory Visit: Payer: Medicare HMO | Admitting: Physical Therapy

## 2022-02-08 ENCOUNTER — Other Ambulatory Visit: Payer: Self-pay

## 2022-02-08 ENCOUNTER — Encounter: Payer: Self-pay | Admitting: Physical Therapy

## 2022-02-08 DIAGNOSIS — M6281 Muscle weakness (generalized): Secondary | ICD-10-CM

## 2022-02-08 DIAGNOSIS — M542 Cervicalgia: Secondary | ICD-10-CM

## 2022-02-08 NOTE — Patient Instructions (Signed)
Access Code: [facility] URL: [email or web address] Date: 02/08/2022 Prepared by: Hilda Blades  Exercises - Supine Deep Neck Flexor Training  - 2 x daily - 2 sets - 10 reps - Sidelying Thoracic Lumbar Rotation  - 2 x daily - 10 reps - Seated Neck Retraction  - 2 x daily - 10 reps - 5 seconds hold - Cervical Retraction at Wall  - 2 x daily - 10 reps - 5 seconds hold - Seated Cervical Sidebending Stretch  - 2 x daily - 3 reps - 15 seconds hold - Shoulder External Rotation and Scapular Retraction with Resistance  - 2 x daily - 2 sets - 12 reps - Standing Shoulder Horizontal Abduction with Resistance  - 1 x daily - 2 sets - 10 reps - Alternating star pattern  - 1 x daily - 7 x weekly - 2 sets - 10 reps - Standing Row with Anchored Resistance  - 1 x daily - 2 sets - 10 reps - Shoulder Extension with Resistance  - 1 x daily - 2 sets - 10 reps

## 2022-02-11 DIAGNOSIS — G5622 Lesion of ulnar nerve, left upper limb: Secondary | ICD-10-CM | POA: Diagnosis not present

## 2022-02-18 DIAGNOSIS — E1142 Type 2 diabetes mellitus with diabetic polyneuropathy: Secondary | ICD-10-CM | POA: Diagnosis not present

## 2022-02-18 DIAGNOSIS — E1165 Type 2 diabetes mellitus with hyperglycemia: Secondary | ICD-10-CM | POA: Diagnosis not present

## 2022-02-19 ENCOUNTER — Other Ambulatory Visit: Payer: Self-pay | Admitting: Internal Medicine

## 2022-03-06 ENCOUNTER — Ambulatory Visit (INDEPENDENT_AMBULATORY_CARE_PROVIDER_SITE_OTHER)
Admission: RE | Admit: 2022-03-06 | Discharge: 2022-03-06 | Disposition: A | Discharge status: Home / Self Care | Payer: Medicare HMO | Source: Ambulatory Visit | Attending: Vascular Surgery | Admitting: Vascular Surgery

## 2022-03-06 ENCOUNTER — Ambulatory Visit (HOSPITAL_COMMUNITY)
Admission: RE | Admit: 2022-03-06 | Discharge: 2022-03-06 | Disposition: A | Discharge status: Home / Self Care | Payer: Medicare HMO | Source: Ambulatory Visit | Attending: Vascular Surgery | Admitting: Vascular Surgery

## 2022-03-06 DIAGNOSIS — I739 Peripheral vascular disease, unspecified: Secondary | ICD-10-CM

## 2022-03-06 LAB — VAS US ABI WITH/WO TBI
Left ABI: 1
Right ABI: 1.12

## 2022-03-12 ENCOUNTER — Encounter: Payer: Self-pay | Admitting: Vascular Surgery

## 2022-03-12 ENCOUNTER — Ambulatory Visit (INDEPENDENT_AMBULATORY_CARE_PROVIDER_SITE_OTHER): Payer: Medicare HMO | Admitting: Vascular Surgery

## 2022-03-12 VITALS — BP 113/82 | HR 87 | Temp 97.1°F | Resp 14 | Ht 61.0 in | Wt 192.0 lb

## 2022-03-12 DIAGNOSIS — Z9889 Other specified postprocedural states: Secondary | ICD-10-CM | POA: Diagnosis not present

## 2022-03-12 DIAGNOSIS — I739 Peripheral vascular disease, unspecified: Secondary | ICD-10-CM | POA: Diagnosis not present

## 2022-03-12 MED ORDER — CILOSTAZOL 100 MG PO TABS: 100.0000 mg | ORAL_TABLET | Freq: Two times a day (BID) | ORAL | 11 refills | Status: DC

## 2022-03-12 NOTE — Progress Notes (Signed)
Patient name: Autumn Valenzuela MRN: [id number] DOB: 07-26-1960 Sex: female  REASON FOR VISIT: 9 month follow-up for surveillance left common femoral endarterectomy  HPI: [name] is a 62 y.o. female with hx HTN, HLD, DM, PAD that presents for 9 month follow-up after left common femoral endarterectomy.  She underwent a left common femoral endarterectomy with bovine patch on 02/12/2021 for lifestyle limiting claudication.    She feels that her left leg claudication is returning.  She has some cramping in the calf and thigh.  This usually happens after walking [place] 3 floors [place] the mall while working for [employer].  She is not smoking.  She is taking aspirin statin daily.  Past Medical History:  Diagnosis Date   Allergy    Anemia    Anxiety    Arthritis    Asthma    Back pain    Bipolar disorder ([place])    CVA (cerebral infarction)    Depression    Diabetes mellitus without complication ([place])    High cholesterol    Hypertension    Left leg pain    Migraine    Sleep apnea    does not use a cpap   Stroke ([place]) 2007   no lasting weakness    Past Surgical History:  Procedure Laterality Date   ABDOMINAL AORTOGRAM W/LOWER EXTREMITY N/A 02/08/2021   Procedure: ABDOMINAL AORTOGRAM W/LOWER EXTREMITY;  Surgeon: Marty Heck, MD;  Location: [facility];  Service: Cardiovascular;  Laterality: N/A;   ABDOMINAL HYSTERECTOMY     ANTERIOR LATERAL LUMBAR FUSION WITH PERCUTANEOUS SCREW 1 LEVEL Left 02/11/2018   Procedure: LEFT LATERAL LUMBAR  FOUR-FIVE INTERBODY FUSION WITH INSTRUMENTATION AND ALLOGRAFT;  Surgeon: Phylliss Bob, MD;  Location: [facility];  Service: Orthopedics;  Laterality: Left;  LEFT LATERAL LUMBAR  FOUR-FIVE INTERBODY FUSION WITH INSTRUMENTATION AND ALLOGRAFT   BACK SURGERY     Lumbar fusion L5-S1   DIABETES     ENDARTERECTOMY FEMORAL Left 02/12/2021   Procedure: LEFT COMMON FEMORAL ENDARTERECTOMY WITH BOVINE PATCH;  Surgeon: Marty Heck, MD;  Location: [facility];  Service: Vascular;  Laterality: Left;   EYE SURGERY Bilateral    INGUINAL LYMPH NODE BIOPSY Left 07/25/2021   Procedure: EXCISIONAL BIOPSY LEFT INGUINAL LYMPH NODES;  Surgeon: Coralie Keens, MD;  Location: [facility];  Service: General;  Laterality: Left;   KNEE SURGERY     l 4-l5 status post lateral posterior fusion January 30,2020     NEUROFORAMINAL STENOSIS Left    Severe L4-L5, involving the level above previous fusion.   PARTIAL HYSTERECTOMY     RADICULOPATHY Left    L4, Secondary to an L4-L5 Spondylolisthesis   SHOULDER SURGERY      Family History  Problem Relation Age of Onset   Diabetes Mother    Stroke Father    Cancer Paternal Aunt    Cancer Paternal Aunt    Diabetes Brother    Colon cancer Neg Hx    Esophageal cancer Neg Hx    Rectal cancer Neg Hx    Stomach cancer Neg Hx     SOCIAL HISTORY: Social History   Tobacco Use   Smoking status: Former    Types: Cigarettes    Quit date: 12/13/2015    Years since quitting: 6.2   Smokeless tobacco: Never  Substance Use Topics   Alcohol use: Not Currently    Comment: Occas    No Known Allergies  Current Outpatient Medications  Medication Sig Dispense Refill   Accu-Chek FastClix Lancets MISC TEST 4 TIMES DAILY. DX E11.9 306 each 3   albuterol (PROVENTIL) (2.5 MG/3ML) 0.083% nebulizer solution Take 2.5 mg by nebulization every 6 (six) hours as needed for wheezing.     albuterol (VENTOLIN HFA) 108 (90 Base) MCG/ACT inhaler Inhale 2 puffs into the lungs daily as needed for wheezing or shortness of breath. 18 g 2   aspirin EC 81 MG EC tablet Take 1 tablet (81 mg total) by mouth daily at 6 (six) AM. Swallow whole. 30 tablet 11   Blood Glucose Calibration (ACCU-CHEK GUIDE CONTROL) LIQD 1 each by In Vitro route every 30 (thirty) days. Needs Accu-check guide monitor system. DX. E11.9 (Patient taking differently: 1 each by In Vitro route every 30 (thirty) days. Needs Accu-check guide monitor system. DX.  E11.9 2 times daily) 3 each 3   blood glucose meter kit and supplies Dispense based on patient and insurance preference. Use up to four times daily as directed. (FOR ICD-10 E10.9, E11.9). 1 each 0   buPROPion (WELLBUTRIN) 75 MG tablet Take 1 tablet (75 mg total) by mouth 2 (two) times daily. 180 tablet 3   Cholecalciferol (VITAMIN D3) 1.25 MG (50000 UT) CAPS Take 1 capsule by mouth once a week. 4 capsule 0   Continuous Blood Gluc Sensor (DEXCOM G6 SENSOR) MISC 1 Device by Does not apply route as directed. 9 each 3   Continuous Blood Gluc Transmit (DEXCOM G6 TRANSMITTER) MISC 1 Device by Does not apply route as directed. 1 each 3   diclofenac Sodium (VOLTAREN) 1 % GEL Apply 2 g topically 4 (four) times daily. (Patient taking differently: Apply 2 g topically 4 (four) times daily as needed (pain).) 100 g 1   DULoxetine (CYMBALTA) 60 MG capsule Take 1 capsule (60 mg total) by mouth 2 (two) times daily. 180 capsule 3   empagliflozin (JARDIANCE) 25 MG TABS tablet Take 1 tablet (25 mg total) by mouth daily. 90 tablet 3   ferrous sulfate 325 (65 FE) MG tablet Take 1 tablet (325 mg total) by mouth every other day. (Patient taking differently: Take 325 mg by mouth daily.) 45 tablet 3   fluticasone (FLONASE) 50 MCG/ACT nasal spray Place 2 sprays into both nostrils daily as needed for allergies. 16 g 5   glucose blood (ONETOUCH VERIO) test strip CHECK UPTO FOUR TIMES DAILY AS DIRECTED 100 strip 5   HYDROcodone-acetaminophen (NORCO/VICODIN) 5-325 MG tablet Take 2 tablets by mouth every 6 (six) hours as needed. 10 tablet 0   insulin aspart (NOVOLOG FLEXPEN) 100 UNIT/ML FlexPen Max daily 30 units (Patient taking differently: Inject 1-4 Units into the skin 3 (three) times daily as needed (blood sugar over 150). Max daily 30 units) 15 mL 6   Insulin Glargine (BASAGLAR KWIKPEN) 100 UNIT/ML Inject 18 Units into the skin daily. 15 mL 11   Insulin Pen Needle 31G X 5 MM MISC 1 Units by Does not apply route in the morning,  at noon, in the evening, and at bedtime. 400 each 1   metFORMIN (GLUCOPHAGE-XR) 500 MG 24 hr tablet Take 1 tablet (500 mg total) by mouth in the morning and at bedtime. 180 tablet 3   pregabalin (LYRICA) 75 MG capsule Take 1 capsule (75 mg total) by mouth 2 (two) times daily. 60 capsule 5   rosuvastatin (CRESTOR) 20 MG tablet TAKE 1 TABLET(20 MG) BY MOUTH DAILY 90 tablet 3   Semaglutide,0.25 or 0.'5MG'$ /DOS, (OZEMPIC, 0.25 OR 0.5 MG/DOSE,) 2  MG/3ML SOPN Inject 0.5 mg into the skin once a week. 9 mL 3   tiZANidine (ZANAFLEX) 4 MG tablet Take 1 tablet (4 mg total) by mouth every 6 (six) hours as needed for muscle spasms. 30 tablet 0   No current facility-administered medications for this visit.    REVIEW OF SYSTEMS:  '[X]'$  denotes positive finding, '[ ]'$  denotes negative finding Cardiac  Comments:  Chest pain or chest pressure:    Shortness of breath upon exertion:    Short of breath when lying flat:    Irregular heart rhythm:        Vascular    Pain in calf, thigh, or hip brought on by ambulation: x Left calf/thigh  Pain in feet at night that wakes you up from your sleep:     Blood clot in your veins:    Leg swelling:         Pulmonary    Oxygen at home:    Productive cough:     Wheezing:         Neurologic    Sudden weakness in arms or legs:     Sudden numbness in arms or legs:     Sudden onset of difficulty speaking or slurred speech:    Temporary loss of vision in one eye:     Problems with dizziness:         Gastrointestinal    Blood in stool:     Vomited blood:         Genitourinary    Burning when urinating:     Blood in urine:        Psychiatric    Major depression:         Hematologic    Bleeding problems:    Problems with blood clotting too easily:        Skin    Rashes or ulcers:        Constitutional    Fever or chills:      PHYSICAL EXAM: Vitals:   03/12/22 0828  BP: 113/82  Pulse: 87  Resp: 14  Temp: (!) 97.1 F (36.2 C)  TempSrc: Temporal   SpO2: 98%  Weight: 192 lb (87.1 kg)  Height: '5\' 1"'$  (1.549 m)    GENERAL: The patient is a well-nourished female, in no acute distress. The vital signs are documented above. CARDIAC: There is a regular rate and rhythm.  VASCULAR:  Left femoral pulse palpable Left DP palpable Left groin incision well healed Right DP palpable  DATA:   Left lower extremity arterial duplex shows a 50 to 74% stenosis in the iliac segment as well as the common femoral artery.  ABI 1.12 biphasic right and 1.0 biphasic left   Assessment/Plan:  62 y.o. female with hx HTN, HLD, DM, PAD that presents for 9 month follow-up after left common femoral endarterectomy.  She underwent a left common femoral endarterectomy with bovine patch on 02/12/2021 for lifestyle limiting claudication.    Her duplex shows some moderate stenosis in the iliac artery as well as the common femoral.  She does have a palpable femoral pulse as well as a DP pulse on exam.  She does endorse some recurrent left lower extremity claudication particular when [place] 3 floors [place] mall.  I have asked that she try medical therapy given she is able to do most of her activities at this time.  I have prescribed Pletal twice daily.  I will see her in 3 months to see  how she is progressing.  If no improvement likely would need CTA and then make a decision about further intervention.   Marty Heck, MD [facility] Office: [phone]

## 2022-03-20 DIAGNOSIS — E1165 Type 2 diabetes mellitus with hyperglycemia: Secondary | ICD-10-CM | POA: Diagnosis not present

## 2022-03-20 DIAGNOSIS — E1142 Type 2 diabetes mellitus with diabetic polyneuropathy: Secondary | ICD-10-CM | POA: Diagnosis not present

## 2022-03-25 ENCOUNTER — Encounter: Payer: Self-pay | Admitting: Internal Medicine

## 2022-03-25 ENCOUNTER — Ambulatory Visit (INDEPENDENT_AMBULATORY_CARE_PROVIDER_SITE_OTHER): Payer: Medicare HMO | Admitting: Internal Medicine

## 2022-03-25 VITALS — BP 124/80 | HR 79 | Ht 61.0 in | Wt 195.0 lb

## 2022-03-25 DIAGNOSIS — E1142 Type 2 diabetes mellitus with diabetic polyneuropathy: Secondary | ICD-10-CM

## 2022-03-25 DIAGNOSIS — Z794 Long term (current) use of insulin: Secondary | ICD-10-CM | POA: Diagnosis not present

## 2022-03-25 DIAGNOSIS — E1159 Type 2 diabetes mellitus with other circulatory complications: Secondary | ICD-10-CM

## 2022-03-25 LAB — POCT GLYCOSYLATED HEMOGLOBIN (HGB A1C): Hemoglobin A1C: 6.8 % — AB (ref 4.0–5.6)

## 2022-03-25 MED ORDER — METFORMIN HCL ER 500 MG PO TB24: 500.0000 mg | ORAL_TABLET | Freq: Two times a day (BID) | ORAL | 3 refills | Status: DC

## 2022-03-25 MED ORDER — EMPAGLIFLOZIN 25 MG PO TABS: 25.0000 mg | ORAL_TABLET | Freq: Every day | ORAL | 3 refills | Status: DC

## 2022-03-25 MED ORDER — INSULIN PEN NEEDLE 31G X 5 MM MISC: 1.0000 [IU] | Freq: Four times a day (QID) | 3 refills | Status: DC

## 2022-03-25 MED ORDER — SEMAGLUTIDE (1 MG/DOSE) 4 MG/3ML ~~LOC~~ SOPN: 1.0000 mg | PEN_INJECTOR | SUBCUTANEOUS | 3 refills | Status: DC

## 2022-03-25 MED ORDER — BASAGLAR KWIKPEN 100 UNIT/ML ~~LOC~~ SOPN: 14.0000 [IU] | PEN_INJECTOR | Freq: Every day | SUBCUTANEOUS | 11 refills | Status: DC

## 2022-03-25 NOTE — Progress Notes (Signed)
Name: Autumn Valenzuela  Age/ Sex: 62 y.o., female   MRN/ DOB: [id number], 08/26/1960     PCP: Flossie Buffy, NP   Reason for Endocrinology Evaluation: Type 2 Diabetes Mellitus  Initial Endocrine Consultative Visit: 03/08/2019    PATIENT IDENTIFIER: [name] is a 62 y.o. female with a past medical history of .T2Dm, bipolar disorder,migrane headaches  and dyslipidemia  The patient has followed with Endocrinology clinic since 03/08/2019 for consultative assistance with management of her diabetes.  DIABETIC HISTORY:  Ms. Giannini was diagnosed with T2DM in 2018.  She has been tried on oral glycemic agents such as glipizide and Metformin, she is intolerant to higher doses of Metformin.  She has been on insulin since 2018.  Her hemoglobin A1c has ranged from  6.7% in 2018, peaking at 14.6% in 2021   On her initial visit to our clinic she had an A1c of 14.6%, she was on metformin and Lantus. We continued that and started Jardiance. By 09/2020 we provided her with novolog per correction scale   Started Ozempic  SUBJECTIVE:   During the last visit (09/24/2021): A1c of 5.8 %.     Today (03/25/2022): [name] is here for follow-up on diabetes management. .She checks her  glucose multiple times daily through CGM. She has had multiple episodes of hypoglycemic episodes, she is symptomatic with these episodes.    She was evaluated by vascular surgery for claudications 02/2022, undergoing medical therapy Weight stable   Denies nausea, vomiting  Started diarrhea last night  Denies fever  She has been coughing with dry cough, grandson has been sick    HOME DIABETES REGIMEN:  Metformin 500 mg BID  Lantus 18 units daily Jardiance 25 mg daily  Ozempic  0.5 mg weekly ( Sundays) Correction scale : Novolog (BG- 130/20)     Statin: Yes ACE-I/ARB: No  CONTINUOUS GLUCOSE MONITORING RECORD INTERPRETATION    Dates of Recording: 2/27-3/11/2022  Sensor description:dexcom  Results  statistics:   CGM use % of time 7  Average and SD 113/31  Time in range 92   %  % Time Above 180 6  % Time above 250 0  % Time Below target 2   Glycemic patterns summary: optimal Bg's during the day and night   Hyperglycemic episodes n/a  Hypoglycemic episodes occurred middle of the day  Overnight periods: optimal       DIABETIC COMPLICATIONS: Microvascular complications:  Neuropathy Denies: CKD, retinopathy  Last eye exam: Completed 08/02/2021   Macrovascular complications:   S/P left common femoral endarterectomy with bovine patch on 02/12/2021 Denies: CAD, CVA     HISTORY:  Past Medical History:  Past Medical History:  Diagnosis Date   Allergy    Anemia    Anxiety    Arthritis    Asthma    Back pain    Bipolar disorder ([place])    CVA (cerebral infarction)    Depression    Diabetes mellitus without complication ([facility])    High cholesterol    Hypertension    Left leg pain    Migraine    Sleep apnea    does not use a cpap   Stroke ([place]) 2007   no lasting weakness   Past Surgical History:  Past Surgical History:  Procedure Laterality Date   ABDOMINAL AORTOGRAM W/LOWER EXTREMITY N/A 02/08/2021   Procedure: ABDOMINAL AORTOGRAM W/LOWER EXTREMITY;  Surgeon: Marty Heck, MD;  Location: [facility];  Service: Cardiovascular;  Laterality: N/A;   ABDOMINAL HYSTERECTOMY     ANTERIOR LATERAL LUMBAR FUSION WITH PERCUTANEOUS SCREW 1 LEVEL Left 02/11/2018   Procedure: LEFT LATERAL LUMBAR  FOUR-FIVE INTERBODY FUSION WITH INSTRUMENTATION AND ALLOGRAFT;  Surgeon: Phylliss Bob, MD;  Location: [facility];  Service: Orthopedics;  Laterality: Left;  LEFT LATERAL LUMBAR  FOUR-FIVE INTERBODY FUSION WITH INSTRUMENTATION AND ALLOGRAFT   BACK SURGERY     Lumbar fusion L5-S1   DIABETES     ENDARTERECTOMY FEMORAL Left 02/12/2021   Procedure: LEFT COMMON FEMORAL ENDARTERECTOMY WITH BOVINE PATCH;  Surgeon: Marty Heck, MD;  Location: [facility];  Service: Vascular;   Laterality: Left;   EYE SURGERY Bilateral    INGUINAL LYMPH NODE BIOPSY Left 07/25/2021   Procedure: EXCISIONAL BIOPSY LEFT INGUINAL LYMPH NODES;  Surgeon: Coralie Keens, MD;  Location: [facility];  Service: General;  Laterality: Left;   KNEE SURGERY     l 4-l5 status post lateral posterior fusion January 30,2020     NEUROFORAMINAL STENOSIS Left    Severe L4-L5, involving the level above previous fusion.   PARTIAL HYSTERECTOMY     RADICULOPATHY Left    L4, Secondary to an L4-L5 Spondylolisthesis   SHOULDER SURGERY     Social History:  reports that she quit smoking about 6 years ago. Her smoking use included cigarettes. She has never used smokeless tobacco. She reports that she does not currently use alcohol. She reports that she does not currently use drugs after having used the following drugs: Marijuana. Family History:  Family History  Problem Relation Age of Onset   Diabetes Mother    Stroke Father    Cancer Paternal Aunt    Cancer Paternal Aunt    Diabetes Brother    Colon cancer Neg Hx    Esophageal cancer Neg Hx    Rectal cancer Neg Hx    Stomach cancer Neg Hx      HOME MEDICATIONS: Allergies as of 03/25/2022   No Known Allergies      Medication List        Accurate as of March 25, 2022  8:33 AM. If you have any questions, ask your nurse or doctor.          STOP taking these medications    Dexcom G6 Sensor Misc Stopped by: Dorita Sciara, MD   Dexcom G6 Transmitter Misc Stopped by: Dorita Sciara, MD   Ozempic (0.25 or 0.5 MG/DOSE) 2 MG/3ML Sopn Generic drug: Semaglutide(0.25 or 0.'5MG'$ /DOS) Replaced by: Semaglutide (1 MG/DOSE) 4 MG/3ML Sopn Stopped by: Dorita Sciara, MD       TAKE these medications    Accu-Chek FastClix Lancets Misc TEST 4 TIMES DAILY. DX E11.9   Accu-Chek Guide Control Liqd 1 each by In Vitro route every 30 (thirty) days. Needs Accu-check guide monitor system. DX. E11.9 What changed:  additional instructions   albuterol 108 (90 Base) MCG/ACT inhaler Commonly known as: VENTOLIN HFA Inhale 2 puffs into the lungs daily as needed for wheezing or shortness of breath.   albuterol (2.5 MG/3ML) 0.083% nebulizer solution Commonly known as: PROVENTIL Take 2.5 mg by nebulization every 6 (six) hours as needed for wheezing.   aspirin EC 81 MG tablet Take 1 tablet (81 mg total) by mouth daily at 6 (six) AM. Swallow whole.   Basaglar KwikPen 100 UNIT/ML Inject 14 Units into the skin daily. What changed: how much to take Changed by: Dorita Sciara, MD   blood glucose meter kit and supplies  Dispense based on patient and insurance preference. Use up to four times daily as directed. (FOR ICD-10 E10.9, E11.9).   buPROPion 75 MG tablet Commonly known as: WELLBUTRIN Take 1 tablet (75 mg total) by mouth 2 (two) times daily.   cilostazol 100 MG tablet Commonly known as: PLETAL Take 1 tablet (100 mg total) by mouth 2 (two) times daily before a meal.   diclofenac Sodium 1 % Gel Commonly known as: Voltaren Apply 2 g topically 4 (four) times daily. What changed:  when to take this reasons to take this   DULoxetine 60 MG capsule Commonly known as: CYMBALTA Take 1 capsule (60 mg total) by mouth 2 (two) times daily.   empagliflozin 25 MG Tabs tablet Commonly known as: Jardiance Take 1 tablet (25 mg total) by mouth daily.   ferrous sulfate 325 (65 FE) MG tablet Take 1 tablet (325 mg total) by mouth every other day. What changed: when to take this   fluticasone 50 MCG/ACT nasal spray Commonly known as: FLONASE Place 2 sprays into both nostrils daily as needed for allergies.   HYDROcodone-acetaminophen 5-325 MG tablet Commonly known as: NORCO/VICODIN Take 2 tablets by mouth every 6 (six) hours as needed.   Insulin Pen Needle 31G X 5 MM Misc 1 Units by Does not apply route in the morning, at noon, in the evening, and at bedtime.   metFORMIN 500 MG 24 hr  tablet Commonly known as: GLUCOPHAGE-XR Take 1 tablet (500 mg total) by mouth in the morning and at bedtime.   NovoLOG FlexPen 100 UNIT/ML FlexPen Generic drug: insulin aspart Max daily 30 units What changed:  how much to take how to take this when to take this reasons to take this   OneTouch Verio test strip Generic drug: glucose blood CHECK UPTO FOUR TIMES DAILY AS DIRECTED   pregabalin 75 MG capsule Commonly known as: LYRICA Take 1 capsule (75 mg total) by mouth 2 (two) times daily.   rosuvastatin 20 MG tablet Commonly known as: CRESTOR TAKE 1 TABLET(20 MG) BY MOUTH DAILY   Semaglutide (1 MG/DOSE) 4 MG/3ML Sopn Inject 1 mg as directed once a week. Replaces: Ozempic (0.25 or 0.5 MG/DOSE) 2 MG/3ML Sopn Started by: Dorita Sciara, MD   tiZANidine 4 MG tablet Commonly known as: Zanaflex Take 1 tablet (4 mg total) by mouth every 6 (six) hours as needed for muscle spasms.   Vitamin D3 1.25 MG (50000 UT) capsule Generic drug: Cholecalciferol Take 1 capsule by mouth once a week.         OBJECTIVE:   Vital Signs: BP 124/80 (BP Location: Left Arm, Patient Position: Sitting, Cuff Size: Large)   Pulse 79   Ht '5\' 1"'$  (1.549 m)   Wt 195 lb (88.5 kg)   SpO2 98%   BMI 36.84 kg/m   Wt Readings from Last 3 Encounters:  03/25/22 195 lb (88.5 kg)  03/12/22 192 lb (87.1 kg)  11/14/21 198 lb (89.8 kg)     Exam: General: Pt appears well and is in NAD  Lungs: Clear with good BS bilat with no rales, rhonchi, or wheezes  Heart: RRR  Extremities: No pretibial edema.   Neuro: MS is good with appropriate affect, pt is alert and Ox3   DM Foot Exam 03/25/2022    The skin of the feet is intact without sores or ulcerations. The pedal pulses are undetectable  The sensation is absent to a screening 5.07, 10 gram monofilament bilaterally    DATA REVIEWED:  Lab Results  Component Value Date   HGBA1C 6.8 (A) 03/25/2022   HGBA1C 5.8 (A) 09/24/2021   HGBA1C 9.2 (A)  05/18/2021   Lab Results  Component Value Date   MICROALBUR 1.4 01/17/2021   LDLCALC 123 (H) 02/13/2021   CREATININE 0.73 07/25/2021   Lab Results  Component Value Date   MICRALBCREAT 0.8 01/17/2021    Latest Reference Range & Units 07/25/21 10:50 08/27/21 11:33  Sodium 135 - 145 mmol/L 143   Potassium 3.5 - 5.1 mmol/L 3.7   Chloride 98 - 111 mmol/L 109   CO2 22 - 32 mmol/L 22   Glucose 70 - 99 mg/dL 98   BUN 6 - 20 mg/dL 9   Creatinine 0.44 - 1.00 mg/dL 0.73   Calcium 8.9 - 10.3 mg/dL 9.2   Anion gap 5 - 15  12   Alkaline Phosphatase 39 - 117 U/L  59  Albumin 3.5 - 5.2 g/dL  4.2  AST 0 - 37 U/L  11  ALT 0 - 35 U/L  8  Total Protein 6.0 - 8.3 g/dL  7.4  Bilirubin, Direct 0.0 - 0.3 mg/dL  0.1  Total Bilirubin 0.2 - 1.2 mg/dL  0.3  GFR, Estimated >60 mL/min >60      ASSESSMENT / PLAN / RECOMMENDATIONS:   1) Type 2 Diabetes Mellitus, Optimally  controlled , With neuropathic and macrovascular complications - Most recent A1c of 6.8 %. Goal A1c <7.0%.    -A1c has trended up, but continues to be within goal -Patient has been noted with hypoglycemia on Dexcom download, will decrease Lantus as below -I will also increase her Ozempic to improve glycemic control and weight loss - The goal in the future is to wean her off insulin -Intolerant to higher doses of metformin  MEDICATIONS: Increase Ozempic 1 mg weekly Decrease Lantus 14 units daily Continue Metformin 500 mg 1 tablet with breakfast Continue Jardiance 25 mg daily Correction scale : Novolog (BG- 130/20)   EDUCATION / INSTRUCTIONS: BG monitoring instructions: Patient is instructed to check her blood sugars 2 times a day, fasting and bedtime as much as possible Call [facility] if: BG persistently  <70 I reviewed the Rule of 15 for the treatment of hypoglycemia in detail with the patient. Literature supplied.    2) Diabetic complications:  Eye: Does not have known diabetic retinopathy.  Neuro/  Feet: Does have known diabetic peripheral neuropathy. Renal: Patient does not have known baseline CKD. She is not an ACEI/ARB at present     F/U in 6 months    Signed electronically by: Mack Guise, MD  [facility]  [facility] [address] [place], [place] [address] Phone: [phone] FAX: [phone]   CC: Flossie Buffy, [place] [address] Phone: [phone]  Fax: [phone]  Return to Endocrinology clinic as below: Future Appointments  Date Time Provider [place]  04/18/2022  3:00 PM Germaine Pomfret [place] None  06/11/2022  8:40 AM Marty Heck, MD [facility]

## 2022-03-25 NOTE — Patient Instructions (Addendum)
-   INcrease  Ozempic 1 mg weekly  - Decrease Lantus 14 units daily  - Continue Metformin 500 mg 1 tablet Twice a day - Continue Jardiance to 25 mg , 1 tablet daily  Novolog correctional insulin: Use the scale below to help guide you:   Blood sugar before meal Number of units to inject  Less than 150 0 unit  151 -  170 1 units  171 -  190 2 units  191 -  210 3 units  211 -  230 4 units  231 -  250 5 units  251 -  270 6 units  271 -  290 7 units  291 -  310 8 units  311 - 330 9 units   331 -  350 10 units         HOW TO TREAT LOW BLOOD SUGARS (Blood sugar LESS THAN 70 MG/DL) Please follow the RULE OF 15 for the treatment of hypoglycemia treatment (when your (blood sugars are less than 70 mg/dL)   STEP 1: Take 15 grams of carbohydrates when your blood sugar is low, which includes:  3-4 GLUCOSE TABS  OR 3-4 OZ OF JUICE OR REGULAR SODA OR ONE TUBE OF GLUCOSE GEL    STEP 2: RECHECK blood sugar in 15 MINUTES STEP 3: If your blood sugar is still low at the 15 minute recheck --> then, go back to STEP 1 and treat AGAIN with another 15 grams of carbohydrates.

## 2022-04-18 ENCOUNTER — Ambulatory Visit: Payer: Medicare HMO

## 2022-04-18 DIAGNOSIS — I739 Peripheral vascular disease, unspecified: Secondary | ICD-10-CM

## 2022-04-18 DIAGNOSIS — Z794 Long term (current) use of insulin: Secondary | ICD-10-CM

## 2022-04-18 NOTE — Progress Notes (Signed)
Chronic Care Management Pharmacy Note  04/18/2022 Name:  Autumn Valenzuela MRN:  [id number] DOB:  Aug 02, 1960  Summary: Patient presents for CCM follow-up.   Patient tolerating increased dose of Ozempic well.   Patient was also started on cilostazol, but has not noticed an impact on her exercise tolerance or pain control.   Recommendations/Changes made from today's visit: Continue current medications  Plan: CPP follow-up 6 months  Subjective: Autumn Valenzuela is an 62 y.o. year old female who is a primary patient of Nche, Charlene Brooke, NP.  The CCM team was consulted for assistance with disease management and care coordination needs.    Engaged with patient by telephone for follow up visit in response to provider referral for pharmacy case management and/or care coordination services.   Consent to Services:  The patient was given information about Chronic Care Management services, agreed to services, and gave verbal consent prior to initiation of services.  Please see initial visit note for detailed documentation.   Patient Care Team: Nche, Charlene Brooke, NP as PCP - General (Internal Medicine) Germaine Pomfret, [facility] as Pharmacist (Pharmacist)  Recent office visits: 08/27/21: Patient presented to Wilfred Lacy, NP for follow-up.   Recent consult visits: 03/25/22: Patient presented to Dr. Kelton Pillar (Endocrinology). Ozempic 1 mg.  03/12/22: Patient presented to Dr. Carlis Abbott (Vascular). Cilostazol.   Hospital visits: 12/12/21: Patient presented to ED for neck pain.   Objective:  Lab Results  Component Value Date   CREATININE 0.73 07/25/2021   BUN 9 07/25/2021   GFR 95.70 01/17/2021   GFRNONAA >60 07/25/2021   GFRAA >60 02/09/2018   NA 143 07/25/2021   K 3.7 07/25/2021   CALCIUM 9.2 07/25/2021   CO2 22 07/25/2021   GLUCOSE 98 07/25/2021    Lab Results  Component Value Date/Time   HGBA1C 6.8 (A) 03/25/2022 08:21 AM   HGBA1C 5.8 (A) 09/24/2021 08:11 AM   HGBA1C 8.7 (H)  02/12/2021 01:14 PM   HGBA1C 7.9 (H) 11/28/2020 09:30 AM   GFR 95.70 01/17/2021 08:36 AM   GFR 92.44 09/05/2020 09:39 AM   MICROALBUR 1.4 01/17/2021 08:36 AM   MICROALBUR <0.7 09/22/2019 01:27 PM    Last diabetic Eye exam:  Lab Results  Component Value Date/Time   HMDIABEYEEXA No Retinopathy 08/02/2021 12:00 AM    Last diabetic Foot exam: No results found for: "HMDIABFOOTEX"   Lab Results  Component Value Date   CHOL 175 02/13/2021   HDL 37 (L) 02/13/2021   LDLCALC 123 (H) 02/13/2021   LDLDIRECT 123.0 08/27/2021   TRIG 75 02/13/2021   CHOLHDL 4.7 02/13/2021       Latest Ref Rng & Units 08/27/2021   11:33 AM 02/12/2021    8:21 AM 01/17/2021    8:36 AM  Hepatic Function  Total Protein 6.0 - 8.3 g/dL 7.4  6.6  7.1   Albumin 3.5 - 5.2 g/dL 4.2  3.4  4.0   AST 0 - 37 U/L 11  10  10    ALT 0 - 35 U/L 8  10  9    Alk Phosphatase 39 - 117 U/L 59  57  63   Total Bilirubin 0.2 - 1.2 mg/dL 0.3  0.3  0.4   Bilirubin, Direct 0.0 - 0.3 mg/dL 0.1   0.1     Lab Results  Component Value Date/Time   TSH 0.51 02/04/2019 02:45 PM   TSH 0.43 04/30/2017 09:02 AM   FREET4 1.08 03/24/2016 11:59 AM  Latest Ref Rng & Units 05/02/2021   10:10 AM 04/02/2021    3:37 PM 02/13/2021    2:50 AM  CBC  WBC 4.0 - 10.5 K/uL 10.7  14.5  12.7   Hemoglobin 12.0 - 15.0 g/dL 12.9  12.4  10.5   Hematocrit 36.0 - 46.0 % 40.3  38.9  32.4   Platelets 150.0 - 400.0 K/uL 224.0  229.0  197     Lab Results  Component Value Date/Time   VD25OH 31.23 01/17/2021 08:36 AM   VD25OH 47.98 09/22/2019 01:27 PM    Clinical ASCVD: No  The ASCVD Risk score (Arnett DK, et al., 2019) failed to calculate for the following reasons:   The patient has a prior MI or stroke diagnosis       11/14/2021    8:20 AM 08/27/2021   11:35 AM 06/27/2021   10:40 AM  Depression screen PHQ 2/9  Decreased Interest 1 1 1   Down, Depressed, Hopeless 0 1 1  PHQ - 2 Score 1 2 2   Altered sleeping  3 1  Tired, decreased energy  3 1   Change in appetite  1 1  Feeling bad or failure about yourself   1 1  Trouble concentrating  2 1  Moving slowly or fidgety/restless  3 1  Suicidal thoughts  1 1  PHQ-9 Score  16 9  Difficult doing work/chores  Somewhat difficult Somewhat difficult     Social History   Tobacco Use  Smoking Status Former   Types: Cigarettes   Quit date: 12/13/2015   Years since quitting: 6.3  Smokeless Tobacco Never   BP Readings from Last 3 Encounters:  03/25/22 124/80  03/12/22 113/82  12/12/21 (!) 142/109   Pulse Readings from Last 3 Encounters:  03/25/22 79  03/12/22 87  12/12/21 93   Wt Readings from Last 3 Encounters:  03/25/22 195 lb (88.5 kg)  03/12/22 192 lb (87.1 kg)  11/14/21 198 lb (89.8 kg)   BMI Readings from Last 3 Encounters:  03/25/22 36.84 kg/m  03/12/22 36.28 kg/m  11/14/21 37.41 kg/m    Assessment/Interventions: Review of patient past medical history, allergies, medications, health status, including review of consultants reports, laboratory and other test data, was performed as part of comprehensive evaluation and provision of chronic care management services.   SDOH:  (Social Determinants of Health) assessments and interventions performed: Yes SDOH Interventions    Flowsheet Row Clinical Support from 11/14/2021 in [place] at [place] Management from 05/03/2021 in [place] at [place] from 10/24/2020 in [place] at [place] Interventions Intervention Not Indicated -- Intervention Not Indicated  Housing Interventions Intervention Not Indicated -- Intervention Not Indicated  Transportation Interventions Intervention Not Indicated -- Intervention Not Indicated  Depression Interventions/Treatment  -- -- Patient refuses Treatment  [patient will speak to her doctor  at her appointment]  Financial Strain  Interventions Intervention Not Indicated Intervention Not Indicated Intervention Not Indicated  Physical Activity Interventions Intervention Not Indicated -- Intervention Not Indicated  Stress Interventions -- -- Provide Counseling, Intervention Not Indicated  Social Connections Interventions Intervention Not Indicated -- --       SDOH Screenings   Food Insecurity: No Food Insecurity (11/14/2021)  Housing: [place]  (11/14/2021)  Transportation Needs: No Transportation Needs (11/14/2021)  Utilities: Not At Risk (11/14/2021)  Alcohol Screen: Low Risk  (11/14/2021)  Depression (PHQ2-9): Low  Risk  (11/14/2021)  Recent Concern: Depression (PHQ2-9) - High Risk (08/27/2021)  Financial Resource Strain: Low Risk  (11/14/2021)  Physical Activity: Sufficiently Active (11/14/2021)  Social Connections: Moderately Integrated (11/14/2021)  Stress: No Stress Concern Present (11/14/2021)  Tobacco Use: Medium Risk (03/25/2022)    CCM Care Plan  No Known Allergies  Medications Reviewed Today     Reviewed by Madalyn Rob, LPN (Licensed Practical Nurse) on 03/12/22 at [place] List Status: <None>   Medication Order Taking? Sig Documenting Provider Last Dose Status Informant  Accu-Chek FastClix Lancets MISC [id number] Yes TEST 4 TIMES DAILY. DX E11.9 Nche, Charlene Brooke, NP Taking Active Self  albuterol (PROVENTIL) (2.5 MG/3ML) 0.083% nebulizer solution [id number] Yes Take 2.5 mg by nebulization every 6 (six) hours as needed for wheezing. [provider] Taking Active Self  albuterol (VENTOLIN HFA) 108 (90 Base) MCG/ACT inhaler [id number] Yes Inhale 2 puffs into the lungs daily as needed for wheezing or shortness of breath. McElwee, Lauren A, NP Taking Active   aspirin EC 81 MG EC tablet [id number] Yes Take 1 tablet (81 mg total) by mouth daily at 6 (six) AM. Swallow whole. Baglia, Corrina, PA-C Taking Active   Blood Glucose Calibration (ACCU-CHEK GUIDE CONTROL) LIQD [id number] Yes 1 each by In Vitro  route every 30 (thirty) days. Needs Accu-check guide monitor system. DX. E11.9  Patient taking differently: 1 each by In Vitro route every 30 (thirty) days. Needs Accu-check guide monitor system. DX. E11.9 2 times daily   Elby Beck, FNP Taking Active Self  blood glucose meter kit and supplies [id number] Yes Dispense based on patient and insurance preference. Use up to four times daily as directed. (FOR ICD-10 E10.9, E11.9). Shamleffer, Melanie Crazier, MD Taking Active Self  buPROPion [facility]) 75 MG tablet [id number] Yes Take 1 tablet (75 mg total) by mouth 2 (two) times daily. Nche, Charlene Brooke, NP Taking Active   Cholecalciferol (VITAMIN D3) 1.25 MG (50000 UT) CAPS [id number] Yes Take 1 capsule by mouth once a week. Tonia Ghent, MD Taking Active Self  Continuous Blood Gluc Sensor (DEXCOM G6 SENSOR) MISC [id number] Yes 1 Device by Does not apply route as directed. Shamleffer, Melanie Crazier, MD Taking Active   Continuous Blood Gluc Transmit (DEXCOM G6 TRANSMITTER) MISC [id number] Yes 1 Device by Does not apply route as directed. Shamleffer, Melanie Crazier, MD Taking Active   diclofenac Sodium (VOLTAREN) 1 % GEL [id number] Yes Apply 2 g topically 4 (four) times daily.  Patient taking differently: Apply 2 g topically 4 (four) times daily as needed (pain).   Nche, Charlene Brooke, NP Taking Active Self  DULoxetine (CYMBALTA) 60 MG capsule [id number] Yes Take 1 capsule (60 mg total) by mouth 2 (two) times daily. Nche, Charlene Brooke, NP Taking Active   empagliflozin (JARDIANCE) 25 MG TABS tablet [id number] Yes Take 1 tablet (25 mg total) by mouth daily. Shamleffer, Melanie Crazier, MD Taking Active   ferrous sulfate 325 (65 FE) MG tablet [id number] Yes Take 1 tablet (325 mg total) by mouth every other day.  Patient taking differently: Take 325 mg by mouth daily.   Elby Beck, FNP Taking Active Self  fluticasone (FLONASE) 50 MCG/ACT nasal spray [id number] Yes Place 2 sprays into both  nostrils daily as needed for allergies. Elby Beck, FNP Taking Active Self  glucose blood [facility] VERIO) test strip [id number] Yes CHECK UPTO FOUR TIMES DAILY AS DIRECTED Shamleffer, Melanie Crazier, MD Taking Active   HYDROcodone-acetaminophen (NORCO/VICODIN) 5-325 MG tablet [id number] Yes  Take 2 tablets by mouth every 6 (six) hours as needed. Rondel Oh, MD Taking Active   insulin aspart (NOVOLOG FLEXPEN) 100 UNIT/ML FlexPen [id number] Yes Max daily 30 units  Patient taking differently: Inject 1-4 Units into the skin 3 (three) times daily as needed (blood sugar over 150). Max daily 30 units   Shamleffer, Melanie Crazier, MD Taking Active Self  Insulin Glargine (BASAGLAR KWIKPEN) 100 UNIT/ML [id number] Yes Inject 18 Units into the skin daily. Shamleffer, Melanie Crazier, MD Taking Active   Insulin Pen Needle 31G X 5 MM MISC [id number] Yes 1 Units by Does not apply route in the morning, at noon, in the evening, and at bedtime. Shamleffer, Melanie Crazier, MD Taking Active   metFORMIN (GLUCOPHAGE-XR) 500 MG 24 hr tablet [id number] Yes Take 1 tablet (500 mg total) by mouth in the morning and at bedtime. Shamleffer, Melanie Crazier, MD Taking Active   pregabalin (LYRICA) 75 MG capsule [id number] Yes Take 1 capsule (75 mg total) by mouth 2 (two) times daily. Nche, Charlene Brooke, NP Taking Active   rosuvastatin (CRESTOR) 20 MG tablet [id number] Yes TAKE 1 TABLET(20 MG) BY MOUTH DAILY Nche, Charlene Brooke, NP Taking Active   Semaglutide,0.25 or 0.5MG /DOS, (OZEMPIC, 0.25 OR 0.5 MG/DOSE,) 2 MG/3ML SOPN [id number] Yes Inject 0.5 mg into the skin once a week. Shamleffer, Melanie Crazier, MD Taking Active   tiZANidine (ZANAFLEX) 4 MG tablet [id number] Yes Take 1 tablet (4 mg total) by mouth every 6 (six) hours as needed for muscle spasms. 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   History of lumbar fusion 02/28/2021   Osteoarthritis of glenohumeral joint (Right) 02/28/2021   Osteoarthritis of AC (acromioclavicular) joint (Right) 02/28/2021   Lumbosacral facet arthropathy (Multilevel) 02/28/2021   History of lumbar laminectomy (L4-S1) 02/28/2021   Osteoarthritis of knee (Left) 02/28/2021   Severe obesity (BMI 35.0-39.9) with comorbidity 02/28/2021   Marijuana use 02/28/2021   Chronic lower extremity pain (2ry area of Pain) (Bilateral) (L>R) 02/28/2021   Chronic hip pain (3ry area of Pain) (Bilateral) (L>R) 02/28/2021   Chronic groin pain (Left) 02/28/2021   Impaired range of motion of hip 02/28/2021   PAD (peripheral artery disease) 02/12/2021   Peripheral arterial disease 02/06/2021   Decreased dorsalis pedis pulse 01/19/2021   Diabetes mellitus 01/19/2021   Bipolar disorder 01/17/2021   Pseudoarthrosis of lumbar spine 12/01/2020   Chronic low back pain (1ry area of Pain) (Bilateral) (L>R) w/o sciatica 09/06/2020   Pain in thoracic spine 08/03/2020   Ulnar 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 of left upper extremity 08/09/2019   Pincer nail deformity 05/19/2019   Excessive daytime sleepiness 03/15/2019   Type 2 diabetes mellitus with hyperglycemia, with long-term current use of insulin 03/08/2019   Type 2 diabetes mellitus with diabetic polyneuropathy, with long-term current use of insulin 03/08/2019   Diabetic neuropathy with neurologic complication [id number]   Claudication of both lower extremities 02/04/2019   RLS (restless legs  syndrome)  02/04/2019   Sleep deprivation 02/04/2019   Hot flashes 02/04/2019   Neck pain 02/04/2019   Chronic migraine without aura, with intractable migraine, so stated, with status migrainosus 01/27/2019   Chronic knee pain (4th area of Pain) (Bilateral) (L>R) 06/18/2018   Radiculopathy 02/11/2018   Diabetic ketoacidosis 03/24/2016   Dermatitis 03/24/2016   HTN (hypertension) 03/24/2016   HLD (hyperlipidemia) 03/24/2016   DM (diabetes mellitus) 03/24/2016   Chronic daily headache 11/23/2012   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    Immunization History  Administered Date(s) Administered   Moderna Sars-Covid-2 Vaccination 07/20/2019, 08/20/2019   Pneumococcal Polysaccharide-23 08/25/2017   Zoster Recombinat (Shingrix) 01/19/2021, 03/19/2021    Conditions to be addressed/monitored:  Hypertension, Hyperlipidemia, Diabetes, and Bipolar Disorder, Chronic Pain  There are no care plans that you recently modified to display for this patient.       Medication Assistance: None required.  Patient affirms current coverage meets needs.  Compliance/Adherence/Medication fill history: Care Gaps: Tetanus Vaccine Shingrix Vaccine COVID-19 Vaccine Foot Exam  HTN: 134/95- 04/02/2021  Star-Rating Drugs: Rosuvastatin 20 mg last filled 03/15/2021 for 90 day supply at [facility]. Jardiance 25 mg last filled 04/07/2021 for 100 day supply at [facility]. Metformin 500 mg last filled 01/03/2021 for 900 day supply at [facility].  Patient's preferred pharmacy is:  [facility] [phone] - [place], [place] - [address] [place] [address] Phone: [phone] Fax: [phone]  [facility] - [place], [place] [address] [place] [address] Phone: [phone] Fax: [phone]  Uses pill box? Yes Pt endorses 100%  compliance  We discussed: Current pharmacy is preferred with insurance plan and patient is satisfied with pharmacy services Patient decided to: Continue current medication management strategy  Care Plan and Follow Up Patient Decision:  Patient agrees to Care Plan and Follow-up.  Plan: Telephone follow up appointment with care management team member scheduled for:  04/18/2022 at 3:00 PM  Junius Argyle, PharmD, BCACP, CPP Clinical Pharmacist Practitioner  [facility]  [phone]  Hyperlipidemia: (LDL goal < 70) -Uncontrolled -left common femoral endarterectomy Jan 2023 -Current treatment: Rosuvastatin 20 mg daily: Appropriate, Query effective -Current treatment: Aspirin 81 mg daily: Appropriate, Effective, Safe, Accessible  -Medications previously tried: NA  -Could consider increasing rosuvastatin to 40 mg daily if LDL remains uncontrolled.   Diabetes (A1c goal <7%) -Controlled -Managed by Dr. Kelton Pillar -Current medications: Novolog 1-4 units three times daily with meals: Appropriate, Query effective  Basaglar 14 units nightly: Appropriate, Query effective  Jardiance 25 mg daily: Appropriate, Query effective  Metformin XR 500 mg twice daily: Appropriate, Query effective  Ozempic 1 mg weekly  -Medications previously tried: Glipizide, Jardiance   -Current home glucose readings: Utilizing Dexcom: 110s  Exrcise patterns: Stairs, swimming  -Hypoglycemia improved overall. Reports hypoglycemic symptoms: 1x weekly. She has her dexcom alarm set for <60, discussed changing settings back to alert if less than 70.  -Continue current medications   Bipolar Disorder (Goal: Maintain symptom remission) -Not ideally controlled -Current treatment: Bupropinon 75 mg twice daily Duloxetine 60 mg nightly -Medications previously tried/failed: Alprazolam, amitriptyline, fluoxetine, quetiapine, zolpidem,  -PHQ9: 1 -GAD7: 2 -Declines psychiatry/therapy referral   -Recommended to continue current medication  Chronic Pain (Goal: Minimize symptoms) -Uncontrolled -Current treatment  Duloxetine 60 mg twice daily: Appropriate, Query effective Gabapentin 300 mg three times daily: Appropriate, Query effective Voltaren 1% gel  -  Medications previously tried: NA   -Patient reports continued frustration with her pain control. Finds gabapentin ineffective. Sometimes skips medication due to concerns with pain tolerance. Also uses OTC ibuprofen and acetaminophen.   Junius Argyle, PharmD, Para March, CPP Clinical Pharmacist Practitioner  [facility]  [phone]

## 2022-04-19 ENCOUNTER — Other Ambulatory Visit: Payer: Self-pay | Admitting: Nurse Practitioner

## 2022-04-19 DIAGNOSIS — E1140 Type 2 diabetes mellitus with diabetic neuropathy, unspecified: Secondary | ICD-10-CM

## 2022-04-19 DIAGNOSIS — G2581 Restless legs syndrome: Secondary | ICD-10-CM

## 2022-04-24 ENCOUNTER — Telehealth: Payer: Self-pay

## 2022-04-24 NOTE — Telephone Encounter (Signed)
Pt called into office requesting a pain med refill. Patient reports for the past 3 days her LLE has been very painful. Patient states she is still currently taking Pletal and takes tylenol for pain but hasn't helped. I advised at this time we will not be able to sent in pain meds but she is able to try alternating Ibuprofen and tylenol every 4-6 hours, along with alternating heat and ice compresses. Or we can schedule her to be seen sooner than May. Patient states she would like to try alternating ibuprofen and tylenol and would call back if the pains continue for a sooner appt.

## 2022-04-26 DIAGNOSIS — E1165 Type 2 diabetes mellitus with hyperglycemia: Secondary | ICD-10-CM | POA: Diagnosis not present

## 2022-04-26 DIAGNOSIS — E1142 Type 2 diabetes mellitus with diabetic polyneuropathy: Secondary | ICD-10-CM | POA: Diagnosis not present

## 2022-05-05 ENCOUNTER — Other Ambulatory Visit: Payer: Self-pay | Admitting: Nurse Practitioner

## 2022-05-05 DIAGNOSIS — G2581 Restless legs syndrome: Secondary | ICD-10-CM

## 2022-05-05 DIAGNOSIS — E1140 Type 2 diabetes mellitus with diabetic neuropathy, unspecified: Secondary | ICD-10-CM

## 2022-05-06 ENCOUNTER — Telehealth: Payer: Self-pay | Admitting: Nurse Practitioner

## 2022-05-06 NOTE — Telephone Encounter (Signed)
Call and scheduled appointment for 4/23 @ 9:20a

## 2022-05-06 NOTE — Telephone Encounter (Signed)
Prescription Request  05/06/2022  LOV: 08/27/2021  What is the name of the medication or equipment? gabapentin   Have you contacted your pharmacy to request a refill? Yes   Which pharmacy would you like this sent to?  [facility] [phone] - [place], [place] - [address] AT [facility] [address] [place] [address] Phone: [phone] Fax: [phone]   Patient notified that their request is being sent to the clinical staff for review and that they should receive a response within 2 business days.   Please advise at Mobile [phone] (mobile)

## 2022-05-07 ENCOUNTER — Encounter: Payer: Self-pay | Admitting: Nurse Practitioner

## 2022-05-07 ENCOUNTER — Ambulatory Visit (INDEPENDENT_AMBULATORY_CARE_PROVIDER_SITE_OTHER): Payer: Medicare HMO | Admitting: Nurse Practitioner

## 2022-05-07 VITALS — BP 118/80 | HR 83 | Temp 97.7°F | Resp 16 | Ht 61.0 in | Wt 192.0 lb

## 2022-05-07 DIAGNOSIS — E782 Mixed hyperlipidemia: Secondary | ICD-10-CM

## 2022-05-07 DIAGNOSIS — F3175 Bipolar disorder, in partial remission, most recent episode depressed: Secondary | ICD-10-CM

## 2022-05-07 DIAGNOSIS — E1169 Type 2 diabetes mellitus with other specified complication: Secondary | ICD-10-CM

## 2022-05-07 DIAGNOSIS — I739 Peripheral vascular disease, unspecified: Secondary | ICD-10-CM

## 2022-05-07 DIAGNOSIS — Z6836 Body mass index (BMI) 36.0-36.9, adult: Secondary | ICD-10-CM

## 2022-05-07 DIAGNOSIS — E1149 Type 2 diabetes mellitus with other diabetic neurological complication: Secondary | ICD-10-CM | POA: Diagnosis not present

## 2022-05-07 DIAGNOSIS — F3178 Bipolar disorder, in full remission, most recent episode mixed: Secondary | ICD-10-CM

## 2022-05-07 DIAGNOSIS — Z794 Long term (current) use of insulin: Secondary | ICD-10-CM

## 2022-05-07 DIAGNOSIS — E785 Hyperlipidemia, unspecified: Secondary | ICD-10-CM | POA: Diagnosis not present

## 2022-05-07 DIAGNOSIS — E1140 Type 2 diabetes mellitus with diabetic neuropathy, unspecified: Secondary | ICD-10-CM

## 2022-05-07 MED ORDER — ROSUVASTATIN CALCIUM 40 MG PO TABS: 40.0000 mg | ORAL_TABLET | Freq: Every day | ORAL | 3 refills | Status: DC

## 2022-05-07 MED ORDER — GABAPENTIN 300 MG PO CAPS: 300.0000 mg | ORAL_CAPSULE | Freq: Three times a day (TID) | ORAL | 3 refills | Status: DC

## 2022-05-07 NOTE — Assessment & Plan Note (Signed)
Check UACr today Under the care of endocrinology

## 2022-05-07 NOTE — Assessment & Plan Note (Signed)
Encouraged to maintain heart healthy diet and daily exercise Wt Readings from Last 3 Encounters:  05/07/22 192 lb (87.1 kg)  03/25/22 195 lb (88.5 kg)  03/12/22 192 lb (87.1 kg)

## 2022-05-07 NOTE — Patient Instructions (Signed)
Go to lab Med refill sent Please ensure are are taking pletal and f/up with [facility] (Vascular surgeon) as scheduled

## 2022-05-07 NOTE — Assessment & Plan Note (Signed)
Stable mood with wellbutrin and cymbalta

## 2022-05-07 NOTE — Assessment & Plan Note (Addendum)
Repeat lipid panel LDL not at goal: Increase crestor to  Hx of PAD and aortic artherosclerosis

## 2022-05-07 NOTE — Assessment & Plan Note (Addendum)
Never took lyrica as previous prescribed. She opted to maintain gabapentin  TID at this time. Med refill sent

## 2022-05-07 NOTE — Assessment & Plan Note (Addendum)
Intermittent left foot claudication and cyanosis of left great and 2nd toes. Under the care of Dr. Chestine Spore (Vascular surgeon) Current use of Pletal BID, ASA , crestor  Former tobacco use Current daily marijuana use Last ABI 02/2022:Left: 50-74% stenosis noted in the iliac segment. 50-74% stenosis noted in  the common femoral artery. 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

## 2022-05-07 NOTE — Progress Notes (Signed)
Established Patient Visit  Patient: Autumn Valenzuela   DOB: 03/06/1960   62 y.o. Female  MRN: [id number] Visit Date: 05/07/2022  Subjective:    Chief Complaint  Patient presents with   Medication Refill    Gabapentin     PAD (peripheral artery disease) Intermittent left foot claudication and cyanosis of left great and 2nd toes. Under the care of Dr. Chestine Spore (Vascular surgeon) Current use of Pletal BID, ASA , crestor  Former tobacco use Current daily marijuana use Last ABI 02/2022:Left: 50-74% stenosis noted in the iliac segment. 50-74% stenosis noted in  the common femoral artery. 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  Diabetic neuropathy with neurologic complication (HCC) Never took lyrica as previous prescribed. She opted to maintain gabapentin  TID at this time. Med refill sent  DM (diabetes mellitus) (HCC) Check UACr today Under the care of endocrinology  Obesity Encouraged to maintain heart healthy diet and daily exercise Wt Readings from Last 3 Encounters:  05/07/22 192 lb (87.1 kg)  03/25/22 195 lb (88.5 kg)  03/12/22 192 lb (87.1 kg)     Bipolar disorder (HCC) Stable mood with wellbutrin and cymbalta  Hyperlipidemia associated with type 2 diabetes mellitus Repeat lipid panel LDL not at goal: Increase crestor to  Hx of PAD and aortic artherosclerosis  Reviewed medical, surgical, and social history today  Medications: Outpatient Medications Prior to Visit  Medication Sig   Accu-Chek FastClix Lancets MISC TEST 4 TIMES DAILY. DX E11.9   albuterol (PROVENTIL) (2.5 MG/3ML) 0.083% nebulizer solution Take 2.5 mg by nebulization every 6 (six) hours as needed for wheezing.   albuterol (VENTOLIN HFA) 108 (90 Base) MCG/ACT inhaler Inhale 2 puffs into the lungs daily as needed for wheezing or shortness of breath.   aspirin EC 81 MG EC tablet Take 1 tablet (81 mg total) by mouth daily at 6 (six) AM. Swallow whole.   Blood Glucose  Calibration (ACCU-CHEK GUIDE CONTROL) LIQD 1 each by In Vitro route every 30 (thirty) days. Needs Accu-check guide monitor system. DX. E11.9 (Patient taking differently: 1 each by In Vitro route every 30 (thirty) days. Needs Accu-check guide monitor system. DX. E11.9 2 times daily)   blood glucose meter kit and supplies Dispense based on patient and insurance preference. Use up to four times daily as directed. (FOR ICD-10 E10.9, E11.9).   buPROPion (WELLBUTRIN) 75 MG tablet Take 1 tablet (75 mg total) by mouth 2 (two) times daily.   Cholecalciferol (VITAMIN D3) 1.25 MG (50000 UT) CAPS Take 1 capsule by mouth once a week.   cilostazol (PLETAL) 100 MG tablet Take 1 tablet (100 mg total) by mouth 2 (two) times daily before a meal.   diclofenac Sodium (VOLTAREN) 1 % GEL Apply 2 g topically 4 (four) times daily. (Patient taking differently: Apply 2 g topically 4 (four) times daily as needed (pain).)   DULoxetine (CYMBALTA) 60 MG capsule Take 1 capsule (60 mg total) by mouth 2 (two) times daily.   empagliflozin (JARDIANCE) 25 MG TABS tablet Take 1 tablet (25 mg total) by mouth daily.   ferrous sulfate 325 (65 FE) MG tablet Take 1 tablet (325 mg total) by mouth every other day. (Patient taking differently: Take 325 mg by mouth daily.)   fluticasone (FLONASE) 50 MCG/ACT nasal spray Place 2 sprays into both nostrils daily as needed for allergies.   glucose blood (ONETOUCH VERIO) test strip CHECK UPTO FOUR TIMES DAILY AS  DIRECTED   insulin aspart (NOVOLOG FLEXPEN) 100 UNIT/ML FlexPen Max daily 30 units (Patient taking differently: Inject 1-4 Units into the skin 3 (three) times daily as needed (blood sugar over 150). Max daily 30 units)   Insulin Glargine (BASAGLAR KWIKPEN) 100 UNIT/ML Inject 14 Units into the skin daily.   Insulin Pen Needle 31G X 5 MM MISC 1 Units by Does not apply route in the morning, at noon, in the evening, and at bedtime.   metFORMIN (GLUCOPHAGE-XR) 500 MG 24 hr tablet Take 1 tablet (500  mg total) by mouth in the morning and at bedtime.   Semaglutide, 1 MG/DOSE, 4 MG/3ML SOPN Inject 1 mg as directed once a week.   tiZANidine (ZANAFLEX) 4 MG tablet Take 1 tablet (4 mg total) by mouth every 6 (six) hours as needed for muscle spasms.   traMADol (ULTRAM) 50 MG tablet Take by mouth.   [DISCONTINUED] gabapentin (NEURONTIN) 300 MG capsule Take 300 mg by mouth 3 (three) times daily.   [DISCONTINUED] pregabalin (LYRICA) 75 MG capsule Take 1 capsule (75 mg total) by mouth 2 (two) times daily.   [DISCONTINUED] rosuvastatin (CRESTOR) 20 MG tablet TAKE 1 TABLET(20 MG) BY MOUTH DAILY   [DISCONTINUED] HYDROcodone-acetaminophen (NORCO/VICODIN) 5-325 MG tablet Take 2 tablets by mouth every 6 (six) hours as needed.   No facility-administered medications prior to visit.   Reviewed past medical and social history.   ROS per HPI above      Objective:  BP 118/80 (BP Location: Left Arm, Patient Position: Sitting, Cuff Size: Large)   Pulse 83   Temp 97.7 F (36.5 C) (Temporal)   Resp 16   Ht 5\' 1"  (1.549 m)   Wt 192 lb (87.1 kg)   SpO2 97%   BMI 36.28 kg/m      Physical Exam Cardiovascular:     Pulses:          Dorsalis pedis pulses are 2+ on the right side and 0 on the left side.       Posterior tibial pulses are 2+ on the right side and 0 on the left side.  Musculoskeletal:     Right foot: Normal range of motion.     Left foot: Normal range of motion.  Feet:     Right foot:     Protective Sensation: 6 sites tested.  6 sites sensed.     Skin integrity: Skin integrity normal.     Toenail Condition: Right toenails are abnormally thick.     Left foot:     Protective Sensation: 6 sites tested.  6 sites sensed.     Skin integrity: Dry skin present. No ulcer, blister, skin breakdown, erythema, warmth, callus or fissure.     Toenail Condition: Left toenails are abnormally thick.     Comments: Dusky skin on left great toe and 2nd toe    No results found for any visits on  05/07/22.    Assessment & Plan:    Problem List Items Addressed This Visit       Cardiovascular and Mediastinum   PAD (peripheral artery disease)    Intermittent left foot claudication and cyanosis of left great and 2nd toes. Under the care of Dr. Chestine Spore (Vascular surgeon) Current use of Pletal BID, ASA 81mg , crestor 20mg  Former tobacco use Current daily marijuana use Last ABI 02/2022:Left: 50-74% stenosis noted in the iliac segment. 50-74% stenosis noted in  the common femoral artery. 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      Relevant  Medications   rosuvastatin (CRESTOR) 40 MG tablet     Endocrine   Diabetic neuropathy with neurologic complication    Never took lyrica as previous prescribed. She opted to maintain gabapentin  TID at this time. Med refill sent      Relevant Medications   gabapentin (NEURONTIN) 300 MG capsule   rosuvastatin (CRESTOR) 40 MG tablet   DM (diabetes mellitus) - Primary    Check UACr today Under the care of endocrinology      Relevant Medications   rosuvastatin (CRESTOR) 40 MG tablet   Other Relevant Orders   Urine microalbumin-creatinine with uACR   Comprehensive metabolic panel   Hyperlipidemia associated with type 2 diabetes mellitus    Repeat lipid panel LDL not at goal: Increase crestor to  Hx of PAD and aortic artherosclerosis      Relevant Medications   rosuvastatin (CRESTOR) 40 MG tablet   Other Relevant Orders   Comprehensive metabolic panel   Direct LDL     Other   Bipolar disorder    Stable mood with wellbutrin and cymbalta      Obesity    Encouraged to maintain heart healthy diet and daily exercise Wt Readings from Last 3 Encounters:  05/07/22 192 lb (87.1 kg)  03/25/22 195 lb (88.5 kg)  03/12/22 192 lb (87.1 kg)         Other Visit Diagnoses     Mixed hyperlipidemia       Relevant Medications   rosuvastatin (CRESTOR) 40 MG tablet      Return in about 6 months (around 11/06/2022) for HTN,  DM, hyperlipidemia (fasting).     Alysia Penna, NP

## 2022-05-09 ENCOUNTER — Telehealth: Payer: Self-pay | Admitting: Nurse Practitioner

## 2022-05-09 IMAGING — US US PELVIS LIMITED
1 series · 14 of 19 positions shown · non-contrast
Comparison: CT abdomen and pelvis 03/15/2021

CLINICAL DATA: Evaluate left inguinal lymphadenopathy.

EXAM:
US PELVIS LIMITED
TECHNIQUE: Grayscale sonographic imaging of the left inguinal region.

[Series 1: us pelvis limited · 0.05mm/px · 14 of 19 slices shown]
[im 1/19]
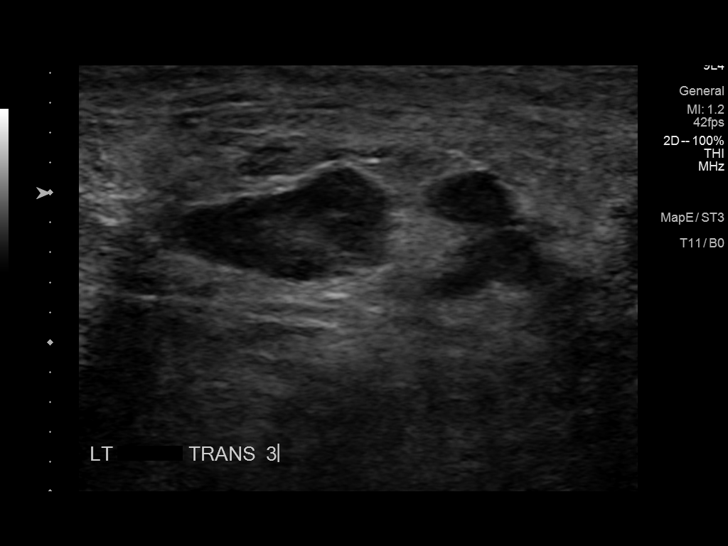
[im 3/19]
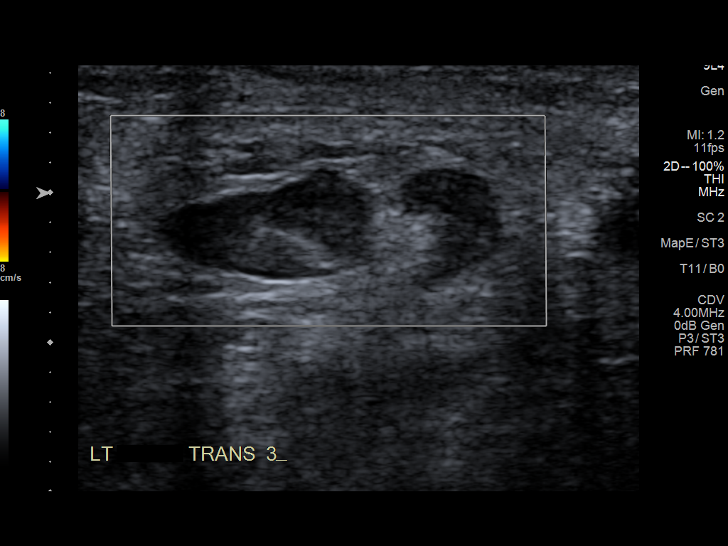
[im 4/19]
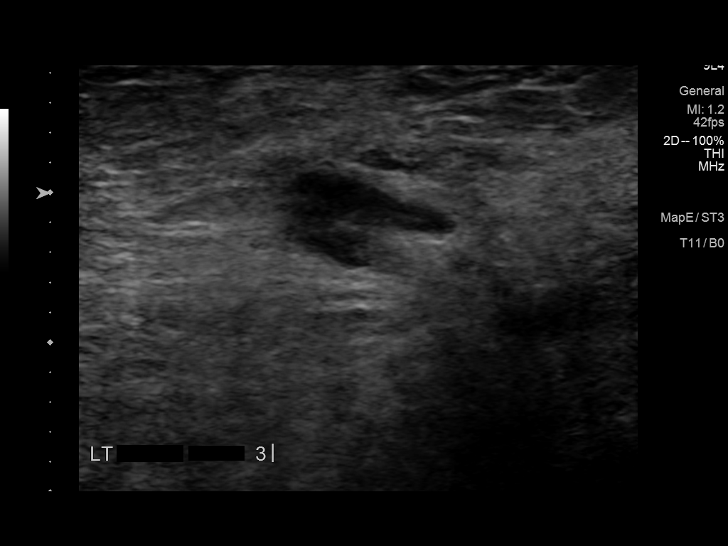
[im 5/19]
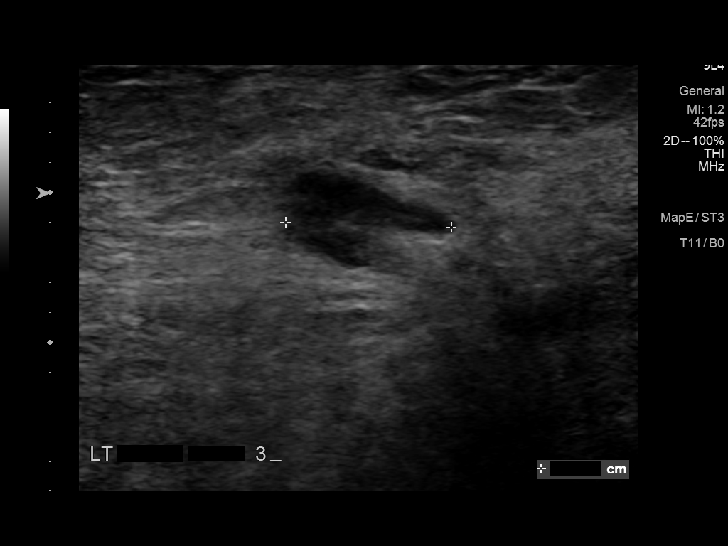
[im 7/19]
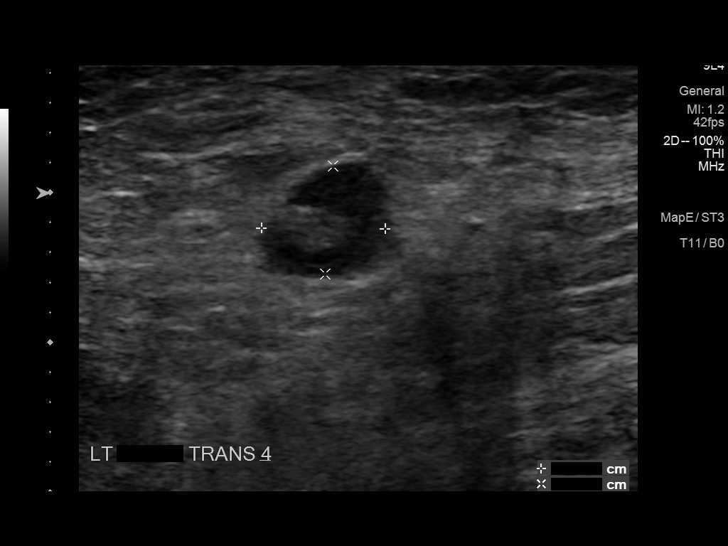
[im 8/19]
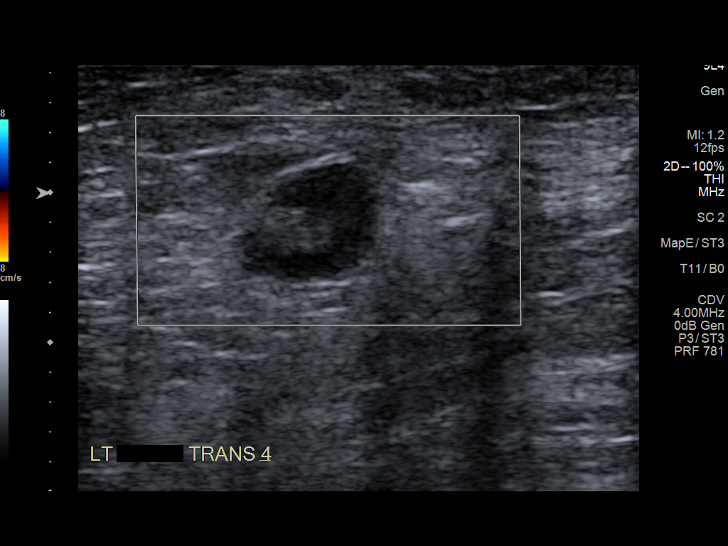
[im 9/19]
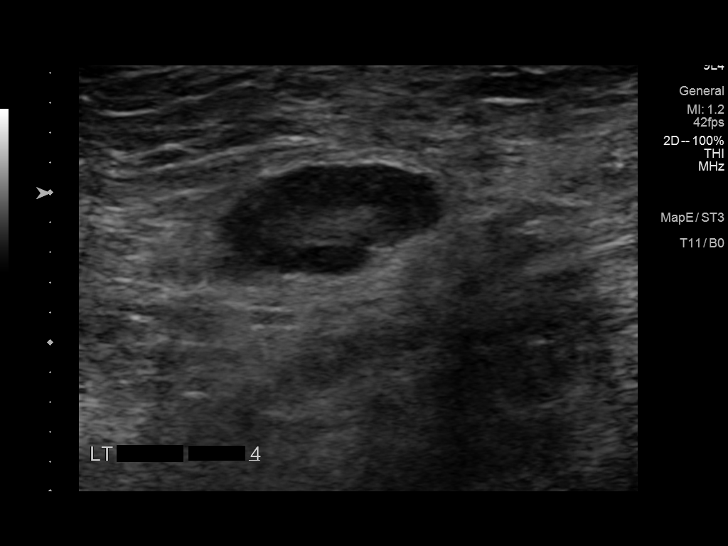
[im 11/19]
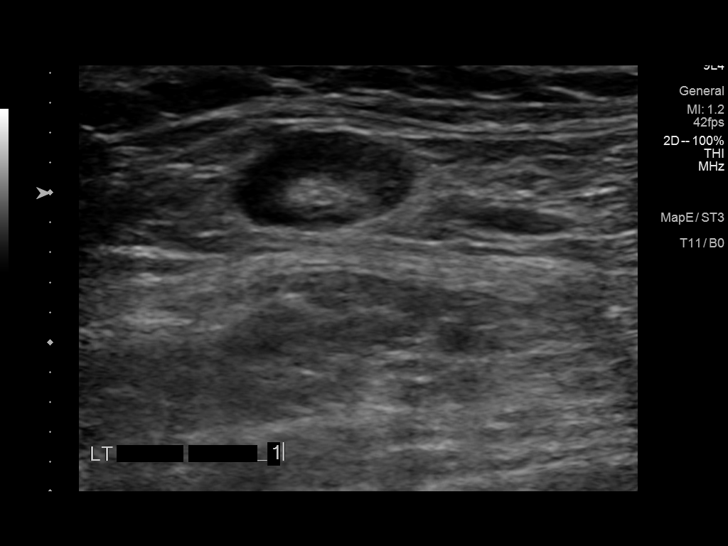
[im 12/19]
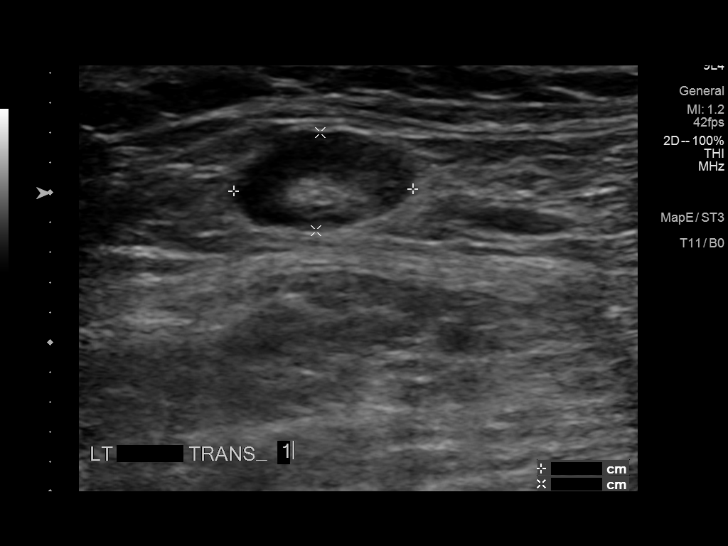
[im 13/19]
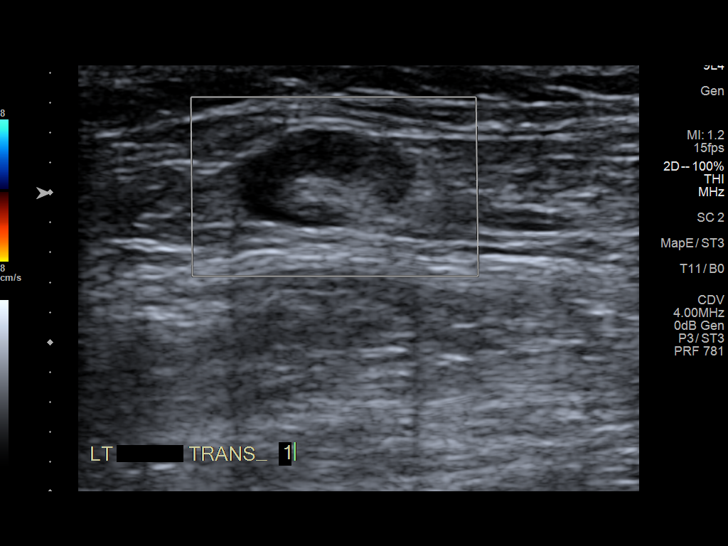
[im 15/19]
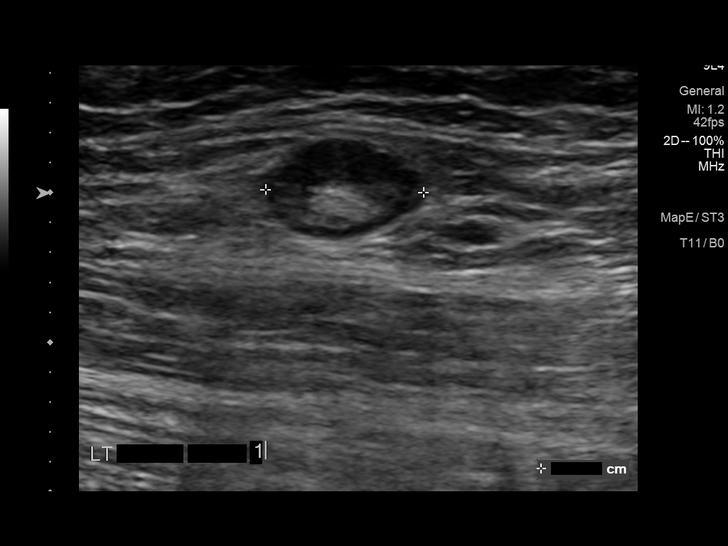
[im 16/19]
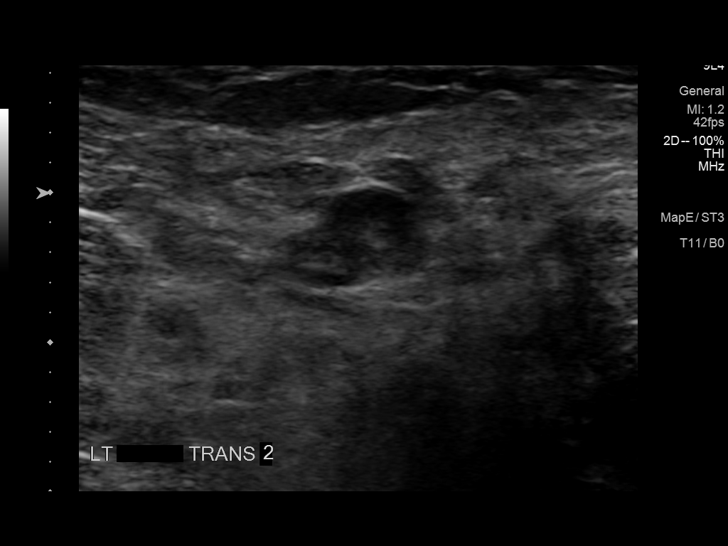
[im 17/19]
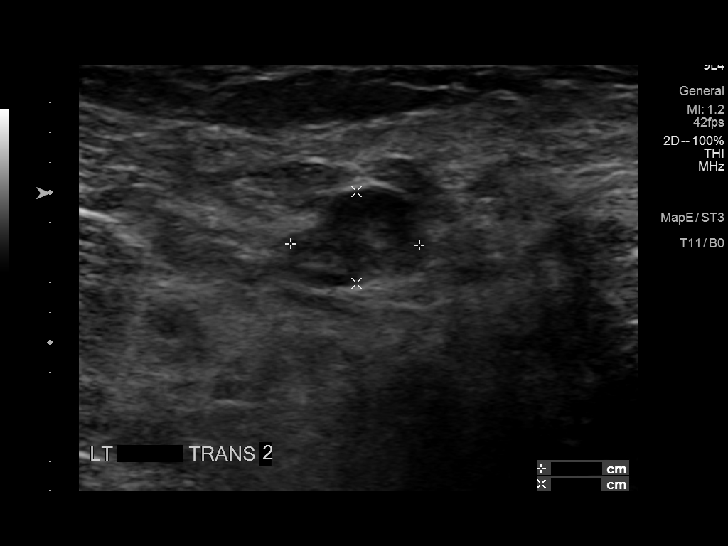
[im 19/19]
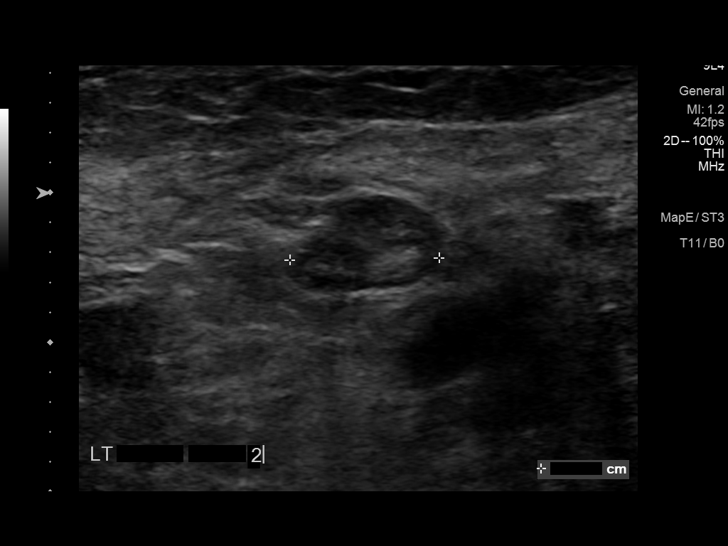

[14 of 19 positions shown; findings below may reference images not displayed]

FINDINGS: There are multiple prominent lymph nodes in the left inguinal region
containing fatty hilum. The largest lymph node measures 7 mm short
axis. No fluid collections are identified.
IMPRESSION: 1. Prominent left inguinal lymph nodes. These measure less than 1 cm
short axis.

## 2022-05-09 NOTE — Telephone Encounter (Signed)
error 

## 2022-05-09 NOTE — Telephone Encounter (Signed)
Called patient back and left Charlotte's instructions.

## 2022-05-09 NOTE — Telephone Encounter (Signed)
Pt called and said she do not have them kind of pills you asked about

## 2022-05-12 ENCOUNTER — Other Ambulatory Visit: Payer: Self-pay | Admitting: Nurse Practitioner

## 2022-05-12 DIAGNOSIS — F3175 Bipolar disorder, in partial remission, most recent episode depressed: Secondary | ICD-10-CM

## 2022-06-11 ENCOUNTER — Other Ambulatory Visit: Payer: Self-pay

## 2022-06-11 ENCOUNTER — Ambulatory Visit (INDEPENDENT_AMBULATORY_CARE_PROVIDER_SITE_OTHER): Payer: Medicare HMO | Admitting: Vascular Surgery

## 2022-06-11 ENCOUNTER — Encounter: Payer: Self-pay | Admitting: Vascular Surgery

## 2022-06-11 ENCOUNTER — Other Ambulatory Visit (INDEPENDENT_AMBULATORY_CARE_PROVIDER_SITE_OTHER): Payer: Medicare HMO

## 2022-06-11 VITALS — BP 115/84 | HR 95 | Temp 97.1°F | Resp 16 | Ht 61.0 in | Wt 187.0 lb

## 2022-06-11 DIAGNOSIS — E785 Hyperlipidemia, unspecified: Secondary | ICD-10-CM | POA: Diagnosis not present

## 2022-06-11 DIAGNOSIS — E1169 Type 2 diabetes mellitus with other specified complication: Secondary | ICD-10-CM | POA: Diagnosis not present

## 2022-06-11 DIAGNOSIS — Z794 Long term (current) use of insulin: Secondary | ICD-10-CM

## 2022-06-11 DIAGNOSIS — I70212 Atherosclerosis of native arteries of extremities with intermittent claudication, left leg: Secondary | ICD-10-CM

## 2022-06-11 DIAGNOSIS — I70219 Atherosclerosis of native arteries of extremities with intermittent claudication, unspecified extremity: Secondary | ICD-10-CM | POA: Insufficient documentation

## 2022-06-11 LAB — COMPREHENSIVE METABOLIC PANEL
ALT: 6 U/L (ref 0–35)
AST: 11 U/L (ref 0–37)
Albumin: 4 g/dL (ref 3.5–5.2)
Alkaline Phosphatase: 48 U/L (ref 39–117)
BUN: 9 mg/dL (ref 6–23)
CO2: 26 mEq/L (ref 19–32)
Calcium: 9.2 mg/dL (ref 8.4–10.5)
Chloride: 107 mEq/L (ref 96–112)
Creatinine, Ser: 0.8 mg/dL (ref 0.40–1.20)
GFR: 80.46 mL/min (ref >60.00)
Glucose, Bld: 82 mg/dL (ref 70–99)
Potassium: 4.4 mEq/L (ref 3.5–5.1)
Sodium: 141 mEq/L (ref 135–145)
Total Bilirubin: 0.3 mg/dL (ref 0.2–1.2)
Total Protein: 7 g/dL (ref 6.0–8.3)

## 2022-06-11 LAB — LDL CHOLESTEROL, DIRECT: Direct LDL: 73 mg/dL

## 2022-06-11 NOTE — Progress Notes (Signed)
Patient name: Autumn Valenzuela MRN: [id number] DOB: Aug 18, 1960 Sex: female  REASON FOR VISIT: 3 month follow-up for surveillance, previous left common femoral endarterectomy for claudication  HPI: Autumn Valenzuela is a 62 y.o. female with hx HTN, HLD, DM, PAD that presents for 3 month follow-up for left leg claudication.  She previously underwent a left common femoral endarterectomy with bovine patch on 02/12/2021 for lifestyle limiting claudication and did well after surgery.  She was seen 3 months ago and felt her claudication was returning in the left leg.  States now she really cannot go upstairs in the gym and walk around the track like she could before.  Was previously doing about 10 laps.  She describes cramping in the thigh and calf in the left leg.  She quit smoking 20 years ago.  Past Medical History:  Diagnosis Date   Allergy    Anemia    Anxiety    Arthritis    Asthma    Back pain    Bipolar disorder (HCC)    CVA (cerebral infarction)    Depression    Diabetes mellitus without complication (HCC)    High cholesterol    Hypertension    Left leg pain    Migraine    Sleep apnea    does not use a cpap   Stroke (HCC) 2007   no lasting weakness    Past Surgical History:  Procedure Laterality Date   ABDOMINAL AORTOGRAM W/LOWER EXTREMITY N/A 02/08/2021   Procedure: ABDOMINAL AORTOGRAM W/LOWER EXTREMITY;  Surgeon: Cephus Shelling, MD;  Location: [facility];  Service: Cardiovascular;  Laterality: N/A;   ABDOMINAL HYSTERECTOMY     ANTERIOR LATERAL LUMBAR FUSION WITH PERCUTANEOUS SCREW 1 LEVEL Left 02/11/2018   Procedure: LEFT LATERAL LUMBAR  FOUR-FIVE INTERBODY FUSION WITH INSTRUMENTATION AND ALLOGRAFT;  Surgeon: Estill Bamberg, MD;  Location: [facility];  Service: Orthopedics;  Laterality: Left;  LEFT LATERAL LUMBAR  FOUR-FIVE INTERBODY FUSION WITH INSTRUMENTATION AND ALLOGRAFT   BACK SURGERY     Lumbar fusion L5-S1   DIABETES     ENDARTERECTOMY FEMORAL Left 02/12/2021    Procedure: LEFT COMMON FEMORAL ENDARTERECTOMY WITH BOVINE PATCH;  Surgeon: Cephus Shelling, MD;  Location: [facility];  Service: Vascular;  Laterality: Left;   EYE SURGERY Bilateral    INGUINAL LYMPH NODE BIOPSY Left 07/25/2021   Procedure: EXCISIONAL BIOPSY LEFT INGUINAL LYMPH NODES;  Surgeon: Abigail Miyamoto, MD;  Location: [facility];  Service: General;  Laterality: Left;   KNEE SURGERY     l 4-l5 status post lateral posterior fusion January 30,2020     NEUROFORAMINAL STENOSIS Left    Severe L4-L5, involving the level above previous fusion.   PARTIAL HYSTERECTOMY     RADICULOPATHY Left    L4, Secondary to an L4-L5 Spondylolisthesis   SHOULDER SURGERY      Family History  Problem Relation Age of Onset   Diabetes Mother    Stroke Father    Cancer Paternal Aunt    Cancer Paternal Aunt    Diabetes Brother    Colon cancer Neg Hx    Esophageal cancer Neg Hx    Rectal cancer Neg Hx    Stomach cancer Neg Hx     SOCIAL HISTORY: Social History   Tobacco Use   Smoking status: Former    Types: Cigarettes    Quit date: 12/13/2015    Years since quitting: 6.4   Smokeless tobacco: Never  Substance Use Topics  Alcohol use: Not Currently    Comment: Occas    No Known Allergies  Current Outpatient Medications  Medication Sig Dispense Refill   Accu-Chek FastClix Lancets MISC TEST 4 TIMES DAILY. DX E11.9 306 each 3   albuterol (PROVENTIL) (2.5 MG/3ML) 0.083% nebulizer solution Take 2.5 mg by nebulization every 6 (six) hours as needed for wheezing.     albuterol (VENTOLIN HFA) 108 (90 Base) MCG/ACT inhaler Inhale 2 puffs into the lungs daily as needed for wheezing or shortness of breath. 18 g 2   aspirin EC 81 MG EC tablet Take 1 tablet (81 mg total) by mouth daily at 6 (six) AM. Swallow whole. 30 tablet 11   Blood Glucose Calibration (ACCU-CHEK GUIDE CONTROL) LIQD 1 each by In Vitro route every 30 (thirty) days. Needs Accu-check guide monitor system. DX. E11.9  (Patient taking differently: 1 each by In Vitro route every 30 (thirty) days. Needs Accu-check guide monitor system. DX. E11.9 2 times daily) 3 each 3   blood glucose meter kit and supplies Dispense based on patient and insurance preference. Use up to four times daily as directed. (FOR ICD-10 E10.9, E11.9). 1 each 0   buPROPion (WELLBUTRIN) 75 MG tablet TAKE 1 TABLET(75 MG) BY MOUTH TWICE DAILY 180 tablet 3   Cholecalciferol (VITAMIN D3) 1.25 MG (50000 UT) CAPS Take 1 capsule by mouth once a week. 4 capsule 0   cilostazol (PLETAL) 100 MG tablet Take 1 tablet (100 mg total) by mouth 2 (two) times daily before a meal. 60 tablet 11   diclofenac Sodium (VOLTAREN) 1 % GEL Apply 2 g topically 4 (four) times daily. (Patient taking differently: Apply 2 g topically 4 (four) times daily as needed (pain).) 100 g 1   DULoxetine (CYMBALTA) 60 MG capsule Take 1 capsule (60 mg total) by mouth 2 (two) times daily. 180 capsule 3   empagliflozin (JARDIANCE) 25 MG TABS tablet Take 1 tablet (25 mg total) by mouth daily. 90 tablet 3   ferrous sulfate 325 (65 FE) MG tablet Take 1 tablet (325 mg total) by mouth every other day. (Patient taking differently: Take 325 mg by mouth daily.) 45 tablet 3   fluticasone (FLONASE) 50 MCG/ACT nasal spray Place 2 sprays into both nostrils daily as needed for allergies. 16 g 5   gabapentin (NEURONTIN) 300 MG capsule Take 1 capsule (300 mg total) by mouth 3 (three) times daily. 270 capsule 3   glucose blood (ONETOUCH VERIO) test strip CHECK UPTO FOUR TIMES DAILY AS DIRECTED 100 strip 5   insulin aspart (NOVOLOG FLEXPEN) 100 UNIT/ML FlexPen Max daily 30 units (Patient taking differently: Inject 1-4 Units into the skin 3 (three) times daily as needed (blood sugar over 150). Max daily 30 units) 15 mL 6   Insulin Glargine (BASAGLAR KWIKPEN) 100 UNIT/ML Inject 14 Units into the skin daily. 15 mL 11   Insulin Pen Needle 31G X 5 MM MISC 1 Units by Does not apply route in the morning, at noon, in  the evening, and at bedtime. 400 each 3   metFORMIN (GLUCOPHAGE-XR) 500 MG 24 hr tablet Take 1 tablet (500 mg total) by mouth in the morning and at bedtime. 180 tablet 3   rosuvastatin (CRESTOR) 40 MG tablet Take 1 tablet (40 mg total) by mouth at bedtime. 90 tablet 3   Semaglutide, 1 MG/DOSE, 4 MG/3ML SOPN Inject 1 mg as directed once a week. 9 mL 3   tiZANidine (ZANAFLEX) 4 MG tablet Take 1 tablet (4 mg total)  by mouth every 6 (six) hours as needed for muscle spasms. 30 tablet 0   traMADol (ULTRAM) 50 MG tablet Take by mouth.     No current facility-administered medications for this visit.    REVIEW OF SYSTEMS:  [X]  denotes positive finding, [ ]  denotes negative finding Cardiac  Comments:  Chest pain or chest pressure:    Shortness of breath upon exertion:    Short of breath when lying flat:    Irregular heart rhythm:        Vascular    Pain in calf, thigh, or hip brought on by ambulation: x Left calf/thigh  Pain in feet at night that wakes you up from your sleep:     Blood clot in your veins:    Leg swelling:         Pulmonary    Oxygen at home:    Productive cough:     Wheezing:         Neurologic    Sudden weakness in arms or legs:     Sudden numbness in arms or legs:     Sudden onset of difficulty speaking or slurred speech:    Temporary loss of vision in one eye:     Problems with dizziness:         Gastrointestinal    Blood in stool:     Vomited blood:         Genitourinary    Burning when urinating:     Blood in urine:        Psychiatric    Major depression:         Hematologic    Bleeding problems:    Problems with blood clotting too easily:        Skin    Rashes or ulcers:        Constitutional    Fever or chills:      PHYSICAL EXAM: There were no vitals filed for this visit.   GENERAL: The patient is a well-nourished female, in no acute distress. The vital signs are documented above. CARDIAC: There is a regular rate and rhythm.  VASCULAR:   Bilateral femoral pulses palpable Brisk left DP Doppler signal  DATA:   Left lower extremity arterial duplex 03/06/22 shows a 50 to 74% stenosis in the iliac segment as well as the common femoral artery.  ABI 2/211/24 1.12 biphasic right and 1.0 biphasic left   Assessment/Plan:  62 y.o. female with hx HTN, HLD, DM, PAD that presents for 3 month follow-up for recurrent left leg claudication.  She previously underwent a left common femoral endarterectomy with bovine patch on 02/12/2021 for lifestyle limiting claudication.    Her most recent duplex about 3 months ago showed some recurrent stenosis in the external iliac and common femoral artery.  Her ABI at that time was normal.  She does not feel she can tolerate her symptoms.  She cannot work out at [employer] and finds it difficult to take care of her grandchildren.  I have recommended a CTA aorta bifemoral runoff to evaluate if she has significant restenosis in the patch versus other lesion that would be amendable to endovascular treatment.  Cephus Shelling, MD [facility] Office: [phone]

## 2022-06-12 DIAGNOSIS — Z008 Encounter for other general examination: Secondary | ICD-10-CM | POA: Diagnosis not present

## 2022-06-12 DIAGNOSIS — E1142 Type 2 diabetes mellitus with diabetic polyneuropathy: Secondary | ICD-10-CM | POA: Diagnosis not present

## 2022-06-12 DIAGNOSIS — E785 Hyperlipidemia, unspecified: Secondary | ICD-10-CM | POA: Diagnosis not present

## 2022-06-12 DIAGNOSIS — G2581 Restless legs syndrome: Secondary | ICD-10-CM | POA: Diagnosis not present

## 2022-06-12 DIAGNOSIS — M1990 Unspecified osteoarthritis, unspecified site: Secondary | ICD-10-CM | POA: Diagnosis not present

## 2022-06-12 DIAGNOSIS — I129 Hypertensive chronic kidney disease with stage 1 through stage 4 chronic kidney disease, or unspecified chronic kidney disease: Secondary | ICD-10-CM | POA: Diagnosis not present

## 2022-06-12 DIAGNOSIS — E669 Obesity, unspecified: Secondary | ICD-10-CM | POA: Diagnosis not present

## 2022-06-12 DIAGNOSIS — M4800 Spinal stenosis, site unspecified: Secondary | ICD-10-CM | POA: Diagnosis not present

## 2022-06-12 DIAGNOSIS — K5900 Constipation, unspecified: Secondary | ICD-10-CM | POA: Diagnosis not present

## 2022-06-12 DIAGNOSIS — N189 Chronic kidney disease, unspecified: Secondary | ICD-10-CM | POA: Diagnosis not present

## 2022-06-12 DIAGNOSIS — F419 Anxiety disorder, unspecified: Secondary | ICD-10-CM | POA: Diagnosis not present

## 2022-06-12 DIAGNOSIS — R32 Unspecified urinary incontinence: Secondary | ICD-10-CM | POA: Diagnosis not present

## 2022-06-12 DIAGNOSIS — G43909 Migraine, unspecified, not intractable, without status migrainosus: Secondary | ICD-10-CM | POA: Diagnosis not present

## 2022-06-18 ENCOUNTER — Telehealth: Payer: Self-pay | Admitting: Nurse Practitioner

## 2022-06-18 NOTE — Telephone Encounter (Signed)
See note

## 2022-06-19 ENCOUNTER — Other Ambulatory Visit: Payer: Self-pay | Admitting: Nurse Practitioner

## 2022-06-19 DIAGNOSIS — Z1231 Encounter for screening mammogram for malignant neoplasm of breast: Secondary | ICD-10-CM

## 2022-06-19 NOTE — Telephone Encounter (Signed)
She received a letter about the recall with Duloxetine.  She will call her pharmacy to make sure her recent medication refill was affected

## 2022-06-19 NOTE — Progress Notes (Signed)
Stable Follow instructions as discussed during office visit.

## 2022-07-04 ENCOUNTER — Ambulatory Visit (HOSPITAL_COMMUNITY)
Admission: RE | Admit: 2022-07-04 | Discharge: 2022-07-04 | Disposition: A | Discharge status: Home / Self Care | Payer: Medicare HMO | Source: Ambulatory Visit | Attending: Vascular Surgery | Admitting: Vascular Surgery

## 2022-07-04 DIAGNOSIS — I70212 Atherosclerosis of native arteries of extremities with intermittent claudication, left leg: Secondary | ICD-10-CM | POA: Insufficient documentation

## 2022-07-04 DIAGNOSIS — I700 Atherosclerosis of aorta: Secondary | ICD-10-CM | POA: Diagnosis not present

## 2022-07-04 MED ORDER — IOHEXOL 350 MG/ML SOLN
125.0000 mL | Freq: Once | INTRAVENOUS | Status: AC | PRN
  Administered 2022-07-04: 125 mL via INTRAVENOUS

## 2022-07-04 MED ORDER — SODIUM CHLORIDE (PF) 0.9 % IJ SOLN
INTRAMUSCULAR | Status: AC
  Filled 2022-07-04: qty 50

## 2022-07-09 ENCOUNTER — Ambulatory Visit (INDEPENDENT_AMBULATORY_CARE_PROVIDER_SITE_OTHER): Payer: Medicare HMO | Admitting: Vascular Surgery

## 2022-07-09 ENCOUNTER — Encounter: Payer: Self-pay | Admitting: Vascular Surgery

## 2022-07-09 VITALS — BP 98/67 | HR 85 | Temp 97.6°F | Wt 184.0 lb

## 2022-07-09 DIAGNOSIS — I70212 Atherosclerosis of native arteries of extremities with intermittent claudication, left leg: Secondary | ICD-10-CM

## 2022-07-09 DIAGNOSIS — Z9889 Other specified postprocedural states: Secondary | ICD-10-CM | POA: Diagnosis not present

## 2022-07-09 NOTE — Progress Notes (Signed)
Patient name: Autumn Valenzuela MRN: [id number] DOB: 07-13-1960 Sex: female  REASON FOR VISIT: Follow-up after CTA for recurrent left leg claudication in setting of remote left femoral endarterectomy   HPI: Autumn Valenzuela is a 62 y.o. female with hx HTN, HLD, DM, PAD that presents for follow-up for after CTA for evaluation of recurrent left leg claudication.  She previously underwent a left common femoral endarterectomy with bovine patch on 02/12/2021 for lifestyle limiting claudication and did well after surgery.  Had evidence of stenosis in the left iliac as well as the common femoral artery with new symptoms.  I sent her for CTA.  Today she states she still having claudication in her left leg from walking here to her car in the parking lot.  Past Medical History:  Diagnosis Date   Allergy    Anemia    Anxiety    Arthritis    Asthma    Back pain    Bipolar disorder (HCC)    CVA (cerebral infarction)    Depression    Diabetes mellitus without complication (HCC)    High cholesterol    Hypertension    Left leg pain    Migraine    Sleep apnea    does not use a cpap   Stroke (HCC) 2007   no lasting weakness    Past Surgical History:  Procedure Laterality Date   ABDOMINAL AORTOGRAM W/LOWER EXTREMITY N/A 02/08/2021   Procedure: ABDOMINAL AORTOGRAM W/LOWER EXTREMITY;  Surgeon: Cephus Shelling, MD;  Location: [facility];  Service: Cardiovascular;  Laterality: N/A;   ABDOMINAL HYSTERECTOMY     ANTERIOR LATERAL LUMBAR FUSION WITH PERCUTANEOUS SCREW 1 LEVEL Left 02/11/2018   Procedure: LEFT LATERAL LUMBAR  FOUR-FIVE INTERBODY FUSION WITH INSTRUMENTATION AND ALLOGRAFT;  Surgeon: Estill Bamberg, MD;  Location: [facility];  Service: Orthopedics;  Laterality: Left;  LEFT LATERAL LUMBAR  FOUR-FIVE INTERBODY FUSION WITH INSTRUMENTATION AND ALLOGRAFT   BACK SURGERY     Lumbar fusion L5-S1   DIABETES     ENDARTERECTOMY FEMORAL Left 02/12/2021   Procedure: LEFT COMMON FEMORAL ENDARTERECTOMY  WITH BOVINE PATCH;  Surgeon: Cephus Shelling, MD;  Location: [facility];  Service: Vascular;  Laterality: Left;   EYE SURGERY Bilateral    INGUINAL LYMPH NODE BIOPSY Left 07/25/2021   Procedure: EXCISIONAL BIOPSY LEFT INGUINAL LYMPH NODES;  Surgeon: Abigail Miyamoto, MD;  Location: [facility];  Service: General;  Laterality: Left;   KNEE SURGERY     l 4-l5 status post lateral posterior fusion January 30,2020     NEUROFORAMINAL STENOSIS Left    Severe L4-L5, involving the level above previous fusion.   PARTIAL HYSTERECTOMY     RADICULOPATHY Left    L4, Secondary to an L4-L5 Spondylolisthesis   SHOULDER SURGERY      Family History  Problem Relation Age of Onset   Diabetes Mother    Stroke Father    Cancer Paternal Aunt    Cancer Paternal Aunt    Diabetes Brother    Colon cancer Neg Hx    Esophageal cancer Neg Hx    Rectal cancer Neg Hx    Stomach cancer Neg Hx     SOCIAL HISTORY: Social History   Tobacco Use   Smoking status: Former    Types: Cigarettes    Quit date: 12/13/2015    Years since quitting: 6.5   Smokeless tobacco: Never  Substance Use Topics   Alcohol use: Not Currently    Comment:  Occas    No Known Allergies  Current Outpatient Medications  Medication Sig Dispense Refill   Accu-Chek FastClix Lancets MISC TEST 4 TIMES DAILY. DX E11.9 306 each 3   albuterol (PROVENTIL) (2.5 MG/3ML) 0.083% nebulizer solution Take 2.5 mg by nebulization every 6 (six) hours as needed for wheezing.     albuterol (VENTOLIN HFA) 108 (90 Base) MCG/ACT inhaler Inhale 2 puffs into the lungs daily as needed for wheezing or shortness of breath. 18 g 2   aspirin EC 81 MG EC tablet Take 1 tablet (81 mg total) by mouth daily at 6 (six) AM. Swallow whole. 30 tablet 11   Blood Glucose Calibration (ACCU-CHEK GUIDE CONTROL) LIQD 1 each by In Vitro route every 30 (thirty) days. Needs Accu-check guide monitor system. DX. E11.9 (Patient taking differently: 1 each by In Vitro route  every 30 (thirty) days. Needs Accu-check guide monitor system. DX. E11.9 2 times daily) 3 each 3   blood glucose meter kit and supplies Dispense based on patient and insurance preference. Use up to four times daily as directed. (FOR ICD-10 E10.9, E11.9). 1 each 0   buPROPion (WELLBUTRIN) 75 MG tablet TAKE 1 TABLET(75 MG) BY MOUTH TWICE DAILY 180 tablet 3   Cholecalciferol (VITAMIN D3) 1.25 MG (50000 UT) CAPS Take 1 capsule by mouth once a week. 4 capsule 0   cilostazol (PLETAL) 100 MG tablet Take 1 tablet (100 mg total) by mouth 2 (two) times daily before a meal. 60 tablet 11   diclofenac Sodium (VOLTAREN) 1 % GEL Apply 2 g topically 4 (four) times daily. (Patient taking differently: Apply 2 g topically 4 (four) times daily as needed (pain).) 100 g 1   DULoxetine (CYMBALTA) 60 MG capsule Take 1 capsule (60 mg total) by mouth 2 (two) times daily. 180 capsule 3   empagliflozin (JARDIANCE) 25 MG TABS tablet Take 1 tablet (25 mg total) by mouth daily. 90 tablet 3   ferrous sulfate 325 (65 FE) MG tablet Take 1 tablet (325 mg total) by mouth every other day. (Patient taking differently: Take 325 mg by mouth daily.) 45 tablet 3   fluticasone (FLONASE) 50 MCG/ACT nasal spray Place 2 sprays into both nostrils daily as needed for allergies. 16 g 5   gabapentin (NEURONTIN) 300 MG capsule Take 1 capsule (300 mg total) by mouth 3 (three) times daily. 270 capsule 3   glucose blood (ONETOUCH VERIO) test strip CHECK UPTO FOUR TIMES DAILY AS DIRECTED 100 strip 5   insulin aspart (NOVOLOG FLEXPEN) 100 UNIT/ML FlexPen Max daily 30 units (Patient taking differently: Inject 1-4 Units into the skin 3 (three) times daily as needed (blood sugar over 150). Max daily 30 units) 15 mL 6   Insulin Glargine (BASAGLAR KWIKPEN) 100 UNIT/ML Inject 14 Units into the skin daily. 15 mL 11   Insulin Pen Needle 31G X 5 MM MISC 1 Units by Does not apply route in the morning, at noon, in the evening, and at bedtime. 400 each 3   metFORMIN  (GLUCOPHAGE-XR) 500 MG 24 hr tablet Take 1 tablet (500 mg total) by mouth in the morning and at bedtime. 180 tablet 3   rosuvastatin (CRESTOR) 40 MG tablet Take 1 tablet (40 mg total) by mouth at bedtime. 90 tablet 3   Semaglutide, 1 MG/DOSE, 4 MG/3ML SOPN Inject 1 mg as directed once a week. 9 mL 3   tiZANidine (ZANAFLEX) 4 MG tablet Take 1 tablet (4 mg total) by mouth every 6 (six) hours as needed  for muscle spasms. 30 tablet 0   No current facility-administered medications for this visit.    REVIEW OF SYSTEMS:  [X]  denotes positive finding, [ ]  denotes negative finding Cardiac  Comments:  Chest pain or chest pressure:    Shortness of breath upon exertion:    Short of breath when lying flat:    Irregular heart rhythm:        Vascular    Pain in calf, thigh, or hip brought on by ambulation: x Left calf/thigh  Pain in feet at night that wakes you up from your sleep:     Blood clot in your veins:    Leg swelling:         Pulmonary    Oxygen at home:    Productive cough:     Wheezing:         Neurologic    Sudden weakness in arms or legs:     Sudden numbness in arms or legs:     Sudden onset of difficulty speaking or slurred speech:    Temporary loss of vision in one eye:     Problems with dizziness:         Gastrointestinal    Blood in stool:     Vomited blood:         Genitourinary    Burning when urinating:     Blood in urine:        Psychiatric    Major depression:         Hematologic    Bleeding problems:    Problems with blood clotting too easily:        Skin    Rashes or ulcers:        Constitutional    Fever or chills:      PHYSICAL EXAM: Vitals:   07/09/22 1427  BP: 98/67  Pulse: 85  Temp: 97.6 F (36.4 C)  TempSrc: Temporal  SpO2: 98%  Weight: 184 lb (83.5 kg)     GENERAL: The patient is a well-nourished female, in no acute distress. The vital signs are documented above. CARDIAC: There is a regular rate and rhythm.  VASCULAR:  Bilateral  femoral pulses palpable Brisk left DP Doppler signal  DATA:   CTA reviewed from 07/04/2022 with total occlusion of the left common femoral artery.  Left lower extremity arterial duplex 03/06/22 shows a 50 to 74% stenosis in the iliac segment as well as the common femoral artery.  ABI 2/211/24 1.12 biphasic right and 1.0 biphasic left   Assessment/Plan:  62 y.o. female with hx HTN, HLD, DM, PAD that presents for follow-up after CTA for evaluation of recurrent left leg claudication.  She previously underwent a left common femoral endarterectomy with bovine patch on 02/12/2021 for lifestyle limiting claudication.    I sent her for CTA after duplex suggested significant stenosis in the left iliac and common femoral segment.  I reviewed her CTA showing left common femoral occlusion in the segment that we performed endarterectomy last year.  I worry about the durability of a redo endarterectomy given very small arteries.  We had a long discussion about pursuing surgery again versus medical therapy.  She is already on Pletal.  We talked about walking therapies as an alternative.  She is going to try this.  We discussed going to the gym 3 times a week and walking on the track and trying to walk through her claudication discomfort to build collaterals.  I will see her in 3 months.  If she fails this we will need redo left femoral endarterectomy.  Cephus Shelling, MD [facility] Office: [phone]

## 2022-07-10 ENCOUNTER — Other Ambulatory Visit: Payer: Self-pay | Admitting: Nurse Practitioner

## 2022-07-10 DIAGNOSIS — R0602 Shortness of breath: Secondary | ICD-10-CM

## 2022-07-11 ENCOUNTER — Ambulatory Visit
Admission: RE | Admit: 2022-07-11 | Discharge: 2022-07-11 | Disposition: A | Discharge status: Home / Self Care | Payer: Medicare HMO | Source: Ambulatory Visit | Attending: Nurse Practitioner | Admitting: Nurse Practitioner

## 2022-07-11 DIAGNOSIS — Z1231 Encounter for screening mammogram for malignant neoplasm of breast: Secondary | ICD-10-CM

## 2022-07-11 NOTE — Telephone Encounter (Signed)
Requesting: ALBUTEROL HFA INH(200 PUFFS) 18GM  Last Visit: 05/07/2022 Next Visit: Visit date not found Last Refill: 04/02/2021  Please Advise

## 2022-07-16 ENCOUNTER — Other Ambulatory Visit: Payer: Self-pay

## 2022-07-16 DIAGNOSIS — I739 Peripheral vascular disease, unspecified: Secondary | ICD-10-CM

## 2022-08-10 ENCOUNTER — Other Ambulatory Visit: Payer: Self-pay | Admitting: Nurse Practitioner

## 2022-08-10 DIAGNOSIS — F3175 Bipolar disorder, in partial remission, most recent episode depressed: Secondary | ICD-10-CM

## 2022-08-12 DIAGNOSIS — E1142 Type 2 diabetes mellitus with diabetic polyneuropathy: Secondary | ICD-10-CM | POA: Diagnosis not present

## 2022-08-12 DIAGNOSIS — E1165 Type 2 diabetes mellitus with hyperglycemia: Secondary | ICD-10-CM | POA: Diagnosis not present

## 2022-08-15 ENCOUNTER — Telehealth: Payer: Self-pay | Admitting: Nurse Practitioner

## 2022-08-15 ENCOUNTER — Other Ambulatory Visit: Payer: Self-pay

## 2022-08-15 NOTE — Telephone Encounter (Signed)
Called the pharmacy and they do not know why this medication has been recalled. They suggested that the patient call the manufacture of the medication.  Autumn Valenzuela requested if the patient can send [place] the letter she received. I called the patient and she will send [place] the letter she  received.

## 2022-08-15 NOTE — Telephone Encounter (Signed)
Pt stated she received a letter telling her that the med DULoxetine (CYMBALTA) 60 MG capsule [[id number]] has been recalled and wanted to request a different med.

## 2022-09-17 ENCOUNTER — Ambulatory Visit (HOSPITAL_COMMUNITY)
Admission: RE | Admit: 2022-09-17 | Discharge: 2022-09-17 | Disposition: A | Discharge status: Home / Self Care | Payer: Medicare HMO | Source: Ambulatory Visit | Attending: Vascular Surgery | Admitting: Vascular Surgery

## 2022-09-17 ENCOUNTER — Ambulatory Visit (INDEPENDENT_AMBULATORY_CARE_PROVIDER_SITE_OTHER): Payer: Medicare HMO | Admitting: Vascular Surgery

## 2022-09-17 ENCOUNTER — Encounter: Payer: Self-pay | Admitting: Vascular Surgery

## 2022-09-17 VITALS — BP 114/79 | HR 80 | Temp 97.8°F | Resp 18 | Ht 61.0 in | Wt 183.1 lb

## 2022-09-17 DIAGNOSIS — I739 Peripheral vascular disease, unspecified: Secondary | ICD-10-CM | POA: Diagnosis not present

## 2022-09-17 DIAGNOSIS — I70212 Atherosclerosis of native arteries of extremities with intermittent claudication, left leg: Secondary | ICD-10-CM | POA: Diagnosis not present

## 2022-09-17 LAB — VAS US ABI WITH/WO TBI
Left ABI: 0.79
Right ABI: 1.15

## 2022-09-17 NOTE — Progress Notes (Signed)
Patient name: Autumn Valenzuela MRN: [id number] DOB: 12/01/1960 Sex: female  REASON FOR VISIT: 3 month follow-up with ABI  HPI: Autumn Valenzuela is a 62 y.o. female with hx HTN, HLD, DM, PAD that presents for 3 month follow-up with ABI.  She previously underwent a left common femoral endarterectomy with bovine patch on 02/12/2021 for lifestyle limiting claudication and did well after surgery.  Had evidence of stenosis in the left iliac as well as the common femoral artery with new symptoms.  I sent her for CTA that showed her left common femoral artery occluded on 07/04/22 after she had recurrent symptoms.  We have been trying to manage this conservatively as she had very poor durability with the first operation and has small arteries.  Today she thinks her left leg is doing slightly worse.  She is taking Pletal as prescribed.  No cigarettes but marijuana use.  Past Medical History:  Diagnosis Date   Allergy    Anemia    Anxiety    Arthritis    Asthma    Back pain    Bipolar disorder (HCC)    CVA (cerebral infarction)    Depression    Diabetes mellitus without complication (HCC)    High cholesterol    Hypertension    Left leg pain    Migraine    Sleep apnea    does not use a cpap   Stroke (HCC) 2007   no lasting weakness    Past Surgical History:  Procedure Laterality Date   ABDOMINAL AORTOGRAM W/LOWER EXTREMITY N/A 02/08/2021   Procedure: ABDOMINAL AORTOGRAM W/LOWER EXTREMITY;  Surgeon: Cephus Shelling, MD;  Location: [facility];  Service: Cardiovascular;  Laterality: N/A;   ABDOMINAL HYSTERECTOMY     ANTERIOR LATERAL LUMBAR FUSION WITH PERCUTANEOUS SCREW 1 LEVEL Left 02/11/2018   Procedure: LEFT LATERAL LUMBAR  FOUR-FIVE INTERBODY FUSION WITH INSTRUMENTATION AND ALLOGRAFT;  Surgeon: Estill Bamberg, MD;  Location: [facility];  Service: Orthopedics;  Laterality: Left;  LEFT LATERAL LUMBAR  FOUR-FIVE INTERBODY FUSION WITH INSTRUMENTATION AND ALLOGRAFT   BACK SURGERY     Lumbar  fusion L5-S1   DIABETES     ENDARTERECTOMY FEMORAL Left 02/12/2021   Procedure: LEFT COMMON FEMORAL ENDARTERECTOMY WITH BOVINE PATCH;  Surgeon: Cephus Shelling, MD;  Location: [facility];  Service: Vascular;  Laterality: Left;   EYE SURGERY Bilateral    INGUINAL LYMPH NODE BIOPSY Left 07/25/2021   Procedure: EXCISIONAL BIOPSY LEFT INGUINAL LYMPH NODES;  Surgeon: Abigail Miyamoto, MD;  Location: [facility];  Service: General;  Laterality: Left;   KNEE SURGERY     l 4-l5 status post lateral posterior fusion January 30,2020     NEUROFORAMINAL STENOSIS Left    Severe L4-L5, involving the level above previous fusion.   PARTIAL HYSTERECTOMY     RADICULOPATHY Left    L4, Secondary to an L4-L5 Spondylolisthesis   SHOULDER SURGERY      Family History  Problem Relation Age of Onset   Diabetes Mother    Stroke Father    Cancer Paternal Aunt    Cancer Paternal Aunt    Diabetes Brother    Colon cancer Neg Hx    Esophageal cancer Neg Hx    Rectal cancer Neg Hx    Stomach cancer Neg Hx     SOCIAL HISTORY: Social History   Tobacco Use   Smoking status: Former    Current packs/day: 0.00    Types: Cigarettes  Quit date: 12/13/2015    Years since quitting: 6.7   Smokeless tobacco: Never  Substance Use Topics   Alcohol use: Not Currently    Comment: Occas    No Known Allergies  Current Outpatient Medications  Medication Sig Dispense Refill   Accu-Chek FastClix Lancets MISC TEST 4 TIMES DAILY. DX E11.9 306 each 3   albuterol (PROVENTIL) (2.5 MG/3ML) 0.083% nebulizer solution Take 2.5 mg by nebulization every 6 (six) hours as needed for wheezing.     albuterol (VENTOLIN HFA) 108 (90 Base) MCG/ACT inhaler INHALE 2 PUFFS INTO THE LUNGS DAILY AS NEEDED FOR WHEEZING OR SHORTNESS OF BREATH 18 g 0   aspirin EC 81 MG EC tablet Take 1 tablet (81 mg total) by mouth daily at 6 (six) AM. Swallow whole. 30 tablet 11   Blood Glucose Calibration (ACCU-CHEK GUIDE CONTROL) LIQD 1 each  by In Vitro route every 30 (thirty) days. Needs Accu-check guide monitor system. DX. E11.9 (Patient taking differently: 1 each by In Vitro route every 30 (thirty) days. Needs Accu-check guide monitor system. DX. E11.9 2 times daily) 3 each 3   blood glucose meter kit and supplies Dispense based on patient and insurance preference. Use up to four times daily as directed. (FOR ICD-10 E10.9, E11.9). 1 each 0   buPROPion (WELLBUTRIN) 75 MG tablet TAKE 1 TABLET(75 MG) BY MOUTH TWICE DAILY 180 tablet 3   Cholecalciferol (VITAMIN D3) 1.25 MG (50000 UT) CAPS Take 1 capsule by mouth once a week. 4 capsule 0   cilostazol (PLETAL) 100 MG tablet Take 1 tablet (100 mg total) by mouth 2 (two) times daily before a meal. 60 tablet 11   diclofenac Sodium (VOLTAREN) 1 % GEL Apply 2 g topically 4 (four) times daily. (Patient taking differently: Apply 2 g topically 4 (four) times daily as needed (pain).) 100 g 1   DULoxetine (CYMBALTA) 60 MG capsule TAKE 1 CAPSULE(60 MG) BY MOUTH TWICE DAILY 180 capsule 3   empagliflozin (JARDIANCE) 25 MG TABS tablet Take 1 tablet (25 mg total) by mouth daily. 90 tablet 3   ferrous sulfate 325 (65 FE) MG tablet Take 1 tablet (325 mg total) by mouth every other day. (Patient taking differently: Take 325 mg by mouth daily.) 45 tablet 3   fluticasone (FLONASE) 50 MCG/ACT nasal spray Place 2 sprays into both nostrils daily as needed for allergies. 16 g 5   gabapentin (NEURONTIN) 300 MG capsule Take 1 capsule (300 mg total) by mouth 3 (three) times daily. 270 capsule 3   glucose blood (ONETOUCH VERIO) test strip CHECK UPTO FOUR TIMES DAILY AS DIRECTED 100 strip 5   insulin aspart (NOVOLOG FLEXPEN) 100 UNIT/ML FlexPen Max daily 30 units (Patient taking differently: Inject 1-4 Units into the skin 3 (three) times daily as needed (blood sugar over 150). Max daily 30 units) 15 mL 6   Insulin Glargine (BASAGLAR KWIKPEN) 100 UNIT/ML Inject 14 Units into the skin daily. 15 mL 11   Insulin Pen Needle  31G X 5 MM MISC 1 Units by Does not apply route in the morning, at noon, in the evening, and at bedtime. 400 each 3   metFORMIN (GLUCOPHAGE-XR) 500 MG 24 hr tablet Take 1 tablet (500 mg total) by mouth in the morning and at bedtime. 180 tablet 3   rosuvastatin (CRESTOR) 40 MG tablet Take 1 tablet (40 mg total) by mouth at bedtime. 90 tablet 3   Semaglutide, 1 MG/DOSE, 4 MG/3ML SOPN Inject 1 mg as directed once a  week. 9 mL 3   tiZANidine (ZANAFLEX) 4 MG tablet Take 1 tablet (4 mg total) by mouth every 6 (six) hours as needed for muscle spasms. 30 tablet 0   No current facility-administered medications for this visit.    REVIEW OF SYSTEMS:  [X]  denotes positive finding, [ ]  denotes negative finding Cardiac  Comments:  Chest pain or chest pressure:    Shortness of breath upon exertion:    Short of breath when lying flat:    Irregular heart rhythm:        Vascular    Pain in calf, thigh, or hip brought on by ambulation: x Left calf/thigh  Pain in feet at night that wakes you up from your sleep:     Blood clot in your veins:    Leg swelling:         Pulmonary    Oxygen at home:    Productive cough:     Wheezing:         Neurologic    Sudden weakness in arms or legs:     Sudden numbness in arms or legs:     Sudden onset of difficulty speaking or slurred speech:    Temporary loss of vision in one eye:     Problems with dizziness:         Gastrointestinal    Blood in stool:     Vomited blood:         Genitourinary    Burning when urinating:     Blood in urine:        Psychiatric    Major depression:         Hematologic    Bleeding problems:    Problems with blood clotting too easily:        Skin    Rashes or ulcers:        Constitutional    Fever or chills:      PHYSICAL EXAM: There were no vitals filed for this visit.    GENERAL: The patient is a well-nourished female, in no acute distress. The vital signs are documented above. CARDIAC: There is a regular rate  and rhythm.  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  Assessment/Plan:  62 y.o. female with hx HTN, HLD, DM, PAD that presents for 54-month follow-up for her recurrent left leg symptoms.  She previously underwent a left common femoral endarterectomy with bovine patch on 02/12/2021 for lifestyle limiting claudication.  She did well after surgery but unfortunately her common femoral reoccluded earlier this year in June.  I have discussed my concern for redo endarterectomy with small arteries given the poor durability with the initial operation.  We are trying to pursue medical therapy for now with walking, Pletal, smoking cessation.  She does feel her symptoms are slightly worse and discussed if she fails conservative therapy we can move forward with redo surgery.  I will see her again in 3 months but she does not feel her symptoms are severe enough that she would pursue surgical intervention at this time.    Cephus Shelling, MD [facility] Office: [phone]

## 2022-09-19 ENCOUNTER — Telehealth: Payer: Self-pay

## 2022-09-19 MED ORDER — TIZANIDINE HCL 4 MG PO TABS: 4.0000 mg | ORAL_TABLET | Freq: Two times a day (BID) | ORAL | 5 refills | Status: AC | PRN

## 2022-09-19 NOTE — Telephone Encounter (Signed)
-----   Message from Cephus Shelling sent at 09/19/2022  1:42 PM EDT ----- Regarding: RE: Please Advise She told me that she takes her muscle relaxer every day.  She has Zanaflex on her med list.  I specifically asked her about this and she stated she takes it every single day.  As a result I did not send in any new prescription.  We discussed her taking an extra dose at nighttime.  If she indeed does not take it and does not have a prescription can you have somebody send it in for her?  Thanks,  Thayer Ohm ----- Message ----- From: Hurshel Keys, RN Sent: 09/19/2022   1:38 PM EDT To: Cephus Shelling, MD Subject: Please Advise                                  Dr. Chestine Spore,  This pt called stating that she wasn't sure about muscle relaxers at her visit with you on 9/3, but she confirmed that she has NOT taken any. She called to question if you were planning on sending some in for her. Please advise.  Thanks, Lawanna Kobus, RN - Triage

## 2022-09-19 NOTE — Telephone Encounter (Signed)
Pt called stating that she and Dr. Chestine Spore discussed muscle relaxer prescription at her office visit on 9/3. She stated that at the time, she was unsure, but she looked through her medications and she in fact, did not have any. She was requesting a prescription be sent in for her as Dr. Chestine Spore had discussed.   Staff msg sent to Dr. Chestine Spore to inform him of the pt clarification. He replied to refill if she did not have any.  Reviewed pt's chart, returned call for clarification, two identifiers used. Confirmed that the Zanaflex prescription listed in the chart was written in 11/2021 with only 30 pills and she did not have any remaining. She again confirmed and a refill was sent in to reflect 1 tablet BID PRN as discussed in clinic.

## 2022-09-25 NOTE — Progress Notes (Unsigned)
Name: Autumn Valenzuela  Age/ Sex: 62 y.o., female   MRN/ DOB: [id number], 04/11/1960     PCP: Anne Ng, NP   Reason for Endocrinology Evaluation: Type 2 Diabetes Mellitus  Initial Endocrine Consultative Visit: 03/08/2019    PATIENT IDENTIFIER: Autumn Valenzuela is a 62 y.o. female with a past medical history of .T2Dm, bipolar disorder,migrane headaches  and dyslipidemia  The patient has followed with Endocrinology clinic since 03/08/2019 for consultative assistance with management of her diabetes.  DIABETIC HISTORY:  Autumn Valenzuela was diagnosed with T2DM in 2018.  She has been tried on oral glycemic agents such as glipizide and Metformin, she is intolerant to higher doses of Metformin.  She has been on insulin since 2018.  Her hemoglobin A1c has ranged from  6.7% in 2018, peaking at 14.6% in 2021   On her initial visit to our clinic she had an A1c of 14.6%, she was on metformin and Lantus. We continued that and started Jardiance. By 09/2020 we provided her with novolog per correction scale   Started Ozempic  SUBJECTIVE:   During the last visit (03/25/2022): A1c of 6.8 %.     Today (09/26/2022): Ms. [name] is here for follow-up on diabetes management. .She checks her  glucose multiple times daily through CGM. She has had multiple episodes of hypoglycemic episodes, she is symptomatic with these episodes.    She was evaluated by vascular surgery for claudications, undergoing medical therapy  Has noted with weight loss  Denies nausea or vomiting  Denies constipation or diarrhea     HOME DIABETES REGIMEN:  Metformin 500 mg BID  Basaglar 14 units daily Jardiance 25 mg daily  Ozempic  1 mg weekly Correction scale : Novolog (BG- 130/20)     Statin: Yes ACE-I/ARB: No  CONTINUOUS GLUCOSE MONITORING RECORD INTERPRETATION    Dates of Recording: 8/30-9/12/2022  Sensor description:dexcom  Results statistics:   CGM use % of time 79  Average and SD 95/21  Time in range 96    %  % Time Above 180 0  % Time above 250 4  % Time Below target <1   Glycemic patterns summary: Optimal BG's through the day and night Hyperglycemic episodes n/a  Hypoglycemic episodes occurred during the day and night  Overnight periods: Trends down      DIABETIC COMPLICATIONS: Microvascular complications:  Neuropathy Denies: CKD, retinopathy  Last eye exam: Completed 08/02/2021   Macrovascular complications:   S/P left common femoral endarterectomy with bovine patch on 02/12/2021 Denies: CAD, CVA     HISTORY:  Past Medical History:  Past Medical History:  Diagnosis Date   Allergy    Anemia    Anxiety    Arthritis    Asthma    Back pain    Bipolar disorder (HCC)    CVA (cerebral infarction)    Depression    Diabetes mellitus without complication (HCC)    High cholesterol    Hypertension    Left leg pain    Migraine    Sleep apnea    does not use a cpap   Stroke (HCC) 2007   no lasting weakness   Past Surgical History:  Past Surgical History:  Procedure Laterality Date   ABDOMINAL AORTOGRAM W/LOWER EXTREMITY N/A 02/08/2021   Procedure: ABDOMINAL AORTOGRAM W/LOWER EXTREMITY;  Surgeon: Cephus Shelling, MD;  Location: [facility];  Service: Cardiovascular;  Laterality: N/A;   ABDOMINAL HYSTERECTOMY     ANTERIOR LATERAL LUMBAR FUSION  WITH PERCUTANEOUS SCREW 1 LEVEL Left 02/11/2018   Procedure: LEFT LATERAL LUMBAR  FOUR-FIVE INTERBODY FUSION WITH INSTRUMENTATION AND ALLOGRAFT;  Surgeon: Estill Bamberg, MD;  Location: [facility];  Service: Orthopedics;  Laterality: Left;  LEFT LATERAL LUMBAR  FOUR-FIVE INTERBODY FUSION WITH INSTRUMENTATION AND ALLOGRAFT   BACK SURGERY     Lumbar fusion L5-S1   DIABETES     ENDARTERECTOMY FEMORAL Left 02/12/2021   Procedure: LEFT COMMON FEMORAL ENDARTERECTOMY WITH BOVINE PATCH;  Surgeon: Cephus Shelling, MD;  Location: [facility];  Service: Vascular;  Laterality: Left;   EYE SURGERY Bilateral    INGUINAL LYMPH NODE BIOPSY  Left 07/25/2021   Procedure: EXCISIONAL BIOPSY LEFT INGUINAL LYMPH NODES;  Surgeon: Abigail Miyamoto, MD;  Location: [facility];  Service: General;  Laterality: Left;   KNEE SURGERY     l 4-l5 status post lateral posterior fusion January 30,2020     NEUROFORAMINAL STENOSIS Left    Severe L4-L5, involving the level above previous fusion.   PARTIAL HYSTERECTOMY     RADICULOPATHY Left    L4, Secondary to an L4-L5 Spondylolisthesis   SHOULDER SURGERY     Social History:  reports that she quit smoking about 6 years ago. Her smoking use included cigarettes. She has never used smokeless tobacco. She reports that she does not currently use alcohol. She reports that she does not currently use drugs after having used the following drugs: Marijuana. Family History:  Family History  Problem Relation Age of Onset   Diabetes Mother    Stroke Father    Cancer Paternal Aunt    Cancer Paternal Aunt    Diabetes Brother    Colon cancer Neg Hx    Esophageal cancer Neg Hx    Rectal cancer Neg Hx    Stomach cancer Neg Hx      HOME MEDICATIONS: Allergies as of 09/26/2022   No Known Allergies      Medication List        Accurate as of September 26, 2022  8:40 AM. If you have any questions, ask your nurse or doctor.          Accu-Chek FastClix Lancets Misc TEST 4 TIMES DAILY. DX E11.9   Accu-Chek Guide Control Liqd 1 each by In Vitro route every 30 (thirty) days. Needs Accu-check guide monitor system. DX. E11.9 What changed: additional instructions   albuterol 108 (90 Base) MCG/ACT inhaler Commonly known as: VENTOLIN HFA INHALE 2 PUFFS INTO THE LUNGS DAILY AS NEEDED FOR WHEEZING OR SHORTNESS OF BREATH   albuterol (2.5 MG/3ML) 0.083% nebulizer solution Commonly known as: PROVENTIL Take 2.5 mg by nebulization every 6 (six) hours as needed for wheezing.   aspirin EC 81 MG tablet Take 1 tablet (81 mg total) by mouth daily at 6 (six) AM. Swallow whole.   Basaglar KwikPen  100 UNIT/ML Inject 14 Units into the skin daily.   blood glucose meter kit and supplies Dispense based on patient and insurance preference. Use up to four times daily as directed. (FOR ICD-10 E10.9, E11.9).   buPROPion 75 MG tablet Commonly known as: WELLBUTRIN TAKE 1 TABLET(75 MG) BY MOUTH TWICE DAILY   cilostazol 100 MG tablet Commonly known as: PLETAL Take 1 tablet (100 mg total) by mouth 2 (two) times daily before a meal.   diclofenac Sodium 1 % Gel Commonly known as: Voltaren Apply 2 g topically 4 (four) times daily. What changed:  when to take this reasons to take this   DULoxetine 60  MG capsule Commonly known as: CYMBALTA TAKE 1 CAPSULE(60 MG) BY MOUTH TWICE DAILY   empagliflozin 25 MG Tabs tablet Commonly known as: Jardiance Take 1 tablet (25 mg total) by mouth daily.   ferrous sulfate 325 (65 FE) MG tablet Take 1 tablet (325 mg total) by mouth every other day. What changed: when to take this   fluticasone 50 MCG/ACT nasal spray Commonly known as: FLONASE Place 2 sprays into both nostrils daily as needed for allergies.   gabapentin 300 MG capsule Commonly known as: NEURONTIN Take 1 capsule (300 mg total) by mouth 3 (three) times daily.   Insulin Pen Needle 31G X 5 MM Misc 1 Units by Does not apply route in the morning, at noon, in the evening, and at bedtime.   metFORMIN 500 MG 24 hr tablet Commonly known as: GLUCOPHAGE-XR Take 1 tablet (500 mg total) by mouth in the morning and at bedtime.   NovoLOG FlexPen 100 UNIT/ML FlexPen Generic drug: insulin aspart Max daily 30 units What changed:  how much to take how to take this when to take this reasons to take this   OneTouch Verio test strip Generic drug: glucose blood CHECK UPTO FOUR TIMES DAILY AS DIRECTED   rosuvastatin 40 MG tablet Commonly known as: CRESTOR Take 1 tablet (40 mg total) by mouth at bedtime.   Semaglutide (1 MG/DOSE) 4 MG/3ML Sopn Inject 1 mg as directed once a week.    tiZANidine 4 MG tablet Commonly known as: Zanaflex Take 1 tablet (4 mg total) by mouth 2 (two) times daily as needed for muscle spasms.   Vitamin D3 1.25 MG (50000 UT) Caps Take 1 capsule by mouth once a week.         OBJECTIVE:   Vital Signs: Ht 5\' 1"  (1.549 m)   Wt 179 lb (81.2 kg)   BMI 33.82 kg/m   Wt Readings from Last 3 Encounters:  09/26/22 179 lb (81.2 kg)  09/17/22 183 lb 1.6 oz (83.1 kg)  07/09/22 184 lb (83.5 kg)     Exam: General: Pt appears well and is in NAD  Lungs: Clear with good BS bilat   Heart: RRR  Extremities: No pretibial edema.   Neuro: MS is good with appropriate affect, pt is alert and Ox3   DM Foot Exam 09/26/2022    The skin of the feet is intact without sores or ulcerations. The pedal pulses are undetectable  on the left, 2+ on the right  The sensation is intact  to a screening 5.07, 10 gram monofilament bilaterally    DATA REVIEWED:  Lab Results  Component Value Date   HGBA1C 6.8 (A) 03/25/2022   HGBA1C 5.8 (A) 09/24/2021   HGBA1C 9.2 (A) 05/18/2021    Latest Reference Range & Units 06/11/22 09:37  Sodium 135 - 145 mEq/L 141  Potassium 3.5 - 5.1 mEq/L 4.4  Chloride 96 - 112 mEq/L 107  CO2 19 - 32 mEq/L 26  Glucose 70 - 99 mg/dL 82  BUN 6 - 23 mg/dL 9  Creatinine 6.29 - 5.28 mg/dL 4.13  Calcium 8.4 - 24.4 mg/dL 9.2  Alkaline Phosphatase 39 - 117 U/L 48  Albumin 3.5 - 5.2 g/dL 4.0  AST 0 - 37 U/L 11  ALT 0 - 35 U/L 6  Total Protein 6.0 - 8.3 g/dL 7.0  Total Bilirubin 0.2 - 1.2 mg/dL 0.3  GFR >01.02 mL/min 80.46    Latest Reference Range & Units 06/11/22 09:37  Direct LDL mg/dL 73.0  09/17/22 13:30  Left ABI 0.79  Right ABI 1.15      ASSESSMENT / PLAN / RECOMMENDATIONS:   1) Type 2 Diabetes Mellitus, Optimally  controlled , With neuropathic and macrovascular complications - Most recent A1c of 6.0 %. Goal A1c <7.0%.    -A1c is at 6.0%, most likely this is due to recurrent hypoglycemia - She has not needed to  use any NovoLog, will discontinue Basaglar and increase Ozempic as below    MEDICATIONS: Increase Ozempic 2 mg weekly STOP Basaglar 14 units daily Continue Metformin 500 mg 1 tablet with breakfast Continue Jardiance 25 mg daily   EDUCATION / INSTRUCTIONS: BG monitoring instructions: Patient is instructed to check her blood sugars 2 times a day, fasting and bedtime as much as possible Call [facility] if: BG persistently  <70 I reviewed the Rule of 15 for the treatment of hypoglycemia in detail with the patient. Literature supplied.    2) Diabetic complications:  Eye: Does not have known diabetic retinopathy.  Neuro/ Feet: Does have known diabetic peripheral neuropathy. Renal: Patient does not have known baseline CKD. She is not an ACEI/ARB at present     F/U in 4 months    Signed electronically by: Lyndle Herrlich, MD  [facility]  [facility] [address] [place] [place], [place] [address] Phone: [phone] FAX: [phone]   CC: Anne Ng, NP [address] [place] [address] Phone: [phone]  Fax: [phone]  Return to Endocrinology clinic as below: Future Appointments  Date Time Provider Department Center  11/06/2022 10:00 AM Nche, Bonna Gains, NP [facility]  12/17/2022 10:20 AM Cephus Shelling, MD [facility]

## 2022-09-26 ENCOUNTER — Encounter: Payer: Self-pay | Admitting: Internal Medicine

## 2022-09-26 ENCOUNTER — Ambulatory Visit (INDEPENDENT_AMBULATORY_CARE_PROVIDER_SITE_OTHER): Payer: Medicare HMO | Admitting: Internal Medicine

## 2022-09-26 VITALS — Ht 61.0 in | Wt 179.0 lb

## 2022-09-26 DIAGNOSIS — E1165 Type 2 diabetes mellitus with hyperglycemia: Secondary | ICD-10-CM | POA: Diagnosis not present

## 2022-09-26 DIAGNOSIS — Z794 Long term (current) use of insulin: Secondary | ICD-10-CM | POA: Diagnosis not present

## 2022-09-26 LAB — POCT GLYCOSYLATED HEMOGLOBIN (HGB A1C): Hemoglobin A1C: 6 % — AB (ref 4.0–5.6)

## 2022-09-26 MED ORDER — METFORMIN HCL ER 500 MG PO TB24: 500.0000 mg | ORAL_TABLET | Freq: Two times a day (BID) | ORAL | 3 refills | Status: DC

## 2022-09-26 MED ORDER — SEMAGLUTIDE (2 MG/DOSE) 8 MG/3ML ~~LOC~~ SOPN: 2.0000 mg | PEN_INJECTOR | SUBCUTANEOUS | 3 refills | Status: DC

## 2022-09-26 MED ORDER — EMPAGLIFLOZIN 25 MG PO TABS: 25.0000 mg | ORAL_TABLET | Freq: Every day | ORAL | 3 refills | Status: DC

## 2022-09-26 NOTE — Patient Instructions (Addendum)
-   INcrease  Ozempic 2 mg weekly  - Continue Metformin 500 mg 1 tablet Twice a day - Continue Jardiance to 25 mg , 1 tablet daily - Stop insulin      HOW TO TREAT LOW BLOOD SUGARS (Blood sugar LESS THAN 70 MG/DL) Please follow the RULE OF 15 for the treatment of hypoglycemia treatment (when your (blood sugars are less than 70 mg/dL)   STEP 1: Take 15 grams of carbohydrates when your blood sugar is low, which includes:  3-4 GLUCOSE TABS  OR 3-4 OZ OF JUICE OR REGULAR SODA OR ONE TUBE OF GLUCOSE GEL    STEP 2: RECHECK blood sugar in 15 MINUTES STEP 3: If your blood sugar is still low at the 15 minute recheck --> then, go back to STEP 1 and treat AGAIN with another 15 grams of carbohydrates.

## 2022-11-06 ENCOUNTER — Encounter: Payer: Self-pay | Admitting: Nurse Practitioner

## 2022-11-06 ENCOUNTER — Other Ambulatory Visit (HOSPITAL_COMMUNITY)
Admission: RE | Admit: 2022-11-06 | Discharge: 2022-11-06 | Disposition: A | Discharge status: Home / Self Care | Payer: Medicare HMO | Source: Ambulatory Visit | Attending: Nurse Practitioner | Admitting: Nurse Practitioner

## 2022-11-06 ENCOUNTER — Ambulatory Visit: Payer: Medicare HMO | Admitting: Nurse Practitioner

## 2022-11-06 VITALS — BP 113/75 | HR 93 | Temp 98.1°F | Resp 18 | Ht 61.0 in | Wt 174.6 lb

## 2022-11-06 DIAGNOSIS — I10 Essential (primary) hypertension: Secondary | ICD-10-CM

## 2022-11-06 DIAGNOSIS — Z124 Encounter for screening for malignant neoplasm of cervix: Secondary | ICD-10-CM | POA: Diagnosis not present

## 2022-11-06 DIAGNOSIS — E785 Hyperlipidemia, unspecified: Secondary | ICD-10-CM | POA: Diagnosis not present

## 2022-11-06 DIAGNOSIS — Z90710 Acquired absence of both cervix and uterus: Secondary | ICD-10-CM | POA: Diagnosis not present

## 2022-11-06 DIAGNOSIS — E1169 Type 2 diabetes mellitus with other specified complication: Secondary | ICD-10-CM | POA: Diagnosis not present

## 2022-11-06 DIAGNOSIS — Z1151 Encounter for screening for human papillomavirus (HPV): Secondary | ICD-10-CM | POA: Diagnosis not present

## 2022-11-06 DIAGNOSIS — Z23 Encounter for immunization: Secondary | ICD-10-CM | POA: Diagnosis not present

## 2022-11-06 DIAGNOSIS — Z794 Long term (current) use of insulin: Secondary | ICD-10-CM

## 2022-11-06 DIAGNOSIS — Z01419 Encounter for gynecological examination (general) (routine) without abnormal findings: Secondary | ICD-10-CM | POA: Diagnosis present

## 2022-11-06 LAB — MICROALBUMIN / CREATININE URINE RATIO
Creatinine,U: 95.3 mg/dL
Microalb Creat Ratio: 0.7 mg/g (ref 0.0–30.0)
Microalb, Ur: <0.7 mg/dL (ref 0.0–1.9)

## 2022-11-06 LAB — RENAL FUNCTION PANEL
Albumin: 4.4 g/dL (ref 3.5–5.2)
BUN: 10 mg/dL (ref 6–23)
CO2: 28 meq/L (ref 19–32)
Calcium: 9.7 mg/dL (ref 8.4–10.5)
Chloride: 105 meq/L (ref 96–112)
Creatinine, Ser: 0.74 mg/dL (ref 0.40–1.20)
GFR: 88.1 mL/min (ref >60.00)
Glucose, Bld: 69 mg/dL — ABNORMAL LOW (ref 70–99)
Phosphorus: 3.9 mg/dL (ref 2.3–4.6)
Potassium: 4.2 meq/L (ref 3.5–5.1)
Sodium: 140 meq/L (ref 135–145)

## 2022-11-06 LAB — LIPID PANEL
Cholesterol: 100 mg/dL (ref 0–200)
HDL: 44.3 mg/dL (ref >39.00)
LDL Cholesterol: 39 mg/dL (ref 0–99)
NonHDL: 55.66
Total CHOL/HDL Ratio: 2
Triglycerides: 82 mg/dL (ref 0.0–149.0)
VLDL: 16.4 mg/dL (ref 0.0–40.0)

## 2022-11-06 NOTE — Assessment & Plan Note (Signed)
Repeat lipid panel. ?Maintain crestor dose ?

## 2022-11-06 NOTE — Progress Notes (Signed)
Fax received from [facility] on 10/31/22 for medical clearance/medication hold for colonoscopy to be signed by C. Chestine Spore, MD.  Provider signed on 11/05/22, scanned into pt's chart, media routed via MyChart to sender on 11/06/22.

## 2022-11-06 NOTE — Assessment & Plan Note (Addendum)
L. Inguinal Lymph node excision 07/2021: EXCISION: Benign lymph node with reactive features. No malignancy identified.

## 2022-11-06 NOTE — Patient Instructions (Addendum)
Get TDAP vaccine from retail pharmacy You will be contacted to schedule appointment with opthalmology Maintain current med doses Go to lab

## 2022-11-06 NOTE — Assessment & Plan Note (Signed)
BP at goal with exforge BP Readings from Last 3 Encounters:  11/06/22 113/75  09/17/22 114/79  07/09/22 98/67   Repeat BMP Maintain med doses

## 2022-11-06 NOTE — Progress Notes (Signed)
Established Patient Visit  Patient: Autumn Valenzuela   DOB: Jun 28, 1960   62 y.o. Female  MRN: [id number] Visit Date: 11/06/2022  Subjective:    Chief Complaint  Patient presents with   office visit     PT is here for 6 month follow up HTN, DM, hyperlipidemia. PT need RX refill for Cymbalta    HPI Chronic groin pain (Left) L. Inguinal Lymph node excision 07/2021: EXCISION: Benign lymph node with reactive features. No malignancy identified.  S/P hysterectomy Secondary to menorrhagia Unsure about previous PAP smear results and pathology report.  Cervical cancer screen completed today  Hyperlipidemia associated with type 2 diabetes mellitus (HCC) Repeat lipid panel Maintain crestor dose  HTN (hypertension) BP at goal with exforge BP Readings from Last 3 Encounters:  11/06/22 113/75  09/17/22 114/79  07/09/22 98/67   Repeat BMP Maintain med doses  Reviewed medical, surgical, and social history today  Medications: Outpatient Medications Prior to Visit  Medication Sig   Accu-Chek FastClix Lancets MISC TEST 4 TIMES DAILY. DX E11.9   albuterol (PROVENTIL) (2.5 MG/3ML) 0.083% nebulizer solution Take 2.5 mg by nebulization every 6 (six) hours as needed for wheezing.   albuterol (VENTOLIN HFA) 108 (90 Base) MCG/ACT inhaler INHALE 2 PUFFS INTO THE LUNGS DAILY AS NEEDED FOR WHEEZING OR SHORTNESS OF BREATH   amLODipine-valsartan (EXFORGE) 5-320 MG tablet 1 tablet Orally Once a day for 30 day(s)   aspirin EC 81 MG EC tablet Take 1 tablet (81 mg total) by mouth daily at 6 (six) AM. Swallow whole.   Blood Glucose Calibration (ACCU-CHEK GUIDE CONTROL) LIQD 1 each by In Vitro route every 30 (thirty) days. Needs Accu-check guide monitor system. DX. E11.9 (Patient taking differently: 1 each by In Vitro route every 30 (thirty) days. Needs Accu-check guide monitor system. DX. E11.9 2 times daily)   blood glucose meter kit and supplies Dispense based on patient and insurance  preference. Use up to four times daily as directed. (FOR ICD-10 E10.9, E11.9).   buPROPion (WELLBUTRIN) 75 MG tablet TAKE 1 TABLET(75 MG) BY MOUTH TWICE DAILY   Cholecalciferol (VITAMIN D3) 1.25 MG (50000 UT) CAPS Take 1 capsule by mouth once a week.   cilostazol (PLETAL) 100 MG tablet Take 1 tablet (100 mg total) by mouth 2 (two) times daily before a meal.   diclofenac Sodium (VOLTAREN) 1 % GEL Apply 2 g topically 4 (four) times daily. (Patient taking differently: Apply 2 g topically 4 (four) times daily as needed (pain).)   divalproex (DEPAKOTE) 250 MG DR tablet 1 tablet Orally Once a day   DULCOLAX 5 MG EC tablet 2 tablets Orally twice a day for 1 days   DULoxetine (CYMBALTA) 60 MG capsule TAKE 1 CAPSULE(60 MG) BY MOUTH TWICE DAILY   empagliflozin (JARDIANCE) 25 MG TABS tablet Take 1 tablet (25 mg total) by mouth daily.   ferrous sulfate 325 (65 FE) MG tablet Take 1 tablet (325 mg total) by mouth every other day. (Patient taking differently: Take 325 mg by mouth daily.)   fluticasone (FLONASE) 50 MCG/ACT nasal spray Place 2 sprays into both nostrils daily as needed for allergies.   gabapentin (NEURONTIN) 300 MG capsule Take 1 capsule (300 mg total) by mouth 3 (three) times daily.   glucose blood (ONETOUCH VERIO) test strip CHECK UPTO FOUR TIMES DAILY AS DIRECTED   insulin aspart (NOVOLOG) 100 UNIT/ML injection as directed Injection as needed when BS  spikes over 150   Insulin Glargine (BASAGLAR KWIKPEN) 100 UNIT/ML ADMINISTER 18 UNITS UNDER THE SKIN DAILY Subcutaneous for 75 days   meloxicam (MOBIC) 15 MG tablet 15 mg.   metFORMIN (GLUCOPHAGE-XR) 500 MG 24 hr tablet Take 1 tablet (500 mg total) by mouth in the morning and at bedtime.   ondansetron (ZOFRAN) 4 MG tablet 1 tablet Orally as needed   rosuvastatin (CRESTOR) 40 MG tablet Take 1 tablet (40 mg total) by mouth at bedtime.   Semaglutide, 2 MG/DOSE, 8 MG/3ML SOPN Inject 2 mg as directed once a week.   tiZANidine (ZANAFLEX) 4 MG tablet Take  1 tablet (4 mg total) by mouth 2 (two) times daily as needed for muscle spasms.   No facility-administered medications prior to visit.   Reviewed past medical and social history.   ROS per HPI above      Objective:  BP 113/75 (BP Location: Right Arm, Patient Position: Sitting, Cuff Size: Large)   Pulse 93   Temp 98.1 F (36.7 C) (Temporal)   Resp 18   Ht 5\' 1"  (1.549 m)   Wt 174 lb 9.6 oz (79.2 kg)   SpO2 100%   BMI 32.99 kg/m      Physical Exam Vitals and nursing note reviewed. Exam conducted with a chaperone present.  Cardiovascular:     Rate and Rhythm: Normal rate and regular rhythm.     Pulses: Normal pulses.     Heart sounds: Normal heart sounds.  Pulmonary:     Effort: Pulmonary effort is normal.     Breath sounds: Normal breath sounds.  Abdominal:     Hernia: There is no hernia in the left inguinal area or right inguinal area.  Genitourinary:    General: Normal vulva.     Exam position: Lithotomy position.     Labia:        Right: No rash, tenderness or lesion.        Left: No rash, tenderness or lesion.      Vagina: Normal.     Adnexa: Right adnexa normal and left adnexa normal.       Comments: Cervix absent Subcutaneous cyst on right perineal region: no erythema, no tenderness, no induration or fluctuance. Lymphadenopathy:     Lower Body: No right inguinal adenopathy. No left inguinal adenopathy.  Neurological:     Mental Status: She is alert and oriented to person, place, and time.     No results found for any visits on 11/06/22.    Assessment & Plan:    Problem List Items Addressed This Visit     DM (diabetes mellitus) (HCC)   Relevant Medications   insulin aspart (NOVOLOG) 100 UNIT/ML injection   amLODipine-valsartan (EXFORGE) 5-320 MG tablet   Insulin Glargine (BASAGLAR KWIKPEN) 100 UNIT/ML   Other Relevant Orders   Microalbumin / creatinine urine ratio   Renal Function Panel   Ambulatory referral to Ophthalmology   HTN (hypertension)     BP at goal with exforge BP Readings from Last 3 Encounters:  11/06/22 113/75  09/17/22 114/79  07/09/22 98/67   Repeat BMP Maintain med doses      Relevant Medications   amLODipine-valsartan (EXFORGE) 5-320 MG tablet   Other Relevant Orders   Renal Function Panel   Ambulatory referral to Ophthalmology   Hyperlipidemia associated with type 2 diabetes mellitus (HCC)    Repeat lipid panel Maintain crestor dose      Relevant Medications   insulin aspart (NOVOLOG) 100 UNIT/ML injection  amLODipine-valsartan (EXFORGE) 5-320 MG tablet   Insulin Glargine (BASAGLAR KWIKPEN) 100 UNIT/ML   Other Relevant Orders   Lipid panel   S/P hysterectomy    Secondary to menorrhagia Unsure about previous PAP smear results and pathology report.  Cervical cancer screen completed today      Other Visit Diagnoses     Flu vaccine need    -  Primary   Relevant Orders   Flu vaccine trivalent PF, 6mos and older(Flulaval,Afluria,Fluarix,Fluzone) (Completed)   Encounter for Papanicolaou smear for cervical cancer screening       Relevant Orders   Cytology - PAP      Return in about 6 months (around 05/07/2023) for HTN, DM, hyperlipidemia (fasting).     Alysia Penna, NP

## 2022-11-06 NOTE — Assessment & Plan Note (Addendum)
Secondary to menorrhagia Unsure about previous PAP smear results and pathology report.  Cervical cancer screen completed today

## 2022-11-11 DIAGNOSIS — E1142 Type 2 diabetes mellitus with diabetic polyneuropathy: Secondary | ICD-10-CM | POA: Diagnosis not present

## 2022-11-11 DIAGNOSIS — E1165 Type 2 diabetes mellitus with hyperglycemia: Secondary | ICD-10-CM | POA: Diagnosis not present

## 2022-11-12 ENCOUNTER — Encounter: Payer: Self-pay | Admitting: Nurse Practitioner

## 2022-11-12 DIAGNOSIS — H11122 Conjunctival concretions, left eye: Secondary | ICD-10-CM | POA: Diagnosis not present

## 2022-11-12 DIAGNOSIS — H26491 Other secondary cataract, right eye: Secondary | ICD-10-CM | POA: Diagnosis not present

## 2022-11-12 DIAGNOSIS — E119 Type 2 diabetes mellitus without complications: Secondary | ICD-10-CM | POA: Diagnosis not present

## 2022-11-12 DIAGNOSIS — H10413 Chronic giant papillary conjunctivitis, bilateral: Secondary | ICD-10-CM | POA: Diagnosis not present

## 2022-11-12 DIAGNOSIS — H04123 Dry eye syndrome of bilateral lacrimal glands: Secondary | ICD-10-CM | POA: Diagnosis not present

## 2022-11-12 DIAGNOSIS — H40013 Open angle with borderline findings, low risk, bilateral: Secondary | ICD-10-CM | POA: Diagnosis not present

## 2022-11-12 DIAGNOSIS — Z0100 Encounter for examination of eyes and vision without abnormal findings: Secondary | ICD-10-CM | POA: Diagnosis not present

## 2022-11-12 DIAGNOSIS — Z961 Presence of intraocular lens: Secondary | ICD-10-CM | POA: Diagnosis not present

## 2022-11-12 LAB — CYTOLOGY - PAP
Comment: NEGATIVE
Diagnosis: NEGATIVE
High risk HPV: NEGATIVE

## 2022-11-12 LAB — HM DIABETES EYE EXAM

## 2022-11-19 DIAGNOSIS — K648 Other hemorrhoids: Secondary | ICD-10-CM | POA: Diagnosis not present

## 2022-11-19 DIAGNOSIS — D123 Benign neoplasm of transverse colon: Secondary | ICD-10-CM | POA: Diagnosis not present

## 2022-11-19 DIAGNOSIS — D181 Lymphangioma, any site: Secondary | ICD-10-CM | POA: Diagnosis not present

## 2022-11-19 DIAGNOSIS — Z1211 Encounter for screening for malignant neoplasm of colon: Secondary | ICD-10-CM | POA: Diagnosis not present

## 2022-11-21 ENCOUNTER — Telehealth: Payer: Self-pay | Admitting: Nurse Practitioner

## 2022-11-21 DIAGNOSIS — D123 Benign neoplasm of transverse colon: Secondary | ICD-10-CM | POA: Diagnosis not present

## 2022-11-21 DIAGNOSIS — D181 Lymphangioma, any site: Secondary | ICD-10-CM | POA: Diagnosis not present

## 2022-11-21 NOTE — Telephone Encounter (Signed)
Spoke to patient and she is doing well no concerns; PT thought she missed a phone call from office; no phone call was made. Patient verbalized understanding.

## 2022-11-21 NOTE — Telephone Encounter (Signed)
Pt said nche wanted her to call her. Please give the patient a call back

## 2022-12-12 DIAGNOSIS — E1142 Type 2 diabetes mellitus with diabetic polyneuropathy: Secondary | ICD-10-CM | POA: Diagnosis not present

## 2022-12-12 DIAGNOSIS — E1165 Type 2 diabetes mellitus with hyperglycemia: Secondary | ICD-10-CM | POA: Diagnosis not present

## 2022-12-17 ENCOUNTER — Ambulatory Visit: Payer: Medicare HMO | Admitting: Vascular Surgery

## 2022-12-24 ENCOUNTER — Ambulatory Visit (INDEPENDENT_AMBULATORY_CARE_PROVIDER_SITE_OTHER): Payer: Medicare HMO | Admitting: Vascular Surgery

## 2022-12-24 ENCOUNTER — Encounter: Payer: Self-pay | Admitting: Vascular Surgery

## 2022-12-24 VITALS — BP 104/74 | HR 88 | Temp 97.1°F | Ht 61.0 in | Wt 170.7 lb

## 2022-12-24 DIAGNOSIS — I70212 Atherosclerosis of native arteries of extremities with intermittent claudication, left leg: Secondary | ICD-10-CM | POA: Diagnosis not present

## 2022-12-24 MED ORDER — CILOSTAZOL 100 MG PO TABS: 100.0000 mg | ORAL_TABLET | Freq: Two times a day (BID) | ORAL | 11 refills | Status: AC

## 2022-12-24 NOTE — Progress Notes (Signed)
Patient name: Autumn Valenzuela MRN: [id number] DOB: 05-16-1960 Sex: female  REASON FOR VISIT: 3 month follow-up , left common femoral re-occlusion  HPI: Autumn Valenzuela is a 62 y.o. female with hx HTN, HLD, DM, PAD that presents for 3 month follow-up of her PAD.  She previously underwent a left common femoral endarterectomy with bovine patch on 02/12/2021 for lifestyle limiting claudication and did well after surgery.  Had evidence of stenosis in the left iliac as well as the common femoral artery with new symptoms.  I sent her for CTA that showed her left common femoral artery occluded on 07/04/22 after she had recurrent symptoms.  We have been trying to manage this conservatively as she had very poor durability with the first operation and has small arteries.    Today her left leg is doing about the same.  States she can walk from her car to the vegetable [place]le and then has to go slow in the grocery store.  Has also noted some discomfort on the bottom of her right foot.  States the Pletal has helped a lot.  No tobacco.    Past Medical History:  Diagnosis Date   Allergy    Anemia    Anxiety    Arthritis    Asthma    Back pain    Bipolar disorder (HCC)    CVA (cerebral infarction)    Depression    Diabetes mellitus without complication (HCC)    High cholesterol    Hypertension    Left leg pain    Migraine    Sleep apnea    does not use a cpap   Stroke (HCC) 2007   no lasting weakness    Past Surgical History:  Procedure Laterality Date   ABDOMINAL AORTOGRAM W/LOWER EXTREMITY N/A 02/08/2021   Procedure: ABDOMINAL AORTOGRAM W/LOWER EXTREMITY;  Surgeon: Cephus Shelling, MD;  [facility];  Service: Cardiovascular;  Laterality: N/A;   ABDOMINAL HYSTERECTOMY     ANTERIOR LATERAL LUMBAR FUSION WITH PERCUTANEOUS SCREW 1 LEVEL Left 02/11/2018   Procedure: LEFT LATERAL LUMBAR  FOUR-FIVE INTERBODY FUSION WITH INSTRUMENTATION AND ALLOGRAFT;  Surgeon: Estill Bamberg, MD;   [facility]: [facility];  Service: Orthopedics;  Laterality: Left;  LEFT LATERAL LUMBAR  FOUR-FIVE INTERBODY FUSION WITH INSTRUMENTATION AND ALLOGRAFT   BACK SURGERY     Lumbar fusion L5-S1   DIABETES     ENDARTERECTOMY FEMORAL Left 02/12/2021   Procedure: LEFT COMMON FEMORAL ENDARTERECTOMY WITH BOVINE PATCH;  Surgeon: Cephus Shelling, MD;  [facility]: [facility];  Service: Vascular;  Laterality: Left;   EYE SURGERY Bilateral    INGUINAL LYMPH NODE BIOPSY Left 07/25/2021   Procedure: EXCISIONAL BIOPSY LEFT INGUINAL LYMPH NODES;  Surgeon: Abigail Miyamoto, MD;  Location: [facility];  Service: General;  Laterality: Left;   KNEE SURGERY     l 4-l5 status post lateral posterior fusion January 30,2020     NEUROFORAMINAL STENOSIS Left    Severe L4-L5, involving the level above previous fusion.   PARTIAL HYSTERECTOMY     RADICULOPATHY Left    L4, Secondary to an L4-L5 Spondylolisthesis   SHOULDER SURGERY      Family History  Problem Relation Age of Onset   Diabetes Mother    Stroke Father    Cancer Paternal Aunt    Cancer Paternal Aunt    Diabetes Brother    Colon cancer Neg Hx    Esophageal cancer Neg Hx    Rectal cancer  Neg Hx    Stomach cancer Neg Hx     SOCIAL HISTORY: Social History   Tobacco Use   Smoking status: Former    Current packs/day: 0.00    Types: Cigarettes    Quit date: 12/13/2015    Years since quitting: 7.0   Smokeless tobacco: Never  Substance Use Topics   Alcohol use: Not Currently    Comment: Occas    No Known Allergies  Current Outpatient Medications  Medication Sig Dispense Refill   Accu-Chek FastClix Lancets MISC TEST 4 TIMES DAILY. DX E11.9 306 each 3   albuterol (PROVENTIL) (2.5 MG/3ML) 0.083% nebulizer solution Take 2.5 mg by nebulization every 6 (six) hours as needed for wheezing.     albuterol (VENTOLIN HFA) 108 (90 Base) MCG/ACT inhaler INHALE 2 PUFFS INTO THE LUNGS DAILY AS NEEDED FOR WHEEZING OR SHORTNESS OF BREATH 18 g 0    amLODipine-valsartan (EXFORGE) 5-320 MG tablet 1 tablet Orally Once a day for 30 day(s)     aspirin EC 81 MG EC tablet Take 1 tablet (81 mg total) by mouth daily at 6 (six) AM. Swallow whole. 30 tablet 11   Blood Glucose Calibration (ACCU-CHEK GUIDE CONTROL) LIQD 1 each by In Vitro route every 30 (thirty) days. Needs Accu-check guide monitor system. DX. E11.9 (Patient taking differently: 1 each by In Vitro route every 30 (thirty) days. Needs Accu-check guide monitor system. DX. E11.9 2 times daily) 3 each 3   blood glucose meter kit and supplies Dispense based on patient and insurance preference. Use up to four times daily as directed. (FOR ICD-10 E10.9, E11.9). 1 each 0   buPROPion (WELLBUTRIN) 75 MG tablet TAKE 1 TABLET(75 MG) BY MOUTH TWICE DAILY 180 tablet 3   Cholecalciferol (VITAMIN D3) 1.25 MG (50000 UT) CAPS Take 1 capsule by mouth once a week. 4 capsule 0   cilostazol (PLETAL) 100 MG tablet Take 1 tablet (100 mg total) by mouth 2 (two) times daily before a meal. 60 tablet 11   diclofenac Sodium (VOLTAREN) 1 % GEL Apply 2 g topically 4 (four) times daily. (Patient taking differently: Apply 2 g topically 4 (four) times daily as needed (pain).) 100 g 1   divalproex (DEPAKOTE) 250 MG DR tablet 1 tablet Orally Once a day     DULCOLAX 5 MG EC tablet 2 tablets Orally twice a day for 1 days     DULoxetine (CYMBALTA) 60 MG capsule TAKE 1 CAPSULE(60 MG) BY MOUTH TWICE DAILY 180 capsule 3   empagliflozin (JARDIANCE) 25 MG TABS tablet Take 1 tablet (25 mg total) by mouth daily. 90 tablet 3   ferrous sulfate 325 (65 FE) MG tablet Take 1 tablet (325 mg total) by mouth every other day. (Patient taking differently: Take 325 mg by mouth daily.) 45 tablet 3   fluticasone (FLONASE) 50 MCG/ACT nasal spray Place 2 sprays into both nostrils daily as needed for allergies. 16 g 5   gabapentin (NEURONTIN) 300 MG capsule Take 1 capsule (300 mg total) by mouth 3 (three) times daily. 270 capsule 3   glucose blood  (ONETOUCH VERIO) test strip CHECK UPTO FOUR TIMES DAILY AS DIRECTED 100 strip 5   insulin aspart (NOVOLOG) 100 UNIT/ML injection as directed Injection as needed when BS spikes over 150     Insulin Glargine (BASAGLAR KWIKPEN) 100 UNIT/ML ADMINISTER 18 UNITS UNDER THE SKIN DAILY Subcutaneous for 75 days     meloxicam (MOBIC) 15 MG tablet 15 mg.     metFORMIN (GLUCOPHAGE-XR) 500  MG 24 hr tablet Take 1 tablet (500 mg total) by mouth in the morning and at bedtime. 180 tablet 3   ondansetron (ZOFRAN) 4 MG tablet 1 tablet Orally as needed     rosuvastatin (CRESTOR) 40 MG tablet Take 1 tablet (40 mg total) by mouth at bedtime. 90 tablet 3   Semaglutide, 2 MG/DOSE, 8 MG/3ML SOPN Inject 2 mg as directed once a week. 9 mL 3   tiZANidine (ZANAFLEX) 4 MG tablet Take 1 tablet (4 mg total) by mouth 2 (two) times daily as needed for muscle spasms. 30 tablet 5   No current facility-administered medications for this visit.    REVIEW OF SYSTEMS:  [X]  denotes positive finding, [ ]  denotes negative finding Cardiac  Comments:  Chest pain or chest pressure:    Shortness of breath upon exertion:    Short of breath when lying flat:    Irregular heart rhythm:        Vascular    Pain in calf, thigh, or hip brought on by ambulation: x Left leg  Pain in feet at night that wakes you up from your sleep:     Blood clot in your veins:    Leg swelling:         Pulmonary    Oxygen at home:    Productive cough:     Wheezing:         Neurologic    Sudden weakness in arms or legs:     Sudden numbness in arms or legs:     Sudden onset of difficulty speaking or slurred speech:    Temporary loss of vision in one eye:     Problems with dizziness:         Gastrointestinal    Blood in stool:     Vomited blood:         Genitourinary    Burning when urinating:     Blood in urine:        Psychiatric    Major depression:         Hematologic    Bleeding problems:    Problems with blood clotting too easily:         Skin    Rashes or ulcers:        Constitutional    Fever or chills:      PHYSICAL EXAM: There were no vitals filed for this visit.    GENERAL: The patient is a well-nourished female, in no acute distress. The vital signs are documented above. CARDIAC: There is a regular rate and rhythm.  VASCULAR:  Right femoral pulse palpable Left femoral pulse nonpalpable Right DP palpable Left DP/PT monophasic but briek No lower extremity tissue loss  DATA:   ABIs previously were 1.15 on the right triphasic and 0.79 on the left monophasic  CTA reviewed from 07/04/2022 with total occlusion of the left common femoral artery.  Assessment/Plan:  62 y.o. female with hx HTN, HLD, DM, PAD that presents for 43-month follow-up for her recurrent left leg symptoms.  She previously underwent a left common femoral endarterectomy with bovine patch on 02/12/2021 for lifestyle limiting claudication.  She did well after surgery but unfortunately her common femoral reoccluded earlier this year in June.  I have discussed my concern for redo endarterectomy with small arteries given the poor durability with the initial operation.  We are trying to pursue medical therapy for now with walking, Pletal, smoking cessation.  Fortunately her symptoms are stable.  She has  seen improvement with Pletal.  She is going to the gym and walking around the track for at least 3 laps.  I am very encouraged with her efforts at medical therapy.  I will see her in 6 months with repeat ABIs.  Again discussed without worsening symptoms would avoid redo endarterectomy.    Cephus Shelling, MD [facility] Office: [phone]

## 2022-12-31 ENCOUNTER — Other Ambulatory Visit: Payer: Self-pay

## 2022-12-31 DIAGNOSIS — I739 Peripheral vascular disease, unspecified: Secondary | ICD-10-CM

## 2023-01-11 DIAGNOSIS — E1142 Type 2 diabetes mellitus with diabetic polyneuropathy: Secondary | ICD-10-CM | POA: Diagnosis not present

## 2023-01-11 DIAGNOSIS — E1165 Type 2 diabetes mellitus with hyperglycemia: Secondary | ICD-10-CM | POA: Diagnosis not present

## 2023-02-04 ENCOUNTER — Ambulatory Visit: Payer: Medicare HMO | Admitting: Internal Medicine

## 2023-02-04 NOTE — Progress Notes (Deleted)
Name: Autumn Valenzuela  Age/ Sex: 63 y.o., female   MRN/ DOB: [id number], 1960/05/16     PCP: Anne Ng, NP   Reason for Endocrinology Evaluation: Type 2 Diabetes Mellitus  Initial Endocrine Consultative Visit: 03/08/2019    PATIENT IDENTIFIER: [name] is a 63 y.o. female with a past medical history of .T2Dm, bipolar disorder,migrane headaches  and dyslipidemia  The patient has followed with Endocrinology clinic since 03/08/2019 for consultative assistance with management of her diabetes.  DIABETIC HISTORY:  Ms. Bartolucci was diagnosed with T2DM in 2018.  She has been tried on oral glycemic agents such as glipizide and Metformin, she is intolerant to higher doses of Metformin.  She has been on insulin since 2018.  Her hemoglobin A1c has ranged from  6.7% in 2018, peaking at 14.6% in 2021   On her initial visit to our clinic she had an A1c of 14.6%, she was on metformin and Lantus. We continued that and started Jardiance. By 09/2020 we provided her with novolog per correction scale   Started Ozempic   Discontinued Basaglar 09/2022 with an A1c of 6.0%   SUBJECTIVE:   During the last visit (09/26/2022): A1c of 6.0 %.     Today (02/04/2023): [name] is here for follow-up on diabetes management. .She checks her  glucose multiple times daily through CGM. She has had multiple episodes of hypoglycemic episodes, she is symptomatic with these episodes.    She continues to follow-up with vascular surgery for claudications, undergoing medical therapy  Has noted with weight loss  Denies nausea or vomiting  Denies constipation or diarrhea     HOME DIABETES REGIMEN:  Metformin 500 mg BID  Jardiance 25 mg daily  Ozempic  2 mg weekly     Statin: Yes ACE-I/ARB: No  CONTINUOUS GLUCOSE MONITORING RECORD INTERPRETATION    Dates of Recording: 8/30-9/12/2022  Sensor description:dexcom  Results statistics:   CGM use % of time 79  Average and SD 95/21  Time in range  96   %  % Time Above 180 0  % Time above 250 4  % Time Below target <1   Glycemic patterns summary: Optimal BG's through the day and night Hyperglycemic episodes n/a  Hypoglycemic episodes occurred during the day and night  Overnight periods: Trends down      DIABETIC COMPLICATIONS: Microvascular complications:  Neuropathy Denies: CKD, retinopathy  Last eye exam: Completed 11/12/2022   Macrovascular complications:   S/P left common femoral endarterectomy with bovine patch on 02/12/2021 Denies: CAD, CVA     HISTORY:  Past Medical History:  Past Medical History:  Diagnosis Date   Allergy    Anemia    Anxiety    Arthritis    Asthma    Back pain    Bipolar disorder (HCC)    CVA (cerebral infarction)    Depression    Diabetes mellitus without complication (HCC)    High cholesterol    Hypertension    Left leg pain    Migraine    Peripheral arterial disease (HCC)    Sleep apnea    does not use a cpap   Stroke (HCC) 2007   no lasting weakness   Past Surgical History:  Past Surgical History:  Procedure Laterality Date   ABDOMINAL AORTOGRAM W/LOWER EXTREMITY N/A 02/08/2021   Procedure: ABDOMINAL AORTOGRAM W/LOWER EXTREMITY;  Surgeon: Cephus Shelling, MD;  [facility];  Service: Cardiovascular;  Laterality: N/A;   ABDOMINAL  HYSTERECTOMY     ANTERIOR LATERAL LUMBAR FUSION WITH PERCUTANEOUS SCREW 1 LEVEL Left 02/11/2018   Procedure: LEFT LATERAL LUMBAR  FOUR-FIVE INTERBODY FUSION WITH INSTRUMENTATION AND ALLOGRAFT;  Surgeon: Estill Bamberg, MD;  Location: [facility];  Service: Orthopedics;  Laterality: Left;  LEFT LATERAL LUMBAR  FOUR-FIVE INTERBODY FUSION WITH INSTRUMENTATION AND ALLOGRAFT   BACK SURGERY     Lumbar fusion L5-S1   DIABETES     ENDARTERECTOMY FEMORAL Left 02/12/2021   Procedure: LEFT COMMON FEMORAL ENDARTERECTOMY WITH BOVINE PATCH;  Surgeon: Cephus Shelling, MD;  Location: [facility];  Service: Vascular;  Laterality: Left;   EYE  SURGERY Bilateral    INGUINAL LYMPH NODE BIOPSY Left 07/25/2021   Procedure: EXCISIONAL BIOPSY LEFT INGUINAL LYMPH NODES;  Surgeon: Abigail Miyamoto, MD;  Location: [facility];  Service: General;  Laterality: Left;   KNEE SURGERY     l 4-l5 status post lateral posterior fusion January 30,2020     NEUROFORAMINAL STENOSIS Left    Severe L4-L5, involving the level above previous fusion.   PARTIAL HYSTERECTOMY     RADICULOPATHY Left    L4, Secondary to an L4-L5 Spondylolisthesis   SHOULDER SURGERY     Social History:  reports that she quit smoking about 7 years ago. Her smoking use included cigarettes. She has never used smokeless tobacco. She reports that she does not currently use alcohol. She reports that she does not currently use drugs after having used the following drugs: Marijuana. Family History:  Family History  Problem Relation Age of Onset   Diabetes Mother    Stroke Father    Cancer Paternal Aunt    Cancer Paternal Aunt    Diabetes Brother    Colon cancer Neg Hx    Esophageal cancer Neg Hx    Rectal cancer Neg Hx    Stomach cancer Neg Hx      HOME MEDICATIONS: Allergies as of 02/04/2023   No Known Allergies      Medication List        Accurate as of February 04, 2023  6:48 AM. If you have any questions, ask your nurse or doctor.          Accu-Chek FastClix Lancets Misc TEST 4 TIMES DAILY. DX E11.9   Accu-Chek Guide Control Liqd 1 each by In Vitro route every 30 (thirty) days. Needs Accu-check guide monitor system. DX. E11.9 What changed: additional instructions   albuterol 108 (90 Base) MCG/ACT inhaler Commonly known as: VENTOLIN HFA INHALE 2 PUFFS INTO THE LUNGS DAILY AS NEEDED FOR WHEEZING OR SHORTNESS OF BREATH   albuterol (2.5 MG/3ML) 0.083% nebulizer solution Commonly known as: PROVENTIL Take 2.5 mg by nebulization every 6 (six) hours as needed for wheezing.   aspirin EC 81 MG tablet Take 1 tablet (81 mg total) by mouth daily at 6  (six) AM. Swallow whole.   Basaglar KwikPen 100 UNIT/ML ADMINISTER 18 UNITS UNDER THE SKIN DAILY Subcutaneous for 75 days   blood glucose meter kit and supplies Dispense based on patient and insurance preference. Use up to four times daily as directed. (FOR ICD-10 E10.9, E11.9).   buPROPion 75 MG tablet Commonly known as: WELLBUTRIN TAKE 1 TABLET(75 MG) BY MOUTH TWICE DAILY   cilostazol 100 MG tablet Commonly known as: PLETAL Take 1 tablet (100 mg total) by mouth 2 (two) times daily before a meal.   Depakote 250 MG DR tablet Generic drug: divalproex 1 tablet Orally Once a day   diclofenac Sodium 1 %  Gel Commonly known as: Voltaren Apply 2 g topically 4 (four) times daily. What changed:  when to take this reasons to take this   Dulcolax 5 MG EC tablet Generic drug: bisacodyl 2 tablets Orally twice a day for 1 days   DULoxetine 60 MG capsule Commonly known as: CYMBALTA TAKE 1 CAPSULE(60 MG) BY MOUTH TWICE DAILY   empagliflozin 25 MG Tabs tablet Commonly known as: Jardiance Take 1 tablet (25 mg total) by mouth daily.   Exforge 5-320 MG tablet Generic drug: amLODipine-valsartan 1 tablet Orally Once a day for 30 day(s)   ferrous sulfate 325 (65 FE) MG tablet Take 1 tablet (325 mg total) by mouth every other day. What changed: when to take this   fluticasone 50 MCG/ACT nasal spray Commonly known as: FLONASE Place 2 sprays into both nostrils daily as needed for allergies.   gabapentin 300 MG capsule Commonly known as: NEURONTIN Take 1 capsule (300 mg total) by mouth 3 (three) times daily.   meloxicam 15 MG tablet Commonly known as: MOBIC 15 mg.   metFORMIN 500 MG 24 hr tablet Commonly known as: GLUCOPHAGE-XR Take 1 tablet (500 mg total) by mouth in the morning and at bedtime.   NovoLOG 100 UNIT/ML injection Generic drug: insulin aspart as directed Injection as needed when BS spikes over 150   OneTouch Verio test strip Generic drug: glucose blood CHECK  UPTO FOUR TIMES DAILY AS DIRECTED   rosuvastatin 40 MG tablet Commonly known as: CRESTOR Take 1 tablet (40 mg total) by mouth at bedtime.   Semaglutide (2 MG/DOSE) 8 MG/3ML Sopn Inject 2 mg as directed once a week.   tiZANidine 4 MG tablet Commonly known as: Zanaflex Take 1 tablet (4 mg total) by mouth 2 (two) times daily as needed for muscle spasms.   Vitamin D3 1.25 MG (50000 UT) Caps Take 1 capsule by mouth once a week.   Zofran 4 MG tablet Generic drug: ondansetron 1 tablet Orally as needed         OBJECTIVE:   Vital Signs: There were no vitals taken for this visit.  Wt Readings from Last 3 Encounters:  12/24/22 170 lb 11.2 oz (77.4 kg)  11/06/22 174 lb 9.6 oz (79.2 kg)  09/26/22 179 lb (81.2 kg)     Exam: General: Pt appears well and is in NAD  Lungs: Clear with good BS bilat   Heart: RRR  Extremities: No pretibial edema.   Neuro: MS is good with appropriate affect, pt is alert and Ox3   DM Foot Exam 09/26/2022    The skin of the feet is intact without sores or ulcerations. The pedal pulses are undetectable  on the left, 2+ on the right  The sensation is intact  to a screening 5.07, 10 gram monofilament bilaterally    DATA REVIEWED:  Lab Results  Component Value Date   HGBA1C 6.0 (A) 09/26/2022   HGBA1C 6.8 (A) 03/25/2022   HGBA1C 5.8 (A) 09/24/2021    Latest Reference Range & Units 06/11/22 09:37  Sodium 135 - 145 mEq/L 141  Potassium 3.5 - 5.1 mEq/L 4.4  Chloride 96 - 112 mEq/L 107  CO2 19 - 32 mEq/L 26  Glucose 70 - 99 mg/dL 82  BUN 6 - 23 mg/dL 9  Creatinine 2.95 - 2.84 mg/dL 1.32  Calcium 8.4 - 44.0 mg/dL 9.2  Alkaline Phosphatase 39 - 117 U/L 48  Albumin 3.5 - 5.2 g/dL 4.0  AST 0 - 37 U/L 11  ALT 0 -  35 U/L 6  Total Protein 6.0 - 8.3 g/dL 7.0  Total Bilirubin 0.2 - 1.2 mg/dL 0.3  GFR >40.98 mL/min 80.46    Latest Reference Range & Units 06/11/22 09:37  Direct LDL mg/dL 11.9    14/78/29 56:21  Left ABI 0.79  Right ABI 1.15       ASSESSMENT / PLAN / RECOMMENDATIONS:   1) Type 2 Diabetes Mellitus, Optimally  controlled , With neuropathic and macrovascular complications - Most recent A1c of 6.0 %. Goal A1c <7.0%.    -A1c is at 6.0%, most likely this is due to recurrent hypoglycemia - She has not needed to use any NovoLog, will discontinue Basaglar and increase Ozempic as below    MEDICATIONS: Increase Ozempic 2 mg weekly STOP Basaglar 14 units daily Continue Metformin 500 mg 1 tablet with breakfast Continue Jardiance 25 mg daily   EDUCATION / INSTRUCTIONS: BG monitoring instructions: Patient is instructed to check her blood sugars 2 times a day, fasting and bedtime as much as possible Call [facility] if: BG persistently  <70 I reviewed the Rule of 15 for the treatment of hypoglycemia in detail with the patient. Literature supplied.    2) Diabetic complications:  Eye: Does not have known diabetic retinopathy.  Neuro/ Feet: Does have known diabetic peripheral neuropathy. Renal: Patient does not have known baseline CKD. She is not an ACEI/ARB at present     F/U in 4 months    Signed electronically by: Lyndle Herrlich, MD  [facility]  [facility] [address] [place], [place] [address] Phone: [phone] FAX: [phone]   CC: Anne Ng, NP [address] [place] [address] Phone: [phone]  Fax: [phone]  Return to Endocrinology clinic as below: Future Appointments  Date Time Provider Department Center  02/04/2023  8:50 AM Kalena Mander, Konrad Dolores, MD [facility] None  05/07/2023 10:00 AM Anne Ng, NP [facility]  07/01/2023  9:00 AM [facility]  07/01/2023  9:40 AM Cephus Shelling, MD [facility]

## 2023-02-07 ENCOUNTER — Encounter: Payer: Self-pay | Admitting: Internal Medicine

## 2023-02-07 ENCOUNTER — Ambulatory Visit (INDEPENDENT_AMBULATORY_CARE_PROVIDER_SITE_OTHER): Payer: Medicare HMO | Admitting: Internal Medicine

## 2023-02-07 VITALS — BP 110/74 | HR 74 | Ht 61.0 in | Wt 175.0 lb

## 2023-02-07 DIAGNOSIS — Z794 Long term (current) use of insulin: Secondary | ICD-10-CM

## 2023-02-07 DIAGNOSIS — E1165 Type 2 diabetes mellitus with hyperglycemia: Secondary | ICD-10-CM

## 2023-02-07 LAB — POCT GLYCOSYLATED HEMOGLOBIN (HGB A1C): Hemoglobin A1C: 6 % — AB (ref 4.0–5.6)

## 2023-02-07 MED ORDER — SEMAGLUTIDE (2 MG/DOSE) 8 MG/3ML ~~LOC~~ SOPN: 2.0000 mg | PEN_INJECTOR | SUBCUTANEOUS | 3 refills | Status: DC

## 2023-02-07 MED ORDER — EMPAGLIFLOZIN 25 MG PO TABS: 25.0000 mg | ORAL_TABLET | Freq: Every day | ORAL | 3 refills | Status: DC

## 2023-02-07 NOTE — Patient Instructions (Addendum)
-   Stop Metformin  - Continue  Ozempic 2 mg weekly  - Continue Jardiance to 25 mg , 1 tablet daily     HOW TO TREAT LOW BLOOD SUGARS (Blood sugar LESS THAN 70 MG/DL) Please follow the RULE OF 15 for the treatment of hypoglycemia treatment (when your (blood sugars are less than 70 mg/dL)   STEP 1: Take 15 grams of carbohydrates when your blood sugar is low, which includes:  3-4 GLUCOSE TABS  OR 3-4 OZ OF JUICE OR REGULAR SODA OR ONE TUBE OF GLUCOSE GEL    STEP 2: RECHECK blood sugar in 15 MINUTES STEP 3: If your blood sugar is still low at the 15 minute recheck --> then, go back to STEP 1 and treat AGAIN with another 15 grams of carbohydrates.

## 2023-02-07 NOTE — Progress Notes (Signed)
Name: Autumn Valenzuela  Age/ Sex: 63 y.o., female   MRN/ DOB: [id number], 12-05-1960     PCP: Anne Ng, NP   Reason for Endocrinology Evaluation: Type 2 Diabetes Mellitus  Initial Endocrine Consultative Visit: 03/08/2019    PATIENT IDENTIFIER: Autumn Valenzuela is a 63 y.o. female with a past medical history of .T2Dm, bipolar disorder,migrane headaches  and dyslipidemia  The patient has followed with Endocrinology clinic since 03/08/2019 for consultative assistance with management of her diabetes.  DIABETIC HISTORY:  Autumn Valenzuela was diagnosed with T2DM in 2018.  She has been tried on oral glycemic agents such as glipizide and Metformin, she is intolerant to higher doses of Metformin.  She has been on insulin since 2018.  Her hemoglobin A1c has ranged from  6.7% in 2018, peaking at 14.6% in 2021   On her initial visit to our clinic she had an A1c of 14.6%, she was on metformin and Lantus. We continued that and started Jardiance. By 09/2020 we provided her with novolog per correction scale   Started Ozempic   Discontinued Basaglar 09/2022 with an A1c of 6.0% Discontinue metformin 01/2023 due to hypoglycemia and A1c of 6.0%    SUBJECTIVE:   During the last visit (09/26/2022): A1c of 6.0 %.     Today (02/07/2023): Autumn Valenzuela is here for follow-up on diabetes management. .She checks her  glucose multiple times daily through CGM. She has had multiple episodes of hypoglycemic episodes, she is symptomatic with these episodes. She does verify with fingersticks with occasional normal BG's  or 60's   She continues to follow-up with vascular surgery for claudications, undergoing medical therapy  Has occasional nausea but no vomiting  Denies constipation but has  diarrhea  that she attributes to gastroenteritis that was contracted from daughter  No genital infections    HOME DIABETES REGIMEN:  Metformin 500 mg BID  Jardiance 25 mg daily  Ozempic  2 mg weekly     Statin:  Yes ACE-I/ARB: No  CONTINUOUS GLUCOSE MONITORING RECORD INTERPRETATION    Dates of Recording: 1/11-1/24/2025  Sensor description:dexcom  Results statistics:   CGM use % of time 68  Average and SD 87/25  Time in range 75 %  % Time Above 180 0  % Time above 250 0  % Time Below target 22   Glycemic patterns summary: BGs are low overnight and optimal during the day  Hypoglycemic episodes occurred during the day and night  Overnight periods: Trends down      DIABETIC COMPLICATIONS: Microvascular complications:  Neuropathy Denies: CKD, retinopathy  Last Valenzuela exam: Completed 11/12/2022   Macrovascular complications:   S/P left common femoral endarterectomy with bovine patch on 02/12/2021 Denies: CAD, CVA     HISTORY:  Past Medical History:  Past Medical History:  Diagnosis Date   Allergy    Anemia    Anxiety    Arthritis    Asthma    Back pain    Bipolar disorder (HCC)    CVA (cerebral infarction)    Depression    Diabetes mellitus without complication (HCC)    High cholesterol    Hypertension    Left leg pain    Migraine    Peripheral arterial disease (HCC)    Sleep apnea    does not use a cpap   Stroke (HCC) 2007   no lasting weakness   Past Surgical History:  Past Surgical History:  Procedure Laterality Date   ABDOMINAL AORTOGRAM W/LOWER  EXTREMITY N/A 02/08/2021   Procedure: ABDOMINAL AORTOGRAM W/LOWER EXTREMITY;  Surgeon: Cephus Shelling, MD;  Location: [facility] INVASIVE CV LAB;  Service: Cardiovascular;  Laterality: N/A;   ABDOMINAL HYSTERECTOMY     ANTERIOR LATERAL LUMBAR FUSION WITH PERCUTANEOUS SCREW 1 LEVEL Left 02/11/2018   Procedure: LEFT LATERAL LUMBAR  FOUR-FIVE INTERBODY FUSION WITH INSTRUMENTATION AND ALLOGRAFT;  Surgeon: Estill Bamberg, MD;  Location: [facility] OR;  Service: Orthopedics;  Laterality: Left;  LEFT LATERAL LUMBAR  FOUR-FIVE INTERBODY FUSION WITH INSTRUMENTATION AND ALLOGRAFT   BACK SURGERY     Lumbar fusion L5-S1   DIABETES      ENDARTERECTOMY FEMORAL Left 02/12/2021   Procedure: LEFT COMMON FEMORAL ENDARTERECTOMY WITH BOVINE PATCH;  Surgeon: Cephus Shelling, MD;  Location: [facility] OR;  Service: Vascular;  Laterality: Left;   Valenzuela SURGERY Bilateral    INGUINAL LYMPH NODE BIOPSY Left 07/25/2021   Procedure: EXCISIONAL BIOPSY LEFT INGUINAL LYMPH NODES;  Surgeon: Abigail Miyamoto, MD;  Location: [facility];  Service: General;  Laterality: Left;   KNEE SURGERY     l 4-l5 status post lateral posterior fusion January 30,2020     NEUROFORAMINAL STENOSIS Left    Severe L4-L5, involving the level above previous fusion.   PARTIAL HYSTERECTOMY     RADICULOPATHY Left    L4, Secondary to an L4-L5 Spondylolisthesis   SHOULDER SURGERY     Social History:  reports that she quit smoking about 7 years ago. Her smoking use included cigarettes. She has never used smokeless tobacco. She reports that she does not currently use alcohol. She reports that she does not currently use drugs after having used the following drugs: Marijuana. Family History:  Family History  Problem Relation Age of Onset   Diabetes Mother    Stroke Father    Cancer Paternal Aunt    Cancer Paternal Aunt    Diabetes Brother    Colon cancer Neg Hx    Esophageal cancer Neg Hx    Rectal cancer Neg Hx    Stomach cancer Neg Hx      HOME MEDICATIONS: Allergies as of 02/07/2023   No Known Allergies      Medication List        Accurate as of February 07, 2023  8:51 AM. If you have any questions, ask your nurse or doctor.          Accu-Chek FastClix Lancets Misc TEST 4 TIMES DAILY. DX E11.9   Accu-Chek Guide Control Liqd 1 each by In Vitro route every 30 (thirty) days. Needs Accu-check guide monitor system. DX. E11.9 What changed: additional instructions   albuterol 108 (90 Base) MCG/ACT inhaler Commonly known as: VENTOLIN HFA INHALE 2 PUFFS INTO THE LUNGS DAILY AS NEEDED FOR WHEEZING OR SHORTNESS OF BREATH   albuterol (2.5 MG/3ML)  0.083% nebulizer solution Commonly known as: PROVENTIL Take 2.5 mg by nebulization every 6 (six) hours as needed for wheezing.   aspirin EC 81 MG tablet Take 1 tablet (81 mg total) by mouth daily at 6 (six) AM. Swallow whole.   Basaglar KwikPen 100 UNIT/ML ADMINISTER 18 UNITS UNDER THE SKIN DAILY Subcutaneous for 75 days   blood glucose meter kit and supplies Dispense based on patient and insurance preference. Use up to four times daily as directed. (FOR ICD-10 E10.9, E11.9).   buPROPion 75 MG tablet Commonly known as: WELLBUTRIN TAKE 1 TABLET(75 MG) BY MOUTH TWICE DAILY   cilostazol 100 MG tablet Commonly known as: PLETAL Take 1 tablet (100 mg  total) by mouth 2 (two) times daily before a meal.   Depakote 250 MG DR tablet Generic drug: divalproex 1 tablet Orally Once a day   diclofenac Sodium 1 % Gel Commonly known as: Voltaren Apply 2 g topically 4 (four) times daily. What changed:  when to take this reasons to take this   Dulcolax 5 MG EC tablet Generic drug: bisacodyl 2 tablets Orally twice a day for 1 days   DULoxetine 60 MG capsule Commonly known as: CYMBALTA TAKE 1 CAPSULE(60 MG) BY MOUTH TWICE DAILY   empagliflozin 25 MG Tabs tablet Commonly known as: Jardiance Take 1 tablet (25 mg total) by mouth daily.   Exforge 5-320 MG tablet Generic drug: amLODipine-valsartan 1 tablet Orally Once a day for 30 day(s)   ferrous sulfate 325 (65 FE) MG tablet Take 1 tablet (325 mg total) by mouth every other day. What changed: when to take this   fluticasone 50 MCG/ACT nasal spray Commonly known as: FLONASE Place 2 sprays into both nostrils daily as needed for allergies.   gabapentin 300 MG capsule Commonly known as: NEURONTIN Take 1 capsule (300 mg total) by mouth 3 (three) times daily.   meloxicam 15 MG tablet Commonly known as: MOBIC 15 mg.   metFORMIN 500 MG 24 hr tablet Commonly known as: GLUCOPHAGE-XR Take 1 tablet (500 mg total) by mouth in the morning  and at bedtime.   NovoLOG 100 UNIT/ML injection Generic drug: insulin aspart as directed Injection as needed when BS spikes over 150   OneTouch Verio test strip Generic drug: glucose blood CHECK UPTO FOUR TIMES DAILY AS DIRECTED   rosuvastatin 40 MG tablet Commonly known as: CRESTOR Take 1 tablet (40 mg total) by mouth at bedtime.   Semaglutide (2 MG/DOSE) 8 MG/3ML Sopn Inject 2 mg as directed once a week.   tiZANidine 4 MG tablet Commonly known as: Zanaflex Take 1 tablet (4 mg total) by mouth 2 (two) times daily as needed for muscle spasms.   Vitamin D3 1.25 MG (50000 UT) Caps Take 1 capsule by mouth once a week.   Zofran 4 MG tablet Generic drug: ondansetron 1 tablet Orally as needed         OBJECTIVE:   Vital Signs: BP 110/74 (BP Location: Left Arm, Patient Position: Sitting, Cuff Size: Normal)   Pulse 74   Ht 5\' 1"  (1.549 m)   Wt 175 lb (79.4 kg)   SpO2 99%   BMI 33.07 kg/m   Wt Readings from Last 3 Encounters:  02/07/23 175 lb (79.4 kg)  12/24/22 170 lb 11.2 oz (77.4 kg)  11/06/22 174 lb 9.6 oz (79.2 kg)     Exam: General: Pt appears well and is in NAD  Lungs: Clear with good BS bilat   Heart: RRR  Extremities: No pretibial edema.   Neuro: MS is good with appropriate affect, pt is alert and Ox3   DM Foot Exam 09/26/2022    The skin of the feet is intact without sores or ulcerations. The pedal pulses are undetectable  on the left, 2+ on the right  The sensation is intact  to a screening 5.07, 10 gram monofilament bilaterally    DATA REVIEWED:  Lab Results  Component Value Date   HGBA1C 6.0 (A) 02/07/2023   HGBA1C 6.0 (A) 09/26/2022   HGBA1C 6.8 (A) 03/25/2022    Latest Reference Range & Units 06/11/22 09:37  Sodium 135 - 145 mEq/L 141  Potassium 3.5 - 5.1 mEq/L 4.4  Chloride 96 -  112 mEq/L 107  CO2 19 - 32 mEq/L 26  Glucose 70 - 99 mg/dL 82  BUN 6 - 23 mg/dL 9  Creatinine 1.61 - 0.96 mg/dL 0.45  Calcium 8.4 - 40.9 mg/dL 9.2   Alkaline Phosphatase 39 - 117 U/L 48  Albumin 3.5 - 5.2 g/dL 4.0  AST 0 - 37 U/L 11  ALT 0 - 35 U/L 6  Total Protein 6.0 - 8.3 g/dL 7.0  Total Bilirubin 0.2 - 1.2 mg/dL 0.3  GFR >81.19 mL/min 80.46    Latest Reference Range & Units 06/11/22 09:37  Direct LDL mg/dL 14.7    82/95/62 13:08  Left ABI 0.79  Right ABI 1.15      ASSESSMENT / PLAN / RECOMMENDATIONS:   1) Type 2 Diabetes Mellitus, Optimally  controlled , With neuropathic and macrovascular complications - Most recent A1c of 6.0 %. Goal A1c <7.0%.    -A1c continues to be optimal -She has been noted with hypoglycemia on Dexcom download, the patient does verify some of these episodes with the fingersticks and notes that fingerstick readings are higher than the Dexcom but she also endorses BG readings in the 60s. -I have recommended discontinuing metformin at this time  MEDICATIONS: Stop metformin Continue Ozempic 2 mg weekly Continue Jardiance 25 mg daily   EDUCATION / INSTRUCTIONS: BG monitoring instructions: Patient is instructed to check her blood sugars 2 times a day, fasting and bedtime as much as possible Call [facility] if: BG persistently  <70 I reviewed the Rule of 15 for the treatment of hypoglycemia in detail with the patient. Literature supplied.    2) Diabetic complications:  Valenzuela: Does not have known diabetic retinopathy.  Neuro/ Feet: Does have known diabetic peripheral neuropathy. Renal: Patient does not have known baseline CKD. She is not an ACEI/ARB at present     F/U in 6 months    Signed electronically by: Lyndle Herrlich, MD  [facility]  [facility] [address] [place] [place], [place] [address] Phone: [phone] FAX: [phone]   CC: Anne Ng, NP [address] [place] [address] Phone: [phone]  Fax: [phone]  Return to Endocrinology clinic as below: Future Appointments  Date Time  Provider Department Center  05/07/2023 10:00 AM Nche, Bonna Gains, NP [facility]  07/01/2023  9:00 AM [facility]  07/01/2023  9:40 AM Cephus Shelling, MD [facility]

## 2023-02-11 DIAGNOSIS — E1142 Type 2 diabetes mellitus with diabetic polyneuropathy: Secondary | ICD-10-CM | POA: Diagnosis not present

## 2023-02-11 DIAGNOSIS — E1165 Type 2 diabetes mellitus with hyperglycemia: Secondary | ICD-10-CM | POA: Diagnosis not present

## 2023-02-23 ENCOUNTER — Other Ambulatory Visit: Payer: Self-pay

## 2023-02-23 ENCOUNTER — Emergency Department (HOSPITAL_BASED_OUTPATIENT_CLINIC_OR_DEPARTMENT_OTHER): Payer: Medicare HMO | Admitting: Radiology

## 2023-02-23 ENCOUNTER — Encounter (HOSPITAL_BASED_OUTPATIENT_CLINIC_OR_DEPARTMENT_OTHER): Payer: Self-pay | Admitting: Emergency Medicine

## 2023-02-23 ENCOUNTER — Emergency Department (HOSPITAL_BASED_OUTPATIENT_CLINIC_OR_DEPARTMENT_OTHER)
Admission: EM | Admit: 2023-02-23 | Discharge: 2023-02-23 | Disposition: A | Discharge status: Home / Self Care | Payer: Medicare HMO | Attending: Emergency Medicine | Admitting: Emergency Medicine

## 2023-02-23 DIAGNOSIS — E119 Type 2 diabetes mellitus without complications: Secondary | ICD-10-CM | POA: Diagnosis not present

## 2023-02-23 DIAGNOSIS — M79641 Pain in right hand: Secondary | ICD-10-CM

## 2023-02-23 DIAGNOSIS — Z7984 Long term (current) use of oral hypoglycemic drugs: Secondary | ICD-10-CM | POA: Diagnosis not present

## 2023-02-23 DIAGNOSIS — Z794 Long term (current) use of insulin: Secondary | ICD-10-CM | POA: Insufficient documentation

## 2023-02-23 DIAGNOSIS — I10 Essential (primary) hypertension: Secondary | ICD-10-CM | POA: Insufficient documentation

## 2023-02-23 DIAGNOSIS — M19041 Primary osteoarthritis, right hand: Secondary | ICD-10-CM | POA: Diagnosis not present

## 2023-02-23 DIAGNOSIS — Z79899 Other long term (current) drug therapy: Secondary | ICD-10-CM | POA: Diagnosis not present

## 2023-02-23 MED ORDER — OXYCODONE HCL 5 MG PO TABS: 5.0000 mg | ORAL_TABLET | Freq: Four times a day (QID) | ORAL | 0 refills | Status: AC | PRN

## 2023-02-23 MED ORDER — DICLOFENAC SODIUM 1 % EX GEL: 2.0000 g | Freq: Four times a day (QID) | CUTANEOUS | 0 refills | Status: AC

## 2023-02-23 NOTE — ED Triage Notes (Signed)
 C/o left hand and first finger pain. Denies known injuries. Does not appear swollen.

## 2023-02-23 NOTE — ED Provider Notes (Signed)
 [place] EMERGENCY DEPARTMENT AT [facility] Provider Note   CSN: [id number] Arrival date & time: 02/23/23  1000     History  Chief Complaint  Patient presents with   Hand Pain    Autumn Valenzuela is a 63 y.o. female.  Patient here with right hand pain.  History of the same.  Been overusing the hand last few days a lot of manual labor stuff.  Started noticing swelling around the wrist with discomfort.  Movement makes it worse.  Rest makes it better.  No fever or chills.  History of arthritis.  History of stroke depression hypertension.  The history is provided by the patient.       Home Medications Prior to Admission medications   Medication Sig Start Date End Date Taking? Authorizing Provider  diclofenac  Sodium (VOLTAREN ) 1 % GEL Apply 2 g topically 4 (four) times daily. 02/23/23  Yes Vaishali Baise, DO  oxyCODONE  (ROXICODONE ) 5 MG immediate release tablet Take 1 tablet (5 mg total) by mouth every 6 (six) hours as needed for up to 10 doses. 02/23/23  Yes Todrick Siedschlag, DO  Accu-Chek FastClix Lancets MISC TEST 4 TIMES DAILY. DX E11.9 12/06/20   Nche, Roselie Rockford, NP  albuterol  (PROVENTIL ) (2.5 MG/3ML) 0.083% nebulizer solution Take 2.5 mg by nebulization every 6 (six) hours as needed for wheezing.    [provider]  albuterol  (VENTOLIN  HFA) 108 (90 Base) MCG/ACT inhaler INHALE 2 PUFFS INTO THE LUNGS DAILY AS NEEDED FOR WHEEZING OR SHORTNESS OF BREATH 07/11/22   Nche, Roselie Rockford, NP  amLODipine -valsartan  (EXFORGE ) 5-320 MG tablet 1 tablet Orally Once a day for 30 day(s)    [provider]  aspirin  EC 81 MG EC tablet Take 1 tablet (81 mg total) by mouth daily at 6 (six) AM. Swallow whole. 02/14/21   Baglia, Corrina, PA-C  Blood Glucose Calibration (ACCU-CHEK GUIDE CONTROL) LIQD 1 each by In Vitro route every 30 (thirty) days. Needs Accu-check guide monitor system. DX. E11.9 Patient taking differently: 1 each by In Vitro route every 30 (thirty) days. Needs  Accu-check guide monitor system. DX. E11.9 2 times daily 07/15/18   Avram Barnie NOVAK, FNP  blood glucose meter kit and supplies Dispense based on patient and insurance preference. Use up to four times daily as directed. (FOR ICD-10 E10.9, E11.9). 01/19/21   Shamleffer, Ibtehal Jaralla, MD  buPROPion  (WELLBUTRIN ) 75 MG tablet TAKE 1 TABLET(75 MG) BY MOUTH TWICE DAILY 05/13/22   Nche, Roselie Rockford, NP  Cholecalciferol  (VITAMIN D3) 1.25 MG (50000 UT) CAPS Take 1 capsule by mouth once a week. 02/10/20   Cleatus Arlyss RAMAN, MD  cilostazol  (PLETAL ) 100 MG tablet Take 1 tablet (100 mg total) by mouth 2 (two) times daily before a meal. 12/24/22   Gretta Lonni PARAS, MD  divalproex (DEPAKOTE) 250 MG DR tablet 1 tablet Orally Once a day    [provider]  DULCOLAX 5 MG EC tablet 2 tablets Orally twice a day for 1 days 10/31/22   [provider]  DULoxetine  (CYMBALTA ) 60 MG capsule TAKE 1 CAPSULE(60 MG) BY MOUTH TWICE DAILY 08/12/22   Nche, Roselie Rockford, NP  empagliflozin  (JARDIANCE ) 25 MG TABS tablet Take 1 tablet (25 mg total) by mouth daily. 02/07/23   Shamleffer, Donell Cardinal, MD  ferrous sulfate  325 (65 FE) MG tablet Take 1 tablet (325 mg total) by mouth every other day. Patient taking differently: Take 325 mg by mouth daily. 02/08/19   Avram Barnie NOVAK, FNP  fluticasone  (  FLONASE ) 50 MCG/ACT nasal spray Place 2 sprays into both nostrils daily as needed for allergies. 09/22/19   Avram Barnie NOVAK, FNP  gabapentin  (NEURONTIN ) 300 MG capsule Take 1 capsule (300 mg total) by mouth 3 (three) times daily. 05/07/22   Nche, Roselie Rockford, NP  glucose blood (ONETOUCH VERIO) test strip CHECK UPTO FOUR TIMES DAILY AS DIRECTED 02/19/22   Shamleffer, Ibtehal Jaralla, MD  meloxicam  (MOBIC ) 15 MG tablet 15 mg.    [provider]  ondansetron  (ZOFRAN ) 4 MG tablet 1 tablet Orally as needed    [provider]  rosuvastatin  (CRESTOR ) 40 MG tablet Take 1 tablet (40 mg total) by mouth at  bedtime. 05/07/22   Nche, Roselie Rockford, NP  Semaglutide , 2 MG/DOSE, 8 MG/3ML SOPN Inject 2 mg as directed once a week. 02/07/23   Shamleffer, Ibtehal Jaralla, MD  tiZANidine  (ZANAFLEX ) 4 MG tablet Take 1 tablet (4 mg total) by mouth 2 (two) times daily as needed for muscle spasms. 09/19/22   Gretta Lonni PARAS, MD      Allergies    Patient has no known allergies.    Review of Systems   Review of Systems  Physical Exam Updated Vital Signs BP (!) 149/113 (BP Location: Left Arm)   Pulse 87   Temp 97.7 F (36.5 C)   Resp 19   Ht 5' 1 (1.549 m)   Wt 77.1 kg   SpO2 100%   BMI 32.12 kg/m  Physical Exam Vitals and nursing note reviewed.  Constitutional:      General: She is not in acute distress.    Appearance: She is well-developed. She is not ill-appearing.  HENT:     Head: Normocephalic and atraumatic.  Eyes:     Conjunctiva/sclera: Conjunctivae normal.  Cardiovascular:     Rate and Rhythm: Normal rate and regular rhythm.     Pulses: Normal pulses.     Heart sounds: No murmur heard. Pulmonary:     Effort: Pulmonary effort is normal. No respiratory distress.     Breath sounds: Normal breath sounds.  Abdominal:     Palpations: Abdomen is soft.     Tenderness: There is no abdominal tenderness.  Musculoskeletal:        General: Swelling and tenderness present.     Cervical back: Neck supple.     Comments: Tenderness and some mild swelling to the wrist area on the right hand but good range of motion of the wrist without major discomfort no warmth or redness or purulent drainage or fluctuance  Skin:    General: Skin is warm and dry.     Capillary Refill: Capillary refill takes less than 2 seconds.  Neurological:     General: No focal deficit present.     Mental Status: She is alert.     Sensory: No sensory deficit.     Motor: No weakness.  Psychiatric:        Mood and Affect: Mood normal.     ED Results / Procedures / Treatments   Labs (all labs ordered are listed,  but only abnormal results are displayed) Labs Reviewed - No data to display  EKG None  Radiology DG Hand Complete Right Result Date: 02/23/2023 CLINICAL DATA:  Right hand pain.  No known injury. EXAM: RIGHT HAND - COMPLETE 3+ VIEW COMPARISON:  None Available. FINDINGS: There is no evidence of fracture or dislocation. There is no evidence of arthropathy or other focal bone abnormality. Soft tissues are unremarkable. IMPRESSION: Negative. Electronically Signed  By: Norleen DELENA Kil M.D.   On: 02/23/2023 10:34    Procedures Procedures    Medications Ordered in ED Medications - No data to display  ED Course/ Medical Decision Making/ A&P                                 Medical Decision Making Amount and/or Complexity of Data Reviewed Radiology: ordered.  Risk Prescription drug management.   LENISHA LACAP is here with right hand pain.  History of arthritis diabetes hypertension.  Overall appears to have some tenderness of the right wrist and swelling.  There is no warmth or erythema.  Scott good range of motion of the wrist without much discomfort.  I do not have any concern for septic joint.  X-ray showed no fracture or malalignment per radiology report.  She is neurovascular neuromuscular intact.  Differential diagnosis likely inflammatory process.  Does not seem to be infectious or traumatic given history and workup.  Will place in the immobilizer and recommend Voltaren  ice Roxicodone  for breakthrough pain.  Discharged in good condition.  Understands return precautions.  Follow-up with her primary care doctor and orthopedic doctor.  This chart was dictated using voice recognition software.  Despite best efforts to proofread,  errors can occur which can change the documentation meaning.         Final Clinical Impression(s) / ED Diagnoses Final diagnoses:  Pain of right hand    Rx / DC Orders ED Discharge Orders          Ordered    diclofenac  Sodium (VOLTAREN ) 1 % GEL  4  times daily        02/23/23 1217    oxyCODONE  (ROXICODONE ) 5 MG immediate release tablet  Every 6 hours PRN        02/23/23 1217              Ruthe Cornet, DO 02/23/23 1232

## 2023-02-23 NOTE — Discharge Instructions (Signed)
 Use splint for comfort.  Use Voltaren  gel if you are able to afford this medicine.  Take Roxicodone  for breakthrough pain this is a narcotic pain medicine so please do not mix with alcohol drugs or dangerous activities including driving.  Recommend ice Tylenol  follow-up with primary care doctor and orthopedic doctor.

## 2023-03-12 DIAGNOSIS — M19031 Primary osteoarthritis, right wrist: Secondary | ICD-10-CM | POA: Diagnosis not present

## 2023-04-24 ENCOUNTER — Other Ambulatory Visit: Payer: Self-pay | Admitting: Nurse Practitioner

## 2023-04-24 DIAGNOSIS — E782 Mixed hyperlipidemia: Secondary | ICD-10-CM

## 2023-05-07 ENCOUNTER — Ambulatory Visit: Payer: Medicare HMO | Admitting: Nurse Practitioner

## 2023-05-12 DIAGNOSIS — E1142 Type 2 diabetes mellitus with diabetic polyneuropathy: Secondary | ICD-10-CM | POA: Diagnosis not present

## 2023-05-12 DIAGNOSIS — E1165 Type 2 diabetes mellitus with hyperglycemia: Secondary | ICD-10-CM | POA: Diagnosis not present

## 2023-06-30 NOTE — Progress Notes (Deleted)
 Patient name: Autumn Valenzuela MRN: [id number] DOB: 01/13/1961 Sex: female  REASON FOR VISIT: 6 month follow-up , left common femoral re-occlusion  HPI: Autumn Valenzuela is a 63 y.o. female with hx HTN, HLD, DM, PAD that presents for 6 month follow-up of her PAD.  She previously underwent a left common femoral endarterectomy with bovine patch on 02/12/2021 for lifestyle limiting claudication and did well after surgery.  Had evidence of stenosis in the left iliac as well as the common femoral artery with new symptoms.  I sent her for CTA that showed her left common femoral artery occluded on 07/04/22 after she had recurrent symptoms.  We have been trying to manage this conservatively as she had very poor durability with the first operation and has small arteries.    Today her left leg is doing about the same.  States the Pletal  has helped a lot.  No tobacco.    Past Medical History:  Diagnosis Date   Allergy    Anemia    Anxiety    Arthritis    Asthma    Back pain    Bipolar disorder (HCC)    CVA (cerebral infarction)    Depression    Diabetes mellitus without complication (HCC)    High cholesterol    Hypertension    Left leg pain    Migraine    Peripheral arterial disease (HCC)    Sleep apnea    does not use a cpap   Stroke (HCC) 2007   no lasting weakness    Past Surgical History:  Procedure Laterality Date   ABDOMINAL AORTOGRAM W/LOWER EXTREMITY N/A 02/08/2021   Procedure: ABDOMINAL AORTOGRAM W/LOWER EXTREMITY;  Surgeon: Young Hensen, MD;  Location: [facility];  Service: Cardiovascular;  Laterality: N/A;   ABDOMINAL HYSTERECTOMY     ANTERIOR LATERAL LUMBAR FUSION WITH PERCUTANEOUS SCREW 1 LEVEL Left 02/11/2018   Procedure: LEFT LATERAL LUMBAR  FOUR-FIVE INTERBODY FUSION WITH INSTRUMENTATION AND ALLOGRAFT;  Surgeon: Virl Grimes, MD;  Location: [facility];  Service: Orthopedics;  Laterality: Left;  LEFT LATERAL LUMBAR  FOUR-FIVE INTERBODY FUSION WITH INSTRUMENTATION AND  ALLOGRAFT   BACK SURGERY     Lumbar fusion L5-S1   DIABETES     ENDARTERECTOMY FEMORAL Left 02/12/2021   Procedure: LEFT COMMON FEMORAL ENDARTERECTOMY WITH BOVINE PATCH;  Surgeon: Young Hensen, MD;  Location: [facility];  Service: Vascular;  Laterality: Left;   EYE SURGERY Bilateral    INGUINAL LYMPH NODE BIOPSY Left 07/25/2021   Procedure: EXCISIONAL BIOPSY LEFT INGUINAL LYMPH NODES;  Surgeon: Oza Blumenthal, MD;  Location: [facility];  Service: General;  Laterality: Left;   KNEE SURGERY     l 4-l5 status post lateral posterior fusion January 30,2020     NEUROFORAMINAL STENOSIS Left    Severe L4-L5, involving the level above previous fusion.   PARTIAL HYSTERECTOMY     RADICULOPATHY Left    L4, Secondary to an L4-L5 Spondylolisthesis   SHOULDER SURGERY      Family History  Problem Relation Age of Onset   Diabetes Mother    Stroke Father    Cancer Paternal Aunt    Cancer Paternal Aunt    Diabetes Brother    Colon cancer Neg Hx    Esophageal cancer Neg Hx    Rectal cancer Neg Hx    Stomach cancer Neg Hx     SOCIAL HISTORY: Social History   Tobacco Use   Smoking status: Former  Current packs/day: 0.00    Types: Cigarettes    Quit date: 12/13/2015    Years since quitting: 7.5   Smokeless tobacco: Never  Substance Use Topics   Alcohol use: Not Currently    Comment: Occas    No Known Allergies  Current Outpatient Medications  Medication Sig Dispense Refill   Accu-Chek FastClix Lancets MISC TEST 4 TIMES DAILY. DX E11.9 306 each 3   albuterol  (PROVENTIL ) (2.5 MG/3ML) 0.083% nebulizer solution Take 2.5 mg by nebulization every 6 (six) hours as needed for wheezing.     albuterol  (VENTOLIN  HFA) 108 (90 Base) MCG/ACT inhaler INHALE 2 PUFFS INTO THE LUNGS DAILY AS NEEDED FOR WHEEZING OR SHORTNESS OF BREATH 18 g 0   amLODipine -valsartan  (EXFORGE ) 5-320 MG tablet 1 tablet Orally Once a day for 30 day(s)     aspirin  EC 81 MG EC tablet Take 1 tablet (81 mg  total) by mouth daily at 6 (six) AM. Swallow whole. 30 tablet 11   Blood Glucose Calibration (ACCU-CHEK GUIDE CONTROL) LIQD 1 each by In Vitro route every 30 (thirty) days. Needs Accu-check guide monitor system. DX. E11.9 (Patient taking differently: 1 each by In Vitro route every 30 (thirty) days. Needs Accu-check guide monitor system. DX. E11.9 2 times daily) 3 each 3   blood glucose meter kit and supplies Dispense based on patient and insurance preference. Use up to four times daily as directed. (FOR ICD-10 E10.9, E11.9). 1 each 0   buPROPion  (WELLBUTRIN ) 75 MG tablet TAKE 1 TABLET(75 MG) BY MOUTH TWICE DAILY 180 tablet 3   Cholecalciferol  (VITAMIN D3) 1.25 MG (50000 UT) CAPS Take 1 capsule by mouth once a week. 4 capsule 0   cilostazol  (PLETAL ) 100 MG tablet Take 1 tablet (100 mg total) by mouth 2 (two) times daily before a meal. 60 tablet 11   diclofenac  Sodium (VOLTAREN ) 1 % GEL Apply 2 g topically 4 (four) times daily. 50 g 0   divalproex (DEPAKOTE) 250 MG DR tablet 1 tablet Orally Once a day     DULCOLAX 5 MG EC tablet 2 tablets Orally twice a day for 1 days     DULoxetine  (CYMBALTA ) 60 MG capsule TAKE 1 CAPSULE(60 MG) BY MOUTH TWICE DAILY 180 capsule 3   empagliflozin  (JARDIANCE ) 25 MG TABS tablet Take 1 tablet (25 mg total) by mouth daily. 90 tablet 3   ferrous sulfate  325 (65 FE) MG tablet Take 1 tablet (325 mg total) by mouth every other day. (Patient taking differently: Take 325 mg by mouth daily.) 45 tablet 3   fluticasone  (FLONASE ) 50 MCG/ACT nasal spray Place 2 sprays into both nostrils daily as needed for allergies. 16 g 5   gabapentin  (NEURONTIN ) 300 MG capsule Take 1 capsule (300 mg total) by mouth 3 (three) times daily. 270 capsule 3   glucose blood (ONETOUCH VERIO) test strip CHECK UPTO FOUR TIMES DAILY AS DIRECTED 100 strip 5   meloxicam  (MOBIC ) 15 MG tablet 15 mg.     ondansetron  (ZOFRAN ) 4 MG tablet 1 tablet Orally as needed     oxyCODONE  (ROXICODONE ) 5 MG immediate release  tablet Take 1 tablet (5 mg total) by mouth every 6 (six) hours as needed for up to 10 doses. 10 tablet 0   rosuvastatin  (CRESTOR ) 40 MG tablet TAKE 1 TABLET(40 MG) BY MOUTH AT BEDTIME 90 tablet 3   Semaglutide , 2 MG/DOSE, 8 MG/3ML SOPN Inject 2 mg as directed once a week. 9 mL 3   tiZANidine  (ZANAFLEX ) 4 MG tablet  Take 1 tablet (4 mg total) by mouth 2 (two) times daily as needed for muscle spasms. 30 tablet 5   No current facility-administered medications for this visit.    REVIEW OF SYSTEMS:  [X]  denotes positive finding, [ ]  denotes negative finding Cardiac  Comments:  Chest pain or chest pressure:    Shortness of breath upon exertion:    Short of breath when lying flat:    Irregular heart rhythm:        Vascular    Pain in calf, thigh, or hip brought on by ambulation: x Left leg  Pain in feet at night that wakes you up from your sleep:     Blood clot in your veins:    Leg swelling:         Pulmonary    Oxygen at home:    Productive cough:     Wheezing:         Neurologic    Sudden weakness in arms or legs:     Sudden numbness in arms or legs:     Sudden onset of difficulty speaking or slurred speech:    Temporary loss of vision in one eye:     Problems with dizziness:         Gastrointestinal    Blood in stool:     Vomited blood:         Genitourinary    Burning when urinating:     Blood in urine:        Psychiatric    Major depression:         Hematologic    Bleeding problems:    Problems with blood clotting too easily:        Skin    Rashes or ulcers:        Constitutional    Fever or chills:      PHYSICAL EXAM: There were no vitals filed for this visit.    GENERAL: The patient is a well-nourished female, in no acute distress. The vital signs are documented above. CARDIAC: There is a regular rate and rhythm.  VASCULAR:  Right femoral pulse palpable Left femoral pulse nonpalpable Right DP palpable Left DP/PT monophasic but brisk No lower  extremity tissue loss  DATA:   ABIs today (previously were 1.15 on the right triphasic and 0.79 on the left monophasic)  CTA reviewed from 07/04/2022 with total occlusion of the left common femoral artery.  Assessment/Plan:  63 y.o. female with hx HTN, HLD, DM, PAD that presents for 48-month follow-up for her recurrent left leg symptoms.  She previously underwent a left common femoral endarterectomy with bovine patch on 02/12/2021 for lifestyle limiting claudication.  She did well after surgery but unfortunately her common femoral reoccluded in June 2024.  I have discussed my concern for redo endarterectomy with small arteries given the poor durability with the initial operation.  We are trying to pursue medical therapy for now with walking, Pletal , smoking cessation.  Fortunately her symptoms are stable.  She has seen improvement with Pletal .  She is going to the gym and walking around the track for at least 3 laps.  I am very encouraged with her efforts at medical therapy.      Young Hensen, MD [facility] Office: [phone]

## 2023-07-01 ENCOUNTER — Ambulatory Visit: Payer: Medicare HMO | Attending: Vascular Surgery | Admitting: Vascular Surgery

## 2023-07-01 ENCOUNTER — Ambulatory Visit (HOSPITAL_COMMUNITY): Admission: RE | Admit: 2023-07-01 | Payer: Medicare HMO | Source: Ambulatory Visit

## 2023-07-03 NOTE — Progress Notes (Signed)
   07/03/2023  Patient ID: Autumn Valenzuela, female   DOB: 07-25-1960, 63 y.o.   MRN: [id number]  This patient is appearing on a report for being at risk of failing the adherence measure for cholesterol (statin) medications this calendar year.   Medication: rosuvastatin  40mg  daily  Last fill date: 01/27/2023 for 90 day supply  MyChart message sent to patient.  Linn Rich, PharmD, DPLA

## 2023-08-01 ENCOUNTER — Other Ambulatory Visit: Payer: Self-pay | Admitting: Nurse Practitioner

## 2023-08-01 DIAGNOSIS — F3175 Bipolar disorder, in partial remission, most recent episode depressed: Secondary | ICD-10-CM

## 2023-08-05 ENCOUNTER — Other Ambulatory Visit: Payer: Self-pay | Admitting: Nurse Practitioner

## 2023-08-05 DIAGNOSIS — Z1231 Encounter for screening mammogram for malignant neoplasm of breast: Secondary | ICD-10-CM

## 2023-08-07 ENCOUNTER — Ambulatory Visit: Payer: Medicare HMO | Admitting: Internal Medicine

## 2023-08-07 DIAGNOSIS — Z833 Family history of diabetes mellitus: Secondary | ICD-10-CM | POA: Diagnosis not present

## 2023-08-07 DIAGNOSIS — E785 Hyperlipidemia, unspecified: Secondary | ICD-10-CM | POA: Diagnosis not present

## 2023-08-07 DIAGNOSIS — Z008 Encounter for other general examination: Secondary | ICD-10-CM | POA: Diagnosis not present

## 2023-08-07 DIAGNOSIS — E1151 Type 2 diabetes mellitus with diabetic peripheral angiopathy without gangrene: Secondary | ICD-10-CM | POA: Diagnosis not present

## 2023-08-07 DIAGNOSIS — Z8249 Family history of ischemic heart disease and other diseases of the circulatory system: Secondary | ICD-10-CM | POA: Diagnosis not present

## 2023-08-07 DIAGNOSIS — F319 Bipolar disorder, unspecified: Secondary | ICD-10-CM | POA: Diagnosis not present

## 2023-08-07 DIAGNOSIS — F419 Anxiety disorder, unspecified: Secondary | ICD-10-CM | POA: Diagnosis not present

## 2023-08-07 DIAGNOSIS — I70223 Atherosclerosis of native arteries of extremities with rest pain, bilateral legs: Secondary | ICD-10-CM | POA: Diagnosis not present

## 2023-08-07 DIAGNOSIS — M1990 Unspecified osteoarthritis, unspecified site: Secondary | ICD-10-CM | POA: Diagnosis not present

## 2023-08-07 DIAGNOSIS — E669 Obesity, unspecified: Secondary | ICD-10-CM | POA: Diagnosis not present

## 2023-08-07 DIAGNOSIS — Z87891 Personal history of nicotine dependence: Secondary | ICD-10-CM | POA: Diagnosis not present

## 2023-08-07 DIAGNOSIS — E1142 Type 2 diabetes mellitus with diabetic polyneuropathy: Secondary | ICD-10-CM | POA: Diagnosis not present

## 2023-08-07 DIAGNOSIS — I700 Atherosclerosis of aorta: Secondary | ICD-10-CM | POA: Diagnosis not present

## 2023-08-08 ENCOUNTER — Other Ambulatory Visit: Payer: Self-pay | Admitting: Nurse Practitioner

## 2023-08-08 DIAGNOSIS — F3175 Bipolar disorder, in partial remission, most recent episode depressed: Secondary | ICD-10-CM

## 2023-08-08 NOTE — Telephone Encounter (Signed)
 Medication: Bupropion  (Wellbutrin ) 75 mg  Directions: Take 1 tablet by mouth 2 times a day  Last given: 05/13/22 Number refills: 3 Last o/v: 11/06/22 Follow up: 6 month f/u-05/07/23 patient canceled  Labs:

## 2023-08-13 ENCOUNTER — Ambulatory Visit: Admitting: Internal Medicine

## 2023-08-13 ENCOUNTER — Encounter: Payer: Self-pay | Admitting: Internal Medicine

## 2023-08-13 NOTE — Progress Notes (Deleted)
 Name: SONORA CATLIN  Age/ Sex: 63 y.o., female   MRN/ DOB: [id number], 1960/01/19     PCP: Katheen Roselie Rockford, NP   Reason for Endocrinology Evaluation: Type 2 Diabetes Mellitus  Initial Endocrine Consultative Visit: 03/08/2019    PATIENT IDENTIFIER: Ms. SHIZA THELEN is a 63 y.o. female with a past medical history of .T2Dm, bipolar disorder,migrane headaches  and dyslipidemia  The patient has followed with Endocrinology clinic since 03/08/2019 for consultative assistance with management of her diabetes.  DIABETIC HISTORY:  Ms. Converse was diagnosed with T2DM in 2018.  She has been tried on oral glycemic agents such as glipizide  and Metformin , she is intolerant to higher doses of Metformin .  She has been on insulin  since 2018.  Her hemoglobin A1c has ranged from  6.7% in 2018, peaking at 14.6% in 2021   On her initial visit to our clinic she had an A1c of 14.6%, she was on metformin  and Lantus . We continued that and started Jardiance . By 09/2020 we provided her with novolog  per correction scale   Started Ozempic    Discontinued Basaglar  09/2022 with an A1c of 6.0% Discontinue metformin  01/2023 due to hypoglycemia and A1c of 6.0%    SUBJECTIVE:   During the last visit (02/07/2023): A1c of 6.0 %.     Today (08/13/2023): Ms. Beaudry is here for follow-up on diabetes management. .She checks her  glucose multiple times daily through CGM. She has had multiple episodes of hypoglycemic episodes, she is symptomatic with these episodes. She does verify with fingersticks with occasional normal BG's  or 60's   She continues to follow-up with vascular surgery for claudications, undergoing medical therapy  Has occasional nausea but no vomiting  Denies constipation but has  diarrhea  that she attributes to gastroenteritis that was contracted from daughter  No genital infections    HOME DIABETES REGIMEN:  Jardiance  25 mg daily  Ozempic   2 mg weekly     Statin: Yes ACE-I/ARB:  No  CONTINUOUS GLUCOSE MONITORING RECORD INTERPRETATION    Dates of Recording: 1/11-1/24/2025  Sensor description:dexcom  Results statistics:   CGM use % of time 68  Average and SD 87/25  Time in range 75 %  % Time Above 180 0  % Time above 250 0  % Time Below target 22   Glycemic patterns summary: BGs are low overnight and optimal during the day  Hypoglycemic episodes occurred during the day and night  Overnight periods: Trends down      DIABETIC COMPLICATIONS: Microvascular complications:  Neuropathy Denies: CKD, retinopathy  Last eye exam: Completed 11/12/2022   Macrovascular complications:   S/P left common femoral endarterectomy with bovine patch on 02/12/2021 Denies: CAD, CVA     HISTORY:  Past Medical History:  Past Medical History:  Diagnosis Date   Allergy    Anemia    Anxiety    Arthritis    Asthma    Back pain    Bipolar disorder (HCC)    CVA (cerebral infarction)    Depression    Diabetes mellitus without complication (HCC)    High cholesterol    Hypertension    Left leg pain    Migraine    Peripheral arterial disease (HCC)    Sleep apnea    does not use a cpap   Stroke (HCC) 2007   no lasting weakness   Past Surgical History:  Past Surgical History:  Procedure Laterality Date   ABDOMINAL AORTOGRAM W/LOWER EXTREMITY N/A 02/08/2021  Procedure: ABDOMINAL AORTOGRAM W/LOWER EXTREMITY;  Surgeon: Gretta Lonni PARAS, MD;  Location: [facility] INVASIVE CV LAB;  Service: Cardiovascular;  Laterality: N/A;   ABDOMINAL HYSTERECTOMY     ANTERIOR LATERAL LUMBAR FUSION WITH PERCUTANEOUS SCREW 1 LEVEL Left 02/11/2018   Procedure: LEFT LATERAL LUMBAR  FOUR-FIVE INTERBODY FUSION WITH INSTRUMENTATION AND ALLOGRAFT;  Surgeon: Beuford Anes, MD;  Location: [facility];  Service: Orthopedics;  Laterality: Left;  LEFT LATERAL LUMBAR  FOUR-FIVE INTERBODY FUSION WITH INSTRUMENTATION AND ALLOGRAFT   BACK SURGERY     Lumbar fusion L5-S1   DIABETES     ENDARTERECTOMY  FEMORAL Left 02/12/2021   Procedure: LEFT COMMON FEMORAL ENDARTERECTOMY WITH BOVINE PATCH;  Surgeon: Gretta Lonni PARAS, MD;  Location: [facility];  Service: Vascular;  Laterality: Left;   EYE SURGERY Bilateral    INGUINAL LYMPH NODE BIOPSY Left 07/25/2021   Procedure: EXCISIONAL BIOPSY LEFT INGUINAL LYMPH NODES;  Surgeon: Vernetta Berg, MD;  Location: [facility];  Service: General;  Laterality: Left;   KNEE SURGERY     l 4-l5 status post lateral posterior fusion January 30,2020     NEUROFORAMINAL STENOSIS Left    Severe L4-L5, involving the level above previous fusion.   PARTIAL HYSTERECTOMY     RADICULOPATHY Left    L4, Secondary to an L4-L5 Spondylolisthesis   SHOULDER SURGERY     Social History:  reports that she quit smoking about 7 years ago. Her smoking use included cigarettes. She has never used smokeless tobacco. She reports that she does not currently use alcohol. She reports that she does not currently use drugs after having used the following drugs: Marijuana. Family History:  Family History  Problem Relation Age of Onset   Diabetes Mother    Stroke Father    Cancer Paternal Aunt    Cancer Paternal Aunt    Diabetes Brother    Colon cancer Neg Hx    Esophageal cancer Neg Hx    Rectal cancer Neg Hx    Stomach cancer Neg Hx      HOME MEDICATIONS: Allergies as of 08/13/2023   No Known Allergies      Medication List        Accurate as of August 13, 2023  7:10 AM. If you have any questions, ask your nurse or doctor.          Accu-Chek FastClix Lancets Misc TEST 4 TIMES DAILY. DX E11.9   Accu-Chek Guide Control Liqd 1 each by In Vitro route every 30 (thirty) days. Needs Accu-check guide monitor system. DX. E11.9 What changed: additional instructions   albuterol  108 (90 Base) MCG/ACT inhaler Commonly known as: VENTOLIN  HFA INHALE 2 PUFFS INTO THE LUNGS DAILY AS NEEDED FOR WHEEZING OR SHORTNESS OF BREATH   albuterol  (2.5 MG/3ML) 0.083% nebulizer  solution Commonly known as: PROVENTIL  Take 2.5 mg by nebulization every 6 (six) hours as needed for wheezing.   aspirin  EC 81 MG tablet Take 1 tablet (81 mg total) by mouth daily at 6 (six) AM. Swallow whole.   blood glucose meter kit and supplies Dispense based on patient and insurance preference. Use up to four times daily as directed. (FOR ICD-10 E10.9, E11.9).   buPROPion  75 MG tablet Commonly known as: WELLBUTRIN  Take 1 tablet (75 mg total) by mouth 2 (two) times daily. Needs appointment with pcp for additional refills   cilostazol  100 MG tablet Commonly known as: PLETAL  Take 1 tablet (100 mg total) by mouth 2 (two) times daily before a meal.  Depakote 250 MG DR tablet Generic drug: divalproex 1 tablet Orally Once a day   diclofenac  Sodium 1 % Gel Commonly known as: Voltaren  Apply 2 g topically 4 (four) times daily.   Dulcolax 5 MG EC tablet Generic drug: bisacodyl  2 tablets Orally twice a day for 1 days   DULoxetine  60 MG capsule Commonly known as: CYMBALTA  TAKE 1 CAPSULE(60 MG) BY MOUTH TWICE DAILY   empagliflozin  25 MG Tabs tablet Commonly known as: Jardiance  Take 1 tablet (25 mg total) by mouth daily.   Exforge  5-320 MG tablet Generic drug: amLODipine -valsartan  1 tablet Orally Once a day for 30 day(s)   ferrous sulfate  325 (65 FE) MG tablet Take 1 tablet (325 mg total) by mouth every other day. What changed: when to take this   fluticasone  50 MCG/ACT nasal spray Commonly known as: FLONASE  Place 2 sprays into both nostrils daily as needed for allergies.   gabapentin  300 MG capsule Commonly known as: NEURONTIN  Take 1 capsule (300 mg total) by mouth 3 (three) times daily.   meloxicam  15 MG tablet Commonly known as: MOBIC  15 mg.   OneTouch Verio test strip Generic drug: glucose blood CHECK UPTO FOUR TIMES DAILY AS DIRECTED   oxyCODONE  5 MG immediate release tablet Commonly known as: Roxicodone  Take 1 tablet (5 mg total) by mouth every 6 (six)  hours as needed for up to 10 doses.   rosuvastatin  40 MG tablet Commonly known as: CRESTOR  TAKE 1 TABLET(40 MG) BY MOUTH AT BEDTIME   Semaglutide  (2 MG/DOSE) 8 MG/3ML Sopn Inject 2 mg as directed once a week.   tiZANidine  4 MG tablet Commonly known as: Zanaflex  Take 1 tablet (4 mg total) by mouth 2 (two) times daily as needed for muscle spasms.   Vitamin D3 1.25 MG (50000 UT) Caps Take 1 capsule by mouth once a week.   Zofran  4 MG tablet Generic drug: ondansetron  1 tablet Orally as needed         OBJECTIVE:   Vital Signs: There were no vitals taken for this visit.  Wt Readings from Last 3 Encounters:  02/23/23 170 lb (77.1 kg)  02/07/23 175 lb (79.4 kg)  12/24/22 170 lb 11.2 oz (77.4 kg)     Exam: General: Pt appears well and is in NAD  Lungs: Clear with good BS bilat   Heart: RRR  Extremities: No pretibial edema.   Neuro: MS is good with appropriate affect, pt is alert and Ox3   DM Foot Exam 09/26/2022    The skin of the feet is intact without sores or ulcerations. The pedal pulses are undetectable  on the left, 2+ on the right  The sensation is intact  to a screening 5.07, 10 gram monofilament bilaterally    DATA REVIEWED:  Lab Results  Component Value Date   HGBA1C 6.0 (A) 02/07/2023   HGBA1C 6.0 (A) 09/26/2022   HGBA1C 6.8 (A) 03/25/2022    Latest Reference Range & Units 11/06/22 10:25  Sodium 135 - 145 mEq/L 140  Potassium 3.5 - 5.1 mEq/L 4.2  Chloride 96 - 112 mEq/L 105  CO2 19 - 32 mEq/L 28  Glucose 70 - 99 mg/dL 69 (L)  BUN 6 - 23 mg/dL 10  Creatinine 9.59 - 8.79 mg/dL 9.25  Calcium  8.4 - 10.5 mg/dL 9.7  Phosphorus 2.3 - 4.6 mg/dL 3.9  Albumin 3.5 - 5.2 g/dL 4.4  GFR >39.99 mL/min 88.10    Latest Reference Range & Units 11/06/22 10:25  Total CHOL/HDL Ratio  2  Cholesterol 0 - 200 mg/dL 899  HDL Cholesterol >60.99 mg/dL 55.69  LDL (calc) 0 - 99 mg/dL 39  NonHDL  44.33  Triglycerides 0.0 - 149.0 mg/dL 17.9  VLDL 0.0 - 59.9 mg/dL  83.5     ASSESSMENT / PLAN / RECOMMENDATIONS:   1) Type 2 Diabetes Mellitus, Optimally  controlled , With neuropathic and macrovascular complications - Most recent A1c of 6.0 %. Goal A1c <7.0%.    -A1c continues to be optimal -She has been noted with hypoglycemia on Dexcom download, the patient does verify some of these episodes with the fingersticks and notes that fingerstick readings are higher than the Dexcom but she also endorses BG readings in the 60s. -I have recommended discontinuing metformin  at this time  MEDICATIONS: Stop metformin  Continue Ozempic  2 mg weekly Continue Jardiance  25 mg daily   EDUCATION / INSTRUCTIONS: BG monitoring instructions: Patient is instructed to check her blood sugars 2 times a day, fasting and bedtime as much as possible Call [place] [facility] if: BG persistently  <70 I reviewed the Rule of 15 for the treatment of hypoglycemia in detail with the patient. Literature supplied.    2) Diabetic complications:  Eye: Does not have known diabetic retinopathy.  Neuro/ Feet: Does have known diabetic peripheral neuropathy. Renal: Patient does not have known baseline CKD. She is not an ACEI/ARB at present     F/U in 6 months    Signed electronically by: Stefano Redgie Butts, MD  [facility]  [facility] [address] [place] [place], [place] [address] Phone: [phone] FAX: [phone]   CC: Katheen Roselie Rockford, NP [address] [place] [address] Phone: [phone]  Fax: [phone]  Return to Endocrinology clinic as below: Future Appointments  Date Time Provider Department Center  08/13/2023 10:50 AM Azreal Stthomas, Donell Redgie, MD [id number] None  08/15/2023 11:20 AM [facility] 2 [facility]  08/20/2023 10:00 AM Nche, Roselie Rockford, NP [facility]

## 2023-08-15 ENCOUNTER — Ambulatory Visit
Admission: RE | Admit: 2023-08-15 | Discharge: 2023-08-15 | Disposition: A | Discharge status: Home / Self Care | Source: Ambulatory Visit | Attending: Nurse Practitioner | Admitting: Nurse Practitioner

## 2023-08-15 DIAGNOSIS — Z1231 Encounter for screening mammogram for malignant neoplasm of breast: Secondary | ICD-10-CM

## 2023-08-20 ENCOUNTER — Ambulatory Visit: Admitting: Internal Medicine

## 2023-08-20 ENCOUNTER — Ambulatory Visit: Admitting: Nurse Practitioner

## 2023-08-22 ENCOUNTER — Ambulatory Visit (INDEPENDENT_AMBULATORY_CARE_PROVIDER_SITE_OTHER): Admitting: Internal Medicine

## 2023-08-22 ENCOUNTER — Encounter: Payer: Self-pay | Admitting: Internal Medicine

## 2023-08-22 ENCOUNTER — Ambulatory Visit: Admitting: Family Medicine

## 2023-08-22 VITALS — BP 116/78 | HR 93 | Ht 61.0 in | Wt 176.0 lb

## 2023-08-22 DIAGNOSIS — E1159 Type 2 diabetes mellitus with other circulatory complications: Secondary | ICD-10-CM

## 2023-08-22 DIAGNOSIS — Z794 Long term (current) use of insulin: Secondary | ICD-10-CM | POA: Diagnosis not present

## 2023-08-22 DIAGNOSIS — E1142 Type 2 diabetes mellitus with diabetic polyneuropathy: Secondary | ICD-10-CM

## 2023-08-22 LAB — POCT GLUCOSE (DEVICE FOR HOME USE): Glucose Fasting, POC: 155 mg/dL — AB (ref 70–99)

## 2023-08-22 LAB — POCT GLYCOSYLATED HEMOGLOBIN (HGB A1C): Hemoglobin A1C: 6 % — AB (ref 4.0–5.6)

## 2023-08-22 NOTE — Patient Instructions (Signed)
-   Continue  Ozempic  2 mg weekly  - Continue Jardiance  to 25 mg , 1 tablet daily     HOW TO TREAT LOW BLOOD SUGARS (Blood sugar LESS THAN 70 MG/DL) Please follow the RULE OF 15 for the treatment of hypoglycemia treatment (when your (blood sugars are less than 70 mg/dL)   STEP 1: Take 15 grams of carbohydrates when your blood sugar is low, which includes:  3-4 GLUCOSE TABS  OR 3-4 OZ OF JUICE OR REGULAR SODA OR ONE TUBE OF GLUCOSE GEL    STEP 2: RECHECK blood sugar in 15 MINUTES STEP 3: If your blood sugar is still low at the 15 minute recheck --> then, go back to STEP 1 and treat AGAIN with another 15 grams of carbohydrates.

## 2023-08-22 NOTE — Progress Notes (Signed)
 Name: Autumn Valenzuela  Age/ Sex: 63 y.o., female   MRN/ DOB: [id number], Jun 20, 1960     PCP: Katheen Roselie Rockford, NP   Reason for Endocrinology Evaluation: Type 2 Diabetes Mellitus  Initial Endocrine Consultative Visit: 03/08/2019    PATIENT IDENTIFIER: Autumn Valenzuela is a 63 y.o. female with a past medical history of .T2Dm, bipolar disorder,migrane headaches  and dyslipidemia  The patient has followed with Endocrinology clinic since 03/08/2019 for consultative assistance with management of her diabetes.  DIABETIC HISTORY:  Autumn Valenzuela was diagnosed with T2DM in 2018.  She has been tried on oral glycemic agents such as glipizide  and Metformin , she is intolerant to higher doses of Metformin .  She has been on insulin  since 2018.  Her hemoglobin A1c has ranged from  6.7% in 2018, peaking at 14.6% in 2021   On her initial visit to our clinic she had an A1c of 14.6%, she was on metformin  and Lantus . We continued that and started Jardiance . By 09/2020 we provided her with novolog  per correction scale   Started Ozempic    Discontinued Basaglar  09/2022 with an A1c of 6.0% Discontinue metformin  01/2023 due to hypoglycemia and A1c of 6.0%    SUBJECTIVE:   During the last visit (02/07/2023): A1c of 6.0 %.     Today (08/22/2023): Autumn Valenzuela is here for follow-up on diabetes management. .She has not been checking glucose ~ 2 weeks ago as she lost the [facility] receiver.   She continues to follow-up with vascular surgery for claudications, undergoing medical therapy  No nausea or vomiting  No constipation or diarrhea    HOME DIABETES REGIMEN:  Jardiance  25 mg daily  Ozempic   2 mg weekly     Statin: Yes ACE-I/ARB: No  CONTINUOUS GLUCOSE MONITORING RECORD INTERPRETATION : n/a      DIABETIC COMPLICATIONS: Microvascular complications:  Neuropathy Denies: CKD, retinopathy  Last eye exam: Completed 11/12/2022   Macrovascular complications:   S/P left common femoral endarterectomy  with bovine patch on 02/12/2021 Denies: CAD, CVA     HISTORY:  Past Medical History:  Past Medical History:  Diagnosis Date   Allergy    Anemia    Anxiety    Arthritis    Asthma    Back pain    Bipolar disorder (HCC)    CVA (cerebral infarction)    Depression    Diabetes mellitus without complication (HCC)    High cholesterol    Hypertension    Left leg pain    Migraine    Peripheral arterial disease (HCC)    Sleep apnea    does not use a cpap   Stroke (HCC) 2007   no lasting weakness   Past Surgical History:  Past Surgical History:  Procedure Laterality Date   ABDOMINAL AORTOGRAM W/LOWER EXTREMITY N/A 02/08/2021   Procedure: ABDOMINAL AORTOGRAM W/LOWER EXTREMITY;  Surgeon: Gretta Lonni PARAS, MD;  Location: [facility];  Service: Cardiovascular;  Laterality: N/A;   ABDOMINAL HYSTERECTOMY     ANTERIOR LATERAL LUMBAR FUSION WITH PERCUTANEOUS SCREW 1 LEVEL Left 02/11/2018   Procedure: LEFT LATERAL LUMBAR  FOUR-FIVE INTERBODY FUSION WITH INSTRUMENTATION AND ALLOGRAFT;  Surgeon: Beuford Anes, MD;  Location: [facility] OR;  Service: Orthopedics;  Laterality: Left;  LEFT LATERAL LUMBAR  FOUR-FIVE INTERBODY FUSION WITH INSTRUMENTATION AND ALLOGRAFT   BACK SURGERY     Lumbar fusion L5-S1   DIABETES     ENDARTERECTOMY FEMORAL Left 02/12/2021   Procedure: LEFT COMMON FEMORAL ENDARTERECTOMY WITH BOVINE PATCH;  Surgeon: Gretta Lonni PARAS, MD;  Location: [facility] OR;  Service: Vascular;  Laterality: Left;   EYE SURGERY Bilateral    INGUINAL LYMPH NODE BIOPSY Left 07/25/2021   Procedure: EXCISIONAL BIOPSY LEFT INGUINAL LYMPH NODES;  Surgeon: Vernetta Berg, MD;  Location:  SURGERY CENTER;  Service: General;  Laterality: Left;   KNEE SURGERY     l 4-l5 status post lateral posterior fusion January 30,2020     NEUROFORAMINAL STENOSIS Left    Severe L4-L5, involving the level above previous fusion.   PARTIAL HYSTERECTOMY     RADICULOPATHY Left    L4, Secondary to an L4-L5  Spondylolisthesis   SHOULDER SURGERY     Social History:  reports that she quit smoking about 7 years ago. Her smoking use included cigarettes. She has never used smokeless tobacco. She reports that she does not currently use alcohol. She reports that she does not currently use drugs after having used the following drugs: Marijuana. Family History:  Family History  Problem Relation Age of Onset   Breast cancer Mother    Diabetes Mother    Stroke Father    Breast cancer Paternal Aunt    Cancer Paternal Aunt    Breast cancer Paternal Aunt    Cancer Paternal Aunt    Diabetes Brother    Colon cancer Neg Hx    Esophageal cancer Neg Hx    Rectal cancer Neg Hx    Stomach cancer Neg Hx      HOME MEDICATIONS: Allergies as of 08/22/2023   No Known Allergies      Medication List        Accurate as of August 22, 2023 10:10 AM. If you have any questions, ask your nurse or doctor.          Accu-Chek FastClix Lancets Misc TEST 4 TIMES DAILY. DX E11.9   Accu-Chek Guide Control Liqd 1 each by In Vitro route every 30 (thirty) days. Needs Accu-check guide monitor system. DX. E11.9 What changed: additional instructions   albuterol  108 (90 Base) MCG/ACT inhaler Commonly known as: VENTOLIN  HFA INHALE 2 PUFFS INTO THE LUNGS DAILY AS NEEDED FOR WHEEZING OR SHORTNESS OF BREATH   albuterol  (2.5 MG/3ML) 0.083% nebulizer solution Commonly known as: PROVENTIL  Take 2.5 mg by nebulization every 6 (six) hours as needed for wheezing.   aspirin  EC 81 MG tablet Take 1 tablet (81 mg total) by mouth daily at 6 (six) AM. Swallow whole.   blood glucose meter kit and supplies Dispense based on patient and insurance preference. Use up to four times daily as directed. (FOR ICD-10 E10.9, E11.9).   buPROPion  75 MG tablet Commonly known as: WELLBUTRIN  Take 1 tablet (75 mg total) by mouth 2 (two) times daily. Needs appointment with pcp for additional refills   cilostazol  100 MG tablet Commonly known  as: PLETAL  Take 1 tablet (100 mg total) by mouth 2 (two) times daily before a meal.   Depakote 250 MG DR tablet Generic drug: divalproex 1 tablet Orally Once a day   diclofenac  Sodium 1 % Gel Commonly known as: Voltaren  Apply 2 g topically 4 (four) times daily.   Dulcolax 5 MG EC tablet Generic drug: bisacodyl  2 tablets Orally twice a day for 1 days   DULoxetine  60 MG capsule Commonly known as: CYMBALTA  TAKE 1 CAPSULE(60 MG) BY MOUTH TWICE DAILY   empagliflozin  25 MG Tabs tablet Commonly known as: Jardiance  Take 1 tablet (25 mg total) by mouth daily.   Exforge  5-320 MG tablet  Generic drug: amLODipine -valsartan  1 tablet Orally Once a day for 30 day(s)   ferrous sulfate  325 (65 FE) MG tablet Take 1 tablet (325 mg total) by mouth every other day. What changed: when to take this   fluticasone  50 MCG/ACT nasal spray Commonly known as: FLONASE  Place 2 sprays into both nostrils daily as needed for allergies.   gabapentin  300 MG capsule Commonly known as: NEURONTIN  Take 1 capsule (300 mg total) by mouth 3 (three) times daily.   meloxicam  15 MG tablet Commonly known as: MOBIC  15 mg.   OneTouch Verio test strip Generic drug: glucose blood CHECK UPTO FOUR TIMES DAILY AS DIRECTED   oxyCODONE  5 MG immediate release tablet Commonly known as: Roxicodone  Take 1 tablet (5 mg total) by mouth every 6 (six) hours as needed for up to 10 doses.   rosuvastatin  40 MG tablet Commonly known as: CRESTOR  TAKE 1 TABLET(40 MG) BY MOUTH AT BEDTIME   Semaglutide  (2 MG/DOSE) 8 MG/3ML Sopn Inject 2 mg as directed once a week.   tiZANidine  4 MG tablet Commonly known as: Zanaflex  Take 1 tablet (4 mg total) by mouth 2 (two) times daily as needed for muscle spasms.   Vitamin D3 1.25 MG (50000 UT) Caps Take 1 capsule by mouth once a week.   Zofran  4 MG tablet Generic drug: ondansetron  1 tablet Orally as needed         OBJECTIVE:   Vital Signs: BP 116/78 (BP Location: Left Arm,  Patient Position: Sitting, Cuff Size: Normal)   Pulse 93   Ht 5' 1 (1.549 m)   Wt 176 lb (79.8 kg)   SpO2 99%   BMI 33.25 kg/m   Wt Readings from Last 3 Encounters:  08/22/23 176 lb (79.8 kg)  02/23/23 170 lb (77.1 kg)  02/07/23 175 lb (79.4 kg)     Exam: General: Pt appears well and is in NAD  Lungs: Clear with good BS bilat   Heart: RRR  Extremities: No pretibial edema.   Neuro: MS is good with appropriate affect, pt is alert and Ox3   DM Foot Exam 08/22/2023    The skin of the feet is intact without sores or ulcerations. The pedal pulses are undetectable  on the left, 1+ on the right  The sensation is intact  to a screening 5.07, 10 gram monofilament bilaterally    DATA REVIEWED:  Lab Results  Component Value Date   HGBA1C 6.0 (A) 08/22/2023   HGBA1C 6.0 (A) 02/07/2023   HGBA1C 6.0 (A) 09/26/2022    Latest Reference Range & Units 11/06/22 10:25  Sodium 135 - 145 mEq/L 140  Potassium 3.5 - 5.1 mEq/L 4.2  Chloride 96 - 112 mEq/L 105  CO2 19 - 32 mEq/L 28  Glucose 70 - 99 mg/dL 69 (L)  BUN 6 - 23 mg/dL 10  Creatinine 9.59 - 8.79 mg/dL 9.25  Calcium  8.4 - 10.5 mg/dL 9.7  Phosphorus 2.3 - 4.6 mg/dL 3.9  Albumin 3.5 - 5.2 g/dL 4.4  GFR >39.99 mL/min 88.10    Latest Reference Range & Units 11/06/22 10:25  Total CHOL/HDL Ratio  2  Cholesterol 0 - 200 mg/dL 899  HDL Cholesterol >60.99 mg/dL 55.69  LDL (calc) 0 - 99 mg/dL 39  NonHDL  44.33  Triglycerides 0.0 - 149.0 mg/dL 17.9  VLDL 0.0 - 59.9 mg/dL 83.5     ASSESSMENT / PLAN / RECOMMENDATIONS:   1) Type 2 Diabetes Mellitus, Optimally  controlled , With neuropathic and macrovascular complications -  Most recent A1c of 6.0 %. Goal A1c <7.0%.    -A1c continues to be optimal -We discontinue metformin  in January, 2025 with an A1c of 6.0% - No changes    MEDICATIONS:  Continue Ozempic  2 mg weekly Continue Jardiance  25 mg daily   EDUCATION / INSTRUCTIONS: BG monitoring instructions: Patient is  instructed to check her blood sugars 2 times a day, fasting and bedtime as much as possible Call [facility] if: BG persistently  <70 I reviewed the Rule of 15 for the treatment of hypoglycemia in detail with the patient. Literature supplied.    2) Diabetic complications:  Eye: Does not have known diabetic retinopathy.  Neuro/ Feet: Does have known diabetic peripheral neuropathy. Renal: Patient does not have known baseline CKD. She is not an ACEI/ARB at present     F/U in 6 months    Signed electronically by: Stefano Redgie Butts, MD  [facility] Endocrinology  [facility] [address] [place], [place] [address] Phone: [phone] FAX: [phone]   CC: Katheen Roselie Rockford, NP [address] [place] [address] Phone: [phone]  Fax: [phone]  Return to Endocrinology clinic as below: No future appointments.

## 2023-08-26 DIAGNOSIS — E1142 Type 2 diabetes mellitus with diabetic polyneuropathy: Secondary | ICD-10-CM | POA: Diagnosis not present

## 2023-08-26 DIAGNOSIS — E1165 Type 2 diabetes mellitus with hyperglycemia: Secondary | ICD-10-CM | POA: Diagnosis not present

## 2023-09-26 ENCOUNTER — Other Ambulatory Visit: Payer: Self-pay

## 2023-09-26 MED ORDER — SEMAGLUTIDE (2 MG/DOSE) 8 MG/3ML ~~LOC~~ SOPN: 2.0000 mg | PEN_INJECTOR | SUBCUTANEOUS | 3 refills | Status: AC

## 2023-11-04 ENCOUNTER — Other Ambulatory Visit: Payer: Self-pay | Admitting: Nurse Practitioner

## 2023-11-04 DIAGNOSIS — F3175 Bipolar disorder, in partial remission, most recent episode depressed: Secondary | ICD-10-CM

## 2023-11-04 NOTE — Telephone Encounter (Signed)
 Called patient and got her scheduled for an office appointment with Roselie on 11/14/23 at 10:20 AM. Explained to patient that I will send a 30 day supply to her pharmacy and the to maintain all appointment as scheduled for the Rx to go back to how Roselie was sending to patient's pharmacy. She verbalized understanding and all (if any) questions were answered.

## 2023-11-06 ENCOUNTER — Other Ambulatory Visit: Payer: Self-pay | Admitting: Internal Medicine

## 2023-11-14 ENCOUNTER — Ambulatory Visit: Payer: Self-pay | Admitting: Nurse Practitioner

## 2023-11-14 ENCOUNTER — Encounter: Payer: Self-pay | Admitting: Nurse Practitioner

## 2023-11-14 ENCOUNTER — Ambulatory Visit (INDEPENDENT_AMBULATORY_CARE_PROVIDER_SITE_OTHER): Admitting: Nurse Practitioner

## 2023-11-14 VITALS — BP 124/76 | HR 83 | Temp 96.2°F | Ht 61.0 in | Wt 172.2 lb

## 2023-11-14 DIAGNOSIS — I10 Essential (primary) hypertension: Secondary | ICD-10-CM

## 2023-11-14 DIAGNOSIS — E1169 Type 2 diabetes mellitus with other specified complication: Secondary | ICD-10-CM | POA: Diagnosis not present

## 2023-11-14 DIAGNOSIS — Z794 Long term (current) use of insulin: Secondary | ICD-10-CM | POA: Diagnosis not present

## 2023-11-14 DIAGNOSIS — E1140 Type 2 diabetes mellitus with diabetic neuropathy, unspecified: Secondary | ICD-10-CM | POA: Diagnosis not present

## 2023-11-14 DIAGNOSIS — F3175 Bipolar disorder, in partial remission, most recent episode depressed: Secondary | ICD-10-CM | POA: Diagnosis not present

## 2023-11-14 DIAGNOSIS — E785 Hyperlipidemia, unspecified: Secondary | ICD-10-CM

## 2023-11-14 DIAGNOSIS — E1149 Type 2 diabetes mellitus with other diabetic neurological complication: Secondary | ICD-10-CM

## 2023-11-14 DIAGNOSIS — R0602 Shortness of breath: Secondary | ICD-10-CM

## 2023-11-14 DIAGNOSIS — Z23 Encounter for immunization: Secondary | ICD-10-CM

## 2023-11-14 LAB — COMPREHENSIVE METABOLIC PANEL WITH GFR
ALT: 10 U/L (ref 0–35)
AST: 14 U/L (ref 0–37)
Albumin: 4.4 g/dL (ref 3.5–5.2)
Alkaline Phosphatase: 60 U/L (ref 39–117)
BUN: 14 mg/dL (ref 6–23)
CO2: 29 meq/L (ref 19–32)
Calcium: 9.6 mg/dL (ref 8.4–10.5)
Chloride: 105 meq/L (ref 96–112)
Creatinine, Ser: 0.8 mg/dL (ref 0.40–1.20)
GFR: 79.66 mL/min (ref >60.00)
Glucose, Bld: 67 mg/dL — ABNORMAL LOW (ref 70–99)
Potassium: 4.7 meq/L (ref 3.5–5.1)
Sodium: 142 meq/L (ref 135–145)
Total Bilirubin: 0.3 mg/dL (ref 0.2–1.2)
Total Protein: 7.6 g/dL (ref 6.0–8.3)

## 2023-11-14 LAB — LIPID PANEL
Cholesterol: 144 mg/dL (ref 0–200)
HDL: 43.6 mg/dL (ref >39.00)
LDL Cholesterol: 79 mg/dL (ref 0–99)
NonHDL: 100.5
Total CHOL/HDL Ratio: 3
Triglycerides: 106 mg/dL (ref 0.0–149.0)
VLDL: 21.2 mg/dL (ref 0.0–40.0)

## 2023-11-14 LAB — MICROALBUMIN / CREATININE URINE RATIO
Creatinine,U: 150.9 mg/dL
Microalb Creat Ratio: 4.8 mg/g (ref 0.0–30.0)
Microalb, Ur: 0.7 mg/dL (ref 0.0–1.9)

## 2023-11-14 MED ORDER — GABAPENTIN 300 MG PO CAPS: 300.0000 mg | ORAL_CAPSULE | Freq: Three times a day (TID) | ORAL | 3 refills | Status: AC

## 2023-11-14 MED ORDER — BUPROPION HCL 75 MG PO TABS: 75.0000 mg | ORAL_TABLET | Freq: Two times a day (BID) | ORAL | 3 refills | Status: AC

## 2023-11-14 MED ORDER — ALBUTEROL SULFATE HFA 108 (90 BASE) MCG/ACT IN AERS
2.0000 | INHALATION_SPRAY | Freq: Every day | RESPIRATORY_TRACT | 0 refills | Status: AC | PRN
Start: 2023-11-14 — End: ?

## 2023-11-14 MED ORDER — DULOXETINE HCL 60 MG PO CPEP: 60.0000 mg | ORAL_CAPSULE | Freq: Two times a day (BID) | ORAL | 3 refills | Status: AC

## 2023-11-14 NOTE — Assessment & Plan Note (Signed)
 BP at goal with exforge  BP Readings from Last 3 Encounters:  11/14/23 124/76  08/22/23 116/78  02/23/23 (!) 149/113   Repeat CMP Maintain med doses

## 2023-11-14 NOTE — Progress Notes (Signed)
 Established Patient Visit  Patient: Autumn Valenzuela   DOB: Oct 02, 1960   63 y.o. Female  MRN: [id number] Visit Date: 11/14/2023  Subjective:    Chief Complaint  Patient presents with   Follow-up    FASTING  Refills needed for Wellbutrin , Duloxetine , Gabapentin , Albuterol     HPI HTN (hypertension) BP at goal with exforge  BP Readings from Last 3 Encounters:  11/14/23 124/76  08/22/23 116/78  02/23/23 (!) 149/113   Repeat CMP Maintain med doses  Hyperlipidemia associated with type 2 diabetes mellitus (HCC) Repeat lipid panel Maintain crestor  dose  DM (diabetes mellitus) (HCC) Check UACr, CMP and lipid panel Under the care of endocrinology  Wt Readings from Last 3 Encounters:  11/14/23 172 lb 3.2 oz (78.1 kg)  08/22/23 176 lb (79.8 kg)  02/23/23 170 lb (77.1 kg)    Reviewed medical, surgical, and social history today  Medications: Outpatient Medications Prior to Visit  Medication Sig   Accu-Chek FastClix Lancets MISC TEST 4 TIMES DAILY. DX E11.9   albuterol  (PROVENTIL ) (2.5 MG/3ML) 0.083% nebulizer solution Take 2.5 mg by nebulization every 6 (six) hours as needed for wheezing.   amLODipine -valsartan  (EXFORGE ) 5-320 MG tablet 1 tablet Orally Once a day for 30 day(s)   aspirin  EC 81 MG EC tablet Take 1 tablet (81 mg total) by mouth daily at 6 (six) AM. Swallow whole.   Blood Glucose Calibration (ACCU-CHEK GUIDE CONTROL) LIQD 1 each by In Vitro route every 30 (thirty) days. Needs Accu-check guide monitor system. DX. E11.9 (Patient taking differently: 1 each by In Vitro route every 30 (thirty) days. Needs Accu-check guide monitor system. DX. E11.9 2 times daily)   blood glucose meter kit and supplies Dispense based on patient and insurance preference. Use up to four times daily as directed. (FOR ICD-10 E10.9, E11.9).   Cholecalciferol  (VITAMIN D3) 1.25 MG (50000 UT) CAPS Take 1 capsule by mouth once a week.   cilostazol  (PLETAL ) 100 MG tablet Take 1 tablet (100  mg total) by mouth 2 (two) times daily before a meal.   diclofenac  Sodium (VOLTAREN ) 1 % GEL Apply 2 g topically 4 (four) times daily.   divalproex (DEPAKOTE) 250 MG DR tablet 1 tablet Orally Once a day   DULCOLAX 5 MG EC tablet 2 tablets Orally twice a day for 1 days   empagliflozin  (JARDIANCE ) 25 MG TABS tablet Take 1 tablet (25 mg total) by mouth daily.   ferrous sulfate  325 (65 FE) MG tablet Take 1 tablet (325 mg total) by mouth every other day. (Patient taking differently: Take 325 mg by mouth daily.)   fluticasone  (FLONASE ) 50 MCG/ACT nasal spray Place 2 sprays into both nostrils daily as needed for allergies.   glucose blood (ONETOUCH VERIO) test strip CHECK UPTO FOUR TIMES DAILY AS DIRECTED   meloxicam  (MOBIC ) 15 MG tablet 15 mg.   ondansetron  (ZOFRAN ) 4 MG tablet 1 tablet Orally as needed   oxyCODONE  (ROXICODONE ) 5 MG immediate release tablet Take 1 tablet (5 mg total) by mouth every 6 (six) hours as needed for up to 10 doses.   rosuvastatin  (CRESTOR ) 40 MG tablet TAKE 1 TABLET(40 MG) BY MOUTH AT BEDTIME   Semaglutide , 2 MG/DOSE, 8 MG/3ML SOPN Inject 2 mg as directed once a week.   tiZANidine  (ZANAFLEX ) 4 MG tablet Take 1 tablet (4 mg total) by mouth 2 (two) times daily as needed for muscle spasms.   [DISCONTINUED] albuterol  (VENTOLIN   HFA) 108 (90 Base) MCG/ACT inhaler INHALE 2 PUFFS INTO THE LUNGS DAILY AS NEEDED FOR WHEEZING OR SHORTNESS OF BREATH   [DISCONTINUED] buPROPion  (WELLBUTRIN ) 75 MG tablet Take 1 tablet (75 mg total) by mouth 2 (two) times daily. Needs appointment with pcp for additional refills   [DISCONTINUED] DULoxetine  (CYMBALTA ) 60 MG capsule Take 1 capsule (60 mg total) by mouth 2 (two) times daily.   [DISCONTINUED] gabapentin  (NEURONTIN ) 300 MG capsule Take 1 capsule (300 mg total) by mouth 3 (three) times daily.   No facility-administered medications prior to visit.   Reviewed past medical and social history.   ROS per HPI above      Objective:  BP 124/76 (BP  Location: Left Arm, Patient Position: Sitting, Cuff Size: Large)   Pulse 83   Temp (!) 96.2 F (35.7 C) (Temporal)   Ht 5' 1 (1.549 m)   Wt 172 lb 3.2 oz (78.1 kg)   SpO2 99%   BMI 32.54 kg/m      Physical Exam Vitals and nursing note reviewed.  Cardiovascular:     Rate and Rhythm: Normal rate and regular rhythm.     Pulses: Normal pulses.     Heart sounds: Normal heart sounds.  Pulmonary:     Effort: Pulmonary effort is normal.     Breath sounds: Normal breath sounds.  Musculoskeletal:     Right lower leg: No edema.     Left lower leg: No edema.  Neurological:     Mental Status: She is alert and oriented to person, place, and time.     Results for orders placed or performed in visit on 11/14/23  Comprehensive metabolic panel with GFR  Result Value Ref Range   Sodium 142 135 - 145 mEq/L   Potassium 4.7 3.5 - 5.1 mEq/L   Chloride 105 96 - 112 mEq/L   CO2 29 19 - 32 mEq/L   Glucose, Bld 67 (L) 70 - 99 mg/dL   BUN 14 6 - 23 mg/dL   Creatinine, Ser 9.19 0.40 - 1.20 mg/dL   Total Bilirubin 0.3 0.2 - 1.2 mg/dL   Alkaline Phosphatase 60 39 - 117 U/L   AST 14 0 - 37 U/L   ALT 10 0 - 35 U/L   Total Protein 7.6 6.0 - 8.3 g/dL   Albumin 4.4 3.5 - 5.2 g/dL   GFR 20.33 >39.99 mL/min   Calcium  9.6 8.4 - 10.5 mg/dL  Lipid panel  Result Value Ref Range   Cholesterol 144 0 - 200 mg/dL   Triglycerides 893.9 0.0 - 149.0 mg/dL   HDL 56.39 >60.99 mg/dL   VLDL 78.7 0.0 - 59.9 mg/dL   LDL Cholesterol 79 0 - 99 mg/dL   Total CHOL/HDL Ratio 3    NonHDL 100.50   Microalbumin / creatinine urine ratio  Result Value Ref Range   Microalb, Ur 0.7 0.0 - 1.9 mg/dL   Creatinine,U 849.0 mg/dL   Microalb Creat Ratio 4.8 0.0 - 30.0 mg/g      Assessment & Plan:    Problem List Items Addressed This Visit     Bipolar disorder (HCC)   Relevant Medications   buPROPion  (WELLBUTRIN ) 75 MG tablet   DULoxetine  (CYMBALTA ) 60 MG capsule   Diabetic neuropathy with neurologic complication (HCC)    Relevant Medications   gabapentin  (NEURONTIN ) 300 MG capsule   DM (diabetes mellitus) (HCC)   Check UACr, CMP and lipid panel Under the care of endocrinology      Relevant Orders  Comprehensive metabolic panel with GFR (Completed)   Microalbumin / creatinine urine ratio (Completed)   HTN (hypertension)   BP at goal with exforge  BP Readings from Last 3 Encounters:  11/14/23 124/76  08/22/23 116/78  02/23/23 (!) 149/113   Repeat CMP Maintain med doses      Relevant Orders   Comprehensive metabolic panel with GFR (Completed)   Hyperlipidemia associated with type 2 diabetes mellitus (HCC)   Repeat lipid panel Maintain crestor  dose      Relevant Orders   Comprehensive metabolic panel with GFR (Completed)   Lipid panel (Completed)   Other Visit Diagnoses       Immunization due    -  Primary   Relevant Orders   Flu vaccine HIGH DOSE PF(Fluzone Trivalent) (Completed)   Pneumococcal conjugate vaccine 20-valent (Prevnar 20) (Completed)     Shortness of breath       Will refill albuterol  inhaler to use every 6 hours as needed   Relevant Medications   albuterol  (VENTOLIN  HFA) 108 (90 Base) MCG/ACT inhaler      Return in about 6 months (around 05/13/2024) for HTN, DM, hyperlipidemia (fasting).     Roselie Mood, NP

## 2023-11-14 NOTE — Assessment & Plan Note (Signed)
 Check UACr, CMP and lipid panel Under the care of endocrinology

## 2023-11-14 NOTE — Assessment & Plan Note (Signed)
 Repeat lipid panel. ?Maintain crestor dose ?

## 2023-11-18 ENCOUNTER — Encounter: Payer: Self-pay | Admitting: Nurse Practitioner

## 2023-11-18 DIAGNOSIS — H04123 Dry eye syndrome of bilateral lacrimal glands: Secondary | ICD-10-CM | POA: Diagnosis not present

## 2023-11-18 DIAGNOSIS — H40013 Open angle with borderline findings, low risk, bilateral: Secondary | ICD-10-CM | POA: Diagnosis not present

## 2023-11-18 DIAGNOSIS — Z961 Presence of intraocular lens: Secondary | ICD-10-CM | POA: Diagnosis not present

## 2023-11-18 DIAGNOSIS — H10413 Chronic giant papillary conjunctivitis, bilateral: Secondary | ICD-10-CM | POA: Diagnosis not present

## 2023-11-18 DIAGNOSIS — H11122 Conjunctival concretions, left eye: Secondary | ICD-10-CM | POA: Diagnosis not present

## 2023-11-18 DIAGNOSIS — H26491 Other secondary cataract, right eye: Secondary | ICD-10-CM | POA: Diagnosis not present

## 2023-11-18 LAB — HM DIABETES EYE EXAM

## 2023-11-24 DIAGNOSIS — E1165 Type 2 diabetes mellitus with hyperglycemia: Secondary | ICD-10-CM | POA: Diagnosis not present

## 2023-11-24 DIAGNOSIS — E1142 Type 2 diabetes mellitus with diabetic polyneuropathy: Secondary | ICD-10-CM | POA: Diagnosis not present

## 2024-01-05 ENCOUNTER — Other Ambulatory Visit: Payer: Self-pay | Admitting: Internal Medicine

## 2024-01-30 NOTE — Progress Notes (Signed)
 Autumn Valenzuela                                          MRN: [id number]   01/30/2024   The [facility] Quality Team Specialist reviewed this patient medical record for the purposes of chart review for care gap closure. The following were reviewed: abstraction for care gap closure-glycemic status assessment.    [facility] Quality Team

## 2024-02-17 ENCOUNTER — Ambulatory Visit

## 2024-02-20 ENCOUNTER — Ambulatory Visit: Admitting: Internal Medicine

## 2024-02-23 ENCOUNTER — Ambulatory Visit

## 2024-04-21 ENCOUNTER — Ambulatory Visit: Admitting: Internal Medicine
